# Patient Record
Sex: Female | Born: 1935 | Race: White | Hispanic: No | Smoking: Former smoker
Health system: Southern US, Community
[De-identification: ages and names within clinical notes are randomized; demographics above are authoritative.]

## PROBLEM LIST (undated history)

## (undated) DIAGNOSIS — E119 Type 2 diabetes mellitus without complications: Secondary | ICD-10-CM

## (undated) DIAGNOSIS — S72141A Displaced intertrochanteric fracture of right femur, initial encounter for closed fracture: Secondary | ICD-10-CM

## (undated) DIAGNOSIS — I340 Nonrheumatic mitral (valve) insufficiency: Secondary | ICD-10-CM

## (undated) DIAGNOSIS — R001 Bradycardia, unspecified: Secondary | ICD-10-CM

## (undated) DIAGNOSIS — T8859XA Other complications of anesthesia, initial encounter: Secondary | ICD-10-CM

## (undated) DIAGNOSIS — I714 Abdominal aortic aneurysm, without rupture, unspecified: Secondary | ICD-10-CM

## (undated) DIAGNOSIS — I2510 Atherosclerotic heart disease of native coronary artery without angina pectoris: Secondary | ICD-10-CM

## (undated) DIAGNOSIS — R55 Syncope and collapse: Secondary | ICD-10-CM

## (undated) DIAGNOSIS — L97909 Non-pressure chronic ulcer of unspecified part of unspecified lower leg with unspecified severity: Secondary | ICD-10-CM

## (undated) DIAGNOSIS — J449 Chronic obstructive pulmonary disease, unspecified: Secondary | ICD-10-CM

## (undated) DIAGNOSIS — Z720 Tobacco use: Secondary | ICD-10-CM

## (undated) DIAGNOSIS — I719 Aortic aneurysm of unspecified site, without rupture: Secondary | ICD-10-CM

## (undated) DIAGNOSIS — T4145XA Adverse effect of unspecified anesthetic, initial encounter: Secondary | ICD-10-CM

## (undated) DIAGNOSIS — T148XXA Other injury of unspecified body region, initial encounter: Secondary | ICD-10-CM

## (undated) DIAGNOSIS — K922 Gastrointestinal hemorrhage, unspecified: Secondary | ICD-10-CM

## (undated) DIAGNOSIS — E871 Hypo-osmolality and hyponatremia: Secondary | ICD-10-CM

## (undated) DIAGNOSIS — Z951 Presence of aortocoronary bypass graft: Secondary | ICD-10-CM

## (undated) DIAGNOSIS — I5030 Unspecified diastolic (congestive) heart failure: Secondary | ICD-10-CM

## (undated) DIAGNOSIS — I10 Essential (primary) hypertension: Secondary | ICD-10-CM

## (undated) DIAGNOSIS — I878 Other specified disorders of veins: Secondary | ICD-10-CM

## (undated) DIAGNOSIS — I482 Chronic atrial fibrillation, unspecified: Secondary | ICD-10-CM

## (undated) DIAGNOSIS — A159 Respiratory tuberculosis unspecified: Secondary | ICD-10-CM

## (undated) DIAGNOSIS — I739 Peripheral vascular disease, unspecified: Secondary | ICD-10-CM

## (undated) DIAGNOSIS — E785 Hyperlipidemia, unspecified: Secondary | ICD-10-CM

## (undated) DIAGNOSIS — I779 Disorder of arteries and arterioles, unspecified: Secondary | ICD-10-CM

## (undated) HISTORY — DX: Aortic aneurysm of unspecified site, without rupture: I71.9

## (undated) HISTORY — DX: Unspecified diastolic (congestive) heart failure: I50.30

## (undated) HISTORY — DX: Essential (primary) hypertension: I10

## (undated) HISTORY — DX: Non-pressure chronic ulcer of unspecified part of unspecified lower leg with unspecified severity: L97.909

## (undated) HISTORY — PX: OTHER SURGICAL HISTORY: SHX169

## (undated) HISTORY — DX: Tobacco use: Z72.0

## (undated) HISTORY — DX: Presence of aortocoronary bypass graft: Z95.1

## (undated) HISTORY — DX: Disorder of arteries and arterioles, unspecified: I77.9

## (undated) HISTORY — DX: Respiratory tuberculosis unspecified: A15.9

## (undated) HISTORY — DX: Hypo-osmolality and hyponatremia: E87.1

## (undated) HISTORY — DX: Bradycardia, unspecified: R00.1

## (undated) HISTORY — DX: Hyperlipidemia, unspecified: E78.5

## (undated) HISTORY — DX: Nonrheumatic mitral (valve) insufficiency: I34.0

## (undated) HISTORY — DX: Syncope and collapse: R55

## (undated) HISTORY — PX: CHOLECYSTECTOMY: SHX55

## (undated) HISTORY — DX: Atherosclerotic heart disease of native coronary artery without angina pectoris: I25.10

## (undated) HISTORY — PX: ABDOMINAL HYSTERECTOMY: SHX81

## (undated) HISTORY — DX: Chronic obstructive pulmonary disease, unspecified: J44.9

## (undated) HISTORY — DX: Gastrointestinal hemorrhage, unspecified: K92.2

## (undated) HISTORY — DX: Other injury of unspecified body region, initial encounter: T14.8XXA

## (undated) HISTORY — DX: Peripheral vascular disease, unspecified: I73.9

---

## 1999-06-18 ENCOUNTER — Ambulatory Visit (HOSPITAL_COMMUNITY): Admission: RE | Admit: 1999-06-18 | Discharge: 1999-06-18 | Payer: Self-pay | Admitting: Gastroenterology

## 2002-12-25 ENCOUNTER — Inpatient Hospital Stay (HOSPITAL_COMMUNITY): Admission: EM | Admit: 2002-12-25 | Discharge: 2002-12-31 | Payer: Self-pay | Admitting: Emergency Medicine

## 2002-12-25 ENCOUNTER — Encounter: Payer: Self-pay | Admitting: Emergency Medicine

## 2002-12-27 ENCOUNTER — Encounter: Payer: Self-pay | Admitting: Cardiology

## 2003-04-02 ENCOUNTER — Ambulatory Visit (HOSPITAL_COMMUNITY): Admission: RE | Admit: 2003-04-02 | Discharge: 2003-04-02 | Payer: Self-pay | Admitting: Gastroenterology

## 2003-04-28 HISTORY — PX: CORONARY ARTERY BYPASS GRAFT: SHX141

## 2003-05-18 ENCOUNTER — Inpatient Hospital Stay (HOSPITAL_COMMUNITY): Admission: EM | Admit: 2003-05-18 | Discharge: 2003-06-02 | Payer: Self-pay | Admitting: Emergency Medicine

## 2003-08-21 ENCOUNTER — Inpatient Hospital Stay (HOSPITAL_BASED_OUTPATIENT_CLINIC_OR_DEPARTMENT_OTHER): Admission: RE | Admit: 2003-08-21 | Discharge: 2003-08-21 | Payer: Self-pay | Admitting: Cardiology

## 2003-08-27 ENCOUNTER — Ambulatory Visit (HOSPITAL_COMMUNITY): Admission: RE | Admit: 2003-08-27 | Discharge: 2003-08-27 | Payer: Self-pay | Admitting: Cardiology

## 2003-10-15 ENCOUNTER — Ambulatory Visit (HOSPITAL_COMMUNITY): Admission: RE | Admit: 2003-10-15 | Discharge: 2003-10-15 | Payer: Self-pay | Admitting: Family Medicine

## 2004-04-07 ENCOUNTER — Inpatient Hospital Stay (HOSPITAL_COMMUNITY): Admission: EM | Admit: 2004-04-07 | Discharge: 2004-04-08 | Payer: Self-pay | Admitting: Emergency Medicine

## 2004-04-07 ENCOUNTER — Ambulatory Visit: Payer: Self-pay | Admitting: Cardiology

## 2004-04-23 ENCOUNTER — Ambulatory Visit: Payer: Self-pay

## 2004-05-05 ENCOUNTER — Ambulatory Visit: Payer: Self-pay | Admitting: Cardiology

## 2005-01-14 ENCOUNTER — Ambulatory Visit: Payer: Self-pay

## 2005-06-03 ENCOUNTER — Ambulatory Visit: Payer: Self-pay | Admitting: Cardiology

## 2005-06-14 ENCOUNTER — Ambulatory Visit: Payer: Self-pay | Admitting: Cardiology

## 2005-06-15 ENCOUNTER — Inpatient Hospital Stay (HOSPITAL_COMMUNITY): Admission: EM | Admit: 2005-06-15 | Discharge: 2005-06-15 | Payer: Self-pay | Admitting: Emergency Medicine

## 2005-06-19 ENCOUNTER — Ambulatory Visit: Payer: Self-pay

## 2005-06-19 ENCOUNTER — Ambulatory Visit: Payer: Self-pay | Admitting: Cardiology

## 2005-06-30 ENCOUNTER — Ambulatory Visit: Payer: Self-pay | Admitting: Cardiology

## 2006-01-20 ENCOUNTER — Ambulatory Visit: Payer: Self-pay

## 2006-04-27 DIAGNOSIS — K922 Gastrointestinal hemorrhage, unspecified: Secondary | ICD-10-CM

## 2006-04-27 HISTORY — DX: Gastrointestinal hemorrhage, unspecified: K92.2

## 2006-08-17 ENCOUNTER — Ambulatory Visit: Payer: Self-pay | Admitting: Cardiology

## 2006-10-28 ENCOUNTER — Ambulatory Visit: Payer: Self-pay | Admitting: Gastroenterology

## 2006-11-11 ENCOUNTER — Ambulatory Visit: Payer: Self-pay | Admitting: Gastroenterology

## 2006-12-30 ENCOUNTER — Ambulatory Visit: Payer: Self-pay

## 2006-12-30 ENCOUNTER — Ambulatory Visit: Payer: Self-pay | Admitting: Cardiology

## 2007-01-05 ENCOUNTER — Encounter: Admission: RE | Admit: 2007-01-05 | Discharge: 2007-01-05 | Payer: Self-pay | Admitting: Family Medicine

## 2007-01-06 ENCOUNTER — Encounter: Admission: RE | Admit: 2007-01-06 | Discharge: 2007-01-06 | Payer: Self-pay | Admitting: Family Medicine

## 2007-01-23 ENCOUNTER — Inpatient Hospital Stay (HOSPITAL_COMMUNITY): Admission: EM | Admit: 2007-01-23 | Discharge: 2007-01-24 | Payer: Self-pay | Admitting: Emergency Medicine

## 2007-01-23 ENCOUNTER — Ambulatory Visit: Payer: Self-pay | Admitting: Cardiology

## 2007-01-28 ENCOUNTER — Ambulatory Visit: Payer: Self-pay

## 2007-02-02 ENCOUNTER — Ambulatory Visit: Payer: Self-pay | Admitting: Internal Medicine

## 2007-02-04 ENCOUNTER — Encounter (INDEPENDENT_AMBULATORY_CARE_PROVIDER_SITE_OTHER): Payer: Self-pay | Admitting: General Surgery

## 2007-02-05 ENCOUNTER — Inpatient Hospital Stay (HOSPITAL_COMMUNITY): Admission: RE | Admit: 2007-02-05 | Discharge: 2007-02-09 | Payer: Self-pay | Admitting: General Surgery

## 2007-02-15 ENCOUNTER — Ambulatory Visit: Payer: Self-pay | Admitting: Gastroenterology

## 2007-08-20 ENCOUNTER — Ambulatory Visit: Payer: Self-pay | Admitting: Internal Medicine

## 2007-08-20 ENCOUNTER — Inpatient Hospital Stay (HOSPITAL_COMMUNITY): Admission: EM | Admit: 2007-08-20 | Discharge: 2007-08-23 | Payer: Self-pay | Admitting: Emergency Medicine

## 2007-08-29 ENCOUNTER — Ambulatory Visit: Payer: Self-pay | Admitting: Cardiology

## 2007-08-29 LAB — CONVERTED CEMR LAB
BUN: 10 mg/dL (ref 6–23)
CO2: 30 meq/L (ref 19–32)
Calcium: 9.9 mg/dL (ref 8.4–10.5)
Chloride: 91 meq/L — ABNORMAL LOW (ref 96–112)
Creatinine, Ser: 0.8 mg/dL (ref 0.4–1.2)
GFR calc Af Amer: 90 mL/min
GFR calc non Af Amer: 74 mL/min
Glucose, Bld: 94 mg/dL (ref 70–99)
Potassium: 4.3 meq/L (ref 3.5–5.1)
Sodium: 129 meq/L — ABNORMAL LOW (ref 135–145)

## 2007-10-13 ENCOUNTER — Ambulatory Visit: Payer: Self-pay | Admitting: Cardiology

## 2007-10-13 LAB — CONVERTED CEMR LAB
BUN: 14 mg/dL (ref 6–23)
CO2: 31 meq/L (ref 19–32)
Calcium: 10.1 mg/dL (ref 8.4–10.5)
Chloride: 97 meq/L (ref 96–112)
Creatinine, Ser: 0.7 mg/dL (ref 0.4–1.2)
GFR calc Af Amer: 105 mL/min
GFR calc non Af Amer: 86 mL/min
Glucose, Bld: 99 mg/dL (ref 70–99)
Potassium: 3.3 meq/L — ABNORMAL LOW (ref 3.5–5.1)
Sodium: 135 meq/L (ref 135–145)

## 2007-12-21 ENCOUNTER — Ambulatory Visit: Payer: Self-pay | Admitting: Cardiology

## 2008-01-27 ENCOUNTER — Ambulatory Visit: Payer: Self-pay

## 2008-02-08 ENCOUNTER — Ambulatory Visit: Payer: Self-pay | Admitting: Cardiology

## 2008-04-04 ENCOUNTER — Ambulatory Visit: Payer: Self-pay | Admitting: Cardiology

## 2008-10-24 ENCOUNTER — Encounter: Payer: Self-pay | Admitting: Cardiology

## 2008-10-25 ENCOUNTER — Ambulatory Visit: Payer: Self-pay | Admitting: Cardiology

## 2008-11-08 ENCOUNTER — Ambulatory Visit: Payer: Self-pay

## 2008-11-08 ENCOUNTER — Encounter: Payer: Self-pay | Admitting: Cardiology

## 2009-01-31 ENCOUNTER — Ambulatory Visit: Payer: Self-pay

## 2009-01-31 ENCOUNTER — Encounter: Payer: Self-pay | Admitting: Cardiology

## 2009-04-28 ENCOUNTER — Encounter: Payer: Self-pay | Admitting: Cardiology

## 2009-04-29 ENCOUNTER — Ambulatory Visit: Payer: Self-pay | Admitting: Cardiology

## 2009-08-02 ENCOUNTER — Ambulatory Visit: Payer: Self-pay | Admitting: Pulmonary Disease

## 2009-08-02 DIAGNOSIS — Z9981 Dependence on supplemental oxygen: Secondary | ICD-10-CM

## 2009-08-05 ENCOUNTER — Encounter: Payer: Self-pay | Admitting: Pulmonary Disease

## 2009-08-09 ENCOUNTER — Encounter: Payer: Self-pay | Admitting: Pulmonary Disease

## 2009-08-19 ENCOUNTER — Ambulatory Visit: Payer: Self-pay | Admitting: Pulmonary Disease

## 2009-09-03 ENCOUNTER — Ambulatory Visit: Payer: Self-pay | Admitting: Pulmonary Disease

## 2009-11-07 ENCOUNTER — Ambulatory Visit: Payer: Self-pay | Admitting: Cardiology

## 2009-11-12 ENCOUNTER — Telehealth: Payer: Self-pay | Admitting: Cardiology

## 2009-11-12 LAB — CONVERTED CEMR LAB
BUN: 14 mg/dL (ref 6–23)
Basophils Absolute: 0 10*3/uL (ref 0.0–0.1)
Basophils Relative: 0.6 % (ref 0.0–3.0)
CO2: 34 meq/L — ABNORMAL HIGH (ref 19–32)
Calcium: 10.1 mg/dL (ref 8.4–10.5)
Chloride: 88 meq/L — ABNORMAL LOW (ref 96–112)
Creatinine, Ser: 0.8 mg/dL (ref 0.4–1.2)
Eosinophils Absolute: 0.3 10*3/uL (ref 0.0–0.7)
Eosinophils Relative: 3.3 % (ref 0.0–5.0)
GFR calc non Af Amer: 75.88 mL/min (ref >60)
Glucose, Bld: 85 mg/dL (ref 70–99)
HCT: 38 % (ref 36.0–46.0)
Hemoglobin: 13.2 g/dL (ref 12.0–15.0)
Lymphocytes Relative: 14.1 % (ref 12.0–46.0)
Lymphs Abs: 1.1 10*3/uL (ref 0.7–4.0)
MCHC: 34.8 g/dL (ref 30.0–36.0)
MCV: 91.3 fL (ref 78.0–100.0)
Monocytes Absolute: 0.8 10*3/uL (ref 0.1–1.0)
Monocytes Relative: 9.4 % (ref 3.0–12.0)
Neutro Abs: 5.8 10*3/uL (ref 1.4–7.7)
Neutrophils Relative %: 72.6 % (ref 43.0–77.0)
Platelets: 232 10*3/uL (ref 150.0–400.0)
Potassium: 3.6 meq/L (ref 3.5–5.1)
RBC: 4.16 M/uL (ref 3.87–5.11)
RDW: 13.6 % (ref 11.5–14.6)
Sodium: 128 meq/L — ABNORMAL LOW (ref 135–145)
WBC: 8 10*3/uL (ref 4.5–10.5)

## 2010-01-01 ENCOUNTER — Ambulatory Visit: Payer: Self-pay | Admitting: Pulmonary Disease

## 2010-01-10 ENCOUNTER — Telehealth: Payer: Self-pay | Admitting: Adult Health

## 2010-02-06 ENCOUNTER — Encounter: Payer: Self-pay | Admitting: Cardiology

## 2010-02-07 ENCOUNTER — Encounter: Payer: Self-pay | Admitting: Cardiology

## 2010-02-07 ENCOUNTER — Ambulatory Visit: Payer: Self-pay

## 2010-04-30 ENCOUNTER — Encounter: Payer: Self-pay | Admitting: Cardiology

## 2010-05-01 ENCOUNTER — Telehealth: Payer: Self-pay | Admitting: Cardiology

## 2010-05-01 ENCOUNTER — Ambulatory Visit
Admission: RE | Admit: 2010-05-01 | Discharge: 2010-05-01 | Payer: Self-pay | Source: Home / Self Care | Attending: Cardiology | Admitting: Cardiology

## 2010-05-02 ENCOUNTER — Ambulatory Visit
Admission: RE | Admit: 2010-05-02 | Discharge: 2010-05-02 | Payer: Self-pay | Source: Home / Self Care | Attending: Pulmonary Disease | Admitting: Pulmonary Disease

## 2010-05-27 NOTE — Miscellaneous (Signed)
  Clinical Lists Changes  Medications: Changed medication from ICAPS MV  TABS (MULTIPLE VITAMINS-MINERALS) Take one tablet by mouth twice daily. to OCUVITE  TABS (MULTIPLE VITAMINS-MINERALS) Take 1 tablet by mouth once a day - Signed Changed medication from METOPROLOL TARTRATE 100 MG TABS (METOPROLOL TARTRATE) Take one tablet by mouth twice a day to METOPROLOL TARTRATE 100 MG TABS (METOPROLOL TARTRATE) Take 2 tablet by mouth two times a day - Signed Added new medication of HYDROCHLOROTHIAZIDE 50 MG TABS (HYDROCHLOROTHIAZIDE) Take 1/2 tablet by mouth once a day - Signed Added new medication of MULTIVITAMINS   TABS (MULTIPLE VITAMIN) Take 1 tablet by mouth once a day - Signed Added new medication of FERROUS SULFATE 325 (65 FE) MG TABS (FERROUS SULFATE) Take 1 tablet by mouth once a day - Signed Removed medication of PROMETHAZINE HCL 25 MG TABS (PROMETHAZINE HCL) as needed Added new medication of * IPRATROPIUM BROMIDE NASAL SOLUTION 2 sprays as needed

## 2010-05-27 NOTE — Assessment & Plan Note (Signed)
Summary: 4 months/apc   Copy to:  Self referred Primary Provider/Referring Provider:  Windle Guard, MD  CC:  follow up .  History of Present Illness: 80/f, smoker presents for management of COPD. She has been on home O2 since dec 2010 ([facility]) after a chest cold requiring prednisone, CXR then showed hyperinflation. She smokes 1/2 PPD, about 22 Pyrs - chantix made her sick & she is afraid of cardiac side effects with patches. She sees Dr Myrtis Ser for CAD & has tolerated lsisnopril & metoprolol, echo 7/10 showed nml LV fn & non dilated RV. An episode of syncope in 2009 was attributed to NTG & diuretics. She reports clear phlegm, nasal drip & sneezing - no seasonal allergies, nasal atrovent dries her out. She has lt >> Rt pedal edema Desaturated to 86% onRA with minimal ambulation. reviewed PFTs 08/19/09 >> severe airway obstruction, FEV1 47%, no BD response, air trapping +, severe decrease in diffusion  Sep 03, 2009 2:00 PM  smokes 1/2 PPD , symbicort may have helped some. -compliant with O2  January 01, 2010 --Presents for 4 mth rov per Dr Vassie Loll -  Continues on Symbicort and Spiriva. Has cut back on cigs-8 /daily. No flare in breathing. Has daily cough mainly dry, worse in am. No discolored mucus, no wheezing. Has good/and bad days. Denies chest pain, , orthopnea, hemoptysis, fever, n/v/d, edema, headache.   Current Medications (verified): 1)  Nitroglycerin 0.4 Mg Subl (Nitroglycerin) .... Place 1 Tablet Under Tongue As Directed 2)  Ocuvite  Tabs (Multiple Vitamins-Minerals) .... Take 1 Tablet By Mouth Once A Day 3)  Aspirin 81 Mg Tbec (Aspirin) .... Take One Tablet By Mouth Daily 4)  Calcium-Carb 600 + D 600-125 Mg-Unit Tabs (Calcium-Vitamin D) .... Take One Tablet By Mouth Once Daily. 5)  Proventil Hfa 108 (90 Base) Mcg/act Aers (Albuterol Sulfate) .... Uad Prn 6)  Spiriva Handihaler 18 Mcg Caps (Tiotropium Bromide Monohydrate) .... Uad Prn 7)  Vitamin D (Ergocalciferol) 50000 Unit Caps  (Ergocalciferol) .... Q Weekly 8)  Verapamil Hcl Cr 180 Mg Xr24h-Cap (Verapamil Hcl) .... Take One Capsule By Mouth Twice A Day 9)  Lisinopril 40 Mg Tabs (Lisinopril) .... Take One Tablet By Mouth Daily 10)  Metoprolol Tartrate 100 Mg Tabs (Metoprolol Tartrate) .... Take 2 Tablet By Mouth Two Times A Day 11)  Potassium Chloride Crys Cr 20 Meq Cr-Tabs (Potassium Chloride Crys Cr) .... Take One Tablet By Mouth Twice A Day 12)  Miralax  Powd (Polyethylene Glycol 3350) .... Uad Prn 13)  Ibuprofen 200 Mg Tabs (Ibuprofen) .... As Needed 14)  Magnesium Oxide 420 Mg Tabs (Magnesium Oxide) .... Once Daily 15)  Lovastatin 40 Mg Tabs (Lovastatin) .... Take 1/2 Tablet By Mouth Once A Day 16)  O2 2 L/min - [facility] 17)  Hydrochlorothiazide 50 Mg Tabs (Hydrochlorothiazide) .... Take 1/2 Tablet By Mouth Once A Day 18)  Multivitamins   Tabs (Multiple Vitamin) .... Take 1 Tablet By Mouth Once A Day 19)  Ferrous Sulfate 325 (65 Fe) Mg Tabs (Ferrous Sulfate) .... Take 1 Tablet By Mouth Once A Day 20)  Ipratropium Bromide Nasal Solution .... 2 Sprays As Needed 21)  Nicotrol 10 Mg Inha (Nicotine) .... Use As Directed 22)  Symbicort 160-4.5 Mcg/act Aero (Budesonide-Formoterol Fumarate) .... Take 2 Puffs Two Times A Day and Rinse Mouth After Each Use  Allergies (verified): 1)  ! Penicillin 2)  ! Codeine 3)  ! Zantac  Comments:  Nurse/Medical Assistant: The patient's medications and allergies were reviewed  with the patient and were updated in the Medication and Allergy Lists.  Past History:  Past Medical History: Last updated: 11/07/2009 CAD CABG 2005 EF 60-65%...echo.Marland Kitchen.10/2008 Aortic valve sclerosis, but no stenosis Hypertension Hyperlipidemia G I Bleed  (AVMs) Aneurysm-Thoracic Aortic (Ascending) (There was question of this in the past.  Echo.. July, 2010... reveals no evidence of significant dilatation of the aorta.... no further workup needed) Syncope (NTG & diuretic) Hyponatremia   COPD Smoking bradycardia Carotid artery disease...doppler 01/2009...0-39% RICA, 40-59% LICA Cholecystectomy Syncope.Marland KitchenMarland Kitchen4/2009.Marland Kitchen.?diuretics and Ntg and bradycardia ? Hyponatremia **Exposed to Tuberculosis in the 1970's, was tested negative by [facility]  Past Surgical History: Last updated: 08/02/2009 Carotid Endarterectomy Cholecystectomy Hysterectomy  Family History: Last updated: 08/02/2009 CHF-mother Family History Lung Cancer-father (non-smoker), sister Family History Prostate Cancer-brother Family History Breast Cancer-sister  Social History: Last updated: 08/02/2009 Marital Status: Widowed, lives alone Children: yes, 1 living Occupation: Retired from [employer] Patient is a current smoker. (1/2 ppd)  Review of Systems      See HPI  Vital Signs:  Patient profile:   77 year old female Weight:      183.13 pounds O2 Sat:      96 % on 2 L/min Temp:     97.7 degrees F oral Pulse rate:   52 / minute BP sitting:   124 / 62  (left arm) Cuff size:   regular  Vitals Entered By: Abigail Miyamoto RN (January 01, 2010 11:33 AM)  O2 Flow:  2 L/min  Physical Exam  Additional Exam:  Gen. Pleasant, thin woman, in no distress, normal affect ENT - no lesions, no post nasal drip Neck: No JVD, no thyromegaly, no carotid bruits Lungs: no use of accessory muscles, no dullness to percussion, decreased BL without rales or rhonchi  Cardiovascular: Rhythm regular, heart sounds  normal, no murmurs or gallops, 1+ peripheral edema Musculoskeletal: No deformities, no cyanosis or clubbing      Impression & Recommendations:  Problem # 1:  COPD (ICD-496)  Please schedule a follow-up appointment in 4 months with Dr. Vassie Loll  Keep trying to cut down with patches & nicotine inhaler Continue on Spiriva and Symbicort  Please contact office for sooner follow up as needed  Flu shot  Her updated medication list for this problem includes:    Nicotrol 10 Mg Inha (Nicotine) ..... Use as  directed  Orders: Est. Patient Level III (16109)  Medications Added to Medication List This Visit: 1)  Symbicort 160-4.5 Mcg/act Aero (Budesonide-formoterol fumarate) .... Take 2 puffs two times a day and rinse mouth after each use  Other Orders: Flu Vaccine 48yrs + (60454) Administration Flu vaccine - MCR (U9811)  Patient Instructions: 1)  Please schedule a follow-up appointment in 4 months with Dr. Vassie Loll  2)  Keep trying to cut down with patches & nicotine inhaler 3)  Continue on Spiriva and Symbicort  4)  Please contact office for sooner follow up as needed  5)  Flu shot  Prescriptions: PROVENTIL HFA 108 (90 BASE) MCG/ACT AERS (ALBUTEROL SULFATE) UAD prn  #3 x 3   Entered and Authorized by:   Rubye Oaks NP   Signed by:   Tammy Parrett NP on 01/01/2010   Method used:   Faxed to ...       [facility] (mail-order)             , [place]         Ph:        Fax: [phone]   RxID:  [id number]   Flu Vaccine Consent Questions     Do you have a history of severe allergic reactions to this vaccine? no    Any prior history of allergic reactions to egg and/or gelatin? no    Do you have a sensitivity to the preservative Thimersol? no    Do you have a past history of Guillan-Barre Syndrome? no    Do you currently have an acute febrile illness? no    Have you ever had a severe reaction to latex? no    Vaccine information given and explained to patient? yes    Are you currently pregnant? no    Lot Number:[id number]   Exp Date:10/24/2009   Site Given  Left Deltoid IMDenise Doroteo Glassman RN  January 01, 2010 11:45 AM      .lbmedflu

## 2010-05-27 NOTE — Miscellaneous (Signed)
Summary: Orders Update pft charges  Clinical Lists Changes  Orders: Added new Service order of Carbon Monoxide diffusing w/capacity (94720) - Signed Added new Service order of Lung Volumes (94240) - Signed Added new Service order of Spirometry (Pre & Post) (94060) - Signed 

## 2010-05-27 NOTE — Assessment & Plan Note (Signed)
Summary: [name]   Visit Type:  Follow-up Primary [name]:  Windle Guard, MD  CC:  CAD.  History of Present Illness: Patient is seen for followup of coronary disease.  I saw her last January, 2011.  She underwent CABG in 2005.  Ejection fraction was 60% in 2010.  She's not having any chest pain.  She is wearing her home O2.  She continues to smoke a small amount.  Current Medications (verified): 1)  Nitroglycerin 0.4 Mg Subl (Nitroglycerin) .... Place 1 Tablet Under Tongue As Directed 2)  Ocuvite  Tabs (Multiple Vitamins-Minerals) .... Take 1 Tablet By Mouth Once A Day 3)  Aspirin 81 Mg Tbec (Aspirin) .... Take One Tablet By Mouth Daily 4)  Calcium-Carb 600 + D 600-125 Mg-Unit Tabs (Calcium-Vitamin D) .... Take One Tablet By Mouth Once Daily. 5)  Proventil Hfa 108 (90 Base) Mcg/act Aers (Albuterol Sulfate) .... Uad Prn 6)  Spiriva Handihaler 18 Mcg Caps (Tiotropium Bromide Monohydrate) .... Uad Prn 7)  Vitamin D (Ergocalciferol) 50000 Unit Caps (Ergocalciferol) .... Q Weekly 8)  Verapamil Hcl Cr 180 Mg Xr24h-Cap (Verapamil Hcl) .... Take One Capsule By Mouth Twice A Day 9)  Lisinopril 40 Mg Tabs (Lisinopril) .... Take One Tablet By Mouth Daily 10)  Metoprolol Tartrate 100 Mg Tabs (Metoprolol Tartrate) .... Take 2 Tablet By Mouth Two Times A Day 11)  Potassium Chloride Crys Cr 20 Meq Cr-Tabs (Potassium Chloride Crys Cr) .... Take One Tablet By Mouth Twice A Day 12)  Miralax  Powd (Polyethylene Glycol 3350) .... Uad Prn 13)  Ibuprofen 200 Mg Tabs (Ibuprofen) .... As Needed 14)  Magnesium Oxide 420 Mg Tabs (Magnesium Oxide) .... Once Daily 15)  Lovastatin 40 Mg Tabs (Lovastatin) .... Take 1/2 Tablet By Mouth Once A Day 16)  O2 2 L/min - [facility] 17)  Hydrochlorothiazide 50 Mg Tabs (Hydrochlorothiazide) .... Take 1/2 Tablet By Mouth Once A Day 18)  Multivitamins   Tabs (Multiple Vitamin) .... Take 1 Tablet By Mouth Once A Day 19)  Ferrous Sulfate 325 (65 Fe) Mg Tabs (Ferrous Sulfate) .... Take  1 Tablet By Mouth Once A Day 20)  Ipratropium Bromide Nasal Solution .... 2 Sprays As Needed 21)  Symbicort 160-4.5 Mcg/act Aero (Budesonide-Formoterol Fumarate) .... Inhale 2 Puffs Two Times A Day 22)  Nicotrol 10 Mg Inha (Nicotine) .... Use As Directed  Allergies (verified): 1)  ! Penicillin 2)  ! Codeine 3)  ! Zantac  Past History:  Past Medical History: CAD CABG 2005 EF 60-65%...echo.[name].10/2008 Aortic valve sclerosis, but no stenosis Hypertension Hyperlipidemia G I Bleed  (AVMs) Aneurysm-Thoracic Aortic (Ascending) (There was question of this in the past.  Echo.. July, 2010... reveals no evidence of significant dilatation of the aorta.... no further workup needed) Syncope (NTG & diuretic) Hyponatremia  COPD Smoking bradycardia Carotid artery disease...doppler 01/2009...0-39% RICA, 40-59% LICA Cholecystectomy Syncope.Karen KitchenMarland Kitchen4/2009.[name].?diuretics and Ntg and bradycardia ? Hyponatremia **Exposed to Tuberculosis in the 1970's, was tested negative by [facility]  Review of Systems       Patient denies fever, chills, headache, sweats, rash, change in vision, change in hearing, chest pain, cough, nausea vomiting, urinary symptoms.  All other systems are reviewed and are negative  Vital Signs:  Patient profile:   77 year old female Height:      72 inches Weight:      185 pounds BMI:     25.18 Pulse rate:   55 / minute Resp:     18 per minute BP sitting:   174 /  82  (left arm)  Vitals Entered By: Marrion Coy, CNA (November 07, 2009 1:48 PM)  Physical Exam  General:  patient is stable wearing oxygen. Head:  head is atraumatic. Eyes:  no xanthelasma. Neck:  no jugular venous distention. Chest Wall:  no chest wall tenderness. Lungs:  lungs are clear.  Respiratory effort is nonlabored. Heart:  cardiac exam reveals S1-S2.  No clicks or significant murmurs. Abdomen:  abdomen is soft. Msk:  there is mild kyphosis. Extremities:  no peripheral edema. Skin:  patient has chronic  venous changes in her legs. Psych:  patient is oriented to person time and place.  Affect is normal.   Impression & Recommendations:  Problem # 1:  OXYGEN-USE OF SUPPLEMENTAL (ICD-V46.2) The patient uses her home oxygen.  I have urged her to stop smoking.  She says she is trying.  Problem # 2:  EDEMA (ICD-782.3) There is no significant edema at this time.  No change in therapy.  Problem # 3:  SINUS BRADYCARDIA (ICD-427.81) the patient has mild persistent sinus bradycardia.  She is not symptomatic.  EKG today reveals sinus bradycardia with a rate of 55.  No change in therapy.  Problem # 4:  CAROTID ARTERY DISEASE (ICD-433.10) The patient's carotids were studied by Doppler in October, 2010.  There will be one year followup study.  Problem # 5:  TOBACCO ABUSE (ICD-305.1) I have urged the patient to stop smoking.  Problem # 6:  SYNCOPE, HX OF (ICD-V12.49) There's been no recurrent syncope.  Problem # 7:  THORACIC AORTIC ANEURYSM (ICD-441.2) I have reviewed old records concerning the question of an aortic aneurysm.  Echo in 2010 showed no evidence of an ascending aneurysm.  No further workup needed  Problem # 8:  HYPERTENSION (ICD-401.9)  Systolic blood pressure is elevated today.  I will arrange for followup blood pressures.  Orders: EKG w/ Interpretation (93000) TLB-BMP (Basic Metabolic Panel-BMET) (80048-METABOL) TLB-CBC Platelet - w/Differential (85025-CBCD)  Problem # 9:  Hx of GI BLEEDING (ICD-578.9) There is a history of GI bleeding.  We will check her CBC today.  Problem # 10:  CAD (ICD-414.00) Coronary disease is stable.  EKG is done today and reviewed by me.  There is a Q wave in lead 3.  I doubt this represents a significant change. I will see her back in 6 months.  Patient Instructions: 1)  Labs today 2)  Your physician has requested that you regularly monitor and record your blood pressure readings at home.  Please use the same machine at the same time of day to  check your readings and give [place] a call with some readings. 3)  Your physician wants you to follow-up in:  6 months.  You will receive a reminder letter in the mail two months in advance. If you don't receive a letter, please call our office to schedule the follow-up appointment.

## 2010-05-27 NOTE — Letter (Signed)
Summary: CMN/[facility]  CMN/[facility]   Imported By: Lester Sidney 08/15/2009 09:06:48  _____________________________________________________________________  External Attachment:    Type:   Image     Comment:   External Document

## 2010-05-27 NOTE — Miscellaneous (Signed)
Summary: Orders Update  Clinical Lists Changes  Orders: Added new Test order of Carotid Duplex (Carotid Duplex) - Signed 

## 2010-05-27 NOTE — Progress Notes (Signed)
Summary: BP readings  Phone Note Outgoing Call   Call placed by: Meredith Staggers, RN,  November 12, 2009 11:55 AM Call placed to: Patient Summary of Call: called pt to let her know labwork was ok, she states she has been checking her BP at home and it is running high 179/69, 185/78, 154/78 she will cont to monitor, will send to Dr Myrtis Ser for reveiw   Follow-up for Phone Call        continue to follow for 2 more weeks.  Talitha Givens, MD, [facility]  November 13, 2009 6:23 PM  pt is aware, she will c/b middle of Aug w/more readings Meredith Staggers, RN  November 14, 2009 5:41 PM

## 2010-05-27 NOTE — Assessment & Plan Note (Signed)
Summary: per chekc out/sf   Visit Type:  Follow-up Primary Provider:  Windle Guard, MD  CC:  CAD.  History of Present Illness: The patient is stable from the cardiac viewpoint.  She had a recent upper respiratory infection with some prednisone treatment and is now resolved.  She did not have any chest pain.  She mentions some slight edema around the time of her prednisone therapy.  This is now improved.  Current Medications (verified): 1)  Nitroglycerin 0.4 Mg Subl (Nitroglycerin) .... Place 1 Tablet Under Tongue As Directed 2)  Icaps Mv  Tabs (Multiple Vitamins-Minerals) .... Take One Tablet By Mouth Twice Daily. 3)  Aspirin 81 Mg Tbec (Aspirin) .... Take One Tablet By Mouth Daily 4)  Calcium-Carb 600 + D 600-125 Mg-Unit Tabs (Calcium-Vitamin D) .... Take One Tablet By Mouth Once Daily. 5)  Proventil Hfa 108 (90 Base) Mcg/act Aers (Albuterol Sulfate) .... Uad Prn 6)  Spiriva Handihaler 18 Mcg Caps (Tiotropium Bromide Monohydrate) .... Uad Prn 7)  Vitamin D (Ergocalciferol) 50000 Unit Caps (Ergocalciferol) .... Q Weekly 8)  Verapamil Hcl Cr 180 Mg Xr24h-Cap (Verapamil Hcl) .... Take One Capsule By Mouth Twice A Day 9)  Lisinopril 40 Mg Tabs (Lisinopril) .... Take One Tablet By Mouth Daily 10)  Metoprolol Tartrate 100 Mg Tabs (Metoprolol Tartrate) .... Take One Tablet By Mouth Twice A Day 11)  Potassium Chloride Crys Cr 20 Meq Cr-Tabs (Potassium Chloride Crys Cr) .... Take One Tablet By Mouth Twice A Day 12)  Miralax  Powd (Polyethylene Glycol 3350) .... Uad Prn 13)  Ibuprofen 200 Mg Tabs (Ibuprofen) .... As Needed 14)  Promethazine Hcl 25 Mg Tabs (Promethazine Hcl) .... As Needed 15)  Magnesium Oxide 420 Mg Tabs (Magnesium Oxide) .... Once Daily 16)  Prednisone 20 Mg Tabs (Prednisone) .... Finish The Last One Todeay 17)  Lovastatin 20 Mg Tabs (Lovastatin) .... Once Daily  Allergies: 1)  ! Penicillin 2)  ! Codeine 3)  ! Zantac  Past History:  Past Medical History: CAD CABG  2005 EF 60-65%...echo.Marland Kitchen.10/2008 Aortic valve sclerosis, but no stenosis Hypertension Hyperlipidemia G I Bleed  (AVMs) Aneurysm-Thoracic Aortic (Ascending) Syncope (NTG & diuretic) Hyponatremia  COPD Smoking bradycardia Carotid artery disease...doppler 01/2009...0-39% RICA, 40-59% LICA Cholecystectomy Syncope.Marland KitchenMarland Kitchen4/2009.Marland Kitchen.?diuretics and Ntg and bradycardia ? Hyponatremia  Review of Systems       The patient denies fever, chills, headache, sweats, rash, change in vision, change in hearing.  She has a mild cough.  She is using home oxygen now.  She's not having chest pain.  She's not having any GI symptoms or urinary symptoms.  All other systems are reviewed and are negative.  Vital Signs:  Patient profile:   77 year old female Height:      72 inches Weight:      181 pounds BMI:     24.64 BP sitting:   145 / 67  (left arm) Cuff size:   large  Vitals Entered By: Oswald Hillock (April 29, 2009 9:41 AM)  Physical Exam  General:  patient is stable in general. Head:  head is atraumatic. Eyes:  no xanthelasma. Neck:  no jugular venous distention. Chest Wall:  no chest wall tenderness. Lungs:  lungs reveal decreased breath sounds with some chronic rhonchi.  There is no respiratory distress. Heart:  cardiac exam S1-S2.  No clicks or significant murmurs. Abdomen:  abdomen is soft. Msk:  no musculoskeletal deformities. Extremities:  trace ankle edema. Skin:  no skin rashes. Psych:  patient is oriented to  person time and place.  Affect is normal.   Impression & Recommendations:  Problem # 1:  SINUS BRADYCARDIA (ICD-427.81) Patient had an EKG today that is reviewed by me.  There is sinus bradycardia.  I have considered changing her beta blocker dose but she has no significant symptoms.  She had improvement in LV function in the past and I want to keep her beta blocker on board.  No change in therapy.  Problem # 2:  CAROTID ARTERY DISEASE (ICD-433.10) Patient had followup  carotid Dopplers on January 31, 2009.  Her anatomy is stable.  She will have follow up in one year.  Problem # 3:  TOBACCO ABUSE (ICD-305.1) Unfortunately the patient has started smoking a small amount.  I counseled her to stop.  She says that she will be stopping.  Problem # 4:  COPD (ICD-496) COPD a significant problem.  She is using home O2 now.  Problem # 5:  CAD (ICD-414.00) Coronary disease is stable.  EKG is done today and reviewed by me.  There is sinus bradycardia but no EKG change.  In the past the patient had some LV dysfunction and improved.  No further workup is needed at this time.  Problem # 6:  EDEMA (ICD-782.3) The patient does have trace edema.  This is probably related to a recent prednisone.  She is on a small dose of a diuretic.  In the past she had syncope when we push her diuretics further.  No change in therapy.  Patient Instructions: 1)  Follow up in 6 months

## 2010-05-27 NOTE — Assessment & Plan Note (Signed)
Summary: SOB ON EXERTION- SELF REFER //kp   Visit Type:  Initial Consult Copy to:  Self referred Primary Provider/Referring Provider:  Windle Guard, MD  CC:  Pt here for pulmonary consult . Pt c/o dysnea increasing in October/November 2010. Pt states Dr. Jeannetta Nap will not re-cert O2 for continuous use only for night time use.  History of Present Illness: 77/f, smoker presents for management of COPD. She has been on home O2 since dec 2010 ([facility]) after a chest cold requiring prednisone, CXR then showed hyperinflation. She smokes 1/2 PPD, about 47 Pyrs - chantix made her sick & she is afraid of cardiac side effects with patches. She sees Dr Myrtis Ser for CAD & has tolerated lsisnopril & metoprolol, echo 7/10 showed nml LV fn & non dilated RV. An episode of syncope in 2009 was attributed to NTG & diuretics. She reports clear phlegm, nasal drip & sneezing - no seasonal allergies, nasal atrovent dries her out. She has lt >> Rt pedal edema Desaturated to 86% onRA with minimal ambulation.   Preventive Screening-Counseling & Management  Alcohol-Tobacco     Smoking Status: current     Packs/Day: 0.5     Year Started: 1952  Medications Prior to Update: 1)  Nitroglycerin 0.4 Mg Subl (Nitroglycerin) .... Place 1 Tablet Under Tongue As Directed 2)  Icaps Mv  Tabs (Multiple Vitamins-Minerals) .... Take One Tablet By Mouth Twice Daily. 3)  Aspirin 81 Mg Tbec (Aspirin) .... Take One Tablet By Mouth Daily 4)  Calcium-Carb 600 + D 600-125 Mg-Unit Tabs (Calcium-Vitamin D) .... Take One Tablet By Mouth Once Daily. 5)  Proventil Hfa 108 (90 Base) Mcg/act Aers (Albuterol Sulfate) .... Uad Prn 6)  Spiriva Handihaler 18 Mcg Caps (Tiotropium Bromide Monohydrate) .... Uad Prn 7)  Vitamin D (Ergocalciferol) 50000 Unit Caps (Ergocalciferol) .... Q Weekly 8)  Verapamil Hcl Cr 180 Mg Xr24h-Cap (Verapamil Hcl) .... Take One Capsule By Mouth Twice A Day 9)  Lisinopril 40 Mg Tabs (Lisinopril) .... Take One Tablet By  Mouth Daily 10)  Metoprolol Tartrate 100 Mg Tabs (Metoprolol Tartrate) .... Take One Tablet By Mouth Twice A Day 11)  Potassium Chloride Crys Cr 20 Meq Cr-Tabs (Potassium Chloride Crys Cr) .... Take One Tablet By Mouth Twice A Day 12)  Miralax  Powd (Polyethylene Glycol 3350) .... Uad Prn 13)  Ibuprofen 200 Mg Tabs (Ibuprofen) .... As Needed 14)  Promethazine Hcl 25 Mg Tabs (Promethazine Hcl) .... As Needed 15)  Magnesium Oxide 420 Mg Tabs (Magnesium Oxide) .... Once Daily 16)  Prednisone 20 Mg Tabs (Prednisone) .... Finish The Last One Todeay 17)  Lovastatin 20 Mg Tabs (Lovastatin) .... Once Daily  Allergies: 1)  ! Penicillin 2)  ! Codeine 3)  ! Zantac  Past History:  Past Medical History: CAD CABG 2005 EF 60-65%...echo.Marland Kitchen.10/2008 Aortic valve sclerosis, but no stenosis Hypertension Hyperlipidemia G I Bleed  (AVMs) Aneurysm-Thoracic Aortic (Ascending) Syncope (NTG & diuretic) Hyponatremia  COPD Smoking bradycardia Carotid artery disease...doppler 01/2009...0-39% RICA, 40-59% LICA Cholecystectomy Syncope.Marland KitchenMarland Kitchen4/2009.Marland Kitchen.?diuretics and Ntg and bradycardia ? Hyponatremia **Exposed to Tuberculosis in the 1970's, was tested negative by [facility]  Past Surgical History: Carotid Endarterectomy Cholecystectomy Hysterectomy  Family History: CHF-mother Family History Lung Cancer-father (non-smoker), sister Family History Prostate Cancer-brother Family History Breast Cancer-sister  Social History: Marital Status: Widowed, lives alone Children: yes, 1 living Occupation: Retired from [employer] Patient is a current smoker. (1/2 ppd) Smoking Status:  current Packs/Day:  0.5  Review of Systems       The  patient complains of shortness of breath with activity, productive cough, indigestion, headaches, sneezing, itching, and hand/feet swelling.  The patient denies shortness of breath at rest, non-productive cough, coughing up blood, chest pain, irregular heartbeats, acid heartburn, loss  of appetite, weight change, abdominal pain, difficulty swallowing, sore throat, tooth/dental problems, nasal congestion/difficulty breathing through nose, ear ache, anxiety, depression, joint stiffness or pain, rash, change in color of mucus, and fever.    Vital Signs:  Patient profile:   77 year old female Height:      72 inches Weight:      185.50 pounds O2 Sat:      91 % on Room air Temp:     97.8 degrees F oral Pulse rate:   56 / minute BP sitting:   150 / 72  (left arm) Cuff size:   regular  Vitals Entered By: Zackery Barefoot CMA (August 02, 2009 3:55 PM)  O2 Flow:  Room air  Serial Vital Signs/Assessments:  Comments: Ambulatory Pulse Oximetry  Resting; HR_____    02 Sat_____  Lap1 (185 feet)   HR_____   02 Sat_____ Lap2 (185 feet)   HR_____   02 Sat_____    Lap3 (185 feet)   HR_____   02 Sat_____  ___Test Completed without Difficulty __X_Test Stopped due to: Pt desat to 86%RA after 3/4 of a lap. After patient recovered to 94% 2 liters pulse after 4 minutes of rest. Carver Fila SMA  August 02, 2009 5:32 PM   By: Zackery Barefoot CMA   CC: Pt here for pulmonary consult . Pt c/o dysnea increasing in October/November 2010. Pt states Dr. Jeannetta Nap will not re-cert O2 for continuous use only for night time use Comments Medications reviewed with patient Verified contact number and pharmacy with patient Zackery Barefoot CMA  August 02, 2009 3:56 PM    Physical Exam  Additional Exam:  Gen. Pleasant, thin woman, in no distress, normal affect ENT - no lesions, no post nasal drip Neck: No JVD, no thyromegaly, no carotid bruits Lungs: no use of accessory muscles, no dullness to percussion, decreased BL without rales or rhonchi  Cardiovascular: Rhythm regular, heart sounds  normal, no murmurs or gallops, 1+ peripheral edema Abdomen: soft and non-tender, no hepatosplenomegaly, BS normal. 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 Her updated medication list for this problem includes:    Nicotrol 10 Mg Inha (Nicotine) .Marland Kitchen..Marland Kitchen Two times a day as needed  Orders: Consultation Level IV (99244) Pulse Oximetry, Ambulatory (44034)  Medications Added to Medication List This Visit: 1)  Nicotrol 10 Mg Inha (Nicotine) .... Two times a day as needed 2)  O2 2 L/min - [facility]   Other Orders: DME Referral (DME)  Patient Instructions: 1)  Copy sent to: Dr Jeannetta Nap 2)  Please schedule a follow-up appointment in 2 weeks. 3)  Breathing test next visit  4)  Take full tab of (thiazide) water pill x 7 days 5)  Ambulatory satn 6)  Trial of symbicort 2 puffs two times a day  (SAMpLE) 7)  Trial of nicotrol inhaler two times a day as needed  Prescriptions: NICOTROL 10 MG INHA (NICOTINE) two times a day as needed  #14 x 1   Entered and Authorized by:   Comer Locket. Vassie Loll MD   Signed by:   Comer Locket Vassie Loll MD on 08/02/2009   Method used:   Electronically to        [facility]* (retail)       [address] [place], [place]  [address]       Ph: [phone] or [phone]       Fax: [phone]   RxID:   [id number]      Appended Document: SOB ON EXERTION- SELF REFER //kp reviewed PFTs 08/19/09 >> severe airway obstruction, no BD response, air trapping +, severe decrease in  diffusion

## 2010-05-27 NOTE — Miscellaneous (Signed)
  Clinical Lists Changes  Observations: Added new observation of PAST MED HX: CAD CABG 2005 EF 60-65%...echo.Marland Kitchen.10/2008 Aortic valve sclerosis, but no stenosis Hypertension Hyperlipidemia G I Bleed  (AVMs) Aneurysm-Thoracic Aortic (Ascending) Syncope (NTG & diuretic) Hyponatremia  COPD Smoking Normal  function Carotid artery disease...doppler 01/2009...0-39% RICA, 40-59% LICA Cholecystectomy Syncope.Marland KitchenMarland Kitchen4/2009.Marland Kitchen.?diuretics and Ntg and bradycardia ? Hyponatremia (04/28/2009 20:48) Added new observation of PRIMARY MD: Windle Guard, MD (04/28/2009 20:48)       Past History:  Past Medical History: CAD CABG 2005 EF 60-65%...echo.Marland Kitchen.10/2008 Aortic valve sclerosis, but no stenosis Hypertension Hyperlipidemia G I Bleed  (AVMs) Aneurysm-Thoracic Aortic (Ascending) Syncope (NTG & diuretic) Hyponatremia  COPD Smoking Normal  function Carotid artery disease...doppler 01/2009...0-39% RICA, 40-59% LICA Cholecystectomy Syncope.Marland KitchenMarland Kitchen4/2009.Marland Kitchen.?diuretics and Ntg and bradycardia ? Hyponatremia

## 2010-05-27 NOTE — Progress Notes (Signed)
Summary: rx sub/ [facility] calling  Phone Note From Pharmacy   Caller: Davonna Belling w/ [facility] Call For: [name]  Summary of Call: re: proventil rx. pt "usually gets generic- albuterol or proair as this is less expensive. can the proventil be subbed or does it need to be proventil? [phone]. ref# [id number] Initial call taken by: Tivis Ringer, CNA,  January 10, 2010 12:33 PM  Follow-up for Phone Call        spoke with pharmacists at [facility] and advised it was ok to prescribe proair instead of proventil, rx changedsame quanity and directions as proventil.  Carron Curie CMA  January 10, 2010 1:03 PM

## 2010-05-27 NOTE — Assessment & Plan Note (Signed)
Summary: ROV ///KP   Visit Type:  Follow-up Copy to:  Self referred Primary Provider/Referring Provider:  Windle Guard, MD  CC:  Rm A-8. Pt here for follow up. Pt states is still smoking up to a 3/4 ppd. Pt c/o SOB with exertion unchanged.  History of Present Illness: 80/f, smoker presents for management of COPD. She has been on home O2 since dec 2010 ([facility]) after a chest cold requiring prednisone, CXR then showed hyperinflation. She smokes 1/2 PPD, about 80 Pyrs - chantix made her sick & she is afraid of cardiac side effects with patches. She sees Dr Myrtis Ser for CAD & has tolerated lsisnopril & metoprolol, echo 7/10 showed nml LV fn & non dilated RV. An episode of syncope in 2009 was attributed to NTG & diuretics. She reports clear phlegm, nasal drip & sneezing - no seasonal allergies, nasal atrovent dries her out. She has lt >> Rt pedal edema Desaturated to 86% onRA with minimal ambulation. reviewed PFTs 08/19/09 >> severe airway obstruction, FEV1 47%, no BD response, air trapping +, severe decrease in diffusion  Sep 03, 2009 2:00 PM  smokes 1/2 PPD , symbicort may have helped some. -compliant with O2   Current Medications (verified): 1)  Nitroglycerin 0.4 Mg Subl (Nitroglycerin) .... Place 1 Tablet Under Tongue As Directed 2)  Ocuvite  Tabs (Multiple Vitamins-Minerals) .... Take 1 Tablet By Mouth Once A Day 3)  Aspirin 81 Mg Tbec (Aspirin) .... Take One Tablet By Mouth Daily 4)  Calcium-Carb 600 + D 600-125 Mg-Unit Tabs (Calcium-Vitamin D) .... Take One Tablet By Mouth Once Daily. 5)  Proventil Hfa 108 (90 Base) Mcg/act Aers (Albuterol Sulfate) .... Uad Prn 6)  Spiriva Handihaler 18 Mcg Caps (Tiotropium Bromide Monohydrate) .... Uad Prn 7)  Vitamin D (Ergocalciferol) 50000 Unit Caps (Ergocalciferol) .... Q Weekly 8)  Verapamil Hcl Cr 180 Mg Xr24h-Cap (Verapamil Hcl) .... Take One Capsule By Mouth Twice A Day 9)  Lisinopril 40 Mg Tabs (Lisinopril) .... Take One Tablet By Mouth  Daily 10)  Metoprolol Tartrate 100 Mg Tabs (Metoprolol Tartrate) .... Take 2 Tablet By Mouth Two Times A Day 11)  Potassium Chloride Crys Cr 20 Meq Cr-Tabs (Potassium Chloride Crys Cr) .... Take One Tablet By Mouth Twice A Day 12)  Miralax  Powd (Polyethylene Glycol 3350) .... Uad Prn 13)  Ibuprofen 200 Mg Tabs (Ibuprofen) .... As Needed 14)  Magnesium Oxide 420 Mg Tabs (Magnesium Oxide) .... Once Daily 15)  Lovastatin 40 Mg Tabs (Lovastatin) .... Take 1 Tablet By Mouth Once A Day 16)  O2 2 L/min - [facility] 17)  Hydrochlorothiazide 50 Mg Tabs (Hydrochlorothiazide) .... Take 1/2 Tablet By Mouth Once A Day 18)  Multivitamins   Tabs (Multiple Vitamin) .... Take 1 Tablet By Mouth Once A Day 19)  Ferrous Sulfate 325 (65 Fe) Mg Tabs (Ferrous Sulfate) .... Take 1 Tablet By Mouth Once A Day 20)  Ipratropium Bromide Nasal Solution .... 2 Sprays As Needed 21)  Symbicort 160-4.5 Mcg/act Aero (Budesonide-Formoterol Fumarate) .... Inhale 2 Puffs Two Times A Day 22)  Nicotrol 10 Mg Inha (Nicotine) .... Use As Directed  Allergies (verified): 1)  ! Penicillin 2)  ! Codeine 3)  ! Zantac  Past History:  Past Medical History: Last updated: 08/02/2009 CAD CABG 2005 EF 60-65%...echo.Marland Kitchen.10/2008 Aortic valve sclerosis, but no stenosis Hypertension Hyperlipidemia G I Bleed  (AVMs) Aneurysm-Thoracic Aortic (Ascending) Syncope (NTG & diuretic) Hyponatremia  COPD Smoking bradycardia Carotid artery disease...doppler 01/2009...0-39% RICA, 40-59% LICA Cholecystectomy Syncope.Marland KitchenMarland Kitchen4/2009.Marland Kitchen.?diuretics and Ntg  and bradycardia ? Hyponatremia **Exposed to Tuberculosis in the 1970's, was tested negative by [facility]  Social History: Last updated: 08/02/2009 Marital Status: Widowed, lives alone Children: yes, 1 living Occupation: Retired from [employer] Patient is a current smoker. (1/2 ppd)  Review of Systems       The patient complains of dyspnea on exertion and peripheral edema.  The patient denies anorexia,  fever, weight loss, weight gain, vision loss, decreased hearing, hoarseness, chest pain, syncope, prolonged cough, headaches, hemoptysis, abdominal pain, melena, hematochezia, severe indigestion/heartburn, hematuria, muscle weakness, suspicious skin lesions, transient blindness, difficulty walking, depression, unusual weight change, and abnormal bleeding.    Vital Signs:  Patient profile:   77 year old female Height:      72 inches Weight:      184 pounds O2 Sat:      93 % on 2 L/min Temp:     98.3 degrees F oral Pulse rate:   50 / minute BP sitting:   118 / 72  (left arm) Cuff size:   regular  Vitals Entered By: Zackery Barefoot CMA (Sep 03, 2009 1:47 PM)  O2 Flow:  2 L/min CC: Rm A-8. Pt here for follow up. Pt states is still smoking up to a 3/4 ppd. Pt c/o SOB with exertion unchanged Comments Medications reviewed with patient Verified contact number and pharmacy with patient Zackery Barefoot CMA  Sep 03, 2009 1:48 PM    Physical Exam  Additional Exam:  Gen. 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 Patient Level III (16109)  Problem # 3:  TOBACCO ABUSE (ICD-305.1)  -will try nicotine patches Her updated medication list for this problem includes:    Nicotrol 10 Mg Inha (Nicotine) ..... Use as directed  Orders: Est. Patient Level III (60454)  Medications Added to Medication List This Visit: 1)  Lovastatin 40 Mg Tabs  (Lovastatin) .... Take 1 tablet by mouth once a day 2)  Symbicort 160-4.5 Mcg/act Aero (Budesonide-formoterol fumarate) .... Inhale 2 puffs two times a day 3)  Symbicort 160-4.5 Mcg/act Aero (Budesonide-formoterol fumarate) .... Inhale 2 puffs two times a day 4)  Nicotrol 10 Mg Inha (Nicotine) .... Use as directed  Patient Instructions: 1)  Please schedule a follow-up appointment in 4 months with TP 2)  Keep trying to cut down with patches & nicotine inhaler Prescriptions: SYMBICORT 160-4.5 MCG/ACT AERO (BUDESONIDE-FORMOTEROL FUMARATE) Inhale 2 puffs two times a day  #3 x 2   Entered and Authorized by:   Comer Locket Vassie Loll MD   Signed by:   Comer Locket Vassie Loll MD on 09/03/2009   Method used:   Faxed to ...       [facility] (mail-order)             , [place]         Ph:        Fax: [phone]   RxID:   [id number]

## 2010-05-29 NOTE — Progress Notes (Signed)
Summary: re meds  Phone Note Call from Patient Call back at Home Phone [phone]   Caller: Patient Reason for Call: Talk to Nurse Summary of Call: pt calling back re meds. Initial call taken by: Roe Coombs,  May 01, 2010 2:08 PM  Follow-up for Phone Call        spoke w/pt she confirmed her dose of hctz is 50mg  and she takes 1/2 tab but when she has swelling she takes a full tab will send in new rx Meredith Staggers, RN  May 02, 2010 9:00 AM     New/Updated Medications: HYDROCHLOROTHIAZIDE 50 MG TABS (HYDROCHLOROTHIAZIDE) Take 1/2 tablet by mouth once a day, may take 1 tab daily if needed for edema Prescriptions: HYDROCHLOROTHIAZIDE 50 MG TABS (HYDROCHLOROTHIAZIDE) Take 1/2 tablet by mouth once a day, may take 1 tab daily if needed for edema  #90 x 3   Entered by:   Meredith Staggers, RN   Authorized by:   Talitha Givens, MD, [facility]   Signed by:   Meredith Staggers, RN on 05/02/2010   Method used:   Faxed to ...       [facility] (mail-order)             , [place]         Ph:        Fax: [phone]   RxID:   [id number]

## 2010-05-29 NOTE — Assessment & Plan Note (Signed)
Summary: 4 months/apc   Visit Type:  Follow-up Copy to:  Self referred Primary Provider/Referring Provider:  Windle Guard, MD  CC:  COPD follow up. Pt c/o "cough and sneezing spells". Productive cough with clear to white mucus and SOB with any exertion.  History of Present Illness: 80/F, smoker presents for management of COPD. She has been on home O2 since dec 2010 ([facility]) after a chest cold requiring prednisone, CXR then showed hyperinflation. She smokes 1/2 PPD, about 11 Pyrs - chantix made her sick & she is afraid of cardiac side effects with patches. She sees Dr Myrtis Ser for CAD & has tolerated lsisnopril & metoprolol, echo 7/10 showed nml LV fn & non dilated RV. An episode of syncope in 2009 was attributed to NTG & diuretics. She reports clear phlegm, nasal drip & sneezing - no seasonal allergies, nasal atrovent dries her out. She has lt >> Rt pedal edema Desaturated to 86% onRA with minimal ambulation. reviewed PFTs 08/19/09 >> severe airway obstruction, FEV1 47%, no BD response, air trapping +, severe decrease in diffusion  Sep 03, 2009 2:00 PM  smokes 1/2 PPD , symbicort may have helped some. -compliant with O2  May 02, 2010 2:17 PM - 4 mnth FU stable, no new symptoms, has not needed rescue inhaler much, not interested in exercise program int he winter, compliant with O2 , helpful contnues to smoke Denies chest pain, , orthopnea, hemoptysis, fever, n/v/d, edema, headache.   Preventive Screening-Counseling & Management  Alcohol-Tobacco     Smoking Status: current     Packs/Day: 0.75     Year Started: 1952  Current Medications (verified): 1)  Nitroglycerin 0.4 Mg Subl (Nitroglycerin) .... Place 1 Tablet Under Tongue As Directed 2)  Ocuvite  Tabs (Multiple Vitamins-Minerals) .... Take 1 Tablet By Mouth Once A Day 3)  Aspirin 81 Mg Tbec (Aspirin) .... Take One Tablet By Mouth Daily 4)  Calcium-Carb 600 + D 600-125 Mg-Unit Tabs (Calcium-Vitamin D) .... Take One Tablet By  Mouth Once Daily. 5)  Proair Hfa 108 (90 Base) Mcg/act Aers (Albuterol Sulfate) .... As Needed 6)  Spiriva Handihaler 18 Mcg Caps (Tiotropium Bromide Monohydrate) .... Once Daily 7)  Vitamin D (Ergocalciferol) 50000 Unit Caps (Ergocalciferol) .Marland Kitchen.. 1 Tab By Mouth Every Other Week 8)  Verapamil Hcl Cr 180 Mg Xr24h-Cap (Verapamil Hcl) .... Take One Capsule By Mouth Twice A Day 9)  Lisinopril 40 Mg Tabs (Lisinopril) .... Take One Tablet By Mouth Daily 10)  Metoprolol Tartrate 100 Mg Tabs (Metoprolol Tartrate) .... Take 2 Tablet By Mouth Two Times A Day 11)  Potassium Chloride Crys Cr 20 Meq Cr-Tabs (Potassium Chloride Crys Cr) .... Take One Tablet By Mouth Twice A Day 12)  Miralax  Powd (Polyethylene Glycol 3350) .... Uad Prn 13)  Ibuprofen 200 Mg Tabs (Ibuprofen) .... As Needed 14)  Lovastatin 40 Mg Tabs (Lovastatin) .... Take 1/2 Tablet By Mouth Once A Day 15)  O2 2 L/min - [facility] 16)  Hydrochlorothiazide 50 Mg Tabs (Hydrochlorothiazide) .... Take 1/2 To 1 Tablet By Mouth Once A Day, May Take 1 Tab Daily If Needed For Edema 17)  Multivitamins   Tabs (Multiple Vitamin) .... Take 1 Tablet By Mouth Once A Day 18)  Ferrous Sulfate 325 (65 Fe) Mg Tabs (Ferrous Sulfate) .... Take 1 Tablet By Mouth Once A Day 19)  Ipratropium Bromide Nasal Solution .... 2 Sprays As Needed 20)  Nicotrol 10 Mg Inha (Nicotine) .... Use As Directed 21)  Symbicort 160-4.5 Mcg/act Aero (  Budesonide-Formoterol Fumarate) .... Take 2 Puffs Two Times A Day and Rinse Mouth After Each Use  Allergies (verified): 1)  ! Penicillin 2)  ! Codeine 3)  ! Zantac 4)  Sulfa  Past History:  Past Medical History: Last updated: 05/01/2010 CAD CABG 2005 EF 60-65%...echo.Marland Kitchen.10/2008 Aortic valve sclerosis, but no stenosis Hypertension Hyperlipidemia G I Bleed  (AVMs) Aneurysm-Thoracic Aortic (Ascending) (There was question of this in the past.  Echo.. July, 2010... reveals no evidence of significant dilatation of the aorta.... no  further workup needed) Syncope (NTG & diuretic) Hyponatremia  COPD Tobacco abuse bradycardia Carotid artery disease...doppler 01/2009...0-39% RICA, 40-59% LICA  /   doppler..01/2010...stable 0-39% RICA, 40-59% LICA Cholecystectomy Syncope.Marland KitchenMarland Kitchen4/2009.Marland Kitchen.?diuretics and Ntg and bradycardia ? Hyponatremia **Exposed to Tuberculosis in the 1970's, was tested negative by [facility] Dept  Social History: Last updated: 08/02/2009 Marital Status: Widowed, lives alone Children: yes, 1 living Occupation: Retired from [employer] Patient is a current smoker. (1/2 ppd)  Social History: Packs/Day:  0.75  Review of Systems       The patient complains of dyspnea on exertion.  The patient denies anorexia, fever, weight loss, weight gain, vision loss, decreased hearing, hoarseness, chest pain, syncope, peripheral edema, prolonged cough, headaches, hemoptysis, abdominal pain, melena, hematochezia, severe indigestion/heartburn, hematuria, muscle weakness, suspicious skin lesions, transient blindness, difficulty walking, depression, unusual weight change, abnormal bleeding, enlarged lymph nodes, and angioedema.    Vital Signs:  Patient profile:   77 year old female Height:      72 inches Weight:      185.4 pounds BMI:     25.24 O2 Sat:      94 % on 2 L/min pulsed Temp:     97.4 degrees F oral Pulse rate:   51 / minute BP sitting:   120 / 78  (left arm) Cuff size:   regular  Vitals Entered By: Zackery Barefoot CMA (May 02, 2010 2:03 PM)  O2 Flow:  2 L/min pulsed  O2 Sat Comments Upon arrival pt O2 89%2 liters pulsed P-74. After rest pt recovered to 94% 2 liters pulsed. Zackery Barefoot CMA  May 02, 2010 2:16 PM  CC: COPD follow up. Pt c/o "cough and sneezing spells". Productive cough with clear to white mucus, SOB with any exertion Comments Medications reviewed with patient Verified contact number and pharmacy with patient Zackery Barefoot CMA  May 02, 2010 2:07 PM    Physical  Exam  Additional Exam:  Gen. Pleasant, thin woman, in no distress, normal affect ENT - no lesions, no post nasal drip Neck: No JVD, no thyromegaly, no carotid bruits Lungs: kyphotic, no use of accessory muscles, no dullness to percussion, decreased BL without rales or rhonchi  Cardiovascular: Rhythm regular, heart sounds  normal, no murmurs or gallops, 1+ peripheral edema Musculoskeletal: No deformities, no cyanosis or clubbing      Impression & Recommendations:  Problem # 1:  COPD (ICD-496) Assessment Unchanged ct spiriva & symbicort  Problem # 2:  TOBACCO ABUSE (ICD-305.1)  discussed dangers of O2 & smoking Her updated medication list for this problem includes:    Nicotrol 10 Mg Inha (Nicotine) ..... Use as directed  Orders: Est. Patient Level III (65784)  Medications Added to Medication List This Visit: 1)  Spiriva Handihaler 18 Mcg Caps (Tiotropium bromide monohydrate) .... Once daily 2)  Vitamin D (ergocalciferol) 50000 Unit Caps (Ergocalciferol) .Marland Kitchen.. 1 tab by mouth every other week 3)  Hydrochlorothiazide 50 Mg Tabs (Hydrochlorothiazide) .... Take 1/2 to 1 tablet  by mouth once a day, may take 1 tab daily if needed for edema  Patient Instructions: 1)  Please schedule a follow-up appointment in 4 months with TP 2)  Try to rememer to take symbicort at bedtime too

## 2010-05-29 NOTE — Assessment & Plan Note (Signed)
Summary: per check out/sf   Visit Type:  Follow-up Primary Provider:  Windle Guard, MD  CC:  CAD.  History of Present Illness: Patient is seen for followup of coronary artery disease.  She has severe lung disease.  She uses continuous O2.  Unfortunately she does continue to smoke.  I had a long discussion with her about this today.  She is proud of herself and that she is cutting back and she is determined to stop.  She's not having any chest pain.  Current Medications (verified): 1)  Nitroglycerin 0.4 Mg Subl (Nitroglycerin) .... Place 1 Tablet Under Tongue As Directed 2)  Ocuvite  Tabs (Multiple Vitamins-Minerals) .... Take 1 Tablet By Mouth Once A Day 3)  Aspirin 81 Mg Tbec (Aspirin) .... Take One Tablet By Mouth Daily 4)  Calcium-Carb 600 + D 600-125 Mg-Unit Tabs (Calcium-Vitamin D) .... Take One Tablet By Mouth Once Daily. 5)  Proair Hfa 108 (90 Base) Mcg/act Aers (Albuterol Sulfate) .... As Needed 6)  Spiriva Handihaler 18 Mcg Caps (Tiotropium Bromide Monohydrate) .... Uad Prn 7)  Vitamin D (Ergocalciferol) 50000 Unit Caps (Ergocalciferol) .... Q Weekly 8)  Verapamil Hcl Cr 180 Mg Xr24h-Cap (Verapamil Hcl) .... Take One Capsule By Mouth Twice A Day 9)  Lisinopril 40 Mg Tabs (Lisinopril) .... Take One Tablet By Mouth Daily 10)  Metoprolol Tartrate 100 Mg Tabs (Metoprolol Tartrate) .... Take 2 Tablet By Mouth Two Times A Day 11)  Potassium Chloride Crys Cr 20 Meq Cr-Tabs (Potassium Chloride Crys Cr) .... Take One Tablet By Mouth Twice A Day 12)  Miralax  Powd (Polyethylene Glycol 3350) .... Uad Prn 13)  Ibuprofen 200 Mg Tabs (Ibuprofen) .... As Needed 14)  Lovastatin 40 Mg Tabs (Lovastatin) .... Take 1/2 Tablet By Mouth Once A Day 15)  O2 2 L/min - [facility] 16)  Hydrochlorothiazide 50 Mg Tabs (Hydrochlorothiazide) .... Take 1/2 Tablet By Mouth Once A Day 17)  Multivitamins   Tabs (Multiple Vitamin) .... Take 1 Tablet By Mouth Once A Day 18)  Ferrous Sulfate 325 (65 Fe) Mg Tabs  (Ferrous Sulfate) .... Take 1 Tablet By Mouth Once A Day 19)  Ipratropium Bromide Nasal Solution .... 2 Sprays As Needed 20)  Nicotrol 10 Mg Inha (Nicotine) .... Use As Directed 21)  Symbicort 160-4.5 Mcg/act Aero (Budesonide-Formoterol Fumarate) .... Take 2 Puffs Two Times A Day and Rinse Mouth After Each Use 22)  Ibuprofen 200 Mg Tabs (Ibuprofen) .... As Needed 23)  Miralax  Powd (Polyethylene Glycol 3350) .... As Needed  Allergies (verified): 1)  ! Penicillin 2)  ! Codeine 3)  ! Zantac  Past History:  Past Medical History: CAD CABG 2005 EF 60-65%...echo.Marland Kitchen.10/2008 Aortic valve sclerosis, but no stenosis Hypertension Hyperlipidemia G I Bleed  (AVMs) Aneurysm-Thoracic Aortic (Ascending) (There was question of this in the past.  Echo.. July, 2010... reveals no evidence of significant dilatation of the aorta.... no further workup needed) Syncope (NTG & diuretic) Hyponatremia  COPD Tobacco abuse bradycardia Carotid artery disease...doppler 01/2009...0-39% RICA, 40-59% LICA  /   doppler..01/2010...stable 0-39% RICA, 40-59% LICA Cholecystectomy Syncope.Marland KitchenMarland Kitchen4/2009.Marland Kitchen.?diuretics and Ntg and bradycardia ? Hyponatremia **Exposed to Tuberculosis in the 1970's, was tested negative by [facility]  Review of Systems       Patient denies fever, chills, headache, sweats, rash, change in vision, change in hearing, chest pain, nausea vomiting, urinary symptoms.  She mentions that she has some leg weakness.  She knows that she is not active enough.  All other systems are reviewed and  are negative.  Vital Signs:  Patient profile:   77 year old female Height:      72 inches Weight:      182 pounds BMI:     24.77 Pulse rate:   50 / minute BP sitting:   128 / 62  (left arm) Cuff size:   regular  Vitals Entered By: Hardin Negus, RMA (May 01, 2010 10:35 AM)  Physical Exam  General:  the patient is stable today wearing her oxygen. Eyes:  no xanthelasma. Neck:  no jugular venous  distention. Lungs:  lungs reveal decreased breath sounds. Heart:  cardiac exam reveals S1-S2.  There no clicks or significant murmurs Abdomen:  abdomen is soft. Extremities:  trace peripheral edema in her left ankle. Psych:  patient is oriented to person time and place.  Affect is normal.   Impression & Recommendations:  Problem # 1:  EDEMA (ICD-782.3) The edema is very minimal.  She is on a diuretic.  Her chemistry was checked several months ago.  She does not need labs today.  Problem # 2:  CAROTID ARTERY DISEASE (ICD-433.10)  Her updated medication list for this problem includes:    Aspirin 81 Mg Tbec (Aspirin) .Marland Kitchen... Take one tablet by mouth daily Her carotids are followed very carefully with Dopplers.  Problem # 3:  TOBACCO ABUSE (ICD-305.1) The patient does say that she is now committed to stop smoking completely.  She says she is down to 10 cigarettes per day.  Problem # 4:  HYPERTENSION (ICD-401.9)  Her updated medication list for this problem includes:    Aspirin 81 Mg Tbec (Aspirin) .Marland Kitchen... Take one tablet by mouth daily    Verapamil Hcl Cr 180 Mg Xr24h-cap (Verapamil hcl) .Marland Kitchen... Take one capsule by mouth twice a day    Lisinopril 40 Mg Tabs (Lisinopril) .Marland Kitchen... Take one tablet by mouth daily    Metoprolol Tartrate 100 Mg Tabs (Metoprolol tartrate) .Marland Kitchen... Take 2 tablet by mouth two times a day    Hydrochlorothiazide 50 Mg Tabs (Hydrochlorothiazide) .Marland Kitchen... Take 1/2 tablet by mouth once a day Blood pressure stable today.  No change in therapy.  Problem # 5:  CAD (ICD-414.00)  Her updated medication list for this problem includes:    Nitroglycerin 0.4 Mg Subl (Nitroglycerin) .Marland Kitchen... Place 1 tablet under tongue as directed    Aspirin 81 Mg Tbec (Aspirin) .Marland Kitchen... Take one tablet by mouth daily    Verapamil Hcl Cr 180 Mg Xr24h-cap (Verapamil hcl) .Marland Kitchen... Take one capsule by mouth twice a day    Lisinopril 40 Mg Tabs (Lisinopril) .Marland Kitchen... Take one tablet by mouth daily    Metoprolol Tartrate  100 Mg Tabs (Metoprolol tartrate) .Marland Kitchen... Take 2 tablet by mouth two times a day Coronary disease is stable clinically.  No change in therapy.  We'll see her back in 6 months.  Patient Instructions: 1)  Your physician wants you to follow-up in:  6 months.  You will receive a reminder letter in the mail two months in advance. If you don't receive a letter, please call our office to schedule the follow-up appointment.

## 2010-05-29 NOTE — Miscellaneous (Signed)
  Clinical Lists Changes  Observations: Added new observation of PAST MED HX: CAD CABG 2005 EF 60-65%...echo.Marland Kitchen.10/2008 Aortic valve sclerosis, but no stenosis Hypertension Hyperlipidemia G I Bleed  (AVMs) Aneurysm-Thoracic Aortic (Ascending) (There was question of this in the past.  Echo.. July, 2010... reveals no evidence of significant dilatation of the aorta.... no further workup needed) Syncope (NTG & diuretic) Hyponatremia  COPD Smoking bradycardia Carotid artery disease...doppler 01/2009...0-39% RICA, 40-59% LICA  /   doppler..01/2010...stable 0-39% RICA, 40-59% LICA Cholecystectomy Syncope.Marland KitchenMarland Kitchen4/2009.Marland Kitchen.?diuretics and Ntg and bradycardia ? Hyponatremia **Exposed to Tuberculosis in the 1970's, was tested negative by [facility] (04/30/2010 15:58) Added new observation of REFERRING MD: Self referred (04/30/2010 15:58) Added new observation of PRIMARY MD: Windle Guard, MD (04/30/2010 15:58)       Past History:  Past Medical History: CAD CABG 2005 EF 60-65%...echo.Marland Kitchen.10/2008 Aortic valve sclerosis, but no stenosis Hypertension Hyperlipidemia G I Bleed  (AVMs) Aneurysm-Thoracic Aortic (Ascending) (There was question of this in the past.  Echo.. July, 2010... reveals no evidence of significant dilatation of the aorta.... no further workup needed) Syncope (NTG & diuretic) Hyponatremia  COPD Smoking bradycardia Carotid artery disease...doppler 01/2009...0-39% RICA, 40-59% LICA  /   doppler..01/2010...stable 0-39% RICA, 40-59% LICA Cholecystectomy Syncope.Marland KitchenMarland Kitchen4/2009.Marland Kitchen.?diuretics and Ntg and bradycardia ? Hyponatremia **Exposed to Tuberculosis in the 1970's, was tested negative by [facility]

## 2010-06-11 ENCOUNTER — Encounter: Payer: Self-pay | Admitting: Pulmonary Disease

## 2010-06-18 NOTE — Letter (Signed)
Summary: CMN for Oxygen/[facility]  CMN for Oxygen/[facility]   Imported By: Sherian Rein 06/13/2010 15:05:19  _____________________________________________________________________  External Attachment:    Type:   Image     Comment:   External Document

## 2010-09-01 ENCOUNTER — Ambulatory Visit (INDEPENDENT_AMBULATORY_CARE_PROVIDER_SITE_OTHER)
Admission: RE | Admit: 2010-09-01 | Discharge: 2010-09-01 | Disposition: A | Discharge status: Home / Self Care | Payer: Medicare Other | Source: Ambulatory Visit | Attending: Adult Health | Admitting: Adult Health

## 2010-09-01 ENCOUNTER — Encounter: Payer: Self-pay | Admitting: Adult Health

## 2010-09-01 ENCOUNTER — Ambulatory Visit (INDEPENDENT_AMBULATORY_CARE_PROVIDER_SITE_OTHER): Payer: Medicare Other | Admitting: Adult Health

## 2010-09-01 VITALS — BP 136/74 | HR 63 | Temp 97.9°F | Ht 68.0 in | Wt 177.4 lb

## 2010-09-01 DIAGNOSIS — J449 Chronic obstructive pulmonary disease, unspecified: Secondary | ICD-10-CM

## 2010-09-01 DIAGNOSIS — J4489 Other specified chronic obstructive pulmonary disease: Secondary | ICD-10-CM

## 2010-09-01 NOTE — Progress Notes (Signed)
Subjective:    Patient ID: [name], female    DOB: 1929/11/15, 77 y.o.   MRN: [id number]  HPI 52 , smoker presents for management of COPD.   She has been on home O2 since dec 2010 ([facility]) after a chest cold requiring prednisone, CXR then showed hyperinflation. She smokes 1/2 PPD, about 64 Pyrs - chantix made her sick & she is afraid of cardiac side effects with patches. She sees Dr Myrtis Ser for CAD & has tolerated lsisnopril & metoprolol, echo 7/10 showed nml LV fn & non dilated RV. An episode of syncope in 2009 was attributed to NTG & diuretics.  She reports clear phlegm, nasal drip & sneezing - no seasonal allergies, nasal atrovent dries her out.  She has lt >> Rt pedal edema  Desaturated to 86% onRA with minimal ambulation.  reviewed PFTs 08/19/09 >> severe airway obstruction, FEV1 47%, no BD response, air trapping +, severe decrease in diffusion   Sep 03, 2009 2:00 PM  smokes 1/2 PPD , symbicort may have helped some.  -compliant with O2   May 02, 2010 2:17 PM - 4 mnth FU  stable, no new symptoms, has not needed rescue inhaler much, not interested in exercise program int he winter, compliant with O2 , helpful  contnues to smoke    09/01/10 Follow up  4 month follow up COPD > prod cough with clear mucus, wheezing and DOE that pt reports is unchanged from last ov.  She is unfortunately still smoking. No discolored mucus or fever. Advised of no smoking in home -O2 dangers.    Of note on ACE inhibitor. Denies any incresased cough .   Last CXR 2009 w/ no acute process.   Review of Systems Constitutional:   No  weight loss, night sweats,  Fevers, chills   HEENT:   No headaches,  Difficulty swallowing,  Tooth/dental problems, or  Sore throat,                No sneezing, itching, ear ache, nasal congestion, post nasal drip,   CV:  No chest pain,  Orthopnea, PND, swelling in lower extremities, anasarca, dizziness, palpitations, syncope.   GI  No heartburn, indigestion, abdominal  pain, nausea, vomiting, diarrhea, change in bowel habits, loss of appetite, bloody stools.   Resp:  No coughing up of blood.  No change in color of mucus.  No wheezing.  No chest wall deformity  Skin: no rash or lesions.  GU: no dysuria, change in color of urine, no urgency or frequency.  No flank pain, no hematuria   MS:  No joint pain or swelling.  No decreased range of motion.    Psych:  No change in mood or affect. No depression or anxiety.  No memory loss.         Objective:   Physical Exam GEN: A/Ox3; pleasant , NAD, elderly obese   HEENT:  Holiday/AT,  EACs-clear, TMs-wnl, NOSE-clear, THROAT-clear, no lesions, no postnasal drip or exudate noted.   NECK:  Supple w/ fair ROM; no JVD; normal carotid impulses w/o bruits; no thyromegaly or nodules palpated; no lymphadenopathy.  RESP  Coarse BS w/ no  wheezes/ rales/ or rhonchi.no accessory muscle use, no dullness to percussion  CARD:  RRR, no m/r/g  , tr-1+ peripheral edema, pulses intact, no cyanosis or clubbing.  GI:   Soft & nt; nml bowel sounds; no organomegaly or masses detected.  Musco: Warm bil, no deformities or joint swelling noted.   Neuro: alert,  no focal deficits noted.    Skin: Warm, no lesions or rashes        Assessment & Plan:

## 2010-09-01 NOTE — Patient Instructions (Signed)
Continue on current regimen.  MOST IMPORTANT IS TO STOP SMOKING.  ABSOLUTELY NO SMOKING IN HOUSE WITH OXYGEN.  follow up Dr. Vassie Loll  In 3 months

## 2010-09-01 NOTE — Assessment & Plan Note (Addendum)
Compensated on present regimen Advised on smoking cesstation  cxr for today.  No changes  follow up Dr. Vassie Loll in 3 months and As needed   Keep watch on ace- no increased cough will cont to monitor

## 2010-09-04 ENCOUNTER — Other Ambulatory Visit: Payer: Self-pay | Admitting: Adult Health

## 2010-09-04 DIAGNOSIS — J449 Chronic obstructive pulmonary disease, unspecified: Secondary | ICD-10-CM

## 2010-09-04 NOTE — Progress Notes (Signed)
CT chest w/ contrast ordered per TP's request / recs to 5.7.12 cxr.  See result note for details.

## 2010-09-09 NOTE — Discharge Summary (Signed)
NAME:  NENE, ARANAS               ACCOUNT NO.:  [id number]   MEDICAL RECORD NO.:  [id number]          PATIENT TYPE:  INP   LOCATION:  3739                         FACILITY:  [facility]   PHYSICIAN:  Jonelle Sidle, MD DATE OF BIRTH:  03/02/1930   DATE OF ADMISSION:  08/20/2007  DATE OF DISCHARGE:  08/23/2007                               DISCHARGE SUMMARY   PROCEDURES:  CT of the chest with contrast.   PRIMARY FINAL DISCHARGE DIAGNOSIS:  Syncope after using nitroglycerin.   SECONDARY DIAGNOSES:  1. Hyponatremia, possibly related to diuretic use.  2. Osteoarthritis.  3. Hyperlipidemia with a total cholesterol of 153, triglycerides 148,      HDL 42, and LDL 81.  4. Ongoing tobacco use.  5. Arthritis.  6. Status post aortocoronary bypass surgery in 2005 with left internal      mammary artery to left anterior descending, saphenous vein graft to      diagonal, saphenous vein graft to posterior descending artery, and      saphenous vein graft to obtuse marginal 1 and distal circumflex.  7. Intestinal angiodysplasia.  8. History of anemia.  9. 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  10.Chronic obstructive pulmonary disease.  11.History of myocardial infarction in 2004.   TIME OF DISCHARGE:  49 minutes.   HOSPITAL COURSE:  Ms. Hendriks is a 77 year old female with a history of  coronary artery disease.  She had substernal chest pain on the day of  admission, took a sublingual nitroglycerin, and had a syncopal episode.  She came to the emergency room and was admitted.   Her sodium level was low on admission and 126.  It dropped to 121 and  she was placed on IV normal saline.  On a recheck, her sodium was 125  with a potassium of 3.3.  There was concern for an UTI, so she had a  urine culture which is pending at the time of dictation.  She was not  febrile, and her white count was within normal limits, so she is not  currently on  antibiotics.  She was placed on fluid restrictions which  did not change her sodium level.  A random urine sodium was 66, and  urine osmolality was borderline low at 398.  The patient denies drinking  excess fluid.   Her cardiac enzymes were negative serially for MI.  She had nodular  densities on a chest x-ray, so a chest CT was performed to further  evaluate these.  The chest CT showed stable bilateral apical pleural  parenchymal scarring.  She had stable pleural thickening in the left  upper lobe laterally with no adenopathy and stable emphysematous changes  with no acute infiltrate.   Ms. Riehl had been on chlorthalidone prior to admission.  She stated  she took 50 mg at a time and sometimes more, but did not take it every  day.  Dr. Diona Browner felt that the chlorthalidone dose should be decreased  and the medication may possibly need to be discontinued.  Ms. Reardon  states that she  has problems with lower extremity edema if she  completely avoids the diuretics, so for now she will be on a lower dose.  Dr. Diona Browner discussed the patient with Dr. Myrtis Ser and felt Ms. Gutierrez  could be safely discharged home on August 23, 2007, with close outpatient  follow up.   DISCHARGE INSTRUCTIONS:  Activity level is to be increased gradually.  She is to stick to a heart-healthy diet.  She is to get a BMET at her  office visit with Dr. Myrtis Ser on Aug 29, 2007, at noon.  She is to follow up  with Dr. Jeannetta Nap as needed.   DISCHARGE MEDICATIONS:  1. K-Dur 20 mEq daily.  2. Chlorthalidone  50 mg one half tablet daily p.r.n.  3. Nexium 40 mg daily.  4. Verapamil 180 mg daily.  5. Iron 350 mg daily.  6. MiraLax 17 g p.r.n.  7. Vitamin D 50,000 units as prior to admission.  8. Carbamazepine 900 mg b.i.d.  9. Aspirin 81 mg a day.  10.Ocuvite, multivitamins, and Caltrate as prior to admission.  11.Albuterol p.r.n.  12.Phenergan 25 mg p.r.n.  13.Tylenol p.r.n.  14.Metoprolol 100 mg b.i.d.   15.Lisinopril 40 mg a day.  16.Zetia 10 mg a day.  17.Lovastatin 20 mg a day.      Theodore Demark, PA-C      Jonelle Sidle, MD  Electronically Signed    RB/MEDQ  D:  08/23/2007  T:  08/24/2007  Job:  [id number]   cc:   Windle Guard, M.D.

## 2010-09-09 NOTE — Assessment & Plan Note (Signed)
[facility]                            CARDIOLOGY OFFICE NOTE   JENILEE, FRANEY                      MRN:          [id number]  DATE:02/02/2007                            DOB:          1930/02/15    PRIMARY CARE PHYSICIAN:  This is a patient of Dr. Willa Rough.  Primary  care physician is Dr. Windle Guard.   SUBJECTIVE:  Ms. Gowell is a 77 year old female with history of  coronary artery disease, status post coronary artery bypass grafting in  2005.  She never had chest pain prior to her CABG and presented with  symptoms of shortness of breath, diaphoresis and a hot flash.  She was  given a sublingual nitroglycerin with relief of these symptoms. She was  admitted to [facility] overnight and ruled out for an MI.  She  was sent home and scheduled for an outpatient adenosine Cardiolite.  This showed normal contractility, thickening in all areas of the  myocardium with probable septal dyssynergy.  The overall left  ventricular function was normal.  This was considered a low risk  adenosine nuclear study.  She is scheduled to have cholecystectomy this  Friday, February 04, 2007, by Dr. Kendrick Ranch.  She has already gotten  cardiac clearance for this prior to her hospital admission but now she  has a normal Myoview scan as well.  Today she complains of a sinus  infection but has had no recurrent symptoms of diaphoresis, hot flash or  shortness of breath or chest pain.   CURRENT MEDICATIONS:  1. Zetia 10 mg daily.  2. Aspirin 81 mg, which is on hold.  3. Multivitamin daily.  4. Ocuvite daily.  5. Calcium 600 mg plus D daily.  6. Metoprolol 100 mg b.i.d.  7. Lisinopril 40 mg daily.  8. Verapamil 180 mg daily.  9. Lovastatin 20 mg daily.  10.Kay Ciel 20 mEq b.i.d.  11.Ferrous sulfate 350 mg daily.  12.Vitamin D 500 international units every two weeks.  13.Chlorthalidone 50 mg one to four daily.   PHYSICAL EXAMINATION:  GENERAL  APPEARANCE:  This is a pleasant 77-year-  old white female in no acute distress.  VITAL SIGNS:  Blood pressure 160/80 on arrival.  When I retook it the  blood pressure was 140/80, pulse 66, weight 164.  NECK: Is without jugular venous distention, HJR, bruit or thyroid  enlargement.  LUNGS:  Decreased breath sounds but clear anteriorly, posteriorly and  laterally.  HEART:  Regular rate and rhythm at 70 beats per minute.  Normal S1 and  S2.  Positive S4.  No significant murmur, bruit, rub, thrill or heave  noted.  ABDOMEN:  Soft without organomegaly, masses, lesions or abnormal  tenderness.  EXTREMITIES:  Without cyanosis, clubbing or edema.  She has good distal  pulses.   IMPRESSION:  1. Coronary artery disease status post coronary artery bypass grafting      in 2005 with left internal mammary artery to the left anterior      descending, saphenous vein graft to the diagonal, saphenous vein      graft  to the obtuse marginal 1 and obtuse marginal 2, saphenous      vein graft to the posterior descending artery.  Recent adenosine      Cardiolite on January 28, 2007 negative for low risk, negative for      ischemia.  2. History of preserved left ventricular function with ejection      fraction of 70% by Myoview.  3. Hypertension.  4. Hyperlipidemia.  5. Non-ST segment elevation myocardial infarction in 2004, status post      two stents to the right coronary artery.  6. History of nonobstructive bilateral internal carotid artery      stenosis.  7. History of lower gastrointestinal bleed secondary to arteriovenous      malformation's in 2004.  8. Cholelithiasis for surgery on February 04, 2007.  9. Ascending aortic aneurysm, 3.8 cm by CT scan in February 2007.   PLAN:  The patient is stable from a cardiac standpoint.  She will see  Dr. Myrtis Ser back in six months time.      Jacolyn Reedy, PA-C  Electronically Signed      Doylene Canning. Ladona Ridgel, MD  Electronically Signed   ML/MedQ  DD:  02/02/2007  DT: 02/02/2007  Job #: [id number]   cc:   Windle Guard, M.D.  Luis Abed, MD, [facility]  Timothy E. Earlene Plater, M.D.

## 2010-09-09 NOTE — Assessment & Plan Note (Signed)
[facility]                            CARDIOLOGY OFFICE NOTE   Karen, [name]                      MRN:          [id number]  DATE:08/29/2007                            DOB:          1929/07/30    [name] was recently hospitalized.  She had a syncopal episode.  While there, her rhythm was stable. She had taken a nitroglycerin and we  thought that this may have played a role.  Also her sodium was  decreased.  She does take chlorthalidone.  It is possible that she was  taking more than she needed.  With this in mind and in combination with  nitroglycerin, she may have had a syncopal episode.  She is stabilized.  Since being at home, she has not been using a diuretic other than on one  occasion when she had definite increased swelling in 1 foot.  She took  one dose.   PAST MEDICAL HISTORY:   ALLERGIES:  PENICILLIN, ZANTAC and CODEINE.   MEDICATIONS:  1. Zetia.  2. Aspirin.  3. Multivitamin.  4. Lisinopril 40.  5. Verapamil 180.  6. Lovastatin 20.  7. Iron.  8. Vitamin D.  9. Nexium.  10.Metoprolol 100 b.i.d.   OTHER MEDICAL PROBLEMS:  See the list below.   REVIEW OF SYSTEMS:  Other than some slight varying edema, she is feeling  well.  She has some mild shortness of breath 1 day.  This was on a day  that she did a lot of extra walking.  Otherwise the review of systems is  negative.   PHYSICAL EXAM:  Weight is 167 pounds which is up a pound or 2 on our  scale.  Blood pressure is 155/74 with a pulse of 52.  The patient is  oriented to person, time and place.  Affect is normal.  HEENT:  Reveals no xanthelasma.  She has normal extraocular motion.  There are no  No carotid bruits.  There is no jugular venous distention.  LUNGS:  Revealed distant lung sounds.  There is no respiratory distress.  CARDIAC:  Exam reveals an S1 with an S2.  There are no clicks or  significant murmurs.  ABDOMEN:  Soft.  She has trace peripheral edema in  her left lower extremity.   PROBLEMS:  1. Coronary disease. She underwent CABG in 2005. She had a Cardiolite      in 2008 that was low risk.  2. Normal LV function.  3. History of hypertension.  4. Hyperlipidemia.  5. Carotid disease.  In my last note of December 30, 2006, I felt it      was follow-up time for carotid Dopplers.  A report from December 30, 2006 reveals that she had 40-59% left internal carotid stenosis,      minor stenosis in the right and this is stable.  This can be      followed up a year from that date.  6. History of lower GI bleed secondary to AVMs in 2004.  7. Cholelithiasis surgery done in the fall of 2008.  8.  Ascending aortic aneurysm measuring only 3.8 cm by CT in February      2007.  She does not need follow-up testing of this at this time.  9. Recent hospitalization with syncope.  This may have been related to      volume and nitroglycerin.  We need to keep her heart rate in mind.      She is going to wear an event recorder.  10.Hyponatremia.  She is having a BMET done today.  I believe that      diuretic usage may play a role with this.  I made it clear to her      that it is okay to use a diuretic rarely if she has significant      increased swelling.  Otherwise for mild swelling this may be venous      and should be left alone without a diuretic.  11.COPD.  The patient will wear a monitor and will have a BMET and I      will see her for follow-up.     Luis Abed, MD, [facility]  Electronically Signed    JDK/MedQ  DD: 08/29/2007  DT: 08/29/2007  Job #: [id number]   cc:   Windle Guard, M.D.

## 2010-09-09 NOTE — H&P (Signed)
[name], [name]               ACCOUNT NO.:  [id number]   MEDICAL RECORD NO.:  [id number]          PATIENT TYPE:  INP   LOCATION:  3707                         FACILITY:  [facility]   PHYSICIAN:  Jonelle Sidle, MD DATE OF BIRTH:  04/13/1930   DATE OF ADMISSION:  01/23/2007  DATE OF DISCHARGE:                              HISTORY & PHYSICAL   PRIMARY CARE PHYSICIAN:  Dr. Jeannetta Nap.   PRIMARY CARDIOLOGIST:  Dr. Willa Rough.   CHIEF COMPLAINT:  Possible angina.   HISTORY OF PRESENT ILLNESS:  [name] is a 77 year old female with a  history of coronary artery disease.  She has never had chest pain, but  her anginal equivalent prior to her MI was left arm pain and a flushed  feeling.  Today, she was sitting in her kitchen after having been  cleaning and had sudden onset of a very hot feeling associated with  diaphoresis, nausea and presyncope.  She was short of breath with this.  There was no vomiting.  She called her daughter-in-law, who gave her a  sublingual nitroglycerin and she feels this improved her symptoms.  She  is no longer short of breath.  The hot feeling lasted about an hour.  She still does complain of some nausea, but no other symptoms at this  time.  She has chronic dyspnea on exertion, but this has not changed  recently.  She gets short of breath with activities around the house,  but never gets chest pain or left arm pain.  She also has not had a  flushed feeling like this in years.  She says except for the left arm  pain, this is exactly like her MI.   PAST MEDICAL HISTORY:  1. Status post aortocoronary bypass surgery in 2005 with LIMA to LAD,      SVG to diagonal, SVG to OM-1 and OM-2, SVG to PDA  2. Status post cardiac catheterization in April 2005 showing patent      grafts and no critical distal disease.  3. History of preserved left ventricular function with an EF of 70% by      Myoview, post CABG.  4. Hypertension.  5. Hyperlipidemia.  6. Ongoing  tobacco use.  7. Non-ST-segment elevation MI in 2004 with a stent x2 to the RCA.  8. History of nonobstructive bilateral ICA stenosis.  9. History of lower GI bleed secondary to AVMs with anticoagulation in      September 2004.  10.Cholelithiasis.  11.Ascending aortic aneurysm at 3.8 cm by CT in February 2007.   SURGICAL HISTORY:  She is status post cardiac catheterizations as well  as bypass surgery, left total hip replacement, colonoscopy and  polypectomy, EGD, hysterectomy and hemorrhoid surgery.   SOCIAL HISTORY:  She lives in [place] alone and is retired from [employer].  She has a greater than 50 pack-year history of ongoing tobacco use, but  denies alcohol or drug abuse.   ALLERGIES:  She is allergic or intolerant to CODEINE, PENICILLIN, SULFA  and ZANTAC.   CURRENT MEDICATIONS:  1. Metoprolol 100 mg q.12 h. (the patient thinks she  is taking 2      tablets q.12 h., but is not sure).  2. Lisinopril 40 mg a day.  3. Zetia 10 mg a day.  4. Lovastatin 20 mg a day.  5. Verapamil 180 mg daily.  6. K-Dur 20 mEq b.i.d.  7. Iron 325 mg daily.  8. Vitamin D 50,000 units every 2 weeks.  9. MiraLax p.r.n.  10.Aspirin 81 mg daily.  11.Chlorthalidone 50 mg daily (the patient ran out of and has not      refilled).  12.Ocuvite daily.  13.Centrum Silver and calcium plus D daily.  14.Albuterol MDI q.4 h. p.r.n.   FAMILY HISTORY:  Her mother died at age 69 with coronary artery disease  and her father died at age 64 of cancer.  She does have 1 brother that  is alive and has had bypass surgery.   REVIEW OF SYSTEMS:  The sweats, flushed feeling, shortness of breath are  as described above.  The dyspnea on exertion is chronic and there is  some orthopnea as well.  She chronically cough and wheezes on a daily  basis.  She has some chronic arthralgias.  She denies reflux symptoms,  hematemesis or hemoptysis, or melena recently.  Full 14-point review of  systems is otherwise negative.    PHYSICAL EXAM:  VITAL SIGNS: Temperature is 97, blood pressure 169/85,  pulse 51, respiratory 14, O2 saturation 97% on room air.  GENERAL:  She is a well-developed, elderly white female in no acute  distress.  HEENT:  Normal.  NECK:  There is no lymphadenopathy, thyromegaly or bruit noted, but she  has JVD that is marked on the left and minimal on the right.  CV:  Her heart is regular in rate and rhythm with an S1-S2 and no  significant murmur, rub or gallop is noted.  Distal pulses are intact in  all 4 extremities, but bilateral femoral bruits are appreciated.  LUNGS:  She has rales bilaterally, but no wheeze or crackles are noted.  ABDOMEN:  Soft and she has slight diffuse tenderness in all 4 quadrants,  slightly worse in the upper quadrant, but active bowel sounds with no  rebound or guarding.  EXTREMITIES:  There is no cyanosis, clubbing or edema noted.  MUSCULOSKELETAL:  There is no joint deformity or effusions and no spine  or CVA tenderness.  NEUROLOGIC:  She is alert and oriented.  Cranial nerves II-XII are  grossly intact.   CHEST X-RAY:  Pending at the time of dictation.   EKG:  Sinus bradycardia, rate 48, with no acute ischemic changes.   LABORATORY VALUES:  Are pending with the exception of a CBC, which is  within normal limits, and a D-dimer within normal limits at 0.29.   IMPRESSION:  [name] is a 77 year old female with a history of  bypass surgery in 2005 and hypertension, hyperlipidemia and carotid  disease.  She was seen in the office on December 30, 2006 for  preoperative exam with cholecystectomy pending in October.  Today, she  had sudden onset of a feeling of warmth, then nausea and shortness of  breath as well as diaphoresis.  Symptoms started while she was seated,  drinking tea.  She felt presyncopal, but did not pass out.  She took  nitroglycerin with possible response.  There was no left arm pain, which  was a major symptoms in the past.  Her EKG is  nonspecific and enzymes  are pending, but a D-dimer is negative.  Her chest x-ray preliminarily  is not acute.  She is hemodynamically stable.  She will be admitted to  Telemetry and cardiac enzymes will be cycled.  No heparin as yet because  her history of gastrointestinal bleed secondary to arteriovenous  malformations, unless her markers are positive.  Given these symptoms  and the plan for cholecystectomy soon, it is suspected that at a minimum  she will need a repeat Myoview.  If her enzymes are positive, she will  need heart catheterization.  She will be continued on her other home  medications and her chlorthalidone will be restarted.      Theodore Demark, PA-C      Jonelle Sidle, MD  Electronically Signed    RB/MEDQ  D:  01/23/2007  T:  01/24/2007  Job:  [id number]   cc:   Windle Guard, M.D.

## 2010-09-09 NOTE — H&P (Signed)
NAME:  [name], [name]               ACCOUNT NO.:  [id number]   MEDICAL RECORD NO.:  [id number]          PATIENT TYPE:  INP   LOCATION:  3739                         FACILITY:  [facility]   PHYSICIAN:  Doylene Canning. Ladona Ridgel, MD    DATE OF BIRTH:  06/27/1929   DATE OF ADMISSION:  08/20/2007  DATE OF DISCHARGE:                              HISTORY & PHYSICAL   ADMITTING DIAGNOSES:  Chest pain and syncope.   CHIEF COMPLAINT:  I passed out and I had chest pain and nausea.   HISTORY OF PRESENT ILLNESS:  The patient is a very pleasant 77 year old  woman with a history of coronary disease status post bypass surgery in  2005.  She has preserved LV function the past.  She has a history of  chest pain in the past.  She also has shortness of breath.  She was in  her usual state of health earlier today when she experienced substernal  chest pain.  Took an aspirin and nitroglycerin and passed out.  On  awakening she felt badly.  She had diaphoresis.  She presented to the  emergency room.  She had no specific complaints, otherwise except for  some very minimal residual ongoing chest pain.  Initial acute EKGs and  cardiac markers are negative.  She is admitted for additional  evaluation.  She denies associated nausea and vomiting.  She does have  diaphoresis.  She feels anxious and has felt fatigued more than usual  over the last day or two.  She is on a new medicine.  She does not know  what this is for her nerves and pain, which she states makes her sleepy.   CURRENT MEDICATIONS:  Include:  1. Zetia 10 a day.  2. Aspirin one a day.  3. Multiple vitamins.  4. Metoprolol 100 twice a day.  5. Lisinopril 40 a day.  6. Verapamil 180 a day.  7. Lovastatin 20 a day  8. Potassium 20 mEq twice daily  9. Iron tablets.  10.Chlorthalidone 50 mg once daily sometimes more.   FAMILY HISTORY:  Noncontributory at her advanced age.   SOCIAL HISTORY:  The patient continues to smoke cigarettes.  She smokes  about one  pack per day.  She denies alcohol abuse.   REVIEW OF SYSTEMS:  Notable for some mild arthritis.  She has fatigue,  and weakness, which is generalized and been present for a long time.  She has had a history of chest pain in the past with a stress Myoview  done back in October 2008, which was considered low risk.  Rest of her  review of systems was negative.   PHYSICAL EXAMINATION:  GENERAL: She is pleasant, elderly-appearing  woman, in no acute distress.  VITAL SIGNS: The blood pressure was initially 134/78, the pulse was 58  and regular, and the respirations were 18.  HEENT: Normocephalic and atraumatic.  Pupils equal, and round.  Oropharynx moist.  Sclerae anicteric.  NECK: No jugular vein distention.  No thyromegaly.  Trachea is midline.  Carotid are 2+ and symmetric.  CHEST: There is no wheezes, rales, or  rhonchi on lung exam.  There is no  increased work of breathing.  CARDIOVASCULAR: Reveals regular rate and rhythm with normal S1 and S2.  There is a soft systolic murmur at the right upper sternal border and  the left lower sternal border.  PMI was laterally displaced.  ABDOMEN: Soft, nontender, and nondistended.  There is no organomegaly.  Bowel sounds are present.  There is no rebound or guarding.  EXTREMITIES: Demonstrate no cyanosis, clubbing, or edema.  The pulses  are 2+ symmetric.  NEUROLOGIC: Alert and oriented x3.  Cranial nerves are intact.  Strength  is 5/5 and symmetric.   DIAGNOSTIC DATA:  The EKG demonstrates sinus rhythm (sinus bradycardia  with normal axis and intervals).   IMPRESSION:  1. Syncope after a nitroglycerin.  2. Chest pain like a prior coronary syndrome.  3. Borderline diabetes.  4. Hypertension.  5. Dyslipidemia.  6. Anemia.   DISCUSSION:  We will plan to admit the patient to telemetry bed and  observe her.  Obtain serial EKGs, and cardiac enzymes.  Continue her  home medications.  We will plan on catheterization for cardiac enzymes  are  positive.  If everything else is negative, then consider cardiac  event monitor to better understand whether her syncope was related to an  arrhythmia not although I suspect it was related to her nitroglycerin  intake.      Doylene Canning. Ladona Ridgel, MD  Electronically Signed     GWT/MEDQ  D:  08/20/2007  T:  08/21/2007  Job:  [id number]   cc:   Luis Abed, MD, [facility]

## 2010-09-09 NOTE — Assessment & Plan Note (Signed)
HEALTHCARE                            CARDIOLOGY OFFICE NOTE   [name], [name]                      MRN:          [id number]  DATE:10/13/2007                            DOB:          [birth year]    HISTORY OF PRESENT ILLNESS:  [name] returns today.  She had been  hospitalized, and she had a syncopal episode.  We thought nitroglycerin  played a role.  Also, she was taken off chlorthalidone.  She also had a  low sodium.  We saw her back in the office on Aug 29, 2007.  Sodium was  rechecked that day, and it was 129, which was improved for her.  Her low  sodium may well have been related to her diuretic.  She has not had any  recurrent syncope or presyncope.  She is not having any chest pain.  She  does feel fatigued in general.  Her blood pressure had been higher when  she saw Dr. Jeannetta Nap recently.  She wore an event recorder since I saw her  last.  She had no marked tachycardia.  She does have bradycardia, and  her average heart rate appeared to run in the range of 50.  There were  no marked pauses.  This was on metoprolol.  I am not sure of the exact  dose at that time.   PAST MEDICAL HISTORY:   ALLERGIES:  PENICILLIN, ZANTAC, and CODEINE.   MEDICATIONS:  1. Zetia.  2. Aspirin.  3. Multivitamins.  4. Lisinopril 40.  5. Verapamil 180.  6. Lovastatin 20.  7. Nexium 40.  8. Metoprolol 100 b.i.d.   OTHER MEDICAL PROBLEMS:  See the complete list below.   REVIEW OF SYSTEMS:  Fatigue is her major concern today.  Otherwise, her  review of systems is negative.   PHYSICAL EXAMINATION:  Blood pressure is 155/77 with a pulse of 64.  The patient is oriented to person, time, and place.  Affect is normal.  HEENT:  No xanthelasma.  She has normal extraocular motion.  There are  no carotid bruits.  There is no jugular venous distention.  LUNGS:  Clear.  Respiratory effort is not labored.  CARDIAC:  S1 with an S2.  There are no clicks or  significant murmurs.  ABDOMEN:  Soft.  She has no significant peripheral edema.   No labs were done today.  See the discussion above about her event  recorder.   PROBLEMS:  1. Coronary artery disease.  She  is post coronary artery bypass graft      in 2005 with a Cardiolite in 2008 that was low risk.  2. Normal left ventricular function.  3. History of hypertension.  As she is off the diuretic, her pressure      may run on the higher side.  We may need to make further      adjustments.  However, she is bradycardic, and we need to lower her      metoprolol dose.  4. Hyperlipidemia, treated.  5. Carotid disease.  She needs a followup carotid Doppler in September  2009.  6. History of lower gastrointestinal bleed secondary to arteriovenous      malformations in 2004.  7. Cholecystectomy in fall of 2008.  8. Ascending aortic aneurysm measuring 3.8 cm by CT in 2007 and this      can be followed.  9. Hospitalization in April 2009 with syncope.  See the discussion      above.  We believe that this was probably related to diuretics and      nitroglycerin.  Bradycardia may also have played a role, now that      we see that she does have sinus bradycardia.  She has had no      recurrence.  We will lower her metoprolol dose.  10.Hyponatremia.  This was improving when I saw her last.  She has no      significant edema at this time.  BMET will be checked today.  11.Chronic obstructive pulmonary disease and continued smoking.  It      would be optimal for her to stop smoking, and she is encouraged to      do so.   Metoprolol will be decreased from 100 b.i.d. to 50 b.i.d.  She will have  a BMET today, and I will see her for followup.     Luis Abed, MD, [facility]  Electronically Signed    JDK/MedQ  DD: 10/13/2007  DT: 10/14/2007  Job #: [id number]   cc:   Windle Guard, M.D.

## 2010-09-09 NOTE — Discharge Summary (Signed)
NAME:  [name], [name] NO.:  [id number]   MEDICAL RECORD NO.:  [id number]          PATIENT TYPE:  INP   LOCATION:  3707                         FACILITY:  [facility]   PHYSICIAN:  Luis Abed, MD, FACCDATE OF BIRTH:  [birth year]   DATE OF ADMISSION:  01/23/2007  DATE OF DISCHARGE:  01/24/2007                               DISCHARGE SUMMARY   PRIMARY CARDIOLOGIST:  Dr. Willa Rough   PRIMARY CARE PHYSICIAN:  Dr. Jeannetta Nap   PROCEDURES PERFORMED DURING HOSPITALIZATION:  None.   FINAL DISCHARGE DIAGNOSES:  1. Chest pain.        A:  Status post aortic coronary artery bypass grafting 2005 with  LIMA to LAD, SVG to diagonal, SVG to OM1 and OM2, SVG to PDA.        B:  Status post cardiac catheterization April of 2005 showing  patent grafts and no critical distal disease.        C:  History of preserved left ventricular function with an  ejection fraction of 70% by stress Myoview.  1. Hypertension.   SECONDARY DIAGNOSES:  1. Hyperlipidemia.  2. Ongoing tobacco abuse.  3. History of non-ST-elevated myocardial infarction 2004 with stent x2      to the right coronary artery.  4. History of nonobstructive bilateral ICA stenosis.  5. History of lower gastrointestinal bleed secondary to arteriovenous      malformations with anticoagulation in September of 2004.  6. Cholelithiasis.  7. Ascending aortic aneurysm at 3.5 cm by CT in February of 2007.   HOSPITAL COURSE:  This is a 77 year old Caucasian female with known  history of coronary artery disease who has never had chest pain, but her  angina equivalent prior to her MI was left arm pain and a flushed  feeling.  Today, the patient while being active all day long the day  before cooking and cleaning and delivering things to church was sitting  at the table drinking ice tea when she began to feel very hot associated  with diaphoresis, nausea and presyncope.  She had some mild shortness of  breath; there was no vomiting.   She did call her daughter-in-law who  gave her sublingual nitroglycerin and improved her symptoms somewhat.  As a result of this, the patient was brought to the emergency room for  further evaluation.  The symptoms were very similar to her symptoms  prior to her MI with the exception of left arm pain.  The patient was  seen and examined in the emergency room by Theodore Demark, PA and Simona Huh, cardiologist.  Secondary to the patient's history and  symptoms, the patient was admitted to rule out myocardial infarction.  Her chest x-ray did not show anything acute.  She was stable  hemodynamically.  They did not start her on heparin because of her  history of AV malformation and GI bleed unless her cardiac markers were  found to be positive which they were not.   The patient was seen and examined the following day and found to be  chest pain free.  Cardiac markers were found  to be negative with a  troponin of 0.1, 0.01 and 0.02, respectively.  Telemetry revealed sinus  bradycardia in the 50s with no acute ST-T wave abnormalities.  Chest x-  ray was stable with borderline cardiomegaly with vascular congestion and  changes of COPD, but no acute abnormality.  EKG remained stable and the  patient had no further complaints of flushing or presyncope which led  her to admission.  The patient was seen and examined by Dr. Willa Rough  on the day of discharge and found to be stable and to be discharged home  today with a followup stress Myoview this week.  The patient is  scheduled for a cholecystectomy on February 04, 2007 and had been seen in  the office prior to this for cardiac evaluation and clearance which she  had received.  Up until this time, the patient had been asymptomatic.   LABS ON DISCHARGE:  Hemoglobin 13.0, hematocrit 37.8, white blood cells  9.3, platelets 276.  Sodium 127, potassium 4.4, chloride 91, CO2 29, BUN  13, creatinine 0.88, glucose 109.  Troponin 0.01, 0.01, 0.02,   respectively.  CK 1.9, 1.9 and 2.2, respectively.  MB 31, 32 and 47,  respectively.   Of note, the patient was given one dose of IV Lasix secondary to  pulmonary vascular congestion and some mild lower extremity edema prior  to discharge.  She also was repleted with potassium p.o. x1.   DISCHARGE MEDICATIONS:  1. Aspirin 81 mg daily.  2. Lopressor 100 mg b.i.d.  3. Lisinopril 40 mg daily.  4. Zetia 10 mg daily.  5. Simvastatin 40 mg daily.  6. Verapamil 180 mg daily.  7. Ferrous sulfate 325 daily.  8. Hygroton 50 mg one to four tablets daily.  9. Potassium 10 mEq daily.  10.Ocuvite one tablet daily.  11.Multivitamins throughout the day.   ALLERGIES:  TO CODEINE, SULFA, PENICILLIN AND ZANTAC.   VITAL SIGNS:  Blood pressure on day of discharge 145/67, pulse 55,  respirations 20, temperature 98.2, O2 sat 94% on room air (this was  before IV Lasix).   FOLLOWUP PLANS AND APPOINTMENTS:  1. The patient is scheduled for a stress Myoview on October 3 at 9:00      a.m. at the [facility].  2. She is to follow up with Dr. Willa Rough or physician assistant on      October 18 at 11:15 a.m. for discussion of test results and      continued cardiac management.  3. The patient has had no changes in her medications and will continue      medications as directed prior to admission.   Time spent with the patient to include physician time:  45 minutes.      Bettey Mare. Lyman Bishop, NP      Luis Abed, MD, [facility]  Electronically Signed    KML/MEDQ  D:  01/24/2007  T:  01/24/2007  Job:  [id number]

## 2010-09-09 NOTE — Assessment & Plan Note (Signed)
[facility]                            CARDIOLOGY OFFICE NOTE   PORSCHIA, WILLBANKS                      MRN:          [id number]  DATE:12/21/2007                            DOB:          06-12-1929    Ms. Tonne today returns and she is stable.  She had been hospitalized  with a syncopal episode earlier in the year.  We saw her back in the  office in May and in June 2009.  She has been stable since then.  She  takes a small dose of a diuretic.  During the day, she has mild edema.  It goes away completely at night.  I am not inclined to change her  diuretic dosage.   PAST MEDICAL HISTORY:   ALLERGIES:  PENICILLIN, ZANTAC, and CODEINE.   MEDICATIONS:  See the complete list in the chart.  Medications include  Zetia, aspirin, multivitamin, calcium, lisinopril, verapamil,  lovastatin, Nexium p.r.n., chlorthalidone, potassium, iron, vitamin D,  Spiriva, and metoprolol.   OTHER MEDICAL PROBLEMS:  See the complete list in the note of October 13, 2007.   REVIEW OF SYSTEMS:  She has some fatigue.  Otherwise, her review of  systems is negative.   PHYSICAL EXAMINATION:  VITAL SIGNS:  Blood pressure today is 170/72 with  a pulse of 53.  GENERAL:  The patient is oriented to person, time, and place.  Affect is  normal.  HEENT:  Reveals no xanthelasma.  She has normal extraocular motion.  NECK:  There are no carotid bruits.  There is no jugular venous  distention.  LUNGS:  Clear.  Respiratory effort is not labored.  She has kyphosis of  her spine.  CARDIAC:  Reveals an S1 with an S2.  There is no significant murmur.  ABDOMEN:  Soft.  She has trace peripheral edema.   Problems are listed on the note of October 13, 2007.  I am not inclined to  change any of her medications at this point.  Her overall status is  stable.  I will see her back in 6 months.     Luis Abed, MD, [facility]  Electronically Signed    JDK/MedQ  DD: 12/21/2007  DT: 12/22/2007   Job #: [id number]   cc:   Windle Guard, M.D.

## 2010-09-09 NOTE — Assessment & Plan Note (Signed)
[facility]                            CARDIOLOGY OFFICE NOTE   LYAN, HOLCK                      MRN:          [id number]  DATE:02/08/2008                            DOB:          06-15-1929    [name] is here for followup of her blood pressure.   We have adjusted her medicines over time.  Recently her pressure was in  the range of 170 systolic.  She had cataract surgery and she tells me  that it was in this range when she first went in and she was anxious  about the procedure.  As she was ready to leave, her pressure was 130  systolic.  She is not having any major headaches or any major symptoms.   PAST MEDICAL HISTORY:   ALLERGIES:  PENICILLIN, ZANTAC and CODEINE.   MEDICATIONS:  Meds are listed in the flow sheet.   REVIEW OF SYSTEMS:  She is not having any fevers or chills.  She has no  GI or GU symptoms.  There are no musculoskeletal symptoms other than  some tightness of her neck.  Otherwise her review of systems is  negative.   PHYSICAL EXAMINATION:  VITAL SIGNS:  Blood pressure is 164/84.  GENERAL:  The patient is oriented to person, time, and place and affect  is normal.  HEENT:  No xanthelasma.  She has normal extraocular motion.  There are  no carotid bruits.  There are soft carotid bruits.  There is no jugular  venous distention.  LUNGS:  Clear.  Respiratory effort is not labored.  CARDIAC:  S1 with an S2.  There are no clicks or significant murmurs.  ABDOMEN:  Soft.  EXTREMITIES:  She has no peripheral edema.   The patient had a carotid Doppler done on January 27, 2008.  I have  reviewed the data.  She has stable bilateral carotid disease with 0-39%  in her right internal carotid and 40-59% in her left sternal carotid  artery.   Problems are listed on the note of October 13, 2007.  #3.  Hypertension.  She is on significant medicines for this.  I am  hesitant to change them further.  I will see her back in 8 weeks.  #5.  Carotid disease.  Her Dopplers are stable and no further workup is  needed.   I will see her back in 8 weeks to re-review all of her issues.  No  change in her meds.     Luis Abed, MD, [facility]  Electronically Signed    JDK/MedQ  DD: 02/08/2008  DT: 02/08/2008  Job #: [id number]   cc:   Windle Guard, M.D.

## 2010-09-09 NOTE — Assessment & Plan Note (Signed)
[facility]                            CARDIOLOGY OFFICE NOTE   [name], [name]                      MRN:          [id number]  DATE:04/04/2008                            DOB:          October 20, 1929    [name] is seen for followup.  She is stable.  Her blood pressure  is remaining the same.  She has asked me about Zetia.  We discussed all  of the issues and have decided in her case to stop her Zetia.   PAST MEDICAL HISTORY:   ALLERGIES:  See the flow sheet.   MEDICATIONS:  See the flow sheet.   REVIEW OF SYSTEMS:  She is not having any GI or GU symptoms.  She has no  fevers, chills, or headaches.  Her review of systems is negative.   PHYSICAL EXAMINATION:  VITAL SIGNS:  Blood pressure is 160/86 with a  pulse of 64.  GENERAL:  The patient is oriented to person, time, and place.  Affect is  normal.  HEENT:  No xanthelasma.  She has normal extraocular motion.  NECK:  There are no carotid bruits.  There is no jugular venous  distention.  LUNGS:  Clear.  Respiratory effort is not labored.  CARDIAC:  An S1 with an S2.  There are no clicks or significant murmurs.  ABDOMEN:  Soft.  EXTREMITIES:  She has no peripheral edema.   Problems are listed on the prior notes and listed completely on the note  of October 13, 2007.  Her overall cardiac status is stable.  I believe, we  should not push her medicines any higher.  She will be stopping her  Zetia.  I will see her back in 6 months.     Luis Abed, MD, [facility]  Electronically Signed    JDK/MedQ  DD: 04/04/2008  DT: 04/04/2008  Job #: [id number]   cc:   Windle Guard, M.D.

## 2010-09-09 NOTE — Op Note (Signed)
NAME:  [name], [name]               ACCOUNT NO.:  [id number]   MEDICAL RECORD NO.:  [id number]          PATIENT TYPE:  OIB   LOCATION:  1534                         FACILITY:  [facility]   PHYSICIAN:  Timothy E. Earlene Plater, M.D. DATE OF BIRTH:  1929-12-26   DATE OF PROCEDURE:  02/04/2007  DATE OF DISCHARGE:                               OPERATIVE REPORT   PREOP DIAGNOSIS:  Cholecystolithiasis.   POSTOP DIAGNOSIS:  Choledocho-cholecystolithiasis.   OPERATIVE PROCEDURE:  Laparoscopic cholecystectomy and operative  cholangiogram.   SURGEON:  Timothy E. Earlene Plater, M.D.   ASSISTANT:  Adolph Pollack, M.D.   ANESTHESIA:  General.   HISTORY:  [name] is 41 with stable cardiac disease.  She has had a  syndrome of nausea, weight loss, and some right upper quadrant pain.  Gallstones were diagnosed.  No other disease was found.  She is prepared  for gallbladder surgery, today, has been seen, identified, and the  permit signed.   DESCRIPTION OF PROCEDURE:  The patient taken to the operating room,  placed supine.  General endotracheal anesthesia administered.  The  abdomen was prepped and draped in the usual fashion.  An infraumbilical  incision was made vertically.  The peritoneum entered without  complication.  Hassan catheter placed, tied in place with a #1 Vicryl,  and the abdomen insufflated.  A second 10 mm trocar placed in the right  upper quadrant, adhesions in the right upper quadrant were taken down  sharply, and then two 5 mm trocars placed in the right upper quadrant.   The gallbladder was full, rather tense, and had chronic changes.  It was  grasped, placed on tension, adhesions were taken down.  The fatty tissue  around the cystic duct was carefully dissected.  The cystic duct  encircled round and about, a clip placed on the gallbladder side of the  cystic duct, the cystic duct opened, catheter placed percutaneously into  the cystic duct remnant.  A clip applied.  Also the cystic  artery was  isolated easily, behind the cystic duct, it was triply clipped but not  divided.  Cholangiogram was made with real-time fluoroscopy showing one  or more common duct stones between the common bile duct and the common  hepatic duct.  A second cholangiogram was made after flushing with  saline, the stones were still present.  The dye did entry into the  duodenum.  Otherwise the anatomy was normal.   The clip and catheter removed.  The cystic duct remnant triply clipped  and divided.  Cystic artery divided, the second posterior branch of the  cystic artery divided between clips.  The gallbladder removed from the  gallbladder bed without complication or incident.  It was placed in an  EndoCatch bag, and then removed from the abdomen through the  infraumbilical incision.  That was tied under direct vision.  Copious  irrigation carried out until clear.  Gallbladder bed was dry, and there  were no complications noted.  All clips and placed.   All irrigant, CO2, instruments, and trocars removed under direct vision.  Each incision checked and closed  with Monocryl and Steri-Strips.  Final  counts correct.  She tolerated it well.  She was removed to recovery  room in good condition.  The patient will be admitted, seen by [facility]  GI service for ERCP.      Timothy E. Earlene Plater, M.D.  Electronically Signed     TED/MEDQ  D:  02/04/2007  T:  02/05/2007  Job:  [id number]   cc:   Windle Guard, M.D.  Fax: [phone]   Luis Abed, MD, [facility]  [address]  [address]  [place]  [place] [address]   [facility]

## 2010-09-09 NOTE — Assessment & Plan Note (Signed)
[facility]                            CARDIOLOGY OFFICE NOTE   [name], [name]                      MRN:          [id number]  DATE:12/30/2006                            DOB:          Sep 24, 1929    [name] is seen today for cardiac assessment and for clearance for  gallbladder surgery.  She had been seen by Dr. Samule Ohm in the past.  Dr.  Samule Ohm has moved to [facility] and I will be taking over her cardiology  care.  The patient had an inferior MI and underwent CABG in January of  2005.  She has normal systolic LV function by history.  She continues to  smoke despite multiple efforts to help her stop.  She does have parotid  disease that is mild to moderate and she will have a followup Doppler.  She has hypercholesterolemia.   The patient has not been having any significant chest pain.  Her  original symptom was severe left arm pain, and she is not having any of  this.  She does have some shortness of breath that is chronic, and  certainly related to her smoking.  She has had no syncope or pre-  syncope.   PAST MEDICAL HISTORY:   ALLERGIES:  PENICILLIN, ZANTAC, and CODEINE.   MEDICATIONS:  1. Zetia.  2. Aspirin.  3. Metoprolol.  4. Lisinopril.  5. Verapamil.  6. Chlorthalidone.  7. Lovastatin.  8. KCl.  9. Iron.  10.Vitamin D.   OTHER MEDICAL PROBLEMS:  See the list below.   REVIEW OF SYSTEMS:  Other than the HPI, her review of systems is  negative.  She does complain of some decrease energy.   PHYSICAL EXAM:  Weight is 164 pounds, blood pressure 138/63 with a pulse  of 50.  The patient is oriented to person, time, and place.  Affect is normal,  but her response to questions leads to a slow but definite response.  HEENT:  Reveals no xanthelasma.  She has normal extraocular motions.  She has soft right carotid bruit.  There is no jugular venous  distension.  LUNGS:  Reveal mildly decreased breath sounds.  She has kyphosis of  her  spine.  CARDIAC:  Reveals an S1 with an S2.  There are no clicks or significant  murmurs.  ABDOMEN:  Soft.  She has no masses or bruits.  There are normal bowel  sounds.  She has decreased distal pulses.  There is no significant peripheral  edema.   EKG is to be done shortly and reviewed.   PROBLEMS:  1. Coronary disease, history of coronary artery bypass grafting in      2005.  2. Good left ventricular function.  3. Smoking.  Unfortunately, this continues despite all our efforts.  4. Parotid disease.  It is now time for followup Dopplers.  Her last      Doppler was done in September of 2007 and she had 0-39% right      stenosis, and 40-59% left internal carotid stenosis.  5. Hypertension.  Treated.  6. Hypercholesterolemia.  On medication.  7. Need  for gallbladder surgery.  The patient's cardiac status is      stable.  She is cleared for cardiac surgery.  This, of course, is      pending my review of her EKG to be done shortly.     Luis Abed, MD, [facility]  Electronically Signed    JDK/MedQ  DD: 12/30/2006  DT: 12/30/2006  Job #: [id number]   cc:   Sheppard Plumber. Earlene Plater, M.D.  Windle Guard, M.D.

## 2010-09-10 ENCOUNTER — Ambulatory Visit (INDEPENDENT_AMBULATORY_CARE_PROVIDER_SITE_OTHER)
Admission: RE | Admit: 2010-09-10 | Discharge: 2010-09-10 | Disposition: A | Discharge status: Home / Self Care | Payer: MEDICARE | Source: Ambulatory Visit | Attending: Adult Health | Admitting: Adult Health

## 2010-09-10 DIAGNOSIS — J449 Chronic obstructive pulmonary disease, unspecified: Secondary | ICD-10-CM

## 2010-09-10 DIAGNOSIS — J4489 Other specified chronic obstructive pulmonary disease: Secondary | ICD-10-CM

## 2010-09-10 MED ORDER — IOHEXOL 300 MG/ML  SOLN
100.0000 mL | Freq: Once | INTRAMUSCULAR | Status: AC | PRN
  Administered 2010-09-10: 100 mL via INTRAVENOUS

## 2010-09-12 ENCOUNTER — Encounter: Payer: Self-pay | Admitting: Cardiology

## 2010-09-12 NOTE — Discharge Summary (Signed)
NAMEJORDI, [name]               ACCOUNT NO.:  [id number]   MEDICAL RECORD NO.:  [id number]          PATIENT TYPE:  INP   LOCATION:  2906                         FACILITY:  [facility]   PHYSICIAN:  Salvadore Farber, M.D. LHCDATE OF BIRTH:  11-20-1929   DATE OF ADMISSION:  06/14/2005  DATE OF DISCHARGE:  06/15/2005                                 DISCHARGE SUMMARY   PRINCIPAL DIAGNOSIS:  Chest pain.   OTHER DIAGNOSES:  1.  Coronary artery disease, status post coronary artery bypass grafting in      January 2005.  2.  Hypertension.  3.  Hyperlipidemia.  4.  Ongoing tobacco abuse with 30 pack year history.  5.  Peripheral vascular disease with 60% stenosis in the left internal      carotid artery in 2004.  6.  Chronic obstructive pulmonary disease.  7.  History of lower gastrointestinal bleed secondary to cecum arteriovenous      malformations.  8.  Macular degeneration with left hip replacement.  9.  Ascending aortic arch aneurysm measuring 3.8 cm.   HISTORY OF PRESENT ILLNESS:  A 77 year old white female with prior history  of coronary artery disease status post coronary artery bypass grafting in  January of 2005. Beginning at approximately 9:00 p.m. on June 14, 2005,  she began to experience chest pain with radiation to her jaw and  diaphoresis. The pain was sharp and severe. Rated a 10/10 without shortness  of breath, nausea, vomiting, and without relief with sublingual  nitroglycerin. There was no worsening of the pain with exertion. She  eventually called EMS and was treated with 3 sublingual nitroglycerin and 4  baby aspirin without change in discomfort. In the emergency room, she was  still complaining of 8-10/10 chest pain and was initiated on intravenous  nitroglycerin infusion. EKG showed no acute changes and she was admitted for  further evaluation. Notably, this pain was different from her previous  anginal equivalent.   HOSPITAL COURSE:  [name] was admitted  to step down and ruled out for  myocardial infarction by cardiac markers x3. She is currently pain free this  morning. She reports this morning that she had a recent GI illness including  nausea, vomiting, and diarrhea for the past week with poor p.o. intake and  resolution of symptoms this past Saturday. She notes that she is still weak  as a result of this;, however, and thinks that the pain that she experienced  yesterday or last night was more like what she had been experiencing over  the past week. She states that she is ready to go home. She has been set up  for an outpatient adenosine Myoview and will be discharged home today in  satisfactory condition.   LABORATORY DATA:  On discharge hemoglobin and hematocrit 11.4 and 34.1.  White blood cell count 7.0. Platelets 201,000. Sodium 141, potassium 2.8,  chloride 102, CO2 30, BUN 10, creatinine 1.0, glucose 104, total bilirubin  0.3, alkaline phosphatase 53, AST 48, ALT 49, total protein 5.2, albumin  3.0, calcium 9.2, magnesium 1.8. Peak CK 50, peak MB 2.5,  troponin I 0.02.  BNP 73.7. TSH 0.975.   DISPOSITION:  The patient is being discharged home today in good condition.   FOLLOW UP:  1.  She has an adenosine Myoview scheduled for Friday, June 19, 2005, at      9:45 a.m. with [facility].  2.  She also has a B-met that day to followup her hypokalemia, which was      seen this morning and replaced prior to discharge.  3.  She has a followup appointment with Dr. Samule Ohm on June 30, 2005, at 1:45      p.m.   DISCHARGE MEDICATIONS:  1.  Aspirin 81 mg daily.  2.  Lisinopril 20 mg daily.  3.  Multivitamins 1 daily.  4.  Ocuvite daily  5.  Vitamin D daily.  6.  Lopressor 50 mg twice daily.  7.  Lipitor 80 mg daily.  8.  Albuterol every 4 hours as needed.  9.  Chantix 0.5 mg daily x3 days, then twice daily b.i.d. x4 days, then 1 mg      twice daily.   OUTSTANDING LABORATORY STUDIES:  None.   DURATION DISCHARGE  ENCOUNTER:  40 minutes including physician time.      Ok Anis, NP      Salvadore Farber, M.D. [facility]  Electronically Signed    CRB/MEDQ  D:  06/15/2005  T:  06/15/2005  Job:  [id number]   cc:   Windle Guard, M.D.  Fax: [phone]   Salvadore Farber, M.D. [facility]  [address]  [address]  [place]  [place] [address]

## 2010-09-12 NOTE — Cardiovascular Report (Signed)
NAME:  [name], GRUHN                         ACCOUNT NO.:  [id number]   MEDICAL RECORD NO.:  [id number]                   PATIENT TYPE:  INP   LOCATION:  2903                                 FACILITY:  [facility]   PHYSICIAN:  Salvadore Farber, M.D.             DATE OF BIRTH:  21-May-1929   DATE OF PROCEDURE:  12/26/2002  DATE OF DISCHARGE:                              CARDIAC CATHETERIZATION   PROCEDURE:  Left heart catheterization, left ventriculography, abdominal  aortography, coronary angiography.   INDICATIONS:  [name] is a 77 year old lady with no prior history of  cardiovascular disease.  Risk factors are hypertension and smoking.  She now  presents with non-ST-segment elevation myocardial infarction.  She was  treated with heparin, aspirin, and eptifibatide.  During that combined drug  therapy she had a GI bleed with drop in her hematocrit from 46 to 30.  All  anticoagulant therapy was stopped with apparent cessation of the bleed.  She  was hemodynamically stable throughout.  She is brought to the laboratory  today for diagnostic angiography.   PROCEDURAL TECHNIQUE:  Informed consent was obtained.  Under 1% lidocaine  local anesthesia, a 5-French sheath was placed in the right femoral artery  using the modified Seldinger technique.  Diagnostic angiography and  ventriculography were performed using JL4, JR4, and pigtail catheters.  The  patient tolerated the procedure well and was transferred to the holding room  in stable condition.  Sheaths will be removed there.   COMPLICATIONS:  None.   FINDINGS:  1. Left main:  Angiographically normal.  2. LAD:  Moderate-sized vessel giving rise to a single diagonal branch.  The     proximal vessel has an ulcerated 70% stenosis which does not involve the     ostium.  There is a 40% stenosis of the mid vessel.  3. Circumflex:  Moderate-sized vessel giving rise to two obtuse marginals.     The AV groove circumflex after the first  marginal has a 40% stenosis.     The first marginal has an 80% stenosis in its mid section.  This first     marginal is a 2-2.25 mm vessel.  4. RCA:  The RCA is a large, dominant vessel.  There is diffuse disease of     the proximal and mid vessel up to 40%.  There is a focal 99% stenosis of     the distal vessel before the takeoff of the PDA.  5. LV:  123/4/7.  EF 70% without regional wall motion abnormality.  6. No aortic stenosis or mitral regurgitation.   IMPRESSION/RECOMMENDATIONS:  The culprit lesion is that at the distal right  coronary artery.  The proximal left anterior descending is also concerning  for plaque rupture. Recommend PCI of both RCA and LAD once bleeding issues  are resolved.  Will plan medical therapy for the obtuse marginal lesion.  Salvadore Farber, M.D.    WED/MEDQ  D:  12/26/2002  T:  12/26/2002  Job:  [id number]   cc:   Dr. Jeannetta Nap

## 2010-09-12 NOTE — Discharge Summary (Signed)
NAME:  [name], [name]                         ACCOUNT NO.:  [id number]   MEDICAL RECORD NO.:  [id number]                   PATIENT TYPE:  INP   LOCATION:  4735                                 FACILITY:  [facility]   PHYSICIAN:  Arturo Morton. Riley Kill, M.D.             DATE OF BIRTH:  [birth year]   DATE OF ADMISSION:  12/25/2002  DATE OF DISCHARGE:  12/31/2002                           DISCHARGE SUMMARY - REFERRING   DISCHARGE DIAGNOSES:  1. Admitted with non-Q wave myocardial infarction/chest pain.  2. Lower gastrointestinal bleed with anemia/hematochezia after treatment     with heparin and Integrilin.  3. Coronary artery disease with the culprit lesion in the distal right     coronary artery 99%, residual disease in the proximal left anterior     descending artery (LAD) 70% and an obtuse marginal with an 80% midpoint     stenosis.  4. Abnormal chest x-ray with a follow up CT scan showing scaring at both     apices.   SECONDARY DIAGNOSES:  1. History of colonoscopy status post polypectomy.  2. Hypertension.  3. Chronic obstructive pulmonary disease/tobacco habituation.  4. Gastroesophageal reflux disease.  5. Macular degeneration.  6. History of migraines.  7. Status post hemorrhoidectomy.  8. Status post vaginal hysterectomy.  9. Status post left total hip replacement.  10.      Status post tonsillectomy and adenoidectomy.  11.      Status post DNL.  12.      Status post removal of facial skin cancers.  13.      Dyslipidemia.  14.      History of cecal arteriovenous malformations.   PROCEDURES:  1. December 26, 2002: Left heart catheterization.  The study showed ejection     fraction of greater than 70%.  No aortic stenosis.  No mitral     regurgitation.  The distal right coronary artery (RCA) 99% focal stenosis     before takeoff of the posterior descending coronary artery (PDA), the     proximal left anterior descending artery (LAD) had a 70% ulcerated     stenosis proximally.   The first obtuse marginal had an 80% midpoint     stenosis.  2. December 27, 2002: Emergent percutaneous coronary intervention (PCI) and     stenting of the distal-and-mid right coronary artery; placement of a 3.0     x 13 Hepacoat stent in midlesion; and a 2.25 x 13 Hepacoat stent at the     distal right coronary artery.  The proximal stenosis reduced from 60-0%.     The distal stenosis from 99% to 10-20%.  3. December 28, 2002: Esophagogastroduodenoscopy by Dr. Lina Sar.  There     was a normal esophagus, stomach, and duodenum.  The patient may be     restarted on Plavix.  4. December 30, 2002: Ultrasound of the left groin -- negative for a  pseudoaneurysm.   The patient has been doing well since the stenting of the culprit lesion of  the right coronary artery.  She has had no further chest pain.  She has been  seen this hospitalization with a smoking cessation counselor and also by the  GI service, Dr. Lina Sar for a lower GI bleed.  She will have follow up  with Dr. Juanda Chance in about 6 weeks for a repeat colonoscopy; and she will see  Dr. Samule Ohm the week of Labor Day for immediate follow up including a  complete blood count and to discuss the possibility of addressing residual  disease.   DISCHARGE MEDICATIONS:  1. Plavix 75 mg daily for 12 days.  2. Enteric coated aspirin 81 mg daily.  3. Protonix 40 mg daily.  4. Toprol XL 100 mg daily.  5. Lipitor 80 mg daily at bedtime.  6. Lisinopril 10 mg daily.  7. Combivent inhaler 2 puffs twice daily.  8. Wellbutrin 150 mg by mouth on September 5; and then starting 150 mg twice     daily on September 6.   DISCHARGE INSTRUCTIONS:  1. Activities:  Walk daily to keep up her strength.  2. Diet: Low sodium, low cholesterol diet.  3. She is to engage in no heavy lifting for the next week.  No vigorous     activity, for example, for 1 month.  4. Once again, she has follow up with Dr. Samule Ohm during the week of Labor     Day,  preferably Wednesday or Thursday, September 8th or 9th.  Dr.     Melinda Crutch office will call to make that appointment.  She will also have a     complete blood count at that time.  5. She will also return to see Dr. Lina Sar in 6 weeks to schedule follow     up colonoscopy.   BRIEF HISTORY:  [name] is a 77 year old female with a history of  hypertension and ongoing tobacco habituation.  She presents with 15/10  substernal chest pain involving the left upper extremity.  She also has  diaphoresis and nausea.  She has had chest pain which was mild to  intermediate for the past week; but, starting on the evening of her  admission it began to be very severe.  Her daughter dove her to the  emergency room.  She was admitted and started on heparin, Integrilin,  aspirin, and beta blocker.  A lipid profile was obtained, and cardiac  enzymes were cycled.  She was scheduled for left heart catheterization.   HOSPITAL COURSE:  [name] was admitted to [facility] in  the early hours of August 30.  She was found to have elevated cardiac  enzymes with a non-Q wave myocardial infarction.  She was scheduled for left  heart catheterization.  She was started on heparin and Integrilin.  Her  first bowel movement, later in the day on August 30, she had hematochezia.  A GI consult was obtained.  Catheterization was performed as scheduled with  esophagogastroduodenoscopy to follow the next day.  Left heart  catheterization showed a focal stenosis of 99% in the distal right coronary  artery, a proximal ulcerated stenosis of about 70% in the LAD and a midpoint  80% stenosis in the first obtuse marginal.  PCI was planned once bleeding  issues were resolved.   However, in the early morning hours of September 1 the patient had recurrent  chest pain recalcitrant to  nitroglycerin treatment. It was noted that  electrocardiogram showed ST segment elevations in the inferior leads  with reciprocal ST depressions in the precordial leads.  The patient was  transfused with 1 unit of packed red blood cells with another to follow.  She was taken emergently to the catheterization lab on September 1st, where  stenting of the mid-and-distal right coronary artery were performed. The  patient subsequently underwent EGD which showed normal esophagus, stomach,  and duodenum.  Impression was of a GI bleeding, probably lower GI, in a  patient with AVMs.  The patient was cleared to take Plavix for 2 weeks as  suggested by cardiology in addition to a baby aspirin daily.   The patient was scheduled for discharged on September 4th with follow up  colonoscopy in 6 weeks; however, it was noted that the left groin  catheterization site was indurated.  An ultrasound study showed no evidence  of  pseudoaneurysm.  The patient was subsequently scheduled for discharge  September 5th.  She goes home with the medications and follow up as dictated  above.  The patient is advised to call [place] [facility] to schedule  colonoscopy follow up in 6 weeks.      Maple Mirza, P.A.                    Arturo Morton. Riley Kill, M.D.    GM/MEDQ  D:  12/31/2002  T:  12/31/2002  Job:  [id number]   cc:   Windle Guard, M.D.  [address]  [place], [place] [address]  Fax: [phone]   Salvadore Farber, M.D.

## 2010-09-12 NOTE — Consult Note (Signed)
NAME:  [name], [name]                         ACCOUNT NO.:  [id number]   MEDICAL RECORD NO.:  [id number]                   PATIENT TYPE:  INP   LOCATION:  2021                                 FACILITY:  [facility]   PHYSICIAN:  Gwenith Daily. Tyrone Sage, M.D.            DATE OF BIRTH:  Jul 10, 1929   DATE OF CONSULTATION:  05/21/2003  DATE OF DISCHARGE:                                   CONSULTATION   REQUESTING PHYSICIAN:  Dr. Juanda Chance.   FOLLOWUP CARDIOLOGIST:  Salvadore Farber, M.D.   PRIMARY CARE PHYSICIAN:  Windle Guard, M.D.   REASON FOR CONSULTATION:  Three-vessel coronary artery disease, recurrent.   HISTORY OF PRESENT ILLNESS:  The patient is a 77 year old female who  suffered inferior myocardial infarction and underwent urgent angioplasty  with stent placement x2 in the right coronary artery on the early morning  hours of December 27, 2002.  She has done well until the past 10 days when  she began having nightly chest discomfort,  radiating mostly to the left arm  with mild dyspnea.  Usually it would resolve with rest and 1 nitroglycerin  sublingual.  Because of the recurrent nature and the similarity of this pain  which she noted in August and September of 2004, she made an appointment and  was seen in the [place] office and transferred urgently on August 16, 2003.   On this current admission, her peak CK was 129 with MB of 2.8, troponin of  0.02.  Previous cardiac cath was August 31 and December 27, 2002.  In  November of 2004 she had a portal adenosine stress test with ejection  fraction of 60%, negative for ischemia.  Cardiac risk factors include  hypertension, hyperlipidemia.  She denies diabetes.  Has a strong smoking  history of greater than 50 pack years, and continues to smoke up until this  admission.  Her family history is positive for coronary artery disease.  Mother denied at age 78 of coronary disease.  One brother in his mid 41s has  had bypass.  She denies any  previous stroke.  She does have claudication.  She has moderate COPD.  No renal insufficiency.   PAST MEDICAL HISTORY:  Significant for:  1. Lower GI bleed complicating her course in August of 2004.  She was found     to have an AVM in the cecum.  She had a normal upper GI endoscopy.  She     has also had a polypectomy.  2. Though not documented on her current chest x-ray, she had an x-ray and     patient reports that she had a suspicious x-ray and ultimately underwent     a CT scan of the chest ordered by Dr. Jeannetta Nap done at [facility]     sometime in September.   PAST SURGICAL HISTORY:  Includes hemorrhoidectomy, vaginal hysterectomy,  left total hip replacement in 1995.  __________ from the rectal  area.   SOCIAL HISTORY:  The patient lives alone in [place], but has a supportive  family.  She is retired from [employer].   MEDICATIONS:  The patient was put on Plavix for 2 weeks following  angioplasty, but is not currently taking it.  She is on vitamin E,  lisinopril 40 mg a day, atenolol 100 mg a day, Mucinex 600 mg a day,  multivitamins, calcium, 81 mg of aspirin, Zetia 10 mg a day, Lipitor 80 mg a  day, Fosamax 70 mg once a week, Combivent inhaler 2 puffs b.i.d. as needed.   ALLERGIES:  PENICILLIN causes itching nose.  ZANTAC causes rash and itching.  CODEINE causes nausea.  SULFA, the patient was unclear what reaction she had  to this.   REVIEW OF SYSTEMS:  CARDIAC:  Cardiac review of systems is positive for  chest pain and arm pain, occasional exertional shortness of breath.  Denies  resting shortness of breath.  Denies orthopnea, syncope, or palpitations.  Does have mild lower extremity edema. With chest pain does have bouts of  nausea.  GENERAL:  No change in weight recently.  RESPIRATORY:  The patient  smokes and coughs in the mornings.  Denies hemoptysis.  As noted above, a  recent CT scan of the chest was done at [facility].  GASTROINTESTINAL:  History of known AVM of the  cecum.  No recent bleeding.  Colonoscopy  April 02, 2003, showed multiple tics.  NEUROLOGIC:  Denies TIs or  amaurosis.  MUSCULOSKELETAL:  Bilateral knee and lower extremity pain with  ambulation.  Previous left hip replacement.  GU:  History of chronic  cystitis.  Denies any recent infections.  Denies any hematologic or easy  bruisability.  Denies any endocrine changes.  Denies diabetes.  Denies any  psychiatric history.  Has known carotid disease which is asymptomatic with  duplex September 4 showing a 60% left internal carotid artery stenosis.  Has  a history of macular degeneration.   PHYSICAL EXAMINATION:  VITAL SIGNS:  Blood pressure 140/68, pulse 64 and  regular, respiratory rate is 18, O2 saturation is 93%.  The patient is 5  feet 11 inches tall and weighs 181 pounds.  GENERAL:  Appears to be a chronic smoker on general exam.  HEENT:  Pupils equal, round, reactive to light.  NECK:  Without carotid bruits.  LUNGS:  Clear at this time, though the patient does note occasionally she  wheezes.  CARDIAC:  Regular rhythm without murmur or gallop.  ABDOMEN:  Benign without palpable masses or tenderness.  SKIN:  She has no ischemic skin changes.  Has an incision on the left hip  from hip replacement.  LYMPH NODES:  No palpable cervical or supraclavicular lymph nodes.  VASCULAR:  There is +1 right DP, +2 left DP, +1 right posterior tibial, +1  left posterior tibial.   LABORATORY FINDINGS:  White count 6.4, hemoglobin 10, hematocrit 30,  platelets 196,000.  Total cholesterol 189, HDL 34, LDL 130.  Sodium 136,  potassium 3.8, creatinine 0.9.  Chest x-ray reports changes of COPD.  The  patient's films were reviewed with Dr. Juanda Chance.  The patient has a 60% to 70%  left main, 70% to 80% LAD, small diagonal 70%, 80% circumflex, and a 90% in  the mid circumflex, and obtuse marginal of 80%.  Right coronary artery has a mid 50% distal, at the site of the stent 95% stenosis with reasonable  distal  vessel.  Overall ejection fraction preserved.   IMPRESSION:  Patient with 3-vessel coronary artery disease including some  degree of left main obstruction, now with restenosis of stent in the right  coronary artery with high-grade stenosis and recurrent symptoms.  I have  reviewed the films and history with the patient and agree with the  recommendation that we proceed with coronary artery bypass grafting as the  best long-term solution, although the patient is at some increased risk of  surgery because of her strong smoking history.  I have discussed with her  smoking cessation, the risks of surgery including death, infection, stroke,  myocardial infarction, bleeding, blood transfusion.  She is aware of these  complications and is willing to proceed with bypass surgery.   PLAN:  We will plan to proceed on May 22, 2003.  The patient is  agreeable.                                               Gwenith Daily Tyrone Sage, M.D.    Tyson Babinski  D:  05/21/2003  T:  05/21/2003  Job:  [id number]

## 2010-09-12 NOTE — H&P (Signed)
NAME:  [name], [name] NO.:  [id number]   MEDICAL RECORD NO.:  [id number]          PATIENT TYPE:  EMS   LOCATION:  MAJO                         FACILITY:  [facility]   PHYSICIAN:  Lorain Childes, M.D. LHCDATE OF BIRTH:  1929/10/20   DATE OF ADMISSION:  06/14/2005  DATE OF DISCHARGE:                                HISTORY & PHYSICAL   PRIMARY CARDIOLOGIST:  Dr. Samule Ohm   PRIMARY CARE PHYSICIAN:  Dr. Windle Guard   CHIEF COMPLAINT:  Chest pain.   HISTORY OF PRESENT ILLNESS:  Patient is a 77 year old female with known  coronary disease status post CABG in January 2005 who presents to the ER  with chest pain.  Patient reports she had chest pain beginning around 9 p.m.  She also felt clammy and her jaw began to hurt.  She describes the pain as  sharp and severe greater than 10/10.  It did not radiate to her arm.  She  had no nausea and vomiting, no shortness of breath.  She took two sublingual  nitroglycerin with no change in her pain.  She walked around to try to  relieve it, this did not help.  Of note, this did not worsen her pain  either.  She eventually called her son who came over and then he eventually  called EMS.  EMS gave her three sublingual nitroglycerin and also four baby  aspirin.  She then was brought to the emergency room.  In the ER she was  noted to have still 8-10/10 chest pain.  She was started on nitroglycerin  drip which quickly improved her pain.  We were then called for further  evaluation and management.  Patient's EKG did not show any acute changes.  On my interview patient reports her pain has essentially resolved.  She has  approximately a 0.5/10 chest pain currently.  She describes it as a  tightness currently; the sharp nature of it has relieved completely.  She  reports no nausea, vomiting, no shortness of breath, no radiation.  She says  that this pain is different from her prior MI pain which prompted her  catheterization and CABG in  January of 2005.  That pain was described as  left arm numbness and pressure.  Lastly, patient reports that she has had  the stomach virus for the past three days.  She has had nausea, vomiting,  and diarrhea which is watery stools.  She has had no hematemesis or  hematochezia.  She reports her last emesis was on Wednesday of this week and  she has had two bowel movements today which are watery.  She does report  that she has felt better and began eating regular food today which she  thought had contributed to the onset of this pain.  She originally felt that  she had reflux disease.   PAST MEDICAL HISTORY:  1.  CAD status post CABG January 2005 with a LIMA to the LAD, vein graft to      diagonal, vein graft to OM1 and OM2, and a vein graft to the PDA.  She  had recurrent chest pain in April of 2005 which prompted a cardiac      catheterization.  Her left main was noted to be okay.  Her LAD had 60%      proximal and 40-50% mid.  Her circumflex had diffuse 70%.  Her OM1 and      OM2 were 100%.  Her RCA had 70-80% mid stenosis.  She had a stent      following the stenosis and then had a subtotal occlusion following the      stent.  Her vein graft to the D1 was patent.  Vein graft to OM1 and OM2      were patent.  Vein graft to the PDA was patent.  Her LIMA was not      injected as she was noted to have a separate ostium previously.  2.  Hypertension.  3.  Hyperlipidemia.  4.  Ongoing tobacco abuse with a 30-pack-year history.  5.  Peripheral vascular disease with left internal carotid artery 60%      stenosis in 2004.  6.  COPD.  7.  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  8.  Macular degeneration.  9.  Left hip replacement.  10. Ascending aortic arch aneurysm measuring 3.8 cm.   SOCIAL HISTORY:  She lives in [place], [place].  She is retired from  [employer].  She has a 30-pack-year history.  She is ongoing with half pack per  day.  She denies any alcohol.  No drugs.   She tries to follow a relatively  low cholesterol diet.   FAMILY HISTORY:  Mom died at the age of 45 from coronary disease.  Father  died at 23 from cancer.  One brother had coronary disease and a CABG.   MEDICATIONS:  1.  Aspirin 81 mg p.o. daily.  2.  Zetia 10 mg p.o. daily.  3.  Multivitamin one tablet daily.  4.  Ocuvite one tablet p.o. daily.  5.  Calcium D.  6.  Lipitor 20 mg p.o. daily.  7.  Metoprolol 100 mg p.o. b.i.d.  8.  Lisinopril 20 mg p.o. daily.  9.  Albuterol p.r.n.  10. Nitroglycerin sublingual p.r.n.   ALLERGIES:  CODEINE, PENICILLIN, SULFA, ZANTAC which cause nausea, rash, and  hives.   REVIEW OF SYSTEMS:  She denies any fevers.  She did have chills with her  diarrhea.  She denies any headache or visual changes.  CARDIOPULMONARY:  She  reports chest pain as stated in the HPI.  No worsening of her shortness of  breath.  She reports mild dyspnea on exertion.  No orthopnea nor PND.  No  lower extremity edema.  No palpitations.  No syncope.  She has coughing  occasionally, but not worse recently.  She denies any wheezing.  No urinary  symptoms.  She reports the nausea, vomiting, and diarrhea as reported in the  HPI.  She denies any bright red blood per rectum.  No melena.  No  hematemesis.  She does report GERD symptoms.  All other systems are  negative.   PHYSICAL EXAMINATION:  VITAL SIGNS:  Temperature 97.7, pulse 83,  respirations 20, blood pressure 176/81, saturations 100% on 2 L nasal  cannula.  GENERAL:  She is a pleasant, obese female lying in bed in no acute distress.  HEENT:  Neck:  JVP is approximately 7-8 cm.  She has a soft left carotid  bruit.  She has 2+ carotid upstroke.  CARDIOVASCULAR:  Normal  S1/S2.  Regular rate and rhythm.  Her PMI was  nondisplaced.  Her pulses are 2+ throughout.  She has a bilateral femoral  bruit right greater than left.  LUNGS:  Distant breath sounds.  There is no active wheezing. ABDOMEN:  Soft.  Positive bowel sounds  throughout.  No organomegaly.  She  has mild epigastric tenderness, but no guarding, no rebound.  EXTREMITIES:  She has no edema.  She has 1+ distal pulses.  NEUROLOGIC:  She is alert and oriented.  Nonfocal.   Chest x-ray shows mediastinum widening, COPD changes.  CTA was done which  showed no evidence of dissection.  She had a 3.8 cm ascending aortic  aneurysm which was stable compared to other studies.  EKG showed rate of 87,  sinus rhythm, Q-waves inferiorly, inferior slight ST depression in lead 2,  otherwise unremarkable.   LABORATORY DATA:  Hematocrit 37.  Potassium 3.1, creatinine 0.8.  Point of  care cardiac enzymes:  Troponin less than 0.5 on two draws.  Coags are  normal.   ASSESSMENT/PLAN:  Patient is a 77 year old female with known coronary  disease status post CABG in January 2005 now admitted with chest pain.  1.  Coronary artery disease.  Patient's chest pain has atypical and typical      features.  I am concerned about possibility of unstable angina.  Will      admit her on telemetry, cycle her cardiac enzymes, and follow EKG.  Will      continue her aspirin, beta blocker, Statin, nitrates, ACE inhibitor, and      heparin for now.  Will titrate her medications as tolerated.  __________      cardiac enzymes will likely repeat cardiac catheterization to assess      stability of her grafts.  2.  Tobacco abuse is ongoing.  I discussed cessation and will place a      smoking cessation consult.  3.  Hyperlipidemia.  Will check her lipids.  Will continue her on Zetia and      Lipitor for now.  Will check her LFTs.  4.  Ascending aortic arch aneurysm.  Stable per CT in the ER.  She will need      this monitored.  We will try to optimize blood pressure control with      titration of her medications during this hospitalization.  5.  Hypertension.  Will titrate her medications as needed above.  6.  Diarrhea.  Patient has symptoms concerning for GI virus, possibly       Norovirus.  She reports her symptoms are resolving.  Currently, we will      continue to monitor her.  We will follow strict handwashing protocols.  7.  Hypokalemia likely related to her diarrhea.  Will supplement her      potassium and monitor this.           ______________________________  Lorain Childes, M.D. [facility]     CGF/MEDQ  D:  06/15/2005  T:  06/15/2005  Job:  [id number]

## 2010-09-12 NOTE — H&P (Signed)
NAME:  [name], [name]                         ACCOUNT NO.:  [id number]   MEDICAL RECORD NO.:  [id number]                   PATIENT TYPE:  EMS   LOCATION:  MAJO                                 FACILITY:  [facility]   PHYSICIAN:  Creta Levin, M.D. [facility]      DATE OF BIRTH:  October 27, 1929   DATE OF ADMISSION:  12/25/2002  DATE OF DISCHARGE:                                HISTORY & PHYSICAL   PRIMARY CARE PHYSICIAN:  Patient's PCP is Dr. Windle Guard.   CHIEF COMPLAINT:  Chest pain.   HISTORY OF PRESENT ILLNESS:  [name] is a 77 year old woman with a  history of hypertension, hyperlipidemia, and ongoing tobacco abuse, who  complains of 15/10 substernal chest pain that radiated to her left upper  extremity, was associated with diaphoresis and nausea, and was relieved with  nitroglycerin.  She states that she has had mild, intermittent symptoms for  the past week, but that approximately 10 p.m. on December 24, 2002, she  developed these severe symptoms at rest.  She states that the pain was  severe for at least 30 minutes and that it ultimately began to improve.  She  called her daughter, who drove her to the emergency department.   She has chronic shortness of breath due to COPD, but has not had any  worsening in her shortness of breath or dyspnea on exertion.  She has not  had orthopnea or PND.  She does state that she has some left lower extremity  edema.  She has had no palpitations, presyncope, or syncope.   PAST MEDICAL HISTORY:  1. Hypertension.  2. Hyperlipidemia.  3. COPD.  4. GERD.  5. Left total hip replacement in 1995.   ALLERGIES:  She states PENICILLIN makes her nose itch.   MEDICATIONS:  1. Nexium.  2. Hydrochlorothiazide 25 mg daily.  3. Lisinopril 40 mg daily.  4. Atenolol 100 mg daily.  5. Combivent.   SOCIAL HISTORY:  She lives in [place], [place] by herself.  She is  retired from [employer].  She has a 50-pack-year smoking history and currently  smokes 1 pack per day.  She denies alcohol use.   FAMILY HISTORY:  Her mother had heart problems and died at age 41.  Her  father died of cancer at age 72.  She does have a brother with CABG in his  84s.   REVIEW OF SYSTEMS:  Review of systems positive for some sweats/diaphoresis,  chest discomfort, and mild left lower extremity edema, as well as  gastroesophageal reflux, which is a frequent problem.  The rest of her 10-  point review of systems was negative.   PHYSICAL EXAMINATION:  VITAL SIGNS:  She was afebrile, her blood pressure  157/71, heart rate 61 and regular, respiratory rate was normal, SAO2 was 99%  on 2 L.  GENERAL:  In general, she was in no acute distress but looked a bit  hardened.  HEENT:  Exam revealed some poor dentition but was otherwise unremarkable,  mucous membranes were moist.  NECK:  Her neck revealed no thyromegaly, JVP of approximately 8 cm, no  bruits.  LUNGS:  Her lungs revealed some mild left basilar crackles and scattered  tubular breath sounds.  CARDIOVASCULAR:  Her cardiovascular exam revealed a regular rate and rhythm  with a normal S1, normal S2, no gallop, no murmur.  ABDOMEN:  Her abdominal exam was soft, nondistended, and nontender with  normal bowel sounds.  RECTAL:  Her rectal revealed heme-negative brown stool.  EXTREMITIES:  She had trace edema, 1+ femoral pulses without bruits, 1+  dorsalis pedis pulses.  NEUROLOGIC:  Her neurologic exam was nonfocal.   LABORATORY AND ACCESSORY CLINICAL DATA:  Chest x-ray revealed asymmetric  left pleural thickening and small nodular densities.   Her ECG revealed normal sinus rhythm with borderline LVH but no evidence of  ischemia.   Labs:  The white count was 10.1, hematocrit 46, platelets 241,000.  Sodium  130, potassium 3.1, chloride 93, BUN 12, creatinine 1.1, glucose 133.  The  CK-MB component was 10.2, troponin I was 0.41.  PTT was 29, PT was 12.4, INR  was 0.9.   ASSESSMENT AND PLAN:  1.  Non-ST-elevation myocardial infarction.  The patient was started on     heparin in the emergency department and I have initiated Integrilin as     well.  She was given an aspirin, which should be continued daily.  I have     started her on metoprolol, changed her ACE inhibitor to Captopril for     titration, and continued hydrochlorothiazide.  She was not on a statin,     so I have started her on Lipitor 80 mg.  This dose can be decreased,     depending on her morning lipid panel.  We will continue cycling her     cardiac markers, to follow the trend, but she will certainly need cardiac     catheterization to identify her need for revascularization.  2. Smoking.  I have begun to discuss smoking cessation with [name].     She recognizes that she needs to quit and has a daughter who will help     her with this process.  3. Abnormal chest x-ray.  We will need to arrange for a CT of the chest to     further evaluate this pleural thickening and these nodular densities.  I     have already begun to emphasize the need for her quitting smoking, not     only for coronary disease prevention but also for prevention of lung     cancer.  4. Hypokalemia.  I have written to give her 40 mEq of oral potassium.  She     may need to be on longstanding potassium if she continues to need     hydrochlorothiazide.                                                Creta Levin, M.D. [facility]    RPK/MEDQ  D:  12/25/2002  T:  12/25/2002  Job:  [id number]   cc:   Windle Guard, M.D.  [address]  [place], [place] [address]  Fax: [phone]

## 2010-09-12 NOTE — H&P (Signed)
NAME:  [name], [name]                         ACCOUNT NO.:  [id number]   MEDICAL RECORD NO.:  [id number]                   PATIENT TYPE:  INP   LOCATION:  2021                                 FACILITY:  [facility]   PHYSICIAN:  Salvadore Farber, M.D.             DATE OF BIRTH:  23-Jun-1929   DATE OF ADMISSION:  05/18/2003  DATE OF DISCHARGE:                                HISTORY & PHYSICAL   PRIMARY CARE PHYSICIAN:  Dr. Windle Guard.   PRIMARY CARDIOLOGIST:  Dr. Salvadore Farber.   CHIEF COMPLAINT:  Chest pain.   HISTORY OF PRESENT ILLNESS:  Ms. [name] is a 77 year old lady with  hypertension, dyslipidemia, ongoing tobacco use, who is status post inferior  myocardial infarction in August 2004.  Catheterization at that time  demonstrated a 99% stenosis of the mid RCA, which was stented using a 3.0 x  13.0-mm Hepacoat.  Ejection fraction was 70%.  She was also found to have a  70% ulcerated stenosis of the proximal LAD, an 80% stenosis of the mid OM.  Her hospital course was complicated by GI bleeding that was felt likely to  be due to cecal AVMs.  An EGD showed a normal esophagus, stomach and  duodenum.   Since then, she has generally done well.  An adenosine Cardiolite performed  February 19, 2003  demonstrated borderline decrease in uptake in the base of  the inferior wall with modest reversibility.  EF was 66%.  There was no  evidence of ischemia in the anterior or lateral territories.  Today, she is  seen in the office after scheduling an appointment due to recent recurrence  of chest pain.  She describes 10 days of substernal chest discomfort  radiating to her left arm and associated with mild dyspnea.  This has  occurred every evening for the past 10 nights with onset at rest.  She has  taken 2 sublingual nitroglycerin with relief with each episode.  She has had  the same symptom with walking in the cold over the past 2 days.  She is  currently asymptomatic.  She states that  the symptoms are identical to those  with which she presented with her myocardial infarction.   PAST MEDICAL HISTORY:  1. GI bleed attributed to cecal AVMs.  2. Hypertension.  3. Dyslipidemia.  4. COPD with ongoing tobacco abuse.  5. GERD.  6. Status post left total hip replacement in 1995.  7. Status post vaginal hysterectomy.  8. Status post hemorrhoidectomy.  9. Migraines.  10.      Macular degeneration.  11.      Coronary disease, as above.   ALLERGIES:  PENICILLIN, ZANTAC, CODEINE.   CURRENT MEDICATIONS:  1. Vitamin E at 400 IU every other day.  2. Lisinopril 40 mg per day.  3. Atenolol 100 mg per day.  4. Mucinex 600 mg per day.  5. Multivitamin/Ocuvite.  6. Calcium plus  D.  7. Aspirin 81 mg per day.  8. Zetia 10 mg per day.  9. Lipitor 80 mg per day.  10.      Fosamax 70 mg q.wk.  11.      Combivent inhaler 2 puffs b.i.d.   SOCIAL HISTORY:  The patient lives in [place], [place] by herself.  She is retired from [employer].  She has a greater than 50-pack-year history of  smoking and currently smokes 1 pack per day.  Denies alcohol use.   FAMILY HISTORY:  Mother had heart problems and died at age 51.  Father  died of cancer at 29.  Brother had a CABG in his 88s.   REVIEW OF SYSTEMS:  Mild GERD.  No recent hematochezia.  Negative in detail  except as above.   PHYSICAL EXAMINATION:  GENERAL/VITAL SIGNS:  On physical exam, she is a  generally well-appearing woman in no distress with heart rate 67, blood  pressure 150/80 and weight of 181 pounds.  NECK:  She has no jugular venous distention.  LUNGS:  Lungs are clear to auscultation.  CARDIAC:  She has a nondisplaced point of maximal cardiac impulse.  There is  a regular rate and rhythm with normal S1 and S2.  There is no S3 or murmur.  There is a soft S4.  ABDOMEN:  The abdomen is soft, nondistended and nontender.  There is no  hepatosplenomegaly and no mass.  Bowel sounds are normal.  No abdominal  bruit.   PERIPHERAL VASCULAR:  Femoral pulses 2+ bilaterally.  Carotid pulses 2+  bilaterally with a left carotid bruit.   ACCESSORY CLINICAL DATA:  Electrocardiogram demonstrates normal sinus rhythm  with PVC.  There are approximately 1-mm ST depressions in II and aVF which  are new compared with January 08, 2003.   IMPRESSION:  1. Unstable angina.  2. Hypertension.  3. Dyslipidemia.  4. Chronic obstructive pulmonary disease.  5. History of gastrointestinal bleed attributable to arteriovenous     malformations.   PLAN:  1. Will admit to hospital.  She will be treated with low-molecular-weight     heparin as well as continuation of her     aspirin, Plavix, beta blocker, ACE inhibitor and statin.  We will plan     cardiac catheterization on Monday.  2. Check fasting lipid profile.  3. Again advised smoking cessation, with smoking team to see her.                                                Salvadore Farber, M.D.    WED/MEDQ  D:  05/18/2003  T:  05/19/2003  Job:  [id number]   cc:   Windle Guard, M.D.  [address]  [place], [place] [address]  Fax: [phone]

## 2010-09-12 NOTE — H&P (Signed)
NAME:  [name], [name] NO.:  [id number]   MEDICAL RECORD NO.:  [id number]          PATIENT TYPE:  INP   LOCATION:  1823                         FACILITY:  [facility]   PHYSICIAN:  Rollene Rotunda, M.D.   DATE OF BIRTH:  03-Sep-1929   DATE OF ADMISSION:  04/07/2004  DATE OF DISCHARGE:                                HISTORY & PHYSICAL   PHYSICIANS:  Primary care physician:  Windle Guard, M.D.  Primary cardiologist:  Salvadore Farber, M.D.   CHIEF COMPLAINT:  Chest pain.   HISTORY OF PRESENT ILLNESS:  [name] is a 77 year old female with a  history of coronary artery disease.  Starting two days ago, she began having  constant nausea as well as dizziness which worsened with sudden movements.  Yesterday she began also complaining of left arm aching and chest heaviness.  She saw Dr. Jeannetta Nap today, and he did an EKG.  Her blood pressure went well  above 200 by her report, and he sent her to the emergency room.   [name] currently complains of some left arm aching and dizziness but  no chest pressure or nausea.  She states that the chest heaviness and left  arm symptoms are similar to her pre-bypass symptoms.  She does not routinely  have these with activities of daily living, although she does not exercise  regularly.   PAST MEDICAL HISTORY:  1.  Aortocoronary bypass surgery in January 2005 with LIMA to LAD, SVG to      diagonal, SVG to OM-1 and OM-2, and SVG to PDA.  2.  Status post cardiac catheterization in April 2005 showing patent      saphenous vein grafts but unable to visualize the LIMA (status post      chest CT with a separate ostium of the left subclavian, free of      significant disease).  3.  Status post MI with two stents to the RCA in September 2004.  4.  Hypertension.  5.  Hyperlipidemia.  6.  Family history of coronary artery disease.  7.  Ongoing tobacco use.  8.  History of peripheral vascular disease with a left ICA 60% in 2004.  9.   History of lower GI bleed in August 2004 secondary to AVMs in the cecum.  10. History of COPD.  11. Macular degeneration.   PAST SURGICAL HISTORY:  1.  Cardiac catheterization.  2.  CABG.  3.  Colonoscopy.  4.  EGD.  5.  Polypectomy.  6.  Hemorrhoidectomy.  7.  Hysterectomy.  8.  Left total hip replacement.   ALLERGIES:  CODEINE, PENICILLIN, SULFA, and ZANTAC.   CURRENT MEDICATIONS:  1.  Zetia 10 mg per day.  2.  Aspirin 81 mg 2 daily.  3.  Multivitamins daily.  4.  Ocuvite daily.  5.  Calcium plus D daily.  6.  Vitamin E 400 international units daily.  7.  Lipitor 80 mg daily.  8.  Metoprolol 50 mg b.i.d.  9.  Altace 10 mg daily.  10. Albuterol MDI b.i.d.   SOCIAL HISTORY:  She lives in [place]  alone, but one remaining son lives very  nearby.  She is retired from [employer].  She has a greater than 50-pack-year  history of tobacco but does not abuse alcohol or drugs.   FAMILY HISTORY:  Her mother died at age 19 with a history of heart disease.  Her father died of cancer at age 3 with no history of heart disease.  She  has a brother who is status post bypass surgery and is alive and well.   REVIEW OF SYSTEMS:  Significant for occasional wheezing and increased cough  recently.  She feels like she has had some sinus drainage recently.  She has  significant arthralgias.  She has occasional reflux symptoms.  She takes  Nexium and Tums p.r.n. for these.  Review of Systems is otherwise negative.   PHYSICAL EXAMINATION:  VITAL SIGNS:  Temperature 97.6, blood pressure  167/78, pulse 81, respiratory rate 18, O2 saturation 98% on room air.  GENERAL:  She is a well-developed, elderly white female in no acute  distress.  HEENT:  Head is normocephalic and atraumatic with pupils equal, round, and  reactive to light and accommodation.  Extraocular movements are intact.  Sclerae are clear.  NECK:  Supple.  No lymphadenopathy, thyromegaly, bruit, or JVD is noted.  CARDIOVASCULAR:  Regular  rate and rhythm with clear S1 and S2.  Distal  pulses are 2+, and no femoral bruits are appreciated.  LUNGS:  She has decreased breath sounds in the bilateral bases with a few  scattered wheezes.  SKIN:  No rashes or lesions are noted.  ABDOMEN:  Soft and nontender with active bowel sounds.  EXTREMITIES:  She has no edema and no cyanosis or clubbing.  MUSCULOSKELETAL:  There are no joint deformities or effusions.  NEUROLOGIC:  She is alert and oriented.  Cranial nerves II-XII grossly  intact.   LABORATORY DATA AND OTHER STUDIES:  Chest x-ray:  COPD but no acute disease.   EKG:  Sinus rhythm, rate 82, with no acute ischemic changes.   Laboratory values are pending except point-of-care markers are negative x 1.   IMPRESSION AND PLAN:  Chest pressure/left arm pain:  There is no objective  evidence of ischemia at this time.  Her symptoms are similar to her previous  symptoms for which in January 2005 she had bypass, and in April 2005 she had  a catheterization and a Cardiolite with no obvious ischemia.  She will be  admitted to rule out myocardial infarction. We will add IV nitroglycerin and  heparin overnight.  Her other medications will be continued.  If her cardiac  enzymes are positive, catheterization is indicated.  If her cardiac enzymes  are negative, Dr. Samule Ohm is to evaluate the patient and advise if any  further workup is indicated.   This is Theodore Demark, P.A. dictating for Rollene Rotunda, M.D. who saw the  patient and determined the plan of care.       RB/MEDQ  D:  04/07/2004  T:  04/07/2004  Job:  [id number]   cc:   Windle Guard, M.D.  [address]  [place], [place] [address]  Fax: [phone]   Salvadore Farber, M.D. [facility]  [address]  [address]  [place]  [place] [address]

## 2010-09-12 NOTE — Cardiovascular Report (Signed)
NAME:  FANTA, WIMBERLEY NO.:  [id number]   MEDICAL RECORD NO.:  [id number]                   PATIENT TYPE:  INP   LOCATION:                                       FACILITY:  [facility]   PHYSICIAN:  Charlies Constable, M.D.                  DATE OF BIRTH:  06/19/1929   DATE OF PROCEDURE:  05/20/2002  DATE OF DISCHARGE:                              CARDIAC CATHETERIZATION   CLINICAL HISTORY:  Mr. Fleeger is 77 years old and last August had a non-ST  segment elevation myocardial infarction and subsequently had stenting of the  mid and distal right coronary arteries.  She developed recurrent symptoms  and was readmitted with unstable angina.   PROCEDURE:  The procedure was performed via the right femoral artery using  arterial sheath and 6-French preformed coronary catheters.  A front wall  arterial puncture was performed and Omnipaque contrast was used.  Distal  aortogram was performed to rule out abdominal aortic aneurysm.   RESULTS:  1. LEFT MAIN CORONARY ARTERY:  The left main coronary artery had a 60-70%     ostial stenosis.  2. LEFT ANTERIOR DESCENDING ARTERY:  The left anterior descending artery was     heavily calcified.  There was 70-80% stenosis in its proximal portion.     The LAD gave rise to a small and then larger diagonal branch and three     septal perforators.  A sub-branch of the larger diagonal branch had a 70%     stenosis.  3. CIRCUMFLEX ARTERY:  The circumflex artery gave rise to a marginal branch     and an AV branch which terminated in a posterolateral branch.  There was     80% narrowing in the mid circumflex after the marginal branch.  There was     90% stenosis in the marginal branch.  4. RIGHT CORONARY ARTERY:  The right coronary was a dominant vessel that     gave rise to a right ventricular branch, posterior descending branch, and     three posterolateral branches.  There was 50% narrowing within the stent     in the mid right  coronary artery.  There was 30% narrowing in the mid and     distal vessel.  There was 95% stenosis in the distal right coronary     artery.  Non drug-eluting stents have been used in these areas because of     her previous history of GI bleeding.   LEFT VENTRICULOGRAM:  The left ventriculogram performed in the RAO  projection showed good wall motion with no areas of hypokinesis.  The  estimated ejection fraction was 50-60%.   A distal aortogram was performed which showed patent renal arteries and no  significant aorto-iliac obstruction.  There was a distal abdominal aortic  aneurysm that appeared to be small.   The aortic pressure was 188/74 with a  mean of 113.  The left ventricular  pressure was 188/24.   CONCLUSION:  1. Severe left main and three vessel coronary artery disease with 60-70%     narrowing in the left main coronary artery, 70-80% narrowing in the     proximal LAD, 80% narrowing in the mid circumflex artery with 90%     narrowing in the marginal branch, 50% narrowing within the stent in the     mid right coronary artery, and 95% narrowing within the stent of the     distal right coronary artery and normal LV function.  2. Distal abdominal aortic aneurysm.   RECOMMENDATIONS:  I think bypass surgery is the patient's best therapeutic  option.  Surgical consultation was obtained.                                               Charlies Constable, M.D.    BB/MEDQ  D:  05/29/2003  T:  05/30/2003  Job:  [id number]

## 2010-09-12 NOTE — Discharge Summary (Signed)
NAME:  [name], [name]               ACCOUNT NO.:  [id number]   MEDICAL RECORD NO.:  [id number]          PATIENT TYPE:  INP   LOCATION:  1534                         FACILITY:  [facility]   PHYSICIAN:  Timothy E. Earlene Plater, M.D. DATE OF BIRTH:  01/15/1930   DATE OF ADMISSION:  02/04/2007  DATE OF DISCHARGE:  02/09/2007                               DISCHARGE SUMMARY   FINAL DIAGNOSES:  1. Cholecystolithiasis, choledocholithiasis.  2. Postoperative hyponatremia.  3. Hypokalemia.  4. Weakness.  5. Decondition status.   [name] was operated on as a routine cholecystectomy on  February 04, 2007.  She had been cleared by [facility], Dr.  Myrtis Ser, and her laboratory data were normal.  During the procedure, the  operative cholangiogram was abnormal showing common duct stones.  She  was therefore admitted for extended stay.  She was seen by [facility] on February 04, 2007, the procedure scheduled for  February 05, 2007.  She was satisfactory postop at this point.  An  attempt was made to do the ERCP.  They ran into problem with hypoxia  that was corrected without complications later with anesthesia service  on standby and ERCP was completed and several stones were removed from  the bile ducts.   This was followed by increased incisional pain, slow to move, and slow  to progress.  She was ambulatory and tolerating a diet.  Her potassium  and sodium went down a bit.  She was known to have coronary artery  disease, chronic obstructive pulmonary disease, peripheral vascular  disease, and hypertension.  She was back on her medications and finally  progressed to the point being ready for discharge with the family on  February 09, 2007.  There were no other complications or problems during  her hospitalization and she will be seen and followed as an outpatient.      Timothy E. Earlene Plater, M.D.  Electronically Signed     TED/MEDQ  D:  03/15/2007  T:  03/16/2007  Job:   [id number]   cc:   Barbette Hair. Arlyce Dice, MD,FACG  [address]  [place]  [place] [address]   Windle Guard, M.D.  Fax: [phone]

## 2010-09-12 NOTE — Discharge Summary (Signed)
NAME:  [name], [name]                         ACCOUNT NO.:  [id number]   MEDICAL RECORD NO.:  [id number]                   PATIENT TYPE:  INP   LOCATION:  2012                                 FACILITY:  [facility]   PHYSICIAN:  Gwenith Daily. Tyrone Sage, M.D.            DATE OF BIRTH:  1929/06/01   DATE OF ADMISSION:  05/18/2003  DATE OF DISCHARGE:  05/31/2003                                 DISCHARGE SUMMARY   PRIMARY ADMITTING DIAGNOSIS:  Chest pain.   ADDITIONAL/DISCHARGE DIAGNOSES:  1. Severe three-vessel coronary artery disease with left main disease.  2. Unstable angina.  3. Restenosis of right coronary stents.  4. History of myocardial infarction in August of 2004.  5. Hypertension.  6. Dyslipidemia.  7. Chronic obstructive pulmonary disease.  8. Gastrointestinal reflux disease.  9. Macular degeneration.  10.      History of gastrointestinal bleed attributed to cecal arteriovenous     malformations.  11.      History of migraines.  12.      Ongoing tobacco abuse.  13.      Diverticulosis.  14.      Intestinal angiodysplasia.  15.      Postoperative anemia.   PROCEDURES PERFORMED:  1. Cardiac catheterization.  2. Colonoscopy.  3. Coronary artery bypass grafting x 5 (left internal mammary artery to the     LAD, saphenous vein graft to the diagonal, saphenous vein graft to the     posterior descending artery, sequential saphenous vein graft to the first     obtuse marginal and the distal circumflex).  4. Endoscopic vein harvest from right lower extremity.   HISTORY:  The patient is a 77 year old female with a history of coronary  artery disease, who is status post myocardial infarction in August of 2004.  At that time, she underwent cardiac catheterization and was found to have a  99% stenosis of the mid right coronary artery and stent was placed.  She was  also found to have a 70% ulcerated stenosis of the proximal LAD and an 80%  stenosis of the mid OM.  That hospitalization  was complicated by GI bleeding  that was felt to be due to cecal arteriovenous malformations.  She underwent  an EGD during that admission as well which showed normal esophagus, stomach,  and duodenum.  She had done managed medically since that time, but on the  day of this admission was seen in the [facility] office for  recurrence of chest pain.  She states that about 10 days prior to this  admission, she began to develop substernal chest discomfort which radiated  to her left arm and was associated with mild dyspnea.  She has taken  sublingual nitroglycerin with relief.  Dr. Samule Ohm evaluated the patient and  it was felt that she should be admitted to the hospital for catheterization.   HOSPITAL COURSE:  She was admitted to [facility] on May 18, 2003.  She was started on aspirin, Plavix, and Lovenox and was continued on  beta blocker, ACE inhibitor, and statin.  She underwent cardiac  catheterization on May 21, 2003, which showed severe three-vessel  coronary artery disease, which included a 60-70% stenosis of the left main,  as well as 95% stenosis of the distal right coronary artery end stent.  Because of her anatomy, she was felt to be a poor candidate for further  percutaneous intervention.  Therefore, a cardiothoracic consultation was  obtained.  Dr. Tyrone Sage saw the patient and felt that she should proceed  with coronary artery bypass grafting.  After a complete discussion of the  risks, benefits, and alternatives with the patient, she consented to  proceed.  After preoperative workup, she was taken to the operating room on  May 22, 2003, and underwent CABG x 5 as described in detail above.  She  tolerated the procedure well and was transferred to the SICU in stable  condition.  She was extubated shortly after surgery.  She was  hemodynamically stable on postoperative day #1, but was still maintained on  low-dose dopamine for blood pressure management.   She was also having a  moderate of chest tube drainage.  She was kept in the ICU for further  observation.  She required a transfusion of packed red blood cells for a  hemoglobin of 8.0 and a hematocrit of 27.5.  She was able to be mobilized by  cardiac rehabilitation phase I.  She was weaned from the dopamine drip and  this was discontinued.  By postoperative day #2, she was doing well.  Her  hemoglobin and hematocrit were stable at 8.2 and 24, respectively, and her  blood pressure had improved with systolics in the 100-110 range.  She was  able to be transferred to the floor.  She was treated with aggressive  pulmonary toilet measures for her history of COPD and tobacco abuse.  She  was also quite volume overloaded and was started on Lasix and potassium.  She was seen by the smoking cessation coordinator and was encouraged to quit  smoking.  According to the patient, she is going to remain off of tobacco  following discharge.  On postoperative day #4, she had another drop in her  hemoglobin and hematocrit to 7.6 and 22.7, respectively.  There was no  evidence of GI blood loss and all of her stools were Hemoccult negative.  She did receive an additional unit of packed red blood cells.  Her  hemoglobin and hematocrit improved to 9.1 and 27.3, respectively, following  transfusion.  She has overall done well this admission.  Her main  postoperative difficulty has been her pulmonary status.  She has been  somewhat slow to wean from supplemental oxygen.  She has been diuresed  aggressively and has been treated incentive spirometry and restarted on her  home Combivent inhaler dose.  At the present, she is maintaining O2  saturations of 95% or greater on 1 L of supplement oxygen.  It is  anticipated that over the next 24 hours she should be able to be weaned  completely from supplement oxygen.  She is otherwise doing well.  Her laboratories on May 30, 2003, showed a hemoglobin of 9.4,  hematocrit 28,  platelets 363, and white count 10.9.  She has been started on iron  supplement.  Further laboratories show a potassium of 3.7 which has also  been supplemented, a BUN of  11, and a creatinine of 0.9.  It is felt that if  she continues to remain stable over the next 24 hours that hopefully she  will be ready for discharge home on May 31, 2003.   DISCHARGE MEDICATIONS:  1. Enteric-coated aspirin 325 mg daily.  2. Lopressor 25 mg b.i.d.  3. Lasix 40 mg daily x 1 week.  4. K-Dur 20 mEq daily x 1 week.  5. Nu-Iron 150 mg b.i.d.  6. Folic acid 1 mg daily.  7. Zetia 10 mg daily.  8. Fosamax 70 mg weekly.  9. Combivent metered dose inhaler two puffs b.i.d.  10.      Lipitor 80 mg daily.  11.      Calcium plus vitamin D one daily.  12.      Multivitamins daily.  13.      Ultram 50-100 mg q.4-6h. p.r.n. for pain.   ACTIVITY:  She is to refrain from driving, heavy lifting, or strenuous  activity.  She is encouraged to continue daily walking and use of her  incentive spirometer.  She is also encouraged not to smoke and was given the  number of the smoking cessation coordinator in case she was problems  following discharge.   DIET:  She will continue a low-fat, low-sodium diet.   WOUND CARE:  She will shower daily and clean her incisions with soap and  water.   DISCHARGE FOLLOWUP:  She will see Dr. Samule Ohm in the office on June 11, 2003, at 3:30 p.m.  A chest x-ray will be done at this visit.  She will then  follow up with Dr. Tyrone Sage in three weeks and our office will call to  arrange this appointment.  She is asked to bring her chest x-ray for Dr.  Tyrone Sage to review.  In the interim, home health nursing has been arranged  for cardiac surgery protocol and to follow the patient closely.  She is also  to call immediately if she experiences any problems or has questions.      Coral Ceo, P.A.                        Gwenith Daily Tyrone Sage, M.D.    GC/MEDQ  D:   05/30/2003  T:  05/31/2003  Job:  [id number]   cc:   Salvadore Farber, M.D.  [address]  [address]  [place]  [place] [address]   Windle Guard, M.D.  [address]  [place], [place] [address]  Fax: [phone]

## 2010-09-12 NOTE — Discharge Summary (Signed)
[name], [name]               ACCOUNT NO.:  [id number]   MEDICAL RECORD NO.:  [id number]          PATIENT TYPE:  INP   LOCATION:  6523                         FACILITY:  [facility]   PHYSICIAN:  Rollene Rotunda, M.D.   DATE OF BIRTH:  04/30/1929   DATE OF ADMISSION:  04/07/2004  DATE OF DISCHARGE:  04/08/2004                                 DISCHARGE SUMMARY   PROCEDURES:  None.   HOSPITAL COURSE:  Ms. [name] is a 77 year old female with a history of  coronary artery disease.  She had bypass surgery in January of 2005 and was  admitted for chest pain in April of 2005, at which time she had patent vein  grafts, but they were unable to visualize the LIMA.  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   She describes a 2-day history of dizziness and nausea.  She also had some  left arm pain and chest heaviness.  She saw her primary care physician and  was sent to the emergency room.   [name] was admitted for further evaluation and treatment.  Her  cardiac enzymes were negative for MI.  Dr. Salvadore Farber evaluated her  and felt that she could be further evaluated as an outpatient.  Her  orthostatic vital signs were checked and were negative.  She is to follow up  with Dr. Windle Guard for vertigo and she is to continue her home  medications and follow up with Dr. Salvadore Farber.  We will arrange  carotid Dopplers prior to her followup appointment as well.  [name]  was considered stable for discharge on April 08, 2004.   DISCHARGE DIAGNOSES:  1.  Status post aortocoronary bypass surgery in January of 2005 with left      internal mammary artery to left anterior descending, saphenous vein      graft to diagonal, saphenous vein graft to first obtuse marginal and      second obtuse marginal, and saphenous vein graft to posterior descending  artery.  2.  Status post cardiac catheterization in April of 2005 with patent      saphenous vein grafts.  3.  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  4.  History of myocardial infarction with stent x2 to the right coronary      artery in September of 2004.  5.  Hypertension.  6.  Hyperlipidemia.  7.  Family history of heart disease.  8.  Ongoing tobacco use.  9.  History of peripheral vascular disease with the internal carotid artery      stenosis of 60%.  10. History of lower gastrointestinal bleed in August of 2004 secondary to      arteriovenous malformations in the cecum.  11. Chronic obstructive pulmonary disease.  12. Macular degeneration.  13. Allergy or intolerance to codeine, penicillin, sulfa and Zantac.   DISCHARGE INSTRUCTIONS:  1.  Her activity level  is to be as tolerated.  2.  She is to stick to a low-fat diet.  3.  She is to get carotid Dopplers on April 22, 2004 at 9:30 and follow      up with Dr. Salvadore Farber on May 05, 2004 at 12:15.   DISCHARGE MEDICATIONS:  1.  Zetia 10 mg daily.  2.  Aspirin 81 mg 2 tabs daily.  3.  Multivitamin, Ocuvite and calcium as prior to admission.  4.  Lipitor 80 mg daily.  5.  Metoprolol 50 mg b.i.d.  6.  Altace 10 mg daily.  7.  Albuterol MDI as prior to admission.       RB/MEDQ  D:  04/08/2004  T:  04/08/2004  Job:  [id number]   cc:   Windle Guard, M.D.  [address]  [place], [place] [address]  Fax: [phone]   Salvadore Farber, M.D. [facility]  [address]  [address]  [place]  [place] [address]

## 2010-09-12 NOTE — Cardiovascular Report (Signed)
NAME:  [name], [name]                         ACCOUNT NO.:  [id number]   MEDICAL RECORD NO.:  [id number]                   PATIENT TYPE:  INP   LOCATION:  2903                                 FACILITY:  [facility]   PHYSICIAN:  Arturo Morton. Riley Kill, M.D.             DATE OF BIRTH:  1929/12/10   DATE OF PROCEDURE:  12/27/2002  DATE OF DISCHARGE:                              CARDIAC CATHETERIZATION   INDICATIONS:  [name] is a 77 year old who underwent catheterization  earlier today.  This demonstrated a critical lesion of the right coronary  artery.  She also had some moderate circumflex and LAD disease.  However, on  anticoagulants earlier she had developed significant GI bleeding.  She was  seen in consultation by GI and endoscopy was planned for today to try to  identify this lesion better before an attempt at percutaneous intervention  because of the bleeding.  However, the patient developed chest pain and ST  elevation.  The pain was not relieved by three nitroglycerin.  We made the  decision to come to the laboratory.  Our treatment strategy ahead of time  was to give two-thirds to three-quarters of a dose of Angiomax in the  laboratory and stop it immediately after the procedure.  Moreover, we had  elected to use heparin coated stents.  I explained this to the patient and  her son.  We also elected to give the patient a unit of packed cells given  her baseline hemoglobin of 10 now after previously being above 13.  She was  brought emergently to the catheterization laboratory in the early morning  hours.   PROCEDURE:  1. Percutaneous stenting of the distal and mid right coronary arteries.   DESCRIPTION OF PROCEDURE:  The patient was brought to the catheterization  laboratory and prepped and draped in the usual fashion.  Through an anterior  puncture the left femoral artery was easily entered.  The 6-French sheath  was placed.  Views of the right coronary artery were then  obtained.  The  patient had a subtotal occlusion of the right coronary artery distally as  well as a moderate lesion in the mid vessel.  There was also a moderate  stenosis that was noncritical at the junction of the mid and distal vessel.  As a result, we elected to give the patient Angiomax.  Three-quarters of a  dose of Angiomax was given and an ACT was checked and was 258.  The Angiomax  drip was started.  We then quickly proceeded to get into the right coronary  artery and a high-torque BMW wire was then passed distally.  Pre dilatation  was done with a 2 mm balloon.  We then placed a 2.25 x 13 Hepacoat stent in  the distal vessel.  This went relatively well without much difficulty.  We  elected to post dilate this stent and, in fact, with post dilatation the  stent was taken up to about 3 mm.  We then decided to deploy a 3 mm stent in  the mid vessel at the site of about 60-70% narrowing.  This area was  directly stented and post dilated with a 3.5 post dilatation balloon  throughout the course of the stent.  This whole area was markedly improved.  We then were planning to perhaps put the 3.5 balloon down to the distal  stent but we had some difficulty passing it and the patient became  increasingly uncomfortable on the table.  We elected to stop the procedure  at this point in time given the need to discontinue anticoagulation and what  appeared to be good angiographic results.  We also were planning to take a  picture of the left coronary system but with a diagnostic 6-French there was  continued damping in the left main artery so I elected not to proceed with  this at the present time.  All catheters were subsequently removed and the  femoral sheath sewn into place.  The Angiomax was stopped.  The patient did  not have evidence of bleeding either into the right groin or GI bleeding  with the Angiomax and as I said it was stopped promptly after the procedure  and we moved as  expeditiously through the procedure as possible to minimize  the patient's exposure to anticoagulants given her significant  gastrointestinal bleed.  There was marked improvement in the appearance of  the artery and the patient left the laboratory with no chest pain.   ANGIOGRAPHIC DATA:  1. The right coronary artery has some mild ostial narrowing.  There is     probably 30% narrowing near the junction of the proximal and mid vessel.     There is 60-70% narrowing in the mid vessel and this area was     successfully stented.  There was also a distal stenosis that was     subtotally occluded.  Following stenting this was reduced to about 10-20%     narrowing after maximal dilatation using intracoronary nitroglycerin.     The posterior descending and posterolateral systems were free of critical     disease.   CONCLUSION:  1. Successful percutaneous stenting of the distal right coronary artery.  2. Successful percutaneous stenting of the mid right coronary artery.   DISPOSITION:  We elected to use a Hepacoat stent.  The patient is not a  candidate for Plavix at the present time.  We will continue her aspirin  currently.  We elected to use a Hepacoat stent to try to reduce the risk of  subacute thrombosis.  Moreover, the patient has had substantial  gastrointestinal bleed and this will need to be worked up.  We will not give  her any anticoagulants at the present time other than the small amount of  aspirin.  Hopefully, this will stabilize the lesion and further evaluation  for coronary disease will take place at a later date.                                                Arturo Morton. Riley Kill, M.D.    TDS/MEDQ  D:  12/27/2002  T:  12/27/2002  Job:  [id number]   cc:   Salvadore Farber, M.D.   [facility]   Windle Guard, M.D.  [address]  [address]  [place], [place] [address]  Fax: [phone]

## 2010-09-12 NOTE — Op Note (Signed)
   NAME:  [name], [name]                         ACCOUNT NO.:  [id number]   MEDICAL RECORD NO.:  [id number]                   PATIENT TYPE:  INP   LOCATION:  2903                                 FACILITY:  [facility]   PHYSICIAN:  Lina Sar, M.D. [facility]               DATE OF BIRTH:  October 06, 1929   DATE OF PROCEDURE:  12/28/2002  DATE OF DISCHARGE:                                 OPERATIVE REPORT   PROCEDURE:  Upper endoscopy.   INDICATIONS:  This 77 year old white female developed large amount of  hematochezia and dark stool following cardiac catheterization which involved  anticoagulation with Plavix and Integrelin anticoagulants have been  discontinued and the GI bleed has stabilized.  She has known AV malformation  of the cecum.  She has denied upper GI symptoms of abdominal  pain.  Her  stool has continued to be Hemoccult positive.  She is undergoing upper  endoscopy before recommended coronary artery bypass graft.   ENDOSCOPIC ASSESSMENT:  Fujinon single-channel video endoscope.   SEDATION:  Versed 5 mg IV, fentanyl 30 mcg IV.   FINDINGS:  Fujinon single-channel videoscope passed under direct vision into  the posterior pharynx into the esophagus.  The patient was monitored by  pulse oximetry.  Her oxygen saturation was satisfactory.  The patient was  cooperative.  The approximate and distal esophageal mucosa was unremarkable.  The squamocolumnar junction was normal.  There was no hiatal hernia or  stricture.   Stomach:  Stomach was insufflated with air and showed normal appearing  gastric mucosa.  There was no blood or coffee ground material in the  stomach.  Rugal folds were normal.  Gastric antral was unremarkable.  Pelvic  outlet was wide open.  Retroflexion of  endoscope revealed normal fundus and  cardia.   Duodenum:  The duodenum, duodenal bulb and descending duodenum were normal.  Again, there was no blood in the duodenal bulb.  Endoscope was then  retracted and stomach  decompressed.   The patient tolerated the procedure well.   IMPRESSION:  Normal upper endoscopy, esophagus, stomach and duodenum.  No  evidence of gastrointestinal blood loss.   PLAN:  There is no evidence of upper GI lesion.  The patient's blood loss  was most likely lower GI, likely from AV malformation.  She is currently on  aspirin and will be a high risk for rebleeding postoperatively if she stays  on anticoagulants.  She will have to be observed carefully for recurrent  bleeding.                                                Lina Sar, M.D. [facility]    DB/MEDQ  D:  12/28/2002  T:  12/29/2002  Job:  [id number]

## 2010-09-12 NOTE — Op Note (Signed)
NAME:  [name], BAUMBACH                         ACCOUNT NO.:  [id number]   MEDICAL RECORD NO.:  [id number]                   PATIENT TYPE:  INP   LOCATION:  2309                                 FACILITY:  [facility]   PHYSICIAN:  Gwenith Daily. Tyrone Sage, M.D.            DATE OF BIRTH:  [birth year]   DATE OF PROCEDURE:  05/22/2003  DATE OF DISCHARGE:                                 OPERATIVE REPORT   PREOPERATIVE DIAGNOSES:  1. Unstable angina with restenosis of right coronary stents.  2. Three-vessel coronary artery disease with left main disease.   POSTOPERATIVE DIAGNOSES:  1. Unstable angina with restenosis of right coronary stents.  2. Three-vessel coronary artery disease with left main disease.   SURGICAL PROCEDURE:  Coronary artery bypass grafting x5 with the left  internal mammary to the left anterior descending coronary artery, reversed  saphenous vein graft to the diagonal coronary artery, sequential reversed  saphenous vein graft to the first obtuse marginal and distal circumflex,  reversed saphenous vein graft to the posterior descending coronary artery,  with right endovein harvesting.   SURGEON:  Gwenith Daily. Tyrone Sage, M.D.   FIRST ASSISTANT:  Rowe Clack, P.A.-C.   BRIEF HISTORY:  The patient is a 77 year old female who in August 2004  presented with coronary occlusive disease.  After admission she underwent  cardiac catheterization with significant three-vessel disease with worse  disease in the right coronary artery.  Two stents were placed in the right  coronary artery as an emergency intervention two days after admission.  Heparin-coated stents were used.  The patient initially had suffered an  inferior myocardial infarction with that admission and intervention.  Since  she had done well, however.  For the past 10 days she had noted increasing  episodes of chest pain with exertion and also rest pain at night.  She was  admitted to the hospital from the [facility] office,  stabilized medically,  underwent repeat cardiac catheterization by Dr. Juanda Chance, which demonstrated  again the left main disease, three-vessel disease, and restenosis in both  stents of the right coronary artery with greater than 95% stenosis of the  distal stent.  The patient had significant pulmonary disease, continues to  smoke on a daily basis, and has or 50 years.  Has known peripheral vascular  disease.  Because of her unstable symptoms and three-vessel disease,  including 60% left main, 70-80% LAD, 70-80% diagonal 2, 80% stenosis of the  OM-1 and circumflex, and in-stent restenosis of greater than 95% of the  right coronary artery, bypass grafting was recommended in spite of increased  risk, especially because of her pulmonary status.  The patient agreed and  signed informed consent.   DESCRIPTION OF PROCEDURE:  With Swan-Ganz and arterial line monitors in  place, the patient underwent general endotracheal anesthesia without  incident.  The skin of the chest and legs was prepped with Betadine and  draped in the usual  sterile manner.  Using the [employer] endovein harvesting  system, vein was harvested from the right thigh.  The scope was then turned  inferiorly and vein harvested to the midcalf.  The vein was of adequate  quality and caliber.  A median sternotomy was performed.  The left internal  mammary artery was dissected down as a pedicle graft.  The distal artery was  divided, had good free flow.  The pericardium was opened.  The patient had  evidence of old inferior myocardial infarction.  She was systemically  heparinized, the ascending aorta and the right atrium were cannulated, and  the aortic root vent cardioplegia needle was introduced into the ascending  aorta.  The patient was placed on cardiopulmonary bypass 2.4 L/min. per sq.  m.  Sites of anastomosis were selected and dissected out of the epicardium.  The patient's body temperature was cooled to 30 degrees, the aortic   crossclamp was applied, 500 mL of cold blood potassium cardioplegia was  administered with rapid diastolic arrest of the heart.  Myocardial septal  temperature was monitored throughout the crossclamp period.  Attention was  turned first to the posterior descending coronary artery, which was opened,  was a diffusely diseased vessel but did admit a 1.5 mm probe distally.  Using a running 7-0 Prolene, distal anastomosis was performed with a segment  of reversed saphenous vein graft.  Attention was then turned to the OM-1  vessel, which was opened and admitted a 1.5 mm probe. Using a diamond-type  side-to-side anastomosis was carried out with a segment of reversed  saphenous vein graft.  The distal extent of the same vein was then carried  onto the distal circumflex, which was approximately the same size as the  first OM.  It admitted a 1.5 mm probe.  Using a running 7-0 Prolene, a  distal anastomosis was performed.  Additional cold blood cardioplegia  administered down the vein graft.  The diagonal coronary artery was then  opened and was approximately 1 mm in size.  Using a running 7-0 Prolene, a  distal anastomosis was performed.  Attention was then turned to the left  anterior descending coronary artery, which was opened in the midportion of  the vessel.  There was extensive disease throughout the distal vessel.  The  artery was opened and admitted a 1.5 mm probe distally.  Using a running 8-0  Prolene, the left internal mammary artery was anastomosed to the left  anterior descending coronary artery.  With the crossclamp still in place,  three punch aortotomies were performed.  Each of the three vein grafts were  anastomosed to the ascending aorta.  Air was evacuated from the grafts and  the partial occlusion clamp was removed.  Sites of anastomosis were  inspected.  The patient spontaneously converted to a sinus rhythm on low- dose dopamine.  She was then ventilated and weaned from  cardiopulmonary  bypass.  She remained hemodynamically stable, decannulated in the usual  fashion.  Protamine sulfate was administered with the operative field  hemostatic.  Two atrial and two ventricular pacing wires were applied, graft  markers were applied, a left pleural tube and two mediastinal tubes left in  place.  The sternum was closed with #6 stainless steel wire.  The fascia  closed with interrupted 0 Vicryl, running 3-0 Vicryl in the subcutaneous  tissue, and 4-0 subcuticular stitch in the skin edges.  A dry dressing was  applied.  Sponge and needle count was reported as correct  at completion of  the procedure.  The patient tolerated the procedure without obvious  complication and was transferred to the surgical intensive care unit for  further postoperative care.                                               Gwenith Daily Tyrone Sage, M.D.    Tyson Babinski  D:  05/23/2003  T:  05/23/2003  Job:  [id number]   cc:   Charlies Constable, M.D.

## 2010-09-12 NOTE — Cardiovascular Report (Signed)
NAME:  [name], [name]                         ACCOUNT NO.:  [id number]   MEDICAL RECORD NO.:  [id number]                   PATIENT TYPE:  OIB   LOCATION:  6501                                 FACILITY:  [facility]   PHYSICIAN:  Learta Codding, M.D. [facility]             DATE OF BIRTH:  January 20, 1930   DATE OF PROCEDURE:  08/21/2003  DATE OF DISCHARGE:  08/21/2003                              CARDIAC CATHETERIZATION   PROCEDURE PERFORMED:  1. Left heart catheterization with selective coronary angiography.  2. Selective graft injection.  3. Aortic root injection x2.  4. No ventriculography was performed.   DIAGNOSIS:  1. Patent graft anatomy with saphenous vein graft to the posterior     descending, diagonal and sequential graft to OM-1 and circumflex coronary     artery.  2. 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   INDICATION:  The patient is a 77 year old female who earlier this year  underwent coronary artery bypass grafting.  The patient recently reported  substernal chest pain to Dr. Samule Ohm and she was referred for diagnostic  catheterization to assess her coronary anatomy.   DESCRIPTION OF PROCEDURE:  After informed consent was obtained, the patient  was brought to the catheterization laboratory.  The right groin was  sterilely prepped and draped.  5-French arterial sheath was placed using  modified Seldinger technique.  Selective coronary angiography was performed  with 5 [place] JL-4 catheters for the left coronary system.  Left bypass  graft catheter was used for the left-sided graft and 5 [place] JR-4 catheter  was used for the graft to the right coronary artery.  An angled pigtail  catheter was used for aortic root injection.  Aortic root injection was  performed in the LAO projection.  No ventriculography was performed to limit  dye administration.  After termination of the procedure, all  catheters and  sheaths were removed and the patient was brought back to the holding area.   FINDINGS:   HEMODYNAMICS:  Left ventricular pressure 144/69 mmHg.   VENTRICULOGRAPHY:  Not performed.   AORTIC ROOT INJECTION:  No definite aortic insufficiency.  On the second  injection, the pigtail catheter was placed at the origin of the innominate  artery due to inability to cannulate the left subclavian artery.  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  Due to unusual anatomy, no  further attempts were made to engage a LIMA catheter into the left  subclavian artery.  There was otherwise no evidence of aortic root  dilatation or aneurysm.   SELECTIVE CORONARY ANGIOGRAPHY:  1. Left main coronary artery was patent.  2. The LAD was a moderate size vessel with diffuse 60% stenosis in its     proximal segment  just prior to first diagonal branch.  The second     diagonal branch demonstrated competitive flow.  There was 40-50% mid LAD     lesion.  There was also competitive flow noted in the mid LAD secondary     to the insertion of the LIMA graft.  3. Circumflex coronary was a moderate size vessel with diffuse 70% stenosis     proximal segment.  The first and second obtuse marginal branches were     occluded.  4. The right coronary artery had a long diffuse stenosis of 70-80% in the     mid segment.  There was a stent visualized in the distal portion and     artery beyond this point was at a subtotal occlusion.   GRAFT ANATOMY:  1. Saphenous vein graft to diagonal was patent.  2. Saphenous vein graft in sequential fashion to OM-1 and OM-2 was patent.  3. The saphenous vein graft to posterior descending artery was patent with     no significant distal disease beyond the graft insertion.  4. LIMA graft:  As outlined above, we did not make any attempts to inject     the LIMA graft due to unusual  anatomy and take off of the left subclavian     artery.   CONCLUSION:  Angiographic images were reviewed with Dr. Samule Ohm.  It is  unlikely that the patient's chest pain is secondary to LIMA stenosis.  However, the patient will be scheduled for an adenosine Cardiolite in two to  three weeks to rule out ischemia in the anterior distribution.  The patient  has also been referred for CT angiography to evaluate the anomalous origin  of her great vessels.                                               Learta Codding, M.D. [facility]    GED/MEDQ  D:  08/23/2003  T:  08/24/2003  Job:  [id number]   cc:   Salvadore Farber, M.D. [facility]  [address]  [address]  [place]  [place] [address]

## 2010-09-12 NOTE — Assessment & Plan Note (Signed)
[facility]                            CARDIOLOGY OFFICE NOTE   ZAMARA, COZAD                      MRN:          [id number]  DATE:08/17/2006                            DOB:          13-Nov-1929    HISTORY OF PRESENT ILLNESS:  Ms. Bergevin is a 76 year old lady with  coronary disease for which she is status post inferior myocardial  infarction and CABG in January 2005. LV systolic function is normal. She  has generally been doing well. Unfortunately, she continues to smoke.  She did not tolerate Chantix due to nausea.   CURRENT MEDICATIONS:  1. Lisinopril 40 mg daily.  2. Zetia 10 mg daily.  3. Aspirin 81 mg daily.  4. Multivitamin.  5. Ocuvite.  6. Calcium.  7. Vitamin D.  8. Metoprolol 50 mg twice daily.  9. Albuterol p.r.n.  10.Verapamil CD 180 mg daily.  11.Chlorthalidone, dose not clear.  12.Lovastatin 20 mg daily.   PHYSICAL EXAMINATION:  GENERAL:  She is generally well appearing in no  distress.  VITAL SIGNS:  Heart rate 60, blood pressure 138/82 and weight of 172  pounds. The weight is down 3 pounds from a year ago.  NECK:  She has no jugular venous distention, thyromegaly or  lymphadenopathy.  LUNGS:  Clear to auscultation though the expiratory phase moderately  prolonged.  CARDIAC:  She has a nondisplaced point of maximal cardiac impulse. There  is a regular rate and rhythm without murmur or rub. There is an S4 but  no S3.  ABDOMEN:  Soft, nondistended, nontender. There is no hepatosplenomegaly.  Bowel sounds are normal. No pulsatile midline mass.  EXTREMITIES:  Without clubbing, cyanosis, edema, or ulceration.  Carotids pulses 2+ bilaterally with a bruit on the right.   Electrocardiogram  shows normal EKG.   IMPRESSION/RECOMMENDATIONS:  1. Coronary disease:  Doing nicely after coronary artery bypass graft      and inferior myocardial infarction. Ejection fraction is normal.      Continue secondary prevention with  aspirin, beta blocker and ACE      inhibitor.  2. Hypertension:  Blood pressure control not quite optimal. She wishes      to work on exercise and smoking cessation. This would certainly      help.  3. Continued tobacco abuse:  She will try again to quit.  4. Cerebrovascular disease:  Asymptomatic, 40-59% stenosis on the left      and less than 40% stenosis on the right. Due for repeat study in      September 2008.  5. Hypercholesterolemia:  Followed by Dr. Jeannetta Nap.   I will be departing. I will therefore have her followup with Dr. Willa Rough in one year's time.     Salvadore Farber, MD  Electronically Signed    WED/MedQ  DD: 08/17/2006  DT: 08/17/2006  Job #: [id number]

## 2010-09-25 ENCOUNTER — Emergency Department (HOSPITAL_COMMUNITY)
Admission: EM | Admit: 2010-09-25 | Discharge: 2010-09-25 | Disposition: A | Discharge status: Home / Self Care | Payer: No Typology Code available for payment source | Attending: Emergency Medicine | Admitting: Emergency Medicine

## 2010-09-25 ENCOUNTER — Encounter (HOSPITAL_COMMUNITY): Payer: Self-pay | Admitting: Radiology

## 2010-09-25 ENCOUNTER — Emergency Department (HOSPITAL_COMMUNITY): Payer: No Typology Code available for payment source

## 2010-09-25 DIAGNOSIS — R079 Chest pain, unspecified: Secondary | ICD-10-CM | POA: Insufficient documentation

## 2010-09-25 DIAGNOSIS — R110 Nausea: Secondary | ICD-10-CM | POA: Insufficient documentation

## 2010-09-25 DIAGNOSIS — I2510 Atherosclerotic heart disease of native coronary artery without angina pectoris: Secondary | ICD-10-CM | POA: Insufficient documentation

## 2010-09-25 DIAGNOSIS — I10 Essential (primary) hypertension: Secondary | ICD-10-CM | POA: Insufficient documentation

## 2010-09-25 DIAGNOSIS — M546 Pain in thoracic spine: Secondary | ICD-10-CM | POA: Insufficient documentation

## 2010-09-25 DIAGNOSIS — S20219A Contusion of unspecified front wall of thorax, initial encounter: Secondary | ICD-10-CM | POA: Insufficient documentation

## 2010-09-25 DIAGNOSIS — R0602 Shortness of breath: Secondary | ICD-10-CM | POA: Insufficient documentation

## 2010-09-25 DIAGNOSIS — Z79899 Other long term (current) drug therapy: Secondary | ICD-10-CM | POA: Insufficient documentation

## 2010-09-25 DIAGNOSIS — Z7982 Long term (current) use of aspirin: Secondary | ICD-10-CM | POA: Insufficient documentation

## 2010-09-25 DIAGNOSIS — E785 Hyperlipidemia, unspecified: Secondary | ICD-10-CM | POA: Insufficient documentation

## 2010-09-25 DIAGNOSIS — I252 Old myocardial infarction: Secondary | ICD-10-CM | POA: Insufficient documentation

## 2010-09-25 LAB — DIFFERENTIAL
Lymphocytes Relative: 11 % — ABNORMAL LOW (ref 12–46)
Monocytes Absolute: 0.6 10*3/uL (ref 0.1–1.0)
Monocytes Relative: 7 % (ref 3–12)
Neutro Abs: 6.7 10*3/uL (ref 1.7–7.7)

## 2010-09-25 LAB — CBC
HCT: 39.2 % (ref 36.0–46.0)
Hemoglobin: 13.4 g/dL (ref 12.0–15.0)
MCH: 30.3 pg (ref 26.0–34.0)
MCHC: 34.2 g/dL (ref 30.0–36.0)
MCV: 88.7 fL (ref 78.0–100.0)

## 2010-09-25 LAB — POCT I-STAT, CHEM 8
BUN: 11 mg/dL (ref 6–23)
Calcium, Ion: 1.18 mmol/L (ref 1.12–1.32)
Chloride: 95 meq/L — ABNORMAL LOW (ref 96–112)
Creatinine, Ser: 1.1 mg/dL (ref 0.4–1.2)
Glucose, Bld: 97 mg/dL (ref 70–99)
HCT: 42 % (ref 36.0–46.0)
Hemoglobin: 14.3 g/dL (ref 12.0–15.0)
Potassium: 4 meq/L (ref 3.5–5.1)
Sodium: 130 meq/L — ABNORMAL LOW (ref 135–145)
TCO2: 29 mmol/L (ref 0–100)

## 2010-09-25 LAB — SAMPLE TO BLOOD BANK

## 2010-09-25 LAB — CK TOTAL AND CKMB (NOT AT ARMC): CK, MB: 5.4 ng/mL — ABNORMAL HIGH (ref 0.3–4.0)

## 2010-09-25 MED ORDER — IOHEXOL 300 MG/ML  SOLN
100.0000 mL | Freq: Once | INTRAMUSCULAR | Status: AC | PRN
  Administered 2010-09-25: 100 mL via INTRAVENOUS

## 2010-10-08 ENCOUNTER — Encounter: Payer: Self-pay | Admitting: Cardiology

## 2010-11-24 ENCOUNTER — Encounter: Payer: Self-pay | Admitting: Cardiology

## 2010-11-24 DIAGNOSIS — E871 Hypo-osmolality and hyponatremia: Secondary | ICD-10-CM | POA: Insufficient documentation

## 2010-11-24 DIAGNOSIS — R9430 Abnormal result of cardiovascular function study, unspecified: Secondary | ICD-10-CM | POA: Insufficient documentation

## 2010-11-24 DIAGNOSIS — I358 Other nonrheumatic aortic valve disorders: Secondary | ICD-10-CM | POA: Insufficient documentation

## 2010-11-24 DIAGNOSIS — I779 Disorder of arteries and arterioles, unspecified: Secondary | ICD-10-CM | POA: Insufficient documentation

## 2010-11-24 DIAGNOSIS — K922 Gastrointestinal hemorrhage, unspecified: Secondary | ICD-10-CM | POA: Insufficient documentation

## 2010-11-24 DIAGNOSIS — I10 Essential (primary) hypertension: Secondary | ICD-10-CM | POA: Insufficient documentation

## 2010-11-24 DIAGNOSIS — I719 Aortic aneurysm of unspecified site, without rupture: Secondary | ICD-10-CM | POA: Insufficient documentation

## 2010-11-24 DIAGNOSIS — R001 Bradycardia, unspecified: Secondary | ICD-10-CM | POA: Insufficient documentation

## 2010-11-24 DIAGNOSIS — J449 Chronic obstructive pulmonary disease, unspecified: Secondary | ICD-10-CM | POA: Insufficient documentation

## 2010-11-24 DIAGNOSIS — A159 Respiratory tuberculosis unspecified: Secondary | ICD-10-CM | POA: Insufficient documentation

## 2010-11-24 DIAGNOSIS — Z951 Presence of aortocoronary bypass graft: Secondary | ICD-10-CM | POA: Insufficient documentation

## 2010-11-24 DIAGNOSIS — E785 Hyperlipidemia, unspecified: Secondary | ICD-10-CM | POA: Insufficient documentation

## 2010-11-24 DIAGNOSIS — R55 Syncope and collapse: Secondary | ICD-10-CM | POA: Insufficient documentation

## 2010-11-24 DIAGNOSIS — Z720 Tobacco use: Secondary | ICD-10-CM | POA: Insufficient documentation

## 2010-11-24 DIAGNOSIS — I739 Peripheral vascular disease, unspecified: Secondary | ICD-10-CM

## 2010-11-25 ENCOUNTER — Ambulatory Visit (INDEPENDENT_AMBULATORY_CARE_PROVIDER_SITE_OTHER): Payer: Medicare Other | Admitting: Cardiology

## 2010-11-25 ENCOUNTER — Encounter: Payer: Self-pay | Admitting: Cardiology

## 2010-11-25 DIAGNOSIS — I10 Essential (primary) hypertension: Secondary | ICD-10-CM

## 2010-11-25 DIAGNOSIS — R609 Edema, unspecified: Secondary | ICD-10-CM

## 2010-11-25 DIAGNOSIS — I2510 Atherosclerotic heart disease of native coronary artery without angina pectoris: Secondary | ICD-10-CM

## 2010-11-25 DIAGNOSIS — F17200 Nicotine dependence, unspecified, uncomplicated: Secondary | ICD-10-CM

## 2010-11-25 DIAGNOSIS — I779 Disorder of arteries and arterioles, unspecified: Secondary | ICD-10-CM

## 2010-11-25 DIAGNOSIS — Z720 Tobacco use: Secondary | ICD-10-CM

## 2010-11-25 MED ORDER — ALBUTEROL SULFATE HFA 108 (90 BASE) MCG/ACT IN AERS: 2.0000 | INHALATION_SPRAY | Freq: Four times a day (QID) | RESPIRATORY_TRACT | Status: DC | PRN

## 2010-11-25 MED ORDER — FUROSEMIDE 20 MG PO TABS: 20.0000 mg | ORAL_TABLET | Freq: Every day | ORAL | Status: DC

## 2010-11-25 NOTE — Progress Notes (Signed)
HPI Patient is seen for the followup of coronary artery disease.  I saw her last, January, 2012.  She underwent CABG in 2005.  She has good LV function.  She has not been having any chest pain.  She has stable carotid artery disease.  The patient has noted some swelling in her legs.  Her diuretic had been previously stopped for the question of hyponatremia.  She is affected by the current edema.  I believe he is fluid overloaded. Allergies  Allergen Reactions  . Codeine   . Penicillins   . Ranitidine Hcl   . Sulfonamide Derivatives     REACTION: nausea    Current Outpatient Prescriptions  Medication Sig Dispense Refill  . albuterol (PROAIR HFA) 108 (90 BASE) MCG/ACT inhaler Inhale 2 puffs into the lungs every 6 (six) hours as needed.        Marland Kitchen aspirin 81 MG tablet Take 81 mg by mouth daily.        . budesonide-formoterol (SYMBICORT) 160-4.5 MCG/ACT inhaler Inhale 2 puffs into the lungs 2 (two) times daily.        . ergocalciferol (VITAMIN D2) 50000 UNITS capsule 1 capsule by mouth every other week      . ibuprofen (ADVIL,MOTRIN) 200 MG tablet Take 200 mg by mouth every 8 (eight) hours as needed.        . IPRATROPIUM BROMIDE NA 2 sprays by Nasal route as needed.        Marland Kitchen lisinopril (PRINIVIL,ZESTRIL) 40 MG tablet Take 40 mg by mouth daily.        Marland Kitchen lovastatin (MEVACOR) 40 MG tablet Take 1/2 tablet by mouth once daily       . metoprolol (LOPRESSOR) 100 MG tablet Take 2 tablets by mouth two times daily       . Multiple Vitamins-Minerals (ICAPS MV) TABS Take 1 tablet by mouth 2 (two) times daily.        . nicotine (NICOTROL) 10 MG inhaler Inhale 1 puff into the lungs as needed.        . polyethylene glycol powder (MIRALAX) powder Take 17 g by mouth daily as needed.       . potassium chloride SA (K-DUR,KLOR-CON) 20 MEQ tablet Take 20 mEq by mouth 2 (two) times daily.        Marland Kitchen tiotropium (SPIRIVA) 18 MCG inhalation capsule Place 18 mcg into inhaler and inhale daily.        . verapamil  (CALAN-SR) 180 MG CR tablet Take 180 mg by mouth 2 (two) times daily.          History   Social History  . Marital Status: Widowed    Spouse Name: N/A    Number of Children: 1  . Years of Education: N/A   Occupational History  . retired     [employer]   Social History Main Topics  . Smoking status: Current Everyday Smoker -- 0.8 packs/day for 59 years    Types: Cigarettes  . Smokeless tobacco: Not on file  . Alcohol Use: No  . Drug Use: Not on file  . Sexually Active: Not on file   Other Topics Concern  . Not on file   Social History Narrative  . No narrative on file    No family history on file.  Past Medical History  Diagnosis Date  . CAD (coronary artery disease)   . Aortic valve sclerosis   . Hypertension   . Hyperlipidemia   . GI bleed  AVMs  . Aneurysm, aortic     thoracic aorta, stable at 4.1 cm, chest CT, May, 2012  . Syncope     Nitroglycerin plus a diuretic, April, 2009  . Hyponatremia   . COPD (chronic obstructive pulmonary disease)   . Tobacco abuse   . Bradycardia   . Hx of CABG     2005  . Ejection fraction     EF 60-65%, echo, July, 2010  . Carotid artery disease     Doppler, October, 2011, stable, 0-39% RIC A., 40-59% LICA  . Tuberculosis     Exposures to tuberculosis 1970s, has tested negative by the health Department    Past Surgical History  Procedure Date  . Cholecystectomy   . Carotid endartercetomy   . Abdominal hysterectomy   . Coronary artery bypass graft 2005    ROS  Patient denies fever, chills, headache, sweats, rash, change in vision, change in hearing, chest pain, nausea vomiting, urinary symptoms.  All other systems are reviewed and are negative.  PHYSICAL EXAM Patient is stable.  She is wearing her home O2.  She is oriented to person time and place affect is normal.  Head is atraumatic.  She has significant kyphosis of the cervical spine and thoracic spine.  There is no jugular distention.  Lungs reveal decreased breath  sounds.  There are no rales.  Cardiac exam reveals S1-S2.  No clicks or significant murmurs.  The abdomen is soft.  There is 1-2+ peripheral edema.  There are no skin rashes.  There is no other musculoskeletal deformities. Filed Vitals:   11/25/10 1405  BP: 148/90  Pulse: 53  Height: 5\' 9"  (1.753 m)  Weight: 175 lb (79.379 kg)    EKG Is done today and reviewed by me.  She has very mild sinus bradycardia and no significant QRS change.  ASSESSMENT & PLAN

## 2010-11-25 NOTE — Assessment & Plan Note (Signed)
Carotid artery disease is stable.  She will need a one-year followup.

## 2010-11-25 NOTE — Assessment & Plan Note (Signed)
Blood pressure is mildly elevated today.  I believe she will improve if we take some volume off.

## 2010-11-25 NOTE — Assessment & Plan Note (Signed)
Coronary disease is stable. No change in therapy at this time. 

## 2010-11-25 NOTE — Assessment & Plan Note (Signed)
I believe there is some volume overload.  In the past diuretic has been stopped by other physicians because of hyponatremia.  I feel it is safe for [place] to try a small dose of Lasix at this time.  We will arrange for this and careful followup.

## 2010-11-25 NOTE — Patient Instructions (Signed)
Your physician recommends that you schedule a follow-up appointment in: 3-4 weeks  Your physician recommends that you return for lab work in: 10 days  Your physician has recommended you make the following change in your medication: start Furosemide 20 mg daily

## 2010-11-25 NOTE — Assessment & Plan Note (Signed)
Patient continues to smoke.  She is counseled to stop.

## 2010-11-27 ENCOUNTER — Telehealth: Payer: Self-pay | Admitting: Cardiology

## 2010-11-27 NOTE — Telephone Encounter (Signed)
Returned call to ms Stringfellow and advised --per dr Myrtis Ser, she does not need to take lasix anymore--pt agrees--advised to f/u with lab work, even tho not taking lasix anymore--nt

## 2010-11-27 NOTE — Telephone Encounter (Signed)
Attempted to call pt--line busy x 2--nt

## 2010-11-27 NOTE — Telephone Encounter (Signed)
Dr Leary Roca Jeziorski calling stating she was here to see you 8/1 and you d/ced her HCTZ and put her on lasix--she has taken 1 tab this am and is now very sick on her stomach and states she will not take anymore--advised i would pass message along to dr Myrtis Ser, do not take your old med(HCTZ)  Until you hear back from [place], and if you get worse please go to nearest ED--PT AGREES

## 2010-11-27 NOTE — Telephone Encounter (Signed)
Pt was put on a furosemide 20mg  at last visit and it is making her very sick and she want to talk to someone right away/lg

## 2010-11-27 NOTE — Telephone Encounter (Signed)
Agree with plan.  I had not stopped her hydrochlorothiazide it had been stopped by her primary physician.  I did start a small dose of Lasix.  There is no good reason why she should get GI symptoms from this.  However agree to hold the Lasix and I will see her for her scheduled followup

## 2010-12-26 ENCOUNTER — Encounter: Payer: Self-pay | Admitting: Cardiology

## 2010-12-26 ENCOUNTER — Ambulatory Visit (INDEPENDENT_AMBULATORY_CARE_PROVIDER_SITE_OTHER): Payer: Medicare Other | Admitting: Cardiology

## 2010-12-26 DIAGNOSIS — R609 Edema, unspecified: Secondary | ICD-10-CM

## 2010-12-26 DIAGNOSIS — Z720 Tobacco use: Secondary | ICD-10-CM

## 2010-12-26 DIAGNOSIS — F17200 Nicotine dependence, unspecified, uncomplicated: Secondary | ICD-10-CM

## 2010-12-26 DIAGNOSIS — I2510 Atherosclerotic heart disease of native coronary artery without angina pectoris: Secondary | ICD-10-CM

## 2010-12-26 NOTE — Assessment & Plan Note (Signed)
At this point I am not convinced that she has any significant total body fluid overload.  She says that she responded very poorly to Lasix.  We will not try this again.  No other diuretics will be added at this time.  She is tolerating hydrochlorothiazide in the past however and it could be tried if needed

## 2010-12-26 NOTE — Assessment & Plan Note (Signed)
Unfortunately the patient continues to smoke despite her severe lung disease.  She is counseled to stop.

## 2010-12-26 NOTE — Assessment & Plan Note (Signed)
Coronary disease is stable. No further workup needed. 

## 2010-12-26 NOTE — Patient Instructions (Signed)
Your physician wants you to follow-up in: 6 months with Dr Myrtis Ser.(February 2013). You will receive a reminder letter in the mail two months in advance. If you don't receive a letter, please call our office to schedule the follow-up appointment.

## 2010-12-26 NOTE — Progress Notes (Signed)
HPI Patient is seen for followup coronary disease and fluid status.  I saw her last November 25, 2010.  I thought that she might be mildly fluid overloaded.  We tried a small dose of Lasix.  She says that she immediately had nausea and vomiting from this and it was stopped.  Historically she has tolerated hydrochlorothiazide in the past.  She says it has been put on hold by her primary physician for hyponatremia.  She does have some mild edema.  However it does disappear at nighttime.  She does not have PND or orthopnea Allergies  Allergen Reactions  . Codeine   . Penicillins   . Ranitidine Hcl   . Sulfonamide Derivatives     REACTION: nausea    Current Outpatient Prescriptions  Medication Sig Dispense Refill  . albuterol (PROAIR HFA) 108 (90 BASE) MCG/ACT inhaler Inhale 2 puffs into the lungs every 6 (six) hours as needed.  1 each  2  . aspirin 81 MG tablet Take 81 mg by mouth daily.        . budesonide-formoterol (SYMBICORT) 160-4.5 MCG/ACT inhaler Inhale 2 puffs into the lungs 2 (two) times daily.        . ergocalciferol (VITAMIN D2) 50000 UNITS capsule 1 capsule by mouth every other week      . ibuprofen (ADVIL,MOTRIN) 200 MG tablet Take 200 mg by mouth every 8 (eight) hours as needed.        . IPRATROPIUM BROMIDE NA 2 sprays by Nasal route as needed.        Marland Kitchen lisinopril (PRINIVIL,ZESTRIL) 40 MG tablet Take 40 mg by mouth daily.        Marland Kitchen lovastatin (MEVACOR) 40 MG tablet Take 1/2 tablet by mouth once daily       . metoprolol (LOPRESSOR) 100 MG tablet Take 2 tablets by mouth two times daily       . Multiple Vitamins-Minerals (ICAPS MV) TABS Take 1 tablet by mouth 2 (two) times daily.        . nicotine (NICOTROL) 10 MG inhaler Inhale 1 puff into the lungs as needed.        . polyethylene glycol powder (MIRALAX) powder Take 17 g by mouth daily as needed.       . potassium chloride SA (K-DUR,KLOR-CON) 20 MEQ tablet Take 20 mEq by mouth 2 (two) times daily.        Marland Kitchen tiotropium (SPIRIVA) 18 MCG  inhalation capsule Place 18 mcg into inhaler and inhale daily.        . verapamil (CALAN-SR) 180 MG CR tablet Take 180 mg by mouth 2 (two) times daily.          History   Social History  . Marital Status: Widowed    Spouse Name: N/A    Number of Children: 1  . Years of Education: N/A   Occupational History  . retired     [employer]   Social History Main Topics  . Smoking status: Current Everyday Smoker -- 0.8 packs/day for 59 years    Types: Cigarettes  . Smokeless tobacco: Not on file  . Alcohol Use: No  . Drug Use: Not on file  . Sexually Active: Not on file   Other Topics Concern  . Not on file   Social History Narrative  . No narrative on file    No family history on file.  Past Medical History  Diagnosis Date  . CAD (coronary artery disease)   . Aortic valve sclerosis   .  Hypertension   . Hyperlipidemia   . GI bleed     AVMs  . Aneurysm, aortic     thoracic aorta, stable at 4.1 cm, chest CT, May, 2012  . Syncope     Nitroglycerin plus a diuretic, April, 2009  . Hyponatremia   . COPD (chronic obstructive pulmonary disease)   . Tobacco abuse   . Bradycardia   . Hx of CABG     2005  . Ejection fraction     EF 60-65%, echo, July, 2010  . Carotid artery disease     Doppler, October, 2011, stable, 0-39% RIC A., 40-59% LICA  . Tuberculosis     Exposures to tuberculosis 1970s, has tested negative by the health Department  . Edema     July, 2012    Past Surgical History  Procedure Date  . Cholecystectomy   . Carotid endartercetomy   . Abdominal hysterectomy   . Coronary artery bypass graft 2005    ROS  Patient denies fever, chills, headache, sweats, rash, change in vision, change in hearing, chest pain, cough, nausea vomiting, urinary symptoms.  All other systems are reviewed and are negative.  PHYSICAL EXAM Patient is stable today.  She is wearing her portable oxygen.  She is oriented to person time and place.  Affect is normal.  Head is atraumatic.   There is no jugular venous distention.  Lung sounds are distant.  There is no respiratory distress.  Cardiac exam reveals S1 and S2.  There is a soft systolic murmur.  The abdomen is soft.  She does have trace to 1+ edema bilaterally.  There no skin rashes.  There is no musculoskeletal deformity. Filed Vitals:   12/26/10 1540  BP: 173/68  Pulse: 55  Weight: 175 lb (79.379 kg)    EKG is not done today.  ASSESSMENT & PLAN

## 2011-01-01 ENCOUNTER — Encounter: Payer: Self-pay | Admitting: Pulmonary Disease

## 2011-01-01 ENCOUNTER — Ambulatory Visit (INDEPENDENT_AMBULATORY_CARE_PROVIDER_SITE_OTHER): Payer: Medicare Other | Admitting: Pulmonary Disease

## 2011-01-01 VITALS — BP 162/70 | HR 57 | Temp 97.4°F | Ht 71.0 in | Wt 174.6 lb

## 2011-01-01 DIAGNOSIS — J449 Chronic obstructive pulmonary disease, unspecified: Secondary | ICD-10-CM

## 2011-01-01 NOTE — Progress Notes (Signed)
  Subjective:    Patient ID: [name], female    DOB: April 02, 1930, 77 y.o.   MRN: [id number]  HPI 33 , smoker presents for FU of COPD.  She has been on home O2 since dec 2010 ([facility]) after a chest cold requiring prednisone, CXR then showed hyperinflation. She smokes 1/2 PPD, about 30 Pyrs - chantix made her sick & she is afraid of cardiac side effects with patches. She sees Dr Myrtis Ser for CAD & has tolerated lsisnopril & metoprolol, echo 7/10 showed nml LV fn & non dilated RV. An episode of syncope in 2009 was attributed to NTG & diuretics.  She reports clear phlegm, nasal drip & sneezing - no seasonal allergies, nasal atrovent dries her out.  She has lt >> Rt pedal edema  Desaturated to 86% onRA with minimal ambulation.  reviewed PFTs 08/19/09 >> severe airway obstruction, FEV1 47%, no BD response, air trapping +, severe decrease in diffusion  CT angio 5/12 asc aortic aneurysm 41mm, hepatic cysts  01/01/2011 -about 10 cigs, chantix made her sick Uses albuterol MDI 4-5 /day -not interested in exercise program , compliant with O2 , helpful      Review of Systems Patient denies significant dyspnea,cough, hemoptysis,  chest pain, palpitations, pedal edema, orthopnea, paroxysmal nocturnal dyspnea, lightheadedness, nausea, vomiting, abdominal or  leg pains      Objective:   Physical Exam  Gen. Pleasant, well-nourished, in no distress ENT - no lesions, no post nasal drip Neck: No JVD, no thyromegaly, no carotid bruits Lungs: no use of accessory muscles, no dullness to percussion, decreased without rales or rhonchi  Cardiovascular: Rhythm regular, heart sounds  normal, no murmurs or gallops, no peripheral edema Musculoskeletal: No deformities, no cyanosis or clubbing        Assessment & Plan:

## 2011-01-01 NOTE — Patient Instructions (Signed)
You have to quit smoking ! 

## 2011-01-04 NOTE — Assessment & Plan Note (Signed)
Clearly focus here is on smoking cessation. Ct spiriva, symbicort, O2 Discussed dangers of O2 & smoking

## 2011-01-20 LAB — BASIC METABOLIC PANEL
BUN: 15
CO2: 33 — ABNORMAL HIGH
Calcium: 9.1
Calcium: 9.2
Creatinine, Ser: 0.89
GFR calc Af Amer: >60
GFR calc non Af Amer: >60
GFR calc non Af Amer: >60
GFR calc non Af Amer: >60
Glucose, Bld: 93
Potassium: 3.3 — ABNORMAL LOW
Potassium: 3.4 — ABNORMAL LOW
Sodium: 125 — ABNORMAL LOW
Sodium: 125 — ABNORMAL LOW

## 2011-01-20 LAB — URINE MICROSCOPIC-ADD ON

## 2011-01-20 LAB — COMPREHENSIVE METABOLIC PANEL
ALT: 16
BUN: 17
Calcium: 10
Creatinine, Ser: 0.89
Glucose, Bld: 120 — ABNORMAL HIGH
Sodium: 126 — ABNORMAL LOW
Total Protein: 6

## 2011-01-20 LAB — DIFFERENTIAL
Lymphocytes Relative: 8 — ABNORMAL LOW
Lymphs Abs: 0.7
Monocytes Relative: 5
Neutro Abs: 7.6
Neutrophils Relative %: 85 — ABNORMAL HIGH

## 2011-01-20 LAB — URINALYSIS, ROUTINE W REFLEX MICROSCOPIC
Ketones, ur: 15 — AB
Nitrite: NEGATIVE
Specific Gravity, Urine: 1.02
pH: 7.5

## 2011-01-20 LAB — CBC
Hemoglobin: 14.3
MCHC: 34.6
RDW: 12.6

## 2011-01-20 LAB — LIPID PANEL
HDL: 42
VLDL: 30

## 2011-01-20 LAB — URINE CULTURE: Special Requests: NEGATIVE

## 2011-01-20 LAB — TROPONIN I: Troponin I: 0.01

## 2011-01-20 LAB — CARDIAC PANEL(CRET KIN+CKTOT+MB+TROPI)
CK, MB: 2.5
CK, MB: 3.2
Relative Index: INVALID
Total CK: 39
Total CK: 55
Troponin I: 0.02
Troponin I: 0.02

## 2011-01-20 LAB — POCT CARDIAC MARKERS
CKMB, poc: <1 — ABNORMAL LOW
Myoglobin, poc: 40.1
Myoglobin, poc: 86.2
Operator id: 151321

## 2011-01-20 LAB — HEMOGLOBIN A1C
Hgb A1c MFr Bld: 5.4
Mean Plasma Glucose: 115

## 2011-02-04 LAB — BASIC METABOLIC PANEL
BUN: 1 — ABNORMAL LOW
Chloride: 90 — ABNORMAL LOW
Glucose, Bld: 100 — ABNORMAL HIGH
Potassium: 3.9
Sodium: 126 — ABNORMAL LOW

## 2011-02-05 LAB — COMPREHENSIVE METABOLIC PANEL
ALT: 23
AST: 24
Albumin: 3.4 — ABNORMAL LOW
Alkaline Phosphatase: 46
Alkaline Phosphatase: 48
BUN: 13
CO2: 36 — ABNORMAL HIGH
Chloride: 84 — ABNORMAL LOW
Creatinine, Ser: 0.88
GFR calc Af Amer: >60
GFR calc non Af Amer: >60
Glucose, Bld: 123 — ABNORMAL HIGH
Glucose, Bld: 109 — ABNORMAL HIGH
Potassium: 3 — ABNORMAL LOW
Potassium: 4.4
Sodium: 123 — ABNORMAL LOW
Total Bilirubin: 0.8
Total Bilirubin: 0.8
Total Protein: 5.9 — ABNORMAL LOW

## 2011-02-05 LAB — URINALYSIS, ROUTINE W REFLEX MICROSCOPIC
Bilirubin Urine: NEGATIVE
Glucose, UA: NEGATIVE
Ketones, ur: NEGATIVE
Ketones, ur: NEGATIVE
Nitrite: NEGATIVE
Nitrite: NEGATIVE
Protein, ur: NEGATIVE
Specific Gravity, Urine: 1.013
Urobilinogen, UA: 1
pH: 8

## 2011-02-05 LAB — CK TOTAL AND CKMB (NOT AT ARMC)
CK, MB: 1.9
CK, MB: 2.2
Total CK: 47

## 2011-02-05 LAB — D-DIMER, QUANTITATIVE: D-Dimer, Quant: 0.29

## 2011-02-05 LAB — TROPONIN I
Troponin I: 0.01
Troponin I: 0.01

## 2011-02-05 LAB — HEPATIC FUNCTION PANEL
ALT: 21
AST: 28
Alkaline Phosphatase: 43
Indirect Bilirubin: 0.6
Total Protein: 5.4 — ABNORMAL LOW

## 2011-02-05 LAB — DIFFERENTIAL
Basophils Absolute: 0.1
Basophils Relative: 1
Lymphocytes Relative: 10 — ABNORMAL LOW
Monocytes Absolute: 0.5
Neutro Abs: 7.6
Neutrophils Relative %: 81 — ABNORMAL HIGH

## 2011-02-05 LAB — CBC
Hemoglobin: 12.4
Hemoglobin: 13
MCHC: 35.7
MCHC: 34.3
Platelets: 276
RBC: 3.82 — ABNORMAL LOW
RDW: 13.2
WBC: 7.3

## 2011-02-05 LAB — LIPASE, BLOOD: Lipase: 37

## 2011-02-05 LAB — AMYLASE: Amylase: 38

## 2011-02-05 LAB — B-NATRIURETIC PEPTIDE (CONVERTED LAB): Pro B Natriuretic peptide (BNP): 186 — ABNORMAL HIGH

## 2011-03-04 ENCOUNTER — Other Ambulatory Visit: Payer: Self-pay | Admitting: Cardiology

## 2011-03-04 DIAGNOSIS — I6529 Occlusion and stenosis of unspecified carotid artery: Secondary | ICD-10-CM

## 2011-03-06 ENCOUNTER — Encounter (INDEPENDENT_AMBULATORY_CARE_PROVIDER_SITE_OTHER): Payer: Medicare Other | Admitting: *Deleted

## 2011-03-06 DIAGNOSIS — I6529 Occlusion and stenosis of unspecified carotid artery: Secondary | ICD-10-CM

## 2011-03-13 ENCOUNTER — Encounter: Payer: Self-pay | Admitting: Cardiology

## 2011-05-06 ENCOUNTER — Ambulatory Visit: Payer: Medicare Other | Admitting: Adult Health

## 2011-05-07 ENCOUNTER — Ambulatory Visit (INDEPENDENT_AMBULATORY_CARE_PROVIDER_SITE_OTHER): Payer: Medicare Other | Admitting: Adult Health

## 2011-05-07 ENCOUNTER — Encounter: Payer: Self-pay | Admitting: Adult Health

## 2011-05-07 VITALS — BP 160/80 | HR 58 | Temp 96.6°F | Wt 177.8 lb

## 2011-05-07 DIAGNOSIS — J449 Chronic obstructive pulmonary disease, unspecified: Secondary | ICD-10-CM

## 2011-05-07 NOTE — Assessment & Plan Note (Signed)
Compensated on present regimen Encouraged on smoking cesstation  follow up Dr. Vassie Loll  In 4 months and As needed

## 2011-05-07 NOTE — Patient Instructions (Addendum)
Continue on current regimen.  MOST IMPORTANT IS TO STOP SMOKING.  follow up Dr. Vassie Loll  In 4 months  Follow up with your family doctor as your blood pressure is elevated.

## 2011-05-07 NOTE — Progress Notes (Signed)
  Subjective:    Patient ID: [name], female    DOB: 07/18/1929, 78 y.o.   MRN: [id number]  HPI  89 , smoker presents for FU of COPD.  She has been on home O2 since dec 2010 ([facility]) after a chest cold requiring prednisone, CXR then showed hyperinflation. She smokes 1/2 PPD, about 78 Pyrs - chantix made her sick & she is afraid of cardiac side effects with patches. She sees Dr Myrtis Ser for CAD & has tolerated lsisnopril & metoprolol, echo 7/10 showed nml LV fn & non dilated RV. An episode of syncope in 2009 was attributed to NTG & diuretics.  She reports clear phlegm, nasal drip & sneezing - no seasonal allergies, nasal atrovent dries her out.  She has lt >> Rt pedal edema  Desaturated to 86% onRA with minimal ambulation.  reviewed PFTs 08/19/09 >> severe airway obstruction, FEV1 47%, no BD response, air trapping +, severe decrease in diffusion  CT angio 5/12 asc aortic aneurysm 41mm, hepatic cysts  01/01/11  -about 10 cigs, chantix made her sick Uses albuterol MDI 4-5 /day -not interested in exercise program , compliant with O2 , helpful  >>no changes   05/07/2011 Follow up  Pt returns for follow up . Since last ov doing about the same. Dyspnea is at her baseline with  Flare.  NO ER or hospital visits.  She is unfortunately still smoking. No discolored mucus or fever. Advised of no smoking in home -O2 dangers.  Discussed smoking cesstation .  No cough or congesiton .     Review of Systems  Constitutional:   No  weight loss, night sweats,  Fevers, chills,  +fatigue, or  lassitude.  HEENT:   No headaches,  Difficulty swallowing,  Tooth/dental problems, or  Sore throat,                No sneezing, itching, ear ache, nasal congestion, post nasal drip,   CV:  No chest pain,  Orthopnea, PND, swelling in lower extremities, anasarca, dizziness, palpitations, syncope.   GI  No heartburn, indigestion, abdominal pain, nausea, vomiting, diarrhea, change in bowel habits, loss of appetite,  bloody stools.   Resp:    No coughing up of blood.  No change in color of mucus.  No wheezing.  No chest wall deformity  Skin: no rash or lesions.  GU: no dysuria, change in color of urine, no urgency or frequency.  No flank pain, no hematuria   MS:  No joint pain or swelling.  No decreased range of motion.  No back pain.  Psych:  No change in mood or affect. No depression or anxiety.  No memory loss.          Objective:   Physical Exam   Gen. Pleasant, well-nourished, in no distress ENT - no lesions, no post nasal drip Neck: No JVD, no thyromegaly, no carotid bruits Lungs: no use of accessory muscles, no dullness to percussion, decreased without rales or rhonchi  Cardiovascular: Rhythm regular, heart sounds  normal, no murmurs or gallops, no peripheral edema Musculoskeletal: No deformities, no cyanosis or clubbing        Assessment & Plan:

## 2011-06-05 ENCOUNTER — Encounter: Payer: Self-pay | Admitting: Cardiology

## 2011-06-08 ENCOUNTER — Encounter: Payer: Self-pay | Admitting: Cardiology

## 2011-06-08 ENCOUNTER — Ambulatory Visit (INDEPENDENT_AMBULATORY_CARE_PROVIDER_SITE_OTHER): Payer: Medicare Other | Admitting: Cardiology

## 2011-06-08 DIAGNOSIS — R001 Bradycardia, unspecified: Secondary | ICD-10-CM

## 2011-06-08 DIAGNOSIS — I498 Other specified cardiac arrhythmias: Secondary | ICD-10-CM

## 2011-06-08 DIAGNOSIS — Z720 Tobacco use: Secondary | ICD-10-CM

## 2011-06-08 DIAGNOSIS — R609 Edema, unspecified: Secondary | ICD-10-CM

## 2011-06-08 DIAGNOSIS — I2510 Atherosclerotic heart disease of native coronary artery without angina pectoris: Secondary | ICD-10-CM

## 2011-06-08 DIAGNOSIS — F17200 Nicotine dependence, unspecified, uncomplicated: Secondary | ICD-10-CM

## 2011-06-08 DIAGNOSIS — I779 Disorder of arteries and arterioles, unspecified: Secondary | ICD-10-CM

## 2011-06-08 MED ORDER — LISINOPRIL 10 MG PO TABS: 10.0000 mg | ORAL_TABLET | Freq: Every day | ORAL | Status: DC

## 2011-06-08 MED ORDER — SENNA-DOCUSATE SODIUM 8.6-50 MG PO TABS: 1.0000 | ORAL_TABLET | Freq: Every day | ORAL | Status: DC

## 2011-06-08 MED ORDER — ICAPS PO CAPS: ORAL_CAPSULE | ORAL | Status: DC

## 2011-06-08 MED ORDER — METOPROLOL TARTRATE 100 MG PO TABS: 100.0000 mg | ORAL_TABLET | Freq: Two times a day (BID) | ORAL | Status: DC

## 2011-06-08 NOTE — Assessment & Plan Note (Addendum)
The patient has significant bradycardia. In addition she is on high-dose metoprolol which is not optimal for her pulmonary status. She really does not know exactly what her medicines are. We will call her when she arrives at home today and get a more complete listing of her medicines and then decide about adjusting her meds. //  I have now verified that the patient in fact is taking 200 mg of metoprolol twice a day. She has lung disease and she is bradycardic. This dose will immediately be reduced to 100 mg twice a day.

## 2011-06-08 NOTE — Assessment & Plan Note (Signed)
Coronary disease is stable.  No further workup. 

## 2011-06-08 NOTE — Progress Notes (Signed)
HPI Patient is seen to followup coronary disease and her fluid status. She's not having any chest pain. She is wearing her portable oxygen. He's not having any significant chest pain. Allergies  Allergen Reactions  . Codeine   . Penicillins   . Ranitidine Hcl   . Sulfonamide Derivatives     REACTION: nausea    Current Outpatient Prescriptions  Medication Sig Dispense Refill  . albuterol (PROAIR HFA) 108 (90 BASE) MCG/ACT inhaler Inhale 2 puffs into the lungs every 6 (six) hours as needed.  1 each  2  . aspirin 81 MG tablet Take 81 mg by mouth daily.        . budesonide-formoterol (SYMBICORT) 160-4.5 MCG/ACT inhaler Inhale 2 puffs into the lungs 2 (two) times daily.        Marland Kitchen docusate sodium (COLACE) 100 MG capsule Take 100 mg by mouth 2 (two) times daily.        . ergocalciferol (VITAMIN D2) 50000 UNITS capsule 1 capsule by mouth every other week      . ibuprofen (ADVIL,MOTRIN) 200 MG tablet Take 200 mg by mouth every 8 (eight) hours as needed.        . IPRATROPIUM BROMIDE NA 2 sprays by Nasal route as needed.        Marland Kitchen lisinopril (PRINIVIL,ZESTRIL) 40 MG tablet Take 40 mg by mouth daily.      . metoprolol (LOPRESSOR) 100 MG tablet Take 2 tablets by mouth two times daily       . nicotine (NICOTROL) 10 MG inhaler Inhale 1 puff into the lungs as needed.        . potassium chloride SA (K-DUR,KLOR-CON) 20 MEQ tablet Take 20 mEq by mouth 2 (two) times daily.        Marland Kitchen selenium sulfide (SELSUN) 2.5 % shampoo As directed      . tiotropium (SPIRIVA) 18 MCG inhalation capsule Place 18 mcg into inhaler and inhale daily.        . verapamil (CALAN-SR) 180 MG CR tablet Take 180 mg by mouth 2 (two) times daily.          History   Social History  . Marital Status: Widowed    Spouse Name: N/A    Number of Children: 1  . Years of Education: N/A   Occupational History  . retired     [employer]   Social History Main Topics  . Smoking status: Current Everyday Smoker -- 0.8 packs/day for 59 years   Types: Cigarettes  . Smokeless tobacco: Not on file  . Alcohol Use: No  . Drug Use: Not on file  . Sexually Active: Not on file   Other Topics Concern  . Not on file   Social History Narrative  . No narrative on file    No family history on file.  Past Medical History  Diagnosis Date  . CAD (coronary artery disease)   . Aortic valve sclerosis   . Hypertension   . Hyperlipidemia   . GI bleed     AVMs  . Aneurysm, aortic     thoracic aorta, stable at 4.1 cm, chest CT, May, 2012  . Syncope     Nitroglycerin plus a diuretic, April, 2009  . Hyponatremia   . COPD (chronic obstructive pulmonary disease)   . Tobacco abuse   . Bradycardia   . Hx of CABG     2005  . Ejection fraction     EF 60-65%, echo, July, 2010  . Carotid artery  disease     Doppler, October, 2011, stable, 0-39% RIC A., 40-59% LICA  . Tuberculosis     Exposures to tuberculosis 1970s, has tested negative by the health Department  . Edema     July, 2012    Past Surgical History  Procedure Date  . Cholecystectomy   . Carotid endartercetomy   . Abdominal hysterectomy   . Coronary artery bypass graft 2005    ROS  Patient denies fever, chills, headache, sweats, rash, change in vision, change in hearing, chest pain, cough, nausea vomiting, urinary symptoms. All other systems are reviewed and are negative.  PHYSICAL EXAM Patient is stable today. She smells of her cigarette smoking.She is oriented to person time and place. Affect is normal. There is no jugulovenous distention. She has significant kyphosis of the spine. Please note jugulovenous distention. Lungs are clear. Respiratory effort is nonlabored. Cardiac exam reveals an S1 and S2. There no clicks or significant murmurs. Her abdomen is soft. The patient has trace peripheral edema. There are no skin rashes. Filed Vitals:   06/08/11 1541  BP: 174/82  Pulse: 52  Weight: 175 lb (79.379 kg)   EKG is done today and reviewed by me. The patient has  significant sinus bradycardia. There are nonspecific ST-T wave changes.  ASSESSMENT & PLAN

## 2011-06-08 NOTE — Assessment & Plan Note (Signed)
Unfortunately the patient continues to smoke despite severe lung disease. I have counseled her to stop.

## 2011-06-08 NOTE — Assessment & Plan Note (Signed)
Patient has trace peripheral edema. I suspect it is venous. She has not total body volume overloaded. No change in therapy.

## 2011-06-08 NOTE — Assessment & Plan Note (Signed)
Her carotid Dopplers are being followed carefully. No change in therapy.

## 2011-06-08 NOTE — Progress Notes (Signed)
Addended by: Worthy Rancher D on: 06/08/2011 05:29 PM   Modules accepted: Orders

## 2011-06-08 NOTE — Patient Instructions (Addendum)
Your physician wants you to follow-up in: 6 months.   You will receive a reminder letter in the mail two months in advance. If you don't receive a letter, please call our office to schedule the follow-up appointment.  I will call you in an hour to get a list of all your medications.  Please have the bottles handy when you get home.  Your physician has recommended you make the following change in your medication: decrease your Metoprolol to 100mg  (one tablet) twice daily.  (Called with this information after receiving med list).

## 2011-06-09 NOTE — Progress Notes (Signed)
Pt was called and told to decrease her Metoprolol to 100mg  twice daily per Dr Myrtis Ser.  Pt agreed.

## 2011-07-26 ENCOUNTER — Other Ambulatory Visit: Payer: Self-pay

## 2011-07-26 ENCOUNTER — Emergency Department (HOSPITAL_COMMUNITY): Payer: Medicare Other

## 2011-07-26 ENCOUNTER — Inpatient Hospital Stay (HOSPITAL_COMMUNITY)
Admission: EM | Admit: 2011-07-26 | Discharge: 2011-08-04 | DRG: 308 | Disposition: A | Discharge status: Home Health | Payer: Medicare Other | Source: Ambulatory Visit | Attending: Cardiology | Admitting: Cardiology

## 2011-07-26 ENCOUNTER — Encounter (HOSPITAL_COMMUNITY): Payer: Self-pay | Admitting: Emergency Medicine

## 2011-07-26 DIAGNOSIS — K922 Gastrointestinal hemorrhage, unspecified: Secondary | ICD-10-CM | POA: Insufficient documentation

## 2011-07-26 DIAGNOSIS — E785 Hyperlipidemia, unspecified: Secondary | ICD-10-CM | POA: Diagnosis present

## 2011-07-26 DIAGNOSIS — I5033 Acute on chronic diastolic (congestive) heart failure: Secondary | ICD-10-CM | POA: Diagnosis not present

## 2011-07-26 DIAGNOSIS — I4891 Unspecified atrial fibrillation: Principal | ICD-10-CM

## 2011-07-26 DIAGNOSIS — Z92241 Personal history of systemic steroid therapy: Secondary | ICD-10-CM

## 2011-07-26 DIAGNOSIS — R001 Bradycardia, unspecified: Secondary | ICD-10-CM

## 2011-07-26 DIAGNOSIS — J449 Chronic obstructive pulmonary disease, unspecified: Secondary | ICD-10-CM

## 2011-07-26 DIAGNOSIS — R0902 Hypoxemia: Secondary | ICD-10-CM

## 2011-07-26 DIAGNOSIS — J441 Chronic obstructive pulmonary disease with (acute) exacerbation: Secondary | ICD-10-CM | POA: Diagnosis present

## 2011-07-26 DIAGNOSIS — E876 Hypokalemia: Secondary | ICD-10-CM

## 2011-07-26 DIAGNOSIS — N289 Disorder of kidney and ureter, unspecified: Secondary | ICD-10-CM | POA: Diagnosis not present

## 2011-07-26 DIAGNOSIS — I872 Venous insufficiency (chronic) (peripheral): Secondary | ICD-10-CM | POA: Diagnosis present

## 2011-07-26 DIAGNOSIS — F17200 Nicotine dependence, unspecified, uncomplicated: Secondary | ICD-10-CM | POA: Diagnosis present

## 2011-07-26 DIAGNOSIS — Z7982 Long term (current) use of aspirin: Secondary | ICD-10-CM

## 2011-07-26 DIAGNOSIS — I10 Essential (primary) hypertension: Secondary | ICD-10-CM

## 2011-07-26 DIAGNOSIS — I359 Nonrheumatic aortic valve disorder, unspecified: Secondary | ICD-10-CM | POA: Diagnosis present

## 2011-07-26 DIAGNOSIS — Z79899 Other long term (current) drug therapy: Secondary | ICD-10-CM

## 2011-07-26 DIAGNOSIS — R0602 Shortness of breath: Secondary | ICD-10-CM

## 2011-07-26 DIAGNOSIS — J811 Chronic pulmonary edema: Secondary | ICD-10-CM

## 2011-07-26 DIAGNOSIS — I509 Heart failure, unspecified: Secondary | ICD-10-CM | POA: Diagnosis not present

## 2011-07-26 DIAGNOSIS — R609 Edema, unspecified: Secondary | ICD-10-CM

## 2011-07-26 DIAGNOSIS — Z9981 Dependence on supplemental oxygen: Secondary | ICD-10-CM

## 2011-07-26 DIAGNOSIS — I2510 Atherosclerotic heart disease of native coronary artery without angina pectoris: Secondary | ICD-10-CM

## 2011-07-26 DIAGNOSIS — E871 Hypo-osmolality and hyponatremia: Secondary | ICD-10-CM

## 2011-07-26 DIAGNOSIS — Z951 Presence of aortocoronary bypass graft: Secondary | ICD-10-CM

## 2011-07-26 DIAGNOSIS — I358 Other nonrheumatic aortic valve disorders: Secondary | ICD-10-CM

## 2011-07-26 DIAGNOSIS — Z720 Tobacco use: Secondary | ICD-10-CM

## 2011-07-26 DIAGNOSIS — J9610 Chronic respiratory failure, unspecified whether with hypoxia or hypercapnia: Secondary | ICD-10-CM

## 2011-07-26 DIAGNOSIS — D72829 Elevated white blood cell count, unspecified: Secondary | ICD-10-CM | POA: Diagnosis present

## 2011-07-26 DIAGNOSIS — E236 Other disorders of pituitary gland: Secondary | ICD-10-CM | POA: Diagnosis present

## 2011-07-26 DIAGNOSIS — I779 Disorder of arteries and arterioles, unspecified: Secondary | ICD-10-CM

## 2011-07-26 HISTORY — DX: Other specified disorders of veins: I87.8

## 2011-07-26 LAB — CBC
HCT: 39.1 % (ref 36.0–46.0)
Hemoglobin: 13.6 g/dL (ref 12.0–15.0)
MCH: 30.3 pg (ref 26.0–34.0)
MCHC: 34.8 g/dL (ref 30.0–36.0)
MCV: 87.1 fL (ref 78.0–100.0)

## 2011-07-26 LAB — DIFFERENTIAL
Basophils Relative: 0 % (ref 0–1)
Eosinophils Relative: 0 % (ref 0–5)
Monocytes Absolute: 1.4 10*3/uL — ABNORMAL HIGH (ref 0.1–1.0)
Monocytes Relative: 10 % (ref 3–12)
Neutro Abs: 11.3 10*3/uL — ABNORMAL HIGH (ref 1.7–7.7)

## 2011-07-26 LAB — BASIC METABOLIC PANEL
BUN: 7 mg/dL (ref 6–23)
Chloride: 87 mEq/L — ABNORMAL LOW (ref 96–112)
Creatinine, Ser: 0.55 mg/dL (ref 0.50–1.10)
GFR calc Af Amer: >90 mL/min (ref >90)

## 2011-07-26 MED ORDER — DILTIAZEM HCL 50 MG/10ML IV SOLN: 20.0000 mg | Freq: Once | INTRAVENOUS | Status: DC

## 2011-07-26 MED ORDER — DILTIAZEM HCL 25 MG/5ML IV SOLN
20.0000 mg | Freq: Once | INTRAVENOUS | Status: AC
  Administered 2011-07-26: 20 mg via INTRAVENOUS

## 2011-07-26 MED ORDER — GI COCKTAIL ~~LOC~~
30.0000 mL | Freq: Once | ORAL | Status: AC
  Administered 2011-07-26: 30 mL via ORAL
  Filled 2011-07-26: qty 30

## 2011-07-26 MED ORDER — DILTIAZEM HCL 100 MG IV SOLR
10.0000 mg/h | INTRAVENOUS | Status: DC
  Administered 2011-07-26: 10 mg/h via INTRAVENOUS
  Administered 2011-07-26: 15 mg/h via INTRAVENOUS
  Filled 2011-07-26: qty 100

## 2011-07-26 MED ORDER — DILTIAZEM HCL 25 MG/5ML IV SOLN
20.0000 mg | Freq: Once | INTRAVENOUS | Status: AC
  Administered 2011-07-26: 25 mg via INTRAVENOUS

## 2011-07-26 MED ORDER — DILTIAZEM HCL 25 MG/5ML IV SOLN
INTRAVENOUS | Status: AC
  Administered 2011-07-26: 20 mg
  Filled 2011-07-26: qty 5

## 2011-07-26 MED ORDER — DILTIAZEM HCL 25 MG/5ML IV SOLN
INTRAVENOUS | Status: AC
  Filled 2011-07-26: qty 5

## 2011-07-26 NOTE — H&P (Signed)
Primary Cardiologist: Dr. Myrtis Ser Corinda Gubler)   Patient Location: initially evaluated in the [facility] ED  Chief Complaint: shortness of breath, cough, sore throat  HPI:  [name] is a 78 year old woman with a history of coronary disease requiring surgical revascularization in 2005, extensive tobacco use resulting in oxygen-dependent COPD, hyperlipidemia, hypertension, and a remote history of GI bleeding from presumed AVMs who presents to the [facility] Emergency Department with 3-4 days of progressive shortness of breath, productive cough, and palpitations.   She says she last felt well approximately 5 days ago. While denying any sick contacts, she developed a cough which became more and more persistent with production of greenish sputum. She has also felt more short of breath during this time, with occasional palpitations. She denies fevers, but does she she has experienced sweats during this time. No nausea or vomiting, but her appetite has been poor for the past 2-3 days. No abdominal pain or diarrhea. She denies chest pain or chest tightness. No PND, orthopnea, or leg swelling. No lightheadedness or syncope.  On the basis of her symptoms, she presentned to the emergency department. She was found to be in atrial fibrillation with a rapid ventricular response (150s). Given the degree of her RVR, the [facility] team preferred that we primarily admit her to the inpatient cardiology service.   Past Medical History:  CAD  - s/p CABG x5 (2005) - TTE 10/2008: EF 60-65% Aortic sclerosis Hypertension Hyperlipdemia GI bleed due to AVMs Hyponatremia COPD on home O2 Bradycardia PVD - s/p carotid endarterectomy Chronic lower extremity venous stasis S/p cholecystectomy S/p abdominal hysterectomy  Family History  Non-contributory to this presentation  Social History  Cigarette smoker (active)  ROS: as per HPI; also complains of episodic leg cramps otherwise is comprehensively  negative   Allergies: Codeine, PCN, Sulfa, Ranitidine  Medications (home):  Aspirin 81mg  Verapamil (Calan SR) 180mg  BID Tiotropium daily Budesonside-formoterol (Symbicort) Inhaler (160/4.5) 2 puffs BID KCl BID  Exam  Afebrile  HR 120 BP 131/78 RR 20-22 97% on 2L [place]  GEN mild distress due to respiratory status; mild use of accessory muscles NECK supple, JVP is not appreciably elevated CHEST distant breath sounds with occasional wheezing; mid-line sternal scar CARDIAC irregular, S1 S2 ABDOMEN soft, non-distended, non-tender EXT warm, no edema present  NEURO alert & oriented  SKIN warm & dry   Labs  Trop 0.03 Pro-BNP   Na/K 125/3.4 Cl/CO2  87/26 BUN/Cr  7/0.55 WBC  13.8 (82% Neutrophils) Hgb 13.6 Plts 248K Urinalysis:   Imaging & Testing  CXR: chronic emphysematous changes; no evident acute infiltrate or edema ECG: atrial fibrillation with RVR  Assessment/Plan  This is a 78 year old woman with coronary disease and severe COPD with ongoing tobacco use who presents with atrial fibrillation with RVR. Her Afib with RVR is likely being driven by an upper respiratory infection (bronchitis) in the setting of her chronic lung disease. Controlling her RVR will be a function of primarily addressing her COPD exacerbation, while also employing rate-control agents to keep her out of profound RVR.   1) COPD exacerbation: upper respiratory infection/bronchitis in the setting of oxygen-dependent COPD - Azithromycin 500mg  dose now, then 250mg  daily for 5 days - Inhaled corticosteroids/bronchodilators per her home regimen, together with daily Tiotropium - PRN nebulizer therapy depending on her respiratory comfort - Short course of Prednisone 40 mg PO Daily for 5 days  2) Atrial fibrillation with RVR - Addressing underlying triggers/driver as discussed above -  Continued use of home Metoprolol dose (100mg  BID) - Continue IV Diltiazem infusion with adjustments as necessary to  keep her out of profound RVR. As a result, I am currently holding her Verapamil. - IV Heparin for thrombo-embolic prophylaxis in the setting of Atrial Fibrillation given CHADS2Vasc score of 4 - Given her history of GI AVM's, it would be preferable to restore sinus rhythm once she is through the acute component of her illness, and potentially try to not commit her to long term anticoagulation. If she does not spontaneously convert over the next few days, cardioversion would be an option. Given that her symptoms were going on for 4 days at home prior to admission, I think she would need a TEE before cardioversion to exclude left atrial appendage thrombus.   3) Tobacco use - I reinforced the mandatory need for her to remove her oxygen when she decides to smoke at home. It would of course be preferable if she decided not to smoke at all, but I'm not sure she is interested in this course of action.   My impressions and the plan were explained to the patient. Her questions were answered to the best of my ability.   Zacarias Pontes, MD  Cardiology Fellow On-Call  [phone]

## 2011-07-26 NOTE — ED Notes (Signed)
Pt reports chest heaviness onset thursday past with SOB hx smoker and COPD

## 2011-07-26 NOTE — ED Notes (Signed)
Daughter-in-law, Janylah Belgrave, [phone]

## 2011-07-26 NOTE — ED Provider Notes (Addendum)
History     CSN: [id number]  Arrival date & time 07/26/11  4098   First MD Initiated Contact with Patient 07/26/11 1939      Chief Complaint  Patient presents with  . Chest Pain  . Shortness of Breath    (Consider location/radiation/quality/duration/timing/severity/associated sxs/prior treatment) Patient is a 78 y.o. female presenting with chest pain and shortness of breath. The history is provided by the patient. The history is limited by the condition of the patient.  Chest Pain Primary symptoms include shortness of breath and cough. Pertinent negatives for primary symptoms include no fever, no abdominal pain, no nausea and no vomiting.    Shortness of Breath  Associated symptoms include chest pain, cough and shortness of breath. Pertinent negatives include no fever.   the patient is a 78 year old, female, with history of coronary artery disease, CABG, COPD, and hypertension, who presents to emergency department complaining of sore throat, productive cough, chest pressure, and shortness of breath.  For approximately 4 days.  She denies nausea, vomiting, fevers, chills, leg pain, swelling.  She has not had any urinary tract symptoms.  She is not taking any anticoagulants.  She denies a history of atrial fibrillation.  She continues to smoke cigarettes.  She does not use oxygen at home.  Level V caveat applies for urgent need for intervention, do to atrial fibrillation with rapid ventricular response  Past Medical History  Diagnosis Date  . CAD (coronary artery disease)   . Aortic valve sclerosis   . Hypertension   . Hyperlipidemia   . GI bleed     AVMs  . Aneurysm, aortic     thoracic aorta, stable at 4.1 cm, chest CT, May, 2012  . Syncope     Nitroglycerin plus a diuretic, April, 2009  . Hyponatremia   . COPD (chronic obstructive pulmonary disease)   . Tobacco abuse   . Bradycardia   . Hx of CABG     2005  . Ejection fraction     EF 60-65%, echo, July, 2010  . Carotid  artery disease     Doppler, October, 2011, stable, 0-39% RIC A., 40-59% LICA  . Tuberculosis     Exposures to tuberculosis 1970s, has tested negative by the health Department  . Edema     July, 2012    Past Surgical History  Procedure Date  . Cholecystectomy   . Carotid endartercetomy   . Abdominal hysterectomy   . Coronary artery bypass graft 2005    History reviewed. No pertinent family history.  History  Substance Use Topics  . Smoking status: Current Everyday Smoker -- 0.8 packs/day for 59 years    Types: Cigarettes  . Smokeless tobacco: Not on file  . Alcohol Use: No    OB History    Grav Para Term Preterm Abortions TAB SAB Ect Mult Living                  Review of Systems  Constitutional: Negative for fever and chills.  Respiratory: Positive for cough and shortness of breath.   Cardiovascular: Positive for chest pain.  Gastrointestinal: Negative for nausea, vomiting, abdominal pain and diarrhea.  Genitourinary: Negative for dysuria.  Neurological: Negative for light-headedness.  All other systems reviewed and are negative.    Allergies  Codeine; Penicillins; Ranitidine hcl; and Sulfonamide derivatives  Home Medications   Current Outpatient Rx  Name Route Sig Dispense Refill  . ALBUTEROL SULFATE HFA 108 (90 BASE) MCG/ACT IN AERS Inhalation Inhale 2  puffs into the lungs every 6 (six) hours as needed. For shortness of breath    . ASPIRIN 81 MG PO TABS Oral Take 81 mg by mouth daily.      . BUDESONIDE-FORMOTEROL FUMARATE 160-4.5 MCG/ACT IN AERO Inhalation Inhale 2 puffs into the lungs 2 (two) times daily.      Marland Kitchen DOCUSATE SODIUM 100 MG PO CAPS Oral Take 100 mg by mouth 2 (two) times daily.      . ERGOCALCIFEROL 50000 UNITS PO CAPS  1 capsule by mouth every other week    . IBUPROFEN 200 MG PO TABS Oral Take 200 mg by mouth every 8 (eight) hours as needed.      . IPRATROPIUM BROMIDE NA Nasal 2 sprays by Nasal route as needed.      Marland Kitchen LISINOPRIL 10 MG PO TABS  Oral Take 10 mg by mouth daily.    Marland Kitchen METOPROLOL TARTRATE 100 MG PO TABS Oral Take 100 mg by mouth 2 (two) times daily.    Marland Kitchen NICOTINE 10 MG IN INHA Inhalation Inhale 1 puff into the lungs as needed.      Marland Kitchen POTASSIUM CHLORIDE CRYS ER 20 MEQ PO TBCR Oral Take 20 mEq by mouth 2 (two) times daily.      . SELENIUM SULFIDE 2.5 % EX LOTN  As directed    . SENNA-DOCUSATE SODIUM 8.6-50 MG PO TABS Oral Take 1 tablet by mouth daily.    Marland Kitchen TIOTROPIUM BROMIDE MONOHYDRATE 18 MCG IN CAPS Inhalation Place 18 mcg into inhaler and inhale daily.      Marland Kitchen VERAPAMIL HCL 180 MG PO TBCR Oral Take 180 mg by mouth 2 (two) times daily.        BP 154/75  Pulse 105  Temp(Src) 98.2 F (36.8 C) (Oral)  Resp 19  SpO2 97%  Physical Exam  Vitals reviewed. Constitutional: She is oriented to person, place, and time. She appears well-developed and well-nourished.       Anxious  HENT:  Head: Normocephalic and atraumatic.  Eyes: Conjunctivae are normal. Pupils are equal, round, and reactive to light.  Neck: Normal range of motion. Neck supple.  Cardiovascular:  No murmur heard.      Tachycardia irregular  Pulmonary/Chest: She is in respiratory distress. She has no wheezes. She has no rales.       Shallow respirations  Abdominal: Soft. She exhibits no distension. There is no tenderness.  Musculoskeletal: Normal range of motion. She exhibits no edema.  Neurological: She is alert and oriented to person, place, and time. No cranial nerve deficit.       Anxious  Skin: Skin is warm and dry.  Psychiatric: Judgment and thought content normal.       Anxious    ED Course  Procedures (including critical care time) New onset A. fib with rapid ventricular response in patient with chest pain, shortness breath. She continues to smoke.  We will give her diltiazem to slow down.  Her heart beat.  I will do a chest x-ray, and laboratory testing, to look for cardiac ischemia.  Labs Reviewed  CBC - Abnormal; Notable for the following:     WBC 13.8 (*)    All other components within normal limits  DIFFERENTIAL - Abnormal; Notable for the following:    Neutrophils Relative 82 (*)    Neutro Abs 11.3 (*)    Lymphocytes Relative 7 (*)    Monocytes Absolute 1.4 (*)    All other components within normal limits  BASIC  METABOLIC PANEL - Abnormal; Notable for the following:    Sodium 125 (*)    Potassium 3.4 (*)    Chloride 87 (*)    Glucose, Bld 164 (*)    GFR calc non Af Amer 87 (*)    All other components within normal limits  POCT I-STAT TROPONIN I   Dg Chest Port 1 View  07/26/2011  *RADIOLOGY REPORT*  Clinical Data: Chest pain and shortness of breath.  PORTABLE CHEST - 1 VIEW  Comparison: 09/01/2010.  Findings: The cardiac silhouette, mediastinal and hilar contours are within normal limits and stable.  Stable surgical changes from bypass surgery.  Stable emphysematous changes and pulmonary scarring.  No definite acute overlying pulmonary process.  IMPRESSION: Chronic emphysematous changes and pulmonary scarring.  No definite acute overlying pulmonary process.  Original Report Authenticated By: P. Loralie Champagne, M.D.     No diagnosis found.  9:20 PM Spoke with [facility]. He asked me to call cards since it has taken 3 doses of dilt to control rate. Spoke with cards.  He will come admit.  CRITICAL CARE Performed by: Nicholes Stairs   Total critical care time: 30 min  Critical care time was exclusive of separately billable procedures and treating other patients.  Critical care was necessary to treat or prevent imminent or life-threatening deterioration.  Critical care was time spent personally by me on the following activities: development of treatment plan with patient and/or surrogate as well as nursing, discussions with consultants, evaluation of patient's response to treatment, examination of patient, obtaining history from patient or surrogate, ordering and performing treatments and interventions, ordering and  review of laboratory studies, ordering and review of radiographic studies, pulse oximetry and re-evaluation of patient's condition.  ED ECG REPORT   Date: 07/26/2011  EKG Time: 10:13 PM  Rate: 148  Rhythm: atrial fibrillation,  New afib  Axis: left  Intervals:none  ST&T Change: nonspecific st changes  Narrative Interpretation: afib with rvr  ED ECG REPORT   Date: 07/26/2011  EKG Time: 10:14 PM  Rate: 96  Rhythm: atrial fibrillation,  Rate decreased after dilt tx in ed  Axis: left  Intervals:none  ST&T Change: none  Narrative Interpretation: afib                      MDM  New onset atrial fibrillation Hyponatremia        Cheri Guppy, MD 07/26/11 2121  Cheri Guppy, MD 07/26/11 2215

## 2011-07-27 ENCOUNTER — Other Ambulatory Visit: Payer: Self-pay

## 2011-07-27 ENCOUNTER — Encounter (HOSPITAL_COMMUNITY): Payer: Self-pay | Admitting: Cardiology

## 2011-07-27 DIAGNOSIS — R609 Edema, unspecified: Secondary | ICD-10-CM

## 2011-07-27 DIAGNOSIS — J449 Chronic obstructive pulmonary disease, unspecified: Secondary | ICD-10-CM

## 2011-07-27 DIAGNOSIS — R0902 Hypoxemia: Secondary | ICD-10-CM

## 2011-07-27 DIAGNOSIS — I4891 Unspecified atrial fibrillation: Principal | ICD-10-CM

## 2011-07-27 LAB — BASIC METABOLIC PANEL
BUN: 7 mg/dL (ref 6–23)
Chloride: 89 mEq/L — ABNORMAL LOW (ref 96–112)
Glucose, Bld: 139 mg/dL — ABNORMAL HIGH (ref 70–99)
Potassium: 3.7 mEq/L (ref 3.5–5.1)

## 2011-07-27 LAB — URINE MICROSCOPIC-ADD ON

## 2011-07-27 LAB — HEPARIN LEVEL (UNFRACTIONATED)
Heparin Unfractionated: 0.14 IU/mL — ABNORMAL LOW (ref 0.30–0.70)
Heparin Unfractionated: 0.19 IU/mL — ABNORMAL LOW (ref 0.30–0.70)

## 2011-07-27 LAB — GLUCOSE, CAPILLARY: Glucose-Capillary: 150 mg/dL — ABNORMAL HIGH (ref 70–99)

## 2011-07-27 LAB — PRO B NATRIURETIC PEPTIDE: Pro B Natriuretic peptide (BNP): 1920 pg/mL — ABNORMAL HIGH (ref 0–450)

## 2011-07-27 LAB — URINALYSIS, ROUTINE W REFLEX MICROSCOPIC
Glucose, UA: NEGATIVE mg/dL
Hgb urine dipstick: NEGATIVE
Specific Gravity, Urine: 1.013 (ref 1.005–1.030)

## 2011-07-27 MED ORDER — POTASSIUM CHLORIDE CRYS ER 20 MEQ PO TBCR
20.0000 meq | EXTENDED_RELEASE_TABLET | Freq: Two times a day (BID) | ORAL | Status: DC
  Administered 2011-07-27 – 2011-08-04 (×17): 20 meq via ORAL
  Filled 2011-07-27 (×22): qty 1

## 2011-07-27 MED ORDER — BUDESONIDE-FORMOTEROL FUMARATE 160-4.5 MCG/ACT IN AERO
2.0000 | INHALATION_SPRAY | Freq: Two times a day (BID) | RESPIRATORY_TRACT | Status: DC
  Administered 2011-07-27 – 2011-08-04 (×17): 2 via RESPIRATORY_TRACT
  Filled 2011-07-27: qty 6

## 2011-07-27 MED ORDER — LISINOPRIL 10 MG PO TABS
10.0000 mg | ORAL_TABLET | Freq: Every day | ORAL | Status: DC
  Administered 2011-07-27 – 2011-07-28 (×2): 10 mg via ORAL
  Filled 2011-07-27 (×2): qty 1

## 2011-07-27 MED ORDER — SODIUM CHLORIDE 0.9 % IV SOLN
INTRAVENOUS | Status: AC
Start: 2011-07-27 — End: 2011-07-27
  Administered 2011-07-27 (×2): via INTRAVENOUS

## 2011-07-27 MED ORDER — HEPARIN BOLUS VIA INFUSION
1000.0000 [IU] | Freq: Once | INTRAVENOUS | Status: AC
  Administered 2011-07-27: 1000 [IU] via INTRAVENOUS
  Filled 2011-07-27: qty 1000

## 2011-07-27 MED ORDER — FUROSEMIDE 40 MG PO TABS
40.0000 mg | ORAL_TABLET | Freq: Two times a day (BID) | ORAL | Status: DC
  Administered 2011-07-27: 40 mg via ORAL
  Filled 2011-07-27: qty 1

## 2011-07-27 MED ORDER — PANTOPRAZOLE SODIUM 40 MG PO TBEC
40.0000 mg | DELAYED_RELEASE_TABLET | Freq: Every day | ORAL | Status: DC
  Administered 2011-07-27 – 2011-08-04 (×9): 40 mg via ORAL
  Filled 2011-07-27 (×9): qty 1

## 2011-07-27 MED ORDER — HEPARIN (PORCINE) IN NACL 100-0.45 UNIT/ML-% IJ SOLN
1300.0000 [IU]/h | INTRAMUSCULAR | Status: DC
  Administered 2011-07-27: 1300 [IU]/h via INTRAVENOUS
  Administered 2011-07-27: 1000 [IU]/h via INTRAVENOUS
  Administered 2011-07-27 (×2): 1150 [IU]/h via INTRAVENOUS
  Filled 2011-07-27 (×3): qty 250

## 2011-07-27 MED ORDER — FUROSEMIDE 40 MG PO TABS
40.0000 mg | ORAL_TABLET | Freq: Two times a day (BID) | ORAL | Status: DC
  Administered 2011-07-27 – 2011-07-30 (×6): 40 mg via ORAL
  Filled 2011-07-27 (×8): qty 1

## 2011-07-27 MED ORDER — DOCUSATE SODIUM 100 MG PO CAPS
100.0000 mg | ORAL_CAPSULE | Freq: Two times a day (BID) | ORAL | Status: DC
  Administered 2011-07-27 – 2011-08-04 (×17): 100 mg via ORAL
  Filled 2011-07-27 (×18): qty 1

## 2011-07-27 MED ORDER — TIOTROPIUM BROMIDE MONOHYDRATE 18 MCG IN CAPS
18.0000 ug | ORAL_CAPSULE | Freq: Every day | RESPIRATORY_TRACT | Status: DC
  Administered 2011-07-27 – 2011-08-04 (×8): 18 ug via RESPIRATORY_TRACT
  Filled 2011-07-27 (×2): qty 5

## 2011-07-27 MED ORDER — SENNOSIDES-DOCUSATE SODIUM 8.6-50 MG PO TABS
1.0000 | ORAL_TABLET | Freq: Every day | ORAL | Status: DC
  Administered 2011-07-27 – 2011-08-04 (×9): 1 via ORAL
  Filled 2011-07-27 (×9): qty 1

## 2011-07-27 MED ORDER — ASPIRIN EC 81 MG PO TBEC
81.0000 mg | DELAYED_RELEASE_TABLET | Freq: Every day | ORAL | Status: DC
  Administered 2011-07-28 – 2011-08-04 (×8): 81 mg via ORAL
  Filled 2011-07-27 (×8): qty 1

## 2011-07-27 MED ORDER — METOPROLOL TARTRATE 100 MG PO TABS
100.0000 mg | ORAL_TABLET | Freq: Two times a day (BID) | ORAL | Status: DC
  Administered 2011-07-27 – 2011-08-01 (×12): 100 mg via ORAL
  Filled 2011-07-27 (×13): qty 1

## 2011-07-27 MED ORDER — ASPIRIN 300 MG RE SUPP
300.0000 mg | RECTAL | Status: AC
  Filled 2011-07-27: qty 1

## 2011-07-27 MED ORDER — GUAIFENESIN ER 600 MG PO TB12
600.0000 mg | ORAL_TABLET | Freq: Two times a day (BID) | ORAL | Status: DC
  Administered 2011-07-27 – 2011-08-04 (×17): 600 mg via ORAL
  Filled 2011-07-27 (×18): qty 1

## 2011-07-27 MED ORDER — HEPARIN BOLUS VIA INFUSION
4000.0000 [IU] | Freq: Once | INTRAVENOUS | Status: AC
  Administered 2011-07-27: 4000 [IU] via INTRAVENOUS
  Filled 2011-07-27: qty 4000

## 2011-07-27 MED ORDER — ACETAMINOPHEN 325 MG PO TABS
650.0000 mg | ORAL_TABLET | ORAL | Status: DC | PRN
  Administered 2011-07-28 – 2011-07-29 (×2): 650 mg via ORAL
  Administered 2011-07-30: 325 mg via ORAL
  Administered 2011-08-03: 650 mg via ORAL
  Filled 2011-07-27 (×2): qty 2
  Filled 2011-07-27: qty 1
  Filled 2011-07-27: qty 2

## 2011-07-27 MED ORDER — AZITHROMYCIN 500 MG PO TABS
500.0000 mg | ORAL_TABLET | Freq: Every day | ORAL | Status: AC
  Administered 2011-07-27: 500 mg via ORAL
  Filled 2011-07-27: qty 1

## 2011-07-27 MED ORDER — AZITHROMYCIN 250 MG PO TABS
250.0000 mg | ORAL_TABLET | Freq: Every day | ORAL | Status: AC
  Administered 2011-07-28 – 2011-07-31 (×4): 250 mg via ORAL
  Filled 2011-07-27 (×4): qty 1

## 2011-07-27 MED ORDER — ASPIRIN 81 MG PO CHEW
324.0000 mg | CHEWABLE_TABLET | ORAL | Status: AC
  Administered 2011-07-27: 324 mg via ORAL
  Filled 2011-07-27: qty 4

## 2011-07-27 MED ORDER — DILTIAZEM HCL 100 MG IV SOLR
5.0000 mg/h | INTRAVENOUS | Status: DC
  Administered 2011-07-27: 15 mg/h via INTRAVENOUS
  Filled 2011-07-27: qty 100

## 2011-07-27 MED ORDER — ALBUTEROL SULFATE HFA 108 (90 BASE) MCG/ACT IN AERS
2.0000 | INHALATION_SPRAY | Freq: Four times a day (QID) | RESPIRATORY_TRACT | Status: DC | PRN
  Administered 2011-07-27 – 2011-07-28 (×5): 2 via RESPIRATORY_TRACT
  Filled 2011-07-27: qty 6.7

## 2011-07-27 MED ORDER — ONDANSETRON HCL 4 MG/2ML IJ SOLN: 4.0000 mg | Freq: Four times a day (QID) | INTRAMUSCULAR | Status: DC | PRN

## 2011-07-27 MED ORDER — NITROGLYCERIN 0.4 MG SL SUBL: 0.4000 mg | SUBLINGUAL_TABLET | SUBLINGUAL | Status: DC | PRN

## 2011-07-27 MED ORDER — PREDNISONE 20 MG PO TABS
50.0000 mg | ORAL_TABLET | Freq: Every day | ORAL | Status: AC
  Administered 2011-07-27 – 2011-07-31 (×5): 50 mg via ORAL
  Filled 2011-07-27 (×6): qty 2.5

## 2011-07-27 NOTE — Plan of Care (Signed)
Problem: Phase I Progression Outcomes Goal: O2 sats > or equal 90% or at baseline Outcome: Completed/Met Date Met:  07/27/11 Pt is on continuous O2 at home at 2L via [place]; home O2 tank that pt brought to hospital  is empty

## 2011-07-27 NOTE — ED Notes (Signed)
Attempted to give report to [place], [place].  Will call back in 15 minutes.

## 2011-07-27 NOTE — Progress Notes (Signed)
UR Completed. Simmons, Kina Shiffman F [phone]  

## 2011-07-27 NOTE — Progress Notes (Signed)
ANTICOAGULATION CONSULT NOTE: Heparin for a.fib  Heparin level = 0.19 on 1150 units/hr Goal heparin level = 0.3-0.7  Heparin level is subtherapeutic. Will increase heparin drip to 1300 units/hr. Will get heparin level in 8 hrs.  Cardell Peach, PharmD

## 2011-07-27 NOTE — Consult Note (Signed)
Name: [name] MRN: [id number] DOB: 11-Apr-1930    LOS: 1  PCCM CONSULT NOTE  History of Present Illness: The patient is a 78 yo female with O2 dependant COPD admitted on 07/26/11 for SOB, she was also found to have atrial fib that was new for her. Since that time she's converted to sinus rhythm. However her pulmonary status remains unstable. She got up to go to the bathroom on 4/1 and on her way back to bed she experienced severe shortness of breath. Her O2 sat was >91% at that time with HR in 70's. Patient reports feeling sick for several days with productive cough and chills at home.   Lines / Drains: None  Cultures: None  Antibiotics: Azithromycin 3/31 >>   Past Medical History  Diagnosis Date  . CAD (coronary artery disease)   . Aortic valve sclerosis   . Hypertension   . Hyperlipidemia   . GI bleed     AVMs  . Aneurysm, aortic     thoracic aorta, stable at 4.1 cm, chest CT, May, 2012  . Syncope     Nitroglycerin plus a diuretic, April, 2009  . Hyponatremia   . COPD (chronic obstructive pulmonary disease)   . Tobacco abuse   . Bradycardia   . Hx of CABG     2005  . Ejection fraction     EF 60-65%, echo, July, 2010  . Carotid artery disease     Doppler, October, 2011, stable, 0-39% RIC A., 40-59% LICA  . Tuberculosis     Exposures to tuberculosis 1970s, has tested negative by the health Department  . Edema     July, 2012  . Atrial fibrillation     New diagnosis July 27, 2011 with COPD exacerbation   Past Surgical History  Procedure Date  . Cholecystectomy   . Carotid endartercetomy   . Abdominal hysterectomy   . Coronary artery bypass graft 2005   Prior to Admission medications   Medication Sig Start Date End Date Taking? Authorizing Provider  albuterol (PROVENTIL HFA;VENTOLIN HFA) 108 (90 BASE) MCG/ACT inhaler Inhale 2 puffs into the lungs every 6 (six) hours as needed. For shortness of breath   Yes Luis Abed, MD  aspirin 81 MG tablet Take 81 mg  by mouth daily.     Yes Historical Provider, MD  budesonide-formoterol (SYMBICORT) 160-4.5 MCG/ACT inhaler Inhale 2 puffs into the lungs 2 (two) times daily.     Yes Historical Provider, MD  docusate sodium (COLACE) 100 MG capsule Take 100 mg by mouth 2 (two) times daily.     Yes Historical Provider, MD  ergocalciferol (VITAMIN D2) 50000 UNITS capsule 1 capsule by mouth every other week   Yes Historical Provider, MD  ibuprofen (ADVIL,MOTRIN) 200 MG tablet Take 200 mg by mouth every 8 (eight) hours as needed.     Yes Historical Provider, MD  IPRATROPIUM BROMIDE NA 2 sprays by Nasal route as needed.     Yes Historical Provider, MD  lisinopril (PRINIVIL,ZESTRIL) 10 MG tablet Take 10 mg by mouth daily.   Yes Luis Abed, MD  metoprolol (LOPRESSOR) 100 MG tablet Take 100 mg by mouth 2 (two) times daily.   Yes Luis Abed, MD  nicotine (NICOTROL) 10 MG inhaler Inhale 1 puff into the lungs as needed.     Yes Historical Provider, MD  potassium chloride SA (K-DUR,KLOR-CON) 20 MEQ tablet Take 20 mEq by mouth 2 (two) times daily.     Yes Historical Provider,  MD  selenium sulfide (SELSUN) 2.5 % shampoo As directed 05/02/11  Yes Historical Provider, MD  sennosides-docusate sodium (SENOKOT-S) 8.6-50 MG tablet Take 1 tablet by mouth daily.   Yes Luis Abed, MD  tiotropium (SPIRIVA) 18 MCG inhalation capsule Place 18 mcg into inhaler and inhale daily.     Yes Historical Provider, MD  verapamil (CALAN-SR) 180 MG CR tablet Take 180 mg by mouth 2 (two) times daily.     Yes Historical Provider, MD    Allergies Allergies  Allergen Reactions  . Codeine   . Penicillins   . Ranitidine Hcl   . Sulfonamide Derivatives     REACTION: nausea    Family History History reviewed. No pertinent family history.  Social History  reports that she has been smoking Cigarettes.  She has a 47.2 pack-year smoking history. She does not have any smokeless tobacco history on file. She reports that she does not drink  alcohol. Her drug history not on file.  Review Of Systems  11 points review of systems is negative with an exception of listed in HPI.  Vital Signs: Filed Vitals:   07/27/11 0800  BP: 169/64  Pulse: 75  Temp:   Resp: 21    Intake/Output Summary (Last 24 hours) at 07/27/11 1159 Last data filed at 07/27/11 1100  Gross per 24 hour  Intake   1095 ml  Output   1500 ml  Net   -405 ml    Physical Examination: General:  alert, well-developed, and cooperative to examination.   Head:  normocephalic and atraumatic.   Lungs:  normal respiratory effort, no accessory muscle use, diffuse inspiratory rhonchi, prolonged expiratory phase with mild expiratory wheezing Heart:  normal rate, regular rhythm, no murmur, no gallop, and no rub.   Abdomen:  soft, non-tender, normal bowel sounds, no distention, no guarding, no rebound tenderness, no hepatomegaly, and no splenomegaly.   Pulses:  2+ DP/PT pulses bilaterally Extremities:  No cyanosis, clubbing, edema Neurologic:  alert & oriented X3,  Skin:  turgor normal and no rashes.     Labs and Imaging:   Labs: CBC:    Component Value Date/Time   WBC 13.8* 07/26/2011 1946   HGB 13.6 07/26/2011 1946   HCT 39.1 07/26/2011 1946   PLT 248 07/26/2011 1946   MCV 87.1 07/26/2011 1946   NEUTROABS 11.3* 07/26/2011 1946   LYMPHSABS 1.0 07/26/2011 1946   MONOABS 1.4* 07/26/2011 1946   EOSABS 0.1 07/26/2011 1946   BASOSABS 0.0 07/26/2011 1946     Basic Metabolic Panel:    Component Value Date/Time   NA 125* 07/27/2011 0313   K 3.7 07/27/2011 0313   CL 89* 07/27/2011 0313   CO2 28 07/27/2011 0313   BUN 7 07/27/2011 0313   CREATININE 0.48* 07/27/2011 0313   GLUCOSE 139* 07/27/2011 0313   CALCIUM 9.5 07/27/2011 0313   Comprehensive Metabolic Panel:    Component Value Date/Time   NA 125* 07/27/2011 0313   K 3.7 07/27/2011 0313   CL 89* 07/27/2011 0313   CO2 28 07/27/2011 0313   BUN 7 07/27/2011 0313   CREATININE 0.48* 07/27/2011 0313   GLUCOSE 139* 07/27/2011 0313   CALCIUM  9.5 07/27/2011 0313   AST 18 08/20/2007 1405   ALT 16 08/20/2007 1405   ALKPHOS 44 08/20/2007 1405   BILITOT 0.5 08/20/2007 1405   PROT 6.0 08/20/2007 1405   ALBUMIN 3.6 08/20/2007 1405    ABG    Component Value Date/Time   TCO2 29  09/25/2010 1431     Lab 07/27/11 0313  MG 1.6   Lab Results  Component Value Date   CALCIUM 9.5 07/27/2011   CXR 4/1 >> Chronic emphysematous changes and pulmonary scarring. No definite acute overlying pulmonary process.  Assessment and Plan:  COPD exacerbation:  In the setting of productive cough and symptomatic worsening of her SOB for the past week.  - Continue O2 via [place]. - Continue Azithromycin for acute bronchitis - Continue PRN inhalers and nbs - Continue Prednisone taper - KVO IVF  - Lasix 40mg  PO BID - Mucinex BID - Tobacco cessation counseling.  Leukocytosis:  Patient has a WBC of 13.8. This is stress related and secondary to prednisone administration. Patient is afebrile, we will check a UA to look for infection. CXR is not suggestive of pneumonia. Continue azithromycin for COPD exac.   Hyponatremia:  Calculated serum osmolality 267, differential for hypo-osmolar hyponatremia include volume depletion, hormonal changes, such adrenal insufficiency, hypothyroidism (however TSH was WNL on 07/27/11), and ectopic production of atrial natriuretic peptide. Given the patients physical exam findings, history of COPD, and chronicity of her hyponatremia she likely has SIADH. Fluid restrict and administer lasix 40mg  PO BID.   Best practices / Disposition: -->full code -->Heparin for DVT Px -->Protonix for GI Px -->diet  Darnelle Maffucci, M.D.   07/27/2011, 11:59 AM  Unlikely COPD exacerbation, CXR consistent with pulmonary edema.  Will KVO IVF and give lasix and monitor I/O closely.  If deteriorates, will start BiPAP.  Will follow with you.  Keep dry.  Patient seen and examined, agree with above note.  I dictated the care and orders written for this  patient under my direction.  Koren Bound, M.D. [phone]

## 2011-07-27 NOTE — Progress Notes (Signed)
Patient ID: [name], female   DOB: [birth year], 78 y.o.   MRN: [id number]   SUBJECTIVE: The patient was admitted last night with COPD exacerbation. She had atrial fib that is new for her. The internal medicine team request of the cardiology admit her because of her atrial fibrillation. Since that time she's converted to sinus rhythm. However her pulmonary status is unstable. She got up to go to the bathroom this morning and on her way back to bed she had extreme shortness of breath. She continues to be short of breath at this time. Her O2 sat is 91%. She wears chronic home O2.    Filed Vitals:   07/27/11 0737 07/27/11 0800 07/27/11 0902 07/27/11 1106  BP: 155/63 169/64    Pulse: 74 75    Temp: 98 F (36.7 C)     TempSrc: Oral     Resp: 20 21    Height:      Weight:      SpO2: 97% 96% 93% 93%    Intake/Output Summary (Last 24 hours) at 07/27/11 1109 Last data filed at 07/27/11 0826  Gross per 24 hour  Intake    795 ml  Output    600 ml  Net    195 ml    LABS: Basic Metabolic Panel:  Basename 07/27/11 0313 07/26/11 1946  NA 125* 125*  K 3.7 3.4*  CL 89* 87*  CO2 28 26  GLUCOSE 139* 164*  BUN 7 7  CREATININE 0.48* 0.55  CALCIUM 9.5 10.1  MG 1.6 --  PHOS -- --   Liver Function Tests: No results found for this basename: AST:2,ALT:2,ALKPHOS:2,BILITOT:2,PROT:2,ALBUMIN:2 in the last 72 hours No results found for this basename: LIPASE:2,AMYLASE:2 in the last 72 hours CBC:  Basename 07/26/11 1946  WBC 13.8*  NEUTROABS 11.3*  HGB 13.6  HCT 39.1  MCV 87.1  PLT 248   Cardiac Enzymes: No results found for this basename: CKTOTAL:3,CKMB:3,CKMBINDEX:3,TROPONINI:3 in the last 72 hours BNP: No components found with this basename: POCBNP:3 D-Dimer: No results found for this basename: DDIMER:2 in the last 72 hours Hemoglobin A1C: No results found for this basename: HGBA1C in the last 72 hours Fasting Lipid Panel: No results found for this basename:  CHOL,HDL,LDLCALC,TRIG,CHOLHDL,LDLDIRECT in the last 72 hours Thyroid Function Tests: No results found for this basename: TSH,T4TOTAL,FREET3,T3FREE,THYROIDAB in the last 72 hours  RADIOLOGY: Dg Chest Port 1 View  07/26/2011  *RADIOLOGY REPORT*  Clinical Data: Chest pain and shortness of breath.  PORTABLE CHEST - 1 VIEW  Comparison: 09/01/2010.  Findings: The cardiac silhouette, mediastinal and hilar contours are within normal limits and stable.  Stable surgical changes from bypass surgery.  Stable emphysematous changes and pulmonary scarring.  No definite acute overlying pulmonary process.  IMPRESSION: Chronic emphysematous changes and pulmonary scarring.  No definite acute overlying pulmonary process.  Original Report Authenticated By: P. Loralie Champagne, M.D.    PHYSICAL EXAM Patient is oriented to person time and place. Affect is normal. She's very short of breath at this time. Lung exam reveals decreased breath sounds. She has kyphosis of the spine. Cardiac exam reveals distant heart sounds. There is no significant peripheral edema.   TELEMETRY: I reviewed telemetry. The patient is now converted to sinus rhythm.   ASSESSMENT AND PLAN:    CAD (coronary artery disease)   Coronary disease is stable. No further workup.   Aortic valve sclerosis   Hypertension    Blood pressure stable at this time.   GI bleed  In the past the patient had GI bleeding from AVMs. Hemoglobin is stable at this time. I am hopeful that we will not have to consider Coumadin.   Hyponatremia   Patient's sodium is low at 125. It is of note that she's had chronic hyponatremia. This will need to be followed.   COPD (chronic obstructive pulmonary disease)    Patient has very severe disease. In addition unfortunately she continues to smoke. She is now not able to move from the bed to the bedside commode. I have personally called and asked for consultation from the pulmonary specialist.   Tobacco abuse   Of course  we have tried many times over the years to get her to stop smoking.   Bradycardia    When I saw her last as an outpatient she had some bradycardia. She was on very high-dose beta blocker. This was decreased to 200 mg metoprolol twice a day. If the pulmonary team feels that we should wean her beta blockers this will be acceptable.   Atrial fibrillation   So far we saw only limited atrial fib with severe pulmonary exacerbation. We will follow her rhythm.  As part of his evaluation I have seen and examined the patient. I've spoken with family members. I have called for pulmonary consultation. I reviewed the situation at length with the patient's nurse.  Willa Rough 07/27/2011 11:09 AM

## 2011-07-27 NOTE — Plan of Care (Signed)
Problem: Phase III Progression Outcomes Goal: Sinus rhythm established or heart rate < 100 at rest Outcome: Progressing 07/27/11 - Pt converted back to NSR @ 0329; EKG obtained

## 2011-07-27 NOTE — Progress Notes (Signed)
ANTICOAGULATION CONSULT NOTE - Initial Consult  Pharmacy Consult for heparin   Indication: atrial fibrillation  Allergies  Allergen Reactions  . Codeine   . Penicillins   . Ranitidine Hcl   . Sulfonamide Derivatives     REACTION: nausea    Patient Measurements: Height: 5\' 11"  (180.3 cm) Weight: 173 lb 4.5 oz (78.6 kg) IBW/kg (Calculated) : 70.8  Heparin Dosing Weight:   Vital Signs: Temp: 98.1 F (36.7 C) (04/01 0117) Temp src: Oral (04/01 0117) BP: 148/77 mmHg (04/01 0117) Pulse Rate: 111  (04/01 0117)  Labs:  Basename 07/26/11 1946  HGB 13.6  HCT 39.1  PLT 248  APTT --  LABPROT --  INR --  HEPARINUNFRC --  CREATININE 0.55  CKTOTAL --  CKMB --  TROPONINI --   Estimated Creatinine Clearance: 63.7 ml/min (by C-G formula based on Cr of 0.55).  Medical History: Past Medical History  Diagnosis Date  . CAD (coronary artery disease)   . Aortic valve sclerosis   . Hypertension   . Hyperlipidemia   . GI bleed     AVMs  . Aneurysm, aortic     thoracic aorta, stable at 4.1 cm, chest CT, May, 2012  . Syncope     Nitroglycerin plus a diuretic, April, 2009  . Hyponatremia   . COPD (chronic obstructive pulmonary disease)   . Tobacco abuse   . Bradycardia   . Hx of CABG     2005  . Ejection fraction     EF 60-65%, echo, July, 2010  . Carotid artery disease     Doppler, October, 2011, stable, 0-39% RIC A., 40-59% LICA  . Tuberculosis     Exposures to tuberculosis 1970s, has tested negative by the health Department  . Edema     July, 2012    Medications:  Prescriptions prior to admission  Medication Sig Dispense Refill  . albuterol (PROVENTIL HFA;VENTOLIN HFA) 108 (90 BASE) MCG/ACT inhaler Inhale 2 puffs into the lungs every 6 (six) hours as needed. For shortness of breath      . aspirin 81 MG tablet Take 81 mg by mouth daily.        . budesonide-formoterol (SYMBICORT) 160-4.5 MCG/ACT inhaler Inhale 2 puffs into the lungs 2 (two) times daily.        Marland Kitchen  docusate sodium (COLACE) 100 MG capsule Take 100 mg by mouth 2 (two) times daily.        . ergocalciferol (VITAMIN D2) 50000 UNITS capsule 1 capsule by mouth every other week      . ibuprofen (ADVIL,MOTRIN) 200 MG tablet Take 200 mg by mouth every 8 (eight) hours as needed.        . IPRATROPIUM BROMIDE NA 2 sprays by Nasal route as needed.        Marland Kitchen lisinopril (PRINIVIL,ZESTRIL) 10 MG tablet Take 10 mg by mouth daily.      . metoprolol (LOPRESSOR) 100 MG tablet Take 100 mg by mouth 2 (two) times daily.      . nicotine (NICOTROL) 10 MG inhaler Inhale 1 puff into the lungs as needed.        . potassium chloride SA (K-DUR,KLOR-CON) 20 MEQ tablet Take 20 mEq by mouth 2 (two) times daily.        Marland Kitchen selenium sulfide (SELSUN) 2.5 % shampoo As directed      . sennosides-docusate sodium (SENOKOT-S) 8.6-50 MG tablet Take 1 tablet by mouth daily.      Marland Kitchen tiotropium (SPIRIVA) 18 MCG  inhalation capsule Place 18 mcg into inhaler and inhale daily.        . verapamil (CALAN-SR) 180 MG CR tablet Take 180 mg by mouth 2 (two) times daily.          Assessment: Afib with RVR Chads2vasc score 4 COPD tobacco abuse with O2 dependecy    Goal of Therapy:  Heparin level 0.3-0.7 units/ml   Plan: Heparin 4000 units ivx1 then 1000 units/hr  6 hour HL then daily with cbc starting 4/2   Janice Coffin 07/27/2011,2:02 AM

## 2011-07-27 NOTE — Progress Notes (Signed)
ANTICOAGULATION CONSULT NOTE -follow up Consult  Pharmacy Consult for heparin   Indication: atrial fibrillation  Allergies  Allergen Reactions  . Codeine   . Penicillins   . Ranitidine Hcl   . Sulfonamide Derivatives     REACTION: nausea    Patient Measurements: Height: 5\' 11"  (180.3 cm) Weight: 173 lb 4.5 oz (78.6 kg) IBW/kg (Calculated) : 70.8  Heparin Dosing Weight:   Vital Signs: Temp: 98 F (36.7 C) (04/01 0737) Temp src: Oral (04/01 0737) BP: 169/64 mmHg (04/01 0800) Pulse Rate: 75  (04/01 0800)  Labs:  Basename 07/27/11 0903 07/27/11 0313 07/26/11 1946  HGB -- -- 13.6  HCT -- -- 39.1  PLT -- -- 248  APTT -- -- --  LABPROT -- -- --  INR -- -- --  HEPARINUNFRC 0.14* -- --  CREATININE -- 0.48* 0.55  CKTOTAL -- -- --  CKMB -- -- --  TROPONINI -- -- --   Estimated Creatinine Clearance: 63.7 ml/min (by C-G formula based on Cr of 0.48).  Medical History: Past Medical History  Diagnosis Date  . CAD (coronary artery disease)   . Aortic valve sclerosis   . Hypertension   . Hyperlipidemia   . GI bleed     AVMs  . Aneurysm, aortic     thoracic aorta, stable at 4.1 cm, chest CT, May, 2012  . Syncope     Nitroglycerin plus a diuretic, April, 2009  . Hyponatremia   . COPD (chronic obstructive pulmonary disease)   . Tobacco abuse   . Bradycardia   . Hx of CABG     2005  . Ejection fraction     EF 60-65%, echo, July, 2010  . Carotid artery disease     Doppler, October, 2011, stable, 0-39% RIC A., 40-59% LICA  . Tuberculosis     Exposures to tuberculosis 1970s, has tested negative by the health Department  . Edema     July, 2012  . Atrial fibrillation     New diagnosis July 27, 2011 with COPD exacerbation    Medications:  Prescriptions prior to admission  Medication Sig Dispense Refill  . albuterol (PROVENTIL HFA;VENTOLIN HFA) 108 (90 BASE) MCG/ACT inhaler Inhale 2 puffs into the lungs every 6 (six) hours as needed. For shortness of breath      .  aspirin 81 MG tablet Take 81 mg by mouth daily.        . budesonide-formoterol (SYMBICORT) 160-4.5 MCG/ACT inhaler Inhale 2 puffs into the lungs 2 (two) times daily.        Marland Kitchen docusate sodium (COLACE) 100 MG capsule Take 100 mg by mouth 2 (two) times daily.        . ergocalciferol (VITAMIN D2) 50000 UNITS capsule 1 capsule by mouth every other week      . ibuprofen (ADVIL,MOTRIN) 200 MG tablet Take 200 mg by mouth every 8 (eight) hours as needed.        . IPRATROPIUM BROMIDE NA 2 sprays by Nasal route as needed.        Marland Kitchen lisinopril (PRINIVIL,ZESTRIL) 10 MG tablet Take 10 mg by mouth daily.      . metoprolol (LOPRESSOR) 100 MG tablet Take 100 mg by mouth 2 (two) times daily.      . nicotine (NICOTROL) 10 MG inhaler Inhale 1 puff into the lungs as needed.        . potassium chloride SA (K-DUR,KLOR-CON) 20 MEQ tablet Take 20 mEq by mouth 2 (two) times daily.        Marland Kitchen  selenium sulfide (SELSUN) 2.5 % shampoo As directed      . sennosides-docusate sodium (SENOKOT-S) 8.6-50 MG tablet Take 1 tablet by mouth daily.      Marland Kitchen tiotropium (SPIRIVA) 18 MCG inhalation capsule Place 18 mcg into inhaler and inhale daily.        . verapamil (CALAN-SR) 180 MG CR tablet Take 180 mg by mouth 2 (two) times daily.          Assessment: Afib with RVR Chads2vasc score 4 COPD tobacco abuse with O2 dependecy.  Heparin drip 1000 uts/hr Hl 0.14 < goal 0.3-0.7.  No bleeding noted, renal fx stable, no cbc today will check tomorrow.    Goal of Therapy:  Heparin level 0.3-0.7 units/ml   Plan: Heparin 1000 units ivx1 then 1150 units/hr  6 hour HL then daily with cbc starting 4/2   Marcelino Scot 07/27/2011,11:05 AM

## 2011-07-27 NOTE — Progress Notes (Signed)
Pt has converted from Afib to NSR with HR in 60s; Cardizem gtt titrated off; rhythm confirmed with EKG; Dr. Maryelizabeth Kaufmann on unit; will update.  Lavone Orn

## 2011-07-28 DIAGNOSIS — E876 Hypokalemia: Secondary | ICD-10-CM

## 2011-07-28 DIAGNOSIS — Z92241 Personal history of systemic steroid therapy: Secondary | ICD-10-CM

## 2011-07-28 DIAGNOSIS — J811 Chronic pulmonary edema: Secondary | ICD-10-CM

## 2011-07-28 DIAGNOSIS — I2510 Atherosclerotic heart disease of native coronary artery without angina pectoris: Secondary | ICD-10-CM

## 2011-07-28 LAB — BASIC METABOLIC PANEL
BUN: 10 mg/dL (ref 6–23)
GFR calc Af Amer: >90 mL/min (ref >90)
GFR calc non Af Amer: 84 mL/min — ABNORMAL LOW (ref >90)
Potassium: 3.1 mEq/L — ABNORMAL LOW (ref 3.5–5.1)
Sodium: 125 mEq/L — ABNORMAL LOW (ref 135–145)

## 2011-07-28 LAB — CBC
HCT: 35.1 % — ABNORMAL LOW (ref 36.0–46.0)
MCHC: 35 g/dL (ref 30.0–36.0)
RDW: 12.5 % (ref 11.5–15.5)

## 2011-07-28 MED ORDER — WHITE PETROLATUM GEL
Status: AC
  Administered 2011-07-28: 23:00:00
  Filled 2011-07-28: qty 5

## 2011-07-28 MED ORDER — POTASSIUM CHLORIDE CRYS ER 20 MEQ PO TBCR
40.0000 meq | EXTENDED_RELEASE_TABLET | Freq: Once | ORAL | Status: AC
  Administered 2011-07-28: 40 meq via ORAL
  Filled 2011-07-28: qty 2

## 2011-07-28 MED ORDER — POTASSIUM CHLORIDE CRYS ER 20 MEQ PO TBCR: 40.0000 meq | EXTENDED_RELEASE_TABLET | Freq: Once | ORAL | Status: DC

## 2011-07-28 MED ORDER — ATORVASTATIN CALCIUM 10 MG PO TABS
10.0000 mg | ORAL_TABLET | Freq: Every day | ORAL | Status: DC
  Administered 2011-07-28 – 2011-08-03 (×7): 10 mg via ORAL
  Filled 2011-07-28 (×8): qty 1

## 2011-07-28 MED ORDER — HEPARIN (PORCINE) IN NACL 100-0.45 UNIT/ML-% IJ SOLN
1600.0000 [IU]/h | INTRAMUSCULAR | Status: DC
  Administered 2011-07-28: 1600 [IU]/h via INTRAVENOUS
  Administered 2011-07-28: 1450 [IU]/h via INTRAVENOUS
  Administered 2011-07-28: 1600 [IU]/h via INTRAVENOUS
  Filled 2011-07-28 (×3): qty 250

## 2011-07-28 MED ORDER — ALBUTEROL SULFATE HFA 108 (90 BASE) MCG/ACT IN AERS
2.0000 | INHALATION_SPRAY | RESPIRATORY_TRACT | Status: DC | PRN
  Administered 2011-07-28 – 2011-07-29 (×3): 2 via RESPIRATORY_TRACT
  Filled 2011-07-28: qty 6.7

## 2011-07-28 MED ORDER — LISINOPRIL 20 MG PO TABS
20.0000 mg | ORAL_TABLET | Freq: Every day | ORAL | Status: DC
  Administered 2011-07-29 – 2011-08-04 (×7): 20 mg via ORAL
  Filled 2011-07-28 (×7): qty 1

## 2011-07-28 NOTE — Progress Notes (Signed)
HPI:  The patient is a 78 yo female with O2 dependant COPD admitted on 07/26/11 for SOB, she was also found to have atrial fib that was new for her. Since that time she's converted to sinus rhythm. However her pulmonary status remains unstable. She got up to go to the bathroom on 4/1 and on her way back to bed she experienced severe shortness of breath. Her O2 sat was >91% at that time with HR in 70's. Patient reports feeling sick for several days with productive cough and chills at home.   Lines / Drains:  None   Cultures:  None   Antibiotics:  Azithromycin 3/31 >>   Subjective: Patient laying comfortably in bed, in no acute distress, reports persistent SOB, but this has improved since yesterday. Saturating well on 2l [place].   Physical Exam: Filed Vitals:   07/28/11 1233  BP: 162/78  Pulse: 80  Temp: 98.1 F (36.7 C)  Resp: 27    Intake/Output Summary (Last 24 hours) at 07/28/11 1322 Last data filed at 07/28/11 1300  Gross per 24 hour  Intake    672 ml  Output   3950 ml  Net  -3278 ml     General: alert, well-developed, and cooperative to examination.  Lungs: CTA B Heart: normal rate, regular rhythm, no murmur, no gallop, and no rub.  Abdomen: soft, non-tender, normal bowel sounds, no distention, no guarding, no rebound tenderness, no hepatomegaly, and no splenomegaly.  Pulses: 2+ DP/PT pulses bilaterally Extremities: No cyanosis, clubbing, edema  Neurologic: alert & oriented X3,    Labs: CBC:    Component Value Date/Time   WBC 15.3* 07/28/2011 0515   HGB 12.3 07/28/2011 0515   HCT 35.1* 07/28/2011 0515   PLT 275 07/28/2011 0515   MCV 84.8 07/28/2011 0515   NEUTROABS 11.3* 07/26/2011 1946   LYMPHSABS 1.0 07/26/2011 1946   MONOABS 1.4* 07/26/2011 1946   EOSABS 0.1 07/26/2011 1946   BASOSABS 0.0 07/26/2011 1946     Basic Metabolic Panel:    Component Value Date/Time   NA 125* 07/28/2011 0515   K 3.1* 07/28/2011 0515   CL 86* 07/28/2011 0515   CO2 31 07/28/2011 0515   BUN 10  07/28/2011 0515   CREATININE 0.62 07/28/2011 0515   GLUCOSE 116* 07/28/2011 0515   CALCIUM 9.8 07/28/2011 0515   Comprehensive Metabolic Panel:    Component Value Date/Time   NA 125* 07/28/2011 0515   K 3.1* 07/28/2011 0515   CL 86* 07/28/2011 0515   CO2 31 07/28/2011 0515   BUN 10 07/28/2011 0515   CREATININE 0.62 07/28/2011 0515   GLUCOSE 116* 07/28/2011 0515   CALCIUM 9.8 07/28/2011 0515   AST 18 08/20/2007 1405   ALT 16 08/20/2007 1405   ALKPHOS 44 08/20/2007 1405   BILITOT 0.5 08/20/2007 1405   PROT 6.0 08/20/2007 1405   ALBUMIN 3.6 08/20/2007 1405    ABG    Component Value Date/Time   TCO2 29 09/25/2010 1431     Lab 07/27/11 0313  MG 1.6   Lab Results  Component Value Date   CALCIUM 9.8 07/28/2011    Assessment & Plan:  COPD In the setting of productive cough and symptomatic worsening of her SOB for the past week likely related to URI.  - Continue O2 via [place].  - Continue Azithromycin for acute bronchitis for total of 5 days.  - Continue inhalers and nbs  - Continue Prednisone taper for total of 10 days - Lasix 40mg  PO  BID  - Mucinex BID  - Tobacco cessation counseling.   Hyponatremia Calculated serum osmolality 267, differential for hypo-osmolar hyponatremia include volume depletion, hormonal changes, such adrenal insufficiency, hypothyroidism (however TSH was WNL on 07/27/11), and ectopic production of atrial natriuretic peptide. Given the patients physical exam findings, history of COPD, and chronicity of her hyponatremia she likely has SIADH. Fluid restrict and administer lasix 40mg  PO BID.   Hypokalemia Replete with PO KCL  Hypertension Persistently elevated, increase lisinopril to 20mg  daily.   Best practices -->full code  -->Heparin for DVT Px  -->Protonix for GI Px  -->diet  Disposition Consider transfer to lower care given stable condition.    TOBBIA,PATRICK, MD  Patient required diureses, now appears well, would continue O2 at 2L as at home, do not expect sats to be  99%, keep between 88-92%.  Keep dry.  Continue current care with 5 days of azithromycin and prednisone taper.  Would transfer to tele if ok with cards.  PCCM signing off, please call back if needed.  Patient seen and examined, agree with above note.  I dictated the care and orders written for this patient under my direction.  Koren Bound, M.D. [phone]

## 2011-07-28 NOTE — Progress Notes (Signed)
  Echocardiogram 2D Echocardiogram has been performed.  Emelia Loron A 07/28/2011, 12:15 PM

## 2011-07-28 NOTE — Progress Notes (Signed)
ANTICOAGULATION CONSULT NOTE:Heparin for atrial fibrillation.  Heparin level = 0.24 on 1450 units/hr Goal heparin level = 0.3-0.7  Heparin level remains slightly subtherapeutic. Will increase rate to 1600 units/hr. Will f/u heparin level in 8 hrs.  Cardell Peach, PharmD

## 2011-07-28 NOTE — Progress Notes (Addendum)
ANTICOAGULATION CONSULT NOTE -follow up Consult  Pharmacy Consult for heparin   Indication: atrial fibrillation  Allergies  Allergen Reactions  . Codeine   . Penicillins   . Ranitidine Hcl   . Sulfonamide Derivatives     REACTION: nausea    Patient Measurements: Height: 5\' 11"  (180.3 cm) Weight: 173 lb 4.5 oz (78.6 kg) IBW/kg (Calculated) : 70.8  Heparin Dosing Weight:   Vital Signs: Temp: 98 F (36.7 C) (04/02 0402) Temp src: Oral (04/02 0402) BP: 184/75 mmHg (04/02 0400) Pulse Rate: 76  (04/02 0400)  Labs:  Basename 07/28/11 0515 07/27/11 1726 07/27/11 0903 07/27/11 0313 07/26/11 1946  HGB 12.3 -- -- -- 13.6  HCT 35.1* -- -- -- 39.1  PLT 275 -- -- -- 248  APTT -- -- -- -- --  LABPROT -- -- -- -- --  INR -- -- -- -- --  HEPARINUNFRC 0.20* 0.19* 0.14* -- --  CREATININE 0.62 -- -- 0.48* 0.55  CKTOTAL -- -- -- -- --  CKMB -- -- -- -- --  TROPONINI -- -- -- -- --   Estimated Creatinine Clearance: 63.7 ml/min (by C-G formula based on Cr of 0.62).  Medical History: Past Medical History  Diagnosis Date  . CAD (coronary artery disease)   . Aortic valve sclerosis   . Hypertension   . Hyperlipidemia   . GI bleed     AVMs  . Aneurysm, aortic     thoracic aorta, stable at 4.1 cm, chest CT, May, 2012  . Syncope     Nitroglycerin plus a diuretic, April, 2009  . Hyponatremia   . COPD (chronic obstructive pulmonary disease)   . Tobacco abuse   . Bradycardia   . Hx of CABG     2005  . Ejection fraction     EF 60-65%, echo, July, 2010  . Carotid artery disease     Doppler, October, 2011, stable, 0-39% RIC A., 40-59% LICA  . Tuberculosis     Exposures to tuberculosis 1970s, has tested negative by the health Department  . Edema     July, 2012  . Atrial fibrillation     New diagnosis July 27, 2011 with COPD exacerbation    Medications:  Prescriptions prior to admission  Medication Sig Dispense Refill  . albuterol (PROVENTIL HFA;VENTOLIN HFA) 108 (90 BASE)  MCG/ACT inhaler Inhale 2 puffs into the lungs every 6 (six) hours as needed. For shortness of breath      . aspirin 81 MG tablet Take 81 mg by mouth daily.        . budesonide-formoterol (SYMBICORT) 160-4.5 MCG/ACT inhaler Inhale 2 puffs into the lungs 2 (two) times daily.        Marland Kitchen docusate sodium (COLACE) 100 MG capsule Take 100 mg by mouth 2 (two) times daily.        . ergocalciferol (VITAMIN D2) 50000 UNITS capsule 1 capsule by mouth every other week      . ibuprofen (ADVIL,MOTRIN) 200 MG tablet Take 200 mg by mouth every 8 (eight) hours as needed.        . IPRATROPIUM BROMIDE NA 2 sprays by Nasal route as needed.        Marland Kitchen lisinopril (PRINIVIL,ZESTRIL) 10 MG tablet Take 10 mg by mouth daily.      . metoprolol (LOPRESSOR) 100 MG tablet Take 100 mg by mouth 2 (two) times daily.      . nicotine (NICOTROL) 10 MG inhaler Inhale 1 puff into the lungs as needed.        Marland Kitchen  potassium chloride SA (K-DUR,KLOR-CON) 20 MEQ tablet Take 20 mEq by mouth 2 (two) times daily.        Marland Kitchen selenium sulfide (SELSUN) 2.5 % shampoo As directed      . sennosides-docusate sodium (SENOKOT-S) 8.6-50 MG tablet Take 1 tablet by mouth daily.      Marland Kitchen tiotropium (SPIRIVA) 18 MCG inhalation capsule Place 18 mcg into inhaler and inhale daily.        . verapamil (CALAN-SR) 180 MG CR tablet Take 180 mg by mouth 2 (two) times daily.          Assessment: Afib with RVR Chads2 vasc score 4 COPD tobacco abuse with O2 dependecy. Heparin level below goal this AM, CBC noted.  No complications noted.  Goal of Therapy:  Heparin level 0.3-0.7 units/ml   Plan:  1.  Increase IV heparin to 1450 units/hr.  Recheck heparin level in 6 hrs. 2. Continue daily heparin level and CBC.  Reece Leader, Pharm D 07/28/2011 8:51 AM

## 2011-07-28 NOTE — Progress Notes (Signed)
Patient ID: [name], female   DOB: 1929-11-05, 78 y.o.   MRN: [id number]   SUBJECTIVE:  The patient is still short of breath. However she looks much better than when I saw her yesterday morning. She has had significant diuresis. I appreciate the input from Dr.Yacoub. He felt that a significant component of her problem with fluid overload. She has been diuresing. She's not having any significant chest pain.   Filed Vitals:   07/28/11 0400 07/28/11 0402 07/28/11 0506 07/28/11 0700  BP: 184/75   164/94  Pulse: 76   81  Temp:  98 F (36.7 C)    TempSrc:  Oral    Resp: 23   19  Height:      Weight:   171 lb 1.2 oz (77.6 kg)   SpO2: 96%  93% 95%    Intake/Output Summary (Last 24 hours) at 07/28/11 0803 Last data filed at 07/28/11 0700  Gross per 24 hour  Intake 1062.5 ml  Output   3450 ml  Net -2387.5 ml    LABS: Basic Metabolic Panel:  Basename 07/28/11 0515 07/27/11 0313  NA 125* 125*  K 3.1* 3.7  CL 86* 89*  CO2 31 28  GLUCOSE 116* 139*  BUN 10 7  CREATININE 0.62 0.48*  CALCIUM 9.8 9.5  MG -- 1.6  PHOS -- --   Liver Function Tests: No results found for this basename: AST:2,ALT:2,ALKPHOS:2,BILITOT:2,PROT:2,ALBUMIN:2 in the last 72 hours No results found for this basename: LIPASE:2,AMYLASE:2 in the last 72 hours CBC:  Basename 07/28/11 0515 07/26/11 1946  WBC 15.3* 13.8*  NEUTROABS -- 11.3*  HGB 12.3 13.6  HCT 35.1* 39.1  MCV 84.8 87.1  PLT 275 248   Cardiac Enzymes: No results found for this basename: CKTOTAL:3,CKMB:3,CKMBINDEX:3,TROPONINI:3 in the last 72 hours BNP: No components found with this basename: POCBNP:3 D-Dimer: No results found for this basename: DDIMER:2 in the last 72 hours Hemoglobin A1C: No results found for this basename: HGBA1C in the last 72 hours Fasting Lipid Panel: No results found for this basename: CHOL,HDL,LDLCALC,TRIG,CHOLHDL,LDLDIRECT in the last 72 hours Thyroid Function Tests:  Basename 07/27/11 0313  TSH 0.942    T4TOTAL --  T3FREE --  THYROIDAB --    RADIOLOGY: Dg Chest Port 1 View  07/26/2011  *RADIOLOGY REPORT*  Clinical Data: Chest pain and shortness of breath.  PORTABLE CHEST - 1 VIEW  Comparison: 09/01/2010.  Findings: The cardiac silhouette, mediastinal and hilar contours are within normal limits and stable.  Stable surgical changes from bypass surgery.  Stable emphysematous changes and pulmonary scarring.  No definite acute overlying pulmonary process.  IMPRESSION: Chronic emphysematous changes and pulmonary scarring.  No definite acute overlying pulmonary process.  Original Report Authenticated By: P. Loralie Champagne, M.D.    PHYSICAL EXAM   Patient remained short of breath but more stable and yesterday. Lungs reveal decreased breath sounds. There is few scattered rhonchi. There are scattered rales. Cardiac exam reveals S1 and S2. There is a soft systolic murmur. The abdomen is soft. Is no significant peripheral edema.  TELEMETRY: I have reviewed telemetry. She has sinus rhythm.   ASSESSMENT AND PLAN:    OXYGEN-USE OF SUPPLEMENTAL   CAD (coronary artery disease)    Coronary disease has been stable. She is post CABG. Two-dimensional echo will be done to reassess LV function.   Aortic valve sclerosis   With her echo we will see if there's been any marked change in her aortic valve.   Hypertension   GI  bleed   There is a history of a GI bleed with AVMs. This is why we hope to not use long-term anticoagulation. She remains on heparin in the hospital as of today.   Hyponatremia   The patient does have hyponatremia chronically. At this point we will see what happens to her sodium as she diuresis without significant free water input.   COPD (chronic obstructive pulmonary disease)    Patient has severe COPD. The critical care team is helping to decide how much of her current illness is her COPD. She is on medications including a tapering steroid dose   Tobacco abuse   Bradycardia     She has not had any significant bradycardia during this admission.   Atrial fibrillation   Patient came in with atrial fib but converted rapidly to sinus rhythm she is holding sinus.   Hypoxemia   Pulmonary edema    I appreciate the input from critical care team concerning her volume status yesterday. Historically she's had good LV function. I wonder if her volume overload is partially related to decompensation of her pulmonary status. We will continue diuresis. Two-dimensional echo will be done to reassess LV functionn.   Hypokalemia    Patient's potassium is 3.1. Potassium will be ordered.   H/O steroid therapy   Patient is on steroids which are to be tapered over time. This will be followed.   Hyperlipidemia.    I have reviewed records to see why I did not have her on a statin up to this point. I do not find any evidence of statin intolerance. I've discussed this with the patient and I find no evidence of statin intolerance. Therefore statin will be started.  We have been trying not to place a Foley if possible. However she is extremely limited and has marked fatigue when trying to move at all. Urine yesterday showed no significant bacteria. Culture is still pending.    Willa Rough 07/28/2011 8:03 AM

## 2011-07-29 ENCOUNTER — Other Ambulatory Visit: Payer: Self-pay

## 2011-07-29 ENCOUNTER — Inpatient Hospital Stay (HOSPITAL_COMMUNITY): Payer: Medicare Other

## 2011-07-29 DIAGNOSIS — J9610 Chronic respiratory failure, unspecified whether with hypoxia or hypercapnia: Secondary | ICD-10-CM | POA: Diagnosis present

## 2011-07-29 LAB — BASIC METABOLIC PANEL
BUN: 16 mg/dL (ref 6–23)
CO2: 30 mEq/L (ref 19–32)
GFR calc non Af Amer: 81 mL/min — ABNORMAL LOW (ref >90)
Glucose, Bld: 96 mg/dL (ref 70–99)
Potassium: 3.4 mEq/L — ABNORMAL LOW (ref 3.5–5.1)
Sodium: 124 mEq/L — ABNORMAL LOW (ref 135–145)

## 2011-07-29 LAB — CBC
HCT: 34.3 % — ABNORMAL LOW (ref 36.0–46.0)
Hemoglobin: 12.3 g/dL (ref 12.0–15.0)
MCHC: 35.9 g/dL (ref 30.0–36.0)
MCV: 84.5 fL (ref 78.0–100.0)
RDW: 12.5 % (ref 11.5–15.5)

## 2011-07-29 LAB — HEPARIN LEVEL (UNFRACTIONATED): Heparin Unfractionated: 0.37 IU/mL (ref 0.30–0.70)

## 2011-07-29 LAB — URINE CULTURE

## 2011-07-29 MED ORDER — DILTIAZEM LOAD VIA INFUSION
10.0000 mg | Freq: Once | INTRAVENOUS | Status: AC
  Administered 2011-07-29: 10 mg via INTRAVENOUS
  Filled 2011-07-29: qty 10

## 2011-07-29 MED ORDER — HEPARIN (PORCINE) IN NACL 100-0.45 UNIT/ML-% IJ SOLN
1450.0000 [IU]/h | INTRAMUSCULAR | Status: DC
  Administered 2011-07-29: 1600 [IU]/h via INTRAVENOUS
  Administered 2011-07-30: 1450 [IU]/h via INTRAVENOUS
  Administered 2011-07-30: 1600 [IU]/h via INTRAVENOUS
  Filled 2011-07-29 (×5): qty 250

## 2011-07-29 MED ORDER — ENOXAPARIN SODIUM 40 MG/0.4ML ~~LOC~~ SOLN
40.0000 mg | SUBCUTANEOUS | Status: DC
  Filled 2011-07-29: qty 0.4

## 2011-07-29 MED ORDER — LEVALBUTEROL TARTRATE 45 MCG/ACT IN AERO
2.0000 | INHALATION_SPRAY | RESPIRATORY_TRACT | Status: DC | PRN
  Administered 2011-07-29 – 2011-07-31 (×4): 2 via RESPIRATORY_TRACT
  Filled 2011-07-29: qty 15

## 2011-07-29 MED ORDER — DILTIAZEM HCL 100 MG IV SOLR
5.0000 mg/h | INTRAVENOUS | Status: DC
  Administered 2011-07-29 – 2011-07-30 (×2): 5 mg/h via INTRAVENOUS
  Filled 2011-07-29: qty 100

## 2011-07-29 NOTE — Progress Notes (Signed)
ANTICOAGULATION CONSULT NOTE -follow up Consult  Pharmacy Consult for heparin   Indication: atrial fibrillation  Allergies  Allergen Reactions  . Codeine   . Penicillins   . Ranitidine Hcl   . Sulfonamide Derivatives     REACTION: nausea    Patient Measurements: Height: 5\' 11"  (180.3 cm) Weight: 171 lb 1.2 oz (77.6 kg) IBW/kg (Calculated) : 70.8  Heparin Dosing Weight: 78 kg  Vital Signs: Temp: 98.1 F (36.7 C) (04/03 0004) Temp src: Oral (04/03 0004) BP: 156/56 mmHg (04/02 2355) Pulse Rate: 71  (04/02 2355)  Labs:  [name] 07/28/11 2337 07/28/11 1458 07/28/11 0515 07/27/11 0313 07/26/11 1946  HGB -- -- 12.3 -- 13.6  HCT -- -- 35.1* -- 39.1  PLT -- -- 275 -- 248  APTT -- -- -- -- --  LABPROT -- -- -- -- --  INR -- -- -- -- --  HEPARINUNFRC 0.37 0.24* 0.20* -- --  CREATININE -- -- 0.62 0.48* 0.55  CKTOTAL -- -- -- -- --  CKMB -- -- -- -- --  TROPONINI -- -- -- -- --   Estimated Creatinine Clearance: 63.7 ml/min (by C-G formula based on Cr of 0.62).  Medical History: Past Medical History  Diagnosis Date  . CAD (coronary artery disease)   . Aortic valve sclerosis   . Hypertension   . Hyperlipidemia   . GI bleed     AVMs  . Aneurysm, aortic     thoracic aorta, stable at 4.1 cm, chest CT, May, 2012  . Syncope     Nitroglycerin plus a diuretic, April, 2009  . Hyponatremia   . COPD (chronic obstructive pulmonary disease)   . Tobacco abuse   . Bradycardia   . Hx of CABG     2005  . Ejection fraction     EF 60-65%, echo, July, 2010  . Carotid artery disease     Doppler, October, 2011, stable, 0-39% RIC A., 40-59% LICA  . Tuberculosis     Exposures to tuberculosis 1970s, has tested negative by the health Department  . Edema     July, 2012  . Atrial fibrillation     New diagnosis July 27, 2011 with COPD exacerbation    Medications:  Prescriptions prior to admission  Medication Sig Dispense Refill  . albuterol (PROVENTIL HFA;VENTOLIN HFA) 108 (90  BASE) MCG/ACT inhaler Inhale 2 puffs into the lungs every 6 (six) hours as needed. For shortness of breath      . aspirin 81 MG tablet Take 81 mg by mouth daily.        . budesonide-formoterol (SYMBICORT) 160-4.5 MCG/ACT inhaler Inhale 2 puffs into the lungs 2 (two) times daily.        Marland Kitchen docusate sodium (COLACE) 100 MG capsule Take 100 mg by mouth 2 (two) times daily.        . ergocalciferol (VITAMIN D2) 50000 UNITS capsule 1 capsule by mouth every other week      . ibuprofen (ADVIL,MOTRIN) 200 MG tablet Take 200 mg by mouth every 8 (eight) hours as needed.        . IPRATROPIUM BROMIDE NA 2 sprays by Nasal route as needed.        Marland Kitchen lisinopril (PRINIVIL,ZESTRIL) 10 MG tablet Take 10 mg by mouth daily.      . metoprolol (LOPRESSOR) 100 MG tablet Take 100 mg by mouth 2 (two) times daily.      . nicotine (NICOTROL) 10 MG inhaler Inhale 1 puff into the lungs as  needed.        . potassium chloride SA (K-DUR,KLOR-CON) 20 MEQ tablet Take 20 mEq by mouth 2 (two) times daily.        Marland Kitchen selenium sulfide (SELSUN) 2.5 % shampoo As directed      . sennosides-docusate sodium (SENOKOT-S) 8.6-50 MG tablet Take 1 tablet by mouth daily.      Marland Kitchen tiotropium (SPIRIVA) 18 MCG inhalation capsule Place 18 mcg into inhaler and inhale daily.        . verapamil (CALAN-SR) 180 MG CR tablet Take 180 mg by mouth 2 (two) times daily.          Assessment: Afib with RVR Chads2 vasc score 4 COPD tobacco abuse with O2 dependecy.  Heparin level (0.47) is at-goal on 1600 units/hr.   Goal of Therapy:  Heparin level 0.3-0.7 units/ml   Plan:  1. Continue IV heparin at 1600 units/hr. 2. Continue daily heparin level and CBC.  Lorre Munroe, PharmD  07/29/2011 1:45 AM

## 2011-07-29 NOTE — Progress Notes (Signed)
I was asked by Dr. Myrtis Ser to assist with transfer orders on patient but upon calling to clarify a medication on MAR, I was notified by RN that patient was back in AF RVR and symptomatic with HR 130-160s with BP 166/72. EKG confirms return of afib with RVR without acute ST/T changes Per discussion with Dr. Myrtis Ser, will keep on IV heparin (we were previously going to change to DVT ppx lovenox), keep in stepdown, and re-initiate IV diltiazem. I gave her a 10mg  IV diltiazem with good improvement in HR down into low 100's and she is now on a gtt starting at 5mg /hr. Hopefully her AM dose of lopressor will also set in - may need po maintenance diltiazem. Nurse notes that AF RVR returned after breathing tx; got albuterol HFA this AM - will change to Xopenex to hopefully prevent recurrence. The patient complains of persistent cough and will add flutter valve to see if it helps with her expectoration. Will continue to monitor. I also called pharmacy to clarify the heparin to avoid any confusion.  Aland Chestnutt PA-C

## 2011-07-29 NOTE — Progress Notes (Signed)
REFUSED TO GO DOWN FOR CHEST XRAY CLAIMING SHE FEELS  WEAK TO STAND UP ANS ASKED FOR BREATHING TX AND GIVEN BY RT. HR- WENT UP T0 140- 150 , BP 166/72, R- 23 ON O2 3L [name] . SCHEDULED DOSE OF LOPRESSOR 100 MG PO GIVEN. BUT HR SUSTAINED TO 140-150. STILL FEELING WEAK. PA MADE  AWARE, STARTED ON CARDIZEM  BOLUSED WITH 10 ML AND STARTED AT 5MG /HR.HR- WENT DOWN TO 90. CONTINUE TO MONITOR.

## 2011-07-29 NOTE — Progress Notes (Signed)
Patient ID: [name], female   DOB: 28-Aug-1929, 78 y.o.   MRN: [id number]  SUBJECTIVE:  Patient continues to improve. She continues to diuresis. She also continues to cough with greenish sputum. She is on erythromycin along with a steroid taper. Critical care medicine team has signed off. The patient says she does not feel as well this morning as she did yesterday. Overall she's stable. She complains of her sputum production.   Filed Vitals:   07/29/11 0350 07/29/11 0358 07/29/11 0500 07/29/11 0747  BP: 159/62   166/72  Pulse: 67   86  Temp:  97.9 F (36.6 C)  97.8 F (36.6 C)  TempSrc:  Oral  Oral  Resp: 20   19  Height:      Weight:   169 lb 8.5 oz (76.9 kg)   SpO2: 95%   95%    Intake/Output Summary (Last 24 hours) at 07/29/11 0755 Last data filed at 07/29/11 0700  Gross per 24 hour  Intake    505 ml  Output   1900 ml  Net  -1395 ml    LABS: Basic Metabolic Panel:  Basename 07/29/11 0530 07/28/11 0515 07/27/11 0313  NA 124* 125* --  K 3.4* 3.1* --  CL 84* 86* --  CO2 30 31 --  GLUCOSE 96 116* --  BUN 16 10 --  CREATININE 0.67 0.62 --  CALCIUM 9.8 9.8 --  MG -- -- 1.6  PHOS -- -- --   Liver Function Tests: No results found for this basename: AST:2,ALT:2,ALKPHOS:2,BILITOT:2,PROT:2,ALBUMIN:2 in the last 72 hours No results found for this basename: LIPASE:2,AMYLASE:2 in the last 72 hours CBC:  Basename 07/29/11 0530 07/28/11 0515 07/26/11 1946  WBC 14.6* 15.3* --  NEUTROABS -- -- 11.3*  HGB 12.3 12.3 --  HCT 34.3* 35.1* --  MCV 84.5 84.8 --  PLT 297 275 --   Cardiac Enzymes: No results found for this basename: CKTOTAL:3,CKMB:3,CKMBINDEX:3,TROPONINI:3 in the last 72 hours BNP: No components found with this basename: POCBNP:3 D-Dimer: No results found for this basename: DDIMER:2 in the last 72 hours Hemoglobin A1C: No results found for this basename: HGBA1C in the last 72 hours Fasting Lipid Panel: No results found for this basename:  CHOL,HDL,LDLCALC,TRIG,CHOLHDL,LDLDIRECT in the last 72 hours Thyroid Function Tests:  Basename 07/27/11 0313  TSH 0.942  T4TOTAL --  T3FREE --  THYROIDAB --    RADIOLOGY: Dg Chest Port 1 View  07/26/2011  *RADIOLOGY REPORT*  Clinical Data: Chest pain and shortness of breath.  PORTABLE CHEST - 1 VIEW  Comparison: 09/01/2010.  Findings: The cardiac silhouette, mediastinal and hilar contours are within normal limits and stable.  Stable surgical changes from bypass surgery.  Stable emphysematous changes and pulmonary scarring.  No definite acute overlying pulmonary process.  IMPRESSION: Chronic emphysematous changes and pulmonary scarring.  No definite acute overlying pulmonary process.  Original Report Authenticated By: P. Loralie Champagne, M.D.    PHYSICAL EXAM Patient is oriented to person time and place. Affect is normal. She is complaining of not feeling great this morning. There is no jugulovenous distention. Lungs reveal a few scattered rhonchi. There decreased breath sounds. There are a few rales. There is no respiratory distress. Abdomen is soft. There is no significant peripheral edema.   TELEMETRY: I reviewed the telemetry. She is holding sinus rhythm.   ASSESSMENT AND PLAN:   OXYGEN-USE OF SUPPLEMENTAL   She uses oxygen at home and this will be continued.   CAD (coronary artery disease)  Coronary disease is stable. She has not had an acute coronary syndrome this admission. Fortunately her echo shows that her LV function remains normal with an EF of 55% , Echo, July 28, 2011   Aortic valve sclerosis  This continues to be mild by echo of July 28, 2011   Hypertension   The patient's blood pressure is increased. Lisinopril was ordered to be restarted today.   Hyperlipidemia    A statin was started yesterday.   GI bleed   The patient has not had GI bleeding this admission. She's had a significant GI bleed in the past with AVMs. She had very limited atrial fibrillation with  her acute event on admission. I made the decision not to use Coumadin. I will change her IV heparin to Lovenox DVT prophylaxis.   Hyponatremia    Sodium remains low. We have never been able to completely normalize her sodium   COPD (chronic obstructive pulmonary disease)    The patient has an ongoing significant sputum production. She is continuing on erythromycin along with steroid taper. If her overall pulmonary status does not completely improve I will ask the pulmonary team to see her again.   Tobacco abuse    The patient has continued to smoke at home. We have tried many many times to get her to stop.   Bradycardia   She had some mild bradycardia before admission but this has not been a problem since she converted back to sinus rhythm this admission   Atrial fibrillation   The patient has not had any return of atrial fibrillation since she converted soon after being admitted.   Hypoxemia    We will plan to keep her O2 sats in the 88-92% range as per the critical care recommendation.   Pulmonary edema    The patient did present with volume overload. I suspect this is combination of her pulmonary status and atrial fibrillation. Her LV function remains good by echo done July 28, 2011. I will continue diuresis.   Hypokalemia   Patient will be given more potassium.   H/O steroid therapy   Patient is on steroids with a taper.  Overall the patient is improving. She is very weak and has a long way to go. She is stable enough to be transferred to telemetry.   Willa Rough 07/29/2011 7:55 AM

## 2011-07-29 NOTE — Clinical Documentation Improvement (Signed)
RESPIRATORY FAILURE DOCUMENTATION CLARIFICATION QUERY   THIS DOCUMENT IS NOT A PERMANENT PART OF THE MEDICAL RECORD  TO RESPOND TO THE THIS QUERY, FOLLOW THE INSTRUCTIONS BELOW:  1. If needed, update documentation for the patient's encounter via the notes activity.  2. Access this query again and click edit on the In [employer].  3. After updating, or not, click F2 to complete all highlighted (required) fields concerning your review. Select "additional documentation in the medical record" OR "no additional documentation provided".  4. Click Sign note button.  5. The deficiency will fall out of your In Basket *Please let us know if you are not able to complete this workflow by phone or e-mail (listed below).  Please update your documentation within the medical record to reflect your response to this query.                                                                                    07/29/11  Dear [name],  In a better effort to capture your patient's severity of illness, reflect appropriate length of stay and utilization of resources, a review of the patient medical record has revealed the following indicators.   Based on your clinical judgment, please clarify and document in a progress note and/or discharge summary the clinical condition associated with the following supporting information: In responding to this query please exercise your independent judgment.  The fact that a query is asked, does not imply that any particular answer is desired or expected.   "COPD" and "home 02" is documented throughout the progress notes. Please consider the below (if your clinical findings/judgment agree) as you document the patient's diagnosis/condition(s) in the progress note and discharge summary. Thank you!  Possible Clinical Conditions?  - Chronic Respiratory Failure, Oxygen Dependent  - Other condition (please document in the progress notes and/or discharge summary)  -  Cannot Clinically determine at this time    Supporting Information:  - H&P "extensive tobacco use resulting in oxygen-dependent COPD"; "Cigarette smoker (active)"  - H&P "Inhaled corticosteroids/bronchodilators per her home regimen, together with daily Tiotropium - PRN nebulizer therapy depending on her respiratory comfort - Short course of Prednisone 40 mg PO Daily for 5 days"     Reviewed:   Thank You,  Saul Fordyce  Clinical Documentation Specialist: [phone] Pager  Health Information Management [facility]   I have updated the chart with a progress note concerning this issue today thank you

## 2011-07-29 NOTE — Progress Notes (Addendum)
ANTICOAGULATION CONSULT NOTE - Follow Up Consult  Pharmacy Consult for Heparin Indication: atrial fibrillation  Allergies  Allergen Reactions  . Codeine   . Penicillins   . Ranitidine Hcl   . Sulfonamide Derivatives     REACTION: nausea   Vital Signs: Temp: 97.8 F (36.6 C) (04/03 0747) Temp src: Oral (04/03 0747) BP: 166/72 mmHg (04/03 0747) Pulse Rate: 86  (04/03 0747)  Labs:  Basename 07/29/11 0530 07/28/11 2337 07/28/11 1458 07/28/11 0515 07/27/11 0313 07/26/11 1946  HGB 12.3 -- -- 12.3 -- --  HCT 34.3* -- -- 35.1* -- 39.1  PLT 297 -- -- 275 -- 248  APTT -- -- -- -- -- --  LABPROT -- -- -- -- -- --  INR -- -- -- -- -- --  HEPARINUNFRC 0.63 0.37 0.24* -- -- --  CREATININE 0.67 -- -- 0.62 0.48* --  CKTOTAL -- -- -- -- -- --  CKMB -- -- -- -- -- --  TROPONINI -- -- -- -- -- --   Estimated Creatinine Clearance: 63.7 ml/min (by C-G formula based on Cr of 0.67).  Medications:  Heparin @ 1600 units/hr  Assessment: 81yof continues on heparin with a therapeutic heparin level. No bleeding noted per chart notes. Holding sinus rhythm.  Decision made to not begin coumadin in light of past GI bleed with AVMs.   Goal of Therapy:  Heparin level 0.3-0.7 units/ml   Plan:  1) Continue heparin at 1600 units/hr 2) Follow up heparin level in AM  Fredrik Rigger 07/29/2011,9:17 AM  Orders received to d/c heparin and change to lovenox DVT prophylaxis. Will dose lovenox 40mg  sq q24h. Pharmacy will sign off. Please consult for further assistance if needed.  Gentry Roch Clinical Pharmacist Pager: [phone]  Patient now back in afib and Dr. Myrtis Ser wants to resume IV heparin. Will d/c lovenox and resume heparin at 1600 units/hr.  Gentry Roch Clinical Pharmacist Pager: [phone]

## 2011-07-29 NOTE — Progress Notes (Addendum)
Patient ID: [name], female   DOB: February 02, 1930, 78 y.o.   MRN: [id number] The purpose of this note is to document that the patient has chronic respiratory failure that he is O2 dependent. She has smoked over the years and continues to smoke. She uses oxygen at home. She has severe COPD. She uses inhaled medicines. She is on a course of steroids. She needs when necessary nebulized medicines. Jerral Bonito, MD

## 2011-07-30 LAB — CBC
Hemoglobin: 12.2 g/dL (ref 12.0–15.0)
MCH: 29.5 pg (ref 26.0–34.0)
MCHC: 34.1 g/dL (ref 30.0–36.0)
MCV: 86.5 fL (ref 78.0–100.0)
RBC: 4.14 MIL/uL (ref 3.87–5.11)

## 2011-07-30 LAB — BASIC METABOLIC PANEL
BUN: 22 mg/dL (ref 6–23)
CO2: 33 mEq/L — ABNORMAL HIGH (ref 19–32)
Calcium: 9.9 mg/dL (ref 8.4–10.5)
Creatinine, Ser: 0.83 mg/dL (ref 0.50–1.10)
GFR calc non Af Amer: 65 mL/min — ABNORMAL LOW (ref >90)
Glucose, Bld: 109 mg/dL — ABNORMAL HIGH (ref 70–99)

## 2011-07-30 MED ORDER — SODIUM CHLORIDE 0.9 % IV SOLN
INTRAVENOUS | Status: DC
  Administered 2011-07-30: 10 mL/h via INTRAVENOUS

## 2011-07-30 MED ORDER — DILTIAZEM HCL ER COATED BEADS 180 MG PO CP24
180.0000 mg | ORAL_CAPSULE | Freq: Every day | ORAL | Status: DC
  Administered 2011-07-30 – 2011-08-04 (×6): 180 mg via ORAL
  Filled 2011-07-30 (×7): qty 1

## 2011-07-30 MED ORDER — POTASSIUM CHLORIDE CRYS ER 20 MEQ PO TBCR
40.0000 meq | EXTENDED_RELEASE_TABLET | Freq: Once | ORAL | Status: AC
  Administered 2011-07-30: 40 meq via ORAL

## 2011-07-30 MED ORDER — FUROSEMIDE 10 MG/ML IJ SOLN
40.0000 mg | Freq: Two times a day (BID) | INTRAMUSCULAR | Status: DC
  Administered 2011-07-30 – 2011-08-01 (×5): 40 mg via INTRAVENOUS
  Filled 2011-07-30 (×7): qty 4

## 2011-07-30 NOTE — Progress Notes (Signed)
Patient ID: [name], female   DOB: Mar 04, 1930, 78 y.o.   MRN: [id number]   SUBJECTIVE:  I had hoped to patient to telemetry yesterday. However she had marked acute shortness of breath again with some atrial fibrillation. He remains difficult to know which comes first the burst of atrial fibrillation or the worsening of her breathing status. She has diuresed. Her chest x-ray from yesterday raised the question of small effusions. We will continue diuresis. She does have some wheezing. She has ongoing pulmonary issues.   Filed Vitals:   07/30/11 0437 07/30/11 0500 07/30/11 0700 07/30/11 0754  BP:  130/55 159/68   Pulse:  62 61   Temp: 98.2 F (36.8 C)   97.4 F (36.3 C)  TempSrc: Oral   Oral  Resp:  21 20   Height:      Weight:      SpO2:  99% 97%     Intake/Output Summary (Last 24 hours) at 07/30/11 0824 Last data filed at 07/30/11 0700  Gross per 24 hour  Intake   1404 ml  Output   1100 ml  Net    304 ml    LABS: Basic Metabolic Panel:  Basename 07/30/11 0613 07/29/11 0530  NA 129* 124*  K 3.4* 3.4*  CL 89* 84*  CO2 33* 30  GLUCOSE 109* 96  BUN 22 16  CREATININE 0.83 0.67  CALCIUM 9.9 9.8  MG -- --  PHOS -- --   Liver Function Tests: No results found for this basename: AST:2,ALT:2,ALKPHOS:2,BILITOT:2,PROT:2,ALBUMIN:2 in the last 72 hours No results found for this basename: LIPASE:2,AMYLASE:2 in the last 72 hours CBC:  Basename 07/30/11 0613 07/29/11 0530  WBC 16.4* 14.6*  NEUTROABS -- --  HGB 12.2 12.3  HCT 35.8* 34.3*  MCV 86.5 84.5  PLT 353 297   Cardiac Enzymes: No results found for this basename: CKTOTAL:3,CKMB:3,CKMBINDEX:3,TROPONINI:3 in the last 72 hours BNP: No components found with this basename: POCBNP:3 D-Dimer: No results found for this basename: DDIMER:2 in the last 72 hours Hemoglobin A1C: No results found for this basename: HGBA1C in the last 72 hours Fasting Lipid Panel: No results found for this basename:  CHOL,HDL,LDLCALC,TRIG,CHOLHDL,LDLDIRECT in the last 72 hours Thyroid Function Tests: No results found for this basename: TSH,T4TOTAL,FREET3,T3FREE,THYROIDAB in the last 72 hours  RADIOLOGY: Dg Chest 2 View  07/29/2011  *RADIOLOGY REPORT*  Clinical Data: 78 year old female with pulmonary edema, bronchitis, shortness of breath.  CHEST - 2 VIEW  Comparison: 07/26/2011 and earlier.  Findings: Chronically large lung volumes. Stable cardiomegaly and mediastinal contours.  Stable biapical scarring.  Increased AP dimension to the chest and exaggerated thoracic kyphosis.  Sequelae of CABG.  No pneumothorax, pulmonary edema or consolidation. Possible small effusions.  Grossly stable osseous structures.  IMPRESSION: Chronic lung disease and cardiomegaly.  Possible small effusions. No acute pneumonia identified.  Original Report Authenticated By: Harley Hallmark, M.D.   Dg Chest Port 1 View  07/26/2011  *RADIOLOGY REPORT*  Clinical Data: Chest pain and shortness of breath.  PORTABLE CHEST - 1 VIEW  Comparison: 09/01/2010.  Findings: The cardiac silhouette, mediastinal and hilar contours are within normal limits and stable.  Stable surgical changes from bypass surgery.  Stable emphysematous changes and pulmonary scarring.  No definite acute overlying pulmonary process.  IMPRESSION: Chronic emphysematous changes and pulmonary scarring.  No definite acute overlying pulmonary process.  Original Report Authenticated By: P. Loralie Champagne, M.D.    PHYSICAL EXAM  Patient is oriented to person time and place. Affect  is normal. Lungs reveal wheezing. There is no respiratory distress. Cardiac exam reveals S1-S2. Heart sounds are distant. Abdomen is soft. There is no significant peripheral edema.   TELEMETRY:   She is back in sinus rhythm today. I have reviewed telemetry.   ASSESSMENT AND PLAN:   OXYGEN-USE OF SUPPLEMENTAL   CAD (coronary artery disease) Coronary disease is stable.   Aortic valve sclerosis   Hypertension  Hyperlipidemia   GI bleed Her GI bleed was in the past. There's been no bleeding this admission and her hemoglobin is stable.   Hyponatremia Sodium is a little better today at 129. We'll continue to follow.   COPD (chronic obstructive pulmonary disease)   The patient continues to cough greenish sputum. She is on medications. I will ask the pulmonary critical care team to look at her again today to see if we should adjust her meds any further.   Tobacco abuse  Bradycardia   Atrial fibrillation   I suspect she will not do well if she has recurrent atrial fibrillation. With her terrible lung disease I am hesitant to use amiodarone but it still may be the best drug for this patient. I will asked critical care medicine team if they feel I can use amiodarone in her setting.she is on IV Cardizem. I will change her to by mouth.   Hypoxemia   Pulmonary edema  Chest x-ray revealed question of some effusions. I will continue to push for diuresis.   Hypokalemia Patient is receiving potassium daily. Give a small extra dose.   H/O steroid therapy  Patient is receiving steroids with taper. I'll ask critical care medicine if we should use higher doses for a longer.   Chronic respiratory failure  The patient has severe chronic respiratory failure. Her acute worsening is being treated.   Willa Rough 07/30/2011 8:24 AM

## 2011-07-30 NOTE — Progress Notes (Signed)
ANTICOAGULATION CONSULT NOTE - Follow Up Consult  Pharmacy Consult for Heparin Indication: atrial fibrillation  Allergies  Allergen Reactions  . Codeine   . Penicillins   . Ranitidine Hcl   . Sulfonamide Derivatives     REACTION: nausea   Vital Signs: Temp: 98.2 F (36.8 C) (04/04 0437) Temp src: Oral (04/04 0437) BP: 159/68 mmHg (04/04 0700) Pulse Rate: 61  (04/04 0700)  Labs:  Basename 07/30/11 4098 07/29/11 0530 07/28/11 2337 07/28/11 0515  HGB 12.2 12.3 -- --  HCT 35.8* 34.3* -- 35.1*  PLT 353 297 -- 275  APTT -- -- -- --  LABPROT -- -- -- --  INR -- -- -- --  HEPARINUNFRC 0.84* 0.63 0.37 --  CREATININE 0.83 0.67 -- 0.62  CKTOTAL -- -- -- --  CKMB -- -- -- --  TROPONINI -- -- -- --   Estimated Creatinine Clearance: 61.4 ml/min (by C-G formula based on Cr of 0.83).  Medications:  Heparin @ 1600 units/hr  Assessment: [name] continues on IV heparin for atrial fibrillation. Heparin level (0.84) is above-goal on 1600 units/hr. Confirmed with RN that lab was drawn from arm opposite from heparin infusion.   Goal of Therapy:  Heparin level 0.3-0.7 units/ml   Plan:  1) Decrease heparin to 1450 units/hr 2) Heparin level in 8 hours.   Emeline Gins, PharmD 07/30/2011,7:31 AM

## 2011-07-30 NOTE — Progress Notes (Signed)
ANTICOAGULATION CONSULT NOTE - Follow Up Consult  Pharmacy Consult for Heparin Indication: atrial fibrillation  Allergies  Allergen Reactions  . Codeine   . Penicillins   . Ranitidine Hcl   . Sulfonamide Derivatives     REACTION: nausea   Vital Signs: Temp: 98.3 F (36.8 C) (04/04 1222) Temp src: Oral (04/04 1222) BP: 143/52 mmHg (04/04 1500) Pulse Rate: 70  (04/04 1500)  Labs:  Basename 07/30/11 1506 07/30/11 0613 07/29/11 0530 07/28/11 0515  HGB -- 12.2 12.3 --  HCT -- 35.8* 34.3* 35.1*  PLT -- 353 297 275  APTT -- -- -- --  LABPROT -- -- -- --  INR -- -- -- --  HEPARINUNFRC 0.52 0.84* 0.63 --  CREATININE -- 0.83 0.67 0.62  CKTOTAL -- -- -- --  CKMB -- -- -- --  TROPONINI -- -- -- --   Estimated Creatinine Clearance: 61.4 ml/min (by C-G formula based on Cr of 0.83).  Medications:  Heparin @ 1450 units/hr  Assessment: 81yof continues on heparin with a therapeutic heparin level after rate decreased. No bleeding noted per chart notes. Diltiazem changed to po for rate control. Cardiology considering adding amiodarone but hesitant with patient's lung disease.  Goal of Therapy:  Heparin level 0.3-0.7 units/ml   Plan:  1) Continue heparin at 1450 units/hr 2) Follow up heparin level in AM  Fredrik Rigger 07/30/2011,4:02 PM

## 2011-07-30 NOTE — Progress Notes (Signed)
Aren Cherne, PT [phone]  

## 2011-07-30 NOTE — Progress Notes (Signed)
OT Cancellation Note  Treatment cancelled today due to medical issues with patient which prohibited therapy. Pt just had a "coughing spell" and is worn out. Will see at a later time as schedule allows (today or tomorrow).  [name] [phone] 07/30/2011, 11:37 AM

## 2011-07-31 DIAGNOSIS — J9610 Chronic respiratory failure, unspecified whether with hypoxia or hypercapnia: Secondary | ICD-10-CM

## 2011-07-31 LAB — BASIC METABOLIC PANEL
BUN: 24 mg/dL — ABNORMAL HIGH (ref 6–23)
CO2: 31 mEq/L (ref 19–32)
Chloride: 90 mEq/L — ABNORMAL LOW (ref 96–112)
Creatinine, Ser: 0.8 mg/dL (ref 0.50–1.10)
GFR calc Af Amer: 79 mL/min — ABNORMAL LOW (ref >90)
Glucose, Bld: 131 mg/dL — ABNORMAL HIGH (ref 70–99)
Potassium: 4.4 mEq/L (ref 3.5–5.1)

## 2011-07-31 LAB — CBC
HCT: 36 % (ref 36.0–46.0)
MCH: 30.9 pg (ref 26.0–34.0)
MCV: 88.2 fL (ref 78.0–100.0)
RBC: 4.08 MIL/uL (ref 3.87–5.11)
WBC: 16.5 10*3/uL — ABNORMAL HIGH (ref 4.0–10.5)

## 2011-07-31 LAB — GLUCOSE, CAPILLARY
Glucose-Capillary: 111 mg/dL — ABNORMAL HIGH (ref 70–99)
Glucose-Capillary: 206 mg/dL — ABNORMAL HIGH (ref 70–99)
Glucose-Capillary: 245 mg/dL — ABNORMAL HIGH (ref 70–99)

## 2011-07-31 MED ORDER — ENOXAPARIN SODIUM 40 MG/0.4ML ~~LOC~~ SOLN
40.0000 mg | Freq: Every day | SUBCUTANEOUS | Status: DC
  Administered 2011-07-31 – 2011-08-03 (×4): 40 mg via SUBCUTANEOUS
  Filled 2011-07-31 (×5): qty 0.4

## 2011-07-31 MED ORDER — LEVALBUTEROL TARTRATE 45 MCG/ACT IN AERO
2.0000 | INHALATION_SPRAY | Freq: Two times a day (BID) | RESPIRATORY_TRACT | Status: DC
  Administered 2011-07-31 – 2011-08-04 (×8): 2 via RESPIRATORY_TRACT
  Filled 2011-07-31: qty 15

## 2011-07-31 NOTE — Progress Notes (Signed)
Clinical Social Work Department BRIEF PSYCHOSOCIAL ASSESSMENT 07/31/2011  Patient:  [name], [name]     Account Number:  [id number]     Admit date:  07/26/2011  Clinical Social Worker:  Hulan Fray  Date/Time:  07/31/2011 03:02 PM  Referred by:  CSW  Date Referred:  07/31/2011 Referred for  SNF Placement   Other Referral:   Interview type:  Patient Other interview type:    PSYCHOSOCIAL DATA Living Status:  ALONE Admitted from facility:   Level of care:   Primary support name:  Jannet Askew Primary support relationship to patient:  FAMILY Degree of support available:   Supportive    CURRENT CONCERNS Current Concerns  Post-Acute Placement   Other Concerns:    SOCIAL WORK ASSESSMENT / PLAN Clinical Social Worker saw PT's recommendation for short term skilled nursing placement. CSW met with patient to discuss this option. Patient lives alone, but her son, [name], and daughter-in-law, [name], live on the same property. [name] suffers from Emphysema and is on oxygen.  [name] takes care of both the patient and [name]. Patient reports that [name] buys the groceries and checks on her in the morning and the evening. 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 CSW provided patient with SNF packet. CSW will follow up over the weekend regarding disposition needs.   Assessment/plan status:  Psychosocial Support/Ongoing Assessment of Needs Other assessment/ plan:   Information/referral to community resources:   SNF packet    PATIENT'S/FAMILY'S RESPONSE TO PLAN OF CARE: Patient wishes to speak with her daughter-in-law and the MD regarding this option for discharge disposition. Patient stated that she would prefer [facility] because it is closer to her house and for family to visit. Patient appears to be considering this option of short term SNF placement.

## 2011-07-31 NOTE — Progress Notes (Signed)
HPI:  The patient is a 78 yo female with O2 dependant COPD admitted on 07/26/11 for SOB, she was also found to have atrial fib that was new for her. Since that time she's converted to sinus rhythm. However her pulmonary status remains unstable. She got up to go to the bathroom on 4/1 and on her way back to bed she experienced severe shortness of breath. Her O2 sat was >91% at that time with HR in 70's. Patient reports feeling sick for several days with productive cough and chills at home.   Lines / Drains:  None   Cultures:  None   Antibiotics:  Azithromycin 3/31 >>   Subjective: Patient laying comfortably in bed, in no acute distress, reports persistent SOB, but this has improved since yesterday. Saturating well on 2l [place].   Physical Exam: Filed Vitals:   07/31/11 1200  BP:   Pulse: 55  Temp:   Resp: 17    Intake/Output Summary (Last 24 hours) at 07/31/11 1417 Last data filed at 07/31/11 1200  Gross per 24 hour  Intake   1210 ml  Output   2535 ml  Net  -1325 ml   Vent Mode:  [-]  FiO2 (%):  [2 %] 2 % General: alert, well-developed, and cooperative to examination.  Lungs: CTA B Heart: normal rate, regular rhythm, no murmur, no gallop, and no rub.  Abdomen: soft, non-tender, normal bowel sounds, no distention, no guarding, no rebound tenderness, no hepatomegaly, and no splenomegaly.  Pulses: 2+ DP/PT pulses bilaterally Extremities: No cyanosis, clubbing, edema  Neurologic: alert & oriented X3,    Labs: CBC:    Component Value Date/Time   WBC 16.5* 07/31/2011 0555   HGB 12.6 07/31/2011 0555   HCT 36.0 07/31/2011 0555   PLT 384 07/31/2011 0555   MCV 88.2 07/31/2011 0555   NEUTROABS 11.3* 07/26/2011 1946   LYMPHSABS 1.0 07/26/2011 1946   MONOABS 1.4* 07/26/2011 1946   EOSABS 0.1 07/26/2011 1946   BASOSABS 0.0 07/26/2011 1946     Basic Metabolic Panel:    Component Value Date/Time   NA 129* 07/31/2011 0555   K 4.4 07/31/2011 0555   CL 90* 07/31/2011 0555   CO2 31 07/31/2011 0555   BUN 24* 07/31/2011 0555   CREATININE 0.80 07/31/2011 0555   GLUCOSE 131* 07/31/2011 0555   CALCIUM 10.1 07/31/2011 0555   Comprehensive Metabolic Panel:    Component Value Date/Time   NA 129* 07/31/2011 0555   K 4.4 07/31/2011 0555   CL 90* 07/31/2011 0555   CO2 31 07/31/2011 0555   BUN 24* 07/31/2011 0555   CREATININE 0.80 07/31/2011 0555   GLUCOSE 131* 07/31/2011 0555   CALCIUM 10.1 07/31/2011 0555   AST 18 08/20/2007 1405   ALT 16 08/20/2007 1405   ALKPHOS 44 08/20/2007 1405   BILITOT 0.5 08/20/2007 1405   PROT 6.0 08/20/2007 1405   ALBUMIN 3.6 08/20/2007 1405   ABG    Component Value Date/Time   TCO2 29 09/25/2010 1431    Lab 07/27/11 0313  MG 1.6   Lab Results  Component Value Date   CALCIUM 10.1 07/31/2011   Assessment & Plan:  COPD In the setting of productive cough and symptomatic worsening of her SOB for the past week likely related to URI.  - Continue O2 via [place].  - Azithromycin course complete.  - Continue inhalers and nbs  - No further indication for steroids. - Lasix 40mg  PO BID  - Continue Mucinex BID  -  Tobacco cessation counseling.  - Add IS and flutter valve. - Patient is back on her home O2 level.  There is little else to be offered to her short of maintaining dry.  This is not amiodarone lung.  Hyponatremia Calculated serum osmolality 267, differential for hypo-osmolar hyponatremia include volume depletion, hormonal changes, such adrenal insufficiency, hypothyroidism (however TSH was WNL on 07/27/11), and ectopic production of atrial natriuretic peptide. Given the patients physical exam findings, history of COPD, and chronicity of her hyponatremia she likely has SIADH. Fluid restrict and administer lasix 40mg  PO BID.   Hypokalemia More stable, recheck.  Hypertension Persistently elevated, increase lisinopril to 20mg  daily.   Will see again on Monday.  Alyson Reedy, M.D. [facility] Pulmonary/Critical Care Medicine. Pager: [phone]. After hours pager: [phone].

## 2011-07-31 NOTE — Progress Notes (Signed)
Patient ID: [name], female   DOB: [birth year], 78 y.o.   MRN: [id number]   SUBJECTIVE:  The patient clearly looks better today. She continues to have significant coughing with sputum production. Some of her coughing spells can be quite marked. However overall she is getting stronger. She continues to diuresis her renal function is remaining stable. Her potassium has been normalized. She still has a Foley in. She feels that she cannot yet manage getting up to the bedside commode on a regular basis to urinate. We need to decide daily about the continuation of her Foley.   Filed Vitals:   07/31/11 0323 07/31/11 0400 07/31/11 0556 07/31/11 0801  BP:  170/63  149/74  Pulse:  64  75  Temp: 97.8 F (36.6 C)   97.9 F (36.6 C)  TempSrc: Oral   Oral  Resp:  22  21  Height:      Weight:   168 lb 14 oz (76.6 kg)   SpO2:  95%  95%    Intake/Output Summary (Last 24 hours) at 07/31/11 0825 Last data filed at 07/31/11 0700  Gross per 24 hour  Intake 1700.28 ml  Output   2635 ml  Net -934.72 ml    LABS: Basic Metabolic Panel:  Basename 07/31/11 0555 07/30/11 0613  NA 129* 129*  K 4.4 3.4*  CL 90* 89*  CO2 31 33*  GLUCOSE 131* 109*  BUN 24* 22  CREATININE 0.80 0.83  CALCIUM 10.1 9.9  MG -- --  PHOS -- --   Liver Function Tests: No results found for this basename: AST:2,ALT:2,ALKPHOS:2,BILITOT:2,PROT:2,ALBUMIN:2 in the last 72 hours No results found for this basename: LIPASE:2,AMYLASE:2 in the last 72 hours CBC:  Basename 07/31/11 0555 07/30/11 0613  WBC 16.5* 16.4*  NEUTROABS -- --  HGB 12.6 12.2  HCT 36.0 35.8*  MCV 88.2 86.5  PLT 384 353   Cardiac Enzymes: No results found for this basename: CKTOTAL:3,CKMB:3,CKMBINDEX:3,TROPONINI:3 in the last 72 hours BNP: No components found with this basename: POCBNP:3 D-Dimer: No results found for this basename: DDIMER:2 in the last 72 hours Hemoglobin A1C: No results found for this basename: HGBA1C in the last 72  hours Fasting Lipid Panel: No results found for this basename: CHOL,HDL,LDLCALC,TRIG,CHOLHDL,LDLDIRECT in the last 72 hours Thyroid Function Tests: No results found for this basename: TSH,T4TOTAL,FREET3,T3FREE,THYROIDAB in the last 72 hours  RADIOLOGY: Dg Chest 2 View  07/29/2011  *RADIOLOGY REPORT*  Clinical Data: 78 year old female with pulmonary edema, bronchitis, shortness of breath.  CHEST - 2 VIEW  Comparison: 07/26/2011 and earlier.  Findings: Chronically large lung volumes. Stable cardiomegaly and mediastinal contours.  Stable biapical scarring.  Increased AP dimension to the chest and exaggerated thoracic kyphosis.  Sequelae of CABG.  No pneumothorax, pulmonary edema or consolidation. Possible small effusions.  Grossly stable osseous structures.  IMPRESSION: Chronic lung disease and cardiomegaly.  Possible small effusions. No acute pneumonia identified.  Original Report Authenticated By: Harley Hallmark, M.D.   Dg Chest Port 1 View  07/26/2011  *RADIOLOGY REPORT*  Clinical Data: Chest pain and shortness of breath.  PORTABLE CHEST - 1 VIEW  Comparison: 09/01/2010.  Findings: The cardiac silhouette, mediastinal and hilar contours are within normal limits and stable.  Stable surgical changes from bypass surgery.  Stable emphysematous changes and pulmonary scarring.  No definite acute overlying pulmonary process.  IMPRESSION: Chronic emphysematous changes and pulmonary scarring.  No definite acute overlying pulmonary process.  Original Report Authenticated By: P. Loralie Champagne, M.D.  PHYSICAL EXAM  Patient is oriented to person time and place. Affect is normal. There is no jugulovenous distention. Lung exam reveals diffuse decreased breath sounds. I do not hear any marked wheezing this morning. There is no respiratory distress. She continues to cough and bring up significant sputum. Cardiac exam reveals an S1-S2 with a soft systolic murmur. The rhythm is regular. The abdomen is soft. She has no  significant peripheral edema.   TELEMETRY: I have reviewed to telemetry. There is normal sinus rhythm.   ASSESSMENT AND PLAN:   OXYGEN-USE OF SUPPLEMENTAL   Patient uses oxygen at home the plan will be for her to go home with her oxygen.   CAD (coronary artery disease)   Coronary disease has been stable this admission despite the severity of her fluid overload and pulmonary process. No further workup.   Hypertension   Blood pressure is reasonably controlled at this time. No change in therapy.   Hyperlipidemia    Lipids are being treated.   GI bleed    She has not had any GI bleeding this admission. She is on IV heparin. Because of her significant bleeding in the past we have decided not to use Coumadin. She is stable enough now to convert her to DVT prophylaxis dosing of anticoagulants.   Hyponatremia   Her sodium was up to 129. This is actually good for her. No change in the overall therapy.   COPD (chronic obstructive pulmonary disease)    Patient has significant disease with an acute exacerbation of her lung disease this admission. In my note yesterday I mentioned asking critical care medicine to see her again to answer several questions. I will look into reaching them today. I need to know if amiodarone can be used if we have to use it. I also need to know if we should adjust her steroid dosing or keep the current taper. The third question is whether or not any other adjustments are needed in her meds with her persistent major cough and sputum production.   Tobacco abuse   Patient has never been willing to stop smoking.   Bradycardia   We have not seen any major bradycardia this admission.   Atrial fibrillation    She has not had any recurrent atrial fibrillation in the last 24 hours. She tends to have episodes here in the hospital when she has shortness of breath or major coughing. I continue to hope that we will not need any other medications. However I'm trying to check  with the pulmonary team to see if we can use amiodarone if needed. The patient is tolerating the oral diltiazem that I started yesterday. As well help with rate control if she reverts to atrial fib. This dose can be adjusted over time.   Hypoxemia   Pulmonary edema   The patient continues to diuresis and her overall volume status improves. Echo has been checked this admission and she has good LV function.   Hypokalemia   The patient is receiving daily potassium. In addition I gave her extra potassium yesterday. Her potassium increased from 3.4-4.4. I have left her on the daily potassium. We need to be sure tomorrow that she does not have continued increase in which case she would need adjustment in her daily meds.   H/O steroid therapy   The patient is on steroids that were ordered as a taper. Once again I will be asking critical care medicine if we should use longer dosing of her steroids  Chronic respiratory failure   Willa Rough 07/31/2011 8:25 AM

## 2011-07-31 NOTE — Evaluation (Signed)
Occupational Therapy Evaluation Patient Details Name: [name] MRN: [id number] DOB: 05-22-1929 Today's Date: 07/31/2011  Problem List:  Patient Active Problem List  Diagnoses  . OXYGEN-USE OF SUPPLEMENTAL  . CAD (coronary artery disease)  . Aortic valve sclerosis  . Hypertension  . Hyperlipidemia  . GI bleed  . Aneurysm, aortic  . Syncope  . Hyponatremia  . COPD (chronic obstructive pulmonary disease)  . Tobacco abuse  . Bradycardia  . Hx of CABG  . Ejection fraction  . Carotid artery disease  . Tuberculosis  . Edema  . Atrial fibrillation  . Hypoxemia  . Pulmonary edema  . Hypokalemia  . H/O steroid therapy  . Chronic respiratory failure    Past Medical History:  Past Medical History  Diagnosis Date  . CAD (coronary artery disease)   . Aortic valve sclerosis   . Hypertension   . Hyperlipidemia   . GI bleed     AVMs  . Aneurysm, aortic     thoracic aorta, stable at 4.1 cm, chest CT, May, 2012  . Syncope     Nitroglycerin plus a diuretic, April, 2009  . Hyponatremia   . COPD (chronic obstructive pulmonary disease)   . Tobacco abuse   . Bradycardia   . Hx of CABG     2005  . Ejection fraction     EF 60-65%, echo, July, 2010  . Carotid artery disease     Doppler, October, 2011, stable, 0-39% RIC A., 40-59% LICA  . Tuberculosis     Exposures to tuberculosis 1970s, has tested negative by the health Department  . Edema     July, 2012  . Atrial fibrillation     New diagnosis July 27, 2011 with COPD exacerbation   Past Surgical History:  Past Surgical History  Procedure Date  . Cholecystectomy   . Carotid endartercetomy   . Abdominal hysterectomy   . Coronary artery bypass graft 2005    OT Assessment/Plan/Recommendation OT Assessment Clinical Impression Statement: 78 yo admitted s/p respiratory issues with decr mobility due to decr endurance for activity. Ot to follow acutely OT Recommendation/Assessment: Patient will need skilled OT in the  acute care venue OT Problem List: Decreased strength;Decreased activity tolerance;Impaired balance (sitting and/or standing);Decreased knowledge of use of DME or AE OT Therapy Diagnosis : Generalized weakness OT Plan OT Frequency: Min 2X/week OT Treatment/Interventions: Self-care/ADL training;Cognitive remediation/compensation;Therapeutic activities;Balance training;Patient/family education OT Recommendation Follow Up Recommendations: Home health OT Equipment Recommended: Defer to next venue Individuals Consulted Consulted and Agree with Results and Recommendations: Patient OT Goals Acute Rehab OT Goals OT Goal Formulation: With patient Time For Goal Achievement: 2 weeks ADL Goals Pt Will Perform Grooming: with modified independence;Supported;Sitting at sink ADL Goal: Grooming - Progress: Goal set today Pt Will Perform Upper Body Bathing: with set-up;Sit to stand from chair;Sit to stand from bed ADL Goal: Upper Body Bathing - Progress: Goal set today Pt Will Perform Lower Body Bathing: with set-up;Sit to stand from chair;Sit to stand from bed ADL Goal: Lower Body Bathing - Progress: Goal set today Pt Will Transfer to Toilet: with set-up;3-in-1 ADL Goal: Toilet Transfer - Progress: Goal set today Pt Will Perform Toileting - Clothing Manipulation: with set-up;Sitting on 3-in-1 or toilet ADL Goal: Toileting - Clothing Manipulation - Progress: Goal set today Pt Will Perform Toileting - Hygiene: with set-up;Sit to stand from 3-in-1/toilet ADL Goal: Toileting - Hygiene - Progress: Goal set today  OT Evaluation Precautions/Restrictions  Precautions Precautions: Fall Restrictions Weight Bearing Restrictions: No Prior  Functioning Home Living Lives With: Alone Receives Help From:  (daughter in law in PM hours) Type of Home: House Home Layout: One level Home Access: Ramped entrance Bathroom Shower/Tub: Engineer, manufacturing systems: Standard Bathroom Accessibility: Yes How  Accessible: Accessible via walker Home Adaptive Equipment: Walker - standard;Quad cane;Shower chair with back;Bedside commode/3-in-1 Prior Function Level of Independence: Independent with basic ADLs;Independent with homemaking with ambulation;Independent with gait;Independent with transfers Driving: Yes Vocation: Retired ADL ADL Grooming: Simulated;Wash/dry face;Wash/dry hands;Set up Where Assessed - Grooming: Sitting, chair;Supported Lower Body Dressing: Simulated;Maximal assistance Where Assessed - Lower Body Dressing: Sit to stand from chair;Sit to stand from bed Toilet Transfer: Simulated;Minimal assistance Toilet Transfer Method: Stand pivot Toilet Transfer Equipment: Raised toilet seat with arms (or 3-in-1 over toilet) ADL Comments: Pt fatigued from sitting in chair this Afternoon and requesting back to bed only. Pt demonstrates AROM BIL UE WFL. Pt completed basic transfer.  (pt very deconditioned could benefit from Energy conservation) Vision/Perception  Vision - History Baseline Vision: Wears glasses all the time Patient Visual Report:  (no glasses present) Cognition Cognition Arousal/Alertness: Awake/alert Overall Cognitive Status: Appears within functional limits for tasks assessed Sensation/Coordination Sensation Light Touch: Appears Intact Coordination Gross Motor Movements are Fluid and Coordinated: Yes Fine Motor Movements are Fluid and Coordinated: Yes Extremity Assessment RUE Assessment RUE Assessment: Within Functional Limits LUE Assessment LUE Assessment: Within Functional Limits Mobility  Bed Mobility Bed Mobility: Yes Rolling Right: 4: Min assist;With rail Right Sidelying to Sit: 4: Min assist;With rails Right Sidelying to Sit Details (indicate cue type and reason): cues for technique Sit to Supine: Other (comment) (min guard (A)) Transfers Transfers: Yes Sit to Stand: 4: Min assist;From elevated surface;With upper extremity assist;From bed Sit to  Stand Details (indicate cue type and reason): cues for hand placement Stand to Sit: 4: Min assist;With upper extremity assist;With armrests;To chair/3-in-1 Stand to Sit Details: cues for hand placement Exercises   End of Session OT - End of Session Equipment Utilized During Treatment: Gait belt Activity Tolerance: Patient tolerated treatment well Patient left: in bed;with call bell in reach Nurse Communication: Mobility status for transfers General Behavior During Session: [facility] for tasks performed Cognition: [facility] for tasks performed   Lucile Shutters 07/31/2011, 3:41 PM  Pager: [phone]

## 2011-07-31 NOTE — Progress Notes (Signed)
Inpatient Diabetes Program Recommendations  AACE/ADA: New Consensus Statement on Inpatient Glycemic Control (2009)  Target Ranges:  Prepandial:   less than 140 mg/dL      Peak postprandial:   less than 180 mg/dL (1-2 hours)      Critically ill patients:  140 - 180 mg/dL   Reason for Visit: Hyperglycemia  Inpatient Diabetes Program Recommendations Correction (SSI): Novolog sensitive tidwc HgbA1C: Check HgbA1C to assess glycemic control prior to admission  Note: Will follow.

## 2011-07-31 NOTE — Evaluation (Addendum)
Physical Therapy Evaluation Patient Details Name: [name] MRN: [id number] DOB: 04-20-1930 Today's Date: 07/31/2011  Problem List:  Patient Active Problem List  Diagnoses  . OXYGEN-USE OF SUPPLEMENTAL  . CAD (coronary artery disease)  . Aortic valve sclerosis  . Hypertension  . Hyperlipidemia  . GI bleed  . Aneurysm, aortic  . Syncope  . Hyponatremia  . COPD (chronic obstructive pulmonary disease)  . Tobacco abuse  . Bradycardia  . Hx of CABG  . Ejection fraction  . Carotid artery disease  . Tuberculosis  . Edema  . Atrial fibrillation  . Hypoxemia  . Pulmonary edema  . Hypokalemia  . H/O steroid therapy  . Chronic respiratory failure    Past Medical History:  Past Medical History  Diagnosis Date  . CAD (coronary artery disease)   . Aortic valve sclerosis   . Hypertension   . Hyperlipidemia   . GI bleed     AVMs  . Aneurysm, aortic     thoracic aorta, stable at 4.1 cm, chest CT, May, 2012  . Syncope     Nitroglycerin plus a diuretic, April, 2009  . Hyponatremia   . COPD (chronic obstructive pulmonary disease)   . Tobacco abuse   . Bradycardia   . Hx of CABG     2005  . Ejection fraction     EF 60-65%, echo, July, 2010  . Carotid artery disease     Doppler, October, 2011, stable, 0-39% RIC A., 40-59% LICA  . Tuberculosis     Exposures to tuberculosis 1970s, has tested negative by the health Department  . Edema     July, 2012  . Atrial fibrillation     New diagnosis July 27, 2011 with COPD exacerbation   Past Surgical History:  Past Surgical History  Procedure Date  . Cholecystectomy   . Carotid endartercetomy   . Abdominal hysterectomy   . Coronary artery bypass graft 2005    PT Assessment/Plan/Recommendation PT Assessment Clinical Impression Statement: Patient is s/p respiratory issues with decr mobility due to decr endurance for activity.  Patient reports she was too tired to work with PT initially but patient agreed with encouragement.   Performed stand pivot transfer well but could not tolerate more therapy.   PT Recommendation/Assessment: Patient will need skilled PT in the acute care venue PT Problem List: Decreased activity tolerance;Decreased balance;Decreased mobility;Decreased knowledge of use of DME;Decreased safety awareness Barriers to Discharge: Decreased caregiver support PT Therapy Diagnosis : Difficulty walking;Generalized weakness PT Plan PT Frequency: Min 3X/week PT Treatment/Interventions: DME instruction;Gait training;Functional mobility training;Therapeutic activities;Therapeutic exercise;Balance training;Patient/family education PT Recommendation Follow Up Recommendations: Skilled nursing facility;Supervision/Assistance - 24 hour (short term NHP recommended) Equipment Recommended: Defer to next venue PT Goals  Acute Rehab PT Goals PT Goal Formulation: With patient Time For Goal Achievement: 2 weeks Pt will go Supine/Side to Sit: Independently PT Goal: Supine/Side to Sit - Progress: Goal set today Pt will go Sit to Stand: with supervision;with upper extremity assist PT Goal: Sit to Stand - Progress: Goal set today Pt will go Stand to Sit: with supervision;with upper extremity assist PT Goal: Stand to Sit - Progress: Goal set today Pt will Transfer Bed to Chair/Chair to Bed: with supervision PT Transfer Goal: Bed to Chair/Chair to Bed - Progress: Goal set today Pt will Ambulate: 16 - 50 feet;with supervision;with least restrictive assistive device PT Goal: Ambulate - Progress: Goal set today Pt will Perform Home Exercise Program: Independently PT Goal: Perform Home Exercise Program -  Progress: Goal set today  PT Evaluation Precautions/Restrictions  Precautions Precautions: Fall Prior Functioning  Home Living Lives With: Alone Type of Home: House Home Layout: One level Home Access: Ramped entrance Bathroom Shower/Tub: Engineer, manufacturing systems: Standard Bathroom Accessibility: Yes How  Accessible: Accessible via walker Home Adaptive Equipment: Walker - standard;Quad cane;Shower chair with back;Bedside commode/3-in-1 Prior Function Level of Independence: Independent with basic ADLs;Independent with homemaking with ambulation;Independent with gait;Independent with transfers Driving: Yes Vocation: Retired Producer, television/film/video: Awake/alert Overall Cognitive Status: Appears within functional limits for tasks assessed Sensation/Coordination Sensation Light Touch: Appears Intact Stereognosis: Not tested Hot/Cold: Not tested Proprioception: Not tested Coordination Gross Motor Movements are Fluid and Coordinated: Yes Fine Motor Movements are Fluid and Coordinated: Yes Extremity Assessment RUE Assessment RUE Assessment: Within Functional Limits LUE Assessment LUE Assessment: Within Functional Limits RLE Assessment RLE Assessment: Within Functional Limits LLE Assessment LLE Assessment: Within Functional Limits Mobility (including Balance) Bed Mobility Bed Mobility: Yes Rolling Right: 4: Min assist;With rail Right Sidelying to Sit: 4: Min assist;With rails Right Sidelying to Sit Details (indicate cue type and reason): cues for technique Transfers Transfers: Yes Sit to Stand: 4: Min assist;From elevated surface;With upper extremity assist;From bed Sit to Stand Details (indicate cue type and reason): cues for hand placement Stand to Sit: 4: Min assist;With upper extremity assist;With armrests;To chair/3-in-1 Stand to Sit Details: cues for hand placement Stand Pivot Transfers: 4: Min assist;With armrests Stand Pivot Transfer Details (indicate cue type and reason): assist to weight shift; patient slightly dizzy therefore patient did not want to do more than transfer to chair Ambulation/Gait Ambulation/Gait: No Stairs: No Wheelchair Mobility Wheelchair Mobility: No  Posture/Postural Control Posture/Postural Control: No significant  limitations Balance Balance Assessed: Yes Static Standing Balance Static Standing - Balance Support: Bilateral upper extremity supported;During functional activity Static Standing - Level of Assistance: 5: Stand by assistance Static Standing - Comment/# of Minutes: 2    End of Session PT - End of Session Equipment Utilized During Treatment: Gait belt Activity Tolerance: Patient tolerated treatment well Patient left: in chair;with call bell in reach Nurse Communication: Mobility status for transfers General Behavior During Session: [facility] for tasks performed Cognition: [facility] for tasks performed  INGOLD,Mattthew Ziomek 07/31/2011, 12:08 PM  [facility] Acute Rehabilitation [phone] (pager)

## 2011-07-31 NOTE — Progress Notes (Signed)
ANTICOAGULATION CONSULT NOTE - Follow Up Consult  Pharmacy Consult for Heparin --> Lovenox for VTE prophylaxis Indication: atrial fibrillation  Allergies  Allergen Reactions  . Codeine   . Penicillins   . Ranitidine Hcl   . Sulfonamide Derivatives     REACTION: nausea    Patient Measurements: Height: 5\' 11"  (180.3 cm) Weight: 168 lb 14 oz (76.6 kg) IBW/kg (Calculated) : 70.8   Vital Signs: Temp: 97.9 F (36.6 C) (04/05 0801) Temp src: Oral (04/05 0801) BP: 149/74 mmHg (04/05 0801) Pulse Rate: 75  (04/05 0801)  Labs:  Basename 07/31/11 0555 07/30/11 1506 07/30/11 0613 07/29/11 0530  HGB 12.6 -- 12.2 --  HCT 36.0 -- 35.8* 34.3*  PLT 384 -- 353 297  APTT -- -- -- --  LABPROT -- -- -- --  INR -- -- -- --  HEPARINUNFRC 0.69 0.52 0.84* --  CREATININE 0.80 -- 0.83 0.67  CKTOTAL -- -- -- --  CKMB -- -- -- --  TROPONINI -- -- -- --   Estimated Creatinine Clearance: 63.7 ml/min (by C-G formula based on Cr of 0.8).   Assessment: 78 yo F admitted with Afib started on Heparin infusion.  Patient has transitioned to NSR on Diltiazem.  No plans for long-term anticoagulation 2/2 hx GI bleeds.  To d/c Heparin and start Lovenox for VTE prophylaxis.  Goal of Therapy:  Anti Xa level 0.3-0.6 4 hours after Lovenox dose administered   Plan:  D/C heparin and associated labwork. Start Lovenox 40mg  SQ daily for VTE prophylaxis. Monitor CBC every 3 days. Monitor for signs of bleeding.  [employer], Pharm.D., BCPS Clinical Pharmacist Pager [phone] 07/31/2011 9:25 AM

## 2011-08-01 DIAGNOSIS — I498 Other specified cardiac arrhythmias: Secondary | ICD-10-CM

## 2011-08-01 LAB — BASIC METABOLIC PANEL
CO2: 34 mEq/L — ABNORMAL HIGH (ref 19–32)
Calcium: 10.1 mg/dL (ref 8.4–10.5)
Chloride: 90 mEq/L — ABNORMAL LOW (ref 96–112)
Creatinine, Ser: 0.95 mg/dL (ref 0.50–1.10)
GFR calc Af Amer: 64 mL/min — ABNORMAL LOW (ref >90)
GFR calc Af Amer: 64 mL/min — ABNORMAL LOW (ref >90)
GFR calc non Af Amer: 55 mL/min — ABNORMAL LOW (ref >90)
Glucose, Bld: 137 mg/dL — ABNORMAL HIGH (ref 70–99)
Potassium: 3.6 mEq/L (ref 3.5–5.1)
Sodium: 131 mEq/L — ABNORMAL LOW (ref 135–145)
Sodium: 132 mEq/L — ABNORMAL LOW (ref 135–145)

## 2011-08-01 LAB — GLUCOSE, CAPILLARY
Glucose-Capillary: 104 mg/dL — ABNORMAL HIGH (ref 70–99)
Glucose-Capillary: 134 mg/dL — ABNORMAL HIGH (ref 70–99)

## 2011-08-01 MED ORDER — FUROSEMIDE 40 MG PO TABS
40.0000 mg | ORAL_TABLET | Freq: Two times a day (BID) | ORAL | Status: DC
  Administered 2011-08-01 – 2011-08-04 (×6): 40 mg via ORAL
  Filled 2011-08-01 (×8): qty 1

## 2011-08-01 MED ORDER — BISOPROLOL FUMARATE 10 MG PO TABS
10.0000 mg | ORAL_TABLET | Freq: Every day | ORAL | Status: DC
  Administered 2011-08-01 – 2011-08-04 (×4): 10 mg via ORAL
  Filled 2011-08-01 (×4): qty 1

## 2011-08-01 NOTE — Progress Notes (Addendum)
Clinical Social Work Department CLINICAL SOCIAL WORK PLACEMENT NOTE 08/01/2011  Patient:  [name], [name]  Account Number:  [id number] Admit date:  07/26/2011  Clinical Social Worker:  Macario Golds, Theresia Majors  Date/time:  08/01/2011 02:00 PM  Clinical Social Work is seeking post-discharge placement for this patient at the following level of care:   SKILLED NURSING   (*CSW will update this form in Epic as items are completed)   08/01/2011  Patient/family provided with [facility] Department of Clinical Social Work's list of facilities offering this level of care within the geographic area requested by the patient (or if unable, by the patient's family).  08/01/2011  Patient/family informed of their freedom to choose among providers that offer the needed level of care, that participate in Medicare, Medicaid or managed care program needed by the patient, have an available bed and are willing to accept the patient.  08/01/2011  Patient/family informed of [facility]' ownership interest in [facility], as well as of the fact that they are under no obligation to receive care at this facility.  PASARR submitted to EDS on 08/01/2011 PASARR number received from EDS on   FL2 transmitted to all facilities in geographic area requested by pt/family on  08/01/2011 FL2 transmitted to all facilities within larger geographic area on   Patient informed that his/her managed care company has contracts with or will negotiate with  certain facilities, including the following:     Patient/family informed of bed offers received:   Patient chooses bed at  Physician recommends and patient chooses bed at    Patient to be transferred to  on   Patient to be transferred to facility by   The following physician request were entered in Epic:   Additional Comments: Patient preference is for [facility] - CSW left message for admissions.  08/03/11 Discharge disposition has been updated. Plans  are for discharge home with home health services. CSW signed off. Rozetta Nunnery MSW, LCSWA [phone]).  [address] [place], [place] [phone]

## 2011-08-01 NOTE — Progress Notes (Signed)
Patient ID: [name], female   DOB: [birth year], 78 y.o.   MRN: [id number]    SUBJECTIVE: Stable today, in NSR.  Feels like she is breathing better.      Marland Kitchen aspirin EC  81 mg Oral Daily  . atorvastatin  10 mg Oral q1800  . bisoprolol  10 mg Oral Daily  . budesonide-formoterol  2 puff Inhalation BID  . diltiazem  180 mg Oral Daily  . docusate sodium  100 mg Oral BID  . enoxaparin (LOVENOX) injection  40 mg Subcutaneous QHS  . furosemide  40 mg Oral BID  . guaiFENesin  600 mg Oral BID  . levalbuterol  2 puff Inhalation BID  . lisinopril  20 mg Oral Daily  . pantoprazole  40 mg Oral Q1200  . potassium chloride SA  20 mEq Oral BID  . senna-docusate  1 tablet Oral Daily  . tiotropium  18 mcg Inhalation Daily  . DISCONTD: furosemide  40 mg Intravenous BID  . DISCONTD: metoprolol  100 mg Oral BID      Filed Vitals:   08/01/11 0735 08/01/11 0800 08/01/11 0834 08/01/11 1120  BP: 144/64   108/50  Pulse: 65   75  Temp:  97.4 F (36.3 C)    TempSrc:  Oral    Resp: 22   20  Height:      Weight:      SpO2: 99%  95% 97%    Intake/Output Summary (Last 24 hours) at 08/01/11 1204 Last data filed at 08/01/11 0500  Gross per 24 hour  Intake    483 ml  Output   1300 ml  Net   -817 ml    LABS: Basic Metabolic Panel:  Basename 08/01/11 0500 07/31/11 0555  NA 131* 129*  K 3.6 4.4  CL 90* 90*  CO2 34* 31  GLUCOSE 121* 131*  BUN 28* 24*  CREATININE 0.95 0.80  CALCIUM 10.0 10.1  MG -- --  PHOS -- --   Liver Function Tests: No results found for this basename: AST:2,ALT:2,ALKPHOS:2,BILITOT:2,PROT:2,ALBUMIN:2 in the last 72 hours No results found for this basename: LIPASE:2,AMYLASE:2 in the last 72 hours CBC:  Basename 07/31/11 0555 07/30/11 0613  WBC 16.5* 16.4*  NEUTROABS -- --  HGB 12.6 12.2  HCT 36.0 35.8*  MCV 88.2 86.5  PLT 384 353   Cardiac Enzymes: No results found for this basename: CKTOTAL:3,CKMB:3,CKMBINDEX:3,TROPONINI:3 in the last 72 hours BNP: No  components found with this basename: POCBNP:3 D-Dimer: No results found for this basename: DDIMER:2 in the last 72 hours Hemoglobin A1C: No results found for this basename: HGBA1C in the last 72 hours Fasting Lipid Panel: No results found for this basename: CHOL,HDL,LDLCALC,TRIG,CHOLHDL,LDLDIRECT in the last 72 hours Thyroid Function Tests: No results found for this basename: TSH,T4TOTAL,FREET3,T3FREE,THYROIDAB in the last 72 hours Anemia Panel: No results found for this basename: VITAMINB12,FOLATE,FERRITIN,TIBC,IRON,RETICCTPCT in the last 72 hours  RADIOLOGY: Dg Chest 2 View  07/29/2011  *RADIOLOGY REPORT*  Clinical Data: 78 year old female with pulmonary edema, bronchitis, shortness of breath.  CHEST - 2 VIEW  Comparison: 07/26/2011 and earlier.  Findings: Chronically large lung volumes. Stable cardiomegaly and mediastinal contours.  Stable biapical scarring.  Increased AP dimension to the chest and exaggerated thoracic kyphosis.  Sequelae of CABG.  No pneumothorax, pulmonary edema or consolidation. Possible small effusions.  Grossly stable osseous structures.  IMPRESSION: Chronic lung disease and cardiomegaly.  Possible small effusions. No acute pneumonia identified.  Original Report Authenticated By: Harley Hallmark, M.D.  Dg Chest Port 1 View  07/26/2011  *RADIOLOGY REPORT*  Clinical Data: Chest pain and shortness of breath.  PORTABLE CHEST - 1 VIEW  Comparison: 09/01/2010.  Findings: The cardiac silhouette, mediastinal and hilar contours are within normal limits and stable.  Stable surgical changes from bypass surgery.  Stable emphysematous changes and pulmonary scarring.  No definite acute overlying pulmonary process.  IMPRESSION: Chronic emphysematous changes and pulmonary scarring.  No definite acute overlying pulmonary process.  Original Report Authenticated By: P. Loralie Champagne, M.D.    PHYSICAL EXAM General: NAD Neck: No JVD, no thyromegaly or thyroid nodule.  Lungs: Distant breath  sounds.  CV: Nondisplaced PMI.  Heart regular S1/S2, no S3/S4, no murmur.  No peripheral edema.   Abdomen: Soft, nontender, no hepatosplenomegaly, no distention.  Neurologic: Alert and oriented x 3.  Psych: Normal affect. Extremities: No clubbing or cyanosis.   TELEMETRY: Reviewed telemetry pt in NSR  ASSESSMENT AND PLAN:  78 yo admitted with COPD exacerbation, diastolic CHF exacerbation, and episode of atrial fibrillation with RVR.  She is stable and improving.  1. COPD: Completed azithromycin course.  No wheezing.  Continue inhalers.  Will stop metoprolol and use bisoprolol instead as this is more beta-1 selective.  2. CHF: She appears euvolemic now on my evaluation.  Change Lasix to po.  3. Atrial fibrillation: Paroxysmal. Now in NSR.  She is on aspirin.  Will leave question of anticoagulation to Dr. Myrtis Ser who knows her well.  4. PT, suspect she will need rehab at discharge. 5. Transfer to telemetry.   Marca Ancona 08/01/2011 12:07 PM

## 2011-08-01 NOTE — Progress Notes (Signed)
HPI:  The patient is a 78 yo female with O2 dependant COPD admitted on 07/26/11 for SOB, she was also found to have atrial fib that was new for her. Since that time she's converted to sinus rhythm. However her pulmonary status remains unstable. She got up to go to the bathroom on 4/1 and on her way back to bed she experienced severe shortness of breath. Her O2 sat was >91% at that time with HR in 70's. Patient reports feeling sick for several days with productive cough and chills at home.   Lines / Drains:  None   Cultures:  None   Antibiotics:  Azithromycin 3/31 >>   Subjective: Patient laying comfortably in bed, in no acute distress, reports persistent SOB, but this has improved since yesterday. Saturating well on 2l [place].   Physical Exam: Filed Vitals:   08/01/11 1120  BP: 108/50  Pulse: 75  Temp: 97.6 F (36.4 C)  Resp: 20    Intake/Output Summary (Last 24 hours) at 08/01/11 1429 Last data filed at 08/01/11 0500  Gross per 24 hour  Intake    483 ml  Output   1300 ml  Net   -817 ml     General: alert, well-developed, and cooperative to examination.  Lungs: CTA B Heart: normal rate, regular rhythm, no murmur, no gallop, and no rub.  Abdomen: soft, non-tender, normal bowel sounds, no distention, no guarding, no rebound tenderness, no hepatomegaly, and no splenomegaly.  Pulses: 2+ DP/PT pulses bilaterally Extremities: No cyanosis, clubbing, edema  Neurologic: alert & oriented X3,    Labs: CBC:    Component Value Date/Time   WBC 16.5* 07/31/2011 0555   HGB 12.6 07/31/2011 0555   HCT 36.0 07/31/2011 0555   PLT 384 07/31/2011 0555   MCV 88.2 07/31/2011 0555   NEUTROABS 11.3* 07/26/2011 1946   LYMPHSABS 1.0 07/26/2011 1946   MONOABS 1.4* 07/26/2011 1946   EOSABS 0.1 07/26/2011 1946   BASOSABS 0.0 07/26/2011 1946     Basic Metabolic Panel:    Component Value Date/Time   NA 132* 08/01/2011 1200   K 3.4* 08/01/2011 1200   CL 89* 08/01/2011 1200   CO2 35* 08/01/2011 1200   BUN 26*  08/01/2011 1200   CREATININE 0.96 08/01/2011 1200   GLUCOSE 137* 08/01/2011 1200   CALCIUM 10.1 08/01/2011 1200   Comprehensive Metabolic Panel:    Component Value Date/Time   NA 132* 08/01/2011 1200   K 3.4* 08/01/2011 1200   CL 89* 08/01/2011 1200   CO2 35* 08/01/2011 1200   BUN 26* 08/01/2011 1200   CREATININE 0.96 08/01/2011 1200   GLUCOSE 137* 08/01/2011 1200   CALCIUM 10.1 08/01/2011 1200   AST 18 08/20/2007 1405   ALT 16 08/20/2007 1405   ALKPHOS 44 08/20/2007 1405   BILITOT 0.5 08/20/2007 1405   PROT 6.0 08/20/2007 1405   ALBUMIN 3.6 08/20/2007 1405   ABG    Component Value Date/Time   TCO2 29 09/25/2010 1431    Lab 07/27/11 0313  MG 1.6   Lab Results  Component Value Date   CALCIUM 10.1 08/01/2011   Assessment & Plan:  COPD In the setting of productive cough and symptomatic worsening of her SOB for the past week likely related to URI.  - Continue O2 via .  - Azithromycin course complete.  - Continue inhalers and nbs  - No further indication for steroids. - Lasix 40mg  PO BID  - Continue Mucinex BID  - Tobacco cessation counseling.  -  Add IS and flutter valve. - Patient is back on her home O2 level.  There is little else to be offered to her short of maintaining dry.  This is not amiodarone lung.  Hyponatremia Calculated serum osmolality 267, differential for hypo-osmolar hyponatremia include volume depletion, hormonal changes, such adrenal insufficiency, hypothyroidism (however TSH was WNL on 07/27/11), and ectopic production of atrial natriuretic peptide. Given the patients physical exam findings, history of COPD, and chronicity of her hyponatremia she likely has SIADH. Fluid restrict and administer lasix 40mg  PO BID.   Hypokalemia More stable, recheck.  Hypertension Persistently elevated, increase lisinopril to 20mg  daily.   PCCM signing off, please call back if needed.  Alyson Reedy, M.D. [facility] Pulmonary/Critical Care Medicine. Pager: [phone]. After hours pager:  [phone].

## 2011-08-02 ENCOUNTER — Other Ambulatory Visit: Payer: Self-pay

## 2011-08-02 DIAGNOSIS — I359 Nonrheumatic aortic valve disorder, unspecified: Secondary | ICD-10-CM

## 2011-08-02 DIAGNOSIS — I2510 Atherosclerotic heart disease of native coronary artery without angina pectoris: Secondary | ICD-10-CM

## 2011-08-02 LAB — GLUCOSE, CAPILLARY
Glucose-Capillary: 96 mg/dL (ref 70–99)
Glucose-Capillary: 133 mg/dL — ABNORMAL HIGH (ref 70–99)
Glucose-Capillary: 123 mg/dL — ABNORMAL HIGH (ref 70–99)

## 2011-08-02 LAB — CBC
MCH: 30.1 pg (ref 26.0–34.0)
MCV: 91.1 fL (ref 78.0–100.0)
Platelets: 363 10*3/uL (ref 150–400)
RDW: 12.8 % (ref 11.5–15.5)
WBC: 16.5 10*3/uL — ABNORMAL HIGH (ref 4.0–10.5)

## 2011-08-02 NOTE — Progress Notes (Signed)
   Subjective: Intermittent cough, no chest pain or palpitations.   Objective: Temp:  [97.1 F (36.2 C)-98.4 F (36.9 C)] 98.4 F (36.9 C) (04/07 0300) Pulse Rate:  [54-78] 59  (04/07 0300) Resp:  [18-20] 18  (04/07 0300) BP: (108-162)/(50-73) 162/70 mmHg (04/07 0300) SpO2:  [90 %-97 %] 92 % (04/07 0749) Weight:  [172 lb 1.6 oz (78.064 kg)] 172 lb 1.6 oz (78.064 kg) (04/07 0300)  I/O last 3 completed shifts: In: 483 [P.O.:480; I.V.:3] Out: 2600 [Urine:2600]  Telemetry - Sinus rhythm, no atrial fibrillation.  Exam -   General - NAD.  Lungs - Decreased throughout and coarse, nonlabored.  Cardiac - RRR.  Abdomen - NABS.  Extremities - No pitting.  Testing -   Lab Results  Component Value Date   WBC 16.5* 08/02/2011   HGB 12.5 08/02/2011   HCT 37.8 08/02/2011   MCV 91.1 08/02/2011   PLT 363 08/02/2011    Lab Results  Component Value Date   CREATININE 0.96 08/01/2011   BUN 26* 08/01/2011   NA 132* 08/01/2011   K 3.4* 08/01/2011   CL 89* 08/01/2011   CO2 35* 08/01/2011   ECG - Sinus rhythm with PACs and inferior Q waves.   Current Medications    . aspirin EC  81 mg Oral Daily  . atorvastatin  10 mg Oral q1800  . bisoprolol  10 mg Oral Daily  . budesonide-formoterol  2 puff Inhalation BID  . diltiazem  180 mg Oral Daily  . docusate sodium  100 mg Oral BID  . enoxaparin (LOVENOX) injection  40 mg Subcutaneous QHS  . furosemide  40 mg Oral BID  . guaiFENesin  600 mg Oral BID  . levalbuterol  2 puff Inhalation BID  . lisinopril  20 mg Oral Daily  . pantoprazole  40 mg Oral Q1200  . potassium chloride SA  20 mEq Oral BID  . senna-docusate  1 tablet Oral Daily  . tiotropium  18 mcg Inhalation Daily  . DISCONTD: furosemide  40 mg Intravenous BID  . DISCONTD: metoprolol  100 mg Oral BID    Assessment:  1. Paroxysmal atrial fibrillation. Maintaining normal sinus rhythm. On aspirin, Cardizem CD, bisoprolol. Question of more long-term anticoagulation has been deferred for  now.  2  COPD exacerbation, followed by Pulmonary.  3. Acute on chronic diastolic heart failure, volume status improved. Out more than in over the last 3 days.  4. Hypertension, lisinopril recently increased.  5. Hypokalemia, being repleted.  Plan:  Continue present regimen. D/C Foley catheter. Needs PT consult to better assess functional status and need for rehabilitation. Question of whether she would be a good candidate for long-term anticoagulation being deferred for now. Dr. Myrtis Ser has followed the patient over time.   Jonelle Sidle, M.D., F.A.C.C.

## 2011-08-02 NOTE — Progress Notes (Signed)
ANTICOAGULATION CONSULT NOTE - Follow Up Consult  Pharmacy Consult for Lovenox for VTE prophylaxis Indication: atrial fibrillation  Allergies  Allergen Reactions  . Codeine   . Penicillins   . Ranitidine Hcl   . Sulfonamide Derivatives     REACTION: nausea    Patient Measurements: Height: 5\' 11"  (180.3 cm) Weight: 172 lb 1.6 oz (78.064 kg) IBW/kg (Calculated) : 70.8   Vital Signs: Temp: 98.4 F (36.9 C) (04/07 0300) Temp src: Oral (04/07 0300) BP: 121/55 mmHg (04/07 1012) Pulse Rate: 65  (04/07 1012)  Labs:  Basename 08/02/11 0610 08/01/11 1200 08/01/11 0500 07/31/11 0555 07/30/11 1506  HGB 12.5 -- -- 12.6 --  HCT 37.8 -- -- 36.0 --  PLT 363 -- -- 384 --  APTT -- -- -- -- --  LABPROT -- -- -- -- --  INR -- -- -- -- --  HEPARINUNFRC -- -- -- 0.69 0.52  CREATININE -- 0.96 0.95 0.80 --  CKTOTAL -- -- -- -- --  CKMB -- -- -- -- --  TROPONINI -- -- -- -- --   Estimated Creatinine Clearance: 53.1 ml/min (by C-G formula based on Cr of 0.96).   Assessment: 78 yo F admitted with Afib started on Heparin infusion.  Patient has transitioned to NSR on Diltiazem.  No plans for long-term anticoagulation 2/2 hx GI bleeds.  Currently on Lovenox for VTE prophylaxis. CBC and renal function are stable.   Goal of Therapy:  Anti Xa level 0.3-0.6 4 hours after Lovenox dose administered   Plan:  Lovenox 40mg  SQ daily for VTE prophylaxis. Pharmacy to sign off.  Junita Push, PharmD, BCPS 08/02/2011 10:51 AM

## 2011-08-03 ENCOUNTER — Other Ambulatory Visit: Payer: Self-pay

## 2011-08-03 LAB — BASIC METABOLIC PANEL
Chloride: 89 mEq/L — ABNORMAL LOW (ref 96–112)
Creatinine, Ser: 0.96 mg/dL (ref 0.50–1.10)
GFR calc Af Amer: 64 mL/min — ABNORMAL LOW (ref >90)
GFR calc non Af Amer: 55 mL/min — ABNORMAL LOW (ref >90)
Potassium: 4.3 mEq/L (ref 3.5–5.1)

## 2011-08-03 LAB — GLUCOSE, CAPILLARY
Glucose-Capillary: 112 mg/dL — ABNORMAL HIGH (ref 70–99)
Glucose-Capillary: 145 mg/dL — ABNORMAL HIGH (ref 70–99)

## 2011-08-03 NOTE — Progress Notes (Signed)
   CARE MANAGEMENT NOTE 08/03/2011  Patient:  [name], [name]   Account Number:  [id number]  Date Initiated:  08/03/2011  Documentation initiated by:  Farheen Pfahler  Subjective/Objective Assessment:   PT ADM WITH COPD EXACERBATION, PAROXYSYMAL AFIB; PLANNING DC HOME TOMORROW. PT LIVES ALONE, BUT HAS SUPPORTIVE FAMILY NEARBY TO ASSIST.     Action/Plan:   MET WITH PT TO [facility].  REFERRAL TO [facility] FOR HHPT AND OT; ROLLING WALKER ORDERED FOR HOME.   Anticipated DC Date:  08/04/2011   Anticipated DC Plan:  HOME W HOME HEALTH SERVICES      DC Planning Services  CM consult      [facility] Choice  HOME HEALTH   Choice offered to / List presented to:  C-1 Patient   DME arranged  Levan Hurst      DME agency  [facility]     HH arranged  HH-2 PT  HH-3 OT      [facility] agency  [facility]   Status of service:  Completed, signed off Medicare Important Message given?   (If response is "NO", the following Medicare IM given date fields will be blank) Date Medicare IM given:   Date Additional Medicare IM given:    Discharge Disposition:  HOME W HOME HEALTH SERVICES  Per UR Regulation:    If discussed at Long Length of Stay Meetings, dates discussed:    Comments:  08/03/11 Symphanie Cederberg,RN,BSN 1428 START OF CARE 24-48H POST DC DATE.   Phone #[phone]

## 2011-08-03 NOTE — Progress Notes (Signed)
Clinical Social Worker will sign off as plans for discharge is for patient to go home with home health services. CSW notified CM.   Rozetta Nunnery MSW, [employer] [phone]

## 2011-08-03 NOTE — Progress Notes (Signed)
Pt ambulated with rolling walker and O2 on 2L. Pt walked approx. 60 feet. Pt felt weak. Will encourage more walks.

## 2011-08-03 NOTE — Progress Notes (Signed)
Physical Therapy Treatment Patient Details Name: [name] MRN: [id number] DOB: 1930-04-07 Today's Date: 08/03/2011  PT Assessment/Plan  PT - Assessment/Plan Comments on Treatment Session: Patient s/p respiratory issues with decr mobility secondary to DOE with activity on admit.  Patient much less dyspneic today.  Should be fine to go home with HHPT, HHOT f/u and RW for home use.  Also needs intermittent supervision.   PT Plan: Discharge plan needs to be updated;Frequency remains appropriate PT Frequency: Min 3X/week Recommendations for Other Services: OT consult Follow Up Recommendations: Home health PT;Supervision - Intermittent Equipment Recommended: Rolling walker with 5" wheels PT Goals  Acute Rehab PT Goals PT Goal: Sit to Stand - Progress: Met PT Goal: Stand to Sit - Progress: Met PT Transfer Goal: Bed to Chair/Chair to Bed - Progress: Progressing toward goal PT Goal: Ambulate - Progress: Progressing toward goal  PT Treatment Precautions/Restrictions  Precautions Precautions: Fall Required Braces or Orthoses: No Restrictions Weight Bearing Restrictions: No Mobility (including Balance) Bed Mobility Rolling Right: Not tested (comment) Right Sidelying to Sit: Not tested (comment) Sit to Supine: Not Tested (comment) Transfers Sit to Stand: 5: Supervision;With upper extremity assist;From bed Sit to Stand Details (indicate cue type and reason): cues for hand placement Stand to Sit: 5: Supervision;With upper extremity assist;To bed Stand to Sit Details: cues for hand placement Stand Pivot Transfers: Not tested (comment) Ambulation/Gait Ambulation/Gait Assistance: 4: Min assist Ambulation/Gait Assistance Details (indicate cue type and reason): Patient needed occasional cues to steer RW and stay close to RW.   Ambulation Distance (Feet): 145 Feet Assistive device: Rolling walker Gait Pattern: Step-through pattern Gait velocity: slow cadence Stairs: No Wheelchair  Mobility Wheelchair Mobility: No  Posture/Postural Control Posture/Postural Control: No significant limitations Static Standing Balance Static Standing - Balance Support: Bilateral upper extremity supported;During functional activity Static Standing - Level of Assistance: 5: Stand by assistance Static Standing - Comment/# of Minutes: 2    End of Session PT - End of Session Equipment Utilized During Treatment: Gait belt Activity Tolerance: Patient tolerated treatment well Patient left: in chair;with call bell in reach Nurse Communication: Mobility status for transfers;Mobility status for ambulation General Behavior During Session: [facility] for tasks performed Cognition: [facility] for tasks performed  INGOLD,Jaliah Foody 08/03/2011, 3:58 PM [facility] Acute Rehabilitation [phone] (pager)

## 2011-08-03 NOTE — Progress Notes (Signed)
Occupational Therapy Treatment Patient Details Name: [name] MRN: [id number] DOB: 1929-11-21 Today's Date: 08/03/2011  OT Assessment/Plan OT Assessment/Plan OT Frequency: Min 2X/week Follow Up Recommendations: Home health OT Equipment Recommended: Defer to next venue OT Goals ADL Goals ADL Goal: Grooming - Progress: Progressing toward goals ADL Goal: Toilet Transfer - Progress: Progressing toward goals ADL Goal: Toileting - Clothing Manipulation - Progress: Progressing toward goals ADL Goal: Toileting - Hygiene - Progress: Progressing toward goals  OT Treatment Precautions/Restrictions  Restrictions Weight Bearing Restrictions: No   ADL ADL Eating/Feeding: Performed;Independent Where Assessed - Eating/Feeding: Edge of bed Grooming: Simulated;Wash/dry hands Where Assessed - Grooming: Standing at sink Toilet Transfer: Automotive engineer Method: Ambulating;Other (comment) (bed to chair) Toileting - Clothing Manipulation: Minimal assistance;Performed Where Assessed - Toileting Clothing Manipulation: Standing Toileting - Hygiene: Simulated Where Assessed - Toileting Hygiene: Standing ADL Comments: Discussed energy conservation in regards to ADL activity. Pt states family in and out at all times and will help with ADL anc IADL activity Mobility  Transfers Transfers: Yes Sit to Stand: 4: Min assist;With upper extremity assist Stand to Sit: 5: Supervision;With upper extremity assist     End of Session OT - End of Session Activity Tolerance: Patient tolerated treatment well Patient left: in bed General Behavior During Session: [facility] for tasks performed Cognition: [facility] for tasks performed  [facility], [place]  08/03/2011, 12:58 PM

## 2011-08-03 NOTE — Progress Notes (Signed)
Patient ID: [name], female   DOB: 27-Sep-1929, 78 y.o.   MRN: [id number]    SUBJECTIVE:  The patient is greatly improved since admission. I had a careful discussion with her about watching her salt and fluid intake. We have treated her for a combination of CHF and bronchitis. She does not need any further steroids that have been tapered. She needs continuation of her diuretics.  Filed Vitals:   08/02/11 1955 08/03/11 0333 08/03/11 0816 08/03/11 0916  BP: 126/61 124/68  123/68  Pulse: 54 57  61  Temp: 98.8 F (37.1 C) 98.2 F (36.8 C)  97.6 F (36.4 C)  TempSrc: Oral Oral  Oral  Resp: 19 18  18   Height:      Weight:      SpO2: 97% 94% 94% 94%    Intake/Output Summary (Last 24 hours) at 08/03/11 1100 Last data filed at 08/03/11 0900  Gross per 24 hour  Intake    480 ml  Output      0 ml  Net    480 ml    LABS: Basic Metabolic Panel:  Basename 08/03/11 0440 08/01/11 1200  NA 132* 132*  K 4.3 3.4*  CL 89* 89*  CO2 38* 35*  GLUCOSE 107* 137*  BUN 24* 26*  CREATININE 0.96 0.96  CALCIUM 9.7 10.1  MG -- --  PHOS -- --   Liver Function Tests: No results found for this basename: AST:2,ALT:2,ALKPHOS:2,BILITOT:2,PROT:2,ALBUMIN:2 in the last 72 hours No results found for this basename: LIPASE:2,AMYLASE:2 in the last 72 hours CBC:  Basename 08/02/11 0610  WBC 16.5*  NEUTROABS --  HGB 12.5  HCT 37.8  MCV 91.1  PLT 363   Cardiac Enzymes: No results found for this basename: CKTOTAL:3,CKMB:3,CKMBINDEX:3,TROPONINI:3 in the last 72 hours BNP: No components found with this basename: POCBNP:3 D-Dimer: No results found for this basename: DDIMER:2 in the last 72 hours Hemoglobin A1C: No results found for this basename: HGBA1C in the last 72 hours Fasting Lipid Panel: No results found for this basename: CHOL,HDL,LDLCALC,TRIG,CHOLHDL,LDLDIRECT in the last 72 hours Thyroid Function Tests: No results found for this basename: TSH,T4TOTAL,FREET3,T3FREE,THYROIDAB in the  last 72 hours  RADIOLOGY: Dg Chest 2 View  07/29/2011  *RADIOLOGY REPORT*  Clinical Data: 78 year old female with pulmonary edema, bronchitis, shortness of breath.  CHEST - 2 VIEW  Comparison: 07/26/2011 and earlier.  Findings: Chronically large lung volumes. Stable cardiomegaly and mediastinal contours.  Stable biapical scarring.  Increased AP dimension to the chest and exaggerated thoracic kyphosis.  Sequelae of CABG.  No pneumothorax, pulmonary edema or consolidation. Possible small effusions.  Grossly stable osseous structures.  IMPRESSION: Chronic lung disease and cardiomegaly.  Possible small effusions. No acute pneumonia identified.  Original Report Authenticated By: Harley Hallmark, M.D.   Dg Chest Port 1 View  07/26/2011  *RADIOLOGY REPORT*  Clinical Data: Chest pain and shortness of breath.  PORTABLE CHEST - 1 VIEW  Comparison: 09/01/2010.  Findings: The cardiac silhouette, mediastinal and hilar contours are within normal limits and stable.  Stable surgical changes from bypass surgery.  Stable emphysematous changes and pulmonary scarring.  No definite acute overlying pulmonary process.  IMPRESSION: Chronic emphysematous changes and pulmonary scarring.  No definite acute overlying pulmonary process.  Original Report Authenticated By: P. Loralie Champagne, M.D.    PHYSICAL EXAM   The patient is wearing her oxygen. She wears at home. Overall her spirits are much better and she is stable. Lung exam reveals decreased breath sounds. There are no  rales. Cardiac exam reveals S1 and S2. There is no significant murmurs. The abdomen is soft. Her edema is gone.  TELEMETRY:  I have reviewed telemetry. She has sinus rhythm.   ASSESSMENT AND PLAN:   OXYGEN-USE OF SUPPLEMENTAL  The patient will continue to use home oxygen when she goes home.   CAD (coronary artery disease)   Despite his significant illness this admission with the stresses of atrial fib and hypoxemia and CHF, or coronary disease has been  stable.   Aortic valve sclerosis  Hypertension  Hyperlipidemia   GI bleed   There is a significant history of GI bleeding with AVMs. On this basis we will not be using Coumadin.   Hyponatremia   With diuresis she has had significant improvement in her sodium.   COPD (chronic obstructive pulmonary disease)   With this admission the patient had significant cough with sputum production. This is greatly improved after her tapering steroids and erythromycin.   Tobacco abuse    The patient of course has not smoke while being in the hospital. I am hopeful that she will stop smoking but she has never done this in the past despite all our efforts   Bradycardia   Patient has not had significant bradycardia during this admission.   Atrial fibrillation   The patient presented with atrial fibrillation and had another bout of atrial fib in the hospital. She is on Cardizem. I had raised the question as to whether or not we could use amiodarone if needed. I'm not sure that this question was ever completely addressed.   Hypoxemia   Pulmonary edema   The patient did present with pulmonary edema. She is diaries then done well. She needs careful attention to salt and fluid intake. Also her diuretic dose will be higher when she goes home.   Hypokalemia   Potassium has been treated in the hospital in a stable.   H/O steroid therapy   Steroids were used short course this hospitalization. She does not need any further steroids.   Chronic respiratory failure  The patient has severe underlying lung disease. I strongly urged her not to smoke at home.  The patient is getting stronger. There has been discussion of possible nursing home but she hopes to go home. We will arrange for home health nursing. If she ambulates well today she can go home tomorrow. She will need to be seen in the office within 2 weeks of discharge. She can be seen by Tereso Newcomer or Kathi Ludwig if I do not have spots on my  schedule.  Willa Rough 08/03/2011 11:00 AM

## 2011-08-04 ENCOUNTER — Encounter (HOSPITAL_COMMUNITY): Payer: Self-pay | Admitting: Physician Assistant

## 2011-08-04 LAB — BASIC METABOLIC PANEL
CO2: 38 mEq/L — ABNORMAL HIGH (ref 19–32)
Chloride: 87 mEq/L — ABNORMAL LOW (ref 96–112)
Creatinine, Ser: 1.14 mg/dL — ABNORMAL HIGH (ref 0.50–1.10)
GFR calc Af Amer: 52 mL/min — ABNORMAL LOW (ref >90)
Potassium: 4.2 mEq/L (ref 3.5–5.1)

## 2011-08-04 LAB — GLUCOSE, CAPILLARY: Glucose-Capillary: 104 mg/dL — ABNORMAL HIGH (ref 70–99)

## 2011-08-04 MED ORDER — BISOPROLOL FUMARATE 10 MG PO TABS: 10.0000 mg | ORAL_TABLET | Freq: Every day | ORAL | Status: DC

## 2011-08-04 MED ORDER — ATORVASTATIN CALCIUM 10 MG PO TABS: 10.0000 mg | ORAL_TABLET | Freq: Every day | ORAL | Status: DC

## 2011-08-04 MED ORDER — LISINOPRIL 20 MG PO TABS: 20.0000 mg | ORAL_TABLET | Freq: Every day | ORAL | Status: DC

## 2011-08-04 MED ORDER — LEVALBUTEROL TARTRATE 45 MCG/ACT IN AERO: 2.0000 | INHALATION_SPRAY | Freq: Two times a day (BID) | RESPIRATORY_TRACT | Status: DC

## 2011-08-04 MED ORDER — POTASSIUM CHLORIDE CRYS ER 20 MEQ PO TBCR: 20.0000 meq | EXTENDED_RELEASE_TABLET | Freq: Every day | ORAL | Status: DC

## 2011-08-04 MED ORDER — PANTOPRAZOLE SODIUM 40 MG PO TBEC: 40.0000 mg | DELAYED_RELEASE_TABLET | Freq: Every day | ORAL | Status: DC

## 2011-08-04 MED ORDER — NITROGLYCERIN 0.4 MG SL SUBL: 0.4000 mg | SUBLINGUAL_TABLET | SUBLINGUAL | Status: DC | PRN

## 2011-08-04 MED ORDER — FUROSEMIDE 40 MG PO TABS: 40.0000 mg | ORAL_TABLET | Freq: Every day | ORAL | Status: DC

## 2011-08-04 MED ORDER — DILTIAZEM HCL ER COATED BEADS 180 MG PO CP24: 180.0000 mg | ORAL_CAPSULE | Freq: Every day | ORAL | Status: DC

## 2011-08-04 NOTE — Discharge Summary (Signed)
Discharge Summary   Patient ID: [name] MRN: [id number], DOB/AGE: 1929/12/05 78 y.o. Admit date: 07/26/2011 D/C date:     08/04/2011   Primary Discharge Diagnoses:  1. Newly recognized paroxysmal atrial fibrillation with RVR - not felt to be a coumadin candidate secondary to hx of GIB/AVMs 2. COPD exacerbation 3. Chronic respiratory failure on home O2 secondary to COPD 4. Chronic hyponatremia felt secondary to SIADH 5. Hypertension 6. Mild acute renal insufficiency (Cr 1.14 at discharge, was 0.48 on admission)  Secondary Discharge Diagnoses:  1.CAD  - s/p CABG x5 (2005)  - started on statin this admission, will need f/u LFTS/lipids 6 weeks - TTE 07/2011: EF 55-60% 2. Hyperlipidemia  3. Hx GI bleed due to AVMs  4. Bradycardia, asymptomatic 5. PVD s/p carotid endarterectomy  6. Chronic lower extremity venous stasis  7. S/p cholecystectomy  8. S/p abdominal hysterectomy  Hospital Course: 78 year old woman with a history of CAD s/p CABG 2005, extensive tobacco use resulting in oxygen-dependent COPD, hyperlipidemia, hypertension, and a remote history of GI bleeding from presumed AVMs who presents to the [facility] Emergency Department with 3-4 days of progressive shortness of breath, productive cough, and palpitations. She developed a cough which became more and more persistent with production of greenish sputum. No CP, tightness, PND, orthopnea, LEE, syncope. In the ER she was found to be in atrial fib with RVR. She was started on IV diltiazem and IV heparin. It was also felt she had a COPD exacerbation and was started on antibiotics, nebs, short-term steroids. Overnight she converted to NSR with HR in the 60s. She was seen by Pulmonary who agreed with current plan & also recommended IV Lasix, in light of possible SIADH given hx of COPD, physical exam findings and chronicity of hyponatremia. They also recommended to keep O2 between 88-92%. 2D echo was obtained demonstrating normal LV  function and otherwise no significant abnormalities. On 4/3 she developed dyspnea following a breathing treatment with noted recurrence of AF RVR with rates in the 130-160 range and IV diltiazem was re-initiated. CXR demonstrated possible small effusions, no acute pneumonia. The IV diltiazem was able to be changed back to oral diltiazem. PCCM saw the patient and added IS and flutter valve. On 4/6 her metoprolol was changed to bisoprolol for beta-1 selectivity. Ultimately the decision was made not to place her on long-term Coumadin given hx of AVMs & GIB. The patient was felt to be debilitated this admission, was seen by PT with recommendation for SNF vs HH and ultimately the patient decided to go home with [facility] services. Today she feels better. The patient was seen and examined today and felt stable for discharge by Dr. Eden Emms. Of note her Cr is mildly elevated at discharge, has been trending upwards with use of Lasix so we will obtain an outpatient BMET to ensure stability. We will decrease her lasix down to 40mg  once daily and follow kidney function as an outpatient.  Discharge Vitals: Blood pressure 109/56, pulse 50, temperature 97.5 F (36.4 C), temperature source Oral, resp. rate 20, height 5\' 11"  (1.803 m), weight 172 lb 1.6 oz (78.064 kg), SpO2 96.00%.  Labs: Lab Results  Component Value Date   WBC 16.5* 08/02/2011   HGB 12.5 08/02/2011   HCT 37.8 08/02/2011   MCV 91.1 08/02/2011   PLT 363 08/02/2011     Lab 08/04/11 0540  NA 129*  K 4.2  CL 87*  CO2 38*  BUN 29*  CREATININE 1.14*  CALCIUM 10.1  PROT --  BILITOT --  ALKPHOS --  ALT --  AST --  GLUCOSE 142*   Diagnostic Studies/Procedures   1. Chest 2 View 07/29/2011  *RADIOLOGY REPORT*  Clinical Data: 78 year old female with pulmonary edema, bronchitis, shortness of breath.  CHEST - 2 VIEW  Comparison: 07/26/2011 and earlier.  Findings: Chronically large lung volumes. Stable cardiomegaly and mediastinal contours.  Stable biapical scarring.   Increased AP dimension to the chest and exaggerated thoracic kyphosis.  Sequelae of CABG.  No pneumothorax, pulmonary edema or consolidation. Possible small effusions.  Grossly stable osseous structures.  IMPRESSION: Chronic lung disease and cardiomegaly.  Possible small effusions. No acute pneumonia identified.  Original Report Authenticated By: Harley Hallmark, M.D.   2. 2D Echo 07/28/11 Study Conclusions Left ventricle: The cavity size was normal. Wall thickness was normal. Systolic function was normal. The estimated ejection fraction was in the range of 55% to 60%. Wall motion was normal; there were no regional wall motion abnormalities. Left ventricular diastolic function parameters were normal.   Discharge Medications   Medication List  As of 08/04/2011 12:49 PM   STOP taking these medications         albuterol 108 (90 BASE) MCG/ACT inhaler      ibuprofen 200 MG tablet      metoprolol 100 MG tablet      verapamil 180 MG CR tablet         TAKE these medications         aspirin 81 MG tablet   Take 81 mg by mouth daily.      atorvastatin 10 MG tablet   Commonly known as: LIPITOR   Take 1 tablet (10 mg total) by mouth at bedtime.      bisoprolol 10 MG tablet   Commonly known as: ZEBETA   Take 1 tablet (10 mg total) by mouth daily.      budesonide-formoterol 160-4.5 MCG/ACT inhaler   Commonly known as: SYMBICORT   Inhale 2 puffs into the lungs 2 (two) times daily.      diltiazem 180 MG 24 hr capsule   Commonly known as: CARDIZEM CD   Take 1 capsule (180 mg total) by mouth daily.      docusate sodium 100 MG capsule   Commonly known as: COLACE   Take 100 mg by mouth 2 (two) times daily.      ergocalciferol 50000 UNITS capsule   Commonly known as: VITAMIN D2   1 capsule by mouth every other week      furosemide 40 MG tablet   Commonly known as: LASIX   Take 1 tablet (40 mg total) by mouth daily.      IPRATROPIUM BROMIDE NA   2 sprays by Nasal route as needed.       levalbuterol 45 MCG/ACT inhaler   Commonly known as: XOPENEX HFA   Inhale 2 puffs into the lungs 2 (two) times daily. May also use every 6 hours as needed for shortness of breath/wheezing (maximum: 12 puffs per day).      lisinopril 20 MG tablet   Commonly known as: PRINIVIL,ZESTRIL   Take 1 tablet (20 mg total) by mouth daily.      nicotine 10 MG inhaler   Commonly known as: NICOTROL   Inhale 1 puff into the lungs as needed.      nitroGLYCERIN 0.4 MG SL tablet   Commonly known as: NITROSTAT   Place 1 tablet (0.4 mg total) under the tongue  every 5 (five) minutes x 3 doses as needed for chest pain.      pantoprazole 40 MG tablet   Commonly known as: PROTONIX   Take 1 tablet (40 mg total) by mouth daily.      potassium chloride SA 20 MEQ tablet   Commonly known as: K-DUR,KLOR-CON   Take 1 tablet (20 mEq total) by mouth daily.      selenium sulfide 2.5 % shampoo   Commonly known as: SELSUN   As directed      sennosides-docusate sodium 8.6-50 MG tablet   Commonly known as: SENOKOT-S   Take 1 tablet by mouth daily.      tiotropium 18 MCG inhalation capsule   Commonly known as: SPIRIVA   Place 18 mcg into inhaler and inhale daily.            Disposition   The patient will be discharged in stable condition to home. Discharge Orders    Future Appointments: Provider: Department: Dept Phone: Center:   08/26/2011 9:15 AM Luis Abed, MD [facility] [phone] [facility]     Future Orders Please Complete By Expires   Diet - low sodium heart healthy      Increase activity slowly        Follow-up Information    Follow up with Willa Rough, MD. (08/26/11 at 9:15am)    Contact information:   [address] [place] [address] [phone]       Follow up with Home Health. (Please have home health draw labs (BMET) on either 4/15 or 08/11/11, results should be sent to Dr. Henrietta Hoover office)       Follow up with Primary Care  Doctor/Lung Doctor. (Please make sure you have follow-up scheduled)            Duration of Discharge Encounter: 1 hour including physician and PA time.  Signed, Ronie Spies PA-C 08/04/2011, 12:49 PM

## 2011-08-04 NOTE — Progress Notes (Addendum)
Pt. Discharged 08/04/2011  1:26 PM Discharge instructions reviewed with patient/family. Patient/family verbalized understanding. All Rx's given. Questions answered as needed. Pt. Discharged to home with family/self. Discharge instructions on diet given to pt Markita Stcharles, Belenda Cruise JessupRN

## 2011-08-04 NOTE — Progress Notes (Signed)
Patient ID: [name], female   DOB: 1929/08/19, 78 y.o.   MRN: [id number]    SUBJECTIVE:  Less SOB  Wants to go home  Filed Vitals:   08/03/11 1424 08/03/11 1814 08/03/11 2138 08/04/11 0454  BP: 108/53  106/57 132/55  Pulse: 69  60 50  Temp: 97.9 F (36.6 C)  97.5 F (36.4 C) 97.5 F (36.4 C)  TempSrc:   Oral Oral  Resp: 20  20 20   Height:      Weight:      SpO2: 94% 94% 93% 95%    Intake/Output Summary (Last 24 hours) at 08/04/11 0740 Last data filed at 08/03/11 1300  Gross per 24 hour  Intake    480 ml  Output    500 ml  Net    -20 ml    LABS: Basic Metabolic Panel:  Basename 08/04/11 0540 08/03/11 0440  NA 129* 132*  K 4.2 4.3  CL 87* 89*  CO2 38* 38*  GLUCOSE 142* 107*  BUN 29* 24*  CREATININE 1.14* 0.96  CALCIUM 10.1 9.7  MG -- --  PHOS -- --   Liver Function Tests: No results found for this basename: AST:2,ALT:2,ALKPHOS:2,BILITOT:2,PROT:2,ALBUMIN:2 in the last 72 hours No results found for this basename: LIPASE:2,AMYLASE:2 in the last 72 hours CBC:  Basename 08/02/11 0610  WBC 16.5*  NEUTROABS --  HGB 12.5  HCT 37.8  MCV 91.1  PLT 363    RADIOLOGY: Dg Chest 2 View  07/29/2011  *RADIOLOGY REPORT*  Clinical Data: 78 year old female with pulmonary edema, bronchitis, shortness of breath.  CHEST - 2 VIEW  Comparison: 07/26/2011 and earlier.  Findings: Chronically large lung volumes. Stable cardiomegaly and mediastinal contours.  Stable biapical scarring.  Increased AP dimension to the chest and exaggerated thoracic kyphosis.  Sequelae of CABG.  No pneumothorax, pulmonary edema or consolidation. Possible small effusions.  Grossly stable osseous structures.  IMPRESSION: Chronic lung disease and cardiomegaly.  Possible small effusions. No acute pneumonia identified.  Original Report Authenticated By: Harley Hallmark, M.D.   Dg Chest Port 1 View  07/26/2011  *RADIOLOGY REPORT*  Clinical Data: Chest pain and shortness of breath.  PORTABLE CHEST - 1 VIEW   Comparison: 09/01/2010.  Findings: The cardiac silhouette, mediastinal and hilar contours are within normal limits and stable.  Stable surgical changes from bypass surgery.  Stable emphysematous changes and pulmonary scarring.  No definite acute overlying pulmonary process.  IMPRESSION: Chronic emphysematous changes and pulmonary scarring.  No definite acute overlying pulmonary process.  Original Report Authenticated By: P. Loralie Champagne, M.D.    PHYSICAL EXAM     Affect appropriate Chroniically ill COPDer HEENT: normal Neck supple with no adenopathy JVP normal no bruits no thyromegaly Lungs Rhonchi no active wheezing  and good diaphragmatic motion Heart:  S1/S2 no murmur, no rub, gallop or click PMI normal Abdomen: benighn, BS positve, no tenderness, no AAA no bruit.  No HSM or HJR Distal pulses intact with no bruits Chrinic venous insuf. Neuro non-focal Skin warm and dry No muscular weakness  TELEMETRY:  I have reviewed telemetry. She has sinus rhythm.   ASSESSMENT AND PLAN:   OXYGEN-USE OF SUPPLEMENTAL  The patient will continue to use home oxygen when she goes home.   CAD (coronary artery disease)   Despite his significant illness this admission with the stresses of atrial fib and hypoxemia and CHF, or coronary disease has been stable.   Aortic valve sclerosis  Hypertension  Hyperlipidemia   GI bleed  There is a significant history of GI bleeding with AVMs. On this basis we will not be using Coumadin.   Hyponatremia   With diuresis she has had significant improvement in her sodium.  F/U BMET as outpatient   COPD (chronic obstructive pulmonary disease)   With this admission the patient had significant cough with sputum production. This is greatly improved after her tapering steroids and erythromycin.   Tobacco abuse    The patient of course has not smoke while being in the hospital. I am hopeful that she will stop smoking but she has never done this in the past  despite all our efforts   Bradycardia   Patient has not had significant bradycardia during this admission.   Atrial fibrillation   The patient presented with atrial fibrillation and had another bout of atrial fib in the hospital. She is on Cardizem. I had raised the question as to whether or not we could use amiodarone if needed. I'm not sure that this question was ever completely addressed.   Hypoxemia   Pulmonary edema   The patient did present with pulmonary edema. She is diaries then done well. She needs careful attention to salt and fluid intake. Also her diuretic dose will be higher when she goes home.   Hypokalemia   Potassium has been treated in the hospital in a stable.   H/O steroid therapy   Steroids were used short course this hospitalization. She does not need any further steroids.   Chronic respiratory failure  The patient has severe underlying lung disease. I strongly urged her not to smoke at home.  D/C home today.  Has home oxygen.  F/U JK 2-3 weeks.  Charlton Haws 08/04/2011 7:40 AM

## 2011-08-26 ENCOUNTER — Ambulatory Visit (INDEPENDENT_AMBULATORY_CARE_PROVIDER_SITE_OTHER): Payer: Medicare Other | Admitting: Cardiology

## 2011-08-26 ENCOUNTER — Encounter: Payer: Self-pay | Admitting: Cardiology

## 2011-08-26 VITALS — BP 186/78 | HR 70 | Ht 71.5 in | Wt 176.8 lb

## 2011-08-26 DIAGNOSIS — Z720 Tobacco use: Secondary | ICD-10-CM

## 2011-08-26 DIAGNOSIS — K922 Gastrointestinal hemorrhage, unspecified: Secondary | ICD-10-CM

## 2011-08-26 DIAGNOSIS — I4891 Unspecified atrial fibrillation: Secondary | ICD-10-CM

## 2011-08-26 DIAGNOSIS — F17200 Nicotine dependence, unspecified, uncomplicated: Secondary | ICD-10-CM

## 2011-08-26 DIAGNOSIS — I2510 Atherosclerotic heart disease of native coronary artery without angina pectoris: Secondary | ICD-10-CM

## 2011-08-26 DIAGNOSIS — R9430 Abnormal result of cardiovascular function study, unspecified: Secondary | ICD-10-CM

## 2011-08-26 DIAGNOSIS — R0989 Other specified symptoms and signs involving the circulatory and respiratory systems: Secondary | ICD-10-CM

## 2011-08-26 DIAGNOSIS — E871 Hypo-osmolality and hyponatremia: Secondary | ICD-10-CM

## 2011-08-26 DIAGNOSIS — J9610 Chronic respiratory failure, unspecified whether with hypoxia or hypercapnia: Secondary | ICD-10-CM

## 2011-08-26 DIAGNOSIS — I5030 Unspecified diastolic (congestive) heart failure: Secondary | ICD-10-CM

## 2011-08-26 MED ORDER — ATORVASTATIN CALCIUM 10 MG PO TABS: 10.0000 mg | ORAL_TABLET | Freq: Every day | ORAL | Status: DC

## 2011-08-26 MED ORDER — LISINOPRIL 20 MG PO TABS: 20.0000 mg | ORAL_TABLET | Freq: Every day | ORAL | Status: DC

## 2011-08-26 MED ORDER — FUROSEMIDE 40 MG PO TABS: 40.0000 mg | ORAL_TABLET | Freq: Every day | ORAL | Status: DC

## 2011-08-26 MED ORDER — BISOPROLOL FUMARATE 10 MG PO TABS: 10.0000 mg | ORAL_TABLET | Freq: Every day | ORAL | Status: DC

## 2011-08-26 MED ORDER — DILTIAZEM HCL ER COATED BEADS 180 MG PO CP24: 180.0000 mg | ORAL_CAPSULE | Freq: Every day | ORAL | Status: DC

## 2011-08-26 NOTE — Patient Instructions (Signed)
Your physician recommends that you schedule a follow-up appointment in: 3 months.   Your physician recommends that you return for lab work in: today (bmet)  Take Omeprazole for your reflux (heartburn)  Take acetaminophen for a headache.

## 2011-08-27 LAB — BASIC METABOLIC PANEL
Calcium: 10 mg/dL (ref 8.4–10.5)
GFR: 64.86 mL/min (ref >60.00)
Glucose, Bld: 94 mg/dL (ref 70–99)
Potassium: 4 mEq/L (ref 3.5–5.1)
Sodium: 138 mEq/L (ref 135–145)

## 2011-08-28 ENCOUNTER — Encounter: Payer: Self-pay | Admitting: Cardiology

## 2011-08-28 NOTE — Assessment & Plan Note (Signed)
Despite the significant stresses of her recent hospitalization the patient had no evidence of cardiac ischemia. No further workup.

## 2011-08-28 NOTE — Progress Notes (Signed)
HPI   Patient is seen post hospitalization. As part of today's visit I have reviewed her extensive hospital records. She had a very complicated hospitalization. She came in with exacerbation of COPD. However she also developed volume overload CHF. In addition she had bursts of atrial fibrillation. Ultimately she was diuresis and her lungs were treated. Anticoagulation could not be used for her atrial fibrillation. She converted to sinus.  Today she returns looking relatively stable. She is wearing her home O2. She does tell me that she is stop smoking. This is a huge step forward for her.  Allergies  Allergen Reactions  . Codeine   . Penicillins   . Ranitidine Hcl   . Sulfonamide Derivatives     REACTION: nausea    Current Outpatient Prescriptions  Medication Sig Dispense Refill  . aspirin 81 MG tablet Take 81 mg by mouth daily.        Marland Kitchen atorvastatin (LIPITOR) 10 MG tablet Take 1 tablet (10 mg total) by mouth at bedtime.  90 tablet  4  . bisoprolol (ZEBETA) 10 MG tablet Take 1 tablet (10 mg total) by mouth daily.  90 tablet  4  . budesonide-formoterol (SYMBICORT) 160-4.5 MCG/ACT inhaler Inhale 2 puffs into the lungs 2 (two) times daily.        Marland Kitchen diltiazem (CARDIZEM CD) 180 MG 24 hr capsule Take 1 capsule (180 mg total) by mouth daily.  90 capsule  4  . docusate sodium (COLACE) 100 MG capsule Take 100 mg by mouth 2 (two) times daily.        . ergocalciferol (VITAMIN D2) 50000 UNITS capsule 1 capsule by mouth every other week      . furosemide (LASIX) 40 MG tablet Take 1 tablet (40 mg total) by mouth daily.  90 tablet  4  . hydrOXYzine (VISTARIL) 25 MG capsule Take 1 tablet by mouth Every 6 hours as needed.      . IPRATROPIUM BROMIDE NA 2 sprays by Nasal route as needed.        . levalbuterol (XOPENEX HFA) 45 MCG/ACT inhaler Inhale 2 puffs into the lungs 2 (two) times daily. May also use every 6 hours as needed for shortness of breath/wheezing (maximum: 12 puffs per day).  1 Inhaler  2    . lisinopril (PRINIVIL,ZESTRIL) 20 MG tablet Take 1 tablet (20 mg total) by mouth daily.  90 tablet  4  . nicotine (NICOTROL) 10 MG inhaler Inhale 1 puff into the lungs as needed.        . nitroGLYCERIN (NITROSTAT) 0.4 MG SL tablet Place 1 tablet (0.4 mg total) under the tongue every 5 (five) minutes x 3 doses as needed for chest pain.  25 tablet  4  . pantoprazole (PROTONIX) 40 MG tablet Take 1 tablet (40 mg total) by mouth daily.  30 tablet  2  . permethrin (ELIMITE) 5 % cream Apply 1 application topically.      . potassium chloride SA (K-DUR,KLOR-CON) 20 MEQ tablet Take 1 tablet (20 mEq total) by mouth daily.      Marland Kitchen selenium sulfide (SELSUN) 2.5 % shampoo As directed      . sennosides-docusate sodium (SENOKOT-S) 8.6-50 MG tablet Take 1 tablet by mouth daily.      Marland Kitchen tiotropium (SPIRIVA) 18 MCG inhalation capsule Place 18 mcg into inhaler and inhale daily.          History   Social History  . Marital Status: Widowed    Spouse Name: N/A  Number of Children: 1  . Years of Education: N/A   Occupational History  . retired     [employer]   Social History Main Topics  . Smoking status: Former Smoker -- 0.8 packs/day for 59 years    Types: Cigarettes    Quit date: 07/26/2011  . Smokeless tobacco: Not on file  . Alcohol Use: No  . Drug Use: Not on file  . Sexually Active: Not on file   Other Topics Concern  . Not on file   Social History Narrative  . No narrative on file    No family history on file.  Past Medical History  Diagnosis Date  . CAD (coronary artery disease)     s/p CABG x5 (2005)   . Hypertension   . Hyperlipidemia   . GI bleed     AVMs  . Aneurysm, aortic     thoracic aorta, stable at 4.1 cm, chest CT, May, 2012  . Syncope     Nitroglycerin plus a diuretic, April, 2009  . Hyponatremia     Chronic. Felt secondary to SIADH   . COPD (chronic obstructive pulmonary disease)   . Tobacco abuse   . Bradycardia   . Carotid artery disease     Hx of  endarterectomy. Doppler October, 2011, stable, 0-39% RIC A., 40-59% LICA  . Tuberculosis     Exposures to tuberculosis 1970s, has tested negative by the health Department  . Atrial fibrillation     Paroxysmal with RVR 07/2011, not felt to be a coumadin candidate secondary to hx of GIB/AVM  . Venous stasis of lower extremity     Chronic    Past Surgical History  Procedure Date  . Cholecystectomy   . Carotid endartercetomy   . Abdominal hysterectomy   . Coronary artery bypass graft 2005    ROS   Patient denies fever, chills, headache, sweats, rash, change in vision, change in hearing, chest pain, nausea vomiting, urinary symptoms. All other systems are reviewed and are negative.  PHYSICAL EXAM The patient looks good for her overall status. She is using her home O2. She is oriented to person time and place. Affect is normal. Lungs reveal decreased breath sounds. There is no jugular venous distention. Cardiac exam reveals S1 and S2. The rhythm is regular. Abdomen is soft. She is no peripheral edema today. There no musculoskeletal deformities. There are no skin rashes.  Filed Vitals:   08/26/11 1608  BP: 186/78  Pulse: 70  Height: 5' 11.5" (1.816 m)  Weight: 176 lb 12.8 oz (80.196 kg)     ASSESSMENT & PLAN.

## 2011-08-28 NOTE — Assessment & Plan Note (Signed)
The patient is now back to baseline. She had been admitted with a significant exacerbation of her lung disease. This was treated along with diastolic heart failure and she improved. She is at baseline now.

## 2011-08-28 NOTE — Assessment & Plan Note (Signed)
Her ejection fraction was checked while she was in the hospital. He was 60%.

## 2011-08-28 NOTE — Assessment & Plan Note (Signed)
I am extremely pleased here that the patient should she stop smoking. She has been unable to try to do this in the past.

## 2011-08-28 NOTE — Assessment & Plan Note (Signed)
The patient did have hyponatremia in the hospital. He responded well to diuresis. This is also a chronic problem.

## 2011-08-28 NOTE — Assessment & Plan Note (Signed)
When she was very ill in the hospital she had some bursts of atrial fibrillation. She cannot be anticoagulated. Fortunately she reverted to sinus rhythm.

## 2011-08-28 NOTE — Assessment & Plan Note (Signed)
The patient has had GI bleeding from AVMs in the past. We cannot anticoagulate her for the limited atrial fibrillation that she had in the hospital.

## 2011-08-28 NOTE — Assessment & Plan Note (Signed)
During her hospitalization she had significant volume overload related to diastolic CHF. She was diaries been stabilize. She is on appropriate meds at this time.

## 2011-08-31 ENCOUNTER — Telehealth: Payer: Self-pay | Admitting: Pulmonary Disease

## 2011-08-31 MED ORDER — LEVALBUTEROL TARTRATE 45 MCG/ACT IN AERO: 2.0000 | INHALATION_SPRAY | RESPIRATORY_TRACT | Status: DC | PRN

## 2011-08-31 NOTE — Telephone Encounter (Signed)
I spoke with pt and needing a refill on her xopnex inhaler sent to [facility]. I advised will send rx and nothing further was needed

## 2011-09-08 ENCOUNTER — Other Ambulatory Visit: Payer: Self-pay | Admitting: Cardiology

## 2011-09-08 MED ORDER — PANTOPRAZOLE SODIUM 40 MG PO TBEC: 40.0000 mg | DELAYED_RELEASE_TABLET | Freq: Every day | ORAL | Status: DC

## 2011-09-08 NOTE — Telephone Encounter (Signed)
Refill- Protonix  40 mg Tablet   Verified preferred as [facility], patient can be reached at [phone] for additional information

## 2011-10-26 ENCOUNTER — Encounter: Payer: Self-pay | Admitting: Pulmonary Disease

## 2011-10-26 ENCOUNTER — Ambulatory Visit (INDEPENDENT_AMBULATORY_CARE_PROVIDER_SITE_OTHER): Payer: Medicare Other | Admitting: Pulmonary Disease

## 2011-10-26 VITALS — BP 122/76 | HR 57 | Temp 97.9°F | Ht 71.5 in | Wt 177.4 lb

## 2011-10-26 DIAGNOSIS — J449 Chronic obstructive pulmonary disease, unspecified: Secondary | ICD-10-CM

## 2011-10-26 DIAGNOSIS — J4489 Other specified chronic obstructive pulmonary disease: Secondary | ICD-10-CM

## 2011-10-26 MED ORDER — LEVALBUTEROL TARTRATE 45 MCG/ACT IN AERO: 2.0000 | INHALATION_SPRAY | RESPIRATORY_TRACT | Status: DC | PRN

## 2011-10-26 NOTE — Patient Instructions (Signed)
Pulmonary rehab program  Call us if you catch a chest cold Rx refill for xopenex sent

## 2011-10-26 NOTE — Assessment & Plan Note (Signed)
Pulmonary rehab program  Call us if you catch a chest cold Rx refill for xopenex sent Smoking cessation -counselled Ct symbicort, spiriva

## 2011-10-26 NOTE — Progress Notes (Signed)
  Subjective:    Patient ID: [name], female    DOB: 26-Jan-1930, 78 y.o.   MRN: [id number]  HPI [name] , smoker presents for FU of COPD.  She has been on home O2 since dec 2010 ([employer]) after a chest cold requiring prednisone, CXR then showed hyperinflation. She smokes 1/2 PPD, about 59 Pyrs - chantix made her sick & she is afraid of cardiac side effects with patches. She sees Dr Myrtis Ser for CAD & has tolerated lisinopril & metoprolol, echo 7/10 showed nml LV fn & non dilated RV. An episode of syncope in 2009 was attributed to NTG & diuretics.  She reports clear phlegm, nasal drip & sneezing - no seasonal allergies, nasal atrovent dries her out.  She has lt >> Rt pedal edema  Desaturated to 86% onRA with minimal ambulation.  reviewed PFTs 08/19/09 >> severe airway obstruction, FEV1 47%, no BD response, air trapping +, severe decrease in diffusion  CT angio 5/12 asc aortic aneurysm 41mm, hepatic cysts    10/26/2011 25m FU Hosp 3/31- 08/04/11 for Newly recognized paroxysmal atrial fibrillation with RVR and COPD exacerbation - not felt to be a coumadin candidate secondary to hx of GIB/AVMs  Smoked a few since dc Ventolin Changed to xopenex MDI - not sure this works as well, uses 4-5 /d CXR 07/29/11 no infx  compliant with O2 , helpful    Review of Systems Patient denies significant dyspnea,cough, hemoptysis,  chest pain, palpitations, pedal edema, orthopnea, paroxysmal nocturnal dyspnea, lightheadedness, nausea, vomiting, abdominal or  leg pains      Objective:   Physical Exam  Gen. Pleasant, thin, on O2, in no distress ENT - no lesions, no post nasal drip Neck: No JVD, no thyromegaly, no carotid bruits Lungs: no use of accessory muscles, no dullness to percussion, clear without rales or rhonchi  Cardiovascular: Rhythm regular, heart sounds  normal, no murmurs or gallops, no peripheral edema Musculoskeletal: No deformities, no cyanosis or clubbing        Assessment & Plan:

## 2011-12-02 ENCOUNTER — Encounter: Payer: Self-pay | Admitting: Cardiology

## 2011-12-02 DIAGNOSIS — I4891 Unspecified atrial fibrillation: Secondary | ICD-10-CM | POA: Insufficient documentation

## 2011-12-03 ENCOUNTER — Ambulatory Visit (INDEPENDENT_AMBULATORY_CARE_PROVIDER_SITE_OTHER): Payer: Medicare Other | Admitting: Cardiology

## 2011-12-03 ENCOUNTER — Encounter: Payer: Self-pay | Admitting: Cardiology

## 2011-12-03 VITALS — BP 150/72 | HR 62 | Ht 71.0 in | Wt 177.1 lb

## 2011-12-03 DIAGNOSIS — I2510 Atherosclerotic heart disease of native coronary artery without angina pectoris: Secondary | ICD-10-CM

## 2011-12-03 DIAGNOSIS — Z720 Tobacco use: Secondary | ICD-10-CM

## 2011-12-03 DIAGNOSIS — F17200 Nicotine dependence, unspecified, uncomplicated: Secondary | ICD-10-CM

## 2011-12-03 DIAGNOSIS — I5030 Unspecified diastolic (congestive) heart failure: Secondary | ICD-10-CM

## 2011-12-03 DIAGNOSIS — I509 Heart failure, unspecified: Secondary | ICD-10-CM

## 2011-12-03 NOTE — Progress Notes (Signed)
HPI Patient is seen today for followup. I saw her last May, 2013. She had an episode of severe respiratory failure. There was some volume overload. She had some atrial fibrillation. She cannot be anticoagulated. She did convert to sinus rhythm.  I saw her last she did stop smoking. She admits today that she became anxious and started to smoke again. She smoking approximately 5 per day. She does seem to be committed to try to stop again. I tried to be encouraging. She's not having any chest pain.  Allergies  Allergen Reactions  . Codeine   . Penicillins   . Ranitidine Hcl   . Sulfonamide Derivatives     REACTION: nausea    Current Outpatient Prescriptions  Medication Sig Dispense Refill  . aspirin 81 MG tablet Take 81 mg by mouth daily.        Marland Kitchen atorvastatin (LIPITOR) 10 MG tablet Take 1 tablet (10 mg total) by mouth at bedtime.  90 tablet  4  . bisoprolol (ZEBETA) 10 MG tablet Take 1 tablet (10 mg total) by mouth daily.  90 tablet  4  . budesonide-formoterol (SYMBICORT) 160-4.5 MCG/ACT inhaler Inhale 2 puffs into the lungs daily.       Marland Kitchen diltiazem (CARDIZEM CD) 180 MG 24 hr capsule Take 1 capsule (180 mg total) by mouth daily.  90 capsule  4  . docusate sodium (COLACE) 100 MG capsule Take 100 mg by mouth 2 (two) times daily.        . ergocalciferol (VITAMIN D2) 50000 UNITS capsule 1 capsule by mouth every other week      . furosemide (LASIX) 40 MG tablet Take 1 tablet (40 mg total) by mouth daily.  90 tablet  4  . hydrOXYzine (VISTARIL) 25 MG capsule Take 1 tablet by mouth Every 6 hours as needed.      . IPRATROPIUM BROMIDE NA 2 sprays by Nasal route as needed.        . levalbuterol (XOPENEX HFA) 45 MCG/ACT inhaler Inhale 2 puffs into the lungs every 4 (four) hours as needed for wheezing.  3 Inhaler  3  . lisinopril (PRINIVIL,ZESTRIL) 20 MG tablet Take 1 tablet (20 mg total) by mouth daily.  90 tablet  4  . nicotine (NICOTROL) 10 MG inhaler Inhale 1 puff into the lungs as needed.         . nitroGLYCERIN (NITROSTAT) 0.4 MG SL tablet Place 1 tablet (0.4 mg total) under the tongue every 5 (five) minutes x 3 doses as needed for chest pain.  25 tablet  4  . pantoprazole (PROTONIX) 40 MG tablet Take 1 tablet (40 mg total) by mouth daily.  90 tablet  3  . permethrin (ELIMITE) 5 % cream Apply 1 application topically.      . potassium chloride SA (K-DUR,KLOR-CON) 20 MEQ tablet Take 1 tablet (20 mEq total) by mouth daily.      Marland Kitchen selenium sulfide (SELSUN) 2.5 % shampoo As directed      . sennosides-docusate sodium (SENOKOT-S) 8.6-50 MG tablet Take 1 tablet by mouth daily.      Marland Kitchen tiotropium (SPIRIVA) 18 MCG inhalation capsule Place 18 mcg into inhaler and inhale daily.          History   Social History  . Marital Status: Widowed    Spouse Name: N/A    Number of Children: 1  . Years of Education: N/A   Occupational History  . retired     [employer]   Social History  Main Topics  . Smoking status: Current Everyday Smoker -- 0.5 packs/day for 59 years    Types: Cigarettes  . Smokeless tobacco: Not on file   Comment: has had 2-3 cigs since  . Alcohol Use: No  . Drug Use: Not on file  . Sexually Active: Not on file   Other Topics Concern  . Not on file   Social History Narrative  . No narrative on file    No family history on file.  Past Medical History  Diagnosis Date  . CAD (coronary artery disease)     s/p CABG x5 (2005)   . Hypertension   . Hyperlipidemia   . GI bleed     AVMs  . Aneurysm, aortic     thoracic aorta, stable at 4.1 cm, chest CT, May, 2012  . Syncope     Nitroglycerin plus a diuretic, April, 2009  . Hyponatremia     Chronic. Felt secondary to SIADH   . COPD (chronic obstructive pulmonary disease)   . Tobacco abuse   . Bradycardia   . Carotid artery disease     Hx of endarterectomy. Doppler October, 2011, stable, 0-39% RIC A., 40-59% LICA  . Tuberculosis     Exposures to tuberculosis 1970s, has tested negative by the health Department  . Atrial  fibrillation     Paroxysmal with RVR 07/2011, not felt to be a coumadin candidate secondary to hx of GIB/AVM  . Venous stasis of lower extremity     Chronic  . Ejection fraction     EF 55-60%, echo, April, 2013  . CHF with left ventricular diastolic dysfunction, NYHA class 2     Diastolic dysfunction    Past Surgical History  Procedure Date  . Cholecystectomy   . Carotid endartercetomy   . Abdominal hysterectomy   . Coronary artery bypass graft 2005    ROS  Patient denies fever, chills, headache, sweats, rash, change in vision, change in hearing, chest pain, cough, nausea vomiting, urinary symptoms. All other systems are reviewed and are negative.  PHYSICAL EXAM  Patient is wearing her home oxygen. She has kyphosis of the spine. She stable. Her weight is increased only 1 pound since her last visit. Lungs reveal decreased breath sounds. There is scattered rhonchi. There is no jugulovenous distention. Cardiac exam reveals S1 and S2. There no clicks or significant murmurs. Abdomen is soft. There is no peripheral edema.  Filed Vitals:   12/03/11 1113  BP: 150/72  Pulse: 62  Height: 5\' 11"  (1.803 m)  Weight: 177 lb 1.9 oz (80.341 kg)  SpO2: 93%     ASSESSMENT & PLAN

## 2011-12-03 NOTE — Patient Instructions (Addendum)
Your physician wants you to follow-up in: 3 months. You will receive a reminder letter in the mail two months in advance. If you don't receive a letter, please call our office to schedule the follow-up appointment.   

## 2011-12-03 NOTE — Assessment & Plan Note (Signed)
Coronary disease is stable.  No further workup. 

## 2011-12-03 NOTE — Assessment & Plan Note (Signed)
Unfortunately she started to smoke approximately 5 cigarettes per day. She says she is going to try to stop again.

## 2011-12-03 NOTE — Assessment & Plan Note (Signed)
Her volume status is remaining stable. No change in therapy.

## 2011-12-11 ENCOUNTER — Emergency Department (HOSPITAL_COMMUNITY): Payer: Medicare Other

## 2011-12-11 ENCOUNTER — Encounter (HOSPITAL_COMMUNITY): Payer: Self-pay | Admitting: Emergency Medicine

## 2011-12-11 ENCOUNTER — Emergency Department (HOSPITAL_COMMUNITY)
Admission: EM | Admit: 2011-12-11 | Discharge: 2011-12-11 | Disposition: A | Discharge status: Home / Self Care | Payer: Medicare Other | Attending: Emergency Medicine | Admitting: Emergency Medicine

## 2011-12-11 DIAGNOSIS — E785 Hyperlipidemia, unspecified: Secondary | ICD-10-CM | POA: Insufficient documentation

## 2011-12-11 DIAGNOSIS — J449 Chronic obstructive pulmonary disease, unspecified: Secondary | ICD-10-CM | POA: Insufficient documentation

## 2011-12-11 DIAGNOSIS — I4891 Unspecified atrial fibrillation: Secondary | ICD-10-CM | POA: Insufficient documentation

## 2011-12-11 DIAGNOSIS — R0602 Shortness of breath: Secondary | ICD-10-CM | POA: Insufficient documentation

## 2011-12-11 DIAGNOSIS — Z951 Presence of aortocoronary bypass graft: Secondary | ICD-10-CM | POA: Insufficient documentation

## 2011-12-11 DIAGNOSIS — F17200 Nicotine dependence, unspecified, uncomplicated: Secondary | ICD-10-CM | POA: Insufficient documentation

## 2011-12-11 DIAGNOSIS — I10 Essential (primary) hypertension: Secondary | ICD-10-CM | POA: Insufficient documentation

## 2011-12-11 DIAGNOSIS — R079 Chest pain, unspecified: Secondary | ICD-10-CM | POA: Insufficient documentation

## 2011-12-11 DIAGNOSIS — J4489 Other specified chronic obstructive pulmonary disease: Secondary | ICD-10-CM | POA: Insufficient documentation

## 2011-12-11 DIAGNOSIS — I2510 Atherosclerotic heart disease of native coronary artery without angina pectoris: Secondary | ICD-10-CM | POA: Insufficient documentation

## 2011-12-11 DIAGNOSIS — I5030 Unspecified diastolic (congestive) heart failure: Secondary | ICD-10-CM | POA: Insufficient documentation

## 2011-12-11 DIAGNOSIS — I509 Heart failure, unspecified: Secondary | ICD-10-CM | POA: Insufficient documentation

## 2011-12-11 DIAGNOSIS — Z79899 Other long term (current) drug therapy: Secondary | ICD-10-CM | POA: Insufficient documentation

## 2011-12-11 LAB — COMPREHENSIVE METABOLIC PANEL
ALT: 9 U/L (ref 0–35)
AST: 13 U/L (ref 0–37)
Alkaline Phosphatase: 70 U/L (ref 39–117)
CO2: 31 mEq/L (ref 19–32)
Chloride: 99 mEq/L (ref 96–112)
GFR calc Af Amer: 66 mL/min — ABNORMAL LOW (ref >90)
GFR calc non Af Amer: 57 mL/min — ABNORMAL LOW (ref >90)
Glucose, Bld: 137 mg/dL — ABNORMAL HIGH (ref 70–99)
Potassium: 3.9 mEq/L (ref 3.5–5.1)
Sodium: 138 mEq/L (ref 135–145)

## 2011-12-11 LAB — CBC
Hemoglobin: 11.8 g/dL — ABNORMAL LOW (ref 12.0–15.0)
RBC: 4.12 MIL/uL (ref 3.87–5.11)
WBC: 8 10*3/uL (ref 4.0–10.5)

## 2011-12-11 MED ORDER — BISOPROLOL FUMARATE 10 MG PO TABS
10.0000 mg | ORAL_TABLET | Freq: Once | ORAL | Status: AC
  Administered 2011-12-11: 10 mg via ORAL
  Filled 2011-12-11 (×2): qty 1

## 2011-12-11 MED ORDER — DILTIAZEM HCL 100 MG IV SOLR
5.0000 mg/h | Freq: Once | INTRAVENOUS | Status: AC
  Administered 2011-12-11: 5 mg/h via INTRAVENOUS
  Filled 2011-12-11: qty 100

## 2011-12-11 MED ORDER — ASPIRIN 81 MG PO CHEW
324.0000 mg | CHEWABLE_TABLET | Freq: Once | ORAL | Status: DC
  Filled 2011-12-11: qty 4

## 2011-12-11 MED ORDER — ALBUTEROL SULFATE (5 MG/ML) 0.5% IN NEBU
5.0000 mg | INHALATION_SOLUTION | Freq: Once | RESPIRATORY_TRACT | Status: AC
  Administered 2011-12-11: 5 mg via RESPIRATORY_TRACT
  Filled 2011-12-11: qty 1

## 2011-12-11 MED ORDER — DILTIAZEM HCL ER COATED BEADS 240 MG PO CP24
240.0000 mg | ORAL_CAPSULE | Freq: Every day | ORAL | Status: DC
  Administered 2011-12-11: 240 mg via ORAL
  Filled 2011-12-11 (×2): qty 1

## 2011-12-11 MED ORDER — DILTIAZEM HCL 25 MG/5ML IV SOLN
20.0000 mg | Freq: Once | INTRAVENOUS | Status: AC
  Administered 2011-12-11: 5 mg via INTRAVENOUS
  Filled 2011-12-11: qty 5

## 2011-12-11 MED ORDER — IPRATROPIUM BROMIDE 0.02 % IN SOLN
0.5000 mg | Freq: Once | RESPIRATORY_TRACT | Status: AC
  Administered 2011-12-11: 0.5 mg via RESPIRATORY_TRACT
  Filled 2011-12-11: qty 2.5

## 2011-12-11 MED ORDER — ONDANSETRON HCL 4 MG/2ML IJ SOLN
4.0000 mg | Freq: Once | INTRAMUSCULAR | Status: AC
  Administered 2011-12-11: 4 mg via INTRAVENOUS
  Filled 2011-12-11: qty 2

## 2011-12-11 MED ORDER — DILTIAZEM HCL ER COATED BEADS 240 MG PO CP24: 240.0000 mg | ORAL_CAPSULE | Freq: Every day | ORAL | Status: DC

## 2011-12-11 MED ORDER — SODIUM CHLORIDE 0.9 % IV SOLN
INTRAVENOUS | Status: DC
  Administered 2011-12-11: 05:00:00 via INTRAVENOUS

## 2011-12-11 MED ORDER — MORPHINE SULFATE 2 MG/ML IJ SOLN
2.0000 mg | Freq: Once | INTRAMUSCULAR | Status: AC
Start: 2011-12-11 — End: 2011-12-11
  Administered 2011-12-11: 2 mg via INTRAVENOUS
  Filled 2011-12-11: qty 1

## 2011-12-11 MED ORDER — ONDANSETRON HCL 4 MG/2ML IJ SOLN
INTRAMUSCULAR | Status: AC
  Administered 2011-12-11: 4 mg via INTRAVENOUS
  Filled 2011-12-11: qty 2

## 2011-12-11 NOTE — Progress Notes (Signed)
Patient ID: [name], female   DOB: 08-12-1929, 78 y.o.   MRN: [id number]   CARDIOLOGY CONSULT NOTE  Patient ID: [name] MRN: [id number], DOB/AGE: [birth year]   Admit date: 12/11/2011 Date of Consult: 12/11/2011   Primary Physician: Kaleen Mask, MD Primary Cardiologist: Jerral Bonito MD  Pt. Profile  [name] is a 78 year old white female follow Dr. Myrtis Ser diffuse vascular disease, history of congestive heart failure, and history of paroxysmal A. fib. She came to emergency room this morning by EMS after being awakened with palpitations and subsequent chest heaviness. She was found to be in atrial fib rate of 110-120 with nonspecific ST segment changes.  Problem List  Past Medical History  Diagnosis Date  . CAD (coronary artery disease)     s/p CABG x5 (2005)   . Hypertension   . Hyperlipidemia   . GI bleed     AVMs  . Aneurysm, aortic     thoracic aorta, stable at 4.1 cm, chest CT, May, 2012  . Syncope     Nitroglycerin plus a diuretic, April, 2009  . Hyponatremia     Chronic. Felt secondary to SIADH   . COPD (chronic obstructive pulmonary disease)   . Tobacco abuse   . Bradycardia   . Carotid artery disease     Hx of endarterectomy. Doppler October, 2011, stable, 0-39% RIC A., 40-59% LICA  . Tuberculosis     Exposures to tuberculosis 1970s, has tested negative by the health Department  . Atrial fibrillation     Paroxysmal with RVR 07/2011, not felt to be a coumadin candidate secondary to hx of GIB/AVM  . Venous stasis of lower extremity     Chronic  . Ejection fraction     EF 55-60%, echo, April, 2013  . CHF with left ventricular diastolic dysfunction, NYHA class 2     Diastolic dysfunction    Past Surgical History  Procedure Date  . Cholecystectomy   . Carotid endartercetomy   . Abdominal hysterectomy   . Coronary artery bypass graft 2005     Allergies  Allergies  Allergen Reactions  . Codeine Other (See Comments)    unknown  .  Penicillins Other (See Comments)    unknown  . Ranitidine Hcl Other (See Comments)    unknown  . Sulfonamide Derivatives     REACTION: nausea    HPI   As above, she has a history of coronary artery disease and has had bypass surgery in the past. She also has carotid disease and peripheral vascular disease. She also has chronic diastolic heart failure. Recently, she had paroxysmal A. fib in the setting of respiratory failure. She continues to smoke.  She is not a Coumadin candidate because a history of a GI bleed. First troponin is negative. She is converted to normal sinus rhythm on IV diltiazem. Chest x-ray unremarkable. Other blood work unremarkable.  She states she is compliant with her meds.  Inpatient Medications     . albuterol  5 mg Nebulization Once  . bisoprolol  10 mg Oral Once  . diltiazem  240 mg Oral Daily  . diltiazem (CARDIZEM) infusion  5 mg/hr Intravenous Once  . diltiazem  20 mg Intravenous Once  . ipratropium  0.5 mg Nebulization Once  .  morphine injection  2 mg Intravenous Once  . ondansetron      . ondansetron (ZOFRAN) IV  4 mg Intravenous Once  . DISCONTD: aspirin  324 mg Oral Once    Family  History No family history on file.   Social History History   Social History  . Marital Status: Widowed    Spouse Name: N/A    Number of Children: 1  . Years of Education: N/A   Occupational History  . retired     [employer]   Social History Main Topics  . Smoking status: Current Everyday Smoker -- 0.5 packs/day for 59 years    Types: Cigarettes  . Smokeless tobacco: Not on file   Comment: has had 2-3 cigs since  . Alcohol Use: No  . Drug Use: Not on file  . Sexually Active: Not on file   Other Topics Concern  . Not on file   Social History Narrative  . No narrative on file     Review of Systems  General:  No chills, fever, night sweats or weight changes.  Cardiovascular:  Positive  dyspnea on exertion,  palpitations,  Dermatological: No rash,  lesions/masses Respiratory: No cough, dyspnea Urologic: No hematuria, dysuria Abdominal:   No nausea, vomiting, diarrhea, bright red blood per rectum, melena, or hematemesis Neurologic:  No visual changes, wkns, changes in mental status. All other systems reviewed and are otherwise negative except as noted above.  Physical Exam  Blood pressure 114/54, pulse 80, temperature 97.9 F (36.6 C), temperature source Oral, resp. rate 16, SpO2 97.00%.  General: Pleasant, NAD, chronically ill-appearing Psych: Normal affect. Neuro: Alert and oriented X 3. Moves all extremities spontaneously. HEENT: Normal  Neck: Supple without bruits or JVD. Lungs:  Resp regular and unlabored, CTA. Heart: RRR no s3, s4, or murmurs. Abdomen: Soft, non-tender, non-distended, BS + x 4.  Extremities: No clubbing, cyanosis or edema. Decreased pulses in lower extremities but feet are warm.  Labs   Basename 12/11/11 0453  CKTOTAL --  CKMB --  TROPONINI <0.30   Lab Results  Component Value Date   WBC 8.0 12/11/2011   HGB 11.8* 12/11/2011   HCT 36.3 12/11/2011   MCV 88.1 12/11/2011   PLT 224 12/11/2011    Lab 12/11/11 0453  NA 138  K 3.9  CL 99  CO2 31  BUN 16  CREATININE 0.93  CALCIUM 10.1  PROT 6.2  BILITOT 0.2*  ALKPHOS 70  ALT 9  AST 13  GLUCOSE 137*   Lab Results  Component Value Date   CHOL  Value: 153        ATP III CLASSIFICATION:  <200     mg/dL   Desirable  161-096  mg/dL   Borderline High  >=045    mg/dL   High 08/03/8117   HDL 42 08/21/2007   LDLCALC  Value: 81        Total Cholesterol/HDL:CHD Risk Coronary Heart Disease Risk Table                     Men   Women  1/2 Average Risk   3.4   3.3 08/21/2007   TRIG 148 08/21/2007   Lab Results  Component Value Date   DDIMER  Value: 0.29        AT THE INHOUSE ESTABLISHED CUTOFF VALUE OF 0.48 ug/mL FEU, THIS ASSAY HAS BEEN DOCUMENTED IN THE LITERATURE TO HAVE 01/23/2007    Radiology/Studies  Dg Chest Port 1 View  12/11/2011  *RADIOLOGY  REPORT*  Clinical Data: Chest pain.  COPD.Short of breath.  PORTABLE CHEST - 1 VIEW  Comparison: 08/25/2011.  Findings: Stable appearance of the chest.  Chronic interstitial changes, emphysema and pulmonary parenchymal  scarring appears similar.  No airspace consolidation.  Mild left basilar atelectasis. No pneumothorax. Monitoring leads are projected over the chest.  CABG.  IMPRESSION: No acute abnormality.  Stable appearance of the chest aside from mild left basilar atelectasis.  Original Report Authenticated By: Andreas Newport, M.D.    ECG Initial EKG showed A. fib with a rate of 108 beats per minute. She had ST segment changes laterally. Repeat EKG shows normal sinus rhythm with no ST segment changes other than and aVL. Compared to old EKGs no significant change  ASSESSMENT AND PLAN   Ms. Sias is a elderly white female with diffuse vascular disease including cardiac, neurovascular, and peripheral vascular disease. She has a history of chronic diastolic heart failure. She continues to smoke. She has bad COPD as well. She awoke with atrial fib symptomatically and had chest discomfort. EKG is now normal after converting to sinus rhythm. Chest x-ray unremarkable for COPD. she's not in failure. She is converted to sinus rhythm on IV Cardizem.  We'll repeat another troponin. If negative will send home. We'll give her a.m. bisoprolol and increase her dose of diltiazem extended release to  240 mg. She's been instructed to continue that dose at home. She's also been instructed to stop smoking. I made her aware as long she smoked she will continue to have recurrent risk of A. fib as well as angina. She will give her best efforts and her grandson is at the bedside says he will strongly encourage her. She can see Dr. Myrtis Ser back as scheduled. Signed, Valera Castle, MD 12/11/2011, 9:19 AM

## 2011-12-11 NOTE — ED Notes (Addendum)
Patient with chest pain that started at 0200 this am, describing as a pressure, shortness of breath, nausea, no vomiting.  Patient stated that she went to lie down, the pressure started in her chest and radiated to left elbow and neck.  When she sat up, the pain did not go away.  Patient given 324mg  of ASA, 4mg  of Zofran and 2 sl nitro by EMS.

## 2011-12-11 NOTE — ED Provider Notes (Addendum)
History     CSN: [id number]  Arrival date & time 12/11/11  0403   First MD Initiated Contact with Patient 12/11/11 [phone]      Chief Complaint  Patient presents with  . Chest Pain    (Consider location/radiation/quality/duration/timing/severity/associated sxs/prior treatment) Patient is a 78 y.o. female presenting with chest pain. The history is provided by the patient.  Chest Pain Primary symptoms include shortness of breath. Pertinent negatives for primary symptoms include no fever and no abdominal pain.   pt with hx copd. cad, cabg 2005, afib, presents w cp/tightness/heaviness onset this morning. Was sleeping, felt at baseline last pm. Constant, heavy/dull, radiates to left elbow. No specific exacerbating or alleviating factors. Mild associated sob, although pt states always sob at baseline. Nausea. No vomiting. No diaphoresis. Pain is not pleuritic. Does feel similar to pain prior to previous cardiac stent. States w her copd her breathing is always bad, uses home o2 2 liters, regular mdis/nebs, smoker - states feels as if needs breathing tx also. Denies cough, no fever or chills.  Took ntg at home with no change in symptoms. Denies any other cp or discomfort. States just saw her cardiologist, Dr Myrtis Ser, last week but was doing fine then.      Past Medical History  Diagnosis Date  . CAD (coronary artery disease)     s/p CABG x5 (2005)   . Hypertension   . Hyperlipidemia   . GI bleed     AVMs  . Aneurysm, aortic     thoracic aorta, stable at 4.1 cm, chest CT, May, 2012  . Syncope     Nitroglycerin plus a diuretic, April, 2009  . Hyponatremia     Chronic. Felt secondary to SIADH   . COPD (chronic obstructive pulmonary disease)   . Tobacco abuse   . Bradycardia   . Carotid artery disease     Hx of endarterectomy. Doppler October, 2011, stable, 0-39% RIC A., 40-59% LICA  . Tuberculosis     Exposures to tuberculosis 1970s, has tested negative by the health Department  . Atrial  fibrillation     Paroxysmal with RVR 07/2011, not felt to be a coumadin candidate secondary to hx of GIB/AVM  . Venous stasis of lower extremity     Chronic  . Ejection fraction     EF 55-60%, echo, April, 2013  . CHF with left ventricular diastolic dysfunction, NYHA class 2     Diastolic dysfunction    Past Surgical History  Procedure Date  . Cholecystectomy   . Carotid endartercetomy   . Abdominal hysterectomy   . Coronary artery bypass graft 2005    No family history on file.  History  Substance Use Topics  . Smoking status: Current Everyday Smoker -- 0.5 packs/day for 59 years    Types: Cigarettes  . Smokeless tobacco: Not on file   Comment: has had 2-3 cigs since  . Alcohol Use: No    OB History    Grav Para Term Preterm Abortions TAB SAB Ect Mult Living                  Review of Systems  Constitutional: Negative for fever and chills.  HENT: Negative for neck pain.   Eyes: Negative for redness.  Respiratory: Positive for shortness of breath.   Cardiovascular: Positive for chest pain. Negative for leg swelling.  Gastrointestinal: Negative for abdominal pain.  Genitourinary: Negative for flank pain.  Musculoskeletal: Negative for back pain.  Skin: Negative for  rash.  Neurological: Negative for headaches.  Hematological: Does not bruise/bleed easily.  Psychiatric/Behavioral: Negative for confusion.    Allergies  Codeine; Penicillins; Ranitidine hcl; and Sulfonamide derivatives  Home Medications   Current Outpatient Rx  Name Route Sig Dispense Refill  . ASPIRIN 81 MG PO TABS Oral Take 81 mg by mouth daily.      . ATORVASTATIN CALCIUM 10 MG PO TABS Oral Take 1 tablet (10 mg total) by mouth at bedtime. 90 tablet 4  . BISOPROLOL FUMARATE 10 MG PO TABS Oral Take 1 tablet (10 mg total) by mouth daily. 90 tablet 4  . BUDESONIDE-FORMOTEROL FUMARATE 160-4.5 MCG/ACT IN AERO Inhalation Inhale 2 puffs into the lungs daily.     Marland Kitchen DILTIAZEM HCL ER COATED BEADS 180 MG  PO CP24 Oral Take 1 capsule (180 mg total) by mouth daily. 90 capsule 4  . DOCUSATE SODIUM 100 MG PO CAPS Oral Take 100 mg by mouth 2 (two) times daily.      . ERGOCALCIFEROL 50000 UNITS PO CAPS  1 capsule by mouth every other week    . FUROSEMIDE 40 MG PO TABS Oral Take 1 tablet (40 mg total) by mouth daily. 90 tablet 4  . HYDROXYZINE PAMOATE 25 MG PO CAPS Oral Take 1 tablet by mouth Every 6 hours as needed.    . IPRATROPIUM BROMIDE NA Nasal 2 sprays by Nasal route as needed.      Marland Kitchen LEVALBUTEROL TARTRATE 45 MCG/ACT IN AERO Inhalation Inhale 2 puffs into the lungs every 4 (four) hours as needed for wheezing. 3 Inhaler 3    Follow up with your pulmonary doctor for this medi ...  . LISINOPRIL 20 MG PO TABS Oral Take 1 tablet (20 mg total) by mouth daily. 90 tablet 4  . NICOTINE 10 MG IN INHA Inhalation Inhale 1 puff into the lungs as needed.      Marland Kitchen NITROGLYCERIN 0.4 MG SL SUBL Sublingual Place 1 tablet (0.4 mg total) under the tongue every 5 (five) minutes x 3 doses as needed for chest pain. 25 tablet 4  . PANTOPRAZOLE SODIUM 40 MG PO TBEC Oral Take 1 tablet (40 mg total) by mouth daily. 90 tablet 3  . POTASSIUM CHLORIDE CRYS ER 20 MEQ PO TBCR Oral Take 1 tablet (20 mEq total) by mouth daily.    . SELENIUM SULFIDE 2.5 % EX LOTN  As directed    . SENNA-DOCUSATE SODIUM 8.6-50 MG PO TABS Oral Take 1 tablet by mouth daily.    Marland Kitchen TIOTROPIUM BROMIDE MONOHYDRATE 18 MCG IN CAPS Inhalation Place 18 mcg into inhaler and inhale daily.        BP 124/104  Temp 98.3 F (36.8 C) (Oral)  Resp 20  SpO2 99%  Physical Exam  Nursing note and vitals reviewed. Constitutional: She is oriented to person, place, and time. She appears well-developed and well-nourished. No distress.  HENT:  Nose: Nose normal.  Mouth/Throat: Oropharynx is clear and moist.  Eyes: Conjunctivae are normal. No scleral icterus.  Neck: Neck supple. No tracheal deviation present.  Cardiovascular: Normal rate, regular rhythm, normal heart  sounds and intact distal pulses.  Exam reveals no gallop and no friction rub.   No murmur heard. Pulmonary/Chest: No respiratory distress. She has wheezes. She exhibits no tenderness.  Abdominal: Soft. Normal appearance and bowel sounds are normal. She exhibits no distension. There is no tenderness.  Musculoskeletal: She exhibits no edema and no tenderness.  Neurological: She is alert and oriented to person, place, and  time.  Skin: Skin is warm and dry. No rash noted.  Psychiatric: She has a normal mood and affect.    ED Course  Procedures (including critical care time)   Labs Reviewed  CBC  COMPREHENSIVE METABOLIC PANEL  PROTIME-INR  TROPONIN I   Results for orders placed during the hospital encounter of 12/11/11  CBC      Component Value Range   WBC 8.0  4.0 - 10.5 K/uL   RBC 4.12  3.87 - 5.11 MIL/uL   Hemoglobin 11.8 (*) 12.0 - 15.0 g/dL   HCT 09.8  11.9 - 14.7 %   MCV 88.1  78.0 - 100.0 fL   MCH 28.6  26.0 - 34.0 pg   MCHC 32.5  30.0 - 36.0 g/dL   RDW 82.9  56.2 - 13.0 %   Platelets 224  150 - 400 K/uL  COMPREHENSIVE METABOLIC PANEL      Component Value Range   Sodium 138  135 - 145 mEq/L   Potassium 3.9  3.5 - 5.1 mEq/L   Chloride 99  96 - 112 mEq/L   CO2 31  19 - 32 mEq/L   Glucose, Bld 137 (*) 70 - 99 mg/dL   BUN 16  6 - 23 mg/dL   Creatinine, Ser 8.65  0.50 - 1.10 mg/dL   Calcium 78.4  8.4 - 69.6 mg/dL   Total Protein 6.2  6.0 - 8.3 g/dL   Albumin 3.2 (*) 3.5 - 5.2 g/dL   AST 13  0 - 37 U/L   ALT 9  0 - 35 U/L   Alkaline Phosphatase 70  39 - 117 U/L   Total Bilirubin 0.2 (*) 0.3 - 1.2 mg/dL   GFR calc non Af Amer 57 (*) >90 mL/min   GFR calc Af Amer 66 (*) >90 mL/min  PROTIME-INR      Component Value Range   Prothrombin Time 14.1  11.6 - 15.2 seconds   INR 1.07  0.00 - 1.49  TROPONIN I      Component Value Range   Troponin I <0.30  <0.30 ng/mL   Dg Chest Port 1 View  12/11/2011  *RADIOLOGY REPORT*  Clinical Data: Chest pain.  COPD.Short of breath.   PORTABLE CHEST - 1 VIEW  Comparison: 08/25/2011.  Findings: Stable appearance of the chest.  Chronic interstitial changes, emphysema and pulmonary parenchymal scarring appears similar.  No airspace consolidation.  Mild left basilar atelectasis. No pneumothorax. Monitoring leads are projected over the chest.  CABG.  IMPRESSION: No acute abnormality.  Stable appearance of the chest aside from mild left basilar atelectasis.  Original Report Authenticated By: Andreas Newport, M.D.      MDM  Iv ns. Labs. Monitor. Ecg. Cxr.   Reviewed nursing notes and prior charts for additional history.    Date: 12/11/2011  Rate: 108  Rhythm: atrial fibrillation  QRS Axis: normal  Intervals: afib  ST/T Wave abnormalities: nonspecific ST changes  Conduction Disutrbances:none  Narrative Interpretation:   Old EKG Reviewed: changes noted   Pt in afib, hx afib.  Recheck hr ranging 120-140, afib. cardizem bolus and gtt.  CRITICAL CARE Performed by: Suzi Roots   Total critical care time: 35  Critical care time was exclusive of separately billable procedures and treating other patients.  Critical care was necessary to treat or prevent imminent or life-threatening deterioration.  Critical care was time spent personally by me on the following activities: development of treatment plan with patient and/or surrogate as well as nursing,  discussions with consultants, evaluation of patient's response to treatment, examination of patient, obtaining history from patient or surrogate, ordering and performing treatments and interventions, ordering and review of laboratory studies, ordering and review of radiographic studies, pulse oximetry and re-evaluation of patient's condition.   Recheck, no cp currently.  Given hx cad, cp, uncontrolled afib, pts card service called.   Discussed w [facility] card - Dr Daleen Squibb, they will see/admit.   From prior card notes, anticoag previously held, despite afib, due to gib/avm,  therefore as cp free currently, will hold on hep/lovenox until card eval in ed.        Suzi Roots, MD 12/11/11 4098  Suzi Roots, MD 12/11/11 1191  Suzi Roots, MD 12/11/11 [phone]

## 2011-12-11 NOTE — ED Notes (Signed)
Pt gave verbal consent for daughter in law to provide number if she is admitted. Rosey Bath [phone]

## 2011-12-11 NOTE — ED Notes (Signed)
Aspirin already given by EMS 324 mg

## 2011-12-11 NOTE — ED Provider Notes (Signed)
Without a cardiology note was appreciated and case was discussed with Dr. Daleen Squibb. Patient will be discharged and her diltiazem dose increased to 240 mg a day.  Dione Booze, MD 12/11/11 1143

## 2012-01-04 ENCOUNTER — Telehealth: Payer: Self-pay | Admitting: Pulmonary Disease

## 2012-01-04 MED ORDER — LEVALBUTEROL TARTRATE 45 MCG/ACT IN AERO: 2.0000 | INHALATION_SPRAY | RESPIRATORY_TRACT | Status: DC | PRN

## 2012-01-04 NOTE — Telephone Encounter (Signed)
Pt notified that refill for Xopenox was sent to [facility] pharmacy.

## 2012-01-06 ENCOUNTER — Ambulatory Visit (HOSPITAL_COMMUNITY): Payer: Medicare Other

## 2012-01-07 ENCOUNTER — Encounter (HOSPITAL_COMMUNITY): Payer: Self-pay

## 2012-01-07 ENCOUNTER — Encounter (HOSPITAL_COMMUNITY)
Admission: RE | Admit: 2012-01-07 | Discharge: 2012-01-07 | Disposition: A | Discharge status: Home / Self Care | Payer: Medicare Other | Source: Ambulatory Visit | Attending: Pulmonary Disease | Admitting: Pulmonary Disease

## 2012-01-07 DIAGNOSIS — I509 Heart failure, unspecified: Secondary | ICD-10-CM | POA: Insufficient documentation

## 2012-01-07 DIAGNOSIS — I4891 Unspecified atrial fibrillation: Secondary | ICD-10-CM | POA: Insufficient documentation

## 2012-01-07 DIAGNOSIS — I2510 Atherosclerotic heart disease of native coronary artery without angina pectoris: Secondary | ICD-10-CM | POA: Insufficient documentation

## 2012-01-07 DIAGNOSIS — Z5189 Encounter for other specified aftercare: Secondary | ICD-10-CM | POA: Insufficient documentation

## 2012-01-07 DIAGNOSIS — J4489 Other specified chronic obstructive pulmonary disease: Secondary | ICD-10-CM | POA: Insufficient documentation

## 2012-01-07 DIAGNOSIS — J449 Chronic obstructive pulmonary disease, unspecified: Secondary | ICD-10-CM | POA: Insufficient documentation

## 2012-01-07 HISTORY — DX: Abdominal aortic aneurysm, without rupture: I71.4

## 2012-01-07 HISTORY — DX: Abdominal aortic aneurysm, without rupture, unspecified: I71.40

## 2012-01-07 NOTE — Progress Notes (Signed)
[name] came today for orientation to  Pulmonary Rehab. Health History and medications were reviewed.  Nurse assessment was done.  She stated her legs got tired very easily when she walked.  Distal pulses , very faint on right, faint on left.  +1-2 pitting edema bilateral lower extremities.  Breath sounds distant.  6 min walk test done, she was only able to walk 220 feet, stopped 1 min 35 sec of test. Oxygen levels were 93-95 % on 2 L continuous oxygen,  Demonstration and practice of PLB using pulse oximeter.  Patient able to return demonstration satisfactorily. Safety and hand hygiene in the exercise area reviewed with patient.  Patient voices understanding.  Counsel on smoking cessation.  Goals established for [facility] Re[facility] program.  She plans to start exercise on 01/12/12.  We look forward to assisting this lady with improving her quality of life.

## 2012-01-12 ENCOUNTER — Encounter (HOSPITAL_COMMUNITY)
Admission: RE | Admit: 2012-01-12 | Discharge: 2012-01-12 | Disposition: A | Discharge status: Home / Self Care | Payer: Medicare Other | Source: Ambulatory Visit | Attending: Pulmonary Disease | Admitting: Pulmonary Disease

## 2012-01-12 NOTE — Progress Notes (Signed)
First day of exercise in Pulmonary Rehab.  [name] was oriented to equipment use and safety, RPE and Dyspnea Scale and rest breaks.  Demonstration and practice of PLB on each exercise station.  Tolerated well. She did have tiredness in her legs when walking track.  Rested x 1 min after each lap.  Oxygen saturations stable on 2L in mid 90s during exercise.

## 2012-01-14 ENCOUNTER — Encounter (HOSPITAL_COMMUNITY)
Admission: RE | Admit: 2012-01-14 | Discharge: 2012-01-14 | Disposition: A | Discharge status: Home / Self Care | Payer: Medicare Other | Source: Ambulatory Visit | Attending: Pulmonary Disease | Admitting: Pulmonary Disease

## 2012-01-19 ENCOUNTER — Encounter (HOSPITAL_COMMUNITY)
Admission: RE | Admit: 2012-01-19 | Discharge: 2012-01-19 | Disposition: A | Discharge status: Home / Self Care | Payer: Medicare Other | Source: Ambulatory Visit | Attending: Pulmonary Disease | Admitting: Pulmonary Disease

## 2012-01-21 ENCOUNTER — Encounter (HOSPITAL_COMMUNITY)
Admission: RE | Admit: 2012-01-21 | Discharge: 2012-01-21 | Disposition: A | Discharge status: Home / Self Care | Payer: Medicare Other | Source: Ambulatory Visit | Attending: Pulmonary Disease | Admitting: Pulmonary Disease

## 2012-01-26 ENCOUNTER — Encounter (HOSPITAL_COMMUNITY): Payer: Medicare Other

## 2012-01-28 ENCOUNTER — Encounter (HOSPITAL_COMMUNITY)
Admission: RE | Admit: 2012-01-28 | Discharge: 2012-01-28 | Disposition: A | Discharge status: Home / Self Care | Payer: Medicare Other | Source: Ambulatory Visit | Attending: Pulmonary Disease | Admitting: Pulmonary Disease

## 2012-01-28 DIAGNOSIS — Z5189 Encounter for other specified aftercare: Secondary | ICD-10-CM | POA: Insufficient documentation

## 2012-01-28 DIAGNOSIS — I4891 Unspecified atrial fibrillation: Secondary | ICD-10-CM | POA: Insufficient documentation

## 2012-01-28 DIAGNOSIS — J4489 Other specified chronic obstructive pulmonary disease: Secondary | ICD-10-CM | POA: Insufficient documentation

## 2012-01-28 DIAGNOSIS — J449 Chronic obstructive pulmonary disease, unspecified: Secondary | ICD-10-CM | POA: Insufficient documentation

## 2012-01-28 DIAGNOSIS — I509 Heart failure, unspecified: Secondary | ICD-10-CM | POA: Insufficient documentation

## 2012-01-28 DIAGNOSIS — I2510 Atherosclerotic heart disease of native coronary artery without angina pectoris: Secondary | ICD-10-CM | POA: Insufficient documentation

## 2012-01-29 ENCOUNTER — Other Ambulatory Visit: Payer: Self-pay

## 2012-01-29 MED ORDER — DILTIAZEM HCL ER COATED BEADS 240 MG PO CP24: 240.0000 mg | ORAL_CAPSULE | Freq: Every day | ORAL | Status: DC

## 2012-02-01 ENCOUNTER — Telehealth: Payer: Self-pay | Admitting: Pulmonary Disease

## 2012-02-01 MED ORDER — PREDNISONE 10 MG PO TABS: ORAL_TABLET | ORAL | Status: DC

## 2012-02-01 MED ORDER — DOXYCYCLINE HYCLATE 100 MG PO TABS: 100.0000 mg | ORAL_TABLET | Freq: Two times a day (BID) | ORAL | Status: DC

## 2012-02-01 NOTE — Telephone Encounter (Signed)
I spoke with pt and she c/o cough w/ light yellow phlem, sneezing, chest burns when she coughs too hard, slight increase in her SOB but not much x 2 days. Denies any f/c/s/n/v. She refused OV stating she had no one to bring her in this week and she does not drive. She also stated she is very weak and would be hard for her to come in right now anyway's. She would like something called inf or her in the meantime. She states she can;t take anything OTC due to her BP medications and most meds OTC raises her BP. Since RA is scheduled off will forward to doc of the day for recs. Please advise MW thanks  Allergies  Allergen Reactions  . Codeine Other (See Comments)    unknown  . Penicillins Other (See Comments)    unknown  . Ranitidine Hcl Other (See Comments)    unknown  . Sulfonamide Derivatives     REACTION: nausea

## 2012-02-01 NOTE — Telephone Encounter (Signed)
Ok to call in  Prednisone 10 mg take  4 each am x 2 days,   2 each am x 2 days,  1 each am x2days and stop  And doxy 100 twice daily x 7 days However, she's on acei which is likely making the symptoms much worse and needs to be tried on alternative rx which will require ov to work out

## 2012-02-01 NOTE — Telephone Encounter (Signed)
Pt informed of Dr Thurston Hole recommendations and that rx was sent to Pharmacy.

## 2012-02-02 ENCOUNTER — Encounter (HOSPITAL_COMMUNITY): Payer: Medicare Other

## 2012-02-04 ENCOUNTER — Telehealth: Payer: Self-pay | Admitting: Pulmonary Disease

## 2012-02-04 ENCOUNTER — Encounter (HOSPITAL_COMMUNITY): Payer: Medicare Other

## 2012-02-04 NOTE — Telephone Encounter (Signed)
I spoke with pt and she stated she is still coughing-now gets up clear to yellow phlem. She will finish pred taper on Monday and will finish doxycyline on Sunday. She stated she has not been told to take any cough syrup. I advised her she could take delsym OTC. She is scheduled to see TP on Monday 02/08/12 at 3:30. She stated she will try the delsym and see if it helps with the cough. Will forward to RA so he is aware of this.

## 2012-02-06 ENCOUNTER — Emergency Department (HOSPITAL_COMMUNITY): Payer: Medicare Other

## 2012-02-06 ENCOUNTER — Encounter (HOSPITAL_COMMUNITY): Payer: Self-pay | Admitting: Emergency Medicine

## 2012-02-06 ENCOUNTER — Observation Stay (HOSPITAL_COMMUNITY)
Admission: EM | Admit: 2012-02-06 | Discharge: 2012-02-07 | Disposition: A | Discharge status: Home / Self Care | Payer: Medicare Other | Attending: Internal Medicine | Admitting: Internal Medicine

## 2012-02-06 DIAGNOSIS — Z7982 Long term (current) use of aspirin: Secondary | ICD-10-CM | POA: Insufficient documentation

## 2012-02-06 DIAGNOSIS — E871 Hypo-osmolality and hyponatremia: Secondary | ICD-10-CM | POA: Insufficient documentation

## 2012-02-06 DIAGNOSIS — I872 Venous insufficiency (chronic) (peripheral): Secondary | ICD-10-CM | POA: Insufficient documentation

## 2012-02-06 DIAGNOSIS — Z951 Presence of aortocoronary bypass graft: Secondary | ICD-10-CM | POA: Insufficient documentation

## 2012-02-06 DIAGNOSIS — I509 Heart failure, unspecified: Secondary | ICD-10-CM | POA: Insufficient documentation

## 2012-02-06 DIAGNOSIS — I2510 Atherosclerotic heart disease of native coronary artery without angina pectoris: Secondary | ICD-10-CM | POA: Insufficient documentation

## 2012-02-06 DIAGNOSIS — I714 Abdominal aortic aneurysm, without rupture, unspecified: Secondary | ICD-10-CM | POA: Insufficient documentation

## 2012-02-06 DIAGNOSIS — I712 Thoracic aortic aneurysm, without rupture, unspecified: Secondary | ICD-10-CM | POA: Insufficient documentation

## 2012-02-06 DIAGNOSIS — I10 Essential (primary) hypertension: Secondary | ICD-10-CM | POA: Insufficient documentation

## 2012-02-06 DIAGNOSIS — J441 Chronic obstructive pulmonary disease with (acute) exacerbation: Secondary | ICD-10-CM | POA: Insufficient documentation

## 2012-02-06 DIAGNOSIS — F17200 Nicotine dependence, unspecified, uncomplicated: Secondary | ICD-10-CM | POA: Insufficient documentation

## 2012-02-06 DIAGNOSIS — E785 Hyperlipidemia, unspecified: Secondary | ICD-10-CM | POA: Insufficient documentation

## 2012-02-06 DIAGNOSIS — J9620 Acute and chronic respiratory failure, unspecified whether with hypoxia or hypercapnia: Principal | ICD-10-CM | POA: Insufficient documentation

## 2012-02-06 DIAGNOSIS — Z79899 Other long term (current) drug therapy: Secondary | ICD-10-CM | POA: Insufficient documentation

## 2012-02-06 LAB — TROPONIN I: Troponin I: <0.3 ng/mL (ref <0.30)

## 2012-02-06 LAB — URINALYSIS, ROUTINE W REFLEX MICROSCOPIC
Bilirubin Urine: NEGATIVE
Ketones, ur: NEGATIVE mg/dL
Leukocytes, UA: NEGATIVE
Nitrite: NEGATIVE
Protein, ur: NEGATIVE mg/dL
Urobilinogen, UA: 1 mg/dL (ref 0.0–1.0)

## 2012-02-06 LAB — CBC
MCH: 29.7 pg (ref 26.0–34.0)
MCHC: 33.4 g/dL (ref 30.0–36.0)
RDW: 13.6 % (ref 11.5–15.5)

## 2012-02-06 LAB — CBC WITH DIFFERENTIAL/PLATELET
Basophils Absolute: 0 10*3/uL (ref 0.0–0.1)
Eosinophils Relative: 0 % (ref 0–5)
HCT: 40 % (ref 36.0–46.0)
Hemoglobin: 13.5 g/dL (ref 12.0–15.0)
Lymphocytes Relative: 16 % (ref 12–46)
MCHC: 33.8 g/dL (ref 30.0–36.0)
MCV: 88.3 fL (ref 78.0–100.0)
Monocytes Absolute: 0.8 10*3/uL (ref 0.1–1.0)
Monocytes Relative: 8 % (ref 3–12)
RDW: 13.5 % (ref 11.5–15.5)

## 2012-02-06 LAB — COMPREHENSIVE METABOLIC PANEL
AST: 14 U/L (ref 0–37)
BUN: 20 mg/dL (ref 6–23)
CO2: 34 mEq/L — ABNORMAL HIGH (ref 19–32)
Calcium: 10.3 mg/dL (ref 8.4–10.5)
Chloride: 93 mEq/L — ABNORMAL LOW (ref 96–112)
Creatinine, Ser: 1 mg/dL (ref 0.50–1.10)
GFR calc Af Amer: 60 mL/min — ABNORMAL LOW (ref >90)
GFR calc non Af Amer: 52 mL/min — ABNORMAL LOW (ref >90)
Total Bilirubin: 0.4 mg/dL (ref 0.3–1.2)

## 2012-02-06 LAB — PRO B NATRIURETIC PEPTIDE: Pro B Natriuretic peptide (BNP): 334.1 pg/mL (ref 0–450)

## 2012-02-06 MED ORDER — ASPIRIN EC 81 MG PO TBEC
81.0000 mg | DELAYED_RELEASE_TABLET | Freq: Every day | ORAL | Status: DC
  Administered 2012-02-07: 81 mg via ORAL
  Filled 2012-02-06 (×2): qty 1

## 2012-02-06 MED ORDER — LISINOPRIL 20 MG PO TABS
20.0000 mg | ORAL_TABLET | Freq: Every day | ORAL | Status: DC
  Administered 2012-02-06 – 2012-02-07 (×2): 20 mg via ORAL
  Filled 2012-02-06 (×2): qty 1

## 2012-02-06 MED ORDER — BISOPROLOL FUMARATE 10 MG PO TABS
10.0000 mg | ORAL_TABLET | Freq: Every day | ORAL | Status: DC
  Administered 2012-02-06 – 2012-02-07 (×2): 10 mg via ORAL
  Filled 2012-02-06 (×2): qty 1

## 2012-02-06 MED ORDER — DOXYCYCLINE HYCLATE 100 MG PO TABS
100.0000 mg | ORAL_TABLET | Freq: Two times a day (BID) | ORAL | Status: DC
  Administered 2012-02-06 – 2012-02-07 (×3): 100 mg via ORAL
  Filled 2012-02-06 (×4): qty 1

## 2012-02-06 MED ORDER — IPRATROPIUM BROMIDE 0.02 % IN SOLN
0.5000 mg | RESPIRATORY_TRACT | Status: DC
  Administered 2012-02-06 – 2012-02-07 (×6): 0.5 mg via RESPIRATORY_TRACT
  Filled 2012-02-06 (×5): qty 2.5

## 2012-02-06 MED ORDER — ATORVASTATIN CALCIUM 10 MG PO TABS
10.0000 mg | ORAL_TABLET | Freq: Every day | ORAL | Status: DC
  Administered 2012-02-06: 10 mg via ORAL
  Filled 2012-02-06 (×2): qty 1

## 2012-02-06 MED ORDER — IPRATROPIUM BROMIDE 0.02 % IN SOLN
1.0000 mg | Freq: Once | RESPIRATORY_TRACT | Status: AC
  Administered 2012-02-06: 1 mg via RESPIRATORY_TRACT
  Filled 2012-02-06: qty 5

## 2012-02-06 MED ORDER — ALBUTEROL (5 MG/ML) CONTINUOUS INHALATION SOLN
INHALATION_SOLUTION | RESPIRATORY_TRACT | Status: AC
  Administered 2012-02-06: 10 mg via RESPIRATORY_TRACT
  Filled 2012-02-06: qty 20

## 2012-02-06 MED ORDER — ASPIRIN 81 MG PO TABS: 81.0000 mg | ORAL_TABLET | Freq: Every day | ORAL | Status: DC

## 2012-02-06 MED ORDER — ENOXAPARIN SODIUM 40 MG/0.4ML ~~LOC~~ SOLN
40.0000 mg | SUBCUTANEOUS | Status: DC
  Administered 2012-02-06: 40 mg via SUBCUTANEOUS
  Filled 2012-02-06 (×2): qty 0.4

## 2012-02-06 MED ORDER — SODIUM CHLORIDE 0.9 % IV SOLN
INTRAVENOUS | Status: DC
  Administered 2012-02-06: 08:00:00 via INTRAVENOUS

## 2012-02-06 MED ORDER — SODIUM CHLORIDE 0.9 % IJ SOLN
3.0000 mL | Freq: Two times a day (BID) | INTRAMUSCULAR | Status: DC
  Administered 2012-02-06 – 2012-02-07 (×4): 3 mL via INTRAVENOUS

## 2012-02-06 MED ORDER — NITROGLYCERIN 0.4 MG SL SUBL: 0.4000 mg | SUBLINGUAL_TABLET | SUBLINGUAL | Status: DC | PRN

## 2012-02-06 MED ORDER — SODIUM CHLORIDE 0.9 % IJ SOLN
3.0000 mL | Freq: Two times a day (BID) | INTRAMUSCULAR | Status: DC
  Administered 2012-02-07: 3 mL via INTRAVENOUS

## 2012-02-06 MED ORDER — DILTIAZEM HCL ER COATED BEADS 240 MG PO CP24
240.0000 mg | ORAL_CAPSULE | Freq: Every day | ORAL | Status: DC
  Administered 2012-02-06 – 2012-02-07 (×2): 240 mg via ORAL
  Filled 2012-02-06 (×2): qty 1

## 2012-02-06 MED ORDER — LEVALBUTEROL HCL 0.63 MG/3ML IN NEBU
0.6300 mg | INHALATION_SOLUTION | RESPIRATORY_TRACT | Status: DC
  Administered 2012-02-06 – 2012-02-07 (×6): 0.63 mg via RESPIRATORY_TRACT
  Filled 2012-02-06 (×11): qty 3

## 2012-02-06 MED ORDER — METHYLPREDNISOLONE SODIUM SUCC 125 MG IJ SOLR
125.0000 mg | Freq: Once | INTRAMUSCULAR | Status: AC
  Administered 2012-02-06: 125 mg via INTRAVENOUS
  Filled 2012-02-06: qty 2

## 2012-02-06 MED ORDER — BUDESONIDE-FORMOTEROL FUMARATE 160-4.5 MCG/ACT IN AERO
2.0000 | INHALATION_SPRAY | Freq: Every day | RESPIRATORY_TRACT | Status: DC
  Administered 2012-02-06 – 2012-02-07 (×2): 2 via RESPIRATORY_TRACT
  Filled 2012-02-06: qty 6

## 2012-02-06 MED ORDER — PANTOPRAZOLE SODIUM 40 MG PO TBEC
40.0000 mg | DELAYED_RELEASE_TABLET | Freq: Every day | ORAL | Status: DC
  Administered 2012-02-06 – 2012-02-07 (×2): 40 mg via ORAL
  Filled 2012-02-06 (×2): qty 1

## 2012-02-06 MED ORDER — ALBUTEROL SULFATE (5 MG/ML) 0.5% IN NEBU
10.0000 mg | INHALATION_SOLUTION | Freq: Once | RESPIRATORY_TRACT | Status: AC
  Administered 2012-02-06: 10 mg via RESPIRATORY_TRACT

## 2012-02-06 MED ORDER — POTASSIUM CHLORIDE CRYS ER 20 MEQ PO TBCR
20.0000 meq | EXTENDED_RELEASE_TABLET | Freq: Every day | ORAL | Status: DC
  Administered 2012-02-06 – 2012-02-07 (×2): 20 meq via ORAL
  Filled 2012-02-06 (×2): qty 1

## 2012-02-06 MED ORDER — DOCUSATE SODIUM 100 MG PO CAPS
100.0000 mg | ORAL_CAPSULE | Freq: Two times a day (BID) | ORAL | Status: DC
  Administered 2012-02-06 – 2012-02-07 (×3): 100 mg via ORAL
  Filled 2012-02-06 (×4): qty 1

## 2012-02-06 MED ORDER — FUROSEMIDE 40 MG PO TABS
40.0000 mg | ORAL_TABLET | Freq: Every day | ORAL | Status: DC
  Administered 2012-02-06 – 2012-02-07 (×2): 40 mg via ORAL
  Filled 2012-02-06 (×2): qty 1

## 2012-02-06 MED ORDER — LEVALBUTEROL HCL 0.63 MG/3ML IN NEBU
0.6300 mg | INHALATION_SOLUTION | Freq: Four times a day (QID) | RESPIRATORY_TRACT | Status: DC | PRN
  Filled 2012-02-06: qty 3

## 2012-02-06 NOTE — H&P (Signed)
Date: 02/06/2012               Patient Name:  [name] MRN: [id number]  DOB: [birth year] Age / Sex: 77 y.o., female   PCP: Kaleen Mask              Medical Service: Internal Medicine Teaching Service              Attending Physician: Dr. Orvan Falconer    First Contact: Dr. Elenor Legato Pager: [phone]  Second Contact: Dr. Almyra Deforest Pager: [phone]            After Hours (After 5p/  First Contact Pager: [phone]  weekends / holidays): Second Contact Pager: [phone]     Chief Complaint: Shortness of breath  History of Present Illness: Patient is a 78 y.o. female with a PMHx of COPD (on home O2), CAD s/p CABG 2005, CHF, paroxysmal A fib, who presents to [facility] for evaluation of SOB, cough. Patient states symptoms began 6 days prior to admission on 01/31/2012. She states that at that time she began to develop a worsening cough with yellow sputum expectoration, which was accompanied by SOB which is present at rest. She is followed for her COPD by Dr. Vassie Loll of [facility], and called the office on 02/01/2012 to make them aware of her symptoms. She was prescirbed a 6-day prednisone taper and a 7-day course of doxycycline, which she states she filled the same day and has been taking as prescribed in there interim between that time and her presentation to the ED on 10/12. She states that her sputum has improved in color from yellow to white/clear in the last week, and that her SOB has improved, although she believes she is still more SOB than at baseline. Patient admits to her daughter-in-law recently having a cold, but otherwise denies any sick contacts or any recent illness prior to the onset of cough/SOB.  [name] is on 2L O2 by nasal cannula at home at her baseline. She states she has not had to recently increase her O2 in the setting of her increased SOB. She states that the SOB has made it more difficult to ambulate around her house and perform daily activities. Her  son and daughter-in-law live immediately behind her own house, and her daughter-in-law does all shopping for her, however the patient usually is able to attend to all issues involving care of herself and the interior of her home, per her report.   The patient states she has as follow-up appointment scheduled with [facility] on 02/08/2012. Patient sees [facility] Jeannetta Nap [facility] as her PCP. She states she has been using all of her medications as prescribed, aside from her xopenex inhaler which she has been using more frequently than usual for the past week. Patient was recently enrolled in Pulmonary Rehabilitation beginning on 01/07/2012, however she states that her attendance has been irregular.   Review of Systems: Constitutional:  admits to fever, chills, malaise, mildly decreased appetite.  Respiratory: admits to SOB, cough, chest tightness, and wheezing.  Cardiovascular: denies chest pain, palpitations and leg swelling.  Gastrointestinal: denies nausea, vomiting, abdominal pain, diarrhea.  Genitourinary: denies dysuria, hematuria.  Neurological: admits to weakness    Current Outpatient Medications: Medication Sig Dispense Refill  . aspirin 81 MG tablet Take 81 mg by mouth daily.        Marland Kitchen atorvastatin (LIPITOR) 10 MG tablet Take 1 tablet (10 mg total) by mouth at bedtime.  90 tablet  4  . bisoprolol (ZEBETA) 10 MG tablet Take 1 tablet (10 mg total) by mouth daily.  90 tablet  4  . budesonide-formoterol (SYMBICORT) 160-4.5 MCG/ACT inhaler Inhale 2 puffs into the lungs daily.       Marland Kitchen diltiazem (CARTIA XT) 240 MG 24 hr capsule Take 1 capsule (240 mg total) by mouth daily.  30 capsule  5  . docusate sodium (COLACE) 100 MG capsule Take 100 mg by mouth 2 (two) times daily.        Marland Kitchen doxycycline (VIBRA-TABS) 100 MG tablet Take 1 tablet (100 mg total) by mouth 2 (two) times daily.  14 tablet  0  . ergocalciferol (VITAMIN D2) 50000 UNITS capsule 1 capsule by mouth every other week      .  furosemide (LASIX) 40 MG tablet Take 1 tablet (40 mg total) by mouth daily.  90 tablet  4  . hydrOXYzine (VISTARIL) 25 MG capsule Take 1 tablet by mouth Every 6 hours as needed.      . IPRATROPIUM BROMIDE NA Place 2 sprays into the nose as needed. For allergies      . levalbuterol (XOPENEX HFA) 45 MCG/ACT inhaler Inhale 2 puffs into the lungs every 4 (four) hours as needed for wheezing.  1 Inhaler  3  . lisinopril (PRINIVIL,ZESTRIL) 20 MG tablet Take 1 tablet (20 mg total) by mouth daily.  90 tablet  4  . nitroGLYCERIN (NITROSTAT) 0.4 MG SL tablet Place 1 tablet (0.4 mg total) under the tongue every 5 (five) minutes x 3 doses as needed for chest pain.  25 tablet  4  . pantoprazole (PROTONIX) 40 MG tablet Take 1 tablet (40 mg total) by mouth daily.  90 tablet  3  . potassium chloride SA (K-DUR,KLOR-CON) 20 MEQ tablet Take 1 tablet (20 mEq total) by mouth daily.      . predniSONE (DELTASONE) 10 MG tablet Take 4 tabs a day for 2 days, 2 tablets a day for 2 days, 1 tablet a day for 2 days then stop.  15 tablet  0  . selenium sulfide (SELSUN) 2.5 % shampoo Apply 1 application topically daily as needed. For dermatitis      . sennosides-docusate sodium (SENOKOT-S) 8.6-50 MG tablet Take 1 tablet by mouth daily.      Marland Kitchen tiotropium (SPIRIVA) 18 MCG inhalation capsule Place 18 mcg into inhaler and inhale daily.          Allergies: Allergies  Allergen Reactions  . Codeine Other (See Comments)    unknown  . Penicillins Other (See Comments)    unknown  . Ranitidine Hcl Other (See Comments)    unknown  . Sulfonamide Derivatives     REACTION: nausea     Past Medical History: Past Medical History  Diagnosis Date  . CAD (coronary artery disease)     s/p CABG x5 (2005)   . Hypertension   . Hyperlipidemia   . GI bleed     AVMs  . Aneurysm, aortic     thoracic aorta, stable at 4.1 cm, chest CT, May, 2012  . Syncope     Nitroglycerin plus a diuretic, April, 2009  . Hyponatremia     Chronic. Felt  secondary to SIADH   . COPD (chronic obstructive pulmonary disease)   . Tobacco abuse   . Bradycardia   . Carotid artery disease     Hx of endarterectomy. Doppler October, 2011, stable, 0-39% RIC A., 40-59% LICA  . Tuberculosis     Exposures  to tuberculosis 1970s, has tested negative by the health Department  . Atrial fibrillation     Paroxysmal with RVR 07/2011, not felt to be a coumadin candidate secondary to hx of GIB/AVM  . Venous stasis of lower extremity     Chronic  . Ejection fraction     EF 55-60%, echo, April, 2013  . CHF with left ventricular diastolic dysfunction, NYHA class 2     Diastolic dysfunction  . AAA (abdominal aortic aneurysm)   . On home O2     2L [place]    Past Surgical History: Past Surgical History  Procedure Date  . Cholecystectomy   . Carotid endartercetomy   . Abdominal hysterectomy   . Coronary artery bypass graft 2005    Family History: Family History  Problem Relation Age of Onset  . Stroke Sister   . Heart failure Mother   . Cancer Sister     Breast and Lung   . Cancer Brother     breast cancer    Social History: History   Social History  . Marital Status: Widowed    Spouse Name: N/A    Number of Children: 1  . Years of Education: N/A   Occupational History  . retired     [employer]   Social History Main Topics  . Smoking status: Current Every Day Smoker -- 0.5 packs/day for 59 years    Types: Cigarettes  . Smokeless tobacco: Not on file   Comment: has had 2-3 cigs since  . Alcohol Use: No  . Drug Use: Not on file  . Sexually Active: Not on file   Other Topics Concern  . Not on file   Social History Narrative  . No narrative on file     Vital Signs: Blood pressure 128/52, pulse 82, resp. rate 17, SpO2 92.00%.  Physical Exam: General: Vital signs reviewed and noted. Well-developed, well-nourished, in mild discomfort secondary to dyspnea.   Head: Normocephalic, atraumatic. K[place] in place, running at 3L.   Eyes: PERRL,  EOMI, No signs of anemia or jaundice.  Nose: Mucous membranes moist, not inflammed, nonerythematous.  Throat: Oropharynx nonerythematous, no exudate appreciated.   Neck: No deformities, masses, or tenderness noted.  Lungs:  Normal respiratory effort. Decreased breath sounds in all lung fields.   Heart: RRR. S1 and S2 normal without gallop, murmur, or rubs.  Abdomen:  BS normoactive. Soft, Nondistended, non-tender.  No masses or organomegaly.  Extremities: No pretibial edema.  Neurologic: A&O X3, CN II - XII are grossly intact. Motor strength is 5/5 in the all 4 extremities, Sensations intact to light touch, Cerebellar signs negative.   Lab results: Basic Metabolic Panel:  [facility] 02/06/12 0747  NA 134*  K 3.8  CL 93*  CO2 34*  GLUCOSE 121*  BUN 20  CREATININE 1.00  CALCIUM 10.3  MG --  PHOS --   Liver Function Tests:  [facility] 02/06/12 0747  AST 14  ALT 14  ALKPHOS 84  BILITOT 0.4  PROT 6.5  ALBUMIN 3.7   CBC:  Basename 02/06/12 0747  WBC 10.1  NEUTROABS 7.7  HGB 13.5  HCT 40.0  MCV 88.3  PLT 222   Cardiac Enzymes:  Basename 02/06/12 0747  CKTOTAL --  CKMB --  CKMBINDEX --  TROPONINI <0.30   BNP:  Basename 02/06/12 0747  PROBNP 334.1   Urinalysis:  Basename 02/06/12 0932  COLORURINE YELLOW  LABSPEC 1.022  PHURINE 7.0  GLUCOSEU NEGATIVE  HGBUR NEGATIVE  BILIRUBINUR NEGATIVE  KETONESUR NEGATIVE  PROTEINUR NEGATIVE  UROBILINOGEN 1.0  NITRITE NEGATIVE  LEUKOCYTESUR NEGATIVE    Imaging results:  Dg Chest 2 View  02/06/2012  *RADIOLOGY REPORT*  Clinical Data: Cough  CHEST - 2 VIEW  Comparison: 12/11/2011  Findings: Hyperinflation indicates COPD.  There is mild cardiac silhouette enlargement.  The vascular pattern is normal.  There is no infiltrate or effusion.  There is no significant change compared to the prior examination.  IMPRESSION:  COPD.  No acute findings.   Original Report Authenticated By: Otilio Carpen, M.D.      Other  results:  EKG (02/06/2012) - Normal Sinus Rhythm, regular rate of approximately 70 bpm, normal axis, ST segments: nonspecific ST changes.     Assessment & Plan: Pt is a 78 y.o. yo female with a PMHx of COPD (on home O2), CAD s/p CABG 2005, CHF, paroxysmal A fib who was admitted on 02/06/2012 with symptoms of SOB, cough, which was determined to be secondary to COPD exacerbation.  COPD exacerbation - patient presents with cough with increased sputum volume, purulence, and dyspnea. She thus has 3/3 cardinal features of a moderate/severe COPD exacerbation, and antibiotic therapy is warranted. O2 sat mid-90s on 2L [place] (home O2 requirement). At time of admission patient on day 6/7 for antibiotic therapy with doxycycline 100mg  bid, and was on day 6/6 of prednisone taper. Patient was admitted for a COPD exacerbation most recently in 06/2011. Other causes of SOB are less likely. Community-acquired PNA unlikely given negative CXR and 0/4 SIRS criteria (although temperature apparently not checked in ED).  Patient has history of CHF, however exacerbation unlikely as proBNP wnl and no signs/symptoms of hypervolemia. ACS unlikely given no CP, no new EKG abnormalities, and troponin negative in the ED.  - admit to floor - monitor vitals - ipratropium/xopenex nebs q4h - solumedrol IV 125mg  in ED (will constitute d 6/6 of corticosteroid therapy) - cont doxycycline 100mg  bid (currently d 6/7) - O2 via [place] (O2 sats >92%)  CHF - stable on admission. No signs/symptoms consistent with hypervolemia.  No pretibial edema or pulmonary rales . ProBNP wnl. 2d echo in 07/2011 revealed LVEF 55-60% with no diastolic dysfunction. Followed by Dr. Myrtis Ser as an outpatient.- cont diltiazem (XR) 240mg  qdaily - cont lasix 40mg  qdaily - cont lisinopril - cont bisoprolol  CAD - stable.  - cont lipitor - cont bisoprolol - cont lisinopril  Hypertension - Stable, with occasional hypertension in ED, likely 2/2 discomfort  (115-173/49-52). - cont diltiazem (XR) 240mg  qdaily - cont lasix 40mg  qdaily - cont lisinopril - cont bisoprolol  Paroxysmal A fib - patient in NSR at time of admission. HR ~60-80 in ED. Noted to have paroxysmal A fib on previous admission in 06/2011. States she usually has a "fluttering" sensation in her chest when having A fib in the past, and denies any such palpitations over the last week since symptoms began. Has a history of GI bleed from AVM, and thus not started on anticoagulation at time of hospital follow-up with [facility] on 08/26/2011. - cont diltiazem - cont bisoprolol   DVT PPX - lovenox   Signed: Elenor Legato, M.D. PGY-I, Internal Medicine Resident Pager: [phone] (7AM-5PM) 02/06/2012, 11:21 AM

## 2012-02-06 NOTE — ED Provider Notes (Signed)
History     CSN: [id number]  Arrival date & time 02/06/12  2130   First MD Initiated Contact with Patient 02/06/12 [phone]      Chief Complaint  Patient presents with  . Cough    HPI Pt was seen at 0720.  Per pt, c/o gradual onset and worsening of persistent SOB, moist cough, wheezing and chest "tightness" for the past 5 days. Pt states she called her Pulm MD, rx PO steroid.  States she has been taking prednisone and using her MDI without relief. Symptoms worsen with ambulation and laying flat.  Have been associated with generalized weakness/fatigue.  Pt was given neb by EMS en route without relief.  Denies palpitations, no back pain, no abd pain, no N/V/D, no fevers.    PMD: [facility] (Dr. Esperanza Sheets) Past Medical History  Diagnosis Date  . CAD (coronary artery disease)     s/p CABG x5 (2005)   . Hypertension   . Hyperlipidemia   . GI bleed     AVMs  . Aneurysm, aortic     thoracic aorta, stable at 4.1 cm, chest CT, May, 2012  . Syncope     Nitroglycerin plus a diuretic, April, 2009  . Hyponatremia     Chronic. Felt secondary to SIADH   . COPD (chronic obstructive pulmonary disease)   . Tobacco abuse   . Bradycardia   . Carotid artery disease     Hx of endarterectomy. Doppler October, 2011, stable, 0-39% RIC A., 40-59% LICA  . Tuberculosis     Exposures to tuberculosis 1970s, has tested negative by the health Department  . Atrial fibrillation     Paroxysmal with RVR 07/2011, not felt to be a coumadin candidate secondary to hx of GIB/AVM  . Venous stasis of lower extremity     Chronic  . Ejection fraction     EF 55-60%, echo, April, 2013  . CHF with left ventricular diastolic dysfunction, NYHA class 2     Diastolic dysfunction  . AAA (abdominal aortic aneurysm)   . On home O2     2L N/C    Past Surgical History  Procedure Date  . Cholecystectomy   . Carotid endartercetomy   . Abdominal hysterectomy   . Coronary artery bypass graft 2005     History   Substance Use Topics  . Smoking status: Current Every Day Smoker -- 0.5 packs/day for 59 years    Types: Cigarettes  . Smokeless tobacco: Not on file   Comment: has had 2-3 cigs since  . Alcohol Use: No      Review of Systems ROS: Statement: All systems negative except as marked or noted in the HPI; Constitutional: +generalized weakness. Negative for fever and chills. ; ; Eyes: Negative for eye pain, redness and discharge. ; ; ENMT: Negative for ear pain, hoarseness, nasal congestion, sinus pressure and sore throat. ; ; Cardiovascular: Negative for palpitations, diaphoresis and peripheral edema. ; ; Respiratory: +SOB, cough, wheezing, CP. Negative for stridor. ; ; Gastrointestinal: Negative for nausea, vomiting, diarrhea, abdominal pain, blood in stool, hematemesis, jaundice and rectal bleeding. . ; ; Genitourinary: Negative for dysuria, flank pain and hematuria. ; ; Musculoskeletal: Negative for back pain and neck pain. Negative for swelling and trauma.; ; Skin: Negative for pruritus, rash, abrasions, blisters, bruising and skin lesion.; ; Neuro: Negative for headache, lightheadedness and neck stiffness. Negative for altered level of consciousness , altered mental status, extremity weakness, paresthesias, involuntary movement, seizure and syncope.  Allergies  Codeine; Penicillins; Ranitidine hcl; and Sulfonamide derivatives  Home Medications   Current Outpatient Rx  Name Route Sig Dispense Refill  . ASPIRIN 81 MG PO TABS Oral Take 81 mg by mouth daily.      . ATORVASTATIN CALCIUM 10 MG PO TABS Oral Take 1 tablet (10 mg total) by mouth at bedtime. 90 tablet 4  . BISOPROLOL FUMARATE 10 MG PO TABS Oral Take 1 tablet (10 mg total) by mouth daily. 90 tablet 4  . BUDESONIDE-FORMOTEROL FUMARATE 160-4.5 MCG/ACT IN AERO Inhalation Inhale 2 puffs into the lungs daily.     Marland Kitchen DILTIAZEM HCL ER COATED BEADS 240 MG PO CP24 Oral Take 1 capsule (240 mg total) by mouth daily. 30 capsule 5  .  DOCUSATE SODIUM 100 MG PO CAPS Oral Take 100 mg by mouth 2 (two) times daily.      Marland Kitchen DOXYCYCLINE HYCLATE 100 MG PO TABS Oral Take 1 tablet (100 mg total) by mouth 2 (two) times daily. 14 tablet 0  . ERGOCALCIFEROL 50000 UNITS PO CAPS  1 capsule by mouth every other week    . FUROSEMIDE 40 MG PO TABS Oral Take 1 tablet (40 mg total) by mouth daily. 90 tablet 4  . HYDROXYZINE PAMOATE 25 MG PO CAPS Oral Take 1 tablet by mouth Every 6 hours as needed.    . IPRATROPIUM BROMIDE NA Nasal Place 2 sprays into the nose as needed. For allergies    . LEVALBUTEROL TARTRATE 45 MCG/ACT IN AERO Inhalation Inhale 2 puffs into the lungs every 4 (four) hours as needed for wheezing. 1 Inhaler 3    Follow up with your pulmonary doctor for this medi ...  . LISINOPRIL 20 MG PO TABS Oral Take 1 tablet (20 mg total) by mouth daily. 90 tablet 4  . NITROGLYCERIN 0.4 MG SL SUBL Sublingual Place 1 tablet (0.4 mg total) under the tongue every 5 (five) minutes x 3 doses as needed for chest pain. 25 tablet 4  . PANTOPRAZOLE SODIUM 40 MG PO TBEC Oral Take 1 tablet (40 mg total) by mouth daily. 90 tablet 3  . POTASSIUM CHLORIDE CRYS ER 20 MEQ PO TBCR Oral Take 1 tablet (20 mEq total) by mouth daily.    Marland Kitchen PREDNISONE 10 MG PO TABS  Take 4 tabs a day for 2 days, 2 tablets a day for 2 days, 1 tablet a day for 2 days then stop. 15 tablet 0  . SELENIUM SULFIDE 2.5 % EX LOTN Topical Apply 1 application topically daily as needed. For dermatitis    . SENNA-DOCUSATE SODIUM 8.6-50 MG PO TABS Oral Take 1 tablet by mouth daily.    Marland Kitchen TIOTROPIUM BROMIDE MONOHYDRATE 18 MCG IN CAPS Inhalation Place 18 mcg into inhaler and inhale daily.        BP 167/60  Pulse 64  Resp 20  SpO2 95%  Physical Exam 0725: Physical examination:  Nursing notes reviewed; Vital signs and O2 SAT reviewed;  Constitutional: Well developed, Well nourished, In no acute distress; Head:  Normocephalic, atraumatic; Eyes: EOMI, PERRL, No scleral icterus; ENMT: Mouth and  pharynx normal, Mucous membranes dry; Neck: Supple, Full range of motion, No lymphadenopathy; Cardiovascular: Regular rate and rhythm, No gallop; Respiratory: Breath sounds coarse, diminished & equal bilaterally, No wheezes.  Speaking in long phrases, sitting upright. Normal respiratory effort/excursion; Chest: Nontender, Movement normal; Abdomen: Soft, Nontender, Nondistended, Normal bowel sounds;; Extremities: Pulses normal, No tenderness, No edema, No calf edema or asymmetry.; Neuro: AA&Ox3, Major CN grossly  intact.  Speech clear. No gross focal motor or sensory deficits in extremities.; Skin: Color normal, Warm, Dry.   ED Course  Procedures    MDM  MDM Reviewed: previous chart, nursing note and vitals Reviewed previous: ECG Interpretation: labs, ECG and x-ray Total time providing critical care: 30-74 minutes. This excludes time spent performing separately reportable procedures and services. Consults: admitting MD   CRITICAL CARE Performed by: Laray Anger Total critical care time: 35 Critical care time was exclusive of separately billable procedures and treating other patients. Critical care was necessary to treat or prevent imminent or life-threatening deterioration. Critical care was time spent personally by me on the following activities: development of treatment plan with patient and/or surrogate as well as nursing, discussions with consultants, evaluation of patient's response to treatment, examination of patient, obtaining history from patient or surrogate, ordering and performing treatments and interventions, ordering and review of laboratory studies, ordering and review of radiographic studies, pulse oximetry and re-evaluation of patient's condition.      Date: 02/06/2012  Rate: 67  Rhythm: normal sinus rhythm  QRS Axis: normal  Intervals: normal  ST/T Wave abnormalities: nonspecific T wave changes, TWI aVL  Conduction Disutrbances:none  Narrative Interpretation:    Old EKG Reviewed: unchanged; no significant changes, including TWI aVL, from previous EKG dated 12/11/2011.    Dg Chest 2 View 02/06/2012  *RADIOLOGY REPORT*  Clinical Data: Cough  CHEST - 2 VIEW  Comparison: 12/11/2011  Findings: Hyperinflation indicates COPD.  There is mild cardiac silhouette enlargement.  The vascular pattern is normal.  There is no infiltrate or effusion.  There is no significant change compared to the prior examination.  IMPRESSION:  COPD.  No acute findings.   Original Report Authenticated By: Otilio Carpen, M.D.     Results for orders placed during the hospital encounter of 02/06/12  CBC WITH DIFFERENTIAL      Component Value Range   WBC 10.1  4.0 - 10.5 K/uL   RBC 4.53  3.87 - 5.11 MIL/uL   Hemoglobin 13.5  12.0 - 15.0 g/dL   HCT 16.1  09.6 - 04.5 %   MCV 88.3  78.0 - 100.0 fL   MCH 29.8  26.0 - 34.0 pg   MCHC 33.8  30.0 - 36.0 g/dL   RDW 40.9  81.1 - 91.4 %   Platelets 222  150 - 400 K/uL   Neutrophils Relative 76  43 - 77 %   Neutro Abs 7.7  1.7 - 7.7 K/uL   Lymphocytes Relative 16  12 - 46 %   Lymphs Abs 1.6  0.7 - 4.0 K/uL   Monocytes Relative 8  3 - 12 %   Monocytes Absolute 0.8  0.1 - 1.0 K/uL   Eosinophils Relative 0  0 - 5 %   Eosinophils Absolute 0.0  0.0 - 0.7 K/uL   Basophils Relative 0  0 - 1 %   Basophils Absolute 0.0  0.0 - 0.1 K/uL  COMPREHENSIVE METABOLIC PANEL      Component Value Range   Sodium 134 (*) 135 - 145 mEq/L   Potassium 3.8  3.5 - 5.1 mEq/L   Chloride 93 (*) 96 - 112 mEq/L   CO2 34 (*) 19 - 32 mEq/L   Glucose, Bld 121 (*) 70 - 99 mg/dL   BUN 20  6 - 23 mg/dL   Creatinine, Ser 7.82  0.50 - 1.10 mg/dL   Calcium 95.6  8.4 - 21.3 mg/dL   Total Protein  6.5  6.0 - 8.3 g/dL   Albumin 3.7  3.5 - 5.2 g/dL   AST 14  0 - 37 U/L   ALT 14  0 - 35 U/L   Alkaline Phosphatase 84  39 - 117 U/L   Total Bilirubin 0.4  0.3 - 1.2 mg/dL   GFR calc non Af Amer 52 (*) >90 mL/min   GFR calc Af Amer 60 (*) >90 mL/min  TROPONIN I       Component Value Range   Troponin I <0.30  <0.30 ng/mL  PRO B NATRIURETIC PEPTIDE      Component Value Range   Pro B Natriuretic peptide (BNP) 334.1  0 - 450 pg/mL     0900:  Hour long neb and IV solumedrol given.  Pt now with moist cough productive of thick/yellow sputum, lungs diminished with faint scattered wheeze, Sats remain mid-90's on her O2 N/C.  Still sitting upright, but talking in full sentences.  Dx testing d/w pt.  Questions answered.  Verb understanding, agreeable to admit.  0915:  T/C to [facility] Resident, case discussed, including:  HPI, pertinent PM/SHx, VS/PE, dx testing, ED course and treatment:  Agreeable to admit, they will come to ED for eval.        Laray Anger, DO 02/06/12 2018

## 2012-02-06 NOTE — ED Notes (Signed)
Per EMS, pt complained of productive cough for several day now, woke up this morning not feeling well, Gen body aches and pain, dizziness , pt stated called the PCP and the called in steroid and for her this early this week, EMS stated Bp was high when the arrived. Pt received total of 10 mg albuterol and 5 mg of Atrovent, 20ga PIV to LFA by EMS

## 2012-02-07 DIAGNOSIS — J441 Chronic obstructive pulmonary disease with (acute) exacerbation: Secondary | ICD-10-CM

## 2012-02-07 LAB — BASIC METABOLIC PANEL
Calcium: 10.5 mg/dL (ref 8.4–10.5)
GFR calc Af Amer: 72 mL/min — ABNORMAL LOW (ref >90)
GFR calc non Af Amer: 62 mL/min — ABNORMAL LOW (ref >90)
Glucose, Bld: 98 mg/dL (ref 70–99)
Potassium: 4.3 mEq/L (ref 3.5–5.1)
Sodium: 130 mEq/L — ABNORMAL LOW (ref 135–145)

## 2012-02-07 LAB — CBC
Hemoglobin: 12.8 g/dL (ref 12.0–15.0)
MCH: 29.2 pg (ref 26.0–34.0)
MCHC: 33.6 g/dL (ref 30.0–36.0)
Platelets: 215 10*3/uL (ref 150–400)
RBC: 4.38 MIL/uL (ref 3.87–5.11)

## 2012-02-07 NOTE — Progress Notes (Addendum)
Subjective: Patient states SOB is improved and approaching home baseline. States she is amenable for discharge today, and feels safe returning home.  No acute interval events.   Objective: Vital signs in last 24 hours: Filed Vitals:   02/07/12 0333 02/07/12 0437 02/07/12 0855 02/07/12 1055  BP:  171/79  143/57  Pulse:  69    Temp:  97.9 F (36.6 C)    TempSrc:  Oral    Resp:  20    Height:      Weight:  170 lb 6.7 oz (77.3 kg)    SpO2: 94% 93% 97%    Weight change:   Intake/Output Summary (Last 24 hours) at 02/07/12 1208 Last data filed at 02/06/12 2108  Gross per 24 hour  Intake    483 ml  Output   1100 ml  Net   -617 ml   Physical Exam: General: Awake and alert; NAD CV: Regular rate and rhythm; no murmurs, rubs, or gallops Resp: Mild diffuse wheezes bilaterally (interval improvement); normal work of breathing GI/Abd: Soft, non-tender, non-distended; normoactive bowel sounds Ext: 2+ pulses in all extremities; no edema, clubbing, or cyanosis Skin: Warm, dry, intact   Lab Results: Basic Metabolic Panel:  Lab 02/07/12 1610 02/06/12 0747  NA 130* 134*  K 4.3 3.8  CL 91* 93*  CO2 31 34*  GLUCOSE 98 121*  BUN 20 20  CREATININE 0.86 1.00  CALCIUM 10.5 10.3  MG -- --  PHOS -- --   Liver Function Tests:  Lab 02/06/12 0747  AST 14  ALT 14  ALKPHOS 84  BILITOT 0.4  PROT 6.5  ALBUMIN 3.7   CBC:  Lab 02/07/12 0645 02/06/12 1308 02/06/12 0747  WBC 13.5* 14.4* --  NEUTROABS -- -- 7.7  HGB 12.8 13.5 --  HCT 38.1 40.4 --  MCV 87.0 88.8 --  PLT 215 251 --   Cardiac Enzymes:  Lab 02/06/12 0747  CKTOTAL --  CKMB --  CKMBINDEX --  TROPONINI <0.30   BNP:  Lab 02/06/12 0747  PROBNP 334.1   Urinalysis:  Lab 02/06/12 0932  COLORURINE YELLOW  LABSPEC 1.022  PHURINE 7.0  GLUCOSEU NEGATIVE  HGBUR NEGATIVE  BILIRUBINUR NEGATIVE  KETONESUR NEGATIVE  PROTEINUR NEGATIVE  UROBILINOGEN 1.0  NITRITE NEGATIVE  LEUKOCYTESUR NEGATIVE    Micro  Results: Recent Results (from the past 240 hour(s))  URINE CULTURE     Status: Normal (Preliminary result)   Collection Time   02/06/12  9:32 AM      Component Value Range Status Comment   Specimen Description URINE, CLEAN CATCH   Final    Special Requests NONE   Final    Culture  Setup Time 02/06/2012 14:25   Final    Colony Count PENDING   Incomplete    Culture Culture reincubated for better growth   Final    Report Status PENDING   Incomplete    Studies/Results: Dg Chest 2 View  02/06/2012  *RADIOLOGY REPORT*  Clinical Data: Cough  CHEST - 2 VIEW  Comparison: 12/11/2011  Findings: Hyperinflation indicates COPD.  There is mild cardiac silhouette enlargement.  The vascular pattern is normal.  There is no infiltrate or effusion.  There is no significant change compared to the prior examination.  IMPRESSION:  COPD.  No acute findings.   Original Report Authenticated By: Otilio Carpen, M.D.    Medications: I have reviewed the patient's current medications. Scheduled Meds:   . aspirin EC  81 mg Oral Daily  .  atorvastatin  10 mg Oral QHS  . bisoprolol  10 mg Oral Daily  . budesonide-formoterol  2 puff Inhalation Daily  . diltiazem  240 mg Oral Daily  . docusate sodium  100 mg Oral BID  . doxycycline  100 mg Oral BID  . enoxaparin (LOVENOX) injection  40 mg Subcutaneous Q24H  . furosemide  40 mg Oral Daily  . ipratropium  0.5 mg Nebulization Q4H  . levalbuterol  0.63 mg Nebulization Q4H  . lisinopril  20 mg Oral Daily  . pantoprazole  40 mg Oral Daily  . potassium chloride SA  20 mEq Oral Daily  . sodium chloride  3 mL Intravenous Q12H  . sodium chloride  3 mL Intravenous Q12H  . DISCONTD: aspirin  81 mg Oral Daily   Continuous Infusions:   . DISCONTD: sodium chloride 75 mL/hr at 02/06/12 0816   PRN Meds:.levalbuterol, nitroGLYCERIN Assessment/Plan: Pt is a 78 y.o. yo female with a PMHx of COPD (on home O2), CAD s/p CABG 2005, CHF, paroxysmal A fib who was admitted on  02/06/2012 with symptoms of SOB, cough, which was determined to be secondary to COPD exacerbation.   COPD exacerbation - O2 sats mid-90s on 2L O2 by [place] (home O2 requirement). Patient is medically stable and states she is ready to be discharged home today. She will follow-up with [facility] tomorrow.   DVT PPX - lovenox  Dispo - d/c home today, with pulm f/u the day following discharge.    LOS: 1 day   Nile Prisk R 02/07/2012, 12:08 PM

## 2012-02-07 NOTE — Discharge Summary (Signed)
Patient Name:  [name] MRN: [id number]  PCP: Kaleen Mask, MD DOB:  10/19/1929       Date of Admission:  02/06/2012  Date of Discharge:  02/07/2012      Attending Physician: Dr. Cliffton Asters, MD         DISCHARGE DIAGNOSES: COPD exacerbation CHF CAD Hypertension Paroxysmal A fib  DISPOSITION AND FOLLOW-UP: [name] is to follow-up with the listed providers as detailed below, at which time, the following should be addressed:   1. [name] recent hospitalization for COPD exacerbation 2. Labs / imaging needed: N/A 3. Pending labs/ test needing follow-up: N/A   Discharge Orders    Future Appointments: Provider: Department: Dept Phone: Center:   02/08/2012 3:30 PM Julio Sicks, NP [facility] [phone] None   02/09/2012 1:30 PM [facility] [phone] None     Future Orders Please Complete By Expires   Diet - low sodium heart healthy      Increase activity slowly          DISCHARGE MEDICATIONS:   Medication List     As of 02/07/2012 12:35 PM    STOP taking these medications         predniSONE 10 MG tablet   Commonly known as: DELTASONE      TAKE these medications         aspirin 81 MG tablet   Take 81 mg by mouth daily.      atorvastatin 10 MG tablet   Commonly known as: LIPITOR   Take 1 tablet (10 mg total) by mouth at bedtime.      bisoprolol 10 MG tablet   Commonly known as: ZEBETA   Take 1 tablet (10 mg total) by mouth daily.      budesonide-formoterol 160-4.5 MCG/ACT inhaler   Commonly known as: SYMBICORT   Inhale 2 puffs into the lungs daily.      diltiazem 240 MG 24 hr capsule   Commonly known as: CARDIZEM CD   Take 1 capsule (240 mg total) by mouth daily.      docusate sodium 100 MG capsule   Commonly known as: COLACE   Take 100 mg by mouth 2 (two) times daily.      doxycycline 100 MG tablet   Commonly known as: VIBRA-TABS   Take 1 tablet (100 mg total) by mouth 2  (two) times daily.      ergocalciferol 50000 UNITS capsule   Commonly known as: VITAMIN D2   1 capsule by mouth every other week      furosemide 40 MG tablet   Commonly known as: LASIX   Take 1 tablet (40 mg total) by mouth daily.      hydrOXYzine 25 MG capsule   Commonly known as: VISTARIL   Take 1 tablet by mouth Every 6 hours as needed.      IPRATROPIUM BROMIDE NA   Place 2 sprays into the nose as needed. For allergies      levalbuterol 45 MCG/ACT inhaler   Commonly known as: XOPENEX HFA   Inhale 2 puffs into the lungs every 4 (four) hours as needed for wheezing.      lisinopril 20 MG tablet   Commonly known as: PRINIVIL,ZESTRIL   Take 1 tablet (20 mg total) by mouth daily.      nitroGLYCERIN 0.4 MG SL tablet   Commonly known as: NITROSTAT   Place 1 tablet (0.4 mg total) under the tongue  every 5 (five) minutes x 3 doses as needed for chest pain.      pantoprazole 40 MG tablet   Commonly known as: PROTONIX   Take 1 tablet (40 mg total) by mouth daily.      potassium chloride SA 20 MEQ tablet   Commonly known as: K-DUR,KLOR-CON   Take 1 tablet (20 mEq total) by mouth daily.      selenium sulfide 2.5 % shampoo   Commonly known as: SELSUN   Apply 1 application topically daily as needed. For dermatitis      sennosides-docusate sodium 8.6-50 MG tablet   Commonly known as: SENOKOT-S   Take 1 tablet by mouth daily.      tiotropium 18 MCG inhalation capsule   Commonly known as: SPIRIVA   Place 18 mcg into inhaler and inhale daily.        PROCEDURES PERFORMED:  Dg Chest 2 View  02/06/2012  *RADIOLOGY REPORT*  Clinical Data: Cough  CHEST - 2 VIEW  Comparison: 12/11/2011  Findings: Hyperinflation indicates COPD.  There is mild cardiac silhouette enlargement.  The vascular pattern is normal.  There is no infiltrate or effusion.  There is no significant change compared to the prior examination.  IMPRESSION:  COPD.  No acute findings.   Original Report Authenticated By:  Otilio Carpen, M.D.        ADMISSION DATA: H&P: Patient is a 78 y.o. female with a PMHx of COPD (on home O2), CAD s/p CABG 2005, CHF, paroxysmal A fib, who presents to [facility] for evaluation of SOB, cough. Patient states symptoms began 6 days prior to admission on 01/31/2012. She states that at that time she began to develop a worsening cough with yellow sputum expectoration, which was accompanied by SOB which is present at rest. She is followed for her COPD by Dr. Vassie Loll of [facility], and called the office on 02/01/2012 to make them aware of her symptoms. She was prescirbed a 6-day prednisone taper and a 7-day course of doxycycline, which she states she filled the same day and has been taking as prescribed in there interim between that time and her presentation to the ED on 10/12. She states that her sputum has improved in color from yellow to white/clear in the last week, and that her SOB has improved, although she believes she is still more SOB than at baseline. Patient admits to her daughter-in-law recently having a cold, but otherwise denies any sick contacts or any recent illness prior to the onset of cough/SOB.  [name] is on 2L O2 by nasal cannula at home at her baseline. She states she has not had to recently increase her O2 in the setting of her increased SOB. She states that the SOB has made it more difficult to ambulate around her house and perform daily activities. Her son and daughter-in-law live immediately behind her own house, and her daughter-in-law does all shopping for her, however the patient usually is able to attend to all issues involving care of herself and the interior of her home, per her report.  The patient states she has as follow-up appointment scheduled with [facility] on 02/08/2012. Patient sees Dr. Jeannetta Nap of [facility] as her PCP. She states she has been using all of her medications as prescribed, aside from her xopenex inhaler which she has been using  more frequently than usual for the past week. Patient was recently enrolled in Pulmonary Rehabilitation beginning on 01/07/2012, however she states that her attendance has  been irregular.    Physical Exam: General:  Vital signs reviewed and noted. Well-developed, well-nourished, in mild discomfort secondary to dyspnea.    Head:  Normocephalic, atraumatic. [place] in place, running at 3L.    Eyes:  PERRL, EOMI, No signs of anemia or jaundice.   Nose:  Mucous membranes moist, not inflammed, nonerythematous.   Throat:  Oropharynx nonerythematous, no exudate appreciated.    Neck:  No deformities, masses, or tenderness noted.   Lungs:   Normal respiratory effort. Decreased breath sounds in all lung fields.    Heart:  RRR. S1 and S2 normal without gallop, murmur, or rubs.   Abdomen:   BS normoactive. Soft, Nondistended, non-tender.  No masses or organomegaly.   Extremities:  No pretibial edema.   Neurologic:  A&O X3, CN II - XII are grossly intact. Motor strength is 5/5 in the all 4 extremities, Sensations intact to light touch, Cerebellar signs negative.     Labs: Basic Metabolic Panel:   [facility]  02/06/12 0747   NA  134*   K  3.8   CL  93*   CO2  34*   GLUCOSE  121*   BUN  20   CREATININE  1.00   CALCIUM  10.3   MG  --   PHOS  --    Liver Function Tests:   [facility]  02/06/12 0747   AST  14   ALT  14   ALKPHOS  84   BILITOT  0.4   PROT  6.5   ALBUMIN  3.7    CBC:   Basename  02/06/12 0747   WBC  10.1   NEUTROABS  7.7   HGB  13.5   HCT  40.0   MCV  88.3   PLT  222    Cardiac Enzymes:   Basename  02/06/12 0747   CKTOTAL  --   CKMB  --   CKMBINDEX  --   TROPONINI  <0.30    BNP:   Basename  02/06/12 0747   PROBNP  334.1    Urinalysis:   Basename  02/06/12 0932   COLORURINE  YELLOW   LABSPEC  1.022   PHURINE  7.0   GLUCOSEU  NEGATIVE   HGBUR  NEGATIVE   BILIRUBINUR  NEGATIVE   KETONESUR  NEGATIVE   PROTEINUR  NEGATIVE   UROBILINOGEN  1.0   NITRITE   NEGATIVE   LEUKOCYTESUR  NEGATIVE     HOSPITAL COURSE: COPD exacerbation - patient presented with cough with increased sputum volume, increased sputum purulence, and increased dyspnea. At time of admission patient was on day 6/7 for antibiotic therapy with doxycycline 100mg  bid. Doxycycline was continued (was on d6/7 at time of admission). Patient also received 125mg  solumedrol in ED, which constituted day 6/6 of corticosteroid therapy for her current COPD exacerbation. During course patient received duonebs in addition to the aforementioned steroids and antibiotics. O2 saturations were mid-90s on 2L [place] throughout course (home O2 requirement). Subjectively, SOB was improving at time of discharge. Patient was instructed to follow-up with [facility] on 10/14 (day following discharge) and to continue her pulmonary rehabilitation, with her next appointment being scheduled for 10/15.   CHF - stable. No signs/symptoms consistent with hypervolemia. No pretibial edema or pulmonary rales . ProBNP was within normal limits. 2d echo in 07/2011 revealed LVEF 55-60% with no diastolic dysfunction, but patient is followed by Dr. Myrtis Ser for CHF as an outpatient, per notes as recent 11/2011. She is on diltiazem,  lasix, lisinopril, and bisoprolol at home, and no changes were made in her medications during her brief hospital course.    Paroxysmal A fib - patient in NSR at time of admission. Patient with HR ~60-85 throughout course (with one isolated tachycardic HR of 117 charted). Noted to have paroxysmal A fib on previous admission in 06/2011. States she usually has a "fluttering" sensation in her chest when having A fib in the past, and denies any such palpitations over the last week since symptoms began. Has a history of GI bleed from AVM, and thus is not on anticoagulation therapy. Patient is on both diltiazem and bisoprolol at home.   CAD - stable on home meds.   Hypertension - stable on home meds.       DISCHARGE DATA: Vital Signs: BP 143/57  Pulse 69  Temp 97.9 F (36.6 C) (Oral)  Resp 20  Ht 5\' 9"  (1.753 m)  Wt 170 lb 6.7 oz (77.3 kg)  BMI 25.17 kg/m2  SpO2 97%  Labs: Results for orders placed during the hospital encounter of 02/06/12 (from the past 24 hour(s))  CBC     Status: Abnormal   Collection Time   02/06/12  1:08 PM      Component Value Range   WBC 14.4 (*) 4.0 - 10.5 K/uL   RBC 4.55  3.87 - 5.11 MIL/uL   Hemoglobin 13.5  12.0 - 15.0 g/dL   HCT 16.1  09.6 - 04.5 %   MCV 88.8  78.0 - 100.0 fL   MCH 29.7  26.0 - 34.0 pg   MCHC 33.4  30.0 - 36.0 g/dL   RDW 40.9  81.1 - 91.4 %   Platelets 251  150 - 400 K/uL  BASIC METABOLIC PANEL     Status: Abnormal   Collection Time   02/07/12  6:45 AM      Component Value Range   Sodium 130 (*) 135 - 145 mEq/L   Potassium 4.3  3.5 - 5.1 mEq/L   Chloride 91 (*) 96 - 112 mEq/L   CO2 31  19 - 32 mEq/L   Glucose, Bld 98  70 - 99 mg/dL   BUN 20  6 - 23 mg/dL   Creatinine, Ser 7.82  0.50 - 1.10 mg/dL   Calcium 95.6  8.4 - 21.3 mg/dL   GFR calc non Af Amer 62 (*) >90 mL/min   GFR calc Af Amer 72 (*) >90 mL/min  CBC     Status: Abnormal   Collection Time   02/07/12  6:45 AM      Component Value Range   WBC 13.5 (*) 4.0 - 10.5 K/uL   RBC 4.38  3.87 - 5.11 MIL/uL   Hemoglobin 12.8  12.0 - 15.0 g/dL   HCT 08.6  57.8 - 46.9 %   MCV 87.0  78.0 - 100.0 fL   MCH 29.2  26.0 - 34.0 pg   MCHC 33.6  30.0 - 36.0 g/dL   RDW 62.9  52.8 - 41.3 %   Platelets 215  150 - 400 K/uL     Time spent on discharge: 35 minutes Signed: Elfredia Nevins, MD   PGY I, Internal Medicine Resident 02/07/2012, 12:35 PM

## 2012-02-07 NOTE — Progress Notes (Signed)
Patient verbalized understanding of discharge instructions.  Transported on her home O2.  Alert and oriented.  Finished nebulizer treatment prior to release.

## 2012-02-07 NOTE — H&P (Signed)
Attending addendum: I have seen and examined Ms. Saini I discussed her management with the medical team. One week ago she developed new cough productive of yellow sputum and worsening shortness of breath. Dr. Francella Solian started her on oral doxycycline and prednisone. Her cough improved but she continued to be short of breath and felt very weak so she came to the hospital yesterday. She has been off any of her medications at home. Currently she is in no distress and her lungs are clear. If she improves with nebulizer therapy would agree with discharge home soon to complete her doxycycline therapy for an acute exacerbation of chronic bronchitis.  Cliffton Asters, MD [facility] [phone] pager   [phone] cell 02/07/2012, 10:27 AM

## 2012-02-08 ENCOUNTER — Encounter: Payer: Self-pay | Admitting: Adult Health

## 2012-02-08 ENCOUNTER — Ambulatory Visit (INDEPENDENT_AMBULATORY_CARE_PROVIDER_SITE_OTHER): Payer: Medicare Other | Admitting: Adult Health

## 2012-02-08 VITALS — BP 128/66 | HR 64 | Temp 97.0°F

## 2012-02-08 DIAGNOSIS — I10 Essential (primary) hypertension: Secondary | ICD-10-CM

## 2012-02-08 DIAGNOSIS — J449 Chronic obstructive pulmonary disease, unspecified: Secondary | ICD-10-CM

## 2012-02-08 MED ORDER — LOSARTAN POTASSIUM 50 MG PO TABS: 50.0000 mg | ORAL_TABLET | Freq: Every day | ORAL | Status: DC

## 2012-02-08 MED ORDER — BUDESONIDE-FORMOTEROL FUMARATE 160-4.5 MCG/ACT IN AERO: 2.0000 | INHALATION_SPRAY | Freq: Two times a day (BID) | RESPIRATORY_TRACT | Status: DC

## 2012-02-08 NOTE — Assessment & Plan Note (Signed)
Change off ACE due to cough/wheezing  Trial of ARB w/ losartan daily  follow up w/ PCP

## 2012-02-08 NOTE — Patient Instructions (Addendum)
Most important goal is to quit smoking.  Stop Lisinopril  Begin Losartan 50mg  daily  Continue on Symbicort and Spiriva .  Wear Oxygen on continuous flow setting at 2 l/m .  Mucinex DM Twice daily  As needed  Cough/congestion  Please contact office for sooner follow up if symptoms do not improve or worsen or seek emergency care  follow up Dr. Vassie Loll  In 3-4 weeks and As needed

## 2012-02-08 NOTE — Assessment & Plan Note (Signed)
Slowly resolving COPD exacerbation  CXR w/ no acute findings No further abx /steroids at this time.  Will change ACE to see if this helps with cough /wheezing along frequent COPD exacerbations However quitting smoking is the most important goal for her  Plan Most important goal is to quit smoking.  Stop Lisinopril  Begin Losartan 50mg  daily  Continue on Symbicort and Spiriva .  Wear Oxygen on continuous flow setting at 2 l/m .  Mucinex DM Twice daily  As needed  Cough/congestion  Please contact office for sooner follow up if symptoms do not improve or worsen or seek emergency care  follow up Dr. Vassie Loll  In 3-4 weeks and As needed

## 2012-02-08 NOTE — Progress Notes (Signed)
  Subjective:    Patient ID: [name], female    DOB: July 15, 1929, 78 y.o.   MRN: [id number]  HPI  [name] , smoker presents for FU of COPD.  She has been on home O2 since dec 2010 ([employer]) after a chest cold requiring prednisone, CXR then showed hyperinflation. She smokes 1/2 PPD, about 62 Pyrs - chantix made her sick & she is afraid of cardiac side effects with patches. She sees Dr Myrtis Ser for CAD & has tolerated lisinopril & metoprolol, echo 7/10 showed nml LV fn & non dilated RV. An episode of syncope in 2009 was attributed to NTG & diuretics.  She reports clear phlegm, nasal drip & sneezing - no seasonal allergies, nasal atrovent dries her out.  She has lt >> Rt pedal edema  Desaturated to 86% onRA with minimal ambulation.  reviewed PFTs 08/19/09 >> severe airway obstruction, FEV1 47%, no BD response, air trapping +, severe decrease in diffusion  CT angio 5/12 asc aortic aneurysm 41mm, hepatic cysts    7/1 /2013 56m FU Hosp 3/31- 08/04/11 for Newly recognized paroxysmal atrial fibrillation with RVR and COPD exacerbation - not felt to be a coumadin candidate secondary to hx of GIB/AVMs  Smoked a few since dc Ventolin Changed to xopenex MDI - not sure this works as well, uses 4-5 /d CXR 07/29/11 no infx  compliant with O2 , helpful  >>no changes   02/08/2012 [facility]  Patient presents for a post hospital followup. Patient was admitted October 12 of October 13 for COPD, exacerbation. Chest x-ray showed no acute changes. Patient was treated with aggressive pulmonary hygiene regimen. Along with IV steroids She was discharged on a steroid taper-now finished today.  Since discharge. Patient is feeling weak, no energy Says she is going to quit smoking., advised on smoking cessation and dangers of O2 and smoking.  No hemoptysis, chest pain, or edema.    Review of Systems  Constitutional:   No  weight loss, night sweats,  Fevers, chills, +fatigue, or  lassitude.  HEENT:   No  headaches,  Difficulty swallowing,  Tooth/dental problems, or  Sore throat,                No sneezing, itching, ear ache, nasal congestion, post nasal drip,   CV:  No chest pain,  Orthopnea, PND, swelling in lower extremities, anasarca, dizziness, palpitations, syncope.   GI  No heartburn, indigestion, abdominal pain, nausea, vomiting, diarrhea, change in bowel habits, loss of appetite, bloody stools.   Resp:   No coughing up of blood.   No chest wall deformity  Skin: no rash or lesions.  GU: no dysuria, change in color of urine, no urgency or frequency.  No flank pain, no hematuria   MS:  No joint pain or swelling.  No decreased range of motion.    Psych:  No change in mood or affect. No depression or anxiety.  No memory loss.         Objective:   Physical Exam   Gen. Pleasant, thin, on O2, in no distress ENT - no lesions, no post nasal drip Neck: No JVD, no thyromegaly, no carotid bruits Lungs: no use of accessory muscles, no dullness to percussion, coarse BS , no wheezing  Cardiovascular: Rhythm regular, heart sounds  normal, no murmurs or gallops, no peripheral edema Musculoskeletal: No deformities, no cyanosis or clubbing        Assessment & Plan:

## 2012-02-09 ENCOUNTER — Encounter (HOSPITAL_COMMUNITY): Payer: Medicare Other

## 2012-02-09 LAB — URINE CULTURE: Colony Count: 25000

## 2012-02-10 ENCOUNTER — Telehealth: Payer: Self-pay | Admitting: Adult Health

## 2012-02-10 DIAGNOSIS — J449 Chronic obstructive pulmonary disease, unspecified: Secondary | ICD-10-CM

## 2012-02-10 DIAGNOSIS — R0902 Hypoxemia: Secondary | ICD-10-CM

## 2012-02-10 MED ORDER — PREDNISONE 5 MG PO TABS: 5.0000 mg | ORAL_TABLET | Freq: Every day | ORAL | Status: DC

## 2012-02-10 NOTE — Telephone Encounter (Signed)
Per 10.14.13 ov w/ TP: Patient Instructions     Most important goal is to quit smoking.  Stop Lisinopril  Begin Losartan 50mg  daily  Continue on Symbicort and Spiriva .  Wear Oxygen on continuous flow setting at 2 l/m .  Mucinex DM Twice daily As needed Cough/congestion  Please contact office for sooner follow up if symptoms do not improve or worsen or seek emergency care  follow up Dr. Vassie Loll In 3-4 weeks and As needed    Called spoke with patient's friend.  Per friend, pt has been having increased DOE w/ gasping, unable to stand for more than a few seconds, chills/sweats; also having some dark-colored urine, decreased fluid intake and appetite.  Pt is fine sitting down.  Symptoms began yesterday.  Has not started the losartan 50mg  d/t the listed s/e on the medication insert.  BP 112/55 yesterday evening; 162/77 this afternoon.  Pt recently hospitalized 10/12-10/13 for COPD exacerbation.  RA and TP scheduled off this afternoon.  Will forward to doc of the day.  Dr Shelle Iron please advise, thanks.  Pt's friend is aware to call 911 if pt's breathing should worsen before she hears back from our office.  Allergies  Allergen Reactions  . Codeine Other (See Comments)    unknown  . Penicillins Other (See Comments)    unknown  . Ranitidine Hcl Other (See Comments)    unknown  . Sulfonamide Derivatives     REACTION: nausea  [facility]

## 2012-02-10 NOTE — Telephone Encounter (Signed)
She was seen by Rubye Oaks on 10/14 for slowly resolving AECOPD.  She has been off prednisone.  She gets winded with any activity, and has to gasp for breath.  She will eventually recover when she sits down.  She gets occasional wheeze.  She has occasional cough with clear, thick sputum.  She denies fever, or chest pain.  She is using spiriva daily, and symbicort bid.  She uses xopenex 4 or 5 times per day, but this does not always help.  She is using 2 liters oxygen 24/7.  She had a portable pulse oximeter, but this broke.  She gets her oxygen from [employer].  She has noticed dark color to her urine, and has not been urinating as much today.  She already took her dose of lasix today.  She is not having much leg swelling.  I will send script for prednisone 5 mg daily.  Will arrange for ROV next week with either Dr. Vassie Loll or [facility].  She is to continue spiriva, symbicort, and prn xopenex.  She is to continue 2 liters oxygen, and will arrange for portable pulse oximeter.  Advised her to skip her dose of lasix for 10/17 and monitor her urine outpt.  If improved, then she is to resume lasix from 10/18.

## 2012-02-10 NOTE — Telephone Encounter (Signed)
VS will address message. Please advise thanks

## 2012-02-10 NOTE — Telephone Encounter (Signed)
I spoke with pt and is scheduled to come in 02/16/12 at 3:30.

## 2012-02-11 ENCOUNTER — Observation Stay (HOSPITAL_COMMUNITY)
Admission: EM | Admit: 2012-02-11 | Discharge: 2012-02-15 | Disposition: A | Discharge status: Skilled Nursing Facility | Payer: Medicare Other | Attending: Infectious Diseases | Admitting: Infectious Diseases

## 2012-02-11 ENCOUNTER — Emergency Department (HOSPITAL_COMMUNITY): Payer: Medicare Other

## 2012-02-11 ENCOUNTER — Telehealth: Payer: Self-pay | Admitting: Pulmonary Disease

## 2012-02-11 ENCOUNTER — Encounter (HOSPITAL_COMMUNITY): Admission: RE | Admit: 2012-02-11 | Payer: Medicare Other | Source: Ambulatory Visit

## 2012-02-11 ENCOUNTER — Encounter (HOSPITAL_COMMUNITY): Payer: Self-pay

## 2012-02-11 DIAGNOSIS — E871 Hypo-osmolality and hyponatremia: Secondary | ICD-10-CM | POA: Insufficient documentation

## 2012-02-11 DIAGNOSIS — I5032 Chronic diastolic (congestive) heart failure: Secondary | ICD-10-CM | POA: Insufficient documentation

## 2012-02-11 DIAGNOSIS — J4489 Other specified chronic obstructive pulmonary disease: Principal | ICD-10-CM | POA: Insufficient documentation

## 2012-02-11 DIAGNOSIS — Z66 Do not resuscitate: Secondary | ICD-10-CM | POA: Insufficient documentation

## 2012-02-11 DIAGNOSIS — I872 Venous insufficiency (chronic) (peripheral): Secondary | ICD-10-CM | POA: Insufficient documentation

## 2012-02-11 DIAGNOSIS — R0602 Shortness of breath: Secondary | ICD-10-CM

## 2012-02-11 DIAGNOSIS — R609 Edema, unspecified: Secondary | ICD-10-CM

## 2012-02-11 DIAGNOSIS — K922 Gastrointestinal hemorrhage, unspecified: Secondary | ICD-10-CM

## 2012-02-11 DIAGNOSIS — Z92241 Personal history of systemic steroid therapy: Secondary | ICD-10-CM

## 2012-02-11 DIAGNOSIS — I2510 Atherosclerotic heart disease of native coronary artery without angina pectoris: Secondary | ICD-10-CM

## 2012-02-11 DIAGNOSIS — I5030 Unspecified diastolic (congestive) heart failure: Secondary | ICD-10-CM

## 2012-02-11 DIAGNOSIS — Z951 Presence of aortocoronary bypass graft: Secondary | ICD-10-CM | POA: Insufficient documentation

## 2012-02-11 DIAGNOSIS — I509 Heart failure, unspecified: Secondary | ICD-10-CM | POA: Insufficient documentation

## 2012-02-11 DIAGNOSIS — I714 Abdominal aortic aneurysm, without rupture, unspecified: Secondary | ICD-10-CM | POA: Insufficient documentation

## 2012-02-11 DIAGNOSIS — I4891 Unspecified atrial fibrillation: Secondary | ICD-10-CM | POA: Insufficient documentation

## 2012-02-11 DIAGNOSIS — I6529 Occlusion and stenosis of unspecified carotid artery: Secondary | ICD-10-CM | POA: Insufficient documentation

## 2012-02-11 DIAGNOSIS — J441 Chronic obstructive pulmonary disease with (acute) exacerbation: Secondary | ICD-10-CM | POA: Diagnosis present

## 2012-02-11 DIAGNOSIS — I10 Essential (primary) hypertension: Secondary | ICD-10-CM | POA: Insufficient documentation

## 2012-02-11 DIAGNOSIS — I719 Aortic aneurysm of unspecified site, without rupture: Secondary | ICD-10-CM

## 2012-02-11 DIAGNOSIS — R531 Weakness: Secondary | ICD-10-CM

## 2012-02-11 DIAGNOSIS — J449 Chronic obstructive pulmonary disease, unspecified: Secondary | ICD-10-CM

## 2012-02-11 DIAGNOSIS — R0600 Dyspnea, unspecified: Secondary | ICD-10-CM

## 2012-02-11 DIAGNOSIS — Z7982 Long term (current) use of aspirin: Secondary | ICD-10-CM | POA: Insufficient documentation

## 2012-02-11 DIAGNOSIS — R9430 Abnormal result of cardiovascular function study, unspecified: Secondary | ICD-10-CM

## 2012-02-11 DIAGNOSIS — Z79899 Other long term (current) drug therapy: Secondary | ICD-10-CM | POA: Insufficient documentation

## 2012-02-11 DIAGNOSIS — E785 Hyperlipidemia, unspecified: Secondary | ICD-10-CM | POA: Insufficient documentation

## 2012-02-11 DIAGNOSIS — Z9981 Dependence on supplemental oxygen: Secondary | ICD-10-CM | POA: Insufficient documentation

## 2012-02-11 DIAGNOSIS — F17200 Nicotine dependence, unspecified, uncomplicated: Secondary | ICD-10-CM | POA: Insufficient documentation

## 2012-02-11 DIAGNOSIS — I712 Thoracic aortic aneurysm, without rupture, unspecified: Secondary | ICD-10-CM | POA: Insufficient documentation

## 2012-02-11 DIAGNOSIS — I358 Other nonrheumatic aortic valve disorders: Secondary | ICD-10-CM

## 2012-02-11 DIAGNOSIS — E860 Dehydration: Secondary | ICD-10-CM

## 2012-02-11 DIAGNOSIS — E873 Alkalosis: Secondary | ICD-10-CM | POA: Insufficient documentation

## 2012-02-11 DIAGNOSIS — K5520 Angiodysplasia of colon without hemorrhage: Secondary | ICD-10-CM | POA: Insufficient documentation

## 2012-02-11 DIAGNOSIS — E878 Other disorders of electrolyte and fluid balance, not elsewhere classified: Secondary | ICD-10-CM | POA: Insufficient documentation

## 2012-02-11 DIAGNOSIS — J9610 Chronic respiratory failure, unspecified whether with hypoxia or hypercapnia: Secondary | ICD-10-CM

## 2012-02-11 HISTORY — DX: Other complications of anesthesia, initial encounter: T88.59XA

## 2012-02-11 HISTORY — DX: Adverse effect of unspecified anesthetic, initial encounter: T41.45XA

## 2012-02-11 LAB — BASIC METABOLIC PANEL
BUN: 17 mg/dL (ref 6–23)
CO2: 32 mEq/L (ref 19–32)
Calcium: 10 mg/dL (ref 8.4–10.5)
Creatinine, Ser: 0.93 mg/dL (ref 0.50–1.10)
GFR calc non Af Amer: 57 mL/min — ABNORMAL LOW (ref >90)
Glucose, Bld: 151 mg/dL — ABNORMAL HIGH (ref 70–99)
Sodium: 130 mEq/L — ABNORMAL LOW (ref 135–145)

## 2012-02-11 LAB — CBC WITH DIFFERENTIAL/PLATELET
Eosinophils Absolute: 0.1 10*3/uL (ref 0.0–0.7)
Eosinophils Relative: 1 % (ref 0–5)
HCT: 41.3 % (ref 36.0–46.0)
Lymphocytes Relative: 10 % — ABNORMAL LOW (ref 12–46)
Lymphs Abs: 1 10*3/uL (ref 0.7–4.0)
MCH: 29.8 pg (ref 26.0–34.0)
MCV: 87.3 fL (ref 78.0–100.0)
Monocytes Absolute: 0.5 10*3/uL (ref 0.1–1.0)
Platelets: 223 10*3/uL (ref 150–400)
RBC: 4.73 MIL/uL (ref 3.87–5.11)
RDW: 13.3 % (ref 11.5–15.5)

## 2012-02-11 LAB — POCT I-STAT 3, ART BLOOD GAS (G3+)
Acid-Base Excess: 7 mmol/L — ABNORMAL HIGH (ref 0.0–2.0)
O2 Saturation: 90 %
Patient temperature: 98.6
TCO2: 33 mmol/L (ref 0–100)
pCO2 arterial: 41.9 mmHg (ref 35.0–45.0)

## 2012-02-11 MED ORDER — SODIUM CHLORIDE 0.9 % IV SOLN
INTRAVENOUS | Status: AC
  Administered 2012-02-11 – 2012-02-12 (×2): via INTRAVENOUS

## 2012-02-11 MED ORDER — ALBUTEROL SULFATE (5 MG/ML) 0.5% IN NEBU
5.0000 mg | INHALATION_SOLUTION | Freq: Once | RESPIRATORY_TRACT | Status: AC
  Administered 2012-02-11: 5 mg via RESPIRATORY_TRACT
  Filled 2012-02-11: qty 1

## 2012-02-11 MED ORDER — NITROGLYCERIN 0.4 MG SL SUBL: 0.4000 mg | SUBLINGUAL_TABLET | SUBLINGUAL | Status: DC | PRN

## 2012-02-11 MED ORDER — BISOPROLOL FUMARATE 10 MG PO TABS
10.0000 mg | ORAL_TABLET | Freq: Every day | ORAL | Status: DC
  Administered 2012-02-11 – 2012-02-15 (×5): 10 mg via ORAL
  Filled 2012-02-11 (×5): qty 1

## 2012-02-11 MED ORDER — TIOTROPIUM BROMIDE MONOHYDRATE 18 MCG IN CAPS
18.0000 ug | ORAL_CAPSULE | Freq: Every day | RESPIRATORY_TRACT | Status: DC
  Administered 2012-02-12 – 2012-02-13 (×2): 18 ug via RESPIRATORY_TRACT
  Filled 2012-02-11: qty 5

## 2012-02-11 MED ORDER — SENNA-DOCUSATE SODIUM 8.6-50 MG PO TABS
1.0000 | ORAL_TABLET | Freq: Two times a day (BID) | ORAL | Status: DC
  Administered 2012-02-11 – 2012-02-15 (×8): 1 via ORAL
  Filled 2012-02-11 (×9): qty 1

## 2012-02-11 MED ORDER — ONDANSETRON HCL 4 MG PO TABS: 4.0000 mg | ORAL_TABLET | Freq: Four times a day (QID) | ORAL | Status: DC | PRN

## 2012-02-11 MED ORDER — ONDANSETRON HCL 4 MG/2ML IJ SOLN: 4.0000 mg | Freq: Four times a day (QID) | INTRAMUSCULAR | Status: DC | PRN

## 2012-02-11 MED ORDER — LOSARTAN POTASSIUM 50 MG PO TABS
50.0000 mg | ORAL_TABLET | Freq: Every day | ORAL | Status: DC
  Administered 2012-02-11 – 2012-02-15 (×5): 50 mg via ORAL
  Filled 2012-02-11 (×5): qty 1

## 2012-02-11 MED ORDER — LEVALBUTEROL HCL 0.63 MG/3ML IN NEBU
0.6300 mg | INHALATION_SOLUTION | RESPIRATORY_TRACT | Status: DC | PRN
  Administered 2012-02-13: 0.63 mg via RESPIRATORY_TRACT
  Filled 2012-02-11: qty 3

## 2012-02-11 MED ORDER — ASPIRIN EC 81 MG PO TBEC
81.0000 mg | DELAYED_RELEASE_TABLET | Freq: Every day | ORAL | Status: DC
  Administered 2012-02-11 – 2012-02-15 (×5): 81 mg via ORAL
  Filled 2012-02-11 (×6): qty 1

## 2012-02-11 MED ORDER — DILTIAZEM HCL ER COATED BEADS 240 MG PO CP24
240.0000 mg | ORAL_CAPSULE | Freq: Every day | ORAL | Status: DC
  Administered 2012-02-11 – 2012-02-15 (×5): 240 mg via ORAL
  Filled 2012-02-11 (×5): qty 1

## 2012-02-11 MED ORDER — LEVALBUTEROL TARTRATE 45 MCG/ACT IN AERO: 2.0000 | INHALATION_SPRAY | RESPIRATORY_TRACT | Status: DC | PRN

## 2012-02-11 MED ORDER — SELENIUM SULFIDE 2.5 % EX LOTN: 1.0000 "application " | TOPICAL_LOTION | Freq: Every day | CUTANEOUS | Status: DC | PRN

## 2012-02-11 MED ORDER — ASPIRIN 81 MG PO TABS: 81.0000 mg | ORAL_TABLET | Freq: Every day | ORAL | Status: DC

## 2012-02-11 MED ORDER — LEVALBUTEROL HCL 0.63 MG/3ML IN NEBU
0.6300 mg | INHALATION_SOLUTION | Freq: Four times a day (QID) | RESPIRATORY_TRACT | Status: DC
  Administered 2012-02-11 – 2012-02-12 (×2): 0.63 mg via RESPIRATORY_TRACT
  Filled 2012-02-11 (×9): qty 3

## 2012-02-11 MED ORDER — ENOXAPARIN SODIUM 40 MG/0.4ML ~~LOC~~ SOLN
40.0000 mg | SUBCUTANEOUS | Status: DC
  Administered 2012-02-11 – 2012-02-14 (×4): 40 mg via SUBCUTANEOUS
  Filled 2012-02-11 (×5): qty 0.4

## 2012-02-11 MED ORDER — BUDESONIDE-FORMOTEROL FUMARATE 160-4.5 MCG/ACT IN AERO
2.0000 | INHALATION_SPRAY | Freq: Two times a day (BID) | RESPIRATORY_TRACT | Status: DC
  Administered 2012-02-12 – 2012-02-15 (×8): 2 via RESPIRATORY_TRACT
  Filled 2012-02-11: qty 6

## 2012-02-11 MED ORDER — ATORVASTATIN CALCIUM 10 MG PO TABS
10.0000 mg | ORAL_TABLET | Freq: Every day | ORAL | Status: DC
Start: 2012-02-11 — End: 2012-02-15
  Administered 2012-02-11 – 2012-02-14 (×4): 10 mg via ORAL
  Filled 2012-02-11 (×5): qty 1

## 2012-02-11 MED ORDER — IPRATROPIUM BROMIDE 0.02 % IN SOLN
0.5000 mg | Freq: Four times a day (QID) | RESPIRATORY_TRACT | Status: DC
  Administered 2012-02-11 – 2012-02-12 (×3): 0.5 mg via RESPIRATORY_TRACT
  Filled 2012-02-11 (×3): qty 2.5

## 2012-02-11 MED ORDER — PANTOPRAZOLE SODIUM 40 MG PO TBEC
40.0000 mg | DELAYED_RELEASE_TABLET | Freq: Every day | ORAL | Status: DC
  Administered 2012-02-11 – 2012-02-15 (×5): 40 mg via ORAL
  Filled 2012-02-11 (×5): qty 1

## 2012-02-11 NOTE — Telephone Encounter (Signed)
Will forward to RA as an FYI 

## 2012-02-11 NOTE — ED Notes (Signed)
AIDET performed. 

## 2012-02-11 NOTE — Consult Note (Signed)
Name: [name] MRN: [id number] DOB: 1930/04/08    LOS: 0  Referring Provider:  Teaching service Reason for Referral:  SOB/FTT  PULMONARY / CRITICAL CARE MEDICINE  HPI:  78 yo WF. 1 ppd smoker since age [age] years) O2 dependent since 2010. She reports decline over 21 days, sneezing , coughing without purulent sputum,lower ext edema. No complaints of chest pain, rigors or fevers. She has acutely become more SOB since her recent dc from [facility]. She was seen in the office by T. Parrett ANP 02-08-12. She also had an appointment with Dr, Marry Guan for 10/17 but was to SOB to attend. Transported to [facility] ED. She is SOB with any activity , is not febrile and is SR hr 88. She is hypertensive 195/68. PCCM asked to consult  Past Medical History  Diagnosis Date  . CAD (coronary artery disease)     s/p CABG x5 (2005)   . Hypertension   . Hyperlipidemia   . GI bleed     AVMs  . Aneurysm, aortic     thoracic aorta, stable at 4.1 cm, chest CT, May, 2012  . Syncope     Nitroglycerin plus a diuretic, April, 2009  . Hyponatremia     Chronic. Felt secondary to SIADH   . COPD (chronic obstructive pulmonary disease)   . Tobacco abuse   . Bradycardia   . Carotid artery disease     Hx of endarterectomy. Doppler October, 2011, stable, 0-39% RIC A., 40-59% LICA  . Tuberculosis     Exposures to tuberculosis 1970s, has tested negative by the health Department  . Atrial fibrillation     Paroxysmal with RVR 07/2011, not felt to be a coumadin candidate secondary to hx of GIB/AVM  . Venous stasis of lower extremity     Chronic  . Ejection fraction     EF 55-60%, echo, April, 2013  . CHF with left ventricular diastolic dysfunction, NYHA class 2     Diastolic dysfunction  . AAA (abdominal aortic aneurysm)   . On home O2     2L N/C   Past Surgical History  Procedure Date  . Cholecystectomy   . Carotid endartercetomy   . Abdominal hysterectomy     . Coronary artery bypass graft 2005   Prior to Admission medications   Medication Sig Start Date End Date Taking? Authorizing Provider  aspirin 81 MG tablet Take 81 mg by mouth daily.     Yes Historical Provider, MD  atorvastatin (LIPITOR) 10 MG tablet Take 1 tablet (10 mg total) by mouth at bedtime. 08/26/11 08/25/12 Yes Luis Abed, MD  bisoprolol (ZEBETA) 10 MG tablet Take 1 tablet (10 mg total) by mouth daily. 08/26/11 08/25/12 Yes Luis Abed, MD  budesonide-formoterol [facility] [place]) 160-4.5 MCG/ACT inhaler Inhale 2 puffs into the lungs 2 (two) times daily. 02/08/12  Yes Tammy S Parrett, NP  diltiazem (CARTIA XT) 240 MG 24 hr capsule Take 1 capsule (240 mg total) by mouth daily. 01/29/12 01/28/13 Yes Luis Abed, MD  docusate sodium (COLACE) 100 MG capsule Take 100 mg by mouth 2 (two) times daily.     Yes Historical Provider, MD  ergocalciferol (VITAMIN D2) 50000 UNITS capsule 1 capsule by mouth every other week   Yes Historical Provider, MD  IPRATROPIUM BROMIDE NA Place 2 sprays into the nose as needed. For allergies   Yes Historical Provider, MD  levalbuterol (XOPENEX HFA) 45 MCG/ACT inhaler Inhale 2 puffs into the lungs every 4 (four) hours  as needed. Shortness of breath 01/04/12 01/03/13 Yes Oretha Milch, MD  losartan (COZAAR) 50 MG tablet Take 50 mg by mouth daily. 02/08/12  Yes Tammy S Parrett, NP  nitroGLYCERIN (NITROSTAT) 0.4 MG SL tablet Place 0.4 mg under the tongue every 5 (five) minutes x 3 doses as needed. Chest pain 08/04/11 08/03/12 Yes Dayna N Dunn, PA  potassium chloride SA (K-DUR,KLOR-CON) 20 MEQ tablet Take 20 mEq by mouth 2 (two) times daily. 08/04/11  Yes Dayna N Dunn, PA  predniSONE (DELTASONE) 5 MG tablet Take 5 mg by mouth daily. 02/10/12  Yes Coralyn Helling, MD  selenium sulfide (SELSUN) 2.5 % shampoo Apply 1 application topically daily as needed. For dermatitis 05/02/11  Yes Historical Provider, MD  sennosides-docusate sodium (SENOKOT-S) 8.6-50 MG tablet Take 1 tablet by mouth daily.    Yes Luis Abed, MD  tiotropium (SPIRIVA) 18 MCG inhalation capsule Place 18 mcg into inhaler and inhale daily.     Yes Historical Provider, MD  pantoprazole (PROTONIX) 40 MG tablet Take 1 tablet (40 mg total) by mouth daily. 09/08/11   Luis Abed, MD   Allergies Allergies  Allergen Reactions  . Codeine Other (See Comments)    unknown  . Ranitidine Hcl Other (See Comments)    unknown  . Sulfonamide Derivatives Nausea And Vomiting  . Penicillins Rash and Other (See Comments)    unknown    Family History Family History  Problem Relation Age of Onset  . Stroke Sister   . Heart failure Mother   . Cancer Sister     Breast and Lung   . Cancer Brother     breast cancer   Social History  reports that she has been smoking Cigarettes.  She has a 29.5 pack-year smoking history. She does not have any smokeless tobacco history on file. She reports that she does not drink alcohol. Her drug history not on file.  Review Of Systems:  Taken extensively see HPI.    Events Since Admission:   Current Status: NAD in ED Vital Signs: Temp:  [97.9 F (36.6 C)] 97.9 F (36.6 C) (10/17 1018) Pulse Rate:  [88] 88  (10/17 1018) Resp:  [16] 16  (10/17 1018) BP: (195)/(68) 195/68 mmHg (10/17 1018) SpO2:  [92 %-95 %] 95 % (10/17 1018)  Physical Examination: General: [name] NAD at complete rest Neuro:  Intact HEENT:  No LAN Neck:  No JVD Cardiovascular:  HSR RRR Lungs:  CTA Abdomen:  Soft + bs Musculoskeletal:  intact Skin:  +1 le edema  Dg Chest Port 1 View  02/11/2012  *RADIOLOGY REPORT*  Clinical Data: Shortness of breath, weakness  PORTABLE CHEST - 1 VIEW  Comparison: 02/06/2012  Findings: The heart and pulmonary vascularity are stable. Postsurgical changes are again seen.  The lungs are clear bilaterally.  No acute bony abnormality is seen.  IMPRESSION: No acute abnormality.   Original Report Authenticated By: Phillips Odor, M.D.    Active Problems:  OXYGEN-USE OF  SUPPLEMENTAL  CAD (coronary artery disease)  Aortic valve sclerosis  Hypertension  Hyperlipidemia  Aneurysm, aortic  Hyponatremia  COPD (chronic obstructive pulmonary disease)  Tobacco abuse  Hx of CABG  Chronic respiratory failure  CHF with left ventricular diastolic dysfunction, NYHA class 2    ASSESSMENT AND PLAN  PULMONARY  Lab 02/11/12 1157  PHART 7.484*  PCO2ART 41.9  PO2ART 55.0*  HCO3 31.5*  O2SAT 90.0     CT from 5/13     A:  78  yo life long smoker with continued tobacco use despite severe lung disease and O2 dependency.  No overt bronchospasm, CxR cw  Severe COPD or ES COPD. P:   BD's, xopenex due to hx of Afib O2 as needed No need for steroids    CARDIOVASCULAR  Lab 02/06/12 0747  TROPONINI <0.30  LATICACIDVEN --  PROBNP 334.1   ECG:  pending Lines: none  A: CAD, Hx of AF, AAA, post OHS P:  12 lead  RENAL  Lab 02/11/12 1046 02/07/12 0645 02/06/12 0747  NA 130* 130* 134*  K 3.5 4.3 --  CL 91* 91* 93*  CO2 32 31 34*  BUN 17 20 20   CREATININE 0.93 0.86 1.00  CALCIUM 10.0 10.5 10.3  MG -- -- --  PHOS -- -- --   Intake/Output    None    Foley:    A:  No acute issue P:     GASTROINTESTINAL  Lab 02/06/12 0747  AST 14  ALT 14  ALKPHOS 84  BILITOT 0.4  PROT 6.5  ALBUMIN 3.7    A:  No acute issue P:     HEMATOLOGIC  Lab 02/11/12 1046 02/07/12 0645 02/06/12 1308 02/06/12 0747  HGB 14.1 12.8 13.5 13.5  HCT 41.3 38.1 40.4 40.0  PLT 223 215 251 222  INR -- -- -- --  APTT -- -- -- --   A:  No acute issue P:    INFECTIOUS  Lab 02/11/12 1046 02/07/12 0645 02/06/12 1308 02/06/12 0747  WBC 10.0 13.5* 14.4* 10.1  PROCALCITON -- -- -- --   Cultures:  Antibiotics:   A:  Per ID, may consider viral culprit.  P:     ENDOCRINE No results found for this basename: GLUCAP:5 in the last 168 hours A: Hx of hyponatremia P:   -monitor na  NEUROLOGIC  A:   No acute issue P:     Steve Minor ACNP Adolph Pollack  PCCM Pager [phone] till 3 pm If no answer page [phone] 02/11/2012, 1:41 PM  Progressive deterioration of ES-COPD, patient has not been eating well for over a month and this is likely the end stage process of a life long smoker.  I recommend no abx and no steroids at this point, simply rehabilitation and smoking cessation.  Will consult nutrition for assistance with dietary requirement.  Will f/u with you.  Patient seen and examined, agree with above note.  I dictated the care and orders written for this patient under my direction.  Koren Bound, M.D. [phone]

## 2012-02-11 NOTE — Telephone Encounter (Signed)
Daughter called back and stated pt is not able to coem in. They think it is better just to take her to the ED and be evaluated instead of bring her into the office. I advised if possible to bring her in to be seen by RA. She satted she will speak with pt's sister and see what they decide. Will forward to RA so he is aware.

## 2012-02-11 NOTE — Telephone Encounter (Signed)
I spoke with pt and she stated she is going to see if she can get a ride or not. She will call me back

## 2012-02-11 NOTE — ED Notes (Signed)
Per EMS pt has had increasing SOB for 2 weeks managed with home meds and rest.  This morning pt was unable to manage.  History of COPD.  EMS gave total of 15 albuterol, 0.5 atrovent and 125 solumedrol.  Lung sounds diminished bilaterally and expiratory wheezes.  Pt on neb treatment on arrival at 6L.  Sinus on EMS monitor and EKG unremarkable.  20 gauge L forearm

## 2012-02-11 NOTE — H&P (Signed)
Internal Medicine Teaching Service Attending Note Date: 02/11/2012  Patient name: [name]  Medical record number: [id number]  Date of birth: Jan 24, 1930   I have seen and evaluated [name] and discussed their care with the Residency Team.  78 yo F with hx of COPD (never intubated, on home O2 2l) was previously seen in hospital 10-12 and sent home on 10-13. At home she continued to have SOB and was seen by her pulmonary MD and started on doxy and steroids. Since her last admit she has had worsening ability to do her ADLs. She has had increased SOB as well. No fever or chills, no CP.      Marland Kitchen albuterol  5 mg Nebulization Once  . ipratropium  0.5 mg Nebulization Q6H  . levalbuterol  0.63 mg Nebulization Q6H     Physical Exam: Blood pressure 152/96, pulse 94, temperature 97.9 F (36.6 C), temperature source Oral, resp. rate 16, SpO2 95.00%. General appearance: alert, cooperative and mild distress Eyes: negative findings: pupils equal, round, reactive to light and accomodation Throat: normal findings: no thrush. dry.  Neck: no adenopathy Lungs: diminished breath sounds bibasilar and rhonchi bibasilar Heart: regular rate and rhythm Abdomen: normal findings: bowel sounds normal and soft, non-tender Extremities: edema no pedal  Lab results: Results for orders placed during the hospital encounter of 02/11/12 (from the past 24 hour(s))  CBC WITH DIFFERENTIAL     Status: Abnormal   Collection Time   02/11/12 10:46 AM      Component Value Range   WBC 10.0  4.0 - 10.5 K/uL   RBC 4.73  3.87 - 5.11 MIL/uL   Hemoglobin 14.1  12.0 - 15.0 g/dL   HCT 40.9  81.1 - 91.4 %   MCV 87.3  78.0 - 100.0 fL   MCH 29.8  26.0 - 34.0 pg   MCHC 34.1  30.0 - 36.0 g/dL   RDW 78.2  95.6 - 21.3 %   Platelets 223  150 - 400 K/uL   Neutrophils Relative 84 (*) 43 - 77 %   Neutro Abs 8.4 (*) 1.7 - 7.7 K/uL   Lymphocytes Relative 10 (*) 12 - 46 %   Lymphs Abs 1.0  0.7 - 4.0 K/uL   Monocytes Relative  5  3 - 12 %   Monocytes Absolute 0.5  0.1 - 1.0 K/uL   Eosinophils Relative 1  0 - 5 %   Eosinophils Absolute 0.1  0.0 - 0.7 K/uL   Basophils Relative 0  0 - 1 %   Basophils Absolute 0.0  0.0 - 0.1 K/uL  BASIC METABOLIC PANEL     Status: Abnormal   Collection Time   02/11/12 10:46 AM      Component Value Range   Sodium 130 (*) 135 - 145 mEq/L   Potassium 3.5  3.5 - 5.1 mEq/L   Chloride 91 (*) 96 - 112 mEq/L   CO2 32  19 - 32 mEq/L   Glucose, Bld 151 (*) 70 - 99 mg/dL   BUN 17  6 - 23 mg/dL   Creatinine, Ser 0.86  0.50 - 1.10 mg/dL   Calcium 57.8  8.4 - 46.9 mg/dL   GFR calc non Af Amer 57 (*) >90 mL/min   GFR calc Af Amer 66 (*) >90 mL/min  POCT I-STAT 3, BLOOD GAS (G3+)     Status: Abnormal   Collection Time   02/11/12 11:57 AM      Component Value Range  pH, Arterial 7.484 (*) 7.350 - 7.450   pCO2 arterial 41.9  35.0 - 45.0 mmHg   pO2, Arterial 55.0 (*) 80.0 - 100.0 mmHg   Bicarbonate 31.5 (*) 20.0 - 24.0 mEq/L   TCO2 33  0 - 100 mmol/L   O2 Saturation 90.0     Acid-Base Excess 7.0 (*) 0.0 - 2.0 mmol/L   Patient temperature 98.6 F     Collection site RADIAL, ALLEN'S TEST ACCEPTABLE     Drawn by RT     Sample type ARTERIAL      Imaging results:  Dg Chest Port 1 View  02/11/2012  *RADIOLOGY REPORT*  Clinical Data: Shortness of breath, weakness  PORTABLE CHEST - 1 VIEW  Comparison: 02/06/2012  Findings: The heart and pulmonary vascularity are stable. Postsurgical changes are again seen.  The lungs are clear bilaterally.  No acute bony abnormality is seen.  IMPRESSION: No acute abnormality.   Original Report Authenticated By: Phillips Odor, M.D.     Assessment and Plan: I agree with the formulated Assessment and Plan with the following changes:  COPD  Home O2  Tobacco use CAD  Would- cont O2  Continue bronchodilators  Defer to pulm on use of steroids (greatly appreciate their eval)  Hold on anbx (she is afebrile and her CXR shows no acute abn)  We spoke with pt  and she does not want to be intubated for long periods. I suggest we defer decision regarding marking her permanently DNR to her primary pulmonary. Agree with pt that it may be very difficult to extubate her once intubated.   Ginnie Smart, MD 10/17/20132:44 PM

## 2012-02-11 NOTE — ED Notes (Signed)
Admitting MD at bedside.

## 2012-02-11 NOTE — Telephone Encounter (Signed)
Seems like she needs to be seen sooner - please double book me for today or tomorrow

## 2012-02-11 NOTE — H&P (Signed)
Hospital Admission Note Date: 02/11/2012  Patient name: [name] Medical record number: [id number] Date of birth: 1929/05/06 Age: 78 y.o. Gender: female PCP: Kaleen Mask, MD   Service:  Internal Medicine Teaching Service  Attending Physician:  Dr. Ninetta Lights  First Contact:  Dr. Earlene Plater   Pager:  [phone] Second Contact:  Dr. Anselm Jungling Pager: [phone]     After 5PM, weekends, and holidays: First Contact:              Pager: [phone] Second Contact:         Pager: [phone]   Chief Complaint:  Shortness of breath   History of Present Illness:  This is a 78 year old woman with COPD, CHF, paroxysmal atrial fibrillation, CAD, and hypertension; presenting to the ED with fatigue and dyspnea.  Onset was about 2 weeks ago.  According to the records, a telephone encounter with Dr. Sherene Sires on 10/7 documents doxycycline and prednisone being called in for her.  She was then admitted by the teaching service on 10/12 for a COPD exacerbation.  At that time her symptoms were increased sputum production, sputum purulence, and dyspnea.  She was treated with steroids and antibiotics and then discharged on 10/13 on doxycycline but not prednisone.  Dyspnea and fatigue continued after discharge; it did not worsen, but it has not improved.  She was seen by a pulmonology NP on 10/14 and they switched her ACE-I to an ARB in hopes of that would help with frequent COPD exacerbations.  On 10/16, Dr. Craige Cotta called in a prescription for 5mg  prednisone and she was unable to make it to her appointment with Dr. Vassie Loll today. Her symptoms of dyspnea and fatigue are primarily with exertion.  Moving from her chair to the toilet produces dyspnea.  She is unable to lie flat and has been sleeping in her reclining chair since discharge.  She has a cough, but it is not terribly worse than normal and is productive of only thick, clear sputum.  She does not report a fever.  She reports nausea, chest pain, and dizziness only during  coughing spells.  She denies abdominal pain, dysuria, hematuria, hematochezia.  She does have constipation for which she takes laxatives for.   Review of Systems: Constitutional: Positive for malaise/fatigue. Negative for fever and chills.  HENT: Positive for congestion. Negative for hearing loss and ear pain.   Eyes: Negative.   Respiratory: Positive for cough, sputum production and shortness of breath. Negative for hemoptysis.   Cardiovascular: Positive for orthopnea. Negative for chest pain.  Gastrointestinal: Positive for constipation. Negative for nausea, vomiting, abdominal pain, diarrhea and blood in stool.  Genitourinary: Negative for dysuria and hematuria.  Neurological: Positive for weakness. Negative for dizziness, sensory change and headaches.    Medical History: Past Medical History  Diagnosis Date  . CAD (coronary artery disease)     s/p CABG x5 (2005)   . Hypertension   . Hyperlipidemia   . GI bleed     AVMs  . Aneurysm, aortic     thoracic aorta, stable at 4.1 cm, chest CT, May, 2012  . Syncope     Nitroglycerin plus a diuretic, April, 2009  . Hyponatremia     Chronic. Felt secondary to SIADH   . COPD (chronic obstructive pulmonary disease)   . Tobacco abuse   . Bradycardia   . Carotid artery disease     Hx of endarterectomy. Doppler October, 2011, stable, 0-39% RIC A., 40-59% LICA  . Tuberculosis  Exposures to tuberculosis 1970s, has tested negative by the health Department  . Atrial fibrillation     Paroxysmal with RVR 07/2011, not felt to be a coumadin candidate secondary to hx of GIB/AVM  . Venous stasis of lower extremity     Chronic  . Ejection fraction     EF 55-60%, echo, April, 2013  . CHF with left ventricular diastolic dysfunction, NYHA class 2     Diastolic dysfunction  . AAA (abdominal aortic aneurysm)   . On home O2     2L N/C    Surgical History: Past Surgical History  Procedure Date  . Cholecystectomy   . Carotid endartercetomy     . Abdominal hysterectomy   . Coronary artery bypass graft 2005    Home Medications: Current Outpatient Rx  Name Route Sig Dispense Refill  . ASPIRIN 81 MG PO TABS Oral Take 81 mg by mouth daily.      . ATORVASTATIN CALCIUM 10 MG PO TABS Oral Take 1 tablet (10 mg total) by mouth at bedtime. 90 tablet 4  . BISOPROLOL FUMARATE 10 MG PO TABS Oral Take 1 tablet (10 mg total) by mouth daily. 90 tablet 4  . BUDESONIDE-FORMOTEROL FUMARATE 160-4.5 MCG/ACT IN AERO Inhalation Inhale 2 puffs into the lungs 2 (two) times daily.    Marland Kitchen DILTIAZEM HCL ER COATED BEADS 240 MG PO CP24 Oral Take 1 capsule (240 mg total) by mouth daily. 30 capsule 5  . DOCUSATE SODIUM 100 MG PO CAPS Oral Take 100 mg by mouth 2 (two) times daily.      . ERGOCALCIFEROL 50000 UNITS PO CAPS  1 capsule by mouth every other week    . IPRATROPIUM BROMIDE NA Nasal Place 2 sprays into the nose as needed. For allergies    . LEVALBUTEROL TARTRATE 45 MCG/ACT IN AERO Inhalation Inhale 2 puffs into the lungs every 4 (four) hours as needed. Shortness of breath    . LOSARTAN POTASSIUM 50 MG PO TABS Oral Take 50 mg by mouth daily.    Marland Kitchen NITROGLYCERIN 0.4 MG SL SUBL Sublingual Place 0.4 mg under the tongue every 5 (five) minutes x 3 doses as needed. Chest pain    . POTASSIUM CHLORIDE CRYS ER 20 MEQ PO TBCR Oral Take 20 mEq by mouth 2 (two) times daily.    Marland Kitchen PREDNISONE 5 MG PO TABS Oral Take 5 mg by mouth daily.    . SELENIUM SULFIDE 2.5 % EX LOTN Topical Apply 1 application topically daily as needed. For dermatitis    . SENNA-DOCUSATE SODIUM 8.6-50 MG PO TABS Oral Take 1 tablet by mouth daily.    Marland Kitchen TIOTROPIUM BROMIDE MONOHYDRATE 18 MCG IN CAPS Inhalation Place 18 mcg into inhaler and inhale daily.      Marland Kitchen PANTOPRAZOLE SODIUM 40 MG PO TBEC Oral Take 1 tablet (40 mg total) by mouth daily. 90 tablet 3    Allergies: Allergies as of 02/11/2012 - Review Complete 02/11/2012  Allergen Reaction Noted  . Codeine Other (See Comments)   . Ranitidine hcl  Other (See Comments)   . Sulfonamide derivatives Nausea And Vomiting   . Penicillins Rash and Other (See Comments)     Family History: Family History  Problem Relation Age of Onset  . Stroke Sister   . Heart failure Mother   . Cancer Sister     Breast and Lung   . Cancer Brother     breast cancer    Social History: History   Social History  .  Marital Status: Widowed    Spouse Name: N/A    Number of Children: 1  . Years of Education: N/A   Occupational History  . retired     [employer]   Social History Main Topics  . Smoking status: Current Every Day Smoker -- 0.5 packs/day for 59 years    Types: Cigarettes  . Smokeless tobacco: Not on file   Comment: has had 2-3 cigs since  . Alcohol Use: No  . Drug Use: Not on file  . Sexually Active: Not on file   Other Topics Concern  . Not on file   Social History Narrative  . No narrative on file    Physical Exam: Vitals: Blood pressure 152/96, pulse 94, temperature 97.9 F (36.6 C), temperature source Oral, resp. rate 16, SpO2 95.00%. Constitutional:  Alert and oriented; in no acute distress.  Nose: Turbinates are erythematous and swollen, there is a small polyp on the left upper turbinate. Mouth/Throat: Uvula is midline, oropharynx is clear and moist and mucous membranes are normal.  Eyes: Conjunctivae are normal. Pupils are equal, round, and reactive to light.  Neck: Neck supple. Normal carotid pulses present. No JVD. Cardiovascular: Normal rate; regular rhythm; normal S1 and S2; systolic murmur auscultated throughout the precordium, 2/6, consistent with mitral regurgitation; no rubs, gallops, or clicks appreciated. Respiratory: Effort and breath sounds are normal, no dullness with percussion. GI: Soft and non-tender, no masses, normal bowel sounds. Lymphadenopathy: No cervical or supraclavicular lymphadenopathy appreciated. Extremities:  No peripheral edema. Neurological: Strength and sensation are intact.  Patellar  reflexes are 2+ bilaterally. Skin: Skin is warm, dry and intact; normal turgor. Pulses:  Radial pulses are 2+ and equal bilaterally.  Dorsalis pedis and posterior tibial were 1+ and equal bilaterally.   Lab results: Basic Metabolic Panel:  Basename 02/11/12 1046  NA 130*  K 3.5  CL 91*  CO2 32  GLUCOSE 151*  BUN 17  CREATININE 0.93  CALCIUM 10.0  MG --  PHOS --   CBC:  Basename 02/11/12 1046  WBC 10.0  NEUTROABS 8.4*  HGB 14.1  HCT 41.3  MCV 87.3  PLT 223    Imaging results:  Dg Chest Port 1 View 02/11/2012   FINDINGS: The heart and pulmonary vascularity are stable. Postsurgical changes are again seen.  The lungs are clear bilaterally.  No acute bony abnormality is seen. IMPRESSION: No acute abnormality.    Other results: EKG Results:  02/11/2012 Rate:  89 PR:  160 QRS:  92 QTc:  453 EKG: unchanged from previous tracings, normal sinus rhythm, nonspecific ST and T waves changes, Q waves in aVF.   Assessment & Plan by Problem:  1.   Chronic obstructive pulmonary disease, stage III:  She is currently recovering from an acute exacerbation.  Pulmonary function tests from 2011 demonstrated an FEV1/FVC ratio of 37 and an FEV1 of 47%, making this stage III and severe.  At home she takes tiotropium and budesonide-formoterol as well as levalbuterol as needed.  She has recently finished a course of doxycycline and she is still taking prednisone 5mg  daily for the exacerbation.  Although she does report dyspnea, she does not have a worsened cough or purulent sputum, so I do not believe she is having an acute exacerbation of COPD.  She may still be recovering from the effects of the previous exacerbation though, leading to the subjective feelings of fatigue and dyspnea.  We will treat her her with ipratropium and levalbuterol as needed and her usual  tiotropium and budesonide-formoterol scheduled, but we will not continue treatment with prednisone or antibiotics.  She has recently  quit smoking (1 week ago) after a very long smoking history. - ipratropium nebulizer every six hours - levalbuterol QID and as needed - budesonide-formoterol 2 puffs BID - tiotropium daily  2.   Chronic diastolic congestive heart failure:  Echocardiogram from April demonstrated a normal systolic function (EF 55 to 60) and normal diastolic function, yet she is followed by Dr. Myrtis Ser who, in his last note from August, reports CHF with levt ventricular diastolic dysfunction, NYHA class 2.  If she does in fact have chronic congestive heart failure, I believe it is stable right now.  Her clear lungs, absence of JVD, and absence of peripheral edema are reassuring.  A pro-BNP from October 12 was 334 (normal), whereas this was 1920 in April.  She is clinically dehydrated so we will not continue furosemide right now.  Rather, we will carefully hydrate her. - hold furosemide for now  3.   Metabolic alkalosis:  Her acid-base status, based on the ABG from today, is a primary metabolic alkalosis with full repiratory compensation.  The most likely cause of this is contraction alkalosis from dehydration and furosemide use.  She has had very poor oral intake since leaving the hospital and has not been drinking much.  She has continued using furosemide daily.  We will try to correct this by carefully hydrating her. - hydrate with normal saline at 100 mL/hr  4.   Fatigue:  The subjective fatigue that has brought this patient to our attention is likely multifactorial with her histories of COPD and CHF.  I think it a combination of dehydration since she has continued taking furosemide despite very poor oral intake and sequela of her recent COPD exacerbation. - physical therapy consult  4.   Hyponatremia:  Likely secondary to furosemide use.  We will watch this closely as we hydrate her.  5.   Hypochloremia:  Likely secondary furosemide and the metabolic alkalosis.  We will watch this closely as we hydrate her.  6.    Hypertension:  She was recently swtiched from lisinopril to losartan.  She also takes bisoprolol.  At presentation, she was hypertensive at 195/68 and then 152/96 several hours later. - losartan 50mg  daily - bisoprolol 10mg  daily  7.   Paroxysmal atrial fibrillation:  She is currently in sinus rhythm.  We will keep her on her home medications of diltiazem,  - diltiazem 240mg  daily  8.   Coronary artery disease:  History of a five vessel CABG in 2005.  Currently no chest pain and an unchanged EKG that is consistent with prior inferior infarction.   - sublingual nitroglycerin PRN - aspirin 81mg  daily - atorvastatin 10mg  qHS  9.   Prophylaxis: - enoxaparin 40mg  [place] daily for VTE prophylaxis - senna-docusate 100mg  BID for bowel regimen - pantoprazole 40mg  daily for peptic ulcer prophylaxis   Signed by:  Dorthula Rue. Earlene Plater, MD PGY-I, Internal Medicine  02/11/2012, 2:47 PM

## 2012-02-11 NOTE — ED Notes (Signed)
Post neb treatment rn notes no wheezing bilaterally

## 2012-02-11 NOTE — ED Provider Notes (Signed)
History     CSN: [id number]  Arrival date & time 02/11/12  1010   First MD Initiated Contact with Patient 02/11/12 1014      Chief Complaint  Patient presents with  . Shortness of Breath     Patient is a 78 y.o. female presenting with shortness of breath. The history is provided by the patient.  Shortness of Breath  The current episode started more than 2 weeks ago. The onset was gradual. The problem occurs continuously. The problem has been gradually worsening. The problem is severe. The symptoms are relieved by rest. The symptoms are aggravated by activity. Associated symptoms include cough, shortness of breath and wheezing. Pertinent negatives include no chest pain and no fever.  Pt presents for SOB She has long h/o COPD she is on home oxygen 2L She reports for over two weeks she has had increasing SOB and dyspnea on exertion No cp/sob No vomiting/diarrhea No rectal bleeding reported No abd pain She reports recent fall but no head injury.  No neck or back injury.  No extremity injury reported No current HA at this time  Past Medical History  Diagnosis Date  . CAD (coronary artery disease)     s/p CABG x5 (2005)   . Hypertension   . Hyperlipidemia   . GI bleed     AVMs  . Aneurysm, aortic     thoracic aorta, stable at 4.1 cm, chest CT, May, 2012  . Syncope     Nitroglycerin plus a diuretic, April, 2009  . Hyponatremia     Chronic. Felt secondary to SIADH   . COPD (chronic obstructive pulmonary disease)   . Tobacco abuse   . Bradycardia   . Carotid artery disease     Hx of endarterectomy. Doppler October, 2011, stable, 0-39% RIC A., 40-59% LICA  . Tuberculosis     Exposures to tuberculosis 1970s, has tested negative by the health Department  . Atrial fibrillation     Paroxysmal with RVR 07/2011, not felt to be a coumadin candidate secondary to hx of GIB/AVM  . Venous stasis of lower extremity     Chronic  . Ejection fraction     EF 55-60%, echo, April, 2013  .  CHF with left ventricular diastolic dysfunction, NYHA class 2     Diastolic dysfunction  . AAA (abdominal aortic aneurysm)   . On home O2     2L N/C    Past Surgical History  Procedure Date  . Cholecystectomy   . Carotid endartercetomy   . Abdominal hysterectomy   . Coronary artery bypass graft 2005    Family History  Problem Relation Age of Onset  . Stroke Sister   . Heart failure Mother   . Cancer Sister     Breast and Lung   . Cancer Brother     breast cancer    History  Substance Use Topics  . Smoking status: Current Every Day Smoker -- 0.5 packs/day for 59 years    Types: Cigarettes  . Smokeless tobacco: Not on file   Comment: has had 2-3 cigs since  . Alcohol Use: No    OB History    Grav Para Term Preterm Abortions TAB SAB Ect Mult Living                  Review of Systems  Constitutional: Negative for fever.  Respiratory: Positive for cough, shortness of breath and wheezing.   Cardiovascular: Negative for chest pain.  All other  systems reviewed and are negative.    Allergies  Codeine; Ranitidine hcl; Sulfonamide derivatives; and Penicillins  Home Medications   Current Outpatient Rx  Name Route Sig Dispense Refill  . ASPIRIN 81 MG PO TABS Oral Take 81 mg by mouth daily.      . ATORVASTATIN CALCIUM 10 MG PO TABS Oral Take 1 tablet (10 mg total) by mouth at bedtime. 90 tablet 4  . BISOPROLOL FUMARATE 10 MG PO TABS Oral Take 1 tablet (10 mg total) by mouth daily. 90 tablet 4  . BUDESONIDE-FORMOTEROL FUMARATE 160-4.5 MCG/ACT IN AERO Inhalation Inhale 2 puffs into the lungs 2 (two) times daily.    Marland Kitchen DILTIAZEM HCL ER COATED BEADS 240 MG PO CP24 Oral Take 1 capsule (240 mg total) by mouth daily. 30 capsule 5  . DOCUSATE SODIUM 100 MG PO CAPS Oral Take 100 mg by mouth 2 (two) times daily.      . ERGOCALCIFEROL 50000 UNITS PO CAPS  1 capsule by mouth every other week    . IPRATROPIUM BROMIDE NA Nasal Place 2 sprays into the nose as needed. For allergies      . LEVALBUTEROL TARTRATE 45 MCG/ACT IN AERO Inhalation Inhale 2 puffs into the lungs every 4 (four) hours as needed. Shortness of breath    . LOSARTAN POTASSIUM 50 MG PO TABS Oral Take 50 mg by mouth daily.    Marland Kitchen NITROGLYCERIN 0.4 MG SL SUBL Sublingual Place 0.4 mg under the tongue every 5 (five) minutes x 3 doses as needed. Chest pain    . POTASSIUM CHLORIDE CRYS ER 20 MEQ PO TBCR Oral Take 20 mEq by mouth 2 (two) times daily.    Marland Kitchen PREDNISONE 5 MG PO TABS Oral Take 5 mg by mouth daily.    . SELENIUM SULFIDE 2.5 % EX LOTN Topical Apply 1 application topically daily as needed. For dermatitis    . SENNA-DOCUSATE SODIUM 8.6-50 MG PO TABS Oral Take 1 tablet by mouth daily.    Marland Kitchen TIOTROPIUM BROMIDE MONOHYDRATE 18 MCG IN CAPS Inhalation Place 18 mcg into inhaler and inhale daily.      Marland Kitchen PANTOPRAZOLE SODIUM 40 MG PO TBEC Oral Take 1 tablet (40 mg total) by mouth daily. 90 tablet 3    BP 195/68  Pulse 88  Temp 97.9 F (36.6 C) (Oral)  Resp 16  SpO2 95%  Physical Exam CONSTITUTIONAL: Well developed/well nourished HEAD AND FACE: Normocephalic/atraumatic EYES: EOMI/PERRL ENMT: Mucous membranes moist NECK: supple no meningeal signs SPINE:entire spine nontender, No bruising/crepitance/stepoffs noted to spine Chest - nontender, no bruising noted CV: S1/S2 noted, no murmurs/rubs/gallops noted LUNGS: wheezing bilaterally no apparent distress ABDOMEN: soft, nontender, no rebound or guarding GU:no cva tenderness NEURO: Pt is awake/alert, moves all extremitiesx4 EXTREMITIES: pulses normal, full ROM, no tenderness, no deformity noted.   SKIN: warm, color normal PSYCH: no abnormalities of mood noted  ED Course  Procedures   Labs Reviewed  CBC WITH DIFFERENTIAL  BASIC METABOLIC PANEL   16:10 AM Spoke to dr Molli Knock with pulmonary She is managed by pulmonary as outpatient They will see in consultation but likely need med admit.  She is failing outpatient therapy and unable to walk due to  fatigue/SOB  11:57 AM Pt had been admitted to [facility] and just discharged I called them back to see patient and potentially readmit D/w resident to see patient Pt stable at this time  MDM  Nursing notes including past medical history and social history reviewed and considered in documentation Previous  records reviewed and considered- h/o COPD xrays reviewed and considered Labs/vital reviewed and considered        Date: 02/11/2012  Rate: 89  Rhythm: normal sinus rhythm  QRS Axis: normal  Intervals: normal  ST/T Wave abnormalities: nonspecific ST changes  Conduction Disutrbances:none  Narrative Interpretation:   Old EKG Reviewed: unchanged    Joya Gaskins, MD 02/11/12 1158

## 2012-02-12 DIAGNOSIS — E873 Alkalosis: Secondary | ICD-10-CM | POA: Diagnosis present

## 2012-02-12 DIAGNOSIS — I719 Aortic aneurysm of unspecified site, without rupture: Secondary | ICD-10-CM

## 2012-02-12 DIAGNOSIS — I509 Heart failure, unspecified: Secondary | ICD-10-CM

## 2012-02-12 DIAGNOSIS — E860 Dehydration: Secondary | ICD-10-CM | POA: Diagnosis present

## 2012-02-12 DIAGNOSIS — I2510 Atherosclerotic heart disease of native coronary artery without angina pectoris: Secondary | ICD-10-CM

## 2012-02-12 DIAGNOSIS — R0989 Other specified symptoms and signs involving the circulatory and respiratory systems: Secondary | ICD-10-CM

## 2012-02-12 DIAGNOSIS — R0609 Other forms of dyspnea: Secondary | ICD-10-CM

## 2012-02-12 DIAGNOSIS — I5030 Unspecified diastolic (congestive) heart failure: Secondary | ICD-10-CM

## 2012-02-12 DIAGNOSIS — E871 Hypo-osmolality and hyponatremia: Secondary | ICD-10-CM

## 2012-02-12 DIAGNOSIS — I4891 Unspecified atrial fibrillation: Secondary | ICD-10-CM

## 2012-02-12 DIAGNOSIS — I779 Disorder of arteries and arterioles, unspecified: Secondary | ICD-10-CM

## 2012-02-12 DIAGNOSIS — Z9981 Dependence on supplemental oxygen: Secondary | ICD-10-CM

## 2012-02-12 DIAGNOSIS — R5383 Other fatigue: Secondary | ICD-10-CM

## 2012-02-12 DIAGNOSIS — K922 Gastrointestinal hemorrhage, unspecified: Secondary | ICD-10-CM

## 2012-02-12 DIAGNOSIS — I359 Nonrheumatic aortic valve disorder, unspecified: Secondary | ICD-10-CM

## 2012-02-12 MED ORDER — ENSURE COMPLETE PO LIQD
237.0000 mL | ORAL | Status: DC | PRN
  Administered 2012-02-14: 237 mL via ORAL

## 2012-02-12 MED ORDER — BIOTENE DRY MOUTH MT LIQD
15.0000 mL | Freq: Two times a day (BID) | OROMUCOSAL | Status: DC
  Administered 2012-02-12 – 2012-02-15 (×7): 15 mL via OROMUCOSAL

## 2012-02-12 MED ORDER — IPRATROPIUM BROMIDE 0.02 % IN SOLN: 0.5000 mg | Freq: Four times a day (QID) | RESPIRATORY_TRACT | Status: DC | PRN

## 2012-02-12 NOTE — Progress Notes (Signed)
Subjective:    Interval Events:  Patient feels better this morning.  Denies being in pain.  Only dyspneic with ambulation.    Objective:    Vital Signs:   Temp:  [97.5 F (36.4 C)-97.9 F (36.6 C)] 97.5 F (36.4 C) (10/18 0624) Pulse Rate:  [65-108] 65  (10/18 0624) Resp:  [16-28] 20  (10/18 0624) BP: (127-195)/(54-96) 150/58 mmHg (10/18 0624) SpO2:  [92 %-100 %] 96 % (10/18 0805) Weight:  [171 lb 11.8 oz (77.9 kg)] 171 lb 11.8 oz (77.9 kg) (10/18 0500) Last BM Date: 02/09/12   Weights: Filed Weights   02/12/12 0201 02/12/12 0500  Weight: 171 lb 11.8 oz (77.9 kg) 171 lb 11.8 oz (77.9 kg)    Intake/Output:   Intake/Output Summary (Last 24 hours) at 02/12/12 0809 Last data filed at 02/12/12 0430  Gross per 24 hour  Intake   1430 ml  Output    400 ml  Net   1030 ml      Physical Exam: GENERAL:  alert, oriented, in no distress ENT:  moist mucosa LUNGS:  clear to auscultation but with decreased sounds bilaterally,  HEART:  normal rate and regular rhythm EXTREMITIES:  no edema    Labs:  No new labs or radiology studies.    Medications:    Infusions:    . sodium chloride 100 mL/hr at 02/12/12 3244     Scheduled Medications:    . albuterol  5 mg Nebulization Once  . antiseptic oral rinse  15 mL Mouth Rinse BID  . aspirin EC  81 mg Oral Daily  . atorvastatin  10 mg Oral QHS  . bisoprolol  10 mg Oral Daily  . budesonide-formoterol  2 puff Inhalation BID  . diltiazem  240 mg Oral Daily  . enoxaparin (LOVENOX) injection  40 mg Subcutaneous Q24H  . ipratropium  0.5 mg Nebulization Q6H  . levalbuterol  0.63 mg Nebulization Q6H  . losartan  50 mg Oral Daily  . pantoprazole  40 mg Oral Daily  . sennosides-docusate sodium  1 tablet Oral BID  . tiotropium  18 mcg Inhalation Daily  . DISCONTD: aspirin  81 mg Oral Daily     PRN Medications: levalbuterol, levalbuterol, nitroGLYCERIN, ondansetron (ZOFRAN) IV, ondansetron, selenium sulfide     Assessment/ Plan:    1.  Chronic obstructive pulmonary disease, stage III:  She is currently recovering from an acute exacerbation. Pulmonary function tests from 2011 demonstrated an FEV1/FVC ratio of 37 and an FEV1 of 47%, making this stage III and severe. At home she takes tiotropium and budesonide-formoterol as well as levalbuterol MDI as needed. She has recently finished a course of doxycycline and she was still taking prednisone 5mg  daily for the exacerbation. Although she does report dyspnea, she does not have a worsened cough or purulent sputum, so I do not believe she is having an acute exacerbation of COPD. She may still be recovering from the effects of the previous exacerbation though, leading to the subjective feelings of fatigue and dyspnea. We will treat her her with ipratropium and levalbuterol as needed and her usual tiotropium and budesonide-formoterol scheduled, but we will not continue treatment with prednisone or antibiotics. She has recently quit smoking (1 week ago) after a very long smoking history.  - ipratropium and levalbuterol nebulizer as needed - budesonide-formoterol 2 puffs BID  - tiotropium daily   2.   Chronic diastolic congestive heart failure:  Echocardiogram from April demonstrated a normal systolic function (EF 55  to 60) and normal diastolic function, yet she is followed by Dr. Myrtis Ser who, in his last note from August, reports CHF with levt ventricular diastolic dysfunction, NYHA class 2. If she does in fact have chronic congestive heart failure, I believe it is stable right now. Her clear lungs, absence of JVD, and absence of peripheral edema are reassuring. A pro-BNP from October 12 was 334 (normal), whereas this was 1920 in April. She is clinically dehydrated so we will not continue furosemide right now. Rather, we will carefully hydrate her.  - hold furosemide for now  3.   Metabolic alkalosis:  Her acid-base status, based on the ABG from today, is a primary  metabolic alkalosis with full repiratory compensation. The most likely cause of this is contraction alkalosis from dehydration and furosemide use. She has had very poor oral intake since leaving the hospital and has not been drinking much. She has continued using furosemide daily. We carefully hydrated her overnight with IVF and she is subjectively feeling better this morning.  I encouraged her to continue drinking plenty of water, but we will not continue IV hydration.   4.   Dehydration:  The subjective fatigue that has brought this patient to our attention is likely multifactorial with her histories of COPD and CHF, but I think that dehydration is the principal cause since she has continued taking furosemide despite very poor oral intake and sequela of her recent COPD exacerbation.  - physical therapy consulted  4.   Hyponatremia:  Likely secondary to furosemide use.  5.   Hypochloremia:  Likely secondary furosemide and the metabolic alkalosis.   6.   Hypertension:  She was recently swtiched from lisinopril to losartan. She also takes bisoprolol. At presentation, she was hypertensive at 195/68 and then 152/96 several hours later.  Overnight, she was 127/54 and then 150/58 this morning. - losartan 50mg  daily  - bisoprolol 10mg  daily   7.   Paroxysmal atrial fibrillation:  She is currently in sinus rhythm. We will keep her on her home medications of diltiazem,  - diltiazem 240mg  daily  8.   Coronary artery disease:  History of a five vessel CABG in 2005. Currently no chest pain and an unchanged EKG that is consistent with prior inferior infarction.  - sublingual nitroglycerin PRN  - aspirin 81mg  daily  - atorvastatin 10mg  qHS  9.   Prophylaxis:  - enoxaparin 40mg  [place] daily for VTE prophylaxis  - senna-docusate 100mg  BID for bowel regimen  - pantoprazole 40mg  daily for peptic ulcer prophylaxis  10. Disposition:  May be appropriate to discharge home today; waiting on physical therapy  evaluation.     Length of Stay: 1 days   Signed by:  Dorthula Rue. Earlene Plater, MD PGY-I, Internal Medicine Pager [phone]  02/12/2012, 8:09 AM

## 2012-02-12 NOTE — Consult Note (Addendum)
Patient ZO:XWRU LIANETTE [name]      DOB: 28-Sep-1929      EAV:[id number]     Consult Note from the Palliative Medicine Team at [facility]    Consult Requested by: Dr. Delford Field    PCP: Karen Mask, MD Reason for Consultation: confirm goals Assist with possible home with hospice    Phone Number:[phone]  Assessment of patients Current state: Patient is an awesome 78 year old female with advanced COPD Gold's stage III who was canning vegetables and driving up until early last week.  Her Daughter in law [name] relates her family have all had an upper respiratory infection which them effect her Mother in Social worker.  The patient was admitted with increasing shortness of breath last Sunday and discharged to home the following day. She returned this week with increasing symptoms of weakness and shortness of breath. The patient is known to Dr. Delford Field and Dr. Vassie Loll and after seeing her today Dr. Delford Field contacted [place] to assist with confirming goals of care in the face of advanced cold stage III disease. The patient had communicated with Dr. Delford Field regarding her CODE STATUS. She elected no CPR but treatment for her atrial fibrillation if necessary and on ACLS manner was proved. The patient would like to go home and maximize her care at home after discussing the concept of hospice which was presented to her by Dr. Delford Field, the patient has elected discharge to home with consideration for hospice care. She did express her son is concerned about her DO NOT RESUSCITATE status.   Goals of Care: 1.  Code Status: DNR    2. Scope of Treatment: At this time the patient would like to consider as needed treatment for an exacerbation of her COPD with regard to antibiotic therapy. She would also appreciate attention to her cardiac paroxysmal atrial fibrillation. But overall she is not taking aggressive curative care in the face of her future ill health. She understands if her condition stabilizes that she may be discharged from  hospice care. 4. Disposition: Home with home hospice referral. Please note that this patient is at high risk for returning to the hospital despite having the support of hospice care.   3. Symptom Management:   1. Anxiety/Agitation: At this time it appears that she has never had a behavioral plan for controlling her anxiety during periods when her breathing is exacerbated. We talked about using a house and sitting from a then consideration for nebulizer treatments when she becomes excessively tachypneic. I would encourage the primary service to establish a step-by-step plan 4 reacting to shortness of breath that would include these steps. At this time the patient is reasonably functional and I'm not so sure that benzodiazepines would be appropriate at this time but in the near future should she continue to have significant exacerbation of her then a low-dose benzodiazepine versus low-dose opiate would be appropriate. It will be helpful to have hospice in house to see how they can add to this care plan. 2. Pain: None described at this time so Tylenol for basic aches and pains is appropriate 3. Bowel Regimen: Continue Senokot S 4. Dyspnea: The patient has a nebulizer machine at home but does not have any medications. She states when she uses her puffers they're okay but they are not adequate at this time consideration for nebulizer treatments would be appropriate for her if approved by pulmonary. I've also instructed the patient to use a house a.m. when she gets the feeling that she can't  breathe to assist her in calming and moving air. If hospice notes increasing exacerbation of her dyspneic events low-dose Roxanol 2.5 mg q. 2 hours when necessary could be utilized as part of her rescue plan.  4. Psychosocial: The patient lives in her own home but is close to her son and daughter in law who checks on her regularly. Patient used to work for [employer]. She currently drives for herself but that is  limited at times by her pulmonary status. She enjoys canning and cooking  5. Spiritual: the patient has not expressed at about faith but is a religious person    Patient Documents Completed or Given: Document Given Completed  Advanced Directives Pkt    MOST    DNR    Gone from My Sight    Hard Choices      Brief HPI: Patient is an 78 year old white female with known atrial fibrillation, and COPD. She was recently admitted for an exacerbation of COPD and discharged to home. She returns with increasing weakness and shortness of breath. The patient states that in general her health has been status quo to perhaps declining. She states that she has increasing periods where she is unable to complete a task without shortness of breath including walking back and forth from her car.    ROS: Currently no chest pain shortness of breath fatigue, nausea, vomiting ,or diarrhea.    PMH:  Past Medical History  Diagnosis Date  . CAD (coronary artery disease)     s/p CABG x5 (2005)   . Hypertension   . Hyperlipidemia   . GI bleed     AVMs  . Aneurysm, aortic     thoracic aorta, stable at 4.1 cm, chest CT, May, 2012  . Syncope     Nitroglycerin plus a diuretic, April, 2009  . Hyponatremia     Chronic. Felt secondary to SIADH   . COPD (chronic obstructive pulmonary disease)   . Tobacco abuse   . Bradycardia   . Carotid artery disease     Hx of endarterectomy. Doppler October, 2011, stable, 0-39% RIC A., 40-59% LICA  . Tuberculosis     Exposures to tuberculosis 1970s, has tested negative by the health Department  . Atrial fibrillation     Paroxysmal with RVR 07/2011, not felt to be a coumadin candidate secondary to hx of GIB/AVM  . Venous stasis of lower extremity     Chronic  . Ejection fraction     EF 55-60%, echo, April, 2013  . CHF with left ventricular diastolic dysfunction, NYHA class 2     Diastolic dysfunction  . AAA (abdominal aortic aneurysm)   . On home O2     2L N/C  .  Complication of anesthesia   . Shortness of breath      PSH: Past Surgical History  Procedure Date  . Cholecystectomy   . Carotid endartercetomy   . Abdominal hysterectomy   . Coronary artery bypass graft 2005   I have reviewed the FH and SH and  If appropriate update it with new information. Allergies  Allergen Reactions  . Codeine Other (See Comments)    unknown  . Ranitidine Hcl Other (See Comments)    unknown  . Sulfonamide Derivatives Nausea And Vomiting  . Penicillins Rash and Other (See Comments)    unknown   Scheduled Meds:   . antiseptic oral rinse  15 mL Mouth Rinse BID  . aspirin EC  81 mg Oral Daily  .  atorvastatin  10 mg Oral QHS  . bisoprolol  10 mg Oral Daily  . budesonide-formoterol  2 puff Inhalation BID  . diltiazem  240 mg Oral Daily  . enoxaparin (LOVENOX) injection  40 mg Subcutaneous Q24H  . losartan  50 mg Oral Daily  . pantoprazole  40 mg Oral Daily  . sennosides-docusate sodium  1 tablet Oral BID  . tiotropium  18 mcg Inhalation Daily  . DISCONTD: ipratropium  0.5 mg Nebulization Q6H  . DISCONTD: levalbuterol  0.63 mg Nebulization Q6H   Continuous Infusions:   . sodium chloride 100 mL/hr at 02/12/12 0237   PRN Meds:.feeding supplement, ipratropium, levalbuterol, levalbuterol, nitroGLYCERIN, ondansetron (ZOFRAN) IV, ondansetron, selenium sulfide    BP 150/50  Pulse 64  Temp 97.9 F (36.6 C) (Oral)  Resp 20  Wt 77.9 kg (171 lb 11.8 oz)  SpO2 91%   PPS: 50% (60% on a good day)   Intake/Output Summary (Last 24 hours) at 02/12/12 2208 Last data filed at 02/12/12 1300  Gross per 24 hour  Intake   1310 ml  Output      0 ml  Net   1310 ml   LBM:     02/09/2012                 Stool Softner: Continue sennaS  Physical Exam:  General: No acute distress able to complete sentences without difficulty  HEENT:  Normocephalic, atraumatic, pupils equal round reactive to light, extraocular muscles are intact, mucous membranes are moist    Chest:   decreased but clear to auscultation with distant breath sounds, no rhonchi rales or wheezes are present  CVS: At this time regular rate and rhythm positive S1 and S2  Abdomen: Soft mildly obese nontender nondistended with positive bowel sounds Ext: No clubbing, cyanosis, or edem Neuro: Awake alert oriented cranial nerves II through XII are intact power appears to be 5 out of 5   Labs: CBC    Component Value Date/Time   WBC 10.0 02/11/2012 1046   RBC 4.73 02/11/2012 1046   HGB 14.1 02/11/2012 1046   HCT 41.3 02/11/2012 1046   PLT 223 02/11/2012 1046   MCV 87.3 02/11/2012 1046   MCH 29.8 02/11/2012 1046   MCHC 34.1 02/11/2012 1046   RDW 13.3 02/11/2012 1046   LYMPHSABS 1.0 02/11/2012 1046   MONOABS 0.5 02/11/2012 1046   EOSABS 0.1 02/11/2012 1046   BASOSABS 0.0 02/11/2012 1046      CMP     Component Value Date/Time   NA 130* 02/11/2012 1046   K 3.5 02/11/2012 1046   CL 91* 02/11/2012 1046   CO2 32 02/11/2012 1046   GLUCOSE 151* 02/11/2012 1046   BUN 17 02/11/2012 1046   CREATININE 0.93 02/11/2012 1046   CALCIUM 10.0 02/11/2012 1046   PROT 6.5 02/06/2012 0747   ALBUMIN 3.7 02/06/2012 0747   AST 14 02/06/2012 0747   ALT 14 02/06/2012 0747   ALKPHOS 84 02/06/2012 0747   BILITOT 0.4 02/06/2012 0747   GFRNONAA 57* 02/11/2012 1046   GFRAA 66* 02/11/2012 1046    Chest Xray Reviewed/Impressions: No acute abnormality     Time In Time Out Total Time Spent with Patient Total Overall Time  445 pm  545 pm 60 min  60 min    Greater than 50%  of this time was spent counseling and coordinating care related to the above assessment and plan. Coleson Kant L. Ladona Ridgel, MD MBA The Palliative Medicine Team at [facility]  Team Phone: [phone] Pager: [phone]

## 2012-02-12 NOTE — Progress Notes (Signed)
Palliative Medicine Consult called to phone by Dr Delford Field; spoke with patient and d-i-l [name] at bedside meeting scheduled for today, Friday 02/12/12 @ 4:30 pm   Karen David, RN 02/12/2012, 5:59 PM Palliative Medicine Team RN Liaison [phone]

## 2012-02-12 NOTE — H&P (Signed)
Pt seen, examined, discussed with h/o's. I agree with their notes. Please see my note as well.

## 2012-02-12 NOTE — Evaluation (Signed)
Physical Therapy Evaluation Patient Details Name: [name] MRN: [id number] DOB: [birth year] Today's Date: 02/12/2012 Time: 4098-1191 PT Time Calculation (min): 34 min  PT Assessment / Plan / Recommendation Clinical Impression  78 yo adm with COPD exacerbation with h/o recent upper respiratory symptoms for ~2 weeks. She feels she is weaker than usual due to recent inactivity due to illness. Per grandson, family is making arrangements to provide incr assist/supervision upon her d/c home (although not 24/7 coverage). Feel pt will benefit from HHPT to further assess balance in her home environment (pt with fall on her ramp 2 weeks ago--as illness began).    PT Assessment  Patient needs continued PT services    Follow Up Recommendations  Home health PT;Supervision/Assistance - 24 hour    Does the patient have the potential to tolerate intense rehabilitation      Barriers to Discharge Decreased caregiver support      Equipment Recommendations  None recommended by PT    Recommendations for Other Services OT consult   Frequency Min 3X/week    Precautions / Restrictions Precautions Precautions: Fall;Other (comment) (home O2)   Pertinent Vitals/Pain Denies pain SaO2 93% on 2L at rest SaO2 decr to 88% on 2L with walking SaO2 recovered to 91% on 2L with 2 minute standing rest break      Mobility  Bed Mobility Bed Mobility: Supine to Sit;Sitting - Scoot to Edge of Bed Supine to Sit: 7: Independent;HOB elevated (has elevated HOB at home) Sitting - Scoot to Edge of Bed: 7: Independent Transfers Transfers: Sit to Stand;Stand to Sit Sit to Stand: 5: Supervision;With upper extremity assist;From bed Stand to Sit: 5: Supervision;With upper extremity assist;With armrests;To chair/3-in-1 Details for Transfer Assistance: supervision for safety due to reporting she feels slightly weak Ambulation/Gait Ambulation/Gait Assistance: 5: Supervision Ambulation Distance (Feet): 110  Feet Assistive device: Rolling walker Ambulation/Gait Assistance Details: monitoring O2 throughout ambulation; pt down to 88% on 2L, incr back to 91% after 2 minute standing rest period Gait Pattern: Step-through pattern;Decreased stride length    Shoulder Instructions     Exercises Other Exercises Other Exercises: Discussed pt recently began OP Pulmonary Rehab (has been 5 times) and hopes to continue the program to continue to regain her strength. Informed pt she will likely need another MD referral now that she has been hospitalized.   PT Diagnosis: Difficulty walking;Generalized weakness  PT Problem List: Decreased activity tolerance;Decreased balance;Decreased mobility;Decreased knowledge of use of DME;Cardiopulmonary status limiting activity PT Treatment Interventions: DME instruction;Gait training;Functional mobility training;Therapeutic activities;Therapeutic exercise;Balance training;Patient/family education   PT Goals Acute Rehab PT Goals PT Goal Formulation: With patient Time For Goal Achievement: 02/19/12 Potential to Achieve Goals: Good Pt will go Sit to Supine/Side: Independently PT Goal: Sit to Supine/Side - Progress: Goal set today Pt will go Sit to Stand: with modified independence;with upper extremity assist PT Goal: Sit to Stand - Progress: Goal set today Pt will go Stand to Sit: with modified independence;with upper extremity assist PT Goal: Stand to Sit - Progress: Goal set today Pt will Ambulate: >150 feet;with modified independence;with least restrictive assistive device PT Goal: Ambulate - Progress: Goal set today Pt will Perform Home Exercise Program: with supervision, verbal cues required/provided PT Goal: Perform Home Exercise Program - Progress: Goal set today  Visit Information  Last PT Received On: 02/12/12 Assistance Needed: +1    Subjective Data  Subjective: Pt reports she fell backwards going up the ramp into her home 2 weeks ago. She was beginning  to  feel poorly (coughing, congested) when this happened. She states she just got "off-balance." Patient Stated Goal: doesn't want to go home too soon and have to come right back to hospital   Prior Functioning  Home Living Lives With: Alone Available Help at Discharge: Family;Available PRN/intermittently;Other (Comment) Type of Home: House Home Access: Ramped entrance Home Layout: One level Bathroom Shower/Tub: Other (comment) (does sponge bath at bathroom sink) Bathroom Toilet: Standard Bathroom Accessibility: Yes How Accessible: Accessible via walker Home Adaptive Equipment: Hospital bed;Straight cane;Walker - rolling;Walker - standard;Walker - four wheeled (? 3n1 in attic) Additional Comments: daughter-in-law does errands, grocery shopping, some cooking, some cleaning, grandson plans to rebuild ramp so it is not so steep (discussed 12" run needed for every 1" of rise) Prior Function Level of Independence: Needs assistance Needs Assistance: Light Housekeeping (independent with basic ADLs) Able to Take Stairs?: Yes (independent if handrail) Driving: Yes Vocation: Retired Musician: No difficulties    Cognition  Overall Cognitive Status: Appears within functional limits for tasks assessed/performed Behavior During Session: [facility] for tasks performed    Extremity/Trunk Assessment Right Lower Extremity Assessment RLE ROM/Strength/Tone: [facility] for tasks assessed Left Lower Extremity Assessment LLE ROM/Strength/Tone: [facility] for tasks assessed Trunk Assessment Trunk Assessment: Normal   Balance    End of Session PT - End of Session Equipment Utilized During Treatment: Gait belt;Oxygen Activity Tolerance: Patient limited by fatigue;Other (comment) (limited by decr SaO2 on 2L) Patient left: in chair;with call bell/phone within reach;with family/visitor present Nurse Communication: Mobility status;Other (comment) (need for HHPT)  GP Functional Assessment Tool Used: clinical  judgement Functional Limitation: Mobility: Walking and moving around Mobility: Walking and Moving Around Current Status [phone]): At least 1 percent but less than 20 percent impaired, limited or restricted Mobility: Walking and Moving Around Goal Status [phone]): At least 1 percent but less than 20 percent impaired, limited or restricted   [name] 02/12/2012, 12:23 PM  Pager [phone]

## 2012-02-12 NOTE — Progress Notes (Signed)
Internal Medicine Teaching Service Attending Note Date: 02/12/2012  Patient name: [name]  Medical record number: [id number]  Date of birth: Jan 15, 1930   I have seen and evaluated [name] and discussed their care with the Residency Team.   Without complaints this AM, comfortable.     Marland Kitchen albuterol  5 mg Nebulization Once  . antiseptic oral rinse  15 mL Mouth Rinse BID  . aspirin EC  81 mg Oral Daily  . atorvastatin  10 mg Oral QHS  . bisoprolol  10 mg Oral Daily  . budesonide-formoterol  2 puff Inhalation BID  . diltiazem  240 mg Oral Daily  . enoxaparin (LOVENOX) injection  40 mg Subcutaneous Q24H  . losartan  50 mg Oral Daily  . pantoprazole  40 mg Oral Daily  . sennosides-docusate sodium  1 tablet Oral BID  . tiotropium  18 mcg Inhalation Daily  . DISCONTD: aspirin  81 mg Oral Daily  . DISCONTD: ipratropium  0.5 mg Nebulization Q6H  . DISCONTD: levalbuterol  0.63 mg Nebulization Q6H     Physical Exam: Blood pressure 150/58, pulse 65, temperature 97.5 F (36.4 C), temperature source Oral, resp. rate 20, weight 77.9 kg (171 lb 11.8 oz), SpO2 96.00%. General appearance: alert, cooperative and no distress Resp: rhonchi bilaterally and mild Cardio: regular rate and rhythm GI: normal findings: bowel sounds normal and soft, non-tender  Lab results: Results for orders placed during the hospital encounter of 02/11/12 (from the past 24 hour(s))  CBC WITH DIFFERENTIAL     Status: Abnormal   Collection Time   02/11/12 10:46 AM      Component Value Range   WBC 10.0  4.0 - 10.5 K/uL   RBC 4.73  3.87 - 5.11 MIL/uL   Hemoglobin 14.1  12.0 - 15.0 g/dL   HCT 08.6  57.8 - 46.9 %   MCV 87.3  78.0 - 100.0 fL   MCH 29.8  26.0 - 34.0 pg   MCHC 34.1  30.0 - 36.0 g/dL   RDW 62.9  52.8 - 41.3 %   Platelets 223  150 - 400 K/uL   Neutrophils Relative 84 (*) 43 - 77 %   Neutro Abs 8.4 (*) 1.7 - 7.7 K/uL   Lymphocytes Relative 10 (*) 12 - 46 %   Lymphs Abs 1.0  0.7 - 4.0 K/uL     Monocytes Relative 5  3 - 12 %   Monocytes Absolute 0.5  0.1 - 1.0 K/uL   Eosinophils Relative 1  0 - 5 %   Eosinophils Absolute 0.1  0.0 - 0.7 K/uL   Basophils Relative 0  0 - 1 %   Basophils Absolute 0.0  0.0 - 0.1 K/uL  BASIC METABOLIC PANEL     Status: Abnormal   Collection Time   02/11/12 10:46 AM      Component Value Range   Sodium 130 (*) 135 - 145 mEq/L   Potassium 3.5  3.5 - 5.1 mEq/L   Chloride 91 (*) 96 - 112 mEq/L   CO2 32  19 - 32 mEq/L   Glucose, Bld 151 (*) 70 - 99 mg/dL   BUN 17  6 - 23 mg/dL   Creatinine, Ser 2.44  0.50 - 1.10 mg/dL   Calcium 01.0  8.4 - 27.2 mg/dL   GFR calc non Af Amer 57 (*) >90 mL/min   GFR calc Af Amer 66 (*) >90 mL/min  POCT I-STAT 3, BLOOD GAS (G3+)  Status: Abnormal   Collection Time   02/11/12 11:57 AM      Component Value Range   pH, Arterial 7.484 (*) 7.350 - 7.450   pCO2 arterial 41.9  35.0 - 45.0 mmHg   pO2, Arterial 55.0 (*) 80.0 - 100.0 mmHg   Bicarbonate 31.5 (*) 20.0 - 24.0 mEq/L   TCO2 33  0 - 100 mmol/L   O2 Saturation 90.0     Acid-Base Excess 7.0 (*) 0.0 - 2.0 mmol/L   Patient temperature 98.6 F     Collection site RADIAL, ALLEN'S TEST ACCEPTABLE     Drawn by RT     Sample type ARTERIAL      Imaging results:  Dg Chest Port 1 View  02/11/2012  *RADIOLOGY REPORT*  Clinical Data: Shortness of breath, weakness  PORTABLE CHEST - 1 VIEW  Comparison: 02/06/2012  Findings: The heart and pulmonary vascularity are stable. Postsurgical changes are again seen.  The lungs are clear bilaterally.  No acute bony abnormality is seen.  IMPRESSION: No acute abnormality.   Original Report Authenticated By: Phillips Odor, M.D.     Assessment and Plan: I agree with the formulated Assessment and Plan with the following changes:   COPD HTN  Cont inhalers, O2 as needed (uses at home) Cont to manage her BP, known diastolic heart dz, and fluid status.

## 2012-02-12 NOTE — Progress Notes (Signed)
INITIAL ADULT NUTRITION ASSESSMENT Date: 02/12/2012   Time: 3:26 PM Reason for Assessment: consult  INTERVENTION: 1.  General healthful diet; encourage PO intake.  Pt currently consuming 50-100% of meals, averaging >75% of meals which likely meets estimated needs.  2.   Supplements; Ensure Complete prn.  Pt intake currently sufficient to meet needs, however PO variable.  Care note placed to offer supplement as needed if meal intake decreased.   DOCUMENTATION CODES Per approved criteria  -Not Applicable    ASSESSMENT: Female 78 y.o.  Dx: Dehydration  Hx:  Past Medical History  Diagnosis Date  . CAD (coronary artery disease)     s/p CABG x5 (2005)   . Hypertension   . Hyperlipidemia   . GI bleed     AVMs  . Aneurysm, aortic     thoracic aorta, stable at 4.1 cm, chest CT, May, 2012  . Syncope     Nitroglycerin plus a diuretic, April, 2009  . Hyponatremia     Chronic. Felt secondary to SIADH   . COPD (chronic obstructive pulmonary disease)   . Tobacco abuse   . Bradycardia   . Carotid artery disease     Hx of endarterectomy. Doppler October, 2011, stable, 0-39% RIC A., 40-59% LICA  . Tuberculosis     Exposures to tuberculosis 1970s, has tested negative by the health Department  . Atrial fibrillation     Paroxysmal with RVR 07/2011, not felt to be a coumadin candidate secondary to hx of GIB/AVM  . Venous stasis of lower extremity     Chronic  . Ejection fraction     EF 55-60%, echo, April, 2013  . CHF with left ventricular diastolic dysfunction, NYHA class 2     Diastolic dysfunction  . AAA (abdominal aortic aneurysm)   . On home O2     2L N/C  . Complication of anesthesia   . Shortness of breath    Past Surgical History  Procedure Date  . Cholecystectomy   . Carotid endartercetomy   . Abdominal hysterectomy   . Coronary artery bypass graft 2005    Related Meds:  Scheduled Meds:   . antiseptic oral rinse  15 mL Mouth Rinse BID  . aspirin EC  81 mg Oral  Daily  . atorvastatin  10 mg Oral QHS  . bisoprolol  10 mg Oral Daily  . budesonide-formoterol  2 puff Inhalation BID  . diltiazem  240 mg Oral Daily  . enoxaparin (LOVENOX) injection  40 mg Subcutaneous Q24H  . losartan  50 mg Oral Daily  . pantoprazole  40 mg Oral Daily  . sennosides-docusate sodium  1 tablet Oral BID  . tiotropium  18 mcg Inhalation Daily  . DISCONTD: aspirin  81 mg Oral Daily  . DISCONTD: ipratropium  0.5 mg Nebulization Q6H  . DISCONTD: levalbuterol  0.63 mg Nebulization Q6H   Continuous Infusions:   . sodium chloride 100 mL/hr at 02/12/12 0237   PRN Meds:.ipratropium, levalbuterol, levalbuterol, nitroGLYCERIN, ondansetron (ZOFRAN) IV, ondansetron, selenium sulfide   Ht:  5'9"  Wt: 171 lb 11.8 oz (77.9 kg)  Ideal Wt:    72.7 kg % Ideal Wt: 107%  Usual Wt: 170-177 lbs % Usual Wt: 100%  There is no height on file to calculate BMI.  Food/Nutrition Related Hx: unable to assess  Labs:  CMP     Component Value Date/Time   NA 130* 02/11/2012 1046   K 3.5 02/11/2012 1046   CL 91* 02/11/2012 1046  CO2 32 02/11/2012 1046   GLUCOSE 151* 02/11/2012 1046   BUN 17 02/11/2012 1046   CREATININE 0.93 02/11/2012 1046   CALCIUM 10.0 02/11/2012 1046   PROT 6.5 02/06/2012 0747   ALBUMIN 3.7 02/06/2012 0747   AST 14 02/06/2012 0747   ALT 14 02/06/2012 0747   ALKPHOS 84 02/06/2012 0747   BILITOT 0.4 02/06/2012 0747   GFRNONAA 57* 02/11/2012 1046   GFRAA 66* 02/11/2012 1046    CBC    Component Value Date/Time   WBC 10.0 02/11/2012 1046   RBC 4.73 02/11/2012 1046   HGB 14.1 02/11/2012 1046   HCT 41.3 02/11/2012 1046   PLT 223 02/11/2012 1046   MCV 87.3 02/11/2012 1046   MCH 29.8 02/11/2012 1046   MCHC 34.1 02/11/2012 1046   RDW 13.3 02/11/2012 1046   LYMPHSABS 1.0 02/11/2012 1046   MONOABS 0.5 02/11/2012 1046   EOSABS 0.1 02/11/2012 1046   BASOSABS 0.0 02/11/2012 1046    Intake: 80% x1 Output:   Intake/Output Summary (Last 24 hours) at  02/12/12 1541 Last data filed at 02/12/12 1300  Gross per 24 hour  Intake   1790 ml  Output    400 ml  Net   1390 ml   No BM since admission  Diet Order: Regular  Supplements/Tube Feeding: none at this time  IVF:    sodium chloride Last Rate: 100 mL/hr at 02/12/12 0237    Estimated Nutritional Needs:   Kcal: 1940-2180 Protein: 77-93g Fluid: >2.1 L/day  Pt admitted with shortness of breath, MD dx with FTT.  RD attempted to see pt this am, however pt was up walking in hall with PT.  Noted shortness of breath.  RD returned to pt room this afternoon.  Active d/c order in chart. Pt meeting with MD re:  Discharge planning and potential palliative involvement at time of visit. RD visit not appropriate.  Please consult RD if needed if GOC discussion occurs over the weekend.  RD unable to assess nutrition status PTA, unable to assess for potential malnutrition.  RD to follow.   NUTRITION DIAGNOSIS: -Increased nutrient needs (NI-5.1).  Status: Ongoing  RELATED TO: increased energy expenditure  AS EVIDENCE BY: increased WOB, worsening COPD  MONITORING/EVALUATION(Goals): 1.  Food/Beverage; pt consuming >75% of meals  EDUCATION NEEDS: -Education not appropriate at this time    Loyce Dys, MS RD LDN Clinical Inpatient Dietitian Pager: [phone] Weekend/After hours pager: [phone]

## 2012-02-12 NOTE — Progress Notes (Signed)
Case management notified about the need for home health PT,OT and RN.

## 2012-02-12 NOTE — Consult Note (Addendum)
  Name: [name] MRN: [id number] DOB: [birth year]    LOS: 1  Referring Provider:  Teaching service Reason for Referral:  SOB/FTT  PULMONARY / CRITICAL CARE MEDICINE  HPI:  78 yo WF. 1 ppd smoker since age 29 (62 years) O2 dependent since 2010. She reports decline over 21 days, sneezing, coughing without purulent sputum, lower ext edema. No complaints of chest pain, rigors or fevers. She has acutely become more SOB since her recent dc from [facility]. She was seen in the office by T. Parrett ANP 02-08-12. She also had an appointment with Dr, Vassie Loll for 10/17 but was to SOB to attend. Transported to [facility] ED 10/17. Was noted to be SOB with any activity,afebrile, and hypertensive 195/68. PCCM asked to consult for COPD exacerbation.     Events Since Admission:   Current Status: NAD, up in chair visiting with family.   Vital Signs: Temp:  [97.5 F (36.4 C)-97.9 F (36.6 C)] 97.5 F (36.4 C) (10/18 0624) Pulse Rate:  [65-108] 65  (10/18 0624) Resp:  [16-28] 20  (10/18 0624) BP: (127-152)/(54-96) 150/58 mmHg (10/18 0624) SpO2:  [95 %-100 %] 96 % (10/18 0805) Weight:  [171 lb 11.8 oz (77.9 kg)] 171 lb 11.8 oz (77.9 kg) (10/18 0500)  Physical Examination: General: [name] NAD at complete rest Neuro:  Intact HEENT:  No LAN Neck:  No JVD Cardiovascular:  HSR RRR Lungs:  CTA Abdomen:  Soft + bs Musculoskeletal:  intact Skin:  +1 le edema  Dg Chest Port 1 View  02/11/2012  *RADIOLOGY REPORT*  Clinical Data: Shortness of breath, weakness  PORTABLE CHEST - 1 VIEW  Comparison: 02/06/2012  Findings: The heart and pulmonary vascularity are stable. Postsurgical changes are again seen.  The lungs are clear bilaterally.  No acute bony abnormality is seen.  IMPRESSION: No acute abnormality.   Original Report Authenticated By: Phillips Odor, M.D.    Principal Problem:  *Generalized weakness Active Problems:  Hypertension  Hyperlipidemia  Hyponatremia  COPD (chronic obstructive pulmonary disease)  Chronic respiratory failure    ASSESSMENT AND PLAN  PULMONARY  Lab 02/11/12 1157  PHART 7.484*  PCO2ART 41.9  PO2ART 55.0*  HCO3 31.5*  O2SAT 90.0     CT from 5/13  A:   78 yo life long smoker with continued tobacco use despite severe lung disease and O2 dependency.  No overt bronchospasm, CxR cw severe COPD or ES COPD. Progressive deterioration of ES-COPD, patient has not been eating well for over a month and this is likely the end stage process of a life long smoker.  Hypoxemia  P:   BD's, xopenex due to hx of Afib O2 as needed No need for steroids / abx at this time Ensure optimize CHF rx Will plan for pulmonary f/u in office next week   CARDIOVASCULAR  Lab 02/06/12 0747  TROPONINI <0.30  LATICACIDVEN --  PROBNP 334.1    A: CAD, Hx of AF, AAA, post OHS P:  -per primary SVC   Canary Brim, NP-C [facility] Pgr: [phone] or [phone]  Pt seen and examined and I agree with the above note. This pt would benefit from home hospice consult and palliative care consultation before d/c I called pall care to see the pt.    Dorcas Carrow  Beeper  [phone]  Cell  [phone]  If no response or cell goes to voicemail, call beeper [phone]

## 2012-02-12 NOTE — Care Management Note (Signed)
    Page 1 of 2   02/14/2012     5:04:15 PM   CARE MANAGEMENT NOTE 02/14/2012  Patient:  [name], [name]   Account Number:  [id number]  Date Initiated:  02/12/2012  Documentation initiated by:  Ronny Flurry  Subjective/Objective Assessment:     Action/Plan:   Anticipated DC Date:  02/12/2012   Anticipated DC Plan:  HOME W HOME HEALTH SERVICES  In-house referral  Clinical Social Worker      DC Planning Services  CM consult      Choice offered to / List presented to:  C-1 Patient        HH arranged  HH-1 RN  HH-2 PT  HH-3 OT      Status of service:  Completed, signed off Medicare Important Message given?   (If response is "NO", the following Medicare IM given date fields will be blank) Date Medicare IM given:   Date Additional Medicare IM given:    Discharge Disposition:  SKILLED NURSING FACILITY  Per UR Regulation:    If discussed at Long Length of Stay Meetings, dates discussed:    Comments:  02/14/2012 1645 NCM spoke to pt and states she want to go to SNF for rehab. She prefers [facility] because it is near her home. NCM sent message to CSW for referral. Provided pt with list of Hospice Providers. Isidoro Donning RN CCM Case Mgmt phone [phone]   02-12-12 Patient already has home oxygen through [facility]. Patient has portable oxygen tank in room for discharge.  PT recommended Home health PT;Supervision/Assistance - 24 hour . Patient stated she lives alone, daughter - in - law checks on her twice a day, sister in law and grand children are close by and also checks on her . She wants to go home with home health .   Ronny Flurry RN BBSN [phone]

## 2012-02-13 ENCOUNTER — Observation Stay (HOSPITAL_COMMUNITY): Payer: Medicare Other

## 2012-02-13 DIAGNOSIS — R0600 Dyspnea, unspecified: Secondary | ICD-10-CM

## 2012-02-13 LAB — BASIC METABOLIC PANEL
CO2: 30 mEq/L (ref 19–32)
Calcium: 9.5 mg/dL (ref 8.4–10.5)
Creatinine, Ser: 0.85 mg/dL (ref 0.50–1.10)
GFR calc Af Amer: 72 mL/min — ABNORMAL LOW (ref >90)
GFR calc non Af Amer: 62 mL/min — ABNORMAL LOW (ref >90)
Glucose, Bld: 141 mg/dL — ABNORMAL HIGH (ref 70–99)
Glucose, Bld: 105 mg/dL — ABNORMAL HIGH (ref 70–99)
Potassium: 3.6 mEq/L (ref 3.5–5.1)
Sodium: 129 mEq/L — ABNORMAL LOW (ref 135–145)

## 2012-02-13 LAB — PRO B NATRIURETIC PEPTIDE: Pro B Natriuretic peptide (BNP): 1239 pg/mL — ABNORMAL HIGH (ref 0–450)

## 2012-02-13 MED ORDER — LEVALBUTEROL HCL 0.63 MG/3ML IN NEBU
0.6300 mg | INHALATION_SOLUTION | Freq: Four times a day (QID) | RESPIRATORY_TRACT | Status: DC
  Administered 2012-02-13 – 2012-02-15 (×5): 0.63 mg via RESPIRATORY_TRACT
  Filled 2012-02-13 (×11): qty 3

## 2012-02-13 MED ORDER — IPRATROPIUM BROMIDE 0.02 % IN SOLN
0.5000 mg | Freq: Four times a day (QID) | RESPIRATORY_TRACT | Status: DC
  Administered 2012-02-13 – 2012-02-15 (×6): 0.5 mg via RESPIRATORY_TRACT
  Filled 2012-02-13 (×7): qty 2.5

## 2012-02-13 MED ORDER — FUROSEMIDE 10 MG/ML IJ SOLN
40.0000 mg | Freq: Once | INTRAMUSCULAR | Status: AC
  Administered 2012-02-13: 40 mg via INTRAVENOUS
  Filled 2012-02-13: qty 4

## 2012-02-13 NOTE — Progress Notes (Signed)
Subjective: Pt had an episode of sob this am, but now back to baseline.  No congestion or purulent mucus per her history  Objective: Vital signs in last 24 hours: Blood pressure 169/58, pulse 60, temperature 98.2 F (36.8 C), temperature source Oral, resp. rate 18, weight 77.8 kg (171 lb 8.3 oz), SpO2 95.00%.  Intake/Output from previous day: 10/18 0701 - 10/19 0700 In: 360 [P.O.:360] Out: -    Physical Exam:   wd female in nad Nose without purulence or d/c noted. Neck without LN or TMG Chest with decreased bs, no wheezing Cor with rrr LE with mild edema, no cyanosis Alert and oriented, moves all 4.  Seems depressed.    Lab Results:  Basename 02/11/12 1046  WBC 10.0  HGB 14.1  HCT 41.3  PLT 223   BMET  Basename 02/11/12 1046  NA 130*  K 3.5  CL 91*  CO2 32  GLUCOSE 151*  BUN 17  CREATININE 0.93  CALCIUM 10.0    Studies/Results: Dg Chest Port 1 View  02/11/2012  *RADIOLOGY REPORT*  Clinical Data: Shortness of breath, weakness  PORTABLE CHEST - 1 VIEW  Comparison: 02/06/2012  Findings: The heart and pulmonary vascularity are stable. Postsurgical changes are again seen.  The lungs are clear bilaterally.  No acute bony abnormality is seen.  IMPRESSION: No acute abnormality.   Original Report Authenticated By: Phillips Odor, M.D.     Assessment/Plan: Patient Active Hospital Problem List:c  COPD with acute exacerbation: The pt is currently comfortable, with no increased wob.  She has no bronchospasm on exam.  The most important thing at this point is disposition and access to medications.  She has had frequent ER visits, and will require a higher level of care as outpt to keep her stable.  Also need to make sure she has f/u with her primary pulmonologist at discharge.    Barbaraann Share, M.D. 02/13/2012, 9:55 AM

## 2012-02-13 NOTE — Progress Notes (Signed)
Subjective: She states that she felt SOB this morning when she was out of bed but now the SOB is improved to baseline.  She feels that her SOB is the same as when she was admitted to the hospital.  Still feel weak. She was seen by Palliative Care -Dr. Ladona Ridgel this AM.  Objective: Vital signs in last 24 hours: Filed Vitals:   02/13/12 0206 02/13/12 0554 02/13/12 0811 02/13/12 1009  BP: 178/61 169/58  139/58  Pulse: 55 60    Temp:  98.2 F (36.8 C)    TempSrc:      Resp:  18    Weight:  171 lb 8.3 oz (77.8 kg)    SpO2: 93% 95% 95%    Weight change: -3.5 oz (-0.1 kg)  Intake/Output Summary (Last 24 hours) at 02/13/12 1128 Last data filed at 02/13/12 0900  Gross per 24 hour  Intake    600 ml  Output      0 ml  Net    600 ml   Physical Exam: General: alert, well-developed, and cooperative to examination.   Lungs: normal respiratory effort, no accessory muscle use, diffused decreased breath sounds, no crackles, and no wheezes. Heart: normal rate, regular rhythm, no murmur, no gallop, and no rub.  Abdomen: soft, non-tender, normal bowel sounds, no distention, no guarding, no rebound tenderness Pulses: 2+ DP/PT pulses bilaterally Extremities: No cyanosis, clubbing, trace edema on LE bilaterally Neurologic:nonfocal Psych: Oriented X3, memory intact for recent and remote, normally interactive, good eye contact, + anxious appearing, and +depressed appearing.  Lab Results: Basic Metabolic Panel:  Lab 02/13/12 4098 02/11/12 1046  NA 129* 130*  K 3.6 3.5  CL 92* 91*  CO2 30 32  GLUCOSE 141* 151*  BUN 11 17  CREATININE 0.86 0.93  CALCIUM 9.5 10.0  MG -- --  PHOS -- --   Medications: Reviewed Scheduled Meds:   . antiseptic oral rinse  15 mL Mouth Rinse BID  . aspirin EC  81 mg Oral Daily  . atorvastatin  10 mg Oral QHS  . bisoprolol  10 mg Oral Daily  . budesonide-formoterol  2 puff Inhalation BID  . diltiazem  240 mg Oral Daily  . enoxaparin (LOVENOX) injection  40 mg  Subcutaneous Q24H  . losartan  50 mg Oral Daily  . pantoprazole  40 mg Oral Daily  . sennosides-docusate sodium  1 tablet Oral BID  . tiotropium  18 mcg Inhalation Daily   Continuous Infusions:  PRN Meds:.feeding supplement, ipratropium, levalbuterol, levalbuterol, nitroGLYCERIN, ondansetron (ZOFRAN) IV, ondansetron, selenium sulfide Assessment/Plan:  1. Chronic obstructive pulmonary disease, stage III:  physical exam did not demonstrate any wheezes or bronchospasm, O2 sat 91-100% on 3L [place] which makes it unlikely an acute COPD exacerbation. Pulmonary function tests from 2011 demonstrated an FEV1/FVC ratio of 37 and an FEV1 of 47%, making this stage III and severe. At home she takes tiotropium and budesonide-formoterol as well as levalbuterol MDI as needed. She has recently finished a course of doxycycline and she was still taking prednisone 5mg  daily for the exacerbation.  She has recently quit smoking (1 week ago) after a very long smoking history.  -Will schedule ipratropium and levalbuterol nebulizer  - budesonide-formoterol 2 puffs BID  - tiotropium daily  -Will need close follow up with her pulmonologist as outpatient once discharged  2. Chronic diastolic congestive heart failure: Echocardiogram from April demonstrated a normal systolic function (EF 55 to 60) and normal diastolic function, yet she is followed by  Dr. Myrtis Ser who, in his last note from August, reports CHF with levt ventricular diastolic dysfunction, NYHA class 2. Given her SOB and hyponatremia this AM, I will check proBNP and repeat CXR. She may need some diuresis because we have been holding her home Lasix, although she does not appear to be volume overload on physical exam. - Will treat with Lasix pending results.    3. Metabolic alkalosis: pH 7.48/41/55/31. metabolic alkalosis with full repiratory compensation. The most likely cause of this is contraction alkalosis but will have to be careful because she may also be in HF as  well.  4. Dehydration: The subjective fatigue that has brought this patient to our attention is likely multifactorial with her histories of COPD and CHF, but I think that dehydration is the principal cause since she has continued taking furosemide despite very poor oral intake and sequela of her recent COPD exacerbation.  - physical therapy consulted   4. Hyponatremia:  Hypovolemic vs. Hypervolemic hyponatremia.  It is difficult to assess because she was taking lasix as outpatient while not eating and drinking. At the same time, she has HF and has not been taking Lasix since hospital course because we were trying to hydrate her.  With hydration, her Na is now trending down to 129 from 134 on admission.  She is mentating well at this time. She is not on any psych meds. -Will check Urine Osm, Urine K & Na, proBNP, TSH, AM cortisol  6. Hypertension: Her SBP fluctuates between 130-170's. She was recently swtiched from lisinopril to losartan. She also takes bisoprolol at home.  - losartan 50mg  daily  - bisoprolol 10mg  daily   7. Paroxysmal atrial fibrillation: She is currently in sinus rhythm. We will keep her on her home medications of diltiazem. - diltiazem 240mg  daily   8. Coronary artery disease: History of a five vessel CABG in 2005. Currently no chest pain and an unchanged EKG that is consistent with prior inferior infarction.  - sublingual nitroglycerin PRN  - aspirin 81mg  daily  - atorvastatin 10mg  qHS   9. Prophylaxis:  - enoxaparin 40mg  [place] daily for VTE prophylaxis  - senna-docusate 100mg  BID for bowel regimen  - pantoprazole 40mg  daily for peptic ulcer prophylaxis   10. Disposition: pending clinical improvement.  ?SNF with palliative care services to follow. Will consult SW to discuss more about SNF options with patient and family.   LOS: 2 days   [name] 02/13/2012, 11:28 AM

## 2012-02-13 NOTE — Progress Notes (Signed)
Assisted pt to [facility] and pt became very SOB she sat on side of bed purse lip breathing color was WNL lips pink and moist vital sign are charted in epic RT asked to come and assess pt lung sound decreased by clear. Will continue to monitor. Ilean Skill LPN

## 2012-02-13 NOTE — Progress Notes (Signed)
Patient [name]      DOB: 1930/03/28      EAV:[id number]   Palliative Medicine Team at [facility] Progress Note    Subjective: Patient seen and examined this a.m. Noted events overnight of acute onset shortness of breath requiring patient to sit at the edge of the bed. This a.m. patient with significant sensation of dyspnea with just transitioning from the commode to the bedside. Patient continues to report that this behavior is essentially new for her as she had been able to do most things at home without these symptoms. She would have been every now and then exacerbation of shortness of breath but was functional. I reviewed a most form with her which she is going to read over & if she agrees this is what we discussed. I have provided a goldenrod form for discharge to home. I am concerned that if she discharge is to home today that she will return to the hospital very quickly. I asked her "what if"  question to the patient: that what if she would go for some rehabilitation prior to going home? would this be reasonable for her? She said she may think about that as it does seem that she is going to have difficulty caring for herself. Palliative care services to follow skilled facility in transition her further to home with hospice or residential hospice if her condition did not improve or deteriorate.   Filed Vitals:   02/13/12 0554  BP: 169/58  Pulse: 60  Temp: 98.2 F (36.8 C)  Resp: 18   Physical exam: Gen. patient is anxious with some sense of air hunger which calms as we talk. Pupils are equal round and reactive to light, extraocular muscles appeared intact mucous membranes are dry  Chest is very decreased without evidence for significant wheals or wheezes but she does have decreased air movement.  Cardiovascular: Distant but appears to be regular at this time. Extremities no edema Neurologically awake alert oriented not overtly anxious but does show some anxiety over her  decompensated state. She is able to be re\re directed and does become calmer with discussion of the events.     Assessment and plan Patient is an 78 year old white female with known advanced stage III Golds COP, and atrial fibrillation. The patient continues to have intermittent exacerbations which are causing her to not be able to care for herself well. I have reviewed her most recent echo which showed normal ejection fraction and no valvular disease. She does have atrial fibrillation although I am not hearing that on my exam at this time. I do not believe this patient will be able to manage at home, and yet her symptoms are not significant enough to require residential hospice at this time. The patient may be open to skilled nursing facility placement with palliative care services to follow. I would like to feel confident about providing her with antianxiety medication, or a small dose of opiate but at this time it may cause more harm than good since her symptoms are so intermittent. I believe that a supportive environment at reinforces her safety and provide her with her medications would do more good than providing antianxiety medication at this time in her disease process. If the symptoms persist and become more pervasive than it would be definitely appropriate to provide his medications with proper supervision.  1. DO NOT RESUSCITATE DO NOT INTUBATE as confirmed by the patient although she does prefer to treat the treatable for now and not  pursue him a full comfort course. I have therefore completed a most form indicating limited return to the hospital, antibiotic therapy if needed.  2. dyspnea: Continue current medications. I will speak with pulmonology to see if there is any other conditions like nebulizers that we think might help  3. Decreased appetite. This is dependent on food that is provided she stated that yesterday she had a very good breakfast which he enjoyed. This may reflect some  element of depression over her current symptoms and we will continue work with her cognitively on this.  4. Disposition: To be determined I would favor a skilled nursing facility placement with palliative care services to follow. I have discussed this with the resident on call.    Total time with this patient was 30 minutes 8 AM to 8:30 AM   Karen Pillard L. Ladona Ridgel, MD MBA The Palliative Medicine Team at [facility] Phone: [phone] Pager: [phone]

## 2012-02-14 LAB — NA AND K (SODIUM & POTASSIUM), RAND UR
Potassium Urine: 23 mEq/L
Sodium, Ur: 86 mEq/L

## 2012-02-14 LAB — BASIC METABOLIC PANEL
BUN: 11 mg/dL (ref 6–23)
CO2: 30 mEq/L (ref 19–32)
Calcium: 9.3 mg/dL (ref 8.4–10.5)
GFR calc non Af Amer: 60 mL/min — ABNORMAL LOW (ref >90)
Glucose, Bld: 142 mg/dL — ABNORMAL HIGH (ref 70–99)

## 2012-02-14 MED ORDER — POTASSIUM CHLORIDE CRYS ER 20 MEQ PO TBCR
40.0000 meq | EXTENDED_RELEASE_TABLET | Freq: Two times a day (BID) | ORAL | Status: AC
  Administered 2012-02-14 (×2): 40 meq via ORAL
  Filled 2012-02-14 (×2): qty 2

## 2012-02-14 MED ORDER — SODIUM CHLORIDE 0.9 % IJ SOLN
3.0000 mL | Freq: Two times a day (BID) | INTRAMUSCULAR | Status: DC
  Administered 2012-02-14 – 2012-02-15 (×3): 3 mL via INTRAVENOUS

## 2012-02-14 MED ORDER — SALINE SPRAY 0.65 % NA SOLN
1.0000 | NASAL | Status: DC | PRN
  Administered 2012-02-14: 1 via NASAL
  Filled 2012-02-14: qty 44

## 2012-02-14 MED ORDER — FUROSEMIDE 40 MG PO TABS
40.0000 mg | ORAL_TABLET | Freq: Every day | ORAL | Status: DC
  Administered 2012-02-14 – 2012-02-15 (×2): 40 mg via ORAL
  Filled 2012-02-14 (×2): qty 1

## 2012-02-14 NOTE — Progress Notes (Signed)
Subjective: Her SOB is much better after Lasix yesterday, is not struggling for air as much.  She had a net neg (i think it may be higher because she states she urinates a lot yesterday, so I question accuracy of documentation).  She did not sleep well because she was awoken for her breathing treatment. She would like to go to SNF.  Objective: Vital signs in last 24 hours: Filed Vitals:   02/13/12 2008 02/13/12 2228 02/14/12 0634 02/14/12 0700  BP:  150/61 149/72   Pulse:  62 65   Temp:  98.4 F (36.9 C) 98.6 F (37 C)   TempSrc:      Resp:  18 18   Weight:   172 lb 2.9 oz (78.1 kg)   SpO2: 90% 92% 93% 94%   Weight change: 10.6 oz (0.3 kg)  Intake/Output Summary (Last 24 hours) at 02/14/12 0934 Last data filed at 02/14/12 0000  Gross per 24 hour  Intake    240 ml  Output    600 ml  Net   -360 ml   Physical Exam: General: alert, well-developed, and cooperative to examination.   Lungs: normal respiratory effort, no accessory muscle use, diffused decreased breath sounds, no crackles, and no wheezes. Heart: normal rate, regular rhythm, no murmur, no gallop, and no rub.  Abdomen: soft, non-tender, normal bowel sounds, no distention, no guarding, no rebound tenderness Pulses: 2+ DP/PT pulses bilaterally Extremities: No cyanosis, clubbing, trace edema on LE bilaterally Neurologic:nonfocal Psych: Oriented X3, memory intact for recent and remote, normally interactive, good eye contact, + anxious appearing, and +depressed appearing.  Lab Results: Basic Metabolic Panel:  Lab 02/14/12 5784 02/13/12 1759  NA 131* 129*  K 3.2* 4.0  CL 93* 93*  CO2 30 30  GLUCOSE 142* 105*  BUN 11 11  CREATININE 0.88 0.85  CALCIUM 9.3 9.5  MG -- --  PHOS -- --   BNP:  Lab 02/13/12 1210  PROBNP 1239.0*   Studies/Results: Dg Chest Port 1 View  02/13/2012  *RADIOLOGY REPORT*  Clinical Data: Shortness of breath  PORTABLE CHEST - 1 VIEW  Comparison: 02/11/2012; 10/12/201305/10/2010;  08/20/2007; chest CT - 09/25/2010  Findings: Grossly unchanged cardiac silhouette and mediastinal contours post median sternotomy and CABG.  The lungs remain hyperexpanded with flattening of bilateral hemidiaphragms. There is grossly unchanged pleural parenchymal thickening within the medial aspect the right lung apex, grossly unchanged since the 07/2007 examination.  No new focal parenchymal opacities.  No pleural effusion or pneumothorax.  Grossly unchanged bones.  IMPRESSION: Hyperexpanded lungs without acute cardiopulmonary disease.   Original Report Authenticated By: Waynard Reeds, M.D.    Medications: reviewed Scheduled Meds:    . antiseptic oral rinse  15 mL Mouth Rinse BID  . aspirin EC  81 mg Oral Daily  . atorvastatin  10 mg Oral QHS  . bisoprolol  10 mg Oral Daily  . budesonide-formoterol  2 puff Inhalation BID  . diltiazem  240 mg Oral Daily  . enoxaparin (LOVENOX) injection  40 mg Subcutaneous Q24H  . furosemide  40 mg Intravenous Once  . furosemide  40 mg Oral Daily  . ipratropium  0.5 mg Nebulization Q6H  . levalbuterol  0.63 mg Nebulization Q6H  . losartan  50 mg Oral Daily  . pantoprazole  40 mg Oral Daily  . potassium chloride  40 mEq Oral BID  . sennosides-docusate sodium  1 tablet Oral BID  . DISCONTD: tiotropium  18 mcg Inhalation Daily  Continuous Infusions:  PRN Meds:.feeding supplement, levalbuterol, nitroGLYCERIN, ondansetron (ZOFRAN) IV, ondansetron, selenium sulfide, sodium chloride, DISCONTD: ipratropium, DISCONTD: levalbuterol Assessment/Plan:  1. Chronic obstructive pulmonary disease, stage III:  Improving. physical exam did not demonstrate any wheezes or bronchospasm, O2 sat 91-100% on 3L [place] which makes it unlikely an acute COPD exacerbation. Pulmonary function tests from 2011 demonstrated an FEV1/FVC ratio of 37 and an FEV1 of 47%, making this stage III and severe. At home she takes tiotropium and budesonide-formoterol as well as levalbuterol MDI as  needed. She has recently finished a course of doxycycline and she was still taking prednisone 5mg  daily for the exacerbation.  She has recently quit smoking (1 week ago) after a very long smoking history.  -continue schedule ipratropium and levalbuterol nebulizer  - budesonide-formoterol 2 puffs BID  - tiotropium hold while she is getting atrovent nebs treatment in hospital -Will need close follow up with her pulmonologist as outpatient once discharged  2. Chronic diastolic congestive heart failure: Echocardiogram from April demonstrated a normal systolic function (EF 55 to 60) and normal diastolic function, yet she is followed by Dr. Myrtis Ser who, in his last note from August, reports CHF with levt ventricular diastolic dysfunction, NYHA class 2. Given her SOB and hyponatremia both improved after IV lasix yesterday.  Her ProBNP was elevated at 1239 which was increased from 334 on 10/12. - Will resume home PO lasix 40mg  qd -KDur supplement 40mg  qd   3. Metabolic alkalosis: pH 7.48/41/55/31. metabolic alkalosis with full repiratory compensation. The most likely cause of this is contraction alkalosis but will have to be careful because she may also be in HF as well.  4. Hyponatremia:  Hypovolemic vs. Hypervolemic hyponatremia.  It is difficult to assess because she was taking lasix as outpatient while not eating and drinking. At the same time, she has HF and has not been taking Lasix since hospital course because we were trying to hydrate her.  With hydration, her Na  trended down to 129 yesterday from 134 on admission.  She is mentating well at this time. She is not on any psych meds. Urine Osm 316, Urine K 23, Urine Na 86, proBNP: 1239 which is elevated from 10/12 which was 334 , TSH: 0.943 normal, AM cortisol:11.1. Her sodium improved after IV lasix yesterday which is consistent with CHF picture.  Will resume her home PO lasix at 40mg  qd and will need K+ supplementation.    5. Hypertension: Her SBP  fluctuates between 130-180's. She was recently swtiched from lisinopril to losartan. She also takes bisoprolol at home.  - losartan 50mg  daily  - bisoprolol 10mg  daily   6. Paroxysmal atrial fibrillation: She is currently in sinus rhythm. We will keep her on her home medications of diltiazem. - diltiazem 240mg  daily   7. Coronary artery disease: History of a five vessel CABG in 2005. Currently no chest pain and an unchanged EKG that is consistent with prior inferior infarction.  - sublingual nitroglycerin PRN  - aspirin 81mg  daily  - atorvastatin 10mg  qHS   8. Hypokalemia: K 3.2, likely due to diuretics.  Will replete with Kdur x2 doses today.  She will likely need daily KCl replacement while on diuretics.     Prophylaxis:  - enoxaparin 40mg  [place] daily for VTE prophylaxis  - senna-docusate 100mg  BID for bowel regimen  - pantoprazole 40mg  daily for peptic ulcer prophylaxis   Disposition: pending clinical improvement, likely in AM. SNF with palliative care services to follow. SW has  not seen patient yet.   LOS: 3 days   Yumna Ebers 02/14/2012, 9:34 AM

## 2012-02-14 NOTE — Progress Notes (Signed)
Patient [name]      DOB: 12/15/1929      DGU:[id number]   Palliative Medicine Team at [facility] Progress Note    Subjective: Patient states she did not wake up with shortness of breath last night. She states the breathing treatments feel like they are working.  She states when she sat on the edge of the bed she was short of breath for a shorter period of time.   Filed Vitals:   02/14/12 0634  BP: 149/72  Pulse: 65  Temp: 98.6 F (37 C)  Resp: 18   Physical exam:  General : no acute distress. Speech clear PERRL, EOmi, anicteric Chest : air movment much improved today CVS: distant sounds regular Abd: mildly obese, not tender , not distended Ext: warm, no edema, 2 + pulses    Pro BNP: 1239, TSH WNL 0.943  Lab Results  Component Value Date   CREATININE 0.88 02/14/2012   BUN 11 02/14/2012   NA 131* 02/14/2012   K 3.2* 02/14/2012   CL 93* 02/14/2012   CO2 30 02/14/2012   Lab Results  Component Value Date   WBC 10.0 02/11/2012   HGB 14.1 02/11/2012   HCT 41.3 02/11/2012   MCV 87.3 02/11/2012   PLT 223 02/11/2012   Xray reviewed: no infiltrates or chf, hyperexpanded.  Noted lasix give x 1  Assessment and plan: 78 year old white female with Gold's stage III COPD admitted with shortness of breath, DOE.  Patient seems better this am after getting lasix and starting nebs.  Plan for SNF will [facility] is best choice.  Hypnatremia improved with diuresis.  1.  DNR  2. Dyspnea:  Looks like it might be multifactorial.  Agree with nebs, and lasix. Had a normal echo earlier in the year  3.  Nasal dryness add water bottle and nasal saline.  4. Disposition: SNF with PCs when medically stable.   Total time 25 min  Cyndee Giammarco L. Ladona Ridgel, MD MBA The Palliative Medicine Team at [facility] Phone: [phone] Pager: [phone]

## 2012-02-15 DIAGNOSIS — J441 Chronic obstructive pulmonary disease with (acute) exacerbation: Secondary | ICD-10-CM | POA: Diagnosis present

## 2012-02-15 LAB — BASIC METABOLIC PANEL
BUN: 11 mg/dL (ref 6–23)
Chloride: 95 mEq/L — ABNORMAL LOW (ref 96–112)
Creatinine, Ser: 0.84 mg/dL (ref 0.50–1.10)
GFR calc Af Amer: 74 mL/min — ABNORMAL LOW (ref >90)
GFR calc non Af Amer: 64 mL/min — ABNORMAL LOW (ref >90)
Glucose, Bld: 179 mg/dL — ABNORMAL HIGH (ref 70–99)

## 2012-02-15 MED ORDER — FUROSEMIDE 40 MG PO TABS: 40.0000 mg | ORAL_TABLET | Freq: Every day | ORAL | Status: DC

## 2012-02-15 NOTE — Progress Notes (Signed)
HPI: 78 yo WF. 1 ppd smoker since age 78 (62 years) O2 dependent since 2010. She reports decline over 21 days, sneezing, coughing without purulent sputum, lower ext edema. No complaints of chest pain, rigors or fevers. She has acutely become more SOB since her recent dc from [facility]. She was seen in the office by T. Parrett ANP 02-08-12. She also had an appointment with Dr, Vassie Loll for 10/17 but was to SOB to attend. Transported to [facility] ED 10/17. Was noted to be SOB with any activity,afebrile, and hypertensive 195/68. PCCM asked to consult for COPD exacerbation.    Subjective: Still very SOB after any exertion.  Objective: Vital signs in last 24 hours: Blood pressure 139/61, pulse 60, temperature 97.7 F (36.5 C), temperature source Oral, resp. rate 17, height 5\' 9"  (1.753 m), weight 77.111 kg (170 lb), SpO2 99.00%. 2 liters  Intake/Output from previous day: 10/20 0701 - 10/21 0700 In: 420 [P.O.:420] Out: 2200 [Urine:2200]   Physical Exam:   wd female in nad Nose without purulence or d/c noted. Neck without LN or TMG Chest with decreased bs, no wheezing Cor with rrr LE with mild edema, no cyanosis Alert and oriented, moves all 4.  Still Seems depressed.    Lab Results: No results found for this basename: WBC:3,HGB:3,HCT:3,PLT:3 in the last 72 hours BMET  [facility] 02/14/12 0638 02/13/12 1759 02/13/12 1001  NA 131* 129* 129*  K 3.2* 4.0 3.6  CL 93* 93* 92*  CO2 30 30 30   GLUCOSE 142* 105* 141*  BUN 11 11 11   CREATININE 0.88 0.85 0.86  CALCIUM 9.3 9.5 9.5    Studies/Results: Dg Chest Port 1 View  02/13/2012  *RADIOLOGY REPORT*  Clinical Data: Shortness of breath  PORTABLE CHEST - 1 VIEW  Comparison: 02/11/2012; 10/12/201305/10/2010; 08/20/2007; chest CT - 09/25/2010  Findings: Grossly unchanged cardiac silhouette and mediastinal contours post median sternotomy and CABG.  The lungs remain hyperexpanded with flattening of bilateral hemidiaphragms. There is grossly unchanged  pleural parenchymal thickening within the medial aspect the right lung apex, grossly unchanged since the 07/2007 examination.  No new focal parenchymal opacities.  No pleural effusion or pneumothorax.  Grossly unchanged bones.  IMPRESSION: Hyperexpanded lungs without acute cardiopulmonary disease.   Original Report Authenticated By: Waynard Reeds, M.D.     Assessment/Plan:  COPD with acute exacerbation: gold D fev1 47% 2011 - Resolved Plan  SNF on oxygen, spiriva, symbicort and PRN Xopenex HFA Agree w/ pred taper Agree w/ abx Would NOT add ipratropium on top of spiriva (to much anitcholengeric) Need walking oximetry prior to d/c. She may need titration up to 3-4 liters w/ exertion to avoid desaturation episodes.  She has f/u w/ T parrett in our office on 10/24 Appreciate palliative care input - DNR issued - should be given out of facility DNR papers on discharge  PCCM to sign off , available for questions  ALVA,RAKESH V. [phone]    02/15/2012, 9:06 AM

## 2012-02-15 NOTE — Progress Notes (Signed)
Clinical Social Work-CSW confirmed possible offer from [facility] and will notify treatment team for d/c planning- Oley Balm

## 2012-02-15 NOTE — Discharge Summary (Signed)
Patient Name:  [name] MRN: [id number]  PCP: Kaleen Mask, MD DOB:  [birth year]       Date of Admission:  02/11/2012  Date of Discharge:  02/15/2012      Attending Physician: Ginnie Smart, MD         DISCHARGE DIAGNOSES: 1.   COPD 2.   Chronic diastolic CHF 3.   Metabolic alkalosis 4.   Hyponatremia 5.   Hypokalemia 6.   Hypertension 7.   Paroxysmal atrial fibrillation 8.   Coronary artery disease 9.   Dehydration   DISPOSITION AND FOLLOW-UP:   Follow-up Information    Follow up with PARRETT,TAMMY, NP. On 02/18/2012. (Appt at 3:30 pm)    Contact information:   [facility] [address] [place] [address] [phone]       Follow up with Kaleen Mask, MD. On 02/16/2012. (Appt at 9:45 am)    Contact information:   [address] [place] [address] [phone]         Discharge Orders    Future Appointments: Provider: Department: Dept Phone: Center:   02/16/2012 1:30 PM [facility] [phone] None   02/18/2012 1:30 PM [facility] [phone] None   02/18/2012 3:30 PM Julio Sicks, NP [facility] [phone] None   02/23/2012 1:30 PM [facility] [phone] None   02/25/2012 1:30 PM [facility] [phone] None   03/01/2012 1:30 PM [facility] [phone] None   03/02/2012 11:45 AM Luis Abed, MD [facility] [phone] [facility]   03/03/2012 1:30 PM [facility] [phone] None   03/08/2012 1:30 PM [facility] [phone] None   03/10/2012 1:30 PM [facility] [phone] None   03/15/2012 1:30 PM [facility] [phone] None   03/16/2012 11:30 AM Oretha Milch, MD  [facility] [phone] None   03/17/2012 1:30 PM [facility] [phone] None   03/22/2012 1:30 PM [facility] [phone] None   03/24/2012 1:30 PM [facility] [phone] None   03/29/2012 1:30 PM [facility] [phone] None   03/31/2012 1:30 PM [facility] [phone] None   04/05/2012 1:30 PM [facility] [phone] None   04/07/2012 1:30 PM [facility] [phone] None   04/12/2012 1:30 PM [facility] [phone] None   04/14/2012 1:30 PM [facility] [phone] None   04/19/2012 1:30 PM [facility] [phone] None   04/21/2012 1:30 PM [facility] [phone] None   04/26/2012 1:30 PM [facility] [phone] None   04/28/2012 1:30 PM [facility] [phone] None   04/29/2012 2:00 PM Tammy Rogers Seeds, NP [facility] [phone] None     Future Orders Please Complete By Expires   Diet - low sodium heart healthy      Increase activity slowly      Discharge instructions      Comments:   Do not take furosemide (Lasix) until you follow-up with Dr. Jeannetta Nap next Tuesday.   Call MD for:  temperature >100.4      Call MD for:  difficulty breathing, headache or visual disturbances      Call MD for:  extreme fatigue  DISCHARGE MEDICATIONS:   Medication List     As of 02/15/2012  1:42 PM    STOP taking these medications         predniSONE 5 MG tablet   Commonly known as: DELTASONE      TAKE these medications         aspirin 81 MG tablet   Take 81 mg by mouth daily.      atorvastatin 10 MG tablet   Commonly known  as: LIPITOR   Take 1 tablet (10 mg total) by mouth at bedtime.      bisoprolol 10 MG tablet   Commonly known as: ZEBETA   Take 1 tablet (10 mg total) by mouth daily.      budesonide-formoterol 160-4.5 MCG/ACT inhaler   Commonly known as: SYMBICORT   Inhale 2 puffs into the lungs 2 (two) times daily.      diltiazem 240 MG 24 hr capsule   Commonly known as: CARDIZEM CD   Take 1 capsule (240 mg total) by mouth daily.      docusate sodium 100 MG capsule   Commonly known as: COLACE   Take 100 mg by mouth 2 (two) times daily.      ergocalciferol 50000 UNITS capsule   Commonly known as: VITAMIN D2   1 capsule by mouth every other week      furosemide 40 MG tablet   Commonly known as: LASIX   Take 1 tablet (40 mg total) by mouth daily.      IPRATROPIUM BROMIDE NA   Place 2 sprays into the nose as needed. For allergies      levalbuterol 45 MCG/ACT inhaler   Commonly known as: XOPENEX HFA   Inhale 2 puffs into the lungs every 4 (four) hours as needed. Shortness of breath      losartan 50 MG tablet   Commonly known as: COZAAR   Take 50 mg by mouth daily.      nitroGLYCERIN 0.4 MG SL tablet   Commonly known as: NITROSTAT   Place 0.4 mg under the tongue every 5 (five) minutes x 3 doses as needed. Chest pain      pantoprazole 40 MG tablet   Commonly known as: PROTONIX   Take 1 tablet (40 mg total) by mouth daily.      potassium chloride SA 20 MEQ tablet   Commonly known as: K-DUR,KLOR-CON   Take 20 mEq by mouth 2 (two) times daily.      selenium sulfide 2.5 % shampoo   Commonly known as: SELSUN   Apply 1 application topically daily as needed. For dermatitis      sennosides-docusate sodium 8.6-50 MG tablet   Commonly known as: SENOKOT-S   Take 1 tablet by mouth daily.      tiotropium 18 MCG inhalation capsule   Commonly known as: SPIRIVA   Place 18 mcg into inhaler and inhale daily.          CONSULTS:  1.   Pulmonology 2.   Palliative   PROCEDURES PERFORMED:    Dg Chest 2 View  02/06/2012  *RADIOLOGY REPORT*  Clinical Data: Cough  CHEST - 2 VIEW  Comparison: 12/11/2011  Findings: Hyperinflation indicates COPD.  There is mild cardiac silhouette enlargement.  The vascular pattern is normal.  There is no infiltrate or effusion.  There is no significant change compared to the prior examination.  IMPRESSION:  COPD.  No acute findings.   Original Report Authenticated By: Otilio Carpen, M.D.  Dg Chest Port 1 View  02/13/2012  *RADIOLOGY REPORT*  Clinical Data: Shortness of breath  PORTABLE CHEST - 1 VIEW  Comparison: 02/11/2012; 10/12/201305/10/2010; 08/20/2007; chest CT - 09/25/2010  Findings: Grossly unchanged cardiac silhouette and mediastinal contours post median sternotomy and CABG.  The lungs remain hyperexpanded with flattening of bilateral hemidiaphragms. There is grossly unchanged pleural parenchymal thickening within the medial aspect the right lung apex, grossly unchanged since the 07/2007 examination.  No new focal parenchymal opacities.  No pleural effusion or pneumothorax.  Grossly unchanged bones.  IMPRESSION: Hyperexpanded lungs without acute cardiopulmonary disease.   Original Report Authenticated By: Waynard Reeds, M.D.    Dg Chest Port 1 View  02/11/2012  *RADIOLOGY REPORT*  Clinical Data: Shortness of breath, weakness  PORTABLE CHEST - 1 VIEW  Comparison: 02/06/2012  Findings: The heart and pulmonary vascularity are stable. Postsurgical changes are again seen.  The lungs are clear bilaterally.  No acute bony abnormality is seen.  IMPRESSION: No acute abnormality.   Original Report Authenticated By: Phillips Odor, M.D.        ADMISSION DATA: H&P: This is a 78 year old woman with COPD, CHF, paroxysmal atrial fibrillation, CAD, and hypertension; presenting to the ED with fatigue and dyspnea. Onset was about 2 weeks ago. According to the records, a telephone encounter with Dr. Sherene Sires on 10/7 documents doxycycline and prednisone being called  in for her. She was then admitted by the teaching service on 10/12 for a COPD exacerbation. At that time her symptoms were increased sputum production, sputum purulence, and dyspnea. She was treated with steroids and antibiotics and then discharged on 10/13 on doxycycline but not prednisone. Dyspnea and fatigue continued after discharge; it did not worsen, but it has not improved. She was seen by a pulmonology NP on 10/14 and they switched her ACE-I to an ARB in hopes of that would help with frequent COPD exacerbations. On 10/16, Dr. Craige Cotta called in a prescription for 5mg  prednisone and she was unable to make it to her appointment with Dr. Vassie Loll today.  Her symptoms of dyspnea and fatigue are primarily with exertion. Moving from her chair to the toilet produces dyspnea. She is unable to lie flat and has been sleeping in her reclining chair since discharge. She has a cough, but it is not terribly worse than normal and is productive of only thick, clear sputum. She does not report a fever. She reports nausea, chest pain, and dizziness only during coughing spells. She denies abdominal pain, dysuria, hematuria, hematochezia. She does have constipation for which she takes laxatives for.  Physical Exam: Vitals: Blood pressure 152/96, pulse 94, temperature 97.9 F (36.6 C), temperature source Oral, resp. rate 16, SpO2 95.00%.  Constitutional: Alert and oriented; in no acute distress.  Nose: Turbinates are erythematous and swollen, there is a small polyp on the left upper turbinate.  Mouth/Throat: Uvula is midline, oropharynx is clear and moist and mucous membranes are normal.  Eyes: Conjunctivae are normal. Pupils are equal, round, and reactive to light.  Neck: Neck supple. Normal carotid pulses present. No JVD.  Cardiovascular: Normal rate; regular rhythm; normal S1 and S2; systolic murmur auscultated throughout the precordium, 2/6, consistent with mitral regurgitation; no rubs, gallops, or clicks appreciated.    Respiratory: Effort and breath sounds are normal, no dullness with percussion.  GI: Soft and non-tender, no masses, normal bowel sounds.  Lymphadenopathy: No cervical or supraclavicular lymphadenopathy appreciated.  Extremities: No peripheral edema.  Neurological: Strength and sensation  are intact. Patellar reflexes are 2+ bilaterally.  Skin: Skin is warm, dry and intact; normal turgor.  Pulses: Radial pulses are 2+ and equal bilaterally. Dorsalis pedis and posterior tibial were 1+ and equal bilaterally.  Labs: Basic Metabolic Panel:  Basename  02/11/12 1046   NA  130*   K  3.5   CL  91*   CO2  32   GLUCOSE  151*   BUN  17   CREATININE  0.93   CALCIUM  10.0   MG  --   PHOS  --    CBC:  Basename  02/11/12 1046   WBC  10.0   NEUTROABS  8.4*   HGB  14.1   HCT  41.3   MCV  87.3   PLT  223     HOSPITAL COURSE: 1.  Chronic obstructive pulmonary disease, stage III:  She was recently admitted to [facility] for an acute exacerbation, from which she has recovered. Pulmonary function tests from 2011 demonstrated an FEV1/FVC ratio of 37 and an FEV1 of 47%, making this stage III and severe. At home she takes tiotropium and budesonide-formoterol as well as levalbuterol MDI as needed. She has recently finished a course of doxycycline and she was still taking prednisone 5mg  daily for the exacerbation. Although she does report dyspnea, she does not have a worsened cough or purulent sputum, so I do not believe she is having an acute exacerbation of COPD. She may still be recovering from the effects of the previous exacerbation though, leading to the subjective feelings of fatigue and dyspnea. We treated her her with ipratropium and levalbuterol scheduled and her usual budesonide-formoterol scheduled, but we will not continue treatment with prednisone or antibiotics. She has recently quit smoking (1 week ago) after a very long smoking history.   2.   Chronic diastolic congestive heart failure:   Echocardiogram from April demonstrated a normal systolic function (EF 55 to 60) and normal diastolic function, yet she is followed by Dr. Myrtis Ser who, in his last note from August, reports CHF with left ventricular diastolic dysfunction, NYHA class 2. Although clinically she appears euvolemic, clear lungs and absent peripheral edema, a proBNP was elevated to 1239 on 10/19 up from 334 earlier in October. She responded to IV furosemide and was restarted on her home 40mg  furosemide oral daily.   3.   Metabolic alkalosis:  Her acid-base status, based on the ABG at admission, was a primary metabolic alkalosis with full repiratory compensation. The most likely cause of this was a contraction alkalosis from dehydration and furosemide use. She had very poor oral intake after leaving the hospital and was not drinking much. She continued using furosemide daily. We carefully hydrated her initially with IVF and she was subjectively feeling better after this, but because slightly hypervolemic (elevated proBNP), but I believe now she is closer to euvolemic.  4.   Hyponatremia:  Hypovolemic vs. hypervolemic hyponatremia. It is difficult to assess because she was taking furosemide as an outpatient while not eating and drinking and presented with a contraction alkalosis. With hydration, her Na trended down to 129 from 134 on admission. Urine Osm 316, Urine K 23, Urine Na 86, proBNP: 1239 which is elevated from 10/12 which was 334 , TSH: 0.943 normal, AM cortisol: 11.1. Her sodium improved after IV lasix yesterday which is consistent with CHF picture. Will restarted her home PO lasix at 40mg  qd.  5.   Hypokalemia:  Potassium, initially normal, dropped to 3.2 after IV furosemide.  oral replacement given yesterday. She may need regular replacement while taking furosemide.  I was normal after this, on the day of discharge, at 3.9.  6.   Hypertension:  She was recently swtiched from lisinopril to losartan. She also takes  bisoprolol. Her blood pressure has been well controlled over the past 24 hours prior to discharge (138-124 / 99-53).   7.   Paroxysmal atrial fibrillation: She is currently in sinus rhythm. We kept her on her home medications of diltiazem.  8.   Coronary artery disease:  History of a five vessel CABG in 2005. Currently no chest pain and an unchanged EKG that is consistent with prior inferior infarction.   We kept her on her home medicines.   DISCHARGE DATA: Vital Signs: BP 139/61  Pulse 60  Temp 97.7 F (36.5 C) (Oral)  Resp 17  Ht 5\' 9"  (1.753 m)  Wt 170 lb (77.111 kg)  BMI 25.10 kg/m2  SpO2 96%  Labs: Results for orders placed during the hospital encounter of 02/11/12 (from the past 24 hour(s))  BASIC METABOLIC PANEL     Status: Abnormal   Collection Time   02/15/12  9:17 AM      Component Value Range   Sodium 133 (*) 135 - 145 mEq/L   Potassium 3.9  3.5 - 5.1 mEq/L   Chloride 95 (*) 96 - 112 mEq/L   CO2 29  19 - 32 mEq/L   Glucose, Bld 179 (*) 70 - 99 mg/dL   BUN 11  6 - 23 mg/dL   Creatinine, Ser 2.13  0.50 - 1.10 mg/dL   Calcium 9.8  8.4 - 08.6 mg/dL   GFR calc non Af Amer 64 (*) >90 mL/min   GFR calc Af Amer 74 (*) >90 mL/min     Time spent on discharge: 32 minutes   Signed by:  Dorthula Rue. Earlene Plater, MD PGY-I, Internal Medicine  02/15/2012, 1:47 PM

## 2012-02-15 NOTE — Progress Notes (Signed)
Internal Medicine Teaching Service Attending Note Date: 02/15/2012  Patient name: [name]  Medical record number: [id number]  Date of birth: Jul 07, 1929   I have seen and evaluated [name] and discussed their care with the Residency Team.   Without complaints    . antiseptic oral rinse  15 mL Mouth Rinse BID  . aspirin EC  81 mg Oral Daily  . atorvastatin  10 mg Oral QHS  . bisoprolol  10 mg Oral Daily  . budesonide-formoterol  2 puff Inhalation BID  . diltiazem  240 mg Oral Daily  . enoxaparin (LOVENOX) injection  40 mg Subcutaneous Q24H  . furosemide  40 mg Oral Daily  . ipratropium  0.5 mg Nebulization Q6H  . levalbuterol  0.63 mg Nebulization Q6H  . losartan  50 mg Oral Daily  . pantoprazole  40 mg Oral Daily  . potassium chloride  40 mEq Oral BID  . sennosides-docusate sodium  1 tablet Oral BID  . sodium chloride  3 mL Intravenous Q12H   Anti-infectives    None       Physical Exam: Blood pressure 139/61, pulse 60, temperature 97.7 F (36.5 C), temperature source Oral, resp. rate 17, height 5\' 9"  (1.753 m), weight 77.111 kg (170 lb), SpO2 96.00%. General appearance: alert, appears stated age and no distress Back: excoriations on L upper back. no blistering.  Resp: diminished breath sounds bilaterally Cardio: regular rate and rhythm GI: normal findings: bowel sounds normal and soft, non-tender  Lab results: No results found for this or any previous visit (from the past 24 hour(s)).  Imaging results:  Dg Chest Port 1 View  02/13/2012  *RADIOLOGY REPORT*  Clinical Data: Shortness of breath  PORTABLE CHEST - 1 VIEW  Comparison: 02/11/2012; 10/12/201305/10/2010; 08/20/2007; chest CT - 09/25/2010  Findings: Grossly unchanged cardiac silhouette and mediastinal contours post median sternotomy and CABG.  The lungs remain hyperexpanded with flattening of bilateral hemidiaphragms. There is grossly unchanged pleural parenchymal thickening within the medial aspect  the right lung apex, grossly unchanged since the 07/2007 examination.  No new focal parenchymal opacities.  No pleural effusion or pneumothorax.  Grossly unchanged bones.  IMPRESSION: Hyperexpanded lungs without acute cardiopulmonary disease.   Original Report Authenticated By: Waynard Reeds, M.D.     Assessment and Plan: I agree with the formulated Assessment and Plan with the following changes:   End stage COPD Off steroids, off anbx Back on to home lasix dose Pending SNF placement

## 2012-02-15 NOTE — Progress Notes (Signed)
Patient discharged to [facility].  Report called to RN at [facility].  Patient aware of discharge, and telephoned family.  Transported by EMS, vital signs stable, O2 2l on.  No complaints of pain.

## 2012-02-15 NOTE — Progress Notes (Signed)
Physical Therapy Treatment Patient Details Name: [name] MRN: [id number] DOB: 10-16-1929 Today's Date: 02/15/2012 Time: 2130-8657 PT Time Calculation (min): 26 min  PT Assessment / Plan / Recommendation Comments on Treatment Session  Pt able to increase ambulation distance.  Pt states that since the increase in oxygen she has not been as SOB during exercise.      Follow Up Recommendations  Post acute inpatient     Does the patient have the potential to tolerate intense rehabilitation  No, Recommend SNF  Barriers to Discharge        Equipment Recommendations  None recommended by PT    Recommendations for Other Services OT consult  Frequency Min 3X/week   Plan Discharge plan needs to be updated    Precautions / Restrictions Precautions Precautions: Fall Restrictions Weight Bearing Restrictions: No   Pertinent Vitals/Pain Pain not reported just weakness in bilateral LE.    Mobility  Bed Mobility Bed Mobility: Not assessed Transfers Transfers: Sit to Stand;Stand to Sit Sit to Stand: 5: Supervision;With upper extremity assist;From chair/3-in-1 Stand to Sit: 5: Supervision;With upper extremity assist;To chair/3-in-1 Details for Transfer Assistance: Supervision for safety due to weakness in bilateral LE. Ambulation/Gait Ambulation/Gait Assistance: 5: Supervision Ambulation Distance (Feet): 150 Feet Assistive device: Rolling walker Ambulation/Gait Assistance Details: Supervision for safety due to weakness in bilateral LE. Gait Pattern: Step-through pattern;Decreased stride length Gait velocity: decreased Stairs: No Wheelchair Mobility Wheelchair Mobility: No    Exercises     PT Diagnosis:    PT Problem List:   PT Treatment Interventions:     PT Goals Acute Rehab PT Goals PT Goal Formulation: With patient Time For Goal Achievement: 02/19/12 Potential to Achieve Goals: Good Pt will go Sit to Stand: with modified independence;with upper extremity assist PT  Goal: Sit to Stand - Progress: Progressing toward goal Pt will go Stand to Sit: with modified independence;with upper extremity assist PT Goal: Stand to Sit - Progress: Progressing toward goal Pt will Ambulate: >150 feet;with modified independence;with least restrictive assistive device PT Goal: Ambulate - Progress: Partly met  Visit Information  Last PT Received On: 02/15/12 Assistance Needed: +1    Subjective Data  Subjective: I don't know if the doctors told you but they have increased my oxygen to 3 during activity.. Patient Stated Goal: To be d/c to snf.   Cognition  Overall Cognitive Status: Appears within functional limits for tasks assessed/performed Arousal/Alertness: Awake/alert Orientation Level: Appears intact for tasks assessed Behavior During Session: [facility] for tasks performed    Balance  Balance Balance Assessed: No  End of Session PT - End of Session Equipment Utilized During Treatment: Gait belt;Oxygen Activity Tolerance: Patient tolerated treatment well Patient left: in chair;with call bell/phone within reach Nurse Communication: Mobility status   GP     Jaydi Bray 02/15/2012, 3:10 PM

## 2012-02-15 NOTE — Progress Notes (Addendum)
Subjective:    Interval Events:  Patient feels quite short of breath with any exertion and says her oxygen saturation dropped to 87% when she ambulated to the bathroom earlier on 2L of nasal [place] oxygen.  She feels like something is "caught up" in her upper airways that she cannot clear, but her cough is not worsening and it is still productive of whittish sputum.    Objective:    Vital Signs:   Temp:  [97.5 F (36.4 C)-97.7 F (36.5 C)] 97.7 F (36.5 C) (10/21 0517) Pulse Rate:  [60-65] 60  (10/21 0517) Resp:  [16-17] 17  (10/21 0517) BP: (124-139)/(53-99) 139/61 mmHg (10/21 0517) SpO2:  [88 %-99 %] 99 % (10/21 0517) Weight:  [170 lb (77.111 kg)] 170 lb (77.111 kg) (10/20 1500) Last BM Date: 02/13/12   Weights: 24-hour Weight change: -2 lb 2.9 oz (-0.989 kg)  Filed Weights   02/13/12 0554 02/14/12 0634 02/14/12 1500  Weight: 171 lb 8.3 oz (77.8 kg) 172 lb 2.9 oz (78.1 kg) 170 lb (77.111 kg)   Net since admission:  -0.8kg   Intake/Output:   Intake/Output Summary (Last 24 hours) at 02/15/12 1610 Last data filed at 02/15/12 0518  Gross per 24 hour  Intake    420 ml  Output   2200 ml  Net  -1780 ml     Net since admission:  -0.4L   Physical Exam: GENERAL:  alert and oriented; resting comfortably in bed and in no distress EYES:  pupils equal, round, and reactive to light; sclera anicteric ENT:  moist mucosa LUNGS:  breath sounds are diminished but clear bilaterally, normal work of breathing HEART:  normal rate; regular rhythm; normal S1 and S2, no S3 or S4 appreciated; no murmurs, rubs, or clicks EXTREMITIES:  no edema SKIN:  normal turgor    Labs:  none  Other results:  none    Medications:    Infusions:     Scheduled Medications:    . antiseptic oral rinse  15 mL Mouth Rinse BID  . aspirin EC  81 mg Oral Daily  . atorvastatin  10 mg Oral QHS  . bisoprolol  10 mg Oral Daily  . budesonide-formoterol  2 puff Inhalation BID  . diltiazem  240 mg  Oral Daily  . enoxaparin (LOVENOX) injection  40 mg Subcutaneous Q24H  . furosemide  40 mg Oral Daily  . ipratropium  0.5 mg Nebulization Q6H  . levalbuterol  0.63 mg Nebulization Q6H  . losartan  50 mg Oral Daily  . pantoprazole  40 mg Oral Daily  . potassium chloride  40 mEq Oral BID  . sennosides-docusate sodium  1 tablet Oral BID  . sodium chloride  3 mL Intravenous Q12H     PRN Medications: feeding supplement, levalbuterol, nitroGLYCERIN, ondansetron (ZOFRAN) IV, ondansetron, selenium sulfide, sodium chloride    Assessment/ Plan:    1.   Chronic obstructive pulmonary disease, stage III:  She was recently admitted to [facility] for an acute exacerbation, from which she has recovered. Pulmonary function tests from 2011 demonstrated an FEV1/FVC ratio of 37 and an FEV1 of 47%, making this stage III and severe. At home she takes tiotropium and budesonide-formoterol as well as levalbuterol MDI as needed. She has recently finished a course of doxycycline and she was still taking prednisone 5mg  daily for the exacerbation. Although she does report dyspnea, she does not have a worsened cough or purulent sputum, so I do not believe she is having an acute  exacerbation of COPD. She may still be recovering from the effects of the previous exacerbation though, leading to the subjective feelings of fatigue and dyspnea. We will treat her her with ipratropium and levalbuterol as needed and her usual tiotropium and budesonide-formoterol scheduled, but we will not continue treatment with prednisone or antibiotics. She has recently quit smoking (1 week ago) after a very long smoking history.  - ipratropium every six hours - levalbuterol every six hours and as needed - budesonide-formoterol 2 puffs BID   2.   Chronic diastolic congestive heart failure:  Echocardiogram from April demonstrated a normal systolic function (EF 55 to 60) and normal diastolic function, yet she is followed by Dr. Myrtis Ser who, in his last note  from August, reports CHF with levt ventricular diastolic dysfunction, NYHA class 2.  Although clinically she appears euvolemic, clear lungs and absent peripheral edema, a proBNP was elevated to 1239 on 10/19 up from 334 earlier in October.  She responded to IV furosemide and was restarted on her home 40mg  furosemide oral daily. - continue furosemide 40mg  PO daily  3.   Metabolic alkalosis:  Her acid-base status, based on the ABG at admission, was a primary metabolic alkalosis with full repiratory compensation. The most likely cause of this was a contraction alkalosis from dehydration and furosemide use. She had very poor oral intake after leaving the hospital and was not drinking much. She continued using furosemide daily. We carefully hydrated her initially with IVF and she was subjectively feeling better after this, but because slightly hypervolemic (elevated proBNP), but I believe now she is closer to euvolemic.  4.   Hyponatremia:  Hypovolemic vs. hypervolemic hyponatremia. It is difficult to assess because she was taking furosemide as an outpatient while not eating and drinking and presented with a contraction alkalosis. With hydration, her Na trended down to 129 from 134 on admission. Urine Osm 316, Urine K 23, Urine Na 86, proBNP: 1239 which is elevated from 10/12 which was 334 , TSH: 0.943 normal, AM cortisol: 11.1. Her sodium improved after IV lasix yesterday which is consistent with CHF picture. Will resume her home PO lasix at 40mg  qd.  5.   Hypokalemia:  Potassium, initially normal, dropped to 3.2 after IV furosemide.  oral replacement given yesterday.  She may need regular replacement while taking furosemide. - BMET today  6.   Hypertension:  She was recently swtiched from lisinopril to losartan. She also takes bisoprolol. Her blood pressure has been well controlled over the past 24 hours (138-124 / 99-53). - losartan 50mg  daily  - bisoprolol 10mg  daily   7.   Paroxysmal atrial  fibrillation:  She is currently in sinus rhythm. We will keep her on her home medications of diltiazem,  - diltiazem 240mg  daily  8.   Coronary artery disease:  History of a five vessel CABG in 2005. Currently no chest pain and an unchanged EKG that is consistent with prior inferior infarction.  - sublingual nitroglycerin PRN  - aspirin 81mg  daily  - atorvastatin 10mg  qHS  9.   Prophylaxis:  - enoxaparin 40mg  [place] daily for VTE prophylaxis  - senna-docusate 100mg  BID for bowel regimen  - pantoprazole 40mg  daily for peptic ulcer prophylaxis  10. Disposition:  Ready for discharge to SNF; pending placement    Length of Stay: 4 days   Signed by:  Dorthula Rue. Earlene Plater, MD PGY-I, Internal Medicine Pager [phone]  02/15/2012, 10:12 AM

## 2012-02-15 NOTE — Progress Notes (Signed)
Clinical Social Work-CSW confirmed bed at [facility] and notified family-CSW assembled d/c packet and will facilitate transport as soon as FL2 signed-No further needs- Jodean Lima, [phone]

## 2012-02-15 NOTE — Progress Notes (Signed)
Clinical Social Work Department BRIEF PSYCHOSOCIAL ASSESSMENT 02/15/2012  Patient:  JOLEA, DOLLE     Account Number:  [id number]     Admit date:  02/11/2012  Clinical Social Worker:  Conley Simmonds  Date/Time:  02/15/2012 12:00 N  Referred by:  Physician  Date Referred:  02/15/2012 Referred for  SNF Placement   Other Referral:   Interview type:  Family Other interview type:    PSYCHOSOCIAL DATA Living Status:  ALONE Admitted from facility:   Level of care:   Primary support name:  [name] and [name] Primary support relationship to patient:  CHILD, ADULT Degree of support available:   Strong    CURRENT CONCERNS Current Concerns  Post-Acute Placement   Other Concerns:    SOCIAL WORK ASSESSMENT / PLAN CSW spoke with pt son by phone after pt/MD requested call-Pt son confirms that pt would like to d/c to SNF for ST Rehab-  Pt son and dtr in law live next door to patient and long term the plan is for her to d/c home-  Pt has been to [facility] in the past and this is first choice-  CSW has initiated FL2 and bed search and will facilitate d/c today   Assessment/plan status:  Psychosocial Support/Ongoing Assessment of Needs Other assessment/ plan:   Information/referral to community resources:   ST SNF    PATIENT'S/FAMILY'S RESPONSE TO PLAN OF CARE: Pt son very understanding and reasonable with regards to d/c planning-pt son requests [facility] as it is the closest SNF to home and pt has been to SNF in past-  CSW in process of obtaining offer from [facility]-  CSW will contact son with bed offers and facilitate d/c planning--  Jodean Lima, [phone]

## 2012-02-15 NOTE — Progress Notes (Signed)
Agree with PT treatment note.  Sabryn Preslar, PT DPT [phone]  

## 2012-02-16 ENCOUNTER — Ambulatory Visit: Payer: Medicare Other | Admitting: Adult Health

## 2012-02-16 ENCOUNTER — Encounter (HOSPITAL_COMMUNITY): Payer: Medicare Other

## 2012-02-18 ENCOUNTER — Ambulatory Visit (INDEPENDENT_AMBULATORY_CARE_PROVIDER_SITE_OTHER): Payer: Medicare Other | Admitting: Adult Health

## 2012-02-18 ENCOUNTER — Encounter: Payer: Self-pay | Admitting: Adult Health

## 2012-02-18 ENCOUNTER — Encounter (HOSPITAL_COMMUNITY): Payer: Medicare Other

## 2012-02-18 VITALS — BP 132/80 | HR 55 | Temp 97.0°F | Ht 71.5 in | Wt 174.0 lb

## 2012-02-18 DIAGNOSIS — J449 Chronic obstructive pulmonary disease, unspecified: Secondary | ICD-10-CM

## 2012-02-18 NOTE — Patient Instructions (Addendum)
Continue on Symbicort and Spiriva .  Wear Oxygen on continuous flow setting at 2 l/m  At rest , 3 L/m with activity  We are very proud of you for quitting smoking.  Please contact office for sooner follow up if symptoms do not improve or worsen or seek emergency care  follow up Dr. Vassie Loll  In 3-4 weeks as planned and As needed

## 2012-02-18 NOTE — Progress Notes (Signed)
Subjective:    Patient ID: [name], female    DOB: 10-20-1929, 78 y.o.   MRN: [id number]  HPI [name] , smoker presents for FU of COPD.  She has been on home O2 since dec 2010 ([facility]) after a chest cold requiring prednisone, CXR then showed hyperinflation. She smokes 1/2 PPD, about 64 Pyrs - chantix made her sick & she is afraid of cardiac side effects with patches. She sees Dr Myrtis Ser for CAD & has tolerated lisinopril & metoprolol, echo 7/10 showed nml LV fn & non dilated RV. An episode of syncope in 2009 was attributed to NTG & diuretics.  She reports clear phlegm, nasal drip & sneezing - no seasonal allergies, nasal atrovent dries her out.  She has lt >> Rt pedal edema  Desaturated to 86% onRA with minimal ambulation.  reviewed PFTs 08/19/09 >> severe airway obstruction, FEV1 47%, no BD response, air trapping +, severe decrease in diffusion  CT angio 5/12 asc aortic aneurysm 41mm, hepatic cysts    7/1 /2013 17m FU Hosp 3/31- 08/04/11 for Newly recognized paroxysmal atrial fibrillation with RVR and COPD exacerbation - not felt to be a coumadin candidate secondary to hx of GIB/AVMs  Smoked a few since dc Ventolin Changed to xopenex MDI - not sure this works as well, uses 4-5 /d CXR 07/29/11 no infx  compliant with O2 , helpful  >>no changes   02/08/2012 [facility]  Patient presents for a post hospital followup. Patient was admitted October 12 of October 13 for COPD, exacerbation. Chest x-ray showed no acute changes. Patient was treated with aggressive pulmonary hygiene regimen. Along with IV steroids She was discharged on a steroid taper-now finished today.  Since discharge. Patient is feeling weak, no energy Says she is going to quit smoking., advised on smoking cessation and dangers of O2 and smoking.  No hemoptysis, chest pain, or edema.  >>Stop ACE >rx Losartan   02/18/2012 Post Hospital follow up  Patient returns for a  post hospital followup. She was admitted October 17  -October 20 for slow to improve COPD, exacerbation, and possible decompensated chronic diastolic congestive heart failure in the setting of poor oral intake Patient had been recently admitted October 12 for COPD, exacerbation, in which she was treated with antibiotics, and a steroid burst She continued to have ongoing smoking despite multiple attempts of smoking cessation She was changed from her ACE inhibitor over to losartan in hopes to decrease her recurrent cough and wheezing. During her hospitalization. She was treated with aggressive pulmonary hygiene, and given gentle diuresis for an elevated BNP.  Discharged to [facility]. Undergoing PT   Since discharge. Patient is feeling much better. NO smoking.  Cough /congestion is decreased.  Dyspnea is better.  Still weak and has hard time walking without assistance.  No hemoptysis , edema. No n/v.    Review of Systems  Constitutional:   No  weight loss, night sweats,  Fevers, chills, +fatigue, or  lassitude.  HEENT:   No headaches,  Difficulty swallowing,  Tooth/dental problems, or  Sore throat,                No sneezing, itching, ear ache, nasal congestion, post nasal drip,   CV:  No chest pain,  Orthopnea, PND, swelling in lower extremities, anasarca, dizziness, palpitations, syncope.   GI  No heartburn, indigestion, abdominal pain, nausea, vomiting, diarrhea, change in bowel habits, loss of appetite, bloody stools.   Resp:   No coughing up of  blood.   No chest wall deformity  Skin: no rash or lesions.  GU: no dysuria, change in color of urine, no urgency or frequency.  No flank pain, no hematuria   MS:  No joint pain or swelling.  No decreased range of motion.    Psych:  No change in mood or affect. No depression or anxiety.  No memory loss.         Objective:   Physical Exam   Gen. Pleasant, thin, on O2, in no distress ENT - no lesions, no post nasal drip Neck: No JVD, no thyromegaly, no carotid bruits Lungs: no  use of accessory muscles, no dullness to percussion, Diminished BS in base , no wheezing  Cardiovascular: Rhythm regular, heart sounds  normal, no murmurs or gallops, no peripheral edema Musculoskeletal: No deformities, no cyanosis or clubbing        Assessment & Plan:

## 2012-02-19 NOTE — Assessment & Plan Note (Addendum)
Exacerbation now resolving   Plan continue on Symbicort and Spiriva .  Wear Oxygen on continuous flow setting at 2 l/m  At rest , 3 L/m with activity  We are very proud of you for quitting smoking.  Please contact office for sooner follow up if symptoms do not improve or worsen or seek emergency care  follow up Dr. Vassie Loll  In 3-4 weeks as planned and As needed

## 2012-02-23 ENCOUNTER — Encounter (HOSPITAL_COMMUNITY): Payer: Medicare Other

## 2012-02-25 ENCOUNTER — Encounter (HOSPITAL_COMMUNITY): Admission: RE | Admit: 2012-02-25 | Payer: Medicare Other | Source: Ambulatory Visit

## 2012-03-01 ENCOUNTER — Encounter (HOSPITAL_COMMUNITY): Payer: Medicare Other

## 2012-03-02 ENCOUNTER — Ambulatory Visit: Payer: Medicare Other | Admitting: Cardiology

## 2012-03-03 ENCOUNTER — Encounter (HOSPITAL_COMMUNITY): Payer: Medicare Other

## 2012-03-08 ENCOUNTER — Encounter (HOSPITAL_COMMUNITY): Payer: Medicare Other

## 2012-03-10 ENCOUNTER — Encounter (HOSPITAL_COMMUNITY): Payer: Medicare Other

## 2012-03-15 ENCOUNTER — Encounter (HOSPITAL_COMMUNITY): Payer: Medicare Other

## 2012-03-16 ENCOUNTER — Encounter: Payer: Self-pay | Admitting: Pulmonary Disease

## 2012-03-16 ENCOUNTER — Ambulatory Visit (INDEPENDENT_AMBULATORY_CARE_PROVIDER_SITE_OTHER): Payer: Medicare Other | Admitting: Pulmonary Disease

## 2012-03-16 VITALS — BP 122/68 | HR 57 | Temp 97.3°F | Ht 71.5 in | Wt 179.4 lb

## 2012-03-16 DIAGNOSIS — J449 Chronic obstructive pulmonary disease, unspecified: Secondary | ICD-10-CM

## 2012-03-16 DIAGNOSIS — Z9981 Dependence on supplemental oxygen: Secondary | ICD-10-CM

## 2012-03-16 MED ORDER — LOSARTAN POTASSIUM 50 MG PO TABS: 50.0000 mg | ORAL_TABLET | Freq: Every day | ORAL | Status: DC

## 2012-03-16 MED ORDER — ALBUTEROL SULFATE HFA 108 (90 BASE) MCG/ACT IN AERS: 2.0000 | INHALATION_SPRAY | Freq: Four times a day (QID) | RESPIRATORY_TRACT | Status: DC | PRN

## 2012-03-16 NOTE — Patient Instructions (Addendum)
Refills for losartan & pro-air [facility] sent Congratulations on quitting smoking

## 2012-03-16 NOTE — Progress Notes (Signed)
Subjective:    Patient ID: [name], female    DOB: [birth year], 78 y.o.   MRN: [id number]  HPI [name] , smoker presents for FU of COPD.  She has been on home O2 since dec 2010 ([facility]) after a chest cold requiring prednisone, CXR then showed hyperinflation. She smokes 1/2 PPD, about 55 Pyrs - chantix made her sick & she is afraid of cardiac side effects with patches. She sees Dr Myrtis Ser for CAD & has tolerated lisinopril & metoprolol, echo 7/10 showed nml LV fn & non dilated RV. An episode of syncope in 2009 was attributed to NTG & diuretics.  She reports clear phlegm, nasal drip & sneezing - no seasonal allergies, nasal atrovent dries her out.  She has lt >> Rt pedal edema  Desaturated to 86% onRA with minimal ambulation.  reviewed PFTs 08/19/09 >> severe airway obstruction, FEV1 47%, no BD response, air trapping +, severe decrease in diffusion  CT angio 5/12 asc aortic aneurysm 41mm, hepatic cysts  7/1 /2013 41m FU  Hosp 3/31- 08/04/11 for Newly recognized paroxysmal atrial fibrillation with RVR and COPD exacerbation  - not felt to be a coumadin candidate secondary to hx of GIB/AVMs    03/17/2012  She was re- admitted October 17 -October 20 for slow to improve COPD, exacerbation, and possible decompensated chronic diastolic congestive heart failure in the setting of poor oral intake  Patient had been recently admitted October 12 for COPD, exacerbation, in which she was treated with antibiotics, and a steroid burst  She continued to have ongoing smoking despite multiple attempts of smoking cessation  She was changed from her ACE inhibitor over to losartan in hopes to decrease her recurrent cough and wheezing.  During her hospitalization she was treated given gentle diuresis for an elevated BNP.  Discharged to [facility]. Undergoing PT  Since discharge. Patient is feeling much better. NO smoking.  Cough /congestion is decreased.  Dyspnea is better.  Still weak and has hard time walking  without assistance.  No hemoptysis , edema. No n/v.  breathing is worse x yesterday, dry cough, sneezing. no wheezing, chest tx. uses 2 liters at rest/sleep and 3 liters w/ exertion She needs losartan refilled & also insurance requests xopenex change to albuterol  Past Medical History  Diagnosis Date  . CAD (coronary artery disease)     s/p CABG x5 (2005)   . Hypertension   . Hyperlipidemia   . GI bleed     AVMs  . Aneurysm, aortic     thoracic aorta, stable at 4.1 cm, chest CT, May, 2012  . Syncope     Nitroglycerin plus a diuretic, April, 2009  . Hyponatremia     Chronic. Felt secondary to SIADH   . COPD (chronic obstructive pulmonary disease)   . Tobacco abuse   . Bradycardia   . Carotid artery disease     Hx of endarterectomy. Doppler October, 2011, stable, 0-39% RIC A., 40-59% LICA  . Tuberculosis     Exposures to tuberculosis 1970s, has tested negative by the health Department  . Atrial fibrillation     Paroxysmal with RVR 07/2011, not felt to be a coumadin candidate secondary to hx of GIB/AVM  . Venous stasis of lower extremity     Chronic  . Ejection fraction     EF 55-60%, echo, April, 2013  . CHF with left ventricular diastolic dysfunction, NYHA class 2     Diastolic dysfunction  . AAA (abdominal aortic aneurysm)   .  On home O2     2L N/C  . Complication of anesthesia   . Shortness of breath         Review of Systems neg for any significant sore throat, dysphagia, itching, sneezing, nasal congestion or excess/ purulent secretions, fever, chills, sweats, unintended wt loss, pleuritic or exertional cp, hempoptysis, orthopnea pnd or change in chronic leg swelling. Also denies presyncope, palpitations, heartburn, abdominal pain, nausea, vomiting, diarrhea or change in bowel or urinary habits, dysuria,hematuria, rash, arthralgias, visual complaints, headache, numbness weakness or ataxia.     Objective:   Physical Exam  Gen. Pleasant, well-nourished, in no  distress, on O2 ENT - no lesions, no post nasal drip Neck: No JVD, no thyromegaly, no carotid bruits Lungs: no use of accessory muscles, no dullness to percussion, decreased without rales or rhonchi  Cardiovascular: Rhythm regular, heart sounds  normal, no murmurs or gallops, no peripheral edema Musculoskeletal: No deformities, no cyanosis or clubbing         Assessment & Plan:

## 2012-03-17 ENCOUNTER — Encounter (HOSPITAL_COMMUNITY): Payer: Medicare Other

## 2012-03-17 NOTE — Assessment & Plan Note (Signed)
Refills for losartan & pro-air [facility] sent Congratulations on quitting smoking - remains at high risk for relapse Ok to resume pulm rehab

## 2012-03-17 NOTE — Assessment & Plan Note (Signed)
Ct 2L at rest & 3L on exertion Reassess next visit

## 2012-03-22 ENCOUNTER — Encounter (HOSPITAL_COMMUNITY): Payer: Medicare Other

## 2012-03-24 ENCOUNTER — Encounter (HOSPITAL_COMMUNITY): Payer: Medicare Other

## 2012-03-29 ENCOUNTER — Encounter (HOSPITAL_COMMUNITY): Payer: Medicare Other

## 2012-03-31 ENCOUNTER — Encounter (HOSPITAL_COMMUNITY): Payer: Medicare Other

## 2012-04-05 ENCOUNTER — Encounter: Payer: Self-pay | Admitting: Cardiology

## 2012-04-05 ENCOUNTER — Ambulatory Visit: Payer: Medicare Other | Admitting: Cardiology

## 2012-04-05 ENCOUNTER — Ambulatory Visit (INDEPENDENT_AMBULATORY_CARE_PROVIDER_SITE_OTHER): Payer: Medicare Other | Admitting: Cardiology

## 2012-04-05 ENCOUNTER — Encounter (HOSPITAL_COMMUNITY): Admission: RE | Admit: 2012-04-05 | Payer: Medicare Other | Source: Ambulatory Visit

## 2012-04-05 VITALS — BP 130/60 | HR 58 | Ht 71.0 in | Wt 181.8 lb

## 2012-04-05 DIAGNOSIS — I10 Essential (primary) hypertension: Secondary | ICD-10-CM

## 2012-04-05 DIAGNOSIS — I5030 Unspecified diastolic (congestive) heart failure: Secondary | ICD-10-CM

## 2012-04-05 DIAGNOSIS — J9610 Chronic respiratory failure, unspecified whether with hypoxia or hypercapnia: Secondary | ICD-10-CM

## 2012-04-05 DIAGNOSIS — I509 Heart failure, unspecified: Secondary | ICD-10-CM

## 2012-04-05 MED ORDER — FUROSEMIDE 40 MG PO TABS: 40.0000 mg | ORAL_TABLET | Freq: Every day | ORAL | Status: DC

## 2012-04-05 MED ORDER — ATORVASTATIN CALCIUM 10 MG PO TABS: 10.0000 mg | ORAL_TABLET | Freq: Every day | ORAL | Status: DC

## 2012-04-05 MED ORDER — DILTIAZEM HCL ER COATED BEADS 240 MG PO CP24: 240.0000 mg | ORAL_CAPSULE | Freq: Every day | ORAL | Status: DC

## 2012-04-05 MED ORDER — BISOPROLOL FUMARATE 10 MG PO TABS: 10.0000 mg | ORAL_TABLET | Freq: Every day | ORAL | Status: DC

## 2012-04-05 MED ORDER — LOSARTAN POTASSIUM 50 MG PO TABS: 50.0000 mg | ORAL_TABLET | Freq: Every day | ORAL | Status: DC

## 2012-04-05 NOTE — Patient Instructions (Addendum)
Your physician recommends that you continue on your current medications as directed. Please refer to the Current Medication list given to you today.  Your physician recommends that you schedule a follow-up appointment in: 3 months.    Your physician recommends that you return for lab work in: today (bmet)

## 2012-04-05 NOTE — Progress Notes (Signed)
HPI  Patient is seen to followup diastolic heart failure. I saw her last August, 2013. She has been admitted to the hospital on 2 occasions since then. Both times it was related to COPD exacerbation. It is my understanding that there may have been a volume component to 1 hospitalizations. After her hospitalization she was kept off Lasix for a period of time and she began to have edema. Her Lasix was restarted in her edema is improved. She actually feels reasonably well today.  Her lisinopril was changed to losartan.  Allergies  Allergen Reactions  . Codeine Other (See Comments)    unknown  . Ranitidine Hcl Other (See Comments)    unknown  . Sulfonamide Derivatives Nausea And Vomiting  . Penicillins Rash and Other (See Comments)    unknown    Current Outpatient Prescriptions  Medication Sig Dispense Refill  . aspirin 81 MG tablet Take 81 mg by mouth daily.        Marland Kitchen atorvastatin (LIPITOR) 10 MG tablet Take 1 tablet (10 mg total) by mouth at bedtime.  90 tablet  4  . bisoprolol (ZEBETA) 10 MG tablet Take 1 tablet (10 mg total) by mouth daily.  90 tablet  4  . budesonide-formoterol (SYMBICORT) 160-4.5 MCG/ACT inhaler Inhale 2 puffs into the lungs 2 (two) times daily.      Marland Kitchen diltiazem (CARTIA XT) 240 MG 24 hr capsule Take 1 capsule (240 mg total) by mouth daily.  30 capsule  5  . docusate sodium (COLACE) 100 MG capsule Take 100 mg by mouth 2 (two) times daily.        . ergocalciferol (VITAMIN D2) 50000 UNITS capsule 1 capsule by mouth every other week      . furosemide (LASIX) 40 MG tablet Take 1 tablet (40 mg total) by mouth daily.  30 tablet    . IPRATROPIUM BROMIDE NA Place 2 sprays into the nose as needed. For allergies      . losartan (COZAAR) 50 MG tablet Take 1 tablet (50 mg total) by mouth daily.  90 tablet  0  . nitroGLYCERIN (NITROSTAT) 0.4 MG SL tablet Place 0.4 mg under the tongue every 5 (five) minutes x 3 doses as needed. Chest pain      . pantoprazole (PROTONIX) 40 MG  tablet Take 1 tablet (40 mg total) by mouth daily.  90 tablet  3  . potassium chloride SA (K-DUR,KLOR-CON) 20 MEQ tablet Take 20 mEq by mouth 2 (two) times daily.      Marland Kitchen selenium sulfide (SELSUN) 2.5 % shampoo Apply 1 application topically daily as needed. For dermatitis      . sennosides-docusate sodium (SENOKOT-S) 8.6-50 MG tablet Take 1 tablet by mouth daily.      Marland Kitchen tiotropium (SPIRIVA) 18 MCG inhalation capsule Place 18 mcg into inhaler and inhale daily.        Marland Kitchen albuterol (PROAIR HFA) 108 (90 BASE) MCG/ACT inhaler Inhale 2 puffs into the lungs every 6 (six) hours as needed.  3 Inhaler  1    History   Social History  . Marital Status: Widowed    Spouse Name: N/A    Number of Children: 1  . Years of Education: N/A   Occupational History  . retired     [employer]   Social History Main Topics  . Smoking status: Former Smoker -- 0.5 packs/day for 59 years    Types: Cigarettes    Quit date: 02/04/2012  . Smokeless tobacco: Not on file  .  Alcohol Use: No  . Drug Use: Not on file  . Sexually Active: Not on file   Other Topics Concern  . Not on file   Social History Narrative  . No narrative on file    Family History  Problem Relation Age of Onset  . Stroke Sister   . Heart failure Mother   . Cancer Sister     Breast and Lung   . Cancer Brother     breast cancer    Past Medical History  Diagnosis Date  . CAD (coronary artery disease)     s/p CABG x5 (2005)   . Hypertension   . Hyperlipidemia   . GI bleed     AVMs  . Aneurysm, aortic     thoracic aorta, stable at 4.1 cm, chest CT, May, 2012  . Syncope     Nitroglycerin plus a diuretic, April, 2009  . Hyponatremia     Chronic. Felt secondary to SIADH   . COPD (chronic obstructive pulmonary disease)   . Tobacco abuse   . Bradycardia   . Carotid artery disease     Hx of endarterectomy. Doppler October, 2011, stable, 0-39% RIC A., 40-59% LICA  . Tuberculosis     Exposures to tuberculosis 1970s, has tested negative  by the health Department  . Atrial fibrillation     Paroxysmal with RVR 07/2011, not felt to be a coumadin candidate secondary to hx of GIB/AVM  . Venous stasis of lower extremity     Chronic  . Ejection fraction     EF 55-60%, echo, April, 2013  . CHF with left ventricular diastolic dysfunction, NYHA class 2     Diastolic dysfunction  . AAA (abdominal aortic aneurysm)   . On home O2     2L N/C  . Complication of anesthesia   . Shortness of breath     Past Surgical History  Procedure Date  . Cholecystectomy   . Carotid endartercetomy   . Abdominal hysterectomy   . Coronary artery bypass graft 2005    Patient Active Problem List  Diagnosis  . OXYGEN-USE OF SUPPLEMENTAL  . CAD (coronary artery disease)  . Aortic valve sclerosis  . Hypertension  . Hyperlipidemia  . GI bleed  . Aneurysm, aortic  . Syncope  . Hyponatremia  . COPD (chronic obstructive pulmonary disease)  . Tobacco abuse  . Bradycardia  . Hx of CABG  . Ejection fraction  . Carotid artery disease  . Tuberculosis  . Edema  . Hypokalemia  . H/O steroid therapy  . Chronic respiratory failure  . CHF with left ventricular diastolic dysfunction, NYHA class 2  . Atrial fibrillation  . Generalized weakness  . Dehydration  . Alkalosis, metabolic  . Dyspnea  . Acute exacerbation of chronic obstructive pulmonary disease (COPD)    ROS   Patient denies fever, chills, headache, sweats, rash, change in vision, change in hearing, chest pain,  nausea vomiting, urinary symptoms.All other systems are reviewed and are negative.  PHYSICAL EXAM  Patient is oriented to person time and place. Affect is normal. She's wearing her oxygen. Lungs reveal decreased breath sounds. Cardiac exam reveals S1 and S2. There no clicks or significant murmurs. The abdomen is soft. There's no peripheral edema.  Filed Vitals:   04/05/12 1554  BP: 130/60  Pulse: 58  Height: 5\' 11"  (1.803 m)  Weight: 181 lb 12 oz (82.441 kg)  SpO2: 95%       ASSESSMENT & PLAN

## 2012-04-05 NOTE — Assessment & Plan Note (Signed)
Blood pressure had jumped up previously. Is under better control now.

## 2012-04-05 NOTE — Assessment & Plan Note (Signed)
Her volume status is stable today on Lasix. Chemistry will be checked.

## 2012-04-05 NOTE — Assessment & Plan Note (Signed)
Patient has severe disease. She is stable.

## 2012-04-06 ENCOUNTER — Encounter (INDEPENDENT_AMBULATORY_CARE_PROVIDER_SITE_OTHER): Payer: Medicare Other

## 2012-04-06 ENCOUNTER — Other Ambulatory Visit: Payer: Self-pay | Admitting: Cardiology

## 2012-04-06 DIAGNOSIS — I6529 Occlusion and stenosis of unspecified carotid artery: Secondary | ICD-10-CM

## 2012-04-06 LAB — BASIC METABOLIC PANEL
BUN: 16 mg/dL (ref 6–23)
CO2: 31 mEq/L (ref 19–32)
Chloride: 98 mEq/L (ref 96–112)
Creatinine, Ser: 1.1 mg/dL (ref 0.4–1.2)
Glucose, Bld: 98 mg/dL (ref 70–99)
Potassium: 4.1 mEq/L (ref 3.5–5.1)

## 2012-04-06 NOTE — Progress Notes (Signed)
PULMONARY REHAB OUTCOMES REPORT   Patient dropped due to need for another therapy for 4 weeks plus non compliance with attendance. No pre or post Quality of life paperwork turned in. Patient was given 2 copies for pre paperwork and never turned them in. 5 sessions completed and attending one education class. Patient did complete a pre 6 minute walk test and ambulated 220 ft stopping several times due to leg weakness and being very dyspneic. Patient has been told she has been dropped from program.   Thompson Grayer, MS, NASM, CES  Agree with the above note, the patient has been informed she may re-enroll when she is cleared by her MD.  Cathie Olden RN  There is some room for improvement for pt diet to become healthier.   Section Completed by: Mickle Plumb, M.Ed, RD, LDN, CDE

## 2012-04-07 ENCOUNTER — Encounter (HOSPITAL_COMMUNITY): Payer: Medicare Other

## 2012-04-12 ENCOUNTER — Encounter (HOSPITAL_COMMUNITY): Payer: Medicare Other

## 2012-04-14 ENCOUNTER — Encounter (HOSPITAL_COMMUNITY): Payer: Medicare Other

## 2012-04-18 ENCOUNTER — Encounter: Payer: Self-pay | Admitting: Cardiology

## 2012-04-19 ENCOUNTER — Encounter (HOSPITAL_COMMUNITY): Payer: Medicare Other

## 2012-04-21 ENCOUNTER — Encounter (HOSPITAL_COMMUNITY): Payer: Medicare Other

## 2012-04-22 ENCOUNTER — Telehealth: Payer: Self-pay | Admitting: Pulmonary Disease

## 2012-04-22 NOTE — Telephone Encounter (Signed)
She has filled Rx for both lisnopril (from dr Myrtis Ser office) & losartan (from [place]). Please ask her to stop lisinopril due to cough & wheeze

## 2012-04-25 NOTE — Telephone Encounter (Signed)
i spoke with pt and she states she has stopped the lisinopril. She is only taking the losartan. Nothing further was needed

## 2012-04-26 ENCOUNTER — Encounter (HOSPITAL_COMMUNITY): Payer: Medicare Other

## 2012-04-28 ENCOUNTER — Encounter (HOSPITAL_COMMUNITY): Payer: Medicare Other

## 2012-04-29 ENCOUNTER — Ambulatory Visit: Payer: Medicare Other | Admitting: Adult Health

## 2012-06-17 ENCOUNTER — Ambulatory Visit: Payer: Medicare Other | Admitting: Adult Health

## 2012-06-23 ENCOUNTER — Encounter: Payer: Self-pay | Admitting: Adult Health

## 2012-06-23 ENCOUNTER — Ambulatory Visit (INDEPENDENT_AMBULATORY_CARE_PROVIDER_SITE_OTHER): Payer: Medicare Other | Admitting: Adult Health

## 2012-06-23 VITALS — BP 134/76 | HR 91 | Temp 98.0°F | Ht 71.5 in | Wt 185.4 lb

## 2012-06-23 DIAGNOSIS — J441 Chronic obstructive pulmonary disease with (acute) exacerbation: Secondary | ICD-10-CM

## 2012-06-23 MED ORDER — PREDNISONE 10 MG PO TABS: ORAL_TABLET | ORAL | Status: DC

## 2012-06-23 MED ORDER — LEVALBUTEROL HCL 0.63 MG/3ML IN NEBU
0.6300 mg | INHALATION_SOLUTION | Freq: Once | RESPIRATORY_TRACT | Status: AC
  Administered 2012-06-23: 0.63 mg via RESPIRATORY_TRACT

## 2012-06-23 MED ORDER — DOXYCYCLINE HYCLATE 100 MG PO TABS: 100.0000 mg | ORAL_TABLET | Freq: Two times a day (BID) | ORAL | Status: DC

## 2012-06-23 NOTE — Progress Notes (Signed)
Subjective:    Patient ID: [name], female    DOB: 08-16-1929, 79 y.o.   MRN: [id number]  HPI  [name] , smoker presents for FU of COPD.  She has been on home O2 since dec 2010 ([employer]) after a chest cold requiring prednisone, CXR then showed hyperinflation. She smokes 1/2 PPD, about 73 Pyrs - chantix made her sick & she is afraid of cardiac side effects with patches. She sees Dr Myrtis Ser for CAD & has tolerated lisinopril & metoprolol, echo 7/10 showed nml LV fn & non dilated RV. An episode of syncope in 2009 was attributed to NTG & diuretics.  She reports clear phlegm, nasal drip & sneezing - no seasonal allergies, nasal atrovent dries her out.  She has lt >> Rt pedal edema  Desaturated to 86% onRA with minimal ambulation.  reviewed PFTs 08/19/09 >> severe airway obstruction, FEV1 47%, no BD response, air trapping +, severe decrease in diffusion  CT angio 5/12 asc aortic aneurysm 41mm, hepatic cysts  7/1 /2013 23m FU  Hosp 3/31- 08/04/11 for Newly recognized paroxysmal atrial fibrillation with RVR and COPD exacerbation  - not felt to be a coumadin candidate secondary to hx of GIB/AVMs    03/16/12  She was re- admitted October 17 -October 20 for slow to improve COPD, exacerbation, and possible decompensated chronic diastolic congestive heart failure in the setting of poor oral intake  Patient had been recently admitted October 12 for COPD, exacerbation, in which she was treated with antibiotics, and a steroid burst  She continued to have ongoing smoking despite multiple attempts of smoking cessation  She was changed from her ACE inhibitor over to losartan in hopes to decrease her recurrent cough and wheezing.  During her hospitalization she was treated given gentle diuresis for an elevated BNP.  Discharged to [facility]. Undergoing PT  Since discharge. Patient is feeling much better. NO smoking.  Cough /congestion is decreased.  Dyspnea is better.  Still weak and has hard time walking  without assistance.  No hemoptysis , edema. No n/v.  breathing is worse x yesterday, dry cough, sneezing. no wheezing, chest tx. uses 2 liters at rest/sleep and 3 liters w/ exertion She needs losartan refilled & also insurance requests xopenex change to albuterol >>  06/23/2012 Follow up for COPD  3 month follow up COPD - reports doing well overall.  Complains of  prod cough with light yellow mucus, increased SOB, sneezing for 3 days  OTC not helping.  No hemoptysis , chest pain, edema.  cXR 01/2012 COPD changes  Still smoking from time to time. Advised on smoking cessation and O2 dangers     Past Medical History  Diagnosis Date  . CAD (coronary artery disease)     s/p CABG x5 (2005)   . Hypertension   . Hyperlipidemia   . GI bleed     AVMs  . Aneurysm, aortic     thoracic aorta, stable at 4.1 cm, chest CT, May, 2012  . Syncope     Nitroglycerin plus a diuretic, April, 2009  . Hyponatremia     Chronic. Felt secondary to SIADH   . COPD (chronic obstructive pulmonary disease)   . Tobacco abuse   . Bradycardia   . Carotid artery disease     Hx of endarterectomy. Doppler October, 2011, stable, 0-39% RIC A., 40-59% LICA  . Tuberculosis     Exposures to tuberculosis 1970s, has tested negative by the health Department  . Atrial fibrillation  Paroxysmal with RVR 07/2011, not felt to be a coumadin candidate secondary to hx of GIB/AVM  . Venous stasis of lower extremity     Chronic  . Ejection fraction     EF 55-60%, echo, April, 2013  . CHF with left ventricular diastolic dysfunction, NYHA class 2     Diastolic dysfunction  . AAA (abdominal aortic aneurysm)   . On home O2     2L N/C  . Complication of anesthesia   . Shortness of breath         Review of Systems Constitutional:   No  weight loss, night sweats,  Fevers, chills, fatigue, or  lassitude.  HEENT:   No headaches,  Difficulty swallowing,  Tooth/dental problems, or  Sore throat,                No sneezing,  itching, ear ache,  +nasal congestion, post nasal drip,   CV:  No chest pain,  Orthopnea, PND, swelling in lower extremities, anasarca, dizziness, palpitations, syncope.   GI  No heartburn, indigestion, abdominal pain, nausea, vomiting, diarrhea, change in bowel habits, loss of appetite, bloody stools.   Resp:   No chest wall deformity  Skin: no rash or lesions.  GU: no dysuria, change in color of urine, no urgency or frequency.  No flank pain, no hematuria   MS:  No joint pain or swelling.  No decreased range of motion.  No back pain.  Psych:  No change in mood or affect. No depression or anxiety.  No memory loss.          Objective:   Physical Exam   Gen. Pleasant, well-nourished, in no distress, on O2 ENT - no lesions, no post nasal drip Neck: No JVD, no thyromegaly, no carotid bruits Lungs: no use of accessory muscles, no dullness to percussion, few rhonchi , trace exp wheeze  Cardiovascular: Rhythm regular, heart sounds  normal, no murmurs or gallops, no peripheral edema Musculoskeletal: No deformities, no cyanosis or clubbing         Assessment & Plan:

## 2012-06-23 NOTE — Assessment & Plan Note (Signed)
Flare   Plan  Doxycycline 100mg  Twice daily  For 7 days  Mucinex DM Twice daily  As needed  Cough/congestion  Prednisone taper over next week  Fluids and rest  Saline nasal rinses As needed   Please contact office for sooner follow up if symptoms do not improve or worsen or seek emergency care  follow up Dr. Vassie Loll  In 3 months  NO SMOKING

## 2012-06-23 NOTE — Addendum Note (Signed)
Addended by: Boone Master E on: 06/23/2012 03:23 PM   Modules accepted: Orders

## 2012-06-23 NOTE — Patient Instructions (Addendum)
Doxycycline 100mg  Twice daily  For 7 days  Mucinex DM Twice daily  As needed  Cough/congestion  Prednisone taper over next week  Fluids and rest  Saline nasal rinses As needed   Please contact office for sooner follow up if symptoms do not improve or worsen or seek emergency care  follow up Dr. Vassie Loll  In 3 months  NO SMOKING

## 2012-07-01 ENCOUNTER — Ambulatory Visit: Payer: Medicare Other | Admitting: Cardiology

## 2012-07-05 ENCOUNTER — Telehealth: Payer: Self-pay | Admitting: Adult Health

## 2012-07-05 MED ORDER — LEVOFLOXACIN 500 MG PO TABS: ORAL_TABLET | ORAL | Status: DC

## 2012-07-05 NOTE — Telephone Encounter (Signed)
Spoke with patient, informed her recs as listed below per TP. Patient verbalized understanding. Patient stated she is not smoking, has no appetite and has no swelling in extremities. Rx sent to verified pharmacy and nothing further needed at this time

## 2012-07-05 NOTE — Telephone Encounter (Signed)
Seen by TP 2.27.14: Patient Instructions    Doxycycline 100mg  Twice daily For 7 days  Mucinex DM Twice daily As needed Cough/congestion  Prednisone taper over next week  Fluids and rest  Saline nasal rinses As needed  Please contact office for sooner follow up if symptoms do not improve or worsen or seek emergency care  follow up Dr. Vassie Loll In 3 months  NO SMOKING     Called spoke with patient who reports that her symptoms are not much improved since last ov w/ TP Still having head and chest congestion with light yellow mucus Increased SOB, wheezing, tightness in chest Denies f/c/s Tammy please advise, thanks  **patient is still without power and will be difficult for her to come in for ov Allergies  Allergen Reactions  . Codeine Other (See Comments)    unknown  . Ranitidine Hcl Other (See Comments)    unknown  . Sulfonamide Derivatives Nausea And Vomiting  . Penicillins Rash and Other (See Comments)    unknown  [facility]

## 2012-07-05 NOTE — Telephone Encounter (Signed)
Can have Levaquin 500mg  1 po daily # 7 , no refills  If not improving will need ov with cxr .  Most important goal is to stop smoking .  Make sure she is eating ok and does not have increased edema.  Please contact office for sooner follow up if symptoms do not improve or worsen or seek emergency care

## 2012-07-18 ENCOUNTER — Inpatient Hospital Stay (HOSPITAL_COMMUNITY)
Admission: EM | Admit: 2012-07-18 | Discharge: 2012-07-22 | DRG: 249 | Disposition: A | Discharge status: Home / Self Care | Payer: Medicare Other | Attending: Internal Medicine | Admitting: Internal Medicine

## 2012-07-18 ENCOUNTER — Encounter (HOSPITAL_COMMUNITY)
Admission: EM | Disposition: A | Discharge status: Home / Self Care | Payer: Self-pay | Source: Home / Self Care | Attending: Internal Medicine

## 2012-07-18 ENCOUNTER — Encounter (HOSPITAL_COMMUNITY): Payer: Self-pay | Admitting: Emergency Medicine

## 2012-07-18 ENCOUNTER — Ambulatory Visit (HOSPITAL_COMMUNITY): Admit: 2012-07-18 | Payer: Self-pay | Admitting: Cardiology

## 2012-07-18 DIAGNOSIS — E785 Hyperlipidemia, unspecified: Secondary | ICD-10-CM

## 2012-07-18 DIAGNOSIS — I509 Heart failure, unspecified: Secondary | ICD-10-CM | POA: Diagnosis present

## 2012-07-18 DIAGNOSIS — J9610 Chronic respiratory failure, unspecified whether with hypoxia or hypercapnia: Secondary | ICD-10-CM | POA: Diagnosis present

## 2012-07-18 DIAGNOSIS — E876 Hypokalemia: Secondary | ICD-10-CM

## 2012-07-18 DIAGNOSIS — I2510 Atherosclerotic heart disease of native coronary artery without angina pectoris: Secondary | ICD-10-CM

## 2012-07-18 DIAGNOSIS — I252 Old myocardial infarction: Secondary | ICD-10-CM

## 2012-07-18 DIAGNOSIS — I872 Venous insufficiency (chronic) (peripheral): Secondary | ICD-10-CM | POA: Diagnosis present

## 2012-07-18 DIAGNOSIS — I200 Unstable angina: Secondary | ICD-10-CM

## 2012-07-18 DIAGNOSIS — R9430 Abnormal result of cardiovascular function study, unspecified: Secondary | ICD-10-CM | POA: Diagnosis present

## 2012-07-18 DIAGNOSIS — I4891 Unspecified atrial fibrillation: Secondary | ICD-10-CM

## 2012-07-18 DIAGNOSIS — Z87891 Personal history of nicotine dependence: Secondary | ICD-10-CM

## 2012-07-18 DIAGNOSIS — Z720 Tobacco use: Secondary | ICD-10-CM | POA: Diagnosis present

## 2012-07-18 DIAGNOSIS — I714 Abdominal aortic aneurysm, without rupture, unspecified: Secondary | ICD-10-CM | POA: Diagnosis present

## 2012-07-18 DIAGNOSIS — Z951 Presence of aortocoronary bypass graft: Secondary | ICD-10-CM

## 2012-07-18 DIAGNOSIS — I5032 Chronic diastolic (congestive) heart failure: Secondary | ICD-10-CM | POA: Diagnosis present

## 2012-07-18 DIAGNOSIS — J449 Chronic obstructive pulmonary disease, unspecified: Secondary | ICD-10-CM | POA: Diagnosis present

## 2012-07-18 DIAGNOSIS — I959 Hypotension, unspecified: Secondary | ICD-10-CM

## 2012-07-18 DIAGNOSIS — I25810 Atherosclerosis of coronary artery bypass graft(s) without angina pectoris: Secondary | ICD-10-CM | POA: Diagnosis present

## 2012-07-18 DIAGNOSIS — J4489 Other specified chronic obstructive pulmonary disease: Secondary | ICD-10-CM | POA: Diagnosis present

## 2012-07-18 DIAGNOSIS — IMO0002 Reserved for concepts with insufficient information to code with codable children: Secondary | ICD-10-CM | POA: Diagnosis present

## 2012-07-18 DIAGNOSIS — D649 Anemia, unspecified: Secondary | ICD-10-CM

## 2012-07-18 DIAGNOSIS — I10 Essential (primary) hypertension: Secondary | ICD-10-CM

## 2012-07-18 DIAGNOSIS — E871 Hypo-osmolality and hyponatremia: Secondary | ICD-10-CM

## 2012-07-18 DIAGNOSIS — Z9981 Dependence on supplemental oxygen: Secondary | ICD-10-CM

## 2012-07-18 DIAGNOSIS — I214 Non-ST elevation (NSTEMI) myocardial infarction: Principal | ICD-10-CM | POA: Diagnosis present

## 2012-07-18 HISTORY — PX: CORONARY ANGIOPLASTY WITH STENT PLACEMENT: SHX49

## 2012-07-18 HISTORY — PX: PERCUTANEOUS CORONARY STENT INTERVENTION (PCI-S): SHX5485

## 2012-07-18 HISTORY — PX: LEFT HEART CATHETERIZATION WITH CORONARY ANGIOGRAM: SHX5451

## 2012-07-18 LAB — MRSA PCR SCREENING: MRSA by PCR: NEGATIVE

## 2012-07-18 LAB — COMPREHENSIVE METABOLIC PANEL
ALT: 13 U/L (ref 0–35)
Alkaline Phosphatase: 63 U/L (ref 39–117)
CO2: 31 mEq/L (ref 19–32)
Chloride: 95 mEq/L — ABNORMAL LOW (ref 96–112)
GFR calc Af Amer: 65 mL/min — ABNORMAL LOW (ref >90)
Glucose, Bld: 155 mg/dL — ABNORMAL HIGH (ref 70–99)
Potassium: 3.3 mEq/L — ABNORMAL LOW (ref 3.5–5.1)
Sodium: 134 mEq/L — ABNORMAL LOW (ref 135–145)
Total Protein: 5.8 g/dL — ABNORMAL LOW (ref 6.0–8.3)

## 2012-07-18 LAB — POCT I-STAT, CHEM 8
Creatinine, Ser: 1.1 mg/dL (ref 0.50–1.10)
Glucose, Bld: 151 mg/dL — ABNORMAL HIGH (ref 70–99)
Hemoglobin: 11.2 g/dL — ABNORMAL LOW (ref 12.0–15.0)
Potassium: 3.3 mEq/L — ABNORMAL LOW (ref 3.5–5.1)

## 2012-07-18 LAB — CBC
Hemoglobin: 11.4 g/dL — ABNORMAL LOW (ref 12.0–15.0)
RBC: 3.82 MIL/uL — ABNORMAL LOW (ref 3.87–5.11)
WBC: 5.3 10*3/uL (ref 4.0–10.5)

## 2012-07-18 LAB — APTT: aPTT: 32 seconds (ref 24–37)

## 2012-07-18 SURGERY — LEFT HEART CATHETERIZATION WITH CORONARY ANGIOGRAM
Anesthesia: LOCAL

## 2012-07-18 MED ORDER — BIVALIRUDIN 250 MG IV SOLR
INTRAVENOUS | Status: AC
  Filled 2012-07-18: qty 250

## 2012-07-18 MED ORDER — CLOPIDOGREL BISULFATE 75 MG PO TABS
75.0000 mg | ORAL_TABLET | Freq: Every day | ORAL | Status: DC
  Administered 2012-07-19 – 2012-07-22 (×4): 75 mg via ORAL
  Filled 2012-07-18 (×5): qty 1

## 2012-07-18 MED ORDER — SODIUM CHLORIDE 0.9 % IV SOLN
INTRAVENOUS | Status: DC
  Administered 2012-07-18: 20 mL via INTRAVENOUS

## 2012-07-18 MED ORDER — METOPROLOL TARTRATE 1 MG/ML IV SOLN
INTRAVENOUS | Status: AC
  Administered 2012-07-18: 2.5 mg
  Filled 2012-07-18: qty 5

## 2012-07-18 MED ORDER — SODIUM CHLORIDE 0.9 % IJ SOLN: 3.0000 mL | INTRAMUSCULAR | Status: DC | PRN

## 2012-07-18 MED ORDER — CLOPIDOGREL BISULFATE 300 MG PO TABS
ORAL_TABLET | ORAL | Status: AC
  Filled 2012-07-18: qty 2

## 2012-07-18 MED ORDER — HEPARIN SODIUM (PORCINE) 5000 UNIT/ML IJ SOLN
4000.0000 [IU] | INTRAMUSCULAR | Status: AC
  Administered 2012-07-18: 4000 [IU] via INTRAVENOUS

## 2012-07-18 MED ORDER — LIDOCAINE HCL (PF) 1 % IJ SOLN
INTRAMUSCULAR | Status: AC
  Filled 2012-07-18: qty 30

## 2012-07-18 MED ORDER — SODIUM CHLORIDE 0.9 % IJ SOLN
3.0000 mL | Freq: Two times a day (BID) | INTRAMUSCULAR | Status: DC
  Administered 2012-07-19 – 2012-07-21 (×5): 3 mL via INTRAVENOUS

## 2012-07-18 MED ORDER — PANTOPRAZOLE SODIUM 40 MG PO TBEC
40.0000 mg | DELAYED_RELEASE_TABLET | Freq: Every day | ORAL | Status: DC
  Administered 2012-07-18 – 2012-07-22 (×5): 40 mg via ORAL
  Filled 2012-07-18 (×5): qty 1

## 2012-07-18 MED ORDER — ASPIRIN 81 MG PO CHEW: 324.0000 mg | CHEWABLE_TABLET | Freq: Once | ORAL | Status: DC

## 2012-07-18 MED ORDER — NOREPINEPHRINE BITARTRATE 1 MG/ML IJ SOLN
2.0000 ug/min | INTRAVENOUS | Status: DC
  Filled 2012-07-18: qty 4

## 2012-07-18 MED ORDER — DOCUSATE SODIUM 100 MG PO CAPS
100.0000 mg | ORAL_CAPSULE | Freq: Two times a day (BID) | ORAL | Status: DC
  Administered 2012-07-18 – 2012-07-22 (×7): 100 mg via ORAL
  Filled 2012-07-18 (×9): qty 1

## 2012-07-18 MED ORDER — NOREPINEPHRINE BITARTRATE 1 MG/ML IJ SOLN
2.0000 ug/min | INTRAMUSCULAR | Status: DC
  Filled 2012-07-18: qty 4

## 2012-07-18 MED ORDER — ONDANSETRON HCL 4 MG/2ML IJ SOLN
INTRAMUSCULAR | Status: AC
  Administered 2012-07-18: 19:00:00
  Filled 2012-07-18: qty 2

## 2012-07-18 MED ORDER — NOREPINEPHRINE BITARTRATE 1 MG/ML IJ SOLN: 2.0000 ug/min | INTRAVENOUS | Status: DC

## 2012-07-18 MED ORDER — SODIUM CHLORIDE 0.9 % IV SOLN: 250.0000 mL | INTRAVENOUS | Status: DC | PRN

## 2012-07-18 MED ORDER — SODIUM CHLORIDE 0.9 % IJ SOLN
3.0000 mL | Freq: Two times a day (BID) | INTRAMUSCULAR | Status: DC
  Administered 2012-07-18 – 2012-07-22 (×5): 3 mL via INTRAVENOUS

## 2012-07-18 MED ORDER — FENTANYL CITRATE 0.05 MG/ML IJ SOLN
INTRAMUSCULAR | Status: AC
  Filled 2012-07-18: qty 2

## 2012-07-18 MED ORDER — ACETAMINOPHEN 325 MG PO TABS
650.0000 mg | ORAL_TABLET | ORAL | Status: DC | PRN
  Administered 2012-07-19: 650 mg via ORAL

## 2012-07-18 MED ORDER — NOREPINEPHRINE BITARTRATE 1 MG/ML IJ SOLN
INTRAMUSCULAR | Status: AC
  Administered 2012-07-18: 10 ug via INTRAVENOUS
  Filled 2012-07-18: qty 4

## 2012-07-18 MED ORDER — BISOPROLOL FUMARATE 5 MG PO TABS
5.0000 mg | ORAL_TABLET | Freq: Every day | ORAL | Status: DC
  Administered 2012-07-18 – 2012-07-22 (×5): 5 mg via ORAL
  Filled 2012-07-18 (×5): qty 1

## 2012-07-18 MED ORDER — BUDESONIDE-FORMOTEROL FUMARATE 160-4.5 MCG/ACT IN AERO
2.0000 | INHALATION_SPRAY | Freq: Two times a day (BID) | RESPIRATORY_TRACT | Status: DC
  Administered 2012-07-18 – 2012-07-22 (×8): 2 via RESPIRATORY_TRACT
  Filled 2012-07-18: qty 6

## 2012-07-18 MED ORDER — ASPIRIN 81 MG PO CHEW: 324.0000 mg | CHEWABLE_TABLET | ORAL | Status: AC

## 2012-07-18 MED ORDER — ASPIRIN 81 MG PO TABS: 81.0000 mg | ORAL_TABLET | Freq: Every day | ORAL | Status: DC

## 2012-07-18 MED ORDER — ONDANSETRON HCL 4 MG/2ML IJ SOLN: 4.0000 mg | Freq: Four times a day (QID) | INTRAMUSCULAR | Status: DC | PRN

## 2012-07-18 MED ORDER — ACETAMINOPHEN 325 MG PO TABS
650.0000 mg | ORAL_TABLET | ORAL | Status: DC | PRN
  Filled 2012-07-18: qty 2

## 2012-07-18 MED ORDER — SENNOSIDES-DOCUSATE SODIUM 8.6-50 MG PO TABS
1.0000 | ORAL_TABLET | Freq: Every day | ORAL | Status: DC
  Administered 2012-07-18 – 2012-07-21 (×3): 1 via ORAL
  Filled 2012-07-18 (×5): qty 1

## 2012-07-18 MED ORDER — SODIUM CHLORIDE 0.9 % IV SOLN
1.0000 mL/kg/h | INTRAVENOUS | Status: AC
  Administered 2012-07-18: 1.005 mL/kg/h via INTRAVENOUS

## 2012-07-18 MED ORDER — ASPIRIN EC 81 MG PO TBEC
81.0000 mg | DELAYED_RELEASE_TABLET | Freq: Every day | ORAL | Status: DC
  Administered 2012-07-19 – 2012-07-22 (×4): 81 mg via ORAL
  Filled 2012-07-18 (×4): qty 1

## 2012-07-18 MED ORDER — ATORVASTATIN CALCIUM 40 MG PO TABS
40.0000 mg | ORAL_TABLET | Freq: Every day | ORAL | Status: DC
  Administered 2012-07-18 – 2012-07-21 (×4): 40 mg via ORAL
  Filled 2012-07-18 (×5): qty 1

## 2012-07-18 MED ORDER — TIOTROPIUM BROMIDE MONOHYDRATE 18 MCG IN CAPS
18.0000 ug | ORAL_CAPSULE | Freq: Every day | RESPIRATORY_TRACT | Status: DC
  Administered 2012-07-19 – 2012-07-22 (×4): 18 ug via RESPIRATORY_TRACT
  Filled 2012-07-18: qty 5

## 2012-07-18 MED ORDER — HEPARIN (PORCINE) IN NACL 2-0.9 UNIT/ML-% IJ SOLN
INTRAMUSCULAR | Status: AC
  Filled 2012-07-18: qty 1000

## 2012-07-18 MED ORDER — POTASSIUM CHLORIDE 20 MEQ/15ML (10%) PO LIQD
40.0000 meq | Freq: Once | ORAL | Status: AC
  Administered 2012-07-18: 40 meq via ORAL
  Filled 2012-07-18: qty 30

## 2012-07-18 MED ORDER — SENNA-DOCUSATE SODIUM 8.6-50 MG PO TABS: 1.0000 | ORAL_TABLET | Freq: Every day | ORAL | Status: DC

## 2012-07-18 MED ORDER — SODIUM CHLORIDE 0.9 % IV SOLN: 0.2500 mg/kg/h | INTRAVENOUS | Status: AC

## 2012-07-18 MED ORDER — ZOLPIDEM TARTRATE 5 MG PO TABS: 5.0000 mg | ORAL_TABLET | Freq: Every evening | ORAL | Status: DC | PRN

## 2012-07-18 MED ORDER — ASPIRIN 300 MG RE SUPP
300.0000 mg | RECTAL | Status: AC
  Filled 2012-07-18: qty 1

## 2012-07-18 MED ORDER — MIDAZOLAM HCL 2 MG/2ML IJ SOLN
INTRAMUSCULAR | Status: AC
  Filled 2012-07-18: qty 2

## 2012-07-18 MED ORDER — ALBUTEROL SULFATE HFA 108 (90 BASE) MCG/ACT IN AERS
2.0000 | INHALATION_SPRAY | Freq: Four times a day (QID) | RESPIRATORY_TRACT | Status: DC | PRN
Start: 2012-07-18 — End: 2012-07-22
  Administered 2012-07-19 – 2012-07-22 (×5): 2 via RESPIRATORY_TRACT
  Filled 2012-07-18: qty 6.7

## 2012-07-18 MED ORDER — NITROGLYCERIN 0.4 MG SL SUBL: 0.4000 mg | SUBLINGUAL_TABLET | SUBLINGUAL | Status: DC | PRN

## 2012-07-18 MED ORDER — ALPRAZOLAM 0.25 MG PO TABS: 0.2500 mg | ORAL_TABLET | Freq: Two times a day (BID) | ORAL | Status: DC | PRN

## 2012-07-18 NOTE — CV Procedure (Signed)
[facility] CARDIAC CATHETERIZATION AND PERCUTANEOUS CORONARY INTERVENTION REPORT  NAME:  [name]   MRN: [id number] DOB:  07-30-1929   ADMIT DATE: 07/18/2012 Procedure Date: 07/18/2012  INTERVENTIONAL CARDIOLOGIST: Marykay Lex, M.D., MS PRIMARY CARE PROVIDER: Kaleen Mask, MD PRIMARY CARDIOLOGIST: Myrtis Ser, JEFFREY  PATIENT:  [name] is a 78 y.o. 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 Presents to ER with acute onset of CP radiating to L arm associated with nausea and vomiting. Code STEMI activated by ER, downgraded to Urgent NSTEMI after review of ECG noted diffuse ST Depression in inferior & Lateral leads. After discussion with Dr. Gala Romney, we decided that with ischemic ECG changes & ongoing CP with hypotension (SBP dropped to 66 mmHg after 2.5 mg IV Lopressor for Afib RVR) that she warranted urgent cardiac catheterization. She was started on low dose Levophed & brought to the cath lab.   PRE-OPERATIVE DIAGNOSIS:    Acute Coronary Syndrome  Ischemic ECG Changes -- 2-3 mm ST depression in II, III, aVF, V4-6  Afib RVR  Prior CABG  PROCEDURES PERFORMED:    Left Heart Catheterization with Native Coronary & Graft Angiography  Left Ventriculography  Percutaneous Coronary Intervention of subtotal (~99%) occluded SVG-Diagonal with a Veriflex BMS 3.0 mm x 20 mm - post-dilated to 3.4 mm  PROCEDURE:Consent:  Risks of procedure as well as the alternatives and risks of each were explained to the (patient/caregiver).  Consent for procedure obtained. Consent for signed by MD and patient with RN witness -- placed on chart.   PROCEDURE: The patient was brought to the 2nd Floor [place]  Cardiac Catheterization Lab in the fasting state and prepped and draped in the usual sterile fashion for Right groin access. A modified Allen's test with plethysmography was performed, revealing excellent Ulnar artery collateral flow.  Sterile technique was used including antiseptics, cap, gloves, gown, hand hygiene, mask and sheet.  Skin prep: Chlorhexidine.  Time Out: Verified patient identification, verified procedure, site/side was marked, verified correct patient position, special equipment/implants available, medications/allergies/relevent history reviewed, required imaging and test results available.  Performed  Access: Right Common Femoral Artery; 6 Fr Sheath -- fluoroscopically guided, modified Seldinger technique  Diagnostic:  6 Fr Catheters advanced and exchanged over a Long-exchange J-wire.  Left Coronary Artery Angiography: JL4  Native Right Coronary Artery & SVG-RCAAngiography: JR4  SVG-OM1-2 & SVG-Diag Angiography: AL1, and a after JR 4 was unsuccessful engaging the grafts  The LIMA graft was not visualized do to the anomalous Left Subclavian takeoff but competitive flow down the LAD was noted   PCI: Described below  LV Hemodynamics (LV Gram): Post PCI, angled pigtail  Arteriotomy closure: After the LV gram, a selective right femoral angiogram via the sheath was performed demonstrating adequate anatomy for closure.  The groin was then reprepped in sterile fashion, and new sterile gloves were donned.  The arteriotomy was closed with a 6/7 French Mynx closure device with excellent hemostasis  MEDICATIONS:  Anesthesia:  Local Lidocaine 18 ml  Sedation:  2 mg IV Versed, 25 mcg IV fentanyl ;   Premedication: 4 mg IV Zofran, 4000 units IV heparin given in ER.  Omnipaque Contrast: 220 ml  Anticoagulation: Angiomax Bolus & drip  Anti-Platelet Agent:  600 mg oral clopidogrel  Findings: Hemodynamics:  Central Aortic / Mean Pressures: 106/64  mmHg; 82  mmHg; final  pressure 134/62  mmHg; 88  mmHg  Left Ventricular Pressures / EDP: 110/9  mmHg; 21  mmHg  Left Ventriculography:  EF: 65-70    %  Wall Motion:  Normal, 3+ MR  Coronary Anatomy:  Left Main:  large-caliber vessel with proximal 40% stenosis., Bifurcates into circumflex and LAD. LAD: Large-caliber vessel with a proximal 80% long tubular stenosis. It gives off a proximal first diagonal branch that is small and angiographically normal. The second diagonal branch appeared to be grafted with backfilling dye into the graft but not reaching the ring. There is however reasonable flow down the native D2 that has proximal tubular 60-70% lesions. The distal LAD as competitive flow as it wraps around the apex suggestive of a patent LIMA.  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 This would require left radial access for actually angiography.  SVG-D2: Large-caliber graft with a subtotal, 99% irregular occlusion followed by the remainder the graft being essentially normal. There was dye hangup in the distal portion of the graft that cleared after balloon angioplasty. Left Circumflex:  probably moderate large-caliber vessel with severe ostial stenosis of roughly 90%. The vessel then gives off and OM1 which has competitive flow as well as the AV groove circumflex which tapers to be excluded. There is a 6 suggestion of competitive flow in distal occluded portion. SVG-OM1-OM 2 widely patent graft with retrograde flow from both branches that did not quite reach the AV groove circumflex.  OM1: Filled via proximal limb of SVG; minimal luminal irregularities  OM 2:  filled via sequential limb of SVG; minimal luminal irregularities  RCA: Large-caliber vessel with ostial 30% stenosis followed by diffuse lesions ranging from 80% proximal long tubular 90% mid then his sense occluded in the distal portion prior to bifurcation.   SVG-RPDA: Widely patent, large-caliber  graft.  RPDA:  fills antegrade via SVG. Diffuse, mild luminal irregularities.  RPL Sysytem:The RPAV fills via retrograde flow from SVG-RPDA. As a moderate caliber vessel gives off 3 posterior lateral branches with diffuse, mild luminal irregularities.   After reviewing the preliminary angiography, the clear-cut culprit lesion is the proximal SVG-D2. As it did appear to be a channel through the stenosis, the decision made to attempt PCI. Angiomax bolus was administered along with 600 mg oral clopidogrel.  Percutaneous Coronary Intervention:   Guide: 6 Fr   AL-1 Guidewire:  BMW -- initially, the wire was unable to pass. Was successfully passed with balloon support. Predilation Balloon:  Emerge MR  2.0 mm x 12  mm; multiple inflations from distal proximal  6 Atm x  30  Sec, 8 Atm x  30  Sec,8  Atm x  30 Sec Stent:  Veriflex BMS  3.0 mm x 20  mm; 16 Atm x  30  Sec,  Post-dilation with stent Balloon: 14 mm x 60 mm;   Final Diameter: 3.4 mm   Post deployment angiography in multiple views, with and without guidewire in place revealed excellent stent deployment and lesion coverage.  There was no evidence of dissection or perforation. Brisk TIMI 3 flow was restored, with no evidence of no reflow.   PATIENT DISPOSITION:    The patient was transferred to the PACU holding area in a hemodynamicaly stable, chest pain free condition.  The patient tolerated the procedure well, and there were no complications.  EBL:   <  10 ml  The patient was stable before, during, and after the procedure.  POST-OPERATIVE DIAGNOSIS:    Severe native coronary disease as described.  Severe proximal stenosis of the SVG-D2: Subtotal, 99% occlusion  Successful PTCA/PCI of the possible SVG-D2 with a Veriflex BMS 3.0 mm x 20 mm, postdilated to 3.4 mm  Widely patent SVGs to both the RPDA and sequential to OM1-OM 2  Well-preserved EF by LV gram however there is evidence of significant MR.  Successful arteriotomy  closure with Mynx closure device  PLAN OF CARE:  Admit to CCU for overnight care under the [facility] service. Will need rate control for A. fib.   Will continue Angiomax at reduced rate for 2 hours post PCI.  Dual antiplatelet therapy for a minimum of 1 month.  Would order a 2-D echocardiogram to evaluate MR seen on LV gram.  Procedure was discussed with the patient's daughter-in-law and grandson. Results were also reviewed with Dr. Gala Romney and Dr. Adolm Joseph (fellow on call)   Marykay Lex, M.D.,  aM.S. [facility] [address] [place], [place]  [address]  [phone]  07/18/2012 8:49 PM

## 2012-07-18 NOTE — H&P (Signed)
Cardiologist: Myrtis Ser  HPI:  79 y/o woman with multiple medical problems including COPD on home O2, PAF (not on coumadin due to h/o GIB/AVMs), diastolic HF and CAD s/p MI  CABG in 2005. Presents to ER with acute onset of CP radiating to L arm associated with nausea and vomiting. Code STEMI activated by ER.   Last cath in 4/ 2005 several months after her MI. 3V CAD with patent SVG to diagonal, patent SVG to OM1 sequential to OM2, SVG to PDA. 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  Denies any recent CP. Says over the weekend she had a stomach virus and felt run down. About 330 or 4pm this afternoon developed acute onset CP radiating to L arm associated with nausea and vomiting. Pain persisted so called EMS. Got ASA x 1 and SL NTG without any help. ECG with AF with RVR 120 with diffuse severe ST depression and isolated ST elevation in AVR. In ER patient with persistent 5/10 CP and nausea. SBP 90 range. Given lopressor 2.5mg  IV and started on heparin. SBP dropped to 60 and levophed started. Decision made to go to cath lab emergently. Denies recent bleeding or melena.   Review of Systems:     Cardiac Review of Systems: {Y] = yes [ ]  = no  Chest Pain [  y  ]  Resting SOB [   ] Exertional SOB  [  y]  Orthopnea [  ]   Pedal Edema [   ]    Palpitations [  ] Syncope  [  ]   Presyncope [   ]  General Review of Systems: [Y] = yes [  ]=no Constitional: recent weight change [  ]; anorexia [  ]; fatigue [  ]; nausea [  ]; night sweats [  ]; fever [  ]; or chills [  ];                                                                                                                                          Dental: poor dentition[  ];  Eye : blurred vision [  ]; diplopia [   ]; vision changes [  ];  Amaurosis fugax[  ]; Resp: cough Cove.Etienne  ];  wheezing[  ];  hemoptysis[  ]; shortness of breath[y  ]; paroxysmal nocturnal dyspnea[  ]; dyspnea on exertion[  y]; or orthopnea[  ];  GI:  gallstones[   ], vomiting[y  ];  dysphagia[  ]; melena[  ];  hematochezia [  ]; heartburn[  ];  GU: kidney stones [  ]; hematuria[  ];   dysuria [  ];  nocturia[  ];  history of     obstruction [  ];                 Skin: rash, swelling[  ];, hair loss[  ];  peripheral edema[  ];  or itching[  ]; Musculosketetal: myalgias[  ];  joint swelling[  ];  joint erythema[  ];  joint pain[  ];  back pain[  ];  Heme/Lymph: bruising[  ];  bleeding[  ];  anemia[  ];  Neuro: TIA[  ];  headaches[  ];  stroke[  ];  vertigo[  ];  seizures[  ];   paresthesias[  ];  difficulty walking[y  ];  Psych:depression[  ]; anxiety[  ];  Endocrine: diabetes[  ];  thyroid dysfunction[  ];  Other:  Past Medical History  Diagnosis Date  . CAD (coronary artery disease)     s/p CABG x5 (2005)   . Hypertension   . Hyperlipidemia   . GI bleed     AVMs  . Aneurysm, aortic     thoracic aorta, stable at 4.1 cm, chest CT, May, 2012  . Syncope     Nitroglycerin plus a diuretic, April, 2009  . Hyponatremia     Chronic. Felt secondary to SIADH   . COPD (chronic obstructive pulmonary disease)   . Tobacco abuse   . Bradycardia   . Carotid artery disease     Hx of endarterectomy. Doppler October, 2011, stable, 0-39% RIC A., 40-59% LICA  . Tuberculosis     Exposures to tuberculosis 1970s, has tested negative by the health Department  . Atrial fibrillation     Paroxysmal with RVR 07/2011, not felt to be a coumadin candidate secondary to hx of GIB/AVM  . Venous stasis of lower extremity     Chronic  . Ejection fraction     EF 55-60%, echo, April, 2013  . CHF with left ventricular diastolic dysfunction, NYHA class 2     Diastolic dysfunction  . AAA (abdominal aortic aneurysm)   . On home O2     2L N/C  . Complication of anesthesia   . Shortness of breath      (Not in a hospital admission)   Allergies  Allergen Reactions  . Codeine Other (See Comments)    unknown  . Ranitidine Hcl Other (See Comments)    unknown  .  Sulfonamide Derivatives Nausea And Vomiting  . Penicillins Rash and Other (See Comments)    unknown    History   Social History  . Marital Status: Widowed    Spouse Name: N/A    Number of Children: 1  . Years of Education: N/A   Occupational History  . retired     [employer]   Social History Main Topics  . Smoking status: Former Smoker -- 0.50 packs/day for 59 years    Types: Cigarettes    Quit date: 02/04/2012  . Smokeless tobacco: Not on file  . Alcohol Use: No  . Drug Use: Not on file  . Sexually Active: Not on file   Other Topics Concern  . Not on file   Social History Narrative  . No narrative on file    Family History  Problem Relation Age of Onset  . Stroke Sister   . Heart failure Mother   . Cancer Sister     Breast and Lung   . Cancer Brother     breast cancer    PHYSICAL EXAM: Filed Vitals:   07/18/12 1817  BP: 100/71  Pulse: 107  Temp: 97.4 F (36.3 C)  Resp:    General:  Elderly. Chronically ill appearing. ncomfortable No respiratory difficulty HEENT: normal Neck: supple. JVP hard to see. Looks mildly elevated  Carotids 1+ bilat; no bruits. No lymphadenopathy or thryomegaly appreciated. Cor: PMI nondisplaced. Irregular rate & rhythm. Tachy. No rubs, gallops or murmurs. Lungs: clear with decreased BS throughout Abdomen: soft, nontender, nondistended. No hepatosplenomegaly. No bruits or masses. Good bowel sounds. Extremities: no cyanosis, clubbing, rash, edema Neuro: alert & oriented x 3, cranial nerves grossly intact. moves all 4 extremities w/o difficulty. Affect pleasant.  ECG: AF with RVR diffuse 3-4 mm ST depression with isoalted ST elevation in AVR  Results for orders placed during the hospital encounter of 07/18/12 (from the past 24 hour(s))  CBC     Status: Abnormal   Collection Time    07/18/12  6:34 PM      Result Value Range   WBC 5.3  4.0 - 10.5 K/uL   RBC 3.82 (*) 3.87 - 5.11 MIL/uL   Hemoglobin 11.4 (*) 12.0 - 15.0 g/dL   HCT  29.5 (*) 62.1 - 46.0 %   MCV 84.8  78.0 - 100.0 fL   MCH 29.8  26.0 - 34.0 pg   MCHC 35.2  30.0 - 36.0 g/dL   RDW 30.8  65.7 - 84.6 %   Platelets 154  150 - 400 K/uL  POCT I-STAT TROPONIN I     Status: None   Collection Time    07/18/12  6:47 PM      Result Value Range   Troponin i, poc 0.01  0.00 - 0.08 ng/mL   Comment 3           POCT I-STAT, CHEM 8     Status: Abnormal   Collection Time    07/18/12  6:52 PM      Result Value Range   Sodium 134 (*) 135 - 145 mEq/L   Potassium 3.3 (*) 3.5 - 5.1 mEq/L   Chloride 97  96 - 112 mEq/L   BUN 13  6 - 23 mg/dL   Creatinine, Ser 9.62  0.50 - 1.10 mg/dL   Glucose, Bld 952 (*) 70 - 99 mg/dL   Calcium, Ion 8.41  3.24 - 1.30 mmol/L   TCO2 31  0 - 100 mmol/L   Hemoglobin 11.2 (*) 12.0 - 15.0 g/dL   HCT 40.1 (*) 02.7 - 25.3 %   No results found.   ASSESSMENT: 1. Unstable angina 2. CAD s/p CABG 2005 3. Recurrent PAF with RVR (previously not felt to be coumadin candidate due to GIB/AVMs) 4. COPD on home O2 5. Hypotension 6. Chronic diastolic HF 7. Anemia 8. Hyponatremia, chronic 9. Hypokalemia  PLAN/DISCUSSION:  Symptoms and ECG concerning for possible graft closure. Have reviewed situation with on-call interventionalist, Dr. Herbie Baltimore, will take to cath lab urgently. Recurrent AF could also be inciting factor (versus reactive to ischemia). Start heparin. Support with pressors as needed. Further management based on results of cath.   Rhyen Mazariego,MD 7:08 PM

## 2012-07-18 NOTE — ED Notes (Signed)
Per EMS pt began having some chest pain around 1630 that began in left chest and radiated to left arm. Pt describes pain as pressure and rates it 5/10. Pt has been having nausea and vomiting for weeks. Pt took 81mg  aspirin and 2 nitro prior to EMS arrival with no relief. Pt was given 240mg  aspirin en route and 4mg  zofran, 500mg  bolus NS. Inititial B/p 92/58 came up to 100/68 after fluids, HR between 90-140, 98% 3L. Pt has extensive cardiac hx. 20g RH.

## 2012-07-18 NOTE — ED Notes (Signed)
Cardiologist at the bedside

## 2012-07-18 NOTE — ED Notes (Signed)
Pt placed on defib pads

## 2012-07-18 NOTE — ED Notes (Signed)
Activated Code Stemi  

## 2012-07-18 NOTE — ED Provider Notes (Signed)
History     CSN: [id number]  Arrival date & time 07/18/12  1757   First MD Initiated Contact with Patient 07/18/12 1815      Chief Complaint  Patient presents with  . Chest Pain    (Consider location/radiation/quality/duration/timing/severity/associated sxs/prior treatment) Patient is a 79 y.o. female presenting with chest pain. The history is provided by the patient and the EMS personnel.  Chest Pain Associated symptoms: nausea and vomiting   Associated symptoms: no abdominal pain, no back pain, no headache, no numbness, no shortness of breath and no weakness    patient's had nausea and vomiting for the last few days. At around 3 hours prior to arrival she developed chest pain. It is a pressure. She also feels lightheaded. She states she's been weak over the last few days. No abdominal pain. No diarrhea. No black stools. She has had nitroglycerin aspirin at home. She's had previous coronary artery bypass graft.  Past Medical History  Diagnosis Date  . CAD (coronary artery disease)     s/p CABG x5 (2005)   . Hypertension   . Hyperlipidemia   . GI bleed     AVMs  . Aneurysm, aortic     thoracic aorta, stable at 4.1 cm, chest CT, May, 2012  . Syncope     Nitroglycerin plus a diuretic, April, 2009  . Hyponatremia     Chronic. Felt secondary to SIADH   . COPD (chronic obstructive pulmonary disease)   . Tobacco abuse   . Bradycardia   . Carotid artery disease     Hx of endarterectomy. Doppler October, 2011, stable, 0-39% RIC A., 40-59% LICA  . Tuberculosis     Exposures to tuberculosis 1970s, has tested negative by the health Department  . Atrial fibrillation     Paroxysmal with RVR 07/2011, not felt to be a coumadin candidate secondary to hx of GIB/AVM  . Venous stasis of lower extremity     Chronic  . Ejection fraction     EF 55-60%, echo, April, 2013  . CHF with left ventricular diastolic dysfunction, NYHA class 2     Diastolic dysfunction  . AAA (abdominal aortic  aneurysm)   . On home O2     2L N/C  . Complication of anesthesia   . Shortness of breath     Past Surgical History  Procedure Laterality Date  . Cholecystectomy    . Carotid endartercetomy    . Abdominal hysterectomy    . Coronary artery bypass graft  2005    Family History  Problem Relation Age of Onset  . Stroke Sister   . Heart failure Mother   . Cancer Sister     Breast and Lung   . Cancer Brother     breast cancer    History  Substance Use Topics  . Smoking status: Former Smoker -- 0.50 packs/day for 59 years    Types: Cigarettes    Quit date: 02/04/2012  . Smokeless tobacco: Not on file  . Alcohol Use: No    OB History   Grav Para Term Preterm Abortions TAB SAB Ect Mult Living                  Review of Systems  Constitutional: Negative for activity change and appetite change.  HENT: Negative for neck stiffness.   Eyes: Negative for pain.  Respiratory: Negative for chest tightness and shortness of breath.   Cardiovascular: Positive for chest pain. Negative for leg swelling.  Gastrointestinal:  Positive for nausea and vomiting. Negative for abdominal pain and diarrhea.  Genitourinary: Negative for flank pain.  Musculoskeletal: Negative for back pain.  Skin: Negative for rash.  Neurological: Positive for light-headedness. Negative for weakness, numbness and headaches.  Psychiatric/Behavioral: Negative for behavioral problems.    Allergies  Codeine; Ranitidine hcl; Sulfonamide derivatives; and Penicillins  Home Medications   No current outpatient prescriptions on file.  BP 100/71  Pulse 107  Temp(Src) 97.4 F (36.3 C) (Oral)  Resp 14  Ht 5\' 11"  (1.803 m)  Wt 182 lb (82.555 kg)  BMI 25.4 kg/m2  SpO2 99%  Physical Exam  Constitutional: She is oriented to person, place, and time. She appears well-developed.  HENT:  Head: Normocephalic.  Eyes: Pupils are equal, round, and reactive to light.  Cardiovascular:  Tachycardia irregular rhythm   Pulmonary/Chest: Effort normal and breath sounds normal.  CABG scar on chest  Abdominal: Soft. There is no tenderness.  Musculoskeletal: Normal range of motion.  Neurological: She is alert and oriented to person, place, and time.  Skin: There is pallor.  Psychiatric: She has a normal mood and affect.    ED Course  Procedures (including critical care time)  Labs Reviewed  CBC - Abnormal; Notable for the following:    RBC 3.82 (*)    Hemoglobin 11.4 (*)    HCT 32.4 (*)    All other components within normal limits  POCT I-STAT, CHEM 8 - Abnormal; Notable for the following:    Sodium 134 (*)    Potassium 3.3 (*)    Glucose, Bld 151 (*)    Hemoglobin 11.2 (*)    HCT 33.0 (*)    All other components within normal limits  APTT  PROTIME-INR  COMPREHENSIVE METABOLIC PANEL  POCT I-STAT TROPONIN I   No results found.   1. Intermediate coronary syndrome   2. Atrial fibrillation   3. Hypokalemia   4. Hyponatremia      Date: 07/18/2012  Rate: 122  Rhythm: atrial fibrillation  QRS Axis: normal  Intervals: afib  ST/T Wave abnormalities: ST depressions inferiorly and ST depressions laterally  Conduction Disutrbances:none  Narrative Interpretation: Possible ST elevation in aVR.  Old EKG Reviewed: changes noted   Date: 07/18/2012  Rate: 118  Rhythm: atrial fibrillation  QRS Axis: normal  Intervals: afib  ST/T Wave abnormalities: ST depressions inferiorly and ST depressions laterally  Conduction Disutrbances:none  Narrative Interpretation:   Old EKG Reviewed: changes noted  CRITICAL CARE Performed by: Billee Cashing   Total critical care time: 30  Critical care time was exclusive of separately billable procedures and treating other patients.  Critical care was necessary to treat or prevent imminent or life-threatening deterioration.  Critical care was time spent personally by me on the following activities: development of treatment plan with patient and/or  surrogate as well as nursing, discussions with consultants, evaluation of patient's response to treatment, examination of patient, obtaining history from patient or surrogate, ordering and performing treatments and interventions, ordering and review of laboratory studies, ordering and review of radiographic studies, pulse oximetry and re-evaluation of patient's condition.  MDM  Patient came in with chest pain. Diffuse ischemia on EKG with possible elevation in aVR. Code STEMI was called patient has had some hypotension. Fluid boluses were given. met in ER by cardiology waiting for arrival of cath lab team. Serial EKGs were done. Initial troponin was negative an i-STAT was reassuring. Patient was taken emergently to cath lab  Juliet Rude. Rubin Payor, MD 07/18/12 [phone]

## 2012-07-18 NOTE — Progress Notes (Signed)
Chaplain responded to Code STEMI.  Met pt's family in ED and took them to Cath Lab consult 4. Provided ministry of presence and emotional support.  Acted as Print production planner between family and staff during the procedure.   Rutherford Nail Chaplain

## 2012-07-18 NOTE — Interval H&P Note (Signed)
History and Physical Interval Note:  07/18/2012 7:13 PM  [name]  has presented today for surgery, with the diagnosis of Urgent NSTEMI.   The various methods of treatment have been discussed with the patient and family. After consideration of risks, benefits and other options for treatment, the patient has consented to  Procedure(s): LEFT HEART CATHETERIZATION WITH CORONARY ANGIOGRAM (N/A) with possible PCI as a surgical intervention .  The patient's history has been reviewed, patient examined, no change in status, stable for surgery.  I have reviewed the patient's chart and labs.  Questions were answered to the patient's satisfaction.     Vester Balthazor W

## 2012-07-19 DIAGNOSIS — I517 Cardiomegaly: Secondary | ICD-10-CM

## 2012-07-19 LAB — LIPID PANEL
HDL: 39 mg/dL — ABNORMAL LOW (ref >39)
LDL Cholesterol: 62 mg/dL (ref 0–99)
Total CHOL/HDL Ratio: 3.1 RATIO
VLDL: 20 mg/dL (ref 0–40)

## 2012-07-19 LAB — CBC
HCT: 33.7 % — ABNORMAL LOW (ref 36.0–46.0)
Hemoglobin: 11.4 g/dL — ABNORMAL LOW (ref 12.0–15.0)
MCH: 29.6 pg (ref 26.0–34.0)
MCHC: 33.8 g/dL (ref 30.0–36.0)

## 2012-07-19 LAB — BASIC METABOLIC PANEL
BUN: 13 mg/dL (ref 6–23)
Chloride: 100 mEq/L (ref 96–112)
Glucose, Bld: 136 mg/dL — ABNORMAL HIGH (ref 70–99)
Potassium: 3.9 mEq/L (ref 3.5–5.1)

## 2012-07-19 LAB — POCT ACTIVATED CLOTTING TIME: Activated Clotting Time: 453 seconds

## 2012-07-19 LAB — TROPONIN I: Troponin I: <0.3 ng/mL (ref <0.30)

## 2012-07-19 NOTE — Care Management Note (Signed)
    Page 1 of 1   07/19/2012     1:36:08 PM   CARE MANAGEMENT NOTE 07/19/2012  Patient:  MAAYAN, JENNING   Account Number:  [id number]  Date Initiated:  07/19/2012  Documentation initiated by:  Junius Creamer  Subjective/Objective Assessment:   adm w cad     Action/Plan:   lives alone, pcp dr Andrey Campanile elkins   Anticipated DC Date:     Anticipated DC Plan:        DC Planning Services  CM consult      Choice offered to / List presented to:             Status of service:   Medicare Important Message given?   (If response is "NO", the following Medicare IM given date fields will be blank) Date Medicare IM given:   Date Additional Medicare IM given:    Discharge Disposition:    Per UR Regulation:  Reviewed for med. necessity/level of care/duration of stay  If discussed at Long Length of Stay Meetings, dates discussed:    Comments:  3/25 1335 debbie Tessi Eustache rn,bsn

## 2012-07-19 NOTE — Progress Notes (Signed)
  Echocardiogram 2D Echocardiogram has been performed.  Jorje Guild 07/19/2012, 11:48 AM

## 2012-07-19 NOTE — Progress Notes (Signed)
CARDIAC REHAB PHASE I   PRE:  Rate/Rhythm: 64 SR  BP:  Supine:   Sitting: 112/47  Standing:    SaO2: 97 3L  MODE:  Ambulation: 150 ft   POST:  Rate/Rhythm: 70 SR  BP:  Supine: 131/46  Sitting:   Standing:    SaO2: 97 3L 1315-1410  Assisted X 1 used walker and O2 3L to ambulate. Gait steady VS stable. Pt c/o of legs feeling weak with walking. She took two standing rest stops. Pt to bed after walk with call light in reach. Gave pt MI and stent booklets. Pt was DOE with walking with no c/o of cp.  Melina Copa RN 07/19/2012 2:10 PM

## 2012-07-19 NOTE — Progress Notes (Signed)
Subjective:  The patient feels well this morning.  She is sitting up in bed in breakfast.  No chest pain.  Blood pressure stable off Levophed.  Objective:  Vital Signs in the last 24 hours: Temp:  [97.4 F (36.3 C)-98.4 F (36.9 C)] 97.8 F (36.6 C) (03/25 0725) Pulse Rate:  [51-120] 61 (03/25 0725) Resp:  [14-23] 19 (03/25 0725) BP: (82-162)/(29-94) 137/44 mmHg (03/25 0725) SpO2:  [87 %-100 %] 100 % (03/25 0725) Weight:  [182 lb (82.555 kg)-185 lb 6.5 oz (84.1 kg)] 182 lb 12.2 oz (82.9 kg) (03/25 0500)  Intake/Output from previous day: 03/24 0701 - 03/25 0700 In: 1018.7 [P.O.:240; I.V.:778.7] Out: 1600 [Urine:1600] Intake/Output from this shift:    . aspirin  324 mg Oral Once  . aspirin  324 mg Oral NOW   Or  . aspirin  300 mg Rectal NOW  . aspirin EC  81 mg Oral Daily  . atorvastatin  40 mg Oral QHS  . bisoprolol  5 mg Oral Daily  . budesonide-formoterol  2 puff Inhalation BID  . clopidogrel  75 mg Oral Q breakfast  . docusate sodium  100 mg Oral BID  . pantoprazole  40 mg Oral Daily  . senna-docusate  1 tablet Oral QHS  . sodium chloride  3 mL Intravenous Q12H  . sodium chloride  3 mL Intravenous Q12H  . tiotropium  18 mcg Inhalation Daily   . sodium chloride 20 mL (07/18/12 1844)  . norepinephrine (LEVOPHED) Adult infusion 2.5 mcg/min (07/19/12 0400)    Physical Exam: The patient appears to be in no distress.  Head and neck exam reveals that the pupils are equal and reactive.  The extraocular movements are full.  There is no scleral icterus.  Mouth and pharynx are benign.  No lymphadenopathy.  No carotid bruits.  The jugular venous pressure is normal.  Thyroid is not enlarged or tender.  Chest is clear to percussion and auscultation.  No rales or rhonchi.  Expansion of the chest is symmetrical.  Heart reveals no abnormal lift or heave.  First and second heart sounds are normal.  There is no murmur gallop rub or click.  The abdomen is soft and nontender.   Bowel sounds are normoactive.  There is no hepatosplenomegaly or mass.  There are no abdominal bruits.  The right groin is stable without hematoma Extremities reveal no phlebitis or edema.  Pedal pulses are barely palpable. There is no cyanosis or clubbing.  Neurologic exam is normal strength and no lateralizing weakness.  No sensory deficits.  Integument reveals no rash  Lab Results:  Recent Labs  07/18/12 1834 07/18/12 1852 07/19/12 0256  WBC 5.3  --  7.0  HGB 11.4* 11.2* 11.4*  PLT 154  --  193    Recent Labs  07/18/12 1834 07/18/12 1852 07/19/12 0256  NA 134* 134* 136  K 3.3* 3.3* 3.9  CL 95* 97 100  CO2 31  --  26  GLUCOSE 155* 151* 136*  BUN 14 13 13   CREATININE 0.94 1.10 0.94    Recent Labs  07/18/12 2131 07/19/12 0255  TROPONINI <0.30 <0.30   Hepatic Function Panel  Recent Labs  07/18/12 1834  PROT 5.8*  ALBUMIN 3.0*  AST 20  ALT 13  ALKPHOS 63  BILITOT 0.4    Recent Labs  07/19/12 0256  CHOL 121   No results found for this basename: PROTIME,  in the last 72 hours  Imaging: Imaging results have been reviewed  Cardiac Studies: Two-dimensional echocardiogram pending. EKG today shows normal sinus rhythm and is within normal limits, without ischemic changes.  Assessment/Plan:    LOS: 1 day  1. Unstable angina, status post successful PCI by Dr. Herbie Baltimore with bare-metal stent to occluded saphenous vein graft-diagonal. 2. CAD s/p CABG 2005  3. Recurrent PAF with RVR (previously not felt to be coumadin candidate due to GIB/AVMs) now back in normal sinus rhythm. 4. COPD on home O2  5. Hypotension, improved since PCI 6. Chronic diastolic HF  7. Anemia  8. Hyponatremia, chronic  9. Hypokalemia  Plan: Dual antiplatelet therapy for minimum of one month. Two-dimensional echocardiogram today to evaluate mitral regurgitation seen on LV gram.  Cassell Clement 07/19/2012, 9:19 AM

## 2012-07-20 DIAGNOSIS — I5032 Chronic diastolic (congestive) heart failure: Secondary | ICD-10-CM | POA: Diagnosis present

## 2012-07-20 DIAGNOSIS — D649 Anemia, unspecified: Secondary | ICD-10-CM

## 2012-07-20 DIAGNOSIS — I959 Hypotension, unspecified: Secondary | ICD-10-CM

## 2012-07-20 NOTE — Progress Notes (Signed)
CARDIAC REHAB PHASE I   PRE:  Rate/Rhythm: 69SR  BP:  Supine:   Sitting: 117/48  Standing:    SaO2: 98%2L  MODE:  Ambulation: 250 ft   POST:  Rate/Rhythm: 77SR  BP:  Supine:   Sitting: 138/50  Standing:    SaO2: 96%3L 1336-1413 Pt walked 250 ft on 3L with rolling walker and asst x 1. Gait steady but had to stop several times to rest due to leg pain and SOB. Pt states she does not get her mail. Mainly walks in house. Has walkers at home for use. To recliner after walk. Call bell in reach. Education on NTG use and calling 911,encouraged walking as tolerated, and left heart healthy diet for pt to read. Pt states she will think about CRP 2 but wants to see how she does at home before agreeing to referral.. Left brochure for pt. Transportation may also be a problem for pt.   Luetta Nutting, RN BSN  07/20/2012 2:09 PM

## 2012-07-20 NOTE — Progress Notes (Signed)
Patient ID: [name], female   DOB: Feb 12, 1930, 79 y.o.   MRN: [id number]   SUBJECTIVE:  Echo done yesterday showed ejection fraction 55-60%. There was no significant mitral regurgitation. Her blood pressure has stabilized. She is feeling much better. The patient has multitude of medical problems including her severe lung disease. She is getting stronger but she will need further ambulation in the hospital before she can go home.   Filed Vitals:   07/20/12 0400 07/20/12 0500 07/20/12 0600 07/20/12 0730  BP: 104/33 114/34 109/33 153/55  Pulse: 64 69 62 63  Temp:    97.8 F (36.6 C)  TempSrc:    Oral  Resp: 16 19 18 12   Height:      Weight:  184 lb 15.5 oz (83.9 kg)    SpO2: 100% 100% 100% 99%    Intake/Output Summary (Last 24 hours) at 07/20/12 0750 Last data filed at 07/20/12 0600  Gross per 24 hour  Intake  765.9 ml  Output   1350 ml  Net -584.1 ml    LABS: Basic Metabolic Panel:  Recent Labs  40/98/11 1834 07/18/12 1852 07/18/12 2132 07/19/12 0256  NA 134* 134*  --  136  K 3.3* 3.3*  --  3.9  CL 95* 97  --  100  CO2 31  --   --  26  GLUCOSE 155* 151*  --  136*  BUN 14 13  --  13  CREATININE 0.94 1.10  --  0.94  CALCIUM 9.6  --   --  9.2  MG  --   --  1.8  --    Liver Function Tests:  Recent Labs  07/18/12 1834  AST 20  ALT 13  ALKPHOS 63  BILITOT 0.4  PROT 5.8*  ALBUMIN 3.0*   No results found for this basename: LIPASE, AMYLASE,  in the last 72 hours CBC:  Recent Labs  07/18/12 1834 07/18/12 1852 07/19/12 0256  WBC 5.3  --  7.0  HGB 11.4* 11.2* 11.4*  HCT 32.4* 33.0* 33.7*  MCV 84.8  --  87.5  PLT 154  --  193   Cardiac Enzymes:  Recent Labs  07/18/12 2131 07/19/12 0255  TROPONINI <0.30 <0.30   BNP: No components found with this basename: POCBNP,  D-Dimer: No results found for this basename: DDIMER,  in the last 72 hours Hemoglobin A1C:  Recent Labs  07/18/12 2132  HGBA1C 5.9*   Fasting Lipid Panel:  Recent Labs  07/19/12 0256  CHOL 121  HDL 39*  LDLCALC 62  TRIG 914  CHOLHDL 3.1   Thyroid Function Tests:  Recent Labs  07/18/12 2132  TSH 0.942    RADIOLOGY: No results found.  PHYSICAL EXAM   Patient is oriented to person time and place. Affect is normal. There is no jugulovenous distention. Lungs reveal decreased breath sounds with a few scattered rhonchi. There is no respiratory distress. Abdomen is soft. There is no peripheral edema.   TELEMETRY:  I have reviewed telemetry today July 20, 2012. There is sinus rhythm.   ASSESSMENT AND PLAN:    Hyponatremia        Historically this has been a chronic problem. Sodium is in the normal range at this time.    COPD (chronic obstructive pulmonary disease)    Hx of CABG    Ejection fraction         Ejection fraction by echo yesterday was 55-60%. There were no definite focal wall motion abnormalities.  Hypokalemia         Potassium was back in the normal range yesterday.    Atrial fibrillation      The patient had some atrial fib but had converted back to sinus.    Intermediate coronary syndrome       The patient presented with unstable cardiac symptoms. Patient underwent PCI to a vein graft to the diagonal. Afterwards there was some hypotension that improved. With her multiple medical problems she needs to be ambulated before she can go home.    Chronic diastolic CHF (congestive heart failure)      Her volume status is stable at this time. No change in therapy.      Hypotension        Hypotension has improved.    Anemia     Hemoglobin is stable at 11.4.    Mitral regurgitation      There was some mitral regurgitation in the Cath Lab. Echo yesterday revealed no significant mitral regurgitation.  The overall plan will be to transfer her out of the coronary care unit. She is to be ambulated until she is strong enough to go home. Hopefully this will be in 24-48 hours.   Willa Rough 07/20/2012 7:50 AM

## 2012-07-21 LAB — CBC
Hemoglobin: 10.6 g/dL — ABNORMAL LOW (ref 12.0–15.0)
MCH: 29.4 pg (ref 26.0–34.0)
MCHC: 33.3 g/dL (ref 30.0–36.0)
MCV: 88.3 fL (ref 78.0–100.0)
RBC: 3.6 MIL/uL — ABNORMAL LOW (ref 3.87–5.11)

## 2012-07-21 LAB — BASIC METABOLIC PANEL
BUN: 10 mg/dL (ref 6–23)
CO2: 32 mEq/L (ref 19–32)
Calcium: 9.6 mg/dL (ref 8.4–10.5)
Creatinine, Ser: 0.88 mg/dL (ref 0.50–1.10)
GFR calc non Af Amer: 60 mL/min — ABNORMAL LOW (ref >90)
Glucose, Bld: 119 mg/dL — ABNORMAL HIGH (ref 70–99)

## 2012-07-21 MED ORDER — LOSARTAN POTASSIUM 50 MG PO TABS
50.0000 mg | ORAL_TABLET | Freq: Every day | ORAL | Status: DC
  Administered 2012-07-21 – 2012-07-22 (×2): 50 mg via ORAL
  Filled 2012-07-21 (×2): qty 1

## 2012-07-21 MED ORDER — POTASSIUM CHLORIDE CRYS ER 20 MEQ PO TBCR
40.0000 meq | EXTENDED_RELEASE_TABLET | Freq: Once | ORAL | Status: AC
  Administered 2012-07-21: 40 meq via ORAL
  Filled 2012-07-21: qty 2

## 2012-07-21 NOTE — Progress Notes (Signed)
Subjective:  No chest pain.  Still feels very weak in legs when she tries to walk. Rhythm is NSR. BP trending higher.  She is not yet back on her ARB.  Was also on diltiazem at home. LV function good on echo EF 55-60 %.  Objective:  Vital Signs in the last 24 hours: Temp:  [97.6 F (36.4 C)-98.7 F (37.1 C)] 97.6 F (36.4 C) (03/27 0432) Pulse Rate:  [60-68] 65 (03/27 0432) Resp:  [16-19] 18 (03/26 2059) BP: (138-168)/(50-68) 150/61 mmHg (03/27 0432) SpO2:  [96 %-100 %] 96 % (03/27 0823) Weight:  [187 lb 4.8 oz (84.959 kg)] 187 lb 4.8 oz (84.959 kg) (03/27 0432)  Intake/Output from previous day: 03/26 0701 - 03/27 0700 In: 1080 [P.O.:1080] Out: 350 [Urine:350] Intake/Output from this shift:    . aspirin EC  81 mg Oral Daily  . atorvastatin  40 mg Oral QHS  . bisoprolol  5 mg Oral Daily  . budesonide-formoterol  2 puff Inhalation BID  . clopidogrel  75 mg Oral Q breakfast  . docusate sodium  100 mg Oral BID  . pantoprazole  40 mg Oral Daily  . senna-docusate  1 tablet Oral QHS  . sodium chloride  3 mL Intravenous Q12H  . sodium chloride  3 mL Intravenous Q12H  . tiotropium  18 mcg Inhalation Daily      Physical Exam: The patient appears to be in no distress.  Head and neck exam reveals that the pupils are equal and reactive.  The extraocular movements are full.  There is no scleral icterus.  Mouth and pharynx are benign.  No lymphadenopathy.  No carotid bruits.  The jugular venous pressure is normal.  Thyroid is not enlarged or tender.  Chest is clear to percussion and auscultation.  No rales or rhonchi.  Expansion of the chest is symmetrical.  Heart reveals no abnormal lift or heave.  First and second heart sounds are normal.  There is no murmur gallop rub or click.  The abdomen is soft and nontender.  Bowel sounds are normoactive.  There is no hepatosplenomegaly or mass.  There are no abdominal bruits.  Extremities reveal no phlebitis or edema.  Pedal pulses are  good.  There is no cyanosis or clubbing.  Neurologic exam is normal strength and no lateralizing weakness.  No sensory deficits.  Integument reveals no rash  Lab Results:  Recent Labs  07/18/12 1834 07/18/12 1852 07/19/12 0256  WBC 5.3  --  7.0  HGB 11.4* 11.2* 11.4*  PLT 154  --  193    Recent Labs  07/18/12 1834 07/18/12 1852 07/19/12 0256  NA 134* 134* 136  K 3.3* 3.3* 3.9  CL 95* 97 100  CO2 31  --  26  GLUCOSE 155* 151* 136*  BUN 14 13 13   CREATININE 0.94 1.10 0.94    Recent Labs  07/18/12 2131 07/19/12 0255  TROPONINI <0.30 <0.30   Hepatic Function Panel  Recent Labs  07/18/12 1834  PROT 5.8*  ALBUMIN 3.0*  AST 20  ALT 13  ALKPHOS 63  BILITOT 0.4    Recent Labs  07/19/12 0256  CHOL 121   No results found for this basename: PROTIME,  in the last 72 hours  Imaging: Imaging results have been reviewed  Cardiac Studies: Telemetry shows NSR Assessment/Plan:  1. Unstable angina, status post successful PCI by Dr. Herbie Baltimore with bare-metal stent to occluded saphenous vein graft-diagonal.  2. CAD s/p CABG 2005  3. Recurrent  PAF with RVR (previously not felt to be coumadin candidate due to GIB/AVMs) now back in normal sinus rhythm.  4. COPD on home O2  5. Essential hypertension. 6. Chronic diastolic HF  7. Anemia  8. Hyponatremia, chronic, improving  9. Hypokalemia, resolved  Plan: Patient still too weak to go home. Continue cardiac rehab here in hospital. Anticipate home Friday. Will recheck BMET and CBC today. Restart Losartan today.   LOS: 3 days    Cassell Clement 07/21/2012, 9:40 AM

## 2012-07-21 NOTE — Progress Notes (Signed)
CARDIAC REHAB PHASE I   PRE:  Rate/Rhythm: 68SR  BP:  Supine: 135/70  Sitting:   Standing:    SaO2: 96% 21/4 L  MODE:  Ambulation: 340 ft   POST:  Rate/Rhythm: 73  BP:  Supine:   Sitting: 165/74  Standing:    SaO2: 94%3L 1015-1050 Pt walked 340 ft on 3L with rolling walker and asst x 1. Pt stopped frequently to rest due to leg weakness and SOB. Pt very tired by end of walk. To recliner with call bell. Put back on previous oxygen for room.   Luetta Nutting, RN BSN  07/21/2012 10:47 AM

## 2012-07-22 ENCOUNTER — Encounter (HOSPITAL_COMMUNITY): Payer: Self-pay | Admitting: Physician Assistant

## 2012-07-22 ENCOUNTER — Telehealth: Payer: Self-pay | Admitting: Cardiology

## 2012-07-22 LAB — BASIC METABOLIC PANEL
BUN: 9 mg/dL (ref 6–23)
Calcium: 9.8 mg/dL (ref 8.4–10.5)
Creatinine, Ser: 0.88 mg/dL (ref 0.50–1.10)
GFR calc Af Amer: 70 mL/min — ABNORMAL LOW (ref >90)
GFR calc non Af Amer: 60 mL/min — ABNORMAL LOW (ref >90)
Glucose, Bld: 128 mg/dL — ABNORMAL HIGH (ref 70–99)
Potassium: 3.5 mEq/L (ref 3.5–5.1)

## 2012-07-22 MED ORDER — CLOPIDOGREL BISULFATE 75 MG PO TABS: 75.0000 mg | ORAL_TABLET | Freq: Every day | ORAL | Status: DC

## 2012-07-22 MED ORDER — ATORVASTATIN CALCIUM 40 MG PO TABS: 40.0000 mg | ORAL_TABLET | Freq: Every day | ORAL | Status: DC

## 2012-07-22 NOTE — Discharge Summary (Signed)
Discharge Summary   Patient ID: [name],  MRN: [id number], DOB/AGE: 29-Jul-1929 79 y.o.  Admit date: 07/18/2012 Discharge date: 07/22/2012  Primary Physician: Kaleen Mask, MD Primary Cardiologist: Lovena Neighbours, MD  Discharge Diagnoses Principal Problem:   Intermediate coronary syndrome Active Problems:   CAD (coronary artery disease)   Chronic respiratory failure   Atrial fibrillation   Chronic diastolic CHF (congestive heart failure)   Hypertension   Hyperlipidemia   Hyponatremia   COPD (chronic obstructive pulmonary disease)   Tobacco abuse   Hx of CABG   Ejection fraction   Hypokalemia   Hypotension   Anemia   Allergies Allergies  Allergen Reactions  . Codeine Other (See Comments)    unknown  . Ranitidine Hcl Other (See Comments)    unknown  . Sulfonamide Derivatives Nausea And Vomiting  . Penicillins Rash and Other (See Comments)    unknown    Diagnostic Studies/Procedures  CARDIAC CATHETERIZATION + PERCUTANEOUS CORONARY INTERVENTION - 07/18/12  Findings:  Hemodynamics:  Central Aortic / Mean Pressures: 106/64 mmHg; 82 mmHg; final pressure 134/62 mmHg; 88 mmHg  Left Ventricular Pressures / EDP: 110/9 mmHg; 21 mmHg  Left Ventriculography:  EF: 65-70 %  Wall Motion: Normal, 3+ MR  Coronary Anatomy:  Left Main: large-caliber vessel with proximal 40% stenosis., Bifurcates into circumflex and LAD. LAD: Large-caliber vessel with a proximal 80% long tubular stenosis. It gives off a proximal first diagonal branch that is small and angiographically normal. The second diagonal branch appeared to be grafted with backfilling dye into the graft but not reaching the ring. There is however reasonable flow down the native D2 that has proximal tubular 60-70% lesions. The distal LAD as competitive flow as it wraps around the apex suggestive of a patent LIMA.  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 This would require  left radial access for actually angiography.  SVG-D2: Large-caliber graft with a subtotal, 99% irregular occlusion followed by the remainder the graft being essentially normal. There was dye hangup in the distal portion of the graft that cleared after balloon angioplasty. Left Circumflex: probably moderate large-caliber vessel with severe ostial stenosis of roughly 90%. The vessel then gives off and OM1 which has competitive flow as well as the AV groove circumflex which tapers to be excluded. There is a 6 suggestion of competitive flow in distal occluded portion.  SVG-OM1-OM 2 widely patent graft with retrograde flow from both branches that did not quite reach the AV groove circumflex.  OM1: Filled via proximal limb of SVG; minimal luminal irregularities  OM 2: filled via sequential limb of SVG; minimal luminal irregularities RCA: Large-caliber vessel with ostial 30% stenosis followed by diffuse lesions ranging from 80% proximal long tubular 90% mid then his sense occluded in the distal portion prior to bifurcation.  SVG-RPDA: Widely patent, large-caliber graft.  RPDA: fills antegrade via SVG. Diffuse, mild luminal irregularities.  RPL Sysytem:The RPAV fills via retrograde flow from SVG-RPDA. As a moderate caliber vessel gives off 3 posterior lateral branches with diffuse, mild luminal irregularities.  After reviewing the preliminary angiography, the clear-cut culprit lesion is the proximal SVG-D2. As it did appear to be a channel through the stenosis, the decision made to attempt PCI. Angiomax bolus was administered along with 600 mg oral clopidogrel.   Percutaneous Coronary Intervention:  Guide: 6 Fr AL-1 Guidewire: BMW -- initially, the wire was unable to pass. Was successfully passed with balloon support.  Predilation Balloon: Emerge MR 2.0  mm x 12 mm; multiple inflations from distal proximal  6 Atm x 30 Sec, 8 Atm x 30 Sec,8 Atm x 30 Sec Stent: Veriflex BMS 3.0 mm x 20 mm; 16 Atm x 30 Sec,    Post-dilation with stent Balloon: 14 mm x 60 mm;  Final Diameter: 3.4 mm  Post deployment angiography in multiple views, with and without guidewire in place revealed excellent stent deployment and lesion coverage. There was no evidence of dissection or perforation. Brisk TIMI 3 flow was restored, with no evidence of no reflow.   PATIENT DISPOSITION:  The patient was transferred to the PACU holding area in a hemodynamicaly stable, chest pain free condition.  The patient tolerated the procedure well, and there were no complications. EBL: < 10 ml  The patient was stable before, during, and after the procedure. POST-OPERATIVE DIAGNOSIS:  Severe native coronary disease as described.  Severe proximal stenosis of the SVG-D2: Subtotal, 99% occlusion  Successful PTCA/PCI of the possible SVG-D2 with a Veriflex BMS 3.0 mm x 20 mm, postdilated to 3.4 mm Widely patent SVGs to both the RPDA and sequential to OM1-OM 2 Well-preserved EF by LV gram however there is evidence of significant MR.  Successful arteriotomy closure with Mynx closure device  2D ECHOCARDIOGRAM - 07/19/12  - Left ventricle: The cavity size was normal. Wall thickness was normal. Systolic function was normal. The estimated ejection fraction was in the range of 55% to 60%. Wall motion was normal; there were no regional wall motion abnormalities. - Left atrium: The atrium was mildly dilated. - Right atrium: The atrium was mildly dilated. - Pulmonary arteries: Systolic pressure was mildly increased. PA peak pressure: 42mm Hg (S).   History of Present Illness  [name] is a 79 y.o. female who was admitted to [facility] on 07/18/12 with the above problem list.  She is a complex past medical history including O2 dependent COPD, paroxysmal A. fib (not on Coumadin due to history of GI bleed/arterial venous malformations), chronic diastolic heart failure and history of CAD status post CABG in 2005.   Last cath in 4/ 2005  several months after her MI. 3V CAD with patent SVG to diagonal, patent SVG to OM1 sequential to OM2, SVG to PDA. 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  She reported requiring a stomach virus over the weekend and felt fatigued. Around 3:30 to 40 p.m. the afternoon of admission she developed acute onset chest pain radiating to left arm with associated nausea and vomiting. The chest pain persisted and EMS was called. She received a full dose aspirin and sublingual after glycerin without relief.  EKG in the ED indicated atrial fibrillation with RVR, rate 120, with diffuse severe ST depression in an isolated ST elevation in aVR. An initial trop-I was WNL. The patient's chest pain persisted in the ER, rated at a 5/10 with associated nausea. Blood pressures were found to be labile (SBP 90s). She was given Lopressor 2.5 mg IV and started on heparin drip. Her systolic blood pressure dropped further to 60 and low-dose Levophen was started with blood pressure stabilization. Decision was made to proceed for an emergent diagnostic cardiac catheterization. She denied any active bleeding.  Hospital Course  The risks of the procedure were explained to the patient who wished to proceed. She was informed, consented and prepped for cardiac catheterization which was accessed via the right femoral artery. As above, this revealed severe native coronary artery disease, 99% proximal stenosis of  the SVG-D2 status post successful PTCA/PCI with a Veriflex bare-metal stent, widely patent SVGs to both the right PDA sequential OM1 to OM 2, EF 65-70% and 3+ mitral regurgitation. Patient tolerated the procedure well without consultations. Blood pressure remained stable. Levophed was discontinued. She was transferred to CCU in good condition. The recommendation was made to continue DAPT for a minimum of one month.   She remained asymptomatic and normotensive throughout the night and the following morning. She  underwent a 2D echo as above which revealed EF 55-60%, mild biatrial enlargement and PASP 42 mmHg. of note, there is no evidence of mitral regurgitation. She ambulated gradually with cardiac rehabilitation complaining of mild weakness and dyspnea on exertion but no endorsement of chest pain. Two subsequent sets of trop-I returned WNL. She eventually converted back to normal sinus rhythm. The decision was made not to pursue anticoagulation given her history of GI bleeding in the past. Outpatient home medications including bisoprolol and ARB were restarted as her blood pressure improved. She was noted to have a mild normocytic anemia which remained stable. She also developed a mild hypokalemia which was successfully repleted. Hyponatremia was also noted on lab work, which she described as a chronic problem and eventually improved throughout the course of her admission.   She was eventually transferred from CCU to telemetry, and evaluated by Dr. Myrtis Ser this morning and deemed her stable for discharge. She does have severe native CAD and paroxysmal atrial fibrillation, however given her history of GI bleeding, the decision was made to pursue dual antiplatelet therapy for one month only. She'll resume aspirin thereafter. She'll be discharged on the medication regimen outlined below. She will followup in the office as documented below. This information, including post cath instructions and activity she returns, has been clearly outlined in the discharge AVS.  Discharge Vitals:  Blood pressure 140/70, pulse 69, temperature 97.4 F (36.3 C), temperature source Axillary, resp. rate 18, height 5\' 11"  (1.803 m), weight 83.416 kg (183 lb 14.4 oz), SpO2 98.00%.   Labs: Recent Labs     07/21/12  1010  WBC  6.4  HGB  10.6*  HCT  31.8*  MCV  88.3  PLT  172   Recent Labs Lab 07/18/12 1834  07/19/12 0256 07/21/12 1010 07/22/12 0537  NA 134*  < > 136 137 137  K 3.3*  < > 3.9 3.4* 3.5  CL 95*  < > 100 99 99    CO2 31  --  26 32 30  BUN 14  < > 13 10 9   CREATININE 0.94  < > 0.94 0.88 0.88  CALCIUM 9.6  --  9.2 9.6 9.8  PROT 5.8*  --   --   --   --   BILITOT 0.4  --   --   --   --   ALKPHOS 63  --   --   --   --   ALT 13  --   --   --   --   AST 20  --   --   --   --   GLUCOSE 155*  < > 136* 119* 128*  < > = values in this interval not displayed.  Disposition:  Discharge Orders   Future Appointments Provider Department Dept Phone   08/01/2012 9:50 AM Beatrice Lecher, PA-C [facility] Main Office [place]) [phone]   Future Orders Complete By Expires     Diet - low sodium heart healthy  As directed  Increase activity slowly  As directed           Follow-up Information   Follow up with Tereso Newcomer, PA-C On 08/01/2012. (At 9:50 AM for follow-up. )    Contact information:   [address] [place] [address] [phone]      Discharge Medications:    Medication List    TAKE these medications       albuterol 108 (90 BASE) MCG/ACT inhaler  Commonly known as:  PROVENTIL HFA;VENTOLIN HFA  Inhale 2 puffs into the lungs every 6 (six) hours as needed for wheezing.     aspirin EC 81 MG tablet  Take 81 mg by mouth daily.     atorvastatin 40 MG tablet  Commonly known as:  LIPITOR  Take 1 tablet (40 mg total) by mouth at bedtime.     bisoprolol 10 MG tablet  Commonly known as:  ZEBETA  Take 10 mg by mouth daily.     budesonide-formoterol 160-4.5 MCG/ACT inhaler  Commonly known as:  SYMBICORT  Inhale 2 puffs into the lungs 2 (two) times daily.     clopidogrel 75 MG tablet  Commonly known as:  PLAVIX  Take 1 tablet (75 mg total) by mouth daily with breakfast.     diltiazem 240 MG 24 hr capsule  Commonly known as:  CARDIZEM CD  Take 240 mg by mouth daily.     docusate sodium 100 MG capsule  Commonly known as:  COLACE  Take 100 mg by mouth 2 (two) times daily.     furosemide 40 MG tablet  Commonly known as:  LASIX  Take 40 mg by mouth daily.      hydroxypropyl methylcellulose 2.5 % ophthalmic solution  Commonly known as:  ISOPTO TEARS  Place 1 drop into both eyes daily as needed (for dry eyes).     ICAPS PO  Take 1 tablet by mouth daily.     IPRATROPIUM BROMIDE NA  Place 2 sprays into the nose daily as needed (for wheezing).     losartan 50 MG tablet  Commonly known as:  COZAAR  Take 50 mg by mouth daily.     nitroGLYCERIN 0.4 MG SL tablet  Commonly known as:  NITROSTAT  Place 0.4 mg under the tongue every 5 (five) minutes as needed for chest pain.     pantoprazole 40 MG tablet  Commonly known as:  PROTONIX  Take 40 mg by mouth daily.     potassium chloride SA 20 MEQ tablet  Commonly known as:  K-DUR,KLOR-CON  Take 20 mEq by mouth 2 (two) times daily.     sennosides-docusate sodium 8.6-50 MG tablet  Commonly known as:  SENOKOT-S  Take 1 tablet by mouth daily.     tiotropium 18 MCG inhalation capsule  Commonly known as:  SPIRIVA  Place 18 mcg into inhaler and inhale daily.     Vitamin D (Ergocalciferol) 50000 UNITS Caps  Commonly known as:  DRISDOL  Take 50,000 Units by mouth every 14 (fourteen) days.       Outstanding Labs/Studies: None  Duration of Discharge Encounter: Greater than 30 minutes including physician time.  Signed, R. Hurman Horn, PA-C 07/22/2012, 12:47 PM  Patient seen and examined. I agree with the assessment and plan as detailed above. See also my additional thoughts below.   I saw the patient today and make a decision for her discharge. I reviewed all the information in the note above. She is ready to go  home. Please refer to my progress note from today also  Willa Rough, MD, [facility] 07/22/2012 12:57 PM

## 2012-07-22 NOTE — Progress Notes (Signed)
Patient ID: [name], female   DOB: 09-17-1929, 79 y.o.   MRN: [id number]   SUBJECTIVE: The patient is feeling better and getting stronger. She will be ready to go home this afternoon.   Filed Vitals:   07/21/12 2126 07/22/12 0701 07/22/12 0924 07/22/12 1028  BP: 183/68 148/65  140/70  Pulse: 67 63  69  Temp: 97.9 F (36.6 C) 97.4 F (36.3 C)    TempSrc: Oral Axillary    Resp:      Height:      Weight:  183 lb 14.4 oz (83.416 kg)    SpO2: 100% 100% 98%     Intake/Output Summary (Last 24 hours) at 07/22/12 1049 Last data filed at 07/22/12 0825  Gross per 24 hour  Intake    855 ml  Output      0 ml  Net    855 ml    LABS: Basic Metabolic Panel:  Recent Labs  82/95/62 1010 07/22/12 0537  NA 137 137  K 3.4* 3.5  CL 99 99  CO2 32 30  GLUCOSE 119* 128*  BUN 10 9  CREATININE 0.88 0.88  CALCIUM 9.6 9.8   Liver Function Tests: No results found for this basename: AST, ALT, ALKPHOS, BILITOT, PROT, ALBUMIN,  in the last 72 hours No results found for this basename: LIPASE, AMYLASE,  in the last 72 hours CBC:  Recent Labs  07/21/12 1010  WBC 6.4  HGB 10.6*  HCT 31.8*  MCV 88.3  PLT 172   Cardiac Enzymes: No results found for this basename: CKTOTAL, CKMB, CKMBINDEX, TROPONINI,  in the last 72 hours BNP: No components found with this basename: POCBNP,  D-Dimer: No results found for this basename: DDIMER,  in the last 72 hours Hemoglobin A1C: No results found for this basename: HGBA1C,  in the last 72 hours Fasting Lipid Panel: No results found for this basename: CHOL, HDL, LDLCALC, TRIG, CHOLHDL, LDLDIRECT,  in the last 72 hours Thyroid Function Tests: No results found for this basename: TSH, T4TOTAL, FREET3, T3FREE, THYROIDAB,  in the last 72 hours  RADIOLOGY: No results found.  PHYSICAL EXAM   Patient is comfortable in the room. Lungs reveal decreased breath sounds. There few scattered rhonchi. There is no jugulovenous distention. Cardiac exam reveals  S1 and S2. The abdomen is soft. There is no peripheral edema.   TELEMETRY:  I have reviewed telemetry today July 22, 2012. There is normal sinus rhythm.   ASSESSMENT AND PLAN:      COPD (chronic obstructive pulmonary disease)       She has significant COPD. However it is stable at this time.    Ejection fraction      EF was normal with the echo done this admission.    Hypokalemia      Potassium is low normal range.    Atrial fibrillation     She had brief atrial fib but is now holding sinus rhythm. No change in therapy.    Intermediate coronary syndrome      The patient did require PCI. She is stable. She had some hypotension early after the procedure but did stabilize. No further workup.    Chronic diastolic CHF (congestive heart failure)      Her volume status has remained stable. No change in therapy.  The patient is stable. She will be discharged home today.   Willa Rough 07/22/2012 10:49 AM

## 2012-07-22 NOTE — Progress Notes (Signed)
Pt discharged to home per MD order.  Pt received and reviewed all discharge instructions and medication including follow-up appointments and prescriptions. Pt verbalized understanding.  Pt alert and oriented at discharge with no complaints of pain.  Pt escorted to private vehicle via wheelchair by guest services. [name]

## 2012-07-22 NOTE — Telephone Encounter (Signed)
New problem    toc appt is [facility] on  4/7.

## 2012-07-25 ENCOUNTER — Emergency Department (HOSPITAL_COMMUNITY)
Admission: EM | Admit: 2012-07-25 | Discharge: 2012-07-26 | Disposition: A | Discharge status: Home / Self Care | Payer: Medicare Other | Attending: Emergency Medicine | Admitting: Emergency Medicine

## 2012-07-25 ENCOUNTER — Encounter (HOSPITAL_COMMUNITY): Payer: Self-pay | Admitting: Emergency Medicine

## 2012-07-25 DIAGNOSIS — T148XXA Other injury of unspecified body region, initial encounter: Secondary | ICD-10-CM

## 2012-07-25 DIAGNOSIS — Z8719 Personal history of other diseases of the digestive system: Secondary | ICD-10-CM | POA: Insufficient documentation

## 2012-07-25 DIAGNOSIS — J4489 Other specified chronic obstructive pulmonary disease: Secondary | ICD-10-CM | POA: Insufficient documentation

## 2012-07-25 DIAGNOSIS — Z8679 Personal history of other diseases of the circulatory system: Secondary | ICD-10-CM | POA: Insufficient documentation

## 2012-07-25 DIAGNOSIS — Z8669 Personal history of other diseases of the nervous system and sense organs: Secondary | ICD-10-CM | POA: Insufficient documentation

## 2012-07-25 DIAGNOSIS — Z7902 Long term (current) use of antithrombotics/antiplatelets: Secondary | ICD-10-CM | POA: Insufficient documentation

## 2012-07-25 DIAGNOSIS — Z9981 Dependence on supplemental oxygen: Secondary | ICD-10-CM | POA: Insufficient documentation

## 2012-07-25 DIAGNOSIS — Y838 Other surgical procedures as the cause of abnormal reaction of the patient, or of later complication, without mention of misadventure at the time of the procedure: Secondary | ICD-10-CM | POA: Insufficient documentation

## 2012-07-25 DIAGNOSIS — Z87891 Personal history of nicotine dependence: Secondary | ICD-10-CM | POA: Insufficient documentation

## 2012-07-25 DIAGNOSIS — I5030 Unspecified diastolic (congestive) heart failure: Secondary | ICD-10-CM | POA: Insufficient documentation

## 2012-07-25 DIAGNOSIS — E785 Hyperlipidemia, unspecified: Secondary | ICD-10-CM | POA: Insufficient documentation

## 2012-07-25 DIAGNOSIS — Z9861 Coronary angioplasty status: Secondary | ICD-10-CM | POA: Insufficient documentation

## 2012-07-25 DIAGNOSIS — Z79899 Other long term (current) drug therapy: Secondary | ICD-10-CM | POA: Insufficient documentation

## 2012-07-25 DIAGNOSIS — Z8611 Personal history of tuberculosis: Secondary | ICD-10-CM | POA: Insufficient documentation

## 2012-07-25 DIAGNOSIS — J449 Chronic obstructive pulmonary disease, unspecified: Secondary | ICD-10-CM | POA: Insufficient documentation

## 2012-07-25 DIAGNOSIS — Z8639 Personal history of other endocrine, nutritional and metabolic disease: Secondary | ICD-10-CM | POA: Insufficient documentation

## 2012-07-25 DIAGNOSIS — S301XXA Contusion of abdominal wall, initial encounter: Secondary | ICD-10-CM | POA: Insufficient documentation

## 2012-07-25 DIAGNOSIS — Z7982 Long term (current) use of aspirin: Secondary | ICD-10-CM | POA: Insufficient documentation

## 2012-07-25 DIAGNOSIS — I2510 Atherosclerotic heart disease of native coronary artery without angina pectoris: Secondary | ICD-10-CM | POA: Insufficient documentation

## 2012-07-25 DIAGNOSIS — Z951 Presence of aortocoronary bypass graft: Secondary | ICD-10-CM | POA: Insufficient documentation

## 2012-07-25 DIAGNOSIS — I10 Essential (primary) hypertension: Secondary | ICD-10-CM | POA: Insufficient documentation

## 2012-07-25 DIAGNOSIS — Z862 Personal history of diseases of the blood and blood-forming organs and certain disorders involving the immune mechanism: Secondary | ICD-10-CM | POA: Insufficient documentation

## 2012-07-25 NOTE — ED Provider Notes (Signed)
History  This chart was scribed for Benny Lennert, MD by Shari Heritage, ED Scribe. The patient was seen in room A01C/A01C. Patient's care was started at 2302.  CSN: [id number]  Arrival date & time 07/25/12  2143   First MD Initiated Contact with Patient 07/25/12 2302      Chief Complaint  Patient presents with  . Post-op Problem     The history is provided by the patient. No language interpreter was used.    HPI Comments: [name] is a 79 y.o. female with history of coronary artery disease, hypertension, aortic aneurysm, COPD, congestive heart failure, atrial fibrillation who presents to the Emergency Department complaining of a moderate post-operative pain and swelling at her cardiac catheterization site in the right femoral area that began a few hours ago. Patient had the cardiac cath procedure 1 week ago with . She is now complaining of increased pain at the insertion site and states that she has noticed a "knot" there. She denies any bleeding or drainage from the site. There is no chest pain or new shortness of breath. She reports baseline shortness of breath secondary to COPD. Patient is currently on Plavix.   Past Medical History  Diagnosis Date  . CAD (coronary artery disease)     a. s/p CABG x5 (2005) b. s/p BMS-prox SVG-D2 (2014)  . Hypertension   . Hyperlipidemia   . GI bleed     AVMs  . Aneurysm, aortic     thoracic aorta, stable at 4.1 cm, chest CT, May, 2012  . Syncope     Nitroglycerin plus a diuretic, April, 2009  . Hyponatremia     Chronic. Felt secondary to SIADH   . COPD (chronic obstructive pulmonary disease)   . Tobacco abuse   . Bradycardia   . Carotid artery disease     Hx of endarterectomy. Doppler October, 2011, stable, 0-39% RIC A., 40-59% LICA  . Tuberculosis     Exposures to tuberculosis 1970s, has tested negative by the health Department  . Atrial fibrillation     Paroxysmal with RVR 07/2011, not felt to be a coumadin candidate  secondary to hx of GIB/AVM  . Venous stasis of lower extremity     Chronic  . Ejection fraction     a. EF 55-60%, echo, April, 2013 b. EF 55-60%, mild biatrial enlargement and PASP 42 mmH  . CHF with left ventricular diastolic dysfunction, NYHA class 2   . AAA (abdominal aortic aneurysm)   . On home O2     2L N/C  . Complication of anesthesia   . Shortness of breath     Past Surgical History  Procedure Laterality Date  . Cholecystectomy    . Carotid endartercetomy    . Abdominal hysterectomy    . Coronary artery bypass graft  2005  . Coronary angioplasty with stent placement  07/18/12    severe native coronary artery disease, 99% proximal stenosis of the SVG-D2 status post successful PTCA/PCI with a Veriflex bare-metal stent, widely patent SVGs to both the right PDA sequential OM1 to OM 2, EF 65-70% and 3+ mitral regurgitation    Family History  Problem Relation Age of Onset  . Stroke Sister   . Heart failure Mother   . Cancer Sister     Breast and Lung   . Cancer Brother     breast cancer    History  Substance Use Topics  . Smoking status: Former Smoker -- 0.50 packs/day for 59  years    Types: Cigarettes    Quit date: 02/04/2012  . Smokeless tobacco: Not on file  . Alcohol Use: No    OB History   Grav Para Term Preterm Abortions TAB SAB Ect Mult Living                  Review of Systems  Constitutional: Negative for fever and chills.  Respiratory: Positive for shortness of breath.   Cardiovascular: Negative for chest pain.  All other systems reviewed and are negative.    Allergies  Codeine; Ranitidine hcl; Sulfonamide derivatives; and Penicillins  Home Medications   Current Outpatient Rx  Name  Route  Sig  Dispense  Refill  . albuterol (PROVENTIL HFA;VENTOLIN HFA) 108 (90 BASE) MCG/ACT inhaler   Inhalation   Inhale 2 puffs into the lungs every 6 (six) hours as needed for wheezing.         Marland Kitchen aspirin EC 81 MG tablet   Oral   Take 81 mg by mouth  daily.         Marland Kitchen atorvastatin (LIPITOR) 40 MG tablet   Oral   Take 1 tablet (40 mg total) by mouth at bedtime.   30 tablet   3   . bisoprolol (ZEBETA) 10 MG tablet   Oral   Take 10 mg by mouth daily.         . budesonide-formoterol (SYMBICORT) 160-4.5 MCG/ACT inhaler   Inhalation   Inhale 2 puffs into the lungs 2 (two) times daily.         . clopidogrel (PLAVIX) 75 MG tablet   Oral   Take 1 tablet (75 mg total) by mouth daily with breakfast.   30 tablet   0   . diltiazem (CARDIZEM CD) 240 MG 24 hr capsule   Oral   Take 240 mg by mouth daily.         Marland Kitchen docusate sodium (COLACE) 100 MG capsule   Oral   Take 100 mg by mouth 2 (two) times daily.          . furosemide (LASIX) 40 MG tablet   Oral   Take 40 mg by mouth daily.         . hydroxypropyl methylcellulose (ISOPTO TEARS) 2.5 % ophthalmic solution   Both Eyes   Place 1 drop into both eyes daily as needed (for dry eyes).         . IPRATROPIUM BROMIDE NA   Nasal   Place 2 sprays into the nose daily as needed (for wheezing).         Marland Kitchen losartan (COZAAR) 50 MG tablet   Oral   Take 50 mg by mouth daily.         . Multiple Vitamins-Minerals (ICAPS PO)   Oral   Take 1 tablet by mouth daily.         . nitroGLYCERIN (NITROSTAT) 0.4 MG SL tablet   Sublingual   Place 0.4 mg under the tongue every 5 (five) minutes as needed for chest pain.         . pantoprazole (PROTONIX) 40 MG tablet   Oral   Take 40 mg by mouth daily.         . potassium chloride SA (K-DUR,KLOR-CON) 20 MEQ tablet   Oral   Take 20 mEq by mouth 2 (two) times daily.         . sennosides-docusate sodium (SENOKOT-S) 8.6-50 MG tablet   Oral   Take 1 tablet by  mouth daily.         Marland Kitchen tiotropium (SPIRIVA) 18 MCG inhalation capsule   Inhalation   Place 18 mcg into inhaler and inhale daily.          . Vitamin D, Ergocalciferol, (DRISDOL) 50000 UNITS CAPS   Oral   Take 50,000 Units by mouth every 14 (fourteen) days.            Triage Vitals: BP 155/55  Pulse 78  Temp(Src) 98 F (36.7 C) (Oral)  Resp 18  SpO2 100%  Physical Exam  Constitutional: She is oriented to person, place, and time. She appears well-developed.  HENT:  Head: Normocephalic.  Eyes: Conjunctivae are normal.  Neck: No tracheal deviation present.  Cardiovascular:  No murmur heard. Genitourinary:  1 cm diameter hematoma to right femoral, inguinal area. Intact femoral and pedal pulses.  Musculoskeletal: Normal range of motion.  Neurological: She is oriented to person, place, and time.  Skin: Skin is warm.  Psychiatric: She has a normal mood and affect.    ED Course  Procedures (including critical care time) DIAGNOSTIC STUDIES: Oxygen Saturation is 100% on room air, normal by my interpretation.    COORDINATION OF CARE: 11:08 PM- Patient informed of current plan for treatment and evaluation and agrees with plan at this time.      Labs Reviewed - No data to display No results found.   No diagnosis found.    MDM   1 cm hematoma to right groin from cath.  Good fem. Pulse and dp pusles.  Pt to follow up with cards this week.    The chart was scribed for me under my direct supervision.  I personally performed the history, physical, and medical decision making and all procedures in the evaluation of this patient.Benny Lennert, MD 07/25/12 [phone]

## 2012-07-25 NOTE — ED Notes (Addendum)
Patient reports that she had a cardiac cath last Monday (one week ago); patient reports that a "knot" has formed over the cardiac cath site (right femoral site).  Patient denies chest pain; reports shortness of breath, but reports that this is her baseline.  Patient always on 2 liters O2 at home.  Denies bleeding from site; patient has band-aid applied to site.  Patient taking Plavix.

## 2012-07-25 NOTE — Telephone Encounter (Signed)
*  TCM*; spoke with patient who states she just needs to get some more energy. Patient states she is compliant with her medication and denies chest discomfort.  Patient states she is always SOB; states this is not new for her. Patient speaks clearly without s/s of distress.  Patient has question about driving - states she was told in the hospital not to drive but cannot find this on her D/C instructions.  Patient advised to talk with Tereso Newcomer, PA-C at next appointment for further instruction on driving.  Patient states she only drives short distances now as it is and will not drive unless necessary.  Patient's appointment changed from 4/7 to 4/10 per patient request.

## 2012-07-26 ENCOUNTER — Telehealth: Payer: Self-pay | Admitting: Cardiology

## 2012-07-26 NOTE — Telephone Encounter (Signed)
New problem   Pt had cath 1 wk ago and have a knot at the site. Please call pt concerning this matter.

## 2012-07-26 NOTE — Telephone Encounter (Signed)
Follow up call    Was seen in the er last night . Was told to be seen this week. Both PA has no available open slot.

## 2012-07-26 NOTE — ED Notes (Addendum)
Pt seen by EDP and dispositioned prior to RN assessment, see MD notes, orders received and initiated. Pt alert, NAD, calm, interactive, resps e/u (baseline), speaking in clear long phrases. Family at [facility]. Self transfers to w/c.

## 2012-07-26 NOTE — Telephone Encounter (Signed)
Pt is concerned because the ER told her to f/u this week and she was told there are no openings this week.  Can this wait until next week?

## 2012-07-26 NOTE — Telephone Encounter (Signed)
Appt scheduled at 11:30am on Thursday with Dr Myrtis Ser.  Pt was notified.

## 2012-07-28 ENCOUNTER — Encounter: Payer: Self-pay | Admitting: Cardiology

## 2012-07-28 ENCOUNTER — Ambulatory Visit (INDEPENDENT_AMBULATORY_CARE_PROVIDER_SITE_OTHER): Payer: Medicare Other | Admitting: Cardiology

## 2012-07-28 VITALS — BP 130/58 | HR 55 | Ht 71.0 in | Wt 182.8 lb

## 2012-07-28 DIAGNOSIS — I4891 Unspecified atrial fibrillation: Secondary | ICD-10-CM

## 2012-07-28 DIAGNOSIS — T148XXA Other injury of unspecified body region, initial encounter: Secondary | ICD-10-CM

## 2012-07-28 DIAGNOSIS — K922 Gastrointestinal hemorrhage, unspecified: Secondary | ICD-10-CM

## 2012-07-28 DIAGNOSIS — R9430 Abnormal result of cardiovascular function study, unspecified: Secondary | ICD-10-CM

## 2012-07-28 DIAGNOSIS — I2510 Atherosclerotic heart disease of native coronary artery without angina pectoris: Secondary | ICD-10-CM | POA: Insufficient documentation

## 2012-07-28 DIAGNOSIS — I340 Nonrheumatic mitral (valve) insufficiency: Secondary | ICD-10-CM | POA: Insufficient documentation

## 2012-07-28 DIAGNOSIS — I10 Essential (primary) hypertension: Secondary | ICD-10-CM

## 2012-07-28 DIAGNOSIS — R0989 Other specified symptoms and signs involving the circulatory and respiratory systems: Secondary | ICD-10-CM

## 2012-07-28 DIAGNOSIS — I059 Rheumatic mitral valve disease, unspecified: Secondary | ICD-10-CM

## 2012-07-28 DIAGNOSIS — R609 Edema, unspecified: Secondary | ICD-10-CM

## 2012-07-28 DIAGNOSIS — J9610 Chronic respiratory failure, unspecified whether with hypoxia or hypercapnia: Secondary | ICD-10-CM

## 2012-07-28 NOTE — Assessment & Plan Note (Signed)
Despite the admission for unstable chest pain, her ejection fraction remains 55-60%.

## 2012-07-28 NOTE — Assessment & Plan Note (Signed)
In the cath lab it appeared the patient had significant mitral regurgitation. When she stabilizes after this 2-D echo revealed only mild mitral regurgitation. No further workup at this time.

## 2012-07-28 NOTE — Assessment & Plan Note (Signed)
The patient wears her chronic oxygen. She is relatively stable.

## 2012-07-28 NOTE — Patient Instructions (Addendum)
Your physician has recommended you make the following change in your medication: INCREASE your lasix to 40 mg twice daily today and tomorrow, then go back to once daily continue on your current medications as directed. Please refer to the Current Medication list given to you today.  Your physician recommends that you schedule a follow-up appointment in: 8 weeks with Dr Myrtis Ser  Decrease your fluid intake

## 2012-07-28 NOTE — Assessment & Plan Note (Signed)
Blood pressures control. No change in therapy. 

## 2012-07-28 NOTE — Assessment & Plan Note (Signed)
There is a history of GI bleeding from AVMs in the past. This is why a bare-metal stent was used. This is why she is not on anticoagulation for her paroxysmal atrial fibrillation. She'll be on Plavix for one month and then this will be stopped.

## 2012-07-28 NOTE — Assessment & Plan Note (Signed)
The patient does have some edema today. She also notices some abdominal bloating. I believe that she is mildly volume overloaded. I will give her 2 days of higher dose Lasix. In addition I have once again reminded her that she's taking too much fluid later in the day. She will cut this back a little bit.

## 2012-07-28 NOTE — Assessment & Plan Note (Signed)
The patient had some atrial fibrillation which he presented with unstable chest pain to the hospital recently. She then converted to sinus rhythm. She is not anticoagulated because of GI bleeding from AVMs.  As part of today's evaluation I spent greater than 25 minutes in her total care. I reviewed a great deal of records. In addition I spent greater than one half of the 25 minutes with direct contact with her.

## 2012-07-28 NOTE — Progress Notes (Signed)
HPI   Patient is seen today post hospitalization. She is seen for followup her recent bare-metal stent. She's also seen to followup small hematoma in her groin. She had been to the emergency room. As part of today's evaluation I have reviewed the hospital records completely concerning how her hospitalization. Also review the emergency room record concerning your visit for her hematoma.  She's not having any significant chest pain. She has less discomfort in her right groin and then when she went to the emergency room. She feels some abdominal bloating and has some mild peripheral edema.  Allergies  Allergen Reactions  . Codeine Other (See Comments)    unknown  . Ranitidine Hcl Other (See Comments)    unknown  . Sulfonamide Derivatives Nausea And Vomiting  . Penicillins Rash and Other (See Comments)    unknown    Current Outpatient Prescriptions  Medication Sig Dispense Refill  . albuterol (PROVENTIL HFA;VENTOLIN HFA) 108 (90 BASE) MCG/ACT inhaler Inhale 2 puffs into the lungs every 6 (six) hours as needed for wheezing.      Marland Kitchen aspirin EC 81 MG tablet Take 81 mg by mouth daily.      Marland Kitchen atorvastatin (LIPITOR) 40 MG tablet Take 1 tablet (40 mg total) by mouth at bedtime.  30 tablet  3  . bisoprolol (ZEBETA) 10 MG tablet Take 10 mg by mouth daily.      . budesonide-formoterol (SYMBICORT) 160-4.5 MCG/ACT inhaler Inhale 2 puffs into the lungs 2 (two) times daily.      . clopidogrel (PLAVIX) 75 MG tablet Take 1 tablet (75 mg total) by mouth daily with breakfast.  30 tablet  0  . diltiazem (CARDIZEM CD) 240 MG 24 hr capsule Take 240 mg by mouth daily.      Marland Kitchen docusate sodium (COLACE) 100 MG capsule Take 100 mg by mouth 2 (two) times daily.       . furosemide (LASIX) 40 MG tablet Take 40 mg by mouth daily.      . hydroxypropyl methylcellulose (ISOPTO TEARS) 2.5 % ophthalmic solution Place 1 drop into both eyes daily as needed (for dry eyes).      . IPRATROPIUM BROMIDE NA Place 2 sprays into the  nose daily as needed (for wheezing).      Marland Kitchen losartan (COZAAR) 50 MG tablet Take 50 mg by mouth daily.      . Multiple Vitamins-Minerals (ICAPS PO) Take 1 tablet by mouth daily.      . nitroGLYCERIN (NITROSTAT) 0.4 MG SL tablet Place 0.4 mg under the tongue every 5 (five) minutes as needed for chest pain.      . pantoprazole (PROTONIX) 40 MG tablet Take 40 mg by mouth daily.      . potassium chloride SA (K-DUR,KLOR-CON) 20 MEQ tablet Take 20 mEq by mouth 2 (two) times daily.      . sennosides-docusate sodium (SENOKOT-S) 8.6-50 MG tablet Take 1 tablet by mouth daily.      Marland Kitchen tiotropium (SPIRIVA) 18 MCG inhalation capsule Place 18 mcg into inhaler and inhale daily.       . Vitamin D, Ergocalciferol, (DRISDOL) 50000 UNITS CAPS Take 50,000 Units by mouth every 14 (fourteen) days.       No current facility-administered medications for this visit.    History   Social History  . Marital Status: Widowed    Spouse Name: N/A    Number of Children: 1  . Years of Education: N/A   Occupational History  . retired  [employer]   Social History Main Topics  . Smoking status: Former Smoker -- 0.50 packs/day for 59 years    Types: Cigarettes    Quit date: 02/04/2012  . Smokeless tobacco: Not on file  . Alcohol Use: No  . Drug Use: Not on file  . Sexually Active: Not on file   Other Topics Concern  . Not on file   Social History Narrative  . No narrative on file    Family History  Problem Relation Age of Onset  . Stroke Sister   . Heart failure Mother   . Cancer Sister     Breast and Lung   . Cancer Brother     breast cancer    Past Medical History  Diagnosis Date  . CAD (coronary artery disease)     a. s/p CABG x5 (2005) b. s/p BMS-prox SVG-D2 (2014)  . Hypertension   . Hyperlipidemia   . GI bleed     AVMs  . Aneurysm, aortic     thoracic aorta, stable at 4.1 cm, chest CT, May, 2012  . Syncope     Nitroglycerin plus a diuretic, April, 2009  . Hyponatremia     Chronic. Felt  secondary to SIADH   . COPD (chronic obstructive pulmonary disease)   . Tobacco abuse   . Bradycardia   . Carotid artery disease     Hx of endarterectomy. Doppler October, 2011, stable, 0-39% RIC A., 40-59% LICA  . Tuberculosis     Exposures to tuberculosis 1970s, has tested negative by the health Department  . Atrial fibrillation     Paroxysmal with RVR 07/2011, not felt to be a coumadin candidate secondary to hx of GIB/AVM  . Venous stasis of lower extremity     Chronic  . Ejection fraction     a. EF 55-60%, echo, April, 2013 b. EF 55-60%, mild biatrial enlargement and PASP 42 mmH  . CHF with left ventricular diastolic dysfunction, NYHA class 2   . AAA (abdominal aortic aneurysm)   . On home O2     2L N/C  . Complication of anesthesia   . Shortness of breath   . Hx of CABG     CABG 2005    Past Surgical History  Procedure Laterality Date  . Cholecystectomy    . Carotid endartercetomy    . Abdominal hysterectomy    . Coronary artery bypass graft  2005  . Coronary angioplasty with stent placement  07/18/12    severe native coronary artery disease, 99% proximal stenosis of the SVG-D2 status post successful PTCA/PCI with a Veriflex bare-metal stent, widely patent SVGs to both the right PDA sequential OM1 to OM 2, EF 65-70% and 3+ mitral regurgitation    Patient Active Problem List  Diagnosis  . OXYGEN-USE OF SUPPLEMENTAL  . Aortic valve sclerosis  . Hypertension  . Hyperlipidemia  . GI bleed  . Aneurysm, aortic  . Syncope  . Hyponatremia  . COPD (chronic obstructive pulmonary disease)  . Tobacco abuse  . Bradycardia  . Hx of CABG  . Ejection fraction  . Carotid artery disease  . Tuberculosis  . Edema  . Hypokalemia  . H/O steroid therapy  . Chronic respiratory failure  . Atrial fibrillation  . Generalized weakness  . Dehydration  . Alkalosis, metabolic  . Dyspnea  . Acute exacerbation of chronic obstructive pulmonary disease (COPD)  . Intermediate coronary  syndrome  . Chronic diastolic CHF (congestive heart failure)  . Hypotension  .  Anemia  . CAD (coronary artery disease)    ROS   Patient denies fever, chills, headache, sweats, rash, change in vision, change in hearing, chest pain, cough, nausea vomiting, urinary symptoms. All other systems are reviewed and are negative other than the history of present illness.  PHYSICAL EXAM  The patient is wearing her oxygen. She walks in appearing quite fatigued. She does not want to use a wheelchair for fear that she will be limited to a wheelchair. She is oriented to person time and place. Affect is normal. There is no jugulovenous distention. Lungs reveal a few scattered rales. There is no respiratory distress. Cardiac exam reveals distant heart sounds. The rhythm is regular today. There is a systolic murmur. The abdomen is soft. There is trace/ 1+ peripheral edema. There no musculoskeletal deformities. Patient has some areas of ecchymoses on her arms. There is a very small firm hematoma in her right groin. It is in the range of 1 cm. There is no bruit.  Filed Vitals:   07/28/12 1142  BP: 130/58  Pulse: 55  Height: 5\' 11"  (1.803 m)  Weight: 182 lb 12.8 oz (82.918 kg)  SpO2: 92%     ASSESSMENT & PLAN

## 2012-07-28 NOTE — Assessment & Plan Note (Signed)
The patient has a small residual hematoma after the catheterization the right groin. She had pain from and he went to the emergency room. It was felt to be stable. Today she is not having any discomfort. This should disappear over time. There is no bruit. No further workup.

## 2012-08-01 ENCOUNTER — Encounter: Payer: Medicare Other | Admitting: Physician Assistant

## 2012-08-04 ENCOUNTER — Encounter: Payer: Medicare Other | Admitting: Physician Assistant

## 2012-08-09 ENCOUNTER — Other Ambulatory Visit: Payer: Self-pay | Admitting: *Deleted

## 2012-08-09 MED ORDER — ATORVASTATIN CALCIUM 40 MG PO TABS: 40.0000 mg | ORAL_TABLET | Freq: Every day | ORAL | Status: DC

## 2012-08-20 ENCOUNTER — Other Ambulatory Visit: Payer: Self-pay | Admitting: Pulmonary Disease

## 2012-09-08 ENCOUNTER — Other Ambulatory Visit: Payer: Self-pay

## 2012-09-08 MED ORDER — PANTOPRAZOLE SODIUM 40 MG PO TBEC: 40.0000 mg | DELAYED_RELEASE_TABLET | Freq: Every day | ORAL | Status: DC

## 2012-09-23 ENCOUNTER — Other Ambulatory Visit (HOSPITAL_COMMUNITY): Payer: Self-pay | Admitting: Family Medicine

## 2012-09-23 ENCOUNTER — Ambulatory Visit (HOSPITAL_COMMUNITY)
Admission: RE | Admit: 2012-09-23 | Discharge: 2012-09-23 | Disposition: A | Discharge status: Home / Self Care | Payer: Medicare Other | Source: Ambulatory Visit | Attending: Family Medicine | Admitting: Family Medicine

## 2012-09-23 DIAGNOSIS — M25562 Pain in left knee: Secondary | ICD-10-CM

## 2012-09-23 DIAGNOSIS — M7989 Other specified soft tissue disorders: Secondary | ICD-10-CM | POA: Insufficient documentation

## 2012-09-23 DIAGNOSIS — S81009A Unspecified open wound, unspecified knee, initial encounter: Secondary | ICD-10-CM | POA: Insufficient documentation

## 2012-09-23 DIAGNOSIS — W1809XA Striking against other object with subsequent fall, initial encounter: Secondary | ICD-10-CM | POA: Insufficient documentation

## 2012-09-23 DIAGNOSIS — M79609 Pain in unspecified limb: Secondary | ICD-10-CM

## 2012-09-23 NOTE — Progress Notes (Signed)
*  PRELIMINARY RESULTS* Vascular Ultrasound Left lower extremity venous duplex has been completed.  Preliminary findings: Left = no evidence of DVT. Difficult to clearly evaluate calf veins due to edema.  Incidental finding = monophasic flow noted in left PT and DP arteries.     Farrel Demark, RDMS, RVT  09/23/2012, 5:11 PM

## 2012-09-26 ENCOUNTER — Ambulatory Visit (INDEPENDENT_AMBULATORY_CARE_PROVIDER_SITE_OTHER): Payer: Medicare Other | Admitting: Cardiology

## 2012-09-26 ENCOUNTER — Encounter: Payer: Self-pay | Admitting: Cardiology

## 2012-09-26 VITALS — BP 134/50 | HR 63 | Ht 71.0 in | Wt 181.8 lb

## 2012-09-26 DIAGNOSIS — I2510 Atherosclerotic heart disease of native coronary artery without angina pectoris: Secondary | ICD-10-CM

## 2012-09-26 DIAGNOSIS — L97929 Non-pressure chronic ulcer of unspecified part of left lower leg with unspecified severity: Secondary | ICD-10-CM

## 2012-09-26 DIAGNOSIS — I5032 Chronic diastolic (congestive) heart failure: Secondary | ICD-10-CM

## 2012-09-26 DIAGNOSIS — I509 Heart failure, unspecified: Secondary | ICD-10-CM

## 2012-09-26 DIAGNOSIS — L97909 Non-pressure chronic ulcer of unspecified part of unspecified lower leg with unspecified severity: Secondary | ICD-10-CM | POA: Insufficient documentation

## 2012-09-26 NOTE — Assessment & Plan Note (Signed)
Her overall volume status is stable. No change in therapy. 

## 2012-09-26 NOTE — Progress Notes (Signed)
HPI  Patient is seen today to followup coronary disease. I saw her last table the third, 2014. She recently received a bare-metal stent. She was treated with aspirin and Plavix. The Plavix has now been stopped.  Unfortunately she has a wound on the anterior aspect of her left shin. This is being treated by her primary physician. There is redness in addition to the actual wound. Although this is better than it has been recently.  Allergies  Allergen Reactions  . Codeine Other (See Comments)    unknown  . Ranitidine Hcl Other (See Comments)    unknown  . Sulfonamide Derivatives Nausea And Vomiting  . Penicillins Rash and Other (See Comments)    unknown    Current Outpatient Prescriptions  Medication Sig Dispense Refill  . albuterol (PROVENTIL HFA;VENTOLIN HFA) 108 (90 BASE) MCG/ACT inhaler Inhale 2 puffs into the lungs every 6 (six) hours as needed for wheezing.      Marland Kitchen aspirin EC 81 MG tablet Take 81 mg by mouth daily.      Marland Kitchen atorvastatin (LIPITOR) 40 MG tablet Take 1 tablet (40 mg total) by mouth at bedtime.  90 tablet  0  . bisoprolol (ZEBETA) 10 MG tablet Take 10 mg by mouth daily.      . budesonide-formoterol (SYMBICORT) 160-4.5 MCG/ACT inhaler Inhale 2 puffs into the lungs 2 (two) times daily.      . clopidogrel (PLAVIX) 75 MG tablet Take 1 tablet (75 mg total) by mouth daily with breakfast.  30 tablet  0  . diltiazem (CARDIZEM CD) 240 MG 24 hr capsule Take 240 mg by mouth daily.      Marland Kitchen DOXYCYCLINE CALCIUM PO Take by mouth.      . furosemide (LASIX) 40 MG tablet Take 40 mg by mouth daily.      . hydroxypropyl methylcellulose (ISOPTO TEARS) 2.5 % ophthalmic solution Place 1 drop into both eyes daily as needed (for dry eyes).      . IPRATROPIUM BROMIDE NA Place 2 sprays into the nose daily as needed (for wheezing).      Marland Kitchen losartan (COZAAR) 50 MG tablet Take 50 mg by mouth daily.      . Multiple Vitamins-Minerals (ICAPS PO) Take 1 tablet by mouth daily.      . nitroGLYCERIN  (NITROSTAT) 0.4 MG SL tablet Place 0.4 mg under the tongue every 5 (five) minutes as needed for chest pain.      . pantoprazole (PROTONIX) 40 MG tablet Take 1 tablet (40 mg total) by mouth daily.  90 tablet  0  . Polyethylene Glycol 3350 (MIRALAX PO) Take by mouth.      . potassium chloride SA (K-DUR,KLOR-CON) 20 MEQ tablet Take 20 mEq by mouth 2 (two) times daily.      Marland Kitchen PROAIR HFA 108 (90 BASE) MCG/ACT inhaler INHALE 2 PUFFS INTO THE LUNGS EVERY 6 HOURS AS NEEDED  3 each  0  . sennosides-docusate sodium (SENOKOT-S) 8.6-50 MG tablet Take 1 tablet by mouth daily.      Marland Kitchen tiotropium (SPIRIVA) 18 MCG inhalation capsule Place 18 mcg into inhaler and inhale daily.       . Vitamin D, Ergocalciferol, (DRISDOL) 50000 UNITS CAPS Take 50,000 Units by mouth every 14 (fourteen) days.       No current facility-administered medications for this visit.    History   Social History  . Marital Status: Widowed    Spouse Name: N/A    Number of Children: 1  . Years  of Education: N/A   Occupational History  . retired     [employer]   Social History Main Topics  . Smoking status: Former Smoker -- 0.50 packs/day for 59 years    Types: Cigarettes    Quit date: 02/04/2012  . Smokeless tobacco: Not on file  . Alcohol Use: No  . Drug Use: Not on file  . Sexually Active: Not on file   Other Topics Concern  . Not on file   Social History Narrative  . No narrative on file    Family History  Problem Relation Age of Onset  . Stroke Sister   . Heart failure Mother   . Cancer Sister     Breast and Lung   . Cancer Brother     breast cancer    Past Medical History  Diagnosis Date  . CAD (coronary artery disease)     a. s/p CABG x5 (2005) b. s/p BMS-prox SVG-D2 (2014)  . Hypertension   . Hyperlipidemia   . GI bleed     AVMs  . Aneurysm, aortic     thoracic aorta, stable at 4.1 cm, chest CT, May, 2012  . Syncope     Nitroglycerin plus a diuretic, April, 2009  . Hyponatremia     Chronic. Felt  secondary to SIADH   . COPD (chronic obstructive pulmonary disease)   . Tobacco abuse   . Bradycardia   . Carotid artery disease     Hx of endarterectomy. Doppler October, 2011, stable, 0-39% RIC A., 40-59% LICA  . Tuberculosis     Exposures to tuberculosis 1970s, has tested negative by the health Department  . Atrial fibrillation     Paroxysmal with RVR 07/2011, not felt to be a coumadin candidate secondary to hx of GIB/AVM  . Venous stasis of lower extremity     Chronic  . Ejection fraction     a. EF 55-60%, echo, April, 2013 b. EF 55-60%, mild biatrial enlargement and PASP 42 mmH  . CHF with left ventricular diastolic dysfunction, NYHA class 2   . AAA (abdominal aortic aneurysm)   . On home O2     2L N/C  . Complication of anesthesia   . Shortness of breath   . Hx of CABG     CABG 2005  . Hematoma     Right groin hematoma, small, post cath, April, 2014  . Mitral regurgitation     Mild, hospital, March, 2014    Past Surgical History  Procedure Laterality Date  . Cholecystectomy    . Carotid endartercetomy    . Abdominal hysterectomy    . Coronary artery bypass graft  2005  . Coronary angioplasty with stent placement  07/18/12    severe native coronary artery disease, 99% proximal stenosis of the SVG-D2 status post successful PTCA/PCI with a Veriflex bare-metal stent, widely patent SVGs to both the right PDA sequential OM1 to OM 2, EF 65-70% and 3+ mitral regurgitation    Patient Active Problem List   Diagnosis Date Noted  . CAD (coronary artery disease)   . Hematoma   . Mitral regurgitation   . Chronic diastolic CHF (congestive heart failure) 07/20/2012  . Hypotension 07/20/2012  . Anemia 07/20/2012  . Intermediate coronary syndrome 07/18/2012  . Acute exacerbation of chronic obstructive pulmonary disease (COPD) 02/15/2012  . Dyspnea 02/13/2012  . Dehydration 02/12/2012  . Alkalosis, metabolic 02/12/2012  . Generalized weakness 02/11/2012  . Atrial fibrillation     . Chronic respiratory failure  07/29/2011  . Hypokalemia 07/28/2011  . H/O steroid therapy 07/28/2011  . Edema   . Aortic valve sclerosis   . Hypertension   . Hyperlipidemia   . GI bleed   . Aneurysm, aortic   . Syncope   . Hyponatremia   . COPD (chronic obstructive pulmonary disease)   . Tobacco abuse   . Bradycardia   . Hx of CABG   . Ejection fraction   . Carotid artery disease   . Tuberculosis   . OXYGEN-USE OF SUPPLEMENTAL 08/02/2009    ROS   Patient denies fever, chills, headache, sweats, rash, change in vision, change in hearing, chest pain, cough, nausea vomiting, urinary symptoms. All other systems are reviewed and are negative.  PHYSICAL EXAM  Patient is in a wheelchair. She is oriented to person time and place. Affect is normal. Lungs reveal decreased breath sounds. Cardiac exam reveals S1 and S2. There is no respiratory distress. The abdomen is soft. She has a significant wound on her left lower leg. It is healing but I have not seen it over time. She's getting ongoing care from her primary physician. There is no significant peripheral edema.  Filed Vitals:   09/26/12 1423  BP: 134/50  Pulse: 63  Height: 5\' 11"  (1.803 m)  Weight: 181 lb 12.8 oz (82.464 kg)  SpO2: 96%    EKG  ASSESSMENT & PLAN

## 2012-09-26 NOTE — Assessment & Plan Note (Signed)
Coronary disease is stable. No change in therapy. 

## 2012-09-26 NOTE — Patient Instructions (Addendum)
Continue same medication   Your physician recommends that you schedule a follow-up appointment in: 3 months

## 2012-09-26 NOTE — Assessment & Plan Note (Signed)
The ulcerated skin lesion on her leg is healing. She is being followed by her primary physician.

## 2012-09-28 ENCOUNTER — Emergency Department (HOSPITAL_COMMUNITY): Payer: Medicare Other

## 2012-09-28 ENCOUNTER — Inpatient Hospital Stay (HOSPITAL_COMMUNITY)
Admission: EM | Admit: 2012-09-28 | Discharge: 2012-10-01 | DRG: 603 | Disposition: A | Discharge status: Home / Self Care | Payer: Medicare Other | Attending: Internal Medicine | Admitting: Internal Medicine

## 2012-09-28 DIAGNOSIS — I10 Essential (primary) hypertension: Secondary | ICD-10-CM | POA: Diagnosis present

## 2012-09-28 DIAGNOSIS — Z87891 Personal history of nicotine dependence: Secondary | ICD-10-CM

## 2012-09-28 DIAGNOSIS — Z951 Presence of aortocoronary bypass graft: Secondary | ICD-10-CM

## 2012-09-28 DIAGNOSIS — I509 Heart failure, unspecified: Secondary | ICD-10-CM | POA: Diagnosis present

## 2012-09-28 DIAGNOSIS — L02419 Cutaneous abscess of limb, unspecified: Principal | ICD-10-CM | POA: Diagnosis present

## 2012-09-28 DIAGNOSIS — I5032 Chronic diastolic (congestive) heart failure: Secondary | ICD-10-CM

## 2012-09-28 DIAGNOSIS — E785 Hyperlipidemia, unspecified: Secondary | ICD-10-CM | POA: Diagnosis present

## 2012-09-28 DIAGNOSIS — S81802A Unspecified open wound, left lower leg, initial encounter: Secondary | ICD-10-CM

## 2012-09-28 DIAGNOSIS — Z7982 Long term (current) use of aspirin: Secondary | ICD-10-CM

## 2012-09-28 DIAGNOSIS — L03119 Cellulitis of unspecified part of limb: Secondary | ICD-10-CM

## 2012-09-28 DIAGNOSIS — Z9861 Coronary angioplasty status: Secondary | ICD-10-CM

## 2012-09-28 DIAGNOSIS — K219 Gastro-esophageal reflux disease without esophagitis: Secondary | ICD-10-CM | POA: Diagnosis present

## 2012-09-28 DIAGNOSIS — J9610 Chronic respiratory failure, unspecified whether with hypoxia or hypercapnia: Secondary | ICD-10-CM

## 2012-09-28 DIAGNOSIS — F17200 Nicotine dependence, unspecified, uncomplicated: Secondary | ICD-10-CM | POA: Diagnosis present

## 2012-09-28 DIAGNOSIS — Z79899 Other long term (current) drug therapy: Secondary | ICD-10-CM

## 2012-09-28 DIAGNOSIS — Z720 Tobacco use: Secondary | ICD-10-CM

## 2012-09-28 DIAGNOSIS — I2510 Atherosclerotic heart disease of native coronary artery without angina pectoris: Secondary | ICD-10-CM | POA: Diagnosis present

## 2012-09-28 DIAGNOSIS — I059 Rheumatic mitral valve disease, unspecified: Secondary | ICD-10-CM | POA: Diagnosis present

## 2012-09-28 DIAGNOSIS — J4489 Other specified chronic obstructive pulmonary disease: Secondary | ICD-10-CM | POA: Diagnosis present

## 2012-09-28 DIAGNOSIS — R609 Edema, unspecified: Secondary | ICD-10-CM

## 2012-09-28 DIAGNOSIS — I4891 Unspecified atrial fibrillation: Secondary | ICD-10-CM | POA: Diagnosis present

## 2012-09-28 DIAGNOSIS — I739 Peripheral vascular disease, unspecified: Secondary | ICD-10-CM | POA: Diagnosis present

## 2012-09-28 DIAGNOSIS — E871 Hypo-osmolality and hyponatremia: Secondary | ICD-10-CM | POA: Diagnosis not present

## 2012-09-28 DIAGNOSIS — L0390 Cellulitis, unspecified: Secondary | ICD-10-CM

## 2012-09-28 DIAGNOSIS — J449 Chronic obstructive pulmonary disease, unspecified: Secondary | ICD-10-CM

## 2012-09-28 LAB — CBC WITH DIFFERENTIAL/PLATELET
Basophils Absolute: 0.1 10*3/uL (ref 0.0–0.1)
Lymphocytes Relative: 16 % (ref 12–46)
Lymphs Abs: 1.1 10*3/uL (ref 0.7–4.0)
Neutro Abs: 4.7 10*3/uL (ref 1.7–7.7)
Neutrophils Relative %: 67 % (ref 43–77)
Platelets: 208 10*3/uL (ref 150–400)
RBC: 3.74 MIL/uL — ABNORMAL LOW (ref 3.87–5.11)
RDW: 13.7 % (ref 11.5–15.5)
WBC: 7 10*3/uL (ref 4.0–10.5)

## 2012-09-28 LAB — BASIC METABOLIC PANEL
CO2: 34 mEq/L — ABNORMAL HIGH (ref 19–32)
Calcium: 10.1 mg/dL (ref 8.4–10.5)
Chloride: 99 mEq/L (ref 96–112)
Potassium: 4 mEq/L (ref 3.5–5.1)
Sodium: 138 mEq/L (ref 135–145)

## 2012-09-28 MED ORDER — CLINDAMYCIN HCL 300 MG PO CAPS
300.0000 mg | ORAL_CAPSULE | Freq: Once | ORAL | Status: AC
  Administered 2012-09-28: 300 mg via ORAL
  Filled 2012-09-28: qty 1

## 2012-09-28 NOTE — H&P (Addendum)
[facility] History and Physical  MAEGAN BULLER WJX:[id number] DOB: December 02, 1929 DOA: 09/28/2012   PCP: Kaleen Mask, MD   Chief Complaint: Leg wound  HPI:  79 year old female with a history of hypertension, hyperlipidemia, coronary artery disease status post bare metal stent (09/17/2012), and GI bleed presents with increasing pain and bleeding around her left leg wound. According to the patient, the leg wound started approximately 5-6 weeks ago when the patient hit the corner of a brick developed an ulceration.. The patient went to see her primary care physician approximately 3 weeks ago. Apparently, her primary care provider performed an office I&D at that time. The patient was placed on doxycycline x10 days. The redness improved. However the patient noted increasing redness and pain approximately one week ago. The patient was started on a second course of doxycycline on Friday, 09/23/2012. The patient complained of on a daily worsening of erythema from her left lower extremity wound. Unfortunately, today, the patient noted some bleeding around the left leg wound, and has noted increasing warmth and redness to the wound. The patient denied any new trauma to her leg. As a result, the patient came to the emergency department for further evaluation.   The patient has been complaining of dyspnea on exertion for the past 2 days. She denies any chest discomfort, dizziness, syncope. There is no orthopnea, PND, increasing abdominal girth. The patient was given a dose of oral clindamycin. Lower in the emergency department revealed WBC 7.0 and unremarkable BMP. The patient was afebrile and hemodynamically stable.  Assessment/Plan: Cellulitis of the left lower extremity- -discontinue doxycycline -Start patient on intravenous vancomycin -I believe the patient's slow wound healing may be partly contributed to the patient's peripheral vascular disease. -Patient was noted to have monophasic flow of  the left posterior tibial and left dorsalis pedis on venous Doppler of the left lower extremity on 09/23/2012 -Consult wound care nurse -Obtain ABI if possible Hypertension  -Continue bisoprolol, Cardizem CD, losartan Hyperlipidemia -Continue atorvastatin Coronary artery disease  -Continue aspirin COPD -Compensated at this time -Continue Spiriva -Albuterol nebulizer when necessary shortness breath -normally on home O2--2liters Dyspnea on exertion -Cycle troponins -EKG -Chest x-ray Tobacco abuse -Tobacco cessation discussed      Past Medical History  Diagnosis Date  . CAD (coronary artery disease)     a. s/p CABG x5 (2005) b. s/p BMS-prox SVG-D2 (2014)  . Hypertension   . Hyperlipidemia   . GI bleed     AVMs  . Aneurysm, aortic     thoracic aorta, stable at 4.1 cm, chest CT, May, 2012  . Syncope     Nitroglycerin plus a diuretic, April, 2009  . Hyponatremia     Chronic. Felt secondary to SIADH   . COPD (chronic obstructive pulmonary disease)   . Tobacco abuse   . Bradycardia   . Carotid artery disease     Hx of endarterectomy. Doppler October, 2011, stable, 0-39% RIC A., 40-59% LICA  . Tuberculosis     Exposures to tuberculosis 1970s, has tested negative by the health Department  . Atrial fibrillation     Paroxysmal with RVR 07/2011, not felt to be a coumadin candidate secondary to hx of GIB/AVM  . Venous stasis of lower extremity     Chronic  . Ejection fraction     a. EF 55-60%, echo, April, 2013 b. EF 55-60%, mild biatrial enlargement and PASP 42 mmH  . CHF with left ventricular diastolic dysfunction, NYHA class 2   . AAA (abdominal  aortic aneurysm)   . On home O2     2L N/C  . Complication of anesthesia   . Shortness of breath   . Hx of CABG     CABG 2005  . Hematoma     Right groin hematoma, small, post cath, April, 2014  . Mitral regurgitation     Mild, hospital, March, 2014  . Leg ulcer     Ulcerated lesion on the anterior aspect of her left lower  leg, June, 2014   Past Surgical History  Procedure Laterality Date  . Cholecystectomy    . Carotid endartercetomy    . Abdominal hysterectomy    . Coronary artery bypass graft  2005  . Coronary angioplasty with stent placement  07/18/12    severe native coronary artery disease, 99% proximal stenosis of the SVG-D2 status post successful PTCA/PCI with a Veriflex bare-metal stent, widely patent SVGs to both the right PDA sequential OM1 to OM 2, EF 65-70% and 3+ mitral regurgitation   Social History:  reports that she quit smoking about 7 months ago. Her smoking use included Cigarettes. She has a 29.5 pack-year smoking history. She does not have any smokeless tobacco history on file. She reports that she does not drink alcohol. Her drug history is not on file.   Family History  Problem Relation Age of Onset  . Stroke Sister   . Heart failure Mother   . Cancer Sister     Breast and Lung   . Cancer Brother     breast cancer     Allergies  Allergen Reactions  . Codeine Other (See Comments)    unknown  . Ranitidine Hcl Other (See Comments)    unknown  . Sulfonamide Derivatives Nausea And Vomiting  . Penicillins Rash and Other (See Comments)    unknown      Prior to Admission medications   Medication Sig Start Date End Date Taking? Authorizing Provider  albuterol (PROVENTIL HFA;VENTOLIN HFA) 108 (90 BASE) MCG/ACT inhaler Inhale 2 puffs into the lungs every 6 (six) hours as needed for wheezing.   Yes Historical Provider, MD  aspirin EC 81 MG tablet Take 81 mg by mouth daily.   Yes Historical Provider, MD  atorvastatin (LIPITOR) 40 MG tablet Take 1 tablet (40 mg total) by mouth at bedtime. 08/09/12  Yes Luis Abed, MD  bisoprolol (ZEBETA) 10 MG tablet Take 10 mg by mouth daily.   Yes Historical Provider, MD  budesonide-formoterol (SYMBICORT) 160-4.5 MCG/ACT inhaler Inhale 2 puffs into the lungs 2 (two) times daily.   Yes Historical Provider, MD  diltiazem (CARDIZEM CD) 240 MG 24  hr capsule Take 240 mg by mouth daily.   Yes Historical Provider, MD  docusate sodium (COLACE) 100 MG capsule Take 100 mg by mouth 2 (two) times daily.   Yes Historical Provider, MD  furosemide (LASIX) 40 MG tablet Take 40 mg by mouth daily.   Yes Historical Provider, MD  hydroxypropyl methylcellulose (ISOPTO TEARS) 2.5 % ophthalmic solution Place 1 drop into both eyes daily as needed (for dry eyes).   Yes Historical Provider, MD  IPRATROPIUM BROMIDE NA Place 2 sprays into the nose daily as needed (for wheezing).   Yes Historical Provider, MD  losartan (COZAAR) 50 MG tablet Take 50 mg by mouth daily.   Yes Historical Provider, MD  Multiple Vitamins-Minerals (ICAPS PO) Take 1 tablet by mouth daily.   Yes Historical Provider, MD  nitroGLYCERIN (NITROSTAT) 0.4 MG SL tablet Place 0.4 mg under  the tongue every 5 (five) minutes as needed for chest pain.   Yes Historical Provider, MD  pantoprazole (PROTONIX) 40 MG tablet Take 1 tablet (40 mg total) by mouth daily. 09/08/12  Yes Peter M [place], MD  Polyethylene Glycol 3350 (MIRALAX PO) Take by mouth.   Yes Historical Provider, MD  potassium chloride SA (K-DUR,KLOR-CON) 20 MEQ tablet Take 20 mEq by mouth 2 (two) times daily.   Yes Historical Provider, MD  PROAIR HFA 108 (90 BASE) MCG/ACT inhaler INHALE 2 PUFFS INTO THE LUNGS EVERY 6 HOURS AS NEEDED 08/20/12  Yes Oretha Milch, MD  sennosides-docusate sodium (SENOKOT-S) 8.6-50 MG tablet Take 1 tablet by mouth daily.   Yes Luis Abed, MD  tiotropium (SPIRIVA) 18 MCG inhalation capsule Place 18 mcg into inhaler and inhale daily.    Yes Historical Provider, MD  Vitamin D, Ergocalciferol, (DRISDOL) 50000 UNITS CAPS Take 50,000 Units by mouth every 7 (seven) days. sundays   Yes Historical Provider, MD    Review of Systems:  Constitutional:  No weight loss, night sweats, Fevers, chills, fatigue.  Head&Eyes: No headache.  No vision loss.  No eye pain or scotoma ENT:  No Difficulty swallowing,Tooth/dental  problems,Sore throat,   Cardio-vascular:  No chest pain, Orthopnea, PND,  dizziness, palpitations  GI:  No  abdominal pain, nausea, vomiting, diarrhea, loss of appetite, hematochezia, melena, heartburn, indigestion, Resp:  No shortness of breath with exertion or at rest. No cough. No coughing up of blood .No wheezing.No chest wall deformity  Skin:  no rash or lesions.  GU:  no dysuria, change in color of urine, no urgency or frequency. No flank pain.  Musculoskeletal:  No joint pain or swelling. No decreased range of motion. No back pain.  Psych:  No change in mood or affect. No depression or anxiety. Neurologic: No headache, no dysesthesia, no focal weakness, no vision loss. No syncope  Physical Exam: Filed Vitals:   09/28/12 1834 09/28/12 1835 09/28/12 2000  BP: 121/56 138/78 140/52  Pulse: 56 68 54  Temp: 97.9 F (36.6 C)    TempSrc: Oral    Resp: 20 20   SpO2: 100%  100%   General:  A&O x 3, NAD, nontoxic, pleasant/cooperative Head/Eye: No conjunctival hemorrhage, no icterus, [place]/AT, No nystagmus ENT:  No icterus,  No thrush, good dentition, no pharyngeal exudate Neck:  No masses, no lymphadenpathy, no bruits CV:  RRR, no rub, no gallop, no S3 Lung:  CTAB, good air movement, no wheeze, no rhonchi Abdomen: soft/NT, +BS, nondistended, no peritoneal signs Ext: Left lower extremity pretibial area with approximately 1.5 cm wound without any necrosis. There is no pus. There is erythematous extends from the infrapatellar area to the dorsum of her midfoot on the left. No crepitance.   Labs on Admission:  Basic Metabolic Panel:  Recent Labs Lab 09/28/12 1905  NA 138  K 4.0  CL 99  CO2 34*  GLUCOSE 90  BUN 21  CREATININE 1.08  CALCIUM 10.1   Liver Function Tests: No results found for this basename: AST, ALT, ALKPHOS, BILITOT, PROT, ALBUMIN,  in the last 168 hours No results found for this basename: LIPASE, AMYLASE,  in the last 168 hours No results found for this  basename: AMMONIA,  in the last 168 hours CBC:  Recent Labs Lab 09/28/12 1905  WBC 7.0  NEUTROABS 4.7  HGB 11.0*  HCT 34.1*  MCV 91.2  PLT 208   Cardiac Enzymes: No results found for this basename: CKTOTAL,  CKMB, CKMBINDEX, TROPONINI,  in the last 168 hours BNP: No components found with this basename: POCBNP,  CBG: No results found for this basename: GLUCAP,  in the last 168 hours  Radiological Exams on Admission: No results found.      Time spent:60 minutes Code Status:   FULL Family Communication:   granddaughter at bedside   Irisha Grandmaison, DO  [facility] Pager [phone]  If 7PM-7AM, please contact night-coverage [email or web address] Password [email or web address] 09/28/2012, 9:10 PM

## 2012-09-28 NOTE — ED Provider Notes (Signed)
History     CSN: [id number]  Arrival date & time 09/28/12  4098   First MD Initiated Contact with Patient 09/28/12 1833      No chief complaint on file.   (Consider location/radiation/quality/duration/timing/severity/associated sxs/prior treatment) HPI Comments: 79 y.o. female who presents to the Er w/ the cc of left lower leg wound and bleeding. Pt states that 5 to 6 weeks ago she hit her left lower leg (anterior aspect), on a brick. And that after this, her wound has not healed properly. She has been on doxycycline twice for redness associated in the area, and she states her primary doctor has "drained the wound" as well. She was on her second course of doxycycline over the past week -- states the redness is not improving, and today, she had "a lot of blood" come from the wound, prompting current ER visit.   Patient is a 79 y.o. female presenting with general illness. The history is provided by the patient.  Illness Location:  Left lower leg Severity:  Mild Onset quality:  Gradual Duration:  5 weeks Timing:  Constant Progression:  Worsening Chronicity:  New Associated symptoms: no abdominal pain, no chest pain, no cough, no diarrhea, no fatigue, no fever, no headaches, no rash, no vomiting and no wheezing     Past Medical History  Diagnosis Date  . CAD (coronary artery disease)     a. s/p CABG x5 (2005) b. s/p BMS-prox SVG-D2 (2014)  . Hypertension   . Hyperlipidemia   . GI bleed     AVMs  . Aneurysm, aortic     thoracic aorta, stable at 4.1 cm, chest CT, May, 2012  . Syncope     Nitroglycerin plus a diuretic, April, 2009  . Hyponatremia     Chronic. Felt secondary to SIADH   . COPD (chronic obstructive pulmonary disease)   . Tobacco abuse   . Bradycardia   . Carotid artery disease     Hx of endarterectomy. Doppler October, 2011, stable, 0-39% RIC A., 40-59% LICA  . Tuberculosis     Exposures to tuberculosis 1970s, has tested negative by the health Department  .  Atrial fibrillation     Paroxysmal with RVR 07/2011, not felt to be a coumadin candidate secondary to hx of GIB/AVM  . Venous stasis of lower extremity     Chronic  . Ejection fraction     a. EF 55-60%, echo, April, 2013 b. EF 55-60%, mild biatrial enlargement and PASP 42 mmH  . CHF with left ventricular diastolic dysfunction, NYHA class 2   . AAA (abdominal aortic aneurysm)   . On home O2     2L N/C  . Complication of anesthesia   . Shortness of breath   . Hx of CABG     CABG 2005  . Hematoma     Right groin hematoma, small, post cath, April, 2014  . Mitral regurgitation     Mild, hospital, March, 2014  . Leg ulcer     Ulcerated lesion on the anterior aspect of her left lower leg, June, 2014    Past Surgical History  Procedure Laterality Date  . Cholecystectomy    . Carotid endartercetomy    . Abdominal hysterectomy    . Coronary artery bypass graft  2005  . Coronary angioplasty with stent placement  07/18/12    severe native coronary artery disease, 99% proximal stenosis of the SVG-D2 status post successful PTCA/PCI with a Veriflex bare-metal stent, widely patent SVGs to  both the right PDA sequential OM1 to OM 2, EF 65-70% and 3+ mitral regurgitation    Family History  Problem Relation Age of Onset  . Stroke Sister   . Heart failure Mother   . Cancer Sister     Breast and Lung   . Cancer Brother     breast cancer    History  Substance Use Topics  . Smoking status: Former Smoker -- 0.50 packs/day for 59 years    Types: Cigarettes    Quit date: 02/04/2012  . Smokeless tobacco: Not on file  . Alcohol Use: No    OB History   Grav Para Term Preterm Abortions TAB SAB Ect Mult Living                  Review of Systems  Constitutional: Negative for fever, chills and fatigue.  HENT: Negative for facial swelling, drooling, neck pain and dental problem.   Eyes: Negative for pain, discharge and itching.  Respiratory: Negative for cough, choking, wheezing and stridor.    Cardiovascular: Negative for chest pain.  Gastrointestinal: Negative for vomiting, abdominal pain and diarrhea.  Endocrine: Negative for cold intolerance and heat intolerance.  Genitourinary: Negative for vaginal discharge, difficulty urinating and vaginal pain.  Skin: Positive for wound. Negative for pallor and rash.  Neurological: Negative for dizziness, light-headedness and headaches.  Psychiatric/Behavioral: Negative for behavioral problems and agitation.    Allergies  Codeine; Ranitidine hcl; Sulfonamide derivatives; and Penicillins  Home Medications   Current Outpatient Rx  Name  Route  Sig  Dispense  Refill  . albuterol (PROVENTIL HFA;VENTOLIN HFA) 108 (90 BASE) MCG/ACT inhaler   Inhalation   Inhale 2 puffs into the lungs every 6 (six) hours as needed for wheezing.         Marland Kitchen aspirin EC 81 MG tablet   Oral   Take 81 mg by mouth daily.         Marland Kitchen atorvastatin (LIPITOR) 40 MG tablet   Oral   Take 1 tablet (40 mg total) by mouth at bedtime.   90 tablet   0   . bisoprolol (ZEBETA) 10 MG tablet   Oral   Take 10 mg by mouth daily.         . budesonide-formoterol (SYMBICORT) 160-4.5 MCG/ACT inhaler   Inhalation   Inhale 2 puffs into the lungs 2 (two) times daily.         . clopidogrel (PLAVIX) 75 MG tablet   Oral   Take 1 tablet (75 mg total) by mouth daily with breakfast.   30 tablet   0   . diltiazem (CARDIZEM CD) 240 MG 24 hr capsule   Oral   Take 240 mg by mouth daily.         Marland Kitchen DOXYCYCLINE CALCIUM PO   Oral   Take by mouth.         . furosemide (LASIX) 40 MG tablet   Oral   Take 40 mg by mouth daily.         . hydroxypropyl methylcellulose (ISOPTO TEARS) 2.5 % ophthalmic solution   Both Eyes   Place 1 drop into both eyes daily as needed (for dry eyes).         . IPRATROPIUM BROMIDE NA   Nasal   Place 2 sprays into the nose daily as needed (for wheezing).         Marland Kitchen losartan (COZAAR) 50 MG tablet   Oral   Take 50 mg by mouth  daily.         . Multiple Vitamins-Minerals (ICAPS PO)   Oral   Take 1 tablet by mouth daily.         . nitroGLYCERIN (NITROSTAT) 0.4 MG SL tablet   Sublingual   Place 0.4 mg under the tongue every 5 (five) minutes as needed for chest pain.         . pantoprazole (PROTONIX) 40 MG tablet   Oral   Take 1 tablet (40 mg total) by mouth daily.   90 tablet   0   . Polyethylene Glycol 3350 (MIRALAX PO)   Oral   Take by mouth.         . potassium chloride SA (K-DUR,KLOR-CON) 20 MEQ tablet   Oral   Take 20 mEq by mouth 2 (two) times daily.         Marland Kitchen PROAIR HFA 108 (90 BASE) MCG/ACT inhaler      INHALE 2 PUFFS INTO THE LUNGS EVERY 6 HOURS AS NEEDED   3 each   0   . sennosides-docusate sodium (SENOKOT-S) 8.6-50 MG tablet   Oral   Take 1 tablet by mouth daily.         Marland Kitchen tiotropium (SPIRIVA) 18 MCG inhalation capsule   Inhalation   Place 18 mcg into inhaler and inhale daily.          . Vitamin D, Ergocalciferol, (DRISDOL) 50000 UNITS CAPS   Oral   Take 50,000 Units by mouth every 14 (fourteen) days.           BP 140/52  Pulse 54  Temp(Src) 97.9 F (36.6 C) (Oral)  Resp 20  SpO2 100%  Physical Exam  Constitutional: She is oriented to person, place, and time. She appears well-developed. No distress.  HENT:  Head: Normocephalic and atraumatic.  Eyes: Pupils are equal, round, and reactive to light. Right eye exhibits no discharge. Left eye exhibits no discharge.  Neck: Neck supple. No tracheal deviation present.  Cardiovascular: Normal rate.  Exam reveals no gallop and no friction rub.   Pulmonary/Chest: No stridor. No respiratory distress. She has no wheezes.  Abdominal: Soft. She exhibits no distension. There is no tenderness. There is no rebound.  Musculoskeletal: She exhibits no edema and no tenderness.  Neurological: She is alert and oriented to person, place, and time.  Skin: She is not diaphoretic.  Pt with ulcerated wound on anterior aspect of  left lower leg. There is some purulent drainage from wound. There is increased warmth. There is soft tissue swelling around wound.     ED Course  Procedures (including critical care time)  Labs Reviewed  CBC WITH DIFFERENTIAL - Abnormal; Notable for the following:    RBC 3.74 (*)    Hemoglobin 11.0 (*)    HCT 34.1 (*)    Eosinophils Relative 7 (*)    All other components within normal limits  BASIC METABOLIC PANEL - Abnormal; Notable for the following:    CO2 34 (*)    GFR calc non Af Amer 47 (*)    GFR calc Af Amer 54 (*)    All other components within normal limits   Dg Tibia/fibula Left  09/28/2012   *RADIOLOGY REPORT*  Clinical Data: Fall, anterior lower leg wound  LEFT TIBIA AND FIBULA - 2 VIEW  Comparison: None.  Findings: Vascular calcifications are noted.  No radiopaque foreign body.  Anterior soft tissue deformity noted over the mid to distal lower leg.  No underlying osseous abnormality.  IMPRESSION: Anterior mid to distal lower leg soft tissue wound, no underlying osseous abnormality.   Original Report Authenticated By: Christiana Pellant, M.D.   MDM  Pt with infected wound that is not improving with outpatient tx of abx. Will give pt clinda here in the Er, and due to the significant bleeding she stated she was having, will admit to make sure improvement in cellulitis and bleeding does not recur. Pt is given PO clinda as same PO availability as IV.  Hospitalist team is consulted and they are admitting pt.   1. Cellulitis   2. Leg wound, left, initial encounter            Bernadene Person, MD 09/28/12 2306

## 2012-09-28 NOTE — ED Notes (Signed)
Per EMS: PT has wound to left anterior lower leg x 5 weeks. Being seen by PCP, receiving 2nd round of abx for current infection to area. Pt reports that wound started spontaneously actively bleeding while sitting in a chair this afternoon. Bleeding controlled at this time. AO x4. Denies pain.

## 2012-09-29 ENCOUNTER — Inpatient Hospital Stay (HOSPITAL_COMMUNITY): Payer: Medicare Other

## 2012-09-29 DIAGNOSIS — I10 Essential (primary) hypertension: Secondary | ICD-10-CM

## 2012-09-29 DIAGNOSIS — J449 Chronic obstructive pulmonary disease, unspecified: Secondary | ICD-10-CM

## 2012-09-29 DIAGNOSIS — I739 Peripheral vascular disease, unspecified: Secondary | ICD-10-CM

## 2012-09-29 DIAGNOSIS — S91009A Unspecified open wound, unspecified ankle, initial encounter: Secondary | ICD-10-CM

## 2012-09-29 DIAGNOSIS — E785 Hyperlipidemia, unspecified: Secondary | ICD-10-CM

## 2012-09-29 DIAGNOSIS — F17200 Nicotine dependence, unspecified, uncomplicated: Secondary | ICD-10-CM

## 2012-09-29 DIAGNOSIS — M79609 Pain in unspecified limb: Secondary | ICD-10-CM

## 2012-09-29 LAB — BASIC METABOLIC PANEL
Chloride: 101 mEq/L (ref 96–112)
GFR calc Af Amer: 61 mL/min — ABNORMAL LOW (ref >90)
GFR calc non Af Amer: 53 mL/min — ABNORMAL LOW (ref >90)
Potassium: 3.8 mEq/L (ref 3.5–5.1)
Sodium: 137 mEq/L (ref 135–145)

## 2012-09-29 LAB — CBC
HCT: 29.8 % — ABNORMAL LOW (ref 36.0–46.0)
Hemoglobin: 9.7 g/dL — ABNORMAL LOW (ref 12.0–15.0)
MCHC: 32.6 g/dL (ref 30.0–36.0)
RBC: 3.29 MIL/uL — ABNORMAL LOW (ref 3.87–5.11)
WBC: 7.9 10*3/uL (ref 4.0–10.5)

## 2012-09-29 LAB — TROPONIN I
Troponin I: <0.3 ng/mL (ref <0.30)
Troponin I: <0.3 ng/mL (ref <0.30)
Troponin I: <0.3 ng/mL (ref <0.30)

## 2012-09-29 MED ORDER — HYPROMELLOSE (GONIOSCOPIC) 2.5 % OP SOLN
1.0000 [drp] | Freq: Every day | OPHTHALMIC | Status: DC | PRN
  Filled 2012-09-29: qty 15

## 2012-09-29 MED ORDER — DOCUSATE SODIUM 100 MG PO CAPS
100.0000 mg | ORAL_CAPSULE | Freq: Two times a day (BID) | ORAL | Status: DC
  Administered 2012-09-29 – 2012-10-01 (×5): 100 mg via ORAL
  Filled 2012-09-29 (×5): qty 1

## 2012-09-29 MED ORDER — VANCOMYCIN HCL 10 G IV SOLR
1500.0000 mg | Freq: Once | INTRAVENOUS | Status: AC
  Administered 2012-09-29: 1500 mg via INTRAVENOUS
  Filled 2012-09-29: qty 1500

## 2012-09-29 MED ORDER — LOSARTAN POTASSIUM 50 MG PO TABS
50.0000 mg | ORAL_TABLET | Freq: Every day | ORAL | Status: DC
  Administered 2012-09-29 – 2012-10-01 (×3): 50 mg via ORAL
  Filled 2012-09-29 (×3): qty 1

## 2012-09-29 MED ORDER — ONDANSETRON HCL 4 MG/2ML IJ SOLN: 4.0000 mg | Freq: Four times a day (QID) | INTRAMUSCULAR | Status: DC | PRN

## 2012-09-29 MED ORDER — SODIUM CHLORIDE 0.9 % IJ SOLN
3.0000 mL | Freq: Two times a day (BID) | INTRAMUSCULAR | Status: DC
  Administered 2012-09-29 – 2012-09-30 (×4): 3 mL via INTRAVENOUS
  Administered 2012-10-01: 10:00:00 via INTRAVENOUS

## 2012-09-29 MED ORDER — BISOPROLOL FUMARATE 10 MG PO TABS
10.0000 mg | ORAL_TABLET | Freq: Every day | ORAL | Status: DC
  Administered 2012-09-29 – 2012-10-01 (×2): 10 mg via ORAL
  Filled 2012-09-29 (×3): qty 1

## 2012-09-29 MED ORDER — DILTIAZEM HCL ER COATED BEADS 240 MG PO CP24
240.0000 mg | ORAL_CAPSULE | Freq: Every day | ORAL | Status: DC
  Administered 2012-09-29 – 2012-10-01 (×2): 240 mg via ORAL
  Filled 2012-09-29 (×3): qty 1

## 2012-09-29 MED ORDER — ENOXAPARIN SODIUM 40 MG/0.4ML ~~LOC~~ SOLN
40.0000 mg | SUBCUTANEOUS | Status: DC
  Administered 2012-09-29 – 2012-10-01 (×3): 40 mg via SUBCUTANEOUS
  Filled 2012-09-29 (×3): qty 0.4

## 2012-09-29 MED ORDER — ATORVASTATIN CALCIUM 40 MG PO TABS
40.0000 mg | ORAL_TABLET | Freq: Every day | ORAL | Status: DC
  Administered 2012-09-29 – 2012-09-30 (×3): 40 mg via ORAL
  Filled 2012-09-29 (×4): qty 1

## 2012-09-29 MED ORDER — ACETAMINOPHEN 650 MG RE SUPP: 650.0000 mg | Freq: Four times a day (QID) | RECTAL | Status: DC | PRN

## 2012-09-29 MED ORDER — ALBUTEROL SULFATE HFA 108 (90 BASE) MCG/ACT IN AERS
2.0000 | INHALATION_SPRAY | Freq: Four times a day (QID) | RESPIRATORY_TRACT | Status: DC | PRN
  Administered 2012-09-30 – 2012-10-01 (×4): 2 via RESPIRATORY_TRACT
  Filled 2012-09-29: qty 6.7

## 2012-09-29 MED ORDER — ONDANSETRON HCL 4 MG PO TABS: 4.0000 mg | ORAL_TABLET | Freq: Four times a day (QID) | ORAL | Status: DC | PRN

## 2012-09-29 MED ORDER — BUDESONIDE-FORMOTEROL FUMARATE 160-4.5 MCG/ACT IN AERO
2.0000 | INHALATION_SPRAY | Freq: Two times a day (BID) | RESPIRATORY_TRACT | Status: DC
  Administered 2012-09-29 – 2012-10-01 (×6): 2 via RESPIRATORY_TRACT
  Filled 2012-09-29: qty 6

## 2012-09-29 MED ORDER — POLYVINYL ALCOHOL 1.4 % OP SOLN
1.0000 [drp] | OPHTHALMIC | Status: DC | PRN
  Filled 2012-09-29: qty 15

## 2012-09-29 MED ORDER — VANCOMYCIN HCL 10 G IV SOLR
1250.0000 mg | INTRAVENOUS | Status: DC
  Administered 2012-09-29 – 2012-09-30 (×2): 1250 mg via INTRAVENOUS
  Filled 2012-09-29 (×4): qty 1250

## 2012-09-29 MED ORDER — SENNOSIDES-DOCUSATE SODIUM 8.6-50 MG PO TABS
1.0000 | ORAL_TABLET | Freq: Every day | ORAL | Status: DC
  Administered 2012-09-29 – 2012-10-01 (×3): 1 via ORAL
  Filled 2012-09-29 (×4): qty 1

## 2012-09-29 MED ORDER — ASPIRIN EC 81 MG PO TBEC
81.0000 mg | DELAYED_RELEASE_TABLET | Freq: Every day | ORAL | Status: DC
  Administered 2012-09-29 – 2012-10-01 (×3): 81 mg via ORAL
  Filled 2012-09-29 (×3): qty 1

## 2012-09-29 MED ORDER — HYDROCODONE-ACETAMINOPHEN 5-325 MG PO TABS: 1.0000 | ORAL_TABLET | ORAL | Status: DC | PRN

## 2012-09-29 MED ORDER — ACETAMINOPHEN 325 MG PO TABS
650.0000 mg | ORAL_TABLET | Freq: Four times a day (QID) | ORAL | Status: DC | PRN
  Administered 2012-09-30: 650 mg via ORAL
  Filled 2012-09-29: qty 2

## 2012-09-29 MED ORDER — PANTOPRAZOLE SODIUM 40 MG PO TBEC
40.0000 mg | DELAYED_RELEASE_TABLET | Freq: Every day | ORAL | Status: DC
  Administered 2012-09-29 – 2012-10-01 (×3): 40 mg via ORAL
  Filled 2012-09-29 (×3): qty 1

## 2012-09-29 MED ORDER — TIOTROPIUM BROMIDE MONOHYDRATE 18 MCG IN CAPS
18.0000 ug | ORAL_CAPSULE | Freq: Every day | RESPIRATORY_TRACT | Status: DC
  Administered 2012-09-29 – 2012-10-01 (×3): 18 ug via RESPIRATORY_TRACT
  Filled 2012-09-29: qty 5

## 2012-09-29 NOTE — Consult Note (Signed)
WOC consult Note Reason for Consult: evaluation of wound on the LLE.  Pt reports she fell several weeks ago and hit this area on a the corner of a brick.  She has had several appts with primary care MD to evaluate the wound and it was initially opened and drained by her primary care MD and oral abtx given.  She reports it just got worse and more redness and swelling was noted therefore she decided to come to ER.  Wound type: non healing trauma wound of the LLE.  Pressure Ulcer POA:No Measurement: 2cm x 1cm x 0.5cm  Wound bed: removed some purulent drainage that was pooling in the base, base is yellow.  Drainage (amount, consistency, odor) yellow, moderate amount Periwound: erythema noted in the LLE, however the staff have marked the area of original erythema, and it is improving. Pt and visitor in the room report less swelling also   Dressing procedure/placement/frequency: silver hydrofiber cut to fit in the wound bed for absorption of drainage and antimicrobial effects. Cover with dry dressing, change every other day.   Discussed POC with pt and with bedside nurse. Bedside nurse to place dressing when available on the floor.  Re consult if needed, will not follow at this time. Thanks  Taeko Schaffer [employer], CWOCN [phone])

## 2012-09-29 NOTE — Progress Notes (Signed)
VASCULAR LAB PRELIMINARY  ARTERIAL  ABI completed:    RIGHT    LEFT    PRESSURE WAVEFORM  PRESSURE WAVEFORM  BRACHIAL 142  Triphasic BRACHIAL 116 Biphasic  AT 72 Severely dampened monophasic AT 59 Severely dampened monophasic  PT 73  Monophasic PT 91  Dampened monophasic    RIGHT LEFT  ABI 0.51 0.64   ABIs on the right indicate a moderate to severe reduction in arterial flow. Left ABIs indicate a moderate reduction in arterial flow. However Abnormal Doppler waveforms would suggest a false elevation in pressures which would adversely effect the ABI results. Also noted in a right to left brachial pressure difference of 26 mm Hg consistent with a left subclavian steal or arterial obstruction  [name], RVS 09/29/2012, 8:10 PM

## 2012-09-29 NOTE — Progress Notes (Signed)
Utilization review completed. Yanis Juma, RN, BSN. 

## 2012-09-29 NOTE — Progress Notes (Signed)
[facility] PROGRESS NOTE  [name] ZOX:[id number] DOB: 1930-02-25 DOA: 09/28/2012 PCP: Kaleen Mask, MD  Assessment/Plan: Cellulitis of the left lower extremity-  Continue vancomycin -follow wound care services instructions -Start patient on intravenous vancomycin  -I believe the patient's slow wound healing may be partly contributed to the patient's peripheral vascular disease. Will follow ABI -supportive care and PRN pain meds  Hypertension  -stable -Continue bisoprolol, Cardizem CD, losartan   Hyperlipidemia  -Continue atorvastatin   Coronary artery disease  -Continue aspirin   Chronic resp failure due to COPD  -Compensated at this time  -Continue Spiriva  -Albuterol nebulizer PRN for shortness breath and wheezing -continue 2L oxygen supplementation.   Dyspnea on exertion  -troponin neg X3 -EKG w/o acute ischemic changes -Chest x-ray not showing acute infiltrates -probably slight bronchitis/bronchiectasis. -vancomycin will also protect her lungs  GERD: -continue PPI  Tobacco abuse  -Tobacco cessation discussed  WJX:[id number]  Code Status: Full Family Communication: no family at bedside Disposition Plan: home when medically stable   Consultants:  Wound care service  Procedures:  See below for x-ray reports  Antibiotics:  vancomycin  HPI/Subjective: Afebrile, no acute distress. Complaining of pain and erythema on LLE.  Objective: Filed Vitals:   09/29/12 0110 09/29/12 0631 09/29/12 1018 09/29/12 1323  BP:  135/53 106/47 125/61  Pulse:  63 69 64  Temp:  97.7 F (36.5 C) 97.9 F (36.6 C) 98.4 F (36.9 C)  TempSrc:  Oral    Resp:  18 20 18   Height:      Weight:      SpO2: 94% 100% 98% 95%    Intake/Output Summary (Last 24 hours) at 09/29/12 1540 Last data filed at 09/29/12 1324  Gross per 24 hour  Intake   1240 ml  Output    350 ml  Net    890 ml   Filed Weights   09/29/12 0002  Weight: 82.5 kg (181 lb 14.1 oz)     Exam:   General:  Afebrile, NAD, able to speak in full sentences; McN[place] in place  Cardiovascular: S1 and S2, no rubs or gallops  Respiratory: no wheezing, scattered rhonchi  Abdomen: soft, NT, ND, positive BS  Extremities: LLE with mid tibial open wound, yellowish discharge and erythema around the wound and extending up to mid leg.  Neuro: non focal.  Data Reviewed: Basic Metabolic Panel:  Recent Labs Lab 09/28/12 1905 09/29/12 0450  NA 138 137  K 4.0 3.8  CL 99 101  CO2 34* 31  GLUCOSE 90 108*  BUN 21 21  CREATININE 1.08 0.98  CALCIUM 10.1 9.5   CBC:  Recent Labs Lab 09/28/12 1905 09/29/12 0450  WBC 7.0 7.9  NEUTROABS 4.7  --   HGB 11.0* 9.7*  HCT 34.1* 29.8*  MCV 91.2 90.6  PLT 208 192   Cardiac Enzymes:  Recent Labs Lab 09/29/12 0026 09/29/12 0450  TROPONINI <0.30 <0.30   BNP (last 3 results)  Recent Labs  02/06/12 0747 02/13/12 1210  PROBNP 334.1 1239.0*    Studies: Dg Chest 2 View  09/29/2012   *RADIOLOGY REPORT*  Clinical Data: Shortness of breath  CHEST - 2 VIEW  Comparison: Portable chest x-ray of 02/13/2012  Findings: The lungs are hyperaerated consistent with emphysema.  No focal infiltrate or effusion is seen.  The heart is within upper limits of normal.  No acute bony abnormality is seen.  Median sternotomy sutures are noted from prior CABG.  IMPRESSION: Emphysema.  No active  lung disease.   Original Report Authenticated By: Dwyane Dee, M.D.   Dg Tibia/fibula Left  09/28/2012   *RADIOLOGY REPORT*  Clinical Data: Fall, anterior lower leg wound  LEFT TIBIA AND FIBULA - 2 VIEW  Comparison: None.  Findings: Vascular calcifications are noted.  No radiopaque foreign body.  Anterior soft tissue deformity noted over the mid to distal lower leg.  No underlying osseous abnormality.  IMPRESSION: Anterior mid to distal lower leg soft tissue wound, no underlying osseous abnormality.   Original Report Authenticated By: Christiana Pellant, M.D.     Scheduled Meds: . aspirin EC  81 mg Oral Daily  . atorvastatin  40 mg Oral QHS  . bisoprolol  10 mg Oral Daily  . budesonide-formoterol  2 puff Inhalation BID  . diltiazem  240 mg Oral Daily  . docusate sodium  100 mg Oral BID  . enoxaparin (LOVENOX) injection  40 mg Subcutaneous Q24H  . losartan  50 mg Oral Daily  . pantoprazole  40 mg Oral Daily  . senna-docusate  1 tablet Oral Daily  . sodium chloride  3 mL Intravenous Q12H  . tiotropium  18 mcg Inhalation Daily  . vancomycin  1,250 mg Intravenous Q24H   Continuous Infusions:   Active Problems:   Cellulitis, leg    Time spent: >30 minutes   [name]  [facility] Pager [phone]. If 7PM-7AM, please contact night-coverage at [email or web address], password [facility] 09/29/2012, 3:40 PM  LOS: 1 day

## 2012-09-29 NOTE — Progress Notes (Signed)
ANTIBIOTIC CONSULT NOTE - INITIAL  Pharmacy Consult for Vancomycin Indication: cellulitis  Allergies  Allergen Reactions  . Codeine Other (See Comments)    unknown  . Ranitidine Hcl Other (See Comments)    unknown  . Sulfonamide Derivatives Nausea And Vomiting  . Penicillins Rash and Other (See Comments)    unknown    Patient Measurements: Height: 5' 10.87" (180 cm) Weight: 181 lb 14.1 oz (82.5 kg) IBW/kg (Calculated) : 70.49  Vital Signs: Temp: 97.7 F (36.5 C) (06/05 0002) Temp src: Oral (06/05 0002) BP: 145/50 mmHg (06/05 0002) Pulse Rate: 66 (06/05 0002) Labs:  Recent Labs  09/28/12 1905  WBC 7.0  HGB 11.0*  PLT 208  CREATININE 1.08   Estimated Creatinine Clearance: 45.5 ml/min (by C-G formula based on Cr of 1.08). No results found for this basename: VANCOTROUGH, VANCOPEAK, VANCORANDOM, GENTTROUGH, GENTPEAK, GENTRANDOM, TOBRATROUGH, TOBRAPEAK, TOBRARND, AMIKACINPEAK, AMIKACINTROU, AMIKACIN,  in the last 72 hours   Microbiology: No results found for this or any previous visit (from the past 720 hour(s)).  Medical History: Past Medical History  Diagnosis Date  . CAD (coronary artery disease)     a. s/p CABG x5 (2005) b. s/p BMS-prox SVG-D2 (2014)  . Hypertension   . Hyperlipidemia   . GI bleed     AVMs  . Aneurysm, aortic     thoracic aorta, stable at 4.1 cm, chest CT, May, 2012  . Syncope     Nitroglycerin plus a diuretic, April, 2009  . Hyponatremia     Chronic. Felt secondary to SIADH   . COPD (chronic obstructive pulmonary disease)   . Tobacco abuse   . Bradycardia   . Carotid artery disease     Hx of endarterectomy. Doppler October, 2011, stable, 0-39% RIC A., 40-59% LICA  . Tuberculosis     Exposures to tuberculosis 1970s, has tested negative by the health Department  . Atrial fibrillation     Paroxysmal with RVR 07/2011, not felt to be a coumadin candidate secondary to hx of GIB/AVM  . Venous stasis of lower extremity     Chronic  .  Ejection fraction     a. EF 55-60%, echo, April, 2013 b. EF 55-60%, mild biatrial enlargement and PASP 42 mmH  . CHF with left ventricular diastolic dysfunction, NYHA class 2   . AAA (abdominal aortic aneurysm)   . On home O2     2L N/C  . Complication of anesthesia   . Shortness of breath   . Hx of CABG     CABG 2005  . Hematoma     Right groin hematoma, small, post cath, April, 2014  . Mitral regurgitation     Mild, hospital, March, 2014  . Leg ulcer     Ulcerated lesion on the anterior aspect of her left lower leg, June, 2014    Medications:  Prescriptions prior to admission  Medication Sig Dispense Refill  . albuterol (PROVENTIL HFA;VENTOLIN HFA) 108 (90 BASE) MCG/ACT inhaler Inhale 2 puffs into the lungs every 6 (six) hours as needed for wheezing.      Marland Kitchen aspirin EC 81 MG tablet Take 81 mg by mouth daily.      Marland Kitchen atorvastatin (LIPITOR) 40 MG tablet Take 1 tablet (40 mg total) by mouth at bedtime.  90 tablet  0  . bisoprolol (ZEBETA) 10 MG tablet Take 10 mg by mouth daily.      . budesonide-formoterol (SYMBICORT) 160-4.5 MCG/ACT inhaler Inhale 2 puffs into the lungs 2 (two)  times daily.      Marland Kitchen diltiazem (CARDIZEM CD) 240 MG 24 hr capsule Take 240 mg by mouth daily.      Marland Kitchen docusate sodium (COLACE) 100 MG capsule Take 100 mg by mouth 2 (two) times daily.      . furosemide (LASIX) 40 MG tablet Take 40 mg by mouth daily.      . hydroxypropyl methylcellulose (ISOPTO TEARS) 2.5 % ophthalmic solution Place 1 drop into both eyes daily as needed (for dry eyes).      . IPRATROPIUM BROMIDE NA Place 2 sprays into the nose daily as needed (for wheezing).      Marland Kitchen losartan (COZAAR) 50 MG tablet Take 50 mg by mouth daily.      . Multiple Vitamins-Minerals (ICAPS PO) Take 1 tablet by mouth daily.      . nitroGLYCERIN (NITROSTAT) 0.4 MG SL tablet Place 0.4 mg under the tongue every 5 (five) minutes as needed for chest pain.      . pantoprazole (PROTONIX) 40 MG tablet Take 1 tablet (40 mg total) by  mouth daily.  90 tablet  0  . Polyethylene Glycol 3350 (MIRALAX PO) Take by mouth.      . potassium chloride SA (K-DUR,KLOR-CON) 20 MEQ tablet Take 20 mEq by mouth 2 (two) times daily.      Marland Kitchen PROAIR HFA 108 (90 BASE) MCG/ACT inhaler INHALE 2 PUFFS INTO THE LUNGS EVERY 6 HOURS AS NEEDED  3 each  0  . sennosides-docusate sodium (SENOKOT-S) 8.6-50 MG tablet Take 1 tablet by mouth daily.      Marland Kitchen tiotropium (SPIRIVA) 18 MCG inhalation capsule Place 18 mcg into inhaler and inhale daily.       . Vitamin D, Ergocalciferol, (DRISDOL) 50000 UNITS CAPS Take 50,000 Units by mouth every 7 (seven) days. sundays       Assessment: 79 yo female with LLE cellulitis for empiric antibiotics  Goal of Therapy:  Vancomycin trough level 10-15 mcg/ml  Plan:  Vancomycin 1500 mg IV now, then 1250 mg IV q24h  Eddie Candle 09/29/2012,12:04 AM

## 2012-09-29 NOTE — ED Provider Notes (Signed)
I saw and evaluated the patient, reviewed the resident's note and I agree with the findings and plan.  The patient presents with complaints of left leg swelling, redness and pain for the past two weeks after bumping her leg on a brick wall.  She has been treated with doxycycline, however is not improving.  She denies fevers or chills.  She did have an [place] as an outpatient to rule out dvt.    On exam, the vitals are stable and the patient is afebrile.  The heart is regular rate and rhythm and the lungs are clear and equal.  There is a wound to the anterior aspect of the left lower leg at about 2/3 of the way to the foot.  There is surrounding erythema, warmth, swelling, and tenderness to the touch.  This extends distal and proximal to the wound.  The DP and PT pulses are palpable.  This appears to be cellulitis that is not responding to po antibiotics.  She is afebrile and there is no wbc, however given the appearance of the wound, I feel as though admission is indicated.  Given clindamycin in the ED and will consult medicine for admission.  Geoffery Lyons, MD 09/29/12 [phone]

## 2012-09-30 LAB — GLUCOSE, CAPILLARY: Glucose-Capillary: 98 mg/dL (ref 70–99)

## 2012-09-30 NOTE — Progress Notes (Addendum)
[facility] PROGRESS NOTE  [name] ZOX:[id number] DOB: Dec 18, 1929 DOA: 09/28/2012 PCP: Kaleen Mask, MD  Assessment/Plan: Cellulitis of the left lower extremity-  -will continue one more day of IV vancomycin; subsequent tx with cleocin for 10 days -continue WOC recommendations. -I believe the patient's slow wound healing may be partly contributed to the patient's peripheral vascular disease, which has been confirmed by ABI (moderate PVD on LLE) -supportive care and PRN pain meds  Hypertension  -stable -Continue bisoprolol, Cardizem CD, losartan   Hyperlipidemia  -Continue atorvastatin   Coronary artery disease  -Continue aspirin and statins; no CP  Chronic resp failure due to COPD  -Compensated at this time  -Continue Spiriva  -Albuterol nebulizer PRN for shortness breath and wheezing -continue 2L oxygen supplementation.   Dyspnea on exertion  -troponin neg X3 -EKG w/o acute ischemic changes -Chest x-ray not showing acute infiltrates -probably slight bronchitis/bronchiectasis. -continue vancomycin while inpatient; will discharge on cleocin. -continue home regimen for COPD  GERD: -continue PPI  Tobacco abuse  -Tobacco cessation discussed  Moderate PAD on LLE: will arrange follow up with vascular surgery as an outpatient.  WJX:[id number]  Code Status: Full Family Communication: no family at bedside Disposition Plan: home when medically stable   Consultants:  Wound care service  Procedures:  See below for x-ray reports  Antibiotics:  vancomycin  HPI/Subjective: Afebrile, no acute distress. Improved in pain and erythema of LLE.  Objective: Filed Vitals:   09/29/12 2147 09/30/12 0452 09/30/12 1104 09/30/12 1432  BP:  141/67  120/50  Pulse:  58  64  Temp:  97.7 F (36.5 C)  98.2 F (36.8 C)  TempSrc:  Oral  Oral  Resp:  18  20  Height:      Weight:      SpO2: 97% 98% 100% 100%    Intake/Output Summary (Last 24 hours) at  09/30/12 1809 Last data filed at 09/30/12 1433  Gross per 24 hour  Intake    240 ml  Output    450 ml  Net   -210 ml   Filed Weights   09/29/12 0002  Weight: 82.5 kg (181 lb 14.1 oz)    Exam:   General:  Afebrile, NAD, able to speak in full sentences; [place] oxygen in place  Cardiovascular: S1 and S2, no rubs or gallops  Respiratory: no wheezing, scattered rhonchi  Abdomen: soft, NT, ND, positive BS  Extremities: LLE with mid tibial open wound, yellowish discharge and erythema around the wound and extending up to mid leg.  Neuro: non focal.  Data Reviewed: Basic Metabolic Panel:  Recent Labs Lab 09/28/12 1905 09/29/12 0450  NA 138 137  K 4.0 3.8  CL 99 101  CO2 34* 31  GLUCOSE 90 108*  BUN 21 21  CREATININE 1.08 0.98  CALCIUM 10.1 9.5   CBC:  Recent Labs Lab 09/28/12 1905 09/29/12 0450  WBC 7.0 7.9  NEUTROABS 4.7  --   HGB 11.0* 9.7*  HCT 34.1* 29.8*  MCV 91.2 90.6  PLT 208 192   Cardiac Enzymes:  Recent Labs Lab 09/29/12 0026 09/29/12 0450 09/29/12 1201  TROPONINI <0.30 <0.30 <0.30   BNP (last 3 results)  Recent Labs  02/06/12 0747 02/13/12 1210  PROBNP 334.1 1239.0*    Studies: Dg Chest 2 View  09/29/2012   *RADIOLOGY REPORT*  Clinical Data: Shortness of breath  CHEST - 2 VIEW  Comparison: Portable chest x-ray of 02/13/2012  Findings: The lungs are hyperaerated consistent with emphysema.  No focal infiltrate or effusion is seen.  The heart is within upper limits of normal.  No acute bony abnormality is seen.  Median sternotomy sutures are noted from prior CABG.  IMPRESSION: Emphysema.  No active lung disease.   Original Report Authenticated By: Dwyane Dee, M.D.   Dg Tibia/fibula Left  09/28/2012   *RADIOLOGY REPORT*  Clinical Data: Fall, anterior lower leg wound  LEFT TIBIA AND FIBULA - 2 VIEW  Comparison: None.  Findings: Vascular calcifications are noted.  No radiopaque foreign body.  Anterior soft tissue deformity noted over the mid to  distal lower leg.  No underlying osseous abnormality.  IMPRESSION: Anterior mid to distal lower leg soft tissue wound, no underlying osseous abnormality.   Original Report Authenticated By: Christiana Pellant, M.D.    Scheduled Meds: . aspirin EC  81 mg Oral Daily  . atorvastatin  40 mg Oral QHS  . bisoprolol  10 mg Oral Daily  . budesonide-formoterol  2 puff Inhalation BID  . diltiazem  240 mg Oral Daily  . docusate sodium  100 mg Oral BID  . enoxaparin (LOVENOX) injection  40 mg Subcutaneous Q24H  . losartan  50 mg Oral Daily  . pantoprazole  40 mg Oral Daily  . senna-docusate  1 tablet Oral Daily  . sodium chloride  3 mL Intravenous Q12H  . tiotropium  18 mcg Inhalation Daily  . vancomycin  1,250 mg Intravenous Q24H   Continuous Infusions:   Active Problems:   Cellulitis, leg    Time spent: >30 minutes   Deysy Schabel  [facility] Pager [phone]. If 7PM-7AM, please contact night-coverage at [email or web address], password [facility] 09/30/2012, 6:09 PM  LOS: 2 days

## 2012-10-01 LAB — CBC
MCH: 29.2 pg (ref 26.0–34.0)
Platelets: 172 10*3/uL (ref 150–400)
RBC: 3.39 MIL/uL — ABNORMAL LOW (ref 3.87–5.11)
WBC: 6.3 10*3/uL (ref 4.0–10.5)

## 2012-10-01 LAB — BASIC METABOLIC PANEL
CO2: 25 mEq/L (ref 19–32)
Calcium: 9.5 mg/dL (ref 8.4–10.5)
Potassium: 3.8 mEq/L (ref 3.5–5.1)
Sodium: 132 mEq/L — ABNORMAL LOW (ref 135–145)

## 2012-10-01 MED ORDER — CLINDAMYCIN HCL 300 MG PO CAPS: 300.0000 mg | ORAL_CAPSULE | Freq: Three times a day (TID) | ORAL | Status: AC

## 2012-10-01 MED ORDER — HYDROCODONE-ACETAMINOPHEN 5-325 MG PO TABS: 1.0000 | ORAL_TABLET | Freq: Four times a day (QID) | ORAL | Status: DC | PRN

## 2012-10-01 NOTE — Discharge Summary (Signed)
Physician Discharge Summary  [name] GNF:[id number] DOB: 1929/11/13 DOA: 09/28/2012  PCP: Kaleen Mask, MD  Admit date: 09/28/2012 Discharge date: 10/01/2012  Time spent: >30 minutes  Recommendations for Outpatient Follow-up:  1. BMET to follow electrolytes and kidney function 2. Follow wound and patient compliance with wound care clinic appointment  Discharge Diagnoses:  Cellulitis, leg LLE Open wound PAD HTN HLD Chronic Resp failure GERD COPD  Discharge Condition: stable and improved.  Diet recommendation: heart healthy diet  Filed Weights   09/29/12 0002  Weight: 82.5 kg (181 lb 14.1 oz)    History of present illness:  79 year old female with a history of hypertension, hyperlipidemia, coronary artery disease status post bare metal stent (09/17/2012), and GI bleed presents with increasing pain and bleeding around her left leg wound. According to the patient, the leg wound started approximately 5-6 weeks ago when the patient hit the corner of a brick developed an ulceration.. The patient went to see her primary care physician approximately 3 weeks ago. Apparently, her primary care provider performed an office I&D at that time. The patient was placed on doxycycline x10 days. The redness improved. However the patient noted increasing redness and pain approximately one week ago. The patient was started on a second course of doxycycline on Friday, 09/23/2012. The patient complained of on a daily worsening of erythema from her left lower extremity wound. Unfortunately, today, the patient noted some bleeding around the left leg wound, and has noted increasing warmth and redness to the wound. The patient denied any new trauma to her leg. As a result, the patient came to the emergency department for further evaluation.    Hospital Course:  Cellulitis of the left lower extremity-  -Tx with cleocin to complete 10 days  -continue WOC recommendations and will arrange visit at  wound care clinic. -I believe the patient's slow wound healing may be partly contributed to the patient's peripheral vascular disease, which has been confirmed by ABI (moderate PVD on LLE)  -PRN analgesics  Hypertension  -stable  -Continue bisoprolol, Cardizem CD, losartan   Hyperlipidemia  -Continue atorvastatin   Coronary artery disease  -Continue aspirin and statins; no CP   Chronic resp failure due to COPD  -Compensated at this time  -Continue Spiriva  -Albuterol nebulizer PRN for shortness breath and wheezing  -continue 2L oxygen supplementation.   Dyspnea on exertion  -troponin neg X3  -EKG w/o acute ischemic changes  -Chest x-ray not showing acute infiltrates  -probably slight bronchitis/bronchiectasis.  -will treat with cleocin as mentioned above. -continue home regimen for COPD   GERD:  -continue PPI   Tobacco abuse  -Tobacco cessation discussed   Moderate PAD on LLE: will arrange follow up with vascular surgery as an outpatient.  *Rest of medical problems remains stable and plan is for patient to continue current medication regimen.  Procedures: ABI (demonstarted moderate Left lower extremity PAD) LE dopplers (no DVT)  Consultations:  Wound care service  Discharge Exam: Filed Vitals:   09/30/12 1432 09/30/12 2032 09/30/12 2215 10/01/12 0542  BP: 120/50  115/48 155/66  Pulse: 64  63 65  Temp: 98.2 F (36.8 C)  98.2 F (36.8 C) 97.9 F (36.6 C)  TempSrc: Oral  Oral Oral  Resp: 20  18 16   Height:      Weight:      SpO2: 100% 99% 97% 97%   General: Afebrile, NAD, able to speak in full sentences; [place] oxygen in place  Cardiovascular: S1 and S2,  no rubs or gallops  Respiratory: no wheezing, scattered rhonchi  Abdomen: soft, NT, ND, positive BS  Extremities: LLE with mid tibial open wound, no discharges, clean dressings and mild erythema around the wound and extending up to mid leg (significant better than on admission).  Neuro: non  focal.  Discharge Instructions  Discharge Orders   Future Appointments Provider Department Dept Phone   12/29/2012 2:30 PM Luis Abed, MD [facility] Main Office [place]) [phone]   Future Orders Complete By Expires     Diet - low sodium heart healthy  As directed     Discharge instructions  As directed     Comments:      Arrange follow up with PCP in 10 days Take medications as prescribed Follow a low sodium diet Follow wound care instructions and appointment at wound care clinic Will also arrange follow up with vascular surgery given abnormal findings with arterial dopplers; please be compliant with appointment.        Medication List    TAKE these medications       PROAIR HFA 108 (90 BASE) MCG/ACT inhaler  Generic drug:  albuterol  INHALE 2 PUFFS INTO THE LUNGS EVERY 6 HOURS AS NEEDED     albuterol 108 (90 BASE) MCG/ACT inhaler  Commonly known as:  PROVENTIL HFA;VENTOLIN HFA  Inhale 2 puffs into the lungs every 6 (six) hours as needed for wheezing.     aspirin EC 81 MG tablet  Take 81 mg by mouth daily.     atorvastatin 40 MG tablet  Commonly known as:  LIPITOR  Take 1 tablet (40 mg total) by mouth at bedtime.     bisoprolol 10 MG tablet  Commonly known as:  ZEBETA  Take 10 mg by mouth daily.     budesonide-formoterol 160-4.5 MCG/ACT inhaler  Commonly known as:  SYMBICORT  Inhale 2 puffs into the lungs 2 (two) times daily.     clindamycin 300 MG capsule  Commonly known as:  CLEOCIN  Take 1 capsule (300 mg total) by mouth 3 (three) times daily.     diltiazem 240 MG 24 hr capsule  Commonly known as:  CARDIZEM CD  Take 240 mg by mouth daily.     docusate sodium 100 MG capsule  Commonly known as:  COLACE  Take 100 mg by mouth 2 (two) times daily.     furosemide 40 MG tablet  Commonly known as:  LASIX  Take 40 mg by mouth daily.     HYDROcodone-acetaminophen 5-325 MG per tablet  Commonly known as:  NORCO/VICODIN  Take 1 tablet by mouth every 6  (six) hours as needed.     hydroxypropyl methylcellulose 2.5 % ophthalmic solution  Commonly known as:  ISOPTO TEARS  Place 1 drop into both eyes daily as needed (for dry eyes).     ICAPS PO  Take 1 tablet by mouth daily.     IPRATROPIUM BROMIDE NA  Place 2 sprays into the nose daily as needed (for wheezing).     losartan 50 MG tablet  Commonly known as:  COZAAR  Take 50 mg by mouth daily.     MIRALAX PO  Take by mouth.     nitroGLYCERIN 0.4 MG SL tablet  Commonly known as:  NITROSTAT  Place 0.4 mg under the tongue every 5 (five) minutes as needed for chest pain.     pantoprazole 40 MG tablet  Commonly known as:  PROTONIX  Take 1 tablet (40 mg total)  by mouth daily.     potassium chloride SA 20 MEQ tablet  Commonly known as:  K-DUR,KLOR-CON  Take 20 mEq by mouth 2 (two) times daily.     sennosides-docusate sodium 8.6-50 MG tablet  Commonly known as:  SENOKOT-S  Take 1 tablet by mouth daily.     tiotropium 18 MCG inhalation capsule  Commonly known as:  SPIRIVA  Place 18 mcg into inhaler and inhale daily.     Vitamin D (Ergocalciferol) 50000 UNITS Caps  Commonly known as:  DRISDOL  Take 50,000 Units by mouth every 7 (seven) days. sundays       Allergies  Allergen Reactions  . Codeine Other (See Comments)    unknown  . Ranitidine Hcl Other (See Comments)    unknown  . Sulfonamide Derivatives Nausea And Vomiting  . Penicillins Rash and Other (See Comments)    unknown       Follow-up Information   Call [facility]             . (call to make appointment)    Contact information:   [address] [place] [address] [phone]      Follow up with Kaleen Mask, MD. Schedule an appointment as soon as possible for a visit in 10 days.   Contact information:   [address] [place] [address] [phone]       Call [facility]. (to arrange appointment for follow up on  your abnormal ABI's)    Contact information:   [address] [place] [address] [phone]       The results of significant diagnostics from this hospitalization (including imaging, microbiology, ancillary and laboratory) are listed below for reference.    Significant Diagnostic Studies: Dg Chest 2 View  09/29/2012   *RADIOLOGY REPORT*  Clinical Data: Shortness of breath  CHEST - 2 VIEW  Comparison: Portable chest x-ray of 02/13/2012  Findings: The lungs are hyperaerated consistent with emphysema.  No focal infiltrate or effusion is seen.  The heart is within upper limits of normal.  No acute bony abnormality is seen.  Median sternotomy sutures are noted from prior CABG.  IMPRESSION: Emphysema.  No active lung disease.   Original Report Authenticated By: Dwyane Dee, M.D.   Dg Tibia/fibula Left  09/28/2012   *RADIOLOGY REPORT*  Clinical Data: Fall, anterior lower leg wound  LEFT TIBIA AND FIBULA - 2 VIEW  Comparison: None.  Findings: Vascular calcifications are noted.  No radiopaque foreign body.  Anterior soft tissue deformity noted over the mid to distal lower leg.  No underlying osseous abnormality.  IMPRESSION: Anterior mid to distal lower leg soft tissue wound, no underlying osseous abnormality.   Original Report Authenticated By: Christiana Pellant, M.D.   Labs: Basic Metabolic Panel:  Recent Labs Lab 09/28/12 1905 09/29/12 0450 10/01/12 0645  NA 138 137 132*  K 4.0 3.8 3.8  CL 99 101 98  CO2 34* 31 25  GLUCOSE 90 108* 102*  BUN 21 21 11   CREATININE 1.08 0.98 0.87  CALCIUM 10.1 9.5 9.5   CBC:  Recent Labs Lab 09/28/12 1905 09/29/12 0450 10/01/12 0645  WBC 7.0 7.9 6.3  NEUTROABS 4.7  --   --   HGB 11.0* 9.7* 9.9*  HCT 34.1* 29.8* 29.9*  MCV 91.2 90.6 88.2  PLT 208 192 172   Cardiac Enzymes:  Recent Labs Lab 09/29/12 0026 09/29/12 0450 09/29/12 1201  TROPONINI <0.30 <0.30 <0.30  BNP: BNP (last 3 results)  Recent Labs  02/06/12 0747 02/13/12 1210   PROBNP 334.1 1239.0*   CBG:  Recent Labs Lab 09/30/12 2213  GLUCAP 98    Signed:  Jasey Cortez  [facility] 10/01/2012, 12:41 PM

## 2012-10-03 ENCOUNTER — Ambulatory Visit: Payer: Medicare Other | Admitting: Pulmonary Disease

## 2012-10-12 ENCOUNTER — Encounter (HOSPITAL_BASED_OUTPATIENT_CLINIC_OR_DEPARTMENT_OTHER): Payer: Medicare Other | Attending: General Surgery

## 2012-10-12 ENCOUNTER — Other Ambulatory Visit: Payer: Self-pay | Admitting: General Surgery

## 2012-10-12 DIAGNOSIS — J449 Chronic obstructive pulmonary disease, unspecified: Secondary | ICD-10-CM | POA: Insufficient documentation

## 2012-10-12 DIAGNOSIS — L97909 Non-pressure chronic ulcer of unspecified part of unspecified lower leg with unspecified severity: Secondary | ICD-10-CM | POA: Insufficient documentation

## 2012-10-12 DIAGNOSIS — I10 Essential (primary) hypertension: Secondary | ICD-10-CM | POA: Insufficient documentation

## 2012-10-12 DIAGNOSIS — Z87891 Personal history of nicotine dependence: Secondary | ICD-10-CM | POA: Insufficient documentation

## 2012-10-12 DIAGNOSIS — K219 Gastro-esophageal reflux disease without esophagitis: Secondary | ICD-10-CM | POA: Insufficient documentation

## 2012-10-12 DIAGNOSIS — Z96649 Presence of unspecified artificial hip joint: Secondary | ICD-10-CM | POA: Insufficient documentation

## 2012-10-12 DIAGNOSIS — I739 Peripheral vascular disease, unspecified: Secondary | ICD-10-CM

## 2012-10-12 DIAGNOSIS — J4489 Other specified chronic obstructive pulmonary disease: Secondary | ICD-10-CM | POA: Insufficient documentation

## 2012-10-12 DIAGNOSIS — Z951 Presence of aortocoronary bypass graft: Secondary | ICD-10-CM | POA: Insufficient documentation

## 2012-10-12 DIAGNOSIS — Z9089 Acquired absence of other organs: Secondary | ICD-10-CM | POA: Insufficient documentation

## 2012-10-12 DIAGNOSIS — E785 Hyperlipidemia, unspecified: Secondary | ICD-10-CM | POA: Insufficient documentation

## 2012-10-12 DIAGNOSIS — Z79899 Other long term (current) drug therapy: Secondary | ICD-10-CM | POA: Insufficient documentation

## 2012-10-12 NOTE — H&P (Signed)
NAME:  [name], [name] NO.:  [id number]  MEDICAL RECORD NO.:  [id number]  LOCATION:  FOOT                         FACILITY:  [facility]  PHYSICIAN:  Joanne Gavel, M.D.        DATE OF BIRTH:  12-19-1929  DATE OF ADMISSION:  10/12/2012 DATE OF DISCHARGE:                             HISTORY & PHYSICAL   CHIEF COMPLAINT:  Wound, left leg.  HISTORY OF PRESENT ILLNESS:  This 79 year old female, nondiabetic, traumatized approximately 8 weeks ago.  She was treated for cellulitis with clindamycin and doxycycline.  She is not aware of what local treatments were done.  Approximately 2 weeks ago, she was admitted to the hospital when she had a bleeding episode from the wound.  She was in the hospital for 4 days.  After discharge, she was sent to see [place].  PAST MEDICAL HISTORY: 1. Chronic respiratory failure. 2. COPD. 3. GERD. 4. Hyperlipidemia. 5. Hypertension. 6. Peripheral vascular disease. 7. She has also had a heart surgery.  PAST SURGICAL HISTORY: 1. CABG. 2. Multiple stent placements. 3. Hip replacement. 4. Hysterectomy. 5. Tonsillectomy. 6. Cataract surgery. 7. Cholecystectomy. 8. Hemorrhoidectomy.  CIGARETTES:  She quit several months ago after an episode of hospitalization for COPD.  ALCOHOL:  None.  MEDICATIONS:  ProAir, albuterol, aspirin, atorvastatin, bisoprolol, Symbicort, clindamycin, diltiazem, docusate, furosemide, hydrocodone, ICaps, Isopto, ipratropium, losartan, MiraLAX, Protonix, potassium, Spiriva, and vitamin D.  ALLERGIES:  CODEINE, RANITIDINE, SULFA, and PENICILLIN.  REVIEW OF SYSTEMS:  As above.  PHYSICAL EXAMINATION:  VITAL SIGNS:  Temperature 98.2, pulse 56, respirations 16, blood pressure 150/64. GENERAL APPEARANCE:  Well developed.  She is receiving oxygen.  She appears somewhat short of breath.  She has multiple bruises on her skin. CHEST:  Decreased breath sounds. HEART:  Regular rhythm. ABDOMEN:  Not examined. EXTREMITIES:  Examination of the left lower extremity reveals a 2.0 x 1.0 x 3.0 wound anteriorly.  The dorsalis pedis and posterior pulses tibial pulses are not palpable, although the ABI is measured at 0.9.  IMPRESSION:  Some chronic venous hypertension with ulceration secondary to trauma, possible peripheral arterial disease.  We will start with arterial and venous studies and we will start with Santyl and Hydrogel. See the patient in 7 days.     Joanne Gavel, M.D.     RA/MEDQ  D:  10/12/2012  T:  10/12/2012  Job:  [id number]

## 2012-10-19 ENCOUNTER — Other Ambulatory Visit: Payer: Self-pay | Admitting: *Deleted

## 2012-10-19 MED ORDER — FUROSEMIDE 40 MG PO TABS: 40.0000 mg | ORAL_TABLET | Freq: Every day | ORAL | Status: DC

## 2012-10-21 ENCOUNTER — Ambulatory Visit (HOSPITAL_COMMUNITY)
Admission: RE | Admit: 2012-10-21 | Discharge: 2012-10-21 | Disposition: A | Discharge status: Home / Self Care | Payer: Medicare Other | Source: Ambulatory Visit | Attending: Cardiovascular Disease | Admitting: Cardiovascular Disease

## 2012-10-21 ENCOUNTER — Telehealth: Payer: Self-pay | Admitting: Pulmonary Disease

## 2012-10-21 ENCOUNTER — Ambulatory Visit (HOSPITAL_BASED_OUTPATIENT_CLINIC_OR_DEPARTMENT_OTHER)
Admission: RE | Admit: 2012-10-21 | Discharge: 2012-10-21 | Disposition: A | Discharge status: Home / Self Care | Payer: Medicare Other | Source: Ambulatory Visit | Attending: General Surgery | Admitting: General Surgery

## 2012-10-21 DIAGNOSIS — I739 Peripheral vascular disease, unspecified: Secondary | ICD-10-CM

## 2012-10-21 MED ORDER — ALBUTEROL SULFATE HFA 108 (90 BASE) MCG/ACT IN AERS: 2.0000 | INHALATION_SPRAY | Freq: Four times a day (QID) | RESPIRATORY_TRACT | Status: DC | PRN

## 2012-10-21 NOTE — Progress Notes (Signed)
Lower Extremity Arterial Duplex Completed. °[name] ° °

## 2012-10-21 NOTE — Telephone Encounter (Signed)
Spoke with patient requesting refill on proair. Rx sent nothing further needed at this time

## 2012-10-21 NOTE — Progress Notes (Signed)
Lower Extremity Venous Duplex Completed. °[name] ° °

## 2012-10-25 ENCOUNTER — Ambulatory Visit (INDEPENDENT_AMBULATORY_CARE_PROVIDER_SITE_OTHER): Payer: Medicare Other | Admitting: Pulmonary Disease

## 2012-10-25 ENCOUNTER — Encounter: Payer: Self-pay | Admitting: Pulmonary Disease

## 2012-10-25 VITALS — BP 130/80 | HR 65 | Ht 71.0 in | Wt 183.0 lb

## 2012-10-25 DIAGNOSIS — J449 Chronic obstructive pulmonary disease, unspecified: Secondary | ICD-10-CM

## 2012-10-25 MED ORDER — ALBUTEROL SULFATE HFA 108 (90 BASE) MCG/ACT IN AERS: 2.0000 | INHALATION_SPRAY | Freq: Four times a day (QID) | RESPIRATORY_TRACT | Status: DC | PRN

## 2012-10-25 MED ORDER — ALBUTEROL SULFATE (2.5 MG/3ML) 0.083% IN NEBU: 2.5000 mg | INHALATION_SOLUTION | Freq: Two times a day (BID) | RESPIRATORY_TRACT | Status: DC

## 2012-10-25 NOTE — Assessment & Plan Note (Signed)
Pro air x x 3 refills Albuterol nebs twice daily x 30 ds x 2 refills - [facility] symbicort/ spiriva You have to stop smoking completely Not the best candidate for pulm rehab but could consider once leg ulcer is healed

## 2012-10-25 NOTE — Patient Instructions (Addendum)
Pro air x x 3 refills Albuterol nebs twice daily x 30 ds x 2 refills - [facility] You have to stop smoking completely

## 2012-10-25 NOTE — Progress Notes (Signed)
  Subjective:    Patient ID: [name], female    DOB: [birth year], 79 y.o.   MRN: [id number]  HPI  79/F , smoker presents for FU of COPD.  She has been on home O2 since dec 77 2010 ([facility]) after a chest cold requiring prednisone. She smoked 1/2 PPD, about 21 Pyrs - chantix made her sick & she is afraid of cardiac side effects with patches. She sees Dr Myrtis Ser for CAD & has tolerated lisinopril & metoprolol, echo 7/10 showed nml LV fn & non dilated RV. An episode of syncope in 2009 was attributed to NTG & diuretics.  She reports 79 phlegm, nasal drip & sneezing - no seasonal allergies, nasal atrovent dries her out.  She has lt >> Rt pedal edema  PFTs 08/19/09 >> severe airway obstruction, FEV1 47%, no BD response, air trapping +, severe decrease in diffusion  CT angio 5/12 asc aortic aneurysm 41mm, hepatic cysts   Hosp 3/31- 08/04/11 for Newly recognized paroxysmal atrial fibrillation with RVR and COPD exacerbation  - not felt to be a coumadin candidate secondary to hx of GIB/AVMs   Admitted October 12 for COPD, exacerbation  Re- admitted October 17 -October 20/2013 for slow to improve COPD, exacerbation, and possible decompensated chronic diastolic congestive heart failure in the setting of poor oral intake  Discharged to [employer]. ACE inhibitor was changed  to losartan     10/25/2012 4 month follow up  complains of SOB  ON last visit 06/23/2012  >> doxy / then levaquin, pred taper  Hosp adm 07/2012 for stent, 09/2012 for LLE cellulitis & bleeding -- follows with wound center ABI (demonstarted moderate Left lower extremity PAD)  LE dopplers (no DVT) - vascular appt pending Now in wheelchair, does not drive Still smoking from time to time.  Dyspnea is at baseline, no pedal edema Using pro-air 'a whole lot '   Review of Systems neg for any significant sore throat, dysphagia, itching, sneezing, nasal congestion or excess/ purulent secretions, fever, chills, sweats, unintended wt  loss, pleuritic or exertional cp, hempoptysis, orthopnea pnd or change in chronic leg swelling. Also denies presyncope, palpitations, heartburn, abdominal pain, nausea, vomiting, diarrhea or change in bowel or urinary habits, dysuria,hematuria, rash, arthralgias, visual complaints, headache, numbness weakness or ataxia.     Objective:   Physical Exam  Gen. Pleasant, well-nourished, in no distress ENT - no lesions, no post nasal drip Neck: No JVD, no thyromegaly, no carotid bruits Lungs: no use of accessory muscles, no dullness to percussion, decreased without rales or rhonchi  Cardiovascular: Rhythm regular, heart sounds  normal, no murmurs or gallops, no peripheral edema Musculoskeletal: No deformities, no cyanosis or clubbing        Assessment & Plan:

## 2012-10-27 ENCOUNTER — Encounter (HOSPITAL_BASED_OUTPATIENT_CLINIC_OR_DEPARTMENT_OTHER): Payer: Medicare Other | Attending: General Surgery

## 2012-10-27 ENCOUNTER — Other Ambulatory Visit: Payer: Self-pay | Admitting: Cardiology

## 2012-10-27 DIAGNOSIS — L97209 Non-pressure chronic ulcer of unspecified calf with unspecified severity: Secondary | ICD-10-CM | POA: Insufficient documentation

## 2012-10-27 DIAGNOSIS — I872 Venous insufficiency (chronic) (peripheral): Secondary | ICD-10-CM | POA: Insufficient documentation

## 2012-10-27 DIAGNOSIS — I87309 Chronic venous hypertension (idiopathic) without complications of unspecified lower extremity: Secondary | ICD-10-CM | POA: Insufficient documentation

## 2012-11-10 ENCOUNTER — Encounter: Payer: Self-pay | Admitting: Cardiovascular Disease

## 2012-11-10 ENCOUNTER — Ambulatory Visit (INDEPENDENT_AMBULATORY_CARE_PROVIDER_SITE_OTHER): Payer: Medicare Other | Admitting: Cardiovascular Disease

## 2012-11-10 VITALS — BP 148/58 | HR 56 | Ht 71.5 in | Wt 185.0 lb

## 2012-11-10 DIAGNOSIS — L97909 Non-pressure chronic ulcer of unspecified part of unspecified lower leg with unspecified severity: Secondary | ICD-10-CM

## 2012-11-10 DIAGNOSIS — L97921 Non-pressure chronic ulcer of unspecified part of left lower leg limited to breakdown of skin: Secondary | ICD-10-CM

## 2012-11-10 NOTE — Assessment & Plan Note (Signed)
She is had trauma to her left shin in April and developed an anterior ulcer. She is followed by Dr. Ardath Sax at the wound care center. Apparently the ulcer is slowly healing. Emotionally Dopplers revealed a left ABI of 0.65 with moderate disease in her left SFA and one vessel runoff via the posterior tibial. I do not think that her ulcer is ischemic raised basically traumatic and it is healing. I would have a high threshold to perform any kind of invasive procedure. He'll see her back in 3 months

## 2012-11-10 NOTE — Patient Instructions (Addendum)
Your physician wants you to follow-up in: 3 months. You will receive a reminder letter in the mail two months in advance. If you don't receive a letter, please call our office to schedule the follow-up appointment.   

## 2012-11-10 NOTE — Progress Notes (Signed)
11/10/2012 Otilio [facility]er Show   23-Apr-1930  [id number]  Primary Physician Kaleen Mask, MD Primary Cardiologist: Runell Gess MD Roseanne Reno   HPI:  Ms. Penning is an 79 year old widowed Caucasian female mother of one child accompanied by one of her sisters today. She is was referred by Dr. Ardath Sax at the wound care center for evaluation of a left anterior shin ulcer. This occurred after she injured her shin on 07/2512.  Her cardiac risk factors are propels positive for 50-60-pack-year history tobacco abuse having recently quit. She has treated hypertension and hyperlipidemia. She does have known heart disease status post coronary artery bypass grafting in the past as well as stenting. She has had a heart attack but has never had a stroke. She has severe limiting COPD and is oxygen dependent. She had loggia mid-upper performed in our office/27/14 revealed ABIs of 8.6 range bilaterally a high-grade mid right SFA stenosis, moderate left SFAs stenosis with one-vessel runoff. She does say that the wound is slowly healing. She really does not ambulate enough to notice whether or not she has claudication.   Current Outpatient Prescriptions  Medication Sig Dispense Refill  . albuterol (PROVENTIL HFA;VENTOLIN HFA) 108 (90 BASE) MCG/ACT inhaler Inhale 2 puffs into the lungs every 6 (six) hours as needed for wheezing.      Marland Kitchen aspirin EC 81 MG tablet Take 81 mg by mouth daily.      Marland Kitchen atorvastatin (LIPITOR) 40 MG tablet TAKE 1 TABLET AT BEDTIME  90 tablet  2  . bisoprolol (ZEBETA) 10 MG tablet Take 10 mg by mouth daily.      . budesonide-formoterol (SYMBICORT) 160-4.5 MCG/ACT inhaler Inhale 2 puffs into the lungs 2 (two) times daily.      Marland Kitchen diltiazem (CARDIZEM CD) 240 MG 24 hr capsule Take 240 mg by mouth daily.      . furosemide (LASIX) 40 MG tablet Take 1 tablet (40 mg total) by mouth daily.  90 tablet  1  . hydroxypropyl methylcellulose (ISOPTO TEARS) 2.5 % ophthalmic solution  Place 1 drop into both eyes daily as needed (for dry eyes).      . IPRATROPIUM BROMIDE NA Place 2 sprays into the nose daily as needed (for wheezing).      Marland Kitchen losartan (COZAAR) 50 MG tablet Take 50 mg by mouth daily.      . nitroGLYCERIN (NITROSTAT) 0.4 MG SL tablet Place 0.4 mg under the tongue every 5 (five) minutes as needed for chest pain.      . pantoprazole (PROTONIX) 40 MG tablet Take 1 tablet (40 mg total) by mouth daily.  90 tablet  0  . potassium chloride SA (K-DUR,KLOR-CON) 20 MEQ tablet Take 20 mEq by mouth 2 (two) times daily.      Marland Kitchen tiotropium (SPIRIVA) 18 MCG inhalation capsule Place 18 mcg into inhaler and inhale daily.       . Vitamin D, Ergocalciferol, (DRISDOL) 50000 UNITS CAPS Take 50,000 Units by mouth every 7 (seven) days. Every other Sunday       No current facility-administered medications for this visit.    Allergies  Allergen Reactions  . Codeine Other (See Comments)    unknown  . Ranitidine Hcl Other (See Comments)    unknown  . Sulfonamide Derivatives Nausea And Vomiting  . Penicillins Rash and Other (See Comments)    unknown    History   Social History  . Marital Status: Widowed    Spouse Name: N/A    Number  of Children: 1  . Years of Education: N/A   Occupational History  . retired     [employer]   Social History Main Topics  . Smoking status: Former Smoker -- 0.50 packs/day for 59 years    Types: Cigarettes    Quit date: 02/04/2012  . Smokeless tobacco: Not on file  . Alcohol Use: No  . Drug Use: Not on file  . Sexually Active: Not on file   Other Topics Concern  . Not on file   Social History Narrative  . No narrative on file     Review of Systems: General: negative for chills, fever, night sweats or weight changes.  Cardiovascular: negative for chest pain, dyspnea on exertion, edema, orthopnea, palpitations, paroxysmal nocturnal dyspnea or shortness of breath Dermatological: negative for rash Respiratory: negative for cough or  wheezing Urologic: negative for hematuria Abdominal: negative for nausea, vomiting, diarrhea, bright red blood per rectum, melena, or hematemesis Neurologic: negative for visual changes, syncope, or dizziness All other systems reviewed and are otherwise negative except as noted above.    Blood pressure 148/58, pulse 56, height 5' 11.5" (1.816 m), weight 185 lb (83.915 kg).  General appearance: alert and no distress Neck: no adenopathy, no carotid bruit, no JVD, supple, symmetrical, trachea midline and thyroid not enlarged, symmetric, no tenderness/mass/nodules Lungs: diminished breath sounds bilaterally Heart: regular rate and rhythm, S1, S2 normal, no murmur, click, rub or gallop Extremities: venous stasis dermatitis noted Pulses: 2+ and symmetric left lower extremity  EKG not performed today  ASSESSMENT AND PLAN:   No problem-specific assessment & plan notes found for this encounter.      Runell Gess MD FACP,FACC,FAHA, [facility] 11/10/2012 4:09 PM

## 2012-12-03 ENCOUNTER — Telehealth: Payer: Self-pay | Admitting: Pulmonary Disease

## 2012-12-03 NOTE — Telephone Encounter (Signed)
Pro-air is covered for her - NOT proventil. Pl [facility] x 3 m x 3 refills

## 2012-12-05 ENCOUNTER — Other Ambulatory Visit: Payer: Self-pay | Admitting: *Deleted

## 2012-12-05 MED ORDER — ALBUTEROL SULFATE HFA 108 (90 BASE) MCG/ACT IN AERS: 2.0000 | INHALATION_SPRAY | Freq: Four times a day (QID) | RESPIRATORY_TRACT | Status: DC | PRN

## 2012-12-06 ENCOUNTER — Other Ambulatory Visit: Payer: Self-pay | Admitting: Cardiology

## 2012-12-17 ENCOUNTER — Other Ambulatory Visit: Payer: Self-pay | Admitting: Cardiology

## 2012-12-29 ENCOUNTER — Encounter: Payer: Self-pay | Admitting: Cardiology

## 2012-12-29 ENCOUNTER — Ambulatory Visit (INDEPENDENT_AMBULATORY_CARE_PROVIDER_SITE_OTHER): Payer: Medicare Other | Admitting: Cardiology

## 2012-12-29 VITALS — BP 140/62 | HR 68 | Ht 71.1 in | Wt 175.0 lb

## 2012-12-29 DIAGNOSIS — I2510 Atherosclerotic heart disease of native coronary artery without angina pectoris: Secondary | ICD-10-CM

## 2012-12-29 DIAGNOSIS — I509 Heart failure, unspecified: Secondary | ICD-10-CM

## 2012-12-29 DIAGNOSIS — I5032 Chronic diastolic (congestive) heart failure: Secondary | ICD-10-CM

## 2012-12-29 NOTE — Progress Notes (Signed)
HPI  Patient is seen to followup coronary disease. She has severe lung disease. She did receive a bare-metal stent earlier in 2014. Her Plavix has been stopped. She does have shortness of breath. She does not appear to be volume overloaded. Her ejection fraction was normal at the time of her cath in 2014. There was a lesion on her left leg that took a long time to heal. Ultimately it has healed. She had a peripheral vascular evaluation , and no further treatment was recommended.  Allergies  Allergen Reactions  . Codeine Other (See Comments)    unknown  . Ranitidine Hcl Other (See Comments)    unknown  . Sulfonamide Derivatives Nausea And Vomiting  . Penicillins Rash and Other (See Comments)    unknown    Current Outpatient Prescriptions  Medication Sig Dispense Refill  . albuterol (PROAIR HFA) 108 (90 BASE) MCG/ACT inhaler Inhale 2 puffs into the lungs every 6 (six) hours as needed for wheezing.  3 Inhaler  3  . aspirin EC 81 MG tablet Take 81 mg by mouth daily.      Marland Kitchen atorvastatin (LIPITOR) 40 MG tablet TAKE 1 TABLET AT BEDTIME  90 tablet  2  . bisoprolol (ZEBETA) 10 MG tablet TAKE 1 TABLET DAILY  90 tablet  0  . budesonide-formoterol (SYMBICORT) 160-4.5 MCG/ACT inhaler Inhale 2 puffs into the lungs 2 (two) times daily.      Marland Kitchen diltiazem (CARDIZEM CD) 240 MG 24 hr capsule Take 240 mg by mouth daily.      . furosemide (LASIX) 40 MG tablet Take 1 tablet (40 mg total) by mouth daily.  90 tablet  1  . hydroxypropyl methylcellulose (ISOPTO TEARS) 2.5 % ophthalmic solution Place 1 drop into both eyes daily as needed (for dry eyes).      . IPRATROPIUM BROMIDE NA Place 2 sprays into the nose daily as needed (for wheezing).      Marland Kitchen losartan (COZAAR) 50 MG tablet Take 50 mg by mouth daily.      . nitroGLYCERIN (NITROSTAT) 0.4 MG SL tablet Place 0.4 mg under the tongue every 5 (five) minutes as needed for chest pain.      . pantoprazole (PROTONIX) 40 MG tablet TAKE 1 TABLET DAILY  90 tablet  0  .  potassium chloride SA (K-DUR,KLOR-CON) 20 MEQ tablet Take 20 mEq by mouth 2 (two) times daily.      Marland Kitchen tiotropium (SPIRIVA) 18 MCG inhalation capsule Place 18 mcg into inhaler and inhale daily.       . Vitamin D, Ergocalciferol, (DRISDOL) 50000 UNITS CAPS Take 50,000 Units by mouth every 7 (seven) days. Every other Sunday       No current facility-administered medications for this visit.    History   Social History  . Marital Status: Widowed    Spouse Name: N/A    Number of Children: 1  . Years of Education: N/A   Occupational History  . retired     [employer]   Social History Main Topics  . Smoking status: Former Smoker -- 0.50 packs/day for 59 years    Types: Cigarettes    Quit date: 02/04/2012  . Smokeless tobacco: Not on file  . Alcohol Use: No  . Drug Use: Not on file  . Sexual Activity: Not on file   Other Topics Concern  . Not on file   Social History Narrative  . No narrative on file    Family History  Problem Relation Age of Onset  .  Stroke Sister   . Heart failure Mother   . Cancer Sister     Breast and Lung   . Cancer Brother     breast cancer    Past Medical History  Diagnosis Date  . CAD (coronary artery disease)     a. s/p CABG x5 (2005) b. s/p BMS-prox SVG-D2 (2014)  . Hypertension   . Hyperlipidemia   . GI bleed     AVMs  . Aneurysm, aortic     thoracic aorta, stable at 4.1 cm, chest CT, May, 2012  . Syncope     Nitroglycerin plus a diuretic, April, 2009  . Hyponatremia     Chronic. Felt secondary to SIADH   . COPD (chronic obstructive pulmonary disease)   . Tobacco abuse   . Bradycardia   . Carotid artery disease     Hx of endarterectomy. Doppler October, 2011, stable, 0-39% RIC A., 40-59% LICA  . Tuberculosis     Exposures to tuberculosis 1970s, has tested negative by the health Department  . Atrial fibrillation     Paroxysmal with RVR 07/2011, not felt to be a coumadin candidate secondary to hx of GIB/AVM  . Venous stasis of lower  extremity     Chronic  . Ejection fraction     a. EF 55-60%, echo, April, 2013 b. EF 55-60%, mild biatrial enlargement and PASP 42 mmH  . CHF with left ventricular diastolic dysfunction, NYHA class 2   . AAA (abdominal aortic aneurysm)   . On home O2     2L N/C  . Complication of anesthesia   . Shortness of breath   . Hx of CABG     CABG 2005  . Hematoma     Right groin hematoma, small, post cath, April, 2014  . Mitral regurgitation     Mild, hospital, March, 2014  . Leg ulcer     Ulcerated lesion on the anterior aspect of her left lower leg, June, 2014    Past Surgical History  Procedure Laterality Date  . Cholecystectomy    . Carotid endartercetomy    . Abdominal hysterectomy    . Coronary artery bypass graft  2005  . Coronary angioplasty with stent placement  07/18/12    severe native coronary artery disease, 99% proximal stenosis of the SVG-D2 status post successful PTCA/PCI with a Veriflex bare-metal stent, widely patent SVGs to both the right PDA sequential OM1 to OM 2, EF 65-70% and 3+ mitral regurgitation    Patient Active Problem List   Diagnosis Date Noted  . Cellulitis, leg 09/28/2012  . Leg ulcer   . CAD (coronary artery disease)   . Hematoma   . Mitral regurgitation   . Chronic diastolic CHF (congestive heart failure) 07/20/2012  . Anemia 07/20/2012  . Intermediate coronary syndrome 07/18/2012  . Acute exacerbation of chronic obstructive pulmonary disease (COPD) 02/15/2012  . Dehydration 02/12/2012  . Alkalosis, metabolic 02/12/2012  . Generalized weakness 02/11/2012  . Atrial fibrillation   . Chronic respiratory failure 07/29/2011  . H/O steroid therapy 07/28/2011  . Edema   . Aortic valve sclerosis   . Hypertension   . Hyperlipidemia   . GI bleed   . Aneurysm, aortic   . Syncope   . Hyponatremia   . COPD (chronic obstructive pulmonary disease)   . Tobacco abuse   . Bradycardia   . Hx of CABG   . Ejection fraction   . Carotid artery disease     . Tuberculosis   .  OXYGEN-USE OF SUPPLEMENTAL 08/02/2009    ROS   Patient denies fever, chills, headache, sweats, rash, change in vision, change in hearing, chest pain, nausea vomiting, urinary symptoms. She does have coughing.  PHYSICAL EXAM  Patient is in a wheelchair. She has kyphosis of the spine. She is oriented to person time and place. Affect is normal. There is no jugulovenous distention. Lungs reveal decreased breath sounds. She is short of breath with much movement. Abdomen is soft. There is slight redness in the left lower extremity but it is mild. There is no marked edema.  Filed Vitals:   12/29/12 1447  BP: 140/62  Pulse: 68  Height: 5' 11.1" (1.806 m)  Weight: 175 lb (79.379 kg)  SpO2: 95%    EKG  ASSESSMENT & PLAN

## 2012-12-29 NOTE — Assessment & Plan Note (Signed)
This is now healed. No further workup.

## 2012-12-29 NOTE — Assessment & Plan Note (Signed)
She has chronic diastolic CHF. I believe that this is stable. I feel we should not push her diuretics any further at this time.

## 2012-12-29 NOTE — Patient Instructions (Addendum)
**Note De-identified  Obfuscation** Your physician recommends that you continue on your current medications as directed. Please refer to the Current Medication list given to you today.  Your physician wants you to follow-up in: 6 months. You will receive a reminder letter in the mail two months in advance. If you don't receive a letter, please call our office to schedule the follow-up appointment.  

## 2012-12-29 NOTE — Assessment & Plan Note (Signed)
Her coronary disease is stable at this time. No further workup.

## 2013-02-08 ENCOUNTER — Emergency Department (HOSPITAL_COMMUNITY): Payer: Medicare Other

## 2013-02-08 ENCOUNTER — Inpatient Hospital Stay (HOSPITAL_COMMUNITY)
Admission: EM | Admit: 2013-02-08 | Discharge: 2013-02-13 | DRG: 190 | Disposition: A | Discharge status: Skilled Nursing Facility | Payer: Medicare Other | Attending: Internal Medicine | Admitting: Internal Medicine

## 2013-02-08 ENCOUNTER — Encounter (HOSPITAL_COMMUNITY): Payer: Self-pay | Admitting: Emergency Medicine

## 2013-02-08 DIAGNOSIS — L03119 Cellulitis of unspecified part of limb: Secondary | ICD-10-CM

## 2013-02-08 DIAGNOSIS — E873 Alkalosis: Secondary | ICD-10-CM

## 2013-02-08 DIAGNOSIS — E860 Dehydration: Secondary | ICD-10-CM

## 2013-02-08 DIAGNOSIS — Z880 Allergy status to penicillin: Secondary | ICD-10-CM

## 2013-02-08 DIAGNOSIS — R609 Edema, unspecified: Secondary | ICD-10-CM

## 2013-02-08 DIAGNOSIS — R531 Weakness: Secondary | ICD-10-CM

## 2013-02-08 DIAGNOSIS — I358 Other nonrheumatic aortic valve disorders: Secondary | ICD-10-CM

## 2013-02-08 DIAGNOSIS — I509 Heart failure, unspecified: Secondary | ICD-10-CM | POA: Diagnosis present

## 2013-02-08 DIAGNOSIS — E871 Hypo-osmolality and hyponatremia: Secondary | ICD-10-CM

## 2013-02-08 DIAGNOSIS — I340 Nonrheumatic mitral (valve) insufficiency: Secondary | ICD-10-CM

## 2013-02-08 DIAGNOSIS — I059 Rheumatic mitral valve disease, unspecified: Secondary | ICD-10-CM | POA: Diagnosis present

## 2013-02-08 DIAGNOSIS — R001 Bradycardia, unspecified: Secondary | ICD-10-CM

## 2013-02-08 DIAGNOSIS — Z92241 Personal history of systemic steroid therapy: Secondary | ICD-10-CM

## 2013-02-08 DIAGNOSIS — R55 Syncope and collapse: Secondary | ICD-10-CM

## 2013-02-08 DIAGNOSIS — I5032 Chronic diastolic (congestive) heart failure: Secondary | ICD-10-CM

## 2013-02-08 DIAGNOSIS — R9430 Abnormal result of cardiovascular function study, unspecified: Secondary | ICD-10-CM

## 2013-02-08 DIAGNOSIS — Z79899 Other long term (current) drug therapy: Secondary | ICD-10-CM

## 2013-02-08 DIAGNOSIS — I200 Unstable angina: Secondary | ICD-10-CM

## 2013-02-08 DIAGNOSIS — I714 Abdominal aortic aneurysm, without rupture, unspecified: Secondary | ICD-10-CM | POA: Diagnosis present

## 2013-02-08 DIAGNOSIS — J9610 Chronic respiratory failure, unspecified whether with hypoxia or hypercapnia: Secondary | ICD-10-CM

## 2013-02-08 DIAGNOSIS — J449 Chronic obstructive pulmonary disease, unspecified: Secondary | ICD-10-CM

## 2013-02-08 DIAGNOSIS — I719 Aortic aneurysm of unspecified site, without rupture: Secondary | ICD-10-CM

## 2013-02-08 DIAGNOSIS — T148XXA Other injury of unspecified body region, initial encounter: Secondary | ICD-10-CM

## 2013-02-08 DIAGNOSIS — J441 Chronic obstructive pulmonary disease with (acute) exacerbation: Principal | ICD-10-CM

## 2013-02-08 DIAGNOSIS — D649 Anemia, unspecified: Secondary | ICD-10-CM

## 2013-02-08 DIAGNOSIS — I2510 Atherosclerotic heart disease of native coronary artery without angina pectoris: Secondary | ICD-10-CM

## 2013-02-08 DIAGNOSIS — Z720 Tobacco use: Secondary | ICD-10-CM

## 2013-02-08 DIAGNOSIS — L97929 Non-pressure chronic ulcer of unspecified part of left lower leg with unspecified severity: Secondary | ICD-10-CM

## 2013-02-08 DIAGNOSIS — F17200 Nicotine dependence, unspecified, uncomplicated: Secondary | ICD-10-CM | POA: Diagnosis present

## 2013-02-08 DIAGNOSIS — E785 Hyperlipidemia, unspecified: Secondary | ICD-10-CM

## 2013-02-08 DIAGNOSIS — I779 Disorder of arteries and arterioles, unspecified: Secondary | ICD-10-CM

## 2013-02-08 DIAGNOSIS — Z951 Presence of aortocoronary bypass graft: Secondary | ICD-10-CM

## 2013-02-08 DIAGNOSIS — Z9981 Dependence on supplemental oxygen: Secondary | ICD-10-CM

## 2013-02-08 DIAGNOSIS — I4891 Unspecified atrial fibrillation: Secondary | ICD-10-CM

## 2013-02-08 DIAGNOSIS — J9600 Acute respiratory failure, unspecified whether with hypoxia or hypercapnia: Secondary | ICD-10-CM

## 2013-02-08 DIAGNOSIS — R0789 Other chest pain: Secondary | ICD-10-CM | POA: Diagnosis present

## 2013-02-08 DIAGNOSIS — I10 Essential (primary) hypertension: Secondary | ICD-10-CM

## 2013-02-08 DIAGNOSIS — A159 Respiratory tuberculosis unspecified: Secondary | ICD-10-CM

## 2013-02-08 DIAGNOSIS — J9620 Acute and chronic respiratory failure, unspecified whether with hypoxia or hypercapnia: Secondary | ICD-10-CM | POA: Diagnosis present

## 2013-02-08 DIAGNOSIS — K922 Gastrointestinal hemorrhage, unspecified: Secondary | ICD-10-CM

## 2013-02-08 DIAGNOSIS — IMO0002 Reserved for concepts with insufficient information to code with codable children: Secondary | ICD-10-CM

## 2013-02-08 LAB — CBC WITH DIFFERENTIAL/PLATELET
Basophils Absolute: 0 10*3/uL (ref 0.0–0.1)
Basophils Relative: 0 % (ref 0–1)
Eosinophils Absolute: 0 10*3/uL (ref 0.0–0.7)
Eosinophils Relative: 0 % (ref 0–5)
HCT: 36.7 % (ref 36.0–46.0)
MCH: 31.1 pg (ref 26.0–34.0)
MCHC: 33.8 g/dL (ref 30.0–36.0)
MCV: 92 fL (ref 78.0–100.0)
Monocytes Absolute: 0.7 10*3/uL (ref 0.1–1.0)
RDW: 13.4 % (ref 11.5–15.5)

## 2013-02-08 LAB — TROPONIN I: Troponin I: <0.3 ng/mL (ref <0.30)

## 2013-02-08 LAB — BASIC METABOLIC PANEL
CO2: 31 mEq/L (ref 19–32)
Calcium: 9.9 mg/dL (ref 8.4–10.5)
Creatinine, Ser: 0.97 mg/dL (ref 0.50–1.10)
GFR calc Af Amer: 62 mL/min — ABNORMAL LOW (ref >90)
GFR calc non Af Amer: 53 mL/min — ABNORMAL LOW (ref >90)

## 2013-02-08 MED ORDER — FUROSEMIDE 40 MG PO TABS
40.0000 mg | ORAL_TABLET | Freq: Every day | ORAL | Status: DC
  Administered 2013-02-08 – 2013-02-13 (×6): 40 mg via ORAL
  Filled 2013-02-08 (×6): qty 1

## 2013-02-08 MED ORDER — ACETAMINOPHEN 325 MG PO TABS
650.0000 mg | ORAL_TABLET | Freq: Four times a day (QID) | ORAL | Status: DC | PRN
  Administered 2013-02-09 (×2): 650 mg via ORAL
  Filled 2013-02-08 (×2): qty 2

## 2013-02-08 MED ORDER — POTASSIUM CHLORIDE CRYS ER 20 MEQ PO TBCR
20.0000 meq | EXTENDED_RELEASE_TABLET | Freq: Two times a day (BID) | ORAL | Status: DC
  Administered 2013-02-08 – 2013-02-13 (×11): 20 meq via ORAL
  Filled 2013-02-08 (×13): qty 1

## 2013-02-08 MED ORDER — IPRATROPIUM BROMIDE 0.02 % IN SOLN
0.5000 mg | Freq: Once | RESPIRATORY_TRACT | Status: AC
  Administered 2013-02-08: 0.5 mg via RESPIRATORY_TRACT

## 2013-02-08 MED ORDER — ACETAMINOPHEN 500 MG PO TABS
1000.0000 mg | ORAL_TABLET | Freq: Once | ORAL | Status: AC
  Administered 2013-02-08: 1000 mg via ORAL
  Filled 2013-02-08: qty 2

## 2013-02-08 MED ORDER — LEVOFLOXACIN IN D5W 500 MG/100ML IV SOLN
500.0000 mg | Freq: Once | INTRAVENOUS | Status: AC
  Administered 2013-02-08: 500 mg via INTRAVENOUS
  Filled 2013-02-08: qty 100

## 2013-02-08 MED ORDER — NITROGLYCERIN 0.4 MG SL SUBL: 0.4000 mg | SUBLINGUAL_TABLET | SUBLINGUAL | Status: DC | PRN

## 2013-02-08 MED ORDER — POLYVINYL ALCOHOL 1.4 % OP SOLN
1.0000 [drp] | OPHTHALMIC | Status: DC | PRN
  Filled 2013-02-08: qty 15

## 2013-02-08 MED ORDER — GUAIFENESIN-DM 100-10 MG/5ML PO SYRP: 5.0000 mL | ORAL_SOLUTION | ORAL | Status: DC | PRN

## 2013-02-08 MED ORDER — LOSARTAN POTASSIUM 50 MG PO TABS
50.0000 mg | ORAL_TABLET | Freq: Every day | ORAL | Status: DC
  Administered 2013-02-08 – 2013-02-13 (×6): 50 mg via ORAL
  Filled 2013-02-08 (×6): qty 1

## 2013-02-08 MED ORDER — ALBUTEROL SULFATE (5 MG/ML) 0.5% IN NEBU
5.0000 mg | INHALATION_SOLUTION | Freq: Once | RESPIRATORY_TRACT | Status: DC
  Filled 2013-02-08: qty 1

## 2013-02-08 MED ORDER — METHYLPREDNISOLONE SODIUM SUCC 125 MG IJ SOLR
125.0000 mg | Freq: Once | INTRAMUSCULAR | Status: DC
  Administered 2013-02-08: 125 mg via INTRAVENOUS

## 2013-02-08 MED ORDER — GUAIFENESIN ER 600 MG PO TB12
600.0000 mg | ORAL_TABLET | Freq: Two times a day (BID) | ORAL | Status: DC
  Administered 2013-02-08 – 2013-02-13 (×10): 600 mg via ORAL
  Filled 2013-02-08 (×11): qty 1

## 2013-02-08 MED ORDER — IPRATROPIUM BROMIDE 0.02 % IN SOLN
0.5000 mg | Freq: Once | RESPIRATORY_TRACT | Status: DC
  Filled 2013-02-08 (×2): qty 2.5

## 2013-02-08 MED ORDER — ALBUTEROL SULFATE (5 MG/ML) 0.5% IN NEBU
2.5000 mg | INHALATION_SOLUTION | RESPIRATORY_TRACT | Status: DC | PRN
  Administered 2013-02-08 – 2013-02-09 (×2): 2.5 mg via RESPIRATORY_TRACT
  Filled 2013-02-08 (×3): qty 0.5

## 2013-02-08 MED ORDER — IPRATROPIUM BROMIDE 0.02 % IN SOLN
0.5000 mg | Freq: Four times a day (QID) | RESPIRATORY_TRACT | Status: DC
  Filled 2013-02-08 (×2): qty 2.5

## 2013-02-08 MED ORDER — METHYLPREDNISOLONE SODIUM SUCC 125 MG IJ SOLR
60.0000 mg | Freq: Two times a day (BID) | INTRAMUSCULAR | Status: DC
  Administered 2013-02-08 – 2013-02-10 (×4): 60 mg via INTRAVENOUS
  Filled 2013-02-08 (×6): qty 0.96

## 2013-02-08 MED ORDER — ONDANSETRON HCL 4 MG PO TABS: 4.0000 mg | ORAL_TABLET | Freq: Four times a day (QID) | ORAL | Status: DC | PRN

## 2013-02-08 MED ORDER — ONDANSETRON HCL 4 MG/2ML IJ SOLN: 4.0000 mg | Freq: Four times a day (QID) | INTRAMUSCULAR | Status: DC | PRN

## 2013-02-08 MED ORDER — ACETAMINOPHEN 650 MG RE SUPP: 650.0000 mg | Freq: Four times a day (QID) | RECTAL | Status: DC | PRN

## 2013-02-08 MED ORDER — IPRATROPIUM BROMIDE 0.02 % IN SOLN
0.5000 mg | Freq: Four times a day (QID) | RESPIRATORY_TRACT | Status: DC
  Administered 2013-02-08: 15:00:00 0.5 mg via RESPIRATORY_TRACT

## 2013-02-08 MED ORDER — SENNOSIDES-DOCUSATE SODIUM 8.6-50 MG PO TABS
1.0000 | ORAL_TABLET | Freq: Every day | ORAL | Status: DC
  Administered 2013-02-08 – 2013-02-13 (×6): 1 via ORAL
  Filled 2013-02-08 (×6): qty 1

## 2013-02-08 MED ORDER — ATORVASTATIN CALCIUM 40 MG PO TABS
40.0000 mg | ORAL_TABLET | Freq: Every day | ORAL | Status: DC
  Administered 2013-02-08 – 2013-02-12 (×5): 40 mg via ORAL
  Filled 2013-02-08 (×7): qty 1

## 2013-02-08 MED ORDER — PANTOPRAZOLE SODIUM 40 MG PO TBEC
40.0000 mg | DELAYED_RELEASE_TABLET | Freq: Every day | ORAL | Status: DC
  Administered 2013-02-08 – 2013-02-13 (×6): 40 mg via ORAL
  Filled 2013-02-08 (×6): qty 1

## 2013-02-08 MED ORDER — ALBUTEROL SULFATE (5 MG/ML) 0.5% IN NEBU
5.0000 mg | INHALATION_SOLUTION | Freq: Once | RESPIRATORY_TRACT | Status: AC
  Administered 2013-02-08: 5 mg via RESPIRATORY_TRACT

## 2013-02-08 MED ORDER — LEVOFLOXACIN IN D5W 500 MG/100ML IV SOLN
500.0000 mg | INTRAVENOUS | Status: DC
  Administered 2013-02-09: 500 mg via INTRAVENOUS
  Filled 2013-02-08 (×2): qty 100

## 2013-02-08 MED ORDER — HYPROMELLOSE (GONIOSCOPIC) 2.5 % OP SOLN: 1.0000 [drp] | Freq: Every day | OPHTHALMIC | Status: DC | PRN

## 2013-02-08 MED ORDER — ASPIRIN EC 81 MG PO TBEC
81.0000 mg | DELAYED_RELEASE_TABLET | Freq: Every day | ORAL | Status: DC
  Administered 2013-02-09 – 2013-02-13 (×5): 81 mg via ORAL
  Filled 2013-02-08 (×6): qty 1

## 2013-02-08 MED ORDER — BISOPROLOL FUMARATE 10 MG PO TABS
10.0000 mg | ORAL_TABLET | Freq: Every day | ORAL | Status: DC
  Administered 2013-02-08 – 2013-02-13 (×6): 10 mg via ORAL
  Filled 2013-02-08 (×6): qty 1

## 2013-02-08 MED ORDER — DILTIAZEM HCL ER COATED BEADS 240 MG PO CP24
240.0000 mg | ORAL_CAPSULE | Freq: Every day | ORAL | Status: DC
  Administered 2013-02-08 – 2013-02-13 (×6): 240 mg via ORAL
  Filled 2013-02-08 (×6): qty 1

## 2013-02-08 MED ORDER — ALUM & MAG HYDROXIDE-SIMETH 200-200-20 MG/5ML PO SUSP: 30.0000 mL | Freq: Four times a day (QID) | ORAL | Status: DC | PRN

## 2013-02-08 MED ORDER — ALBUTEROL SULFATE (5 MG/ML) 0.5% IN NEBU
2.5000 mg | INHALATION_SOLUTION | Freq: Four times a day (QID) | RESPIRATORY_TRACT | Status: DC
  Administered 2013-02-08: 15:00:00 2.5 mg via RESPIRATORY_TRACT

## 2013-02-08 MED ORDER — SODIUM CHLORIDE 0.9 % IJ SOLN
3.0000 mL | Freq: Two times a day (BID) | INTRAMUSCULAR | Status: DC
  Administered 2013-02-08 – 2013-02-12 (×10): 3 mL via INTRAVENOUS

## 2013-02-08 MED ORDER — ALBUTEROL SULFATE (5 MG/ML) 0.5% IN NEBU
2.5000 mg | INHALATION_SOLUTION | Freq: Four times a day (QID) | RESPIRATORY_TRACT | Status: DC
  Filled 2013-02-08 (×2): qty 0.5

## 2013-02-08 NOTE — ED Provider Notes (Signed)
CSN: [id number]     Arrival date & time 02/08/13  4098 History   First MD Initiated Contact with Patient 02/08/13 0935     Chief Complaint  Patient presents with  . Shortness of Breath   (Consider location/radiation/quality/duration/timing/severity/associated sxs/prior Treatment) HPI.... level V caveat for urgent intervention. Patient has COPD and uses a home nebulizer machine. She has been coughing, short of breath, wheezing for several days with productive sputum. EMS transferred to the emergency department. Given breathing treatments and IV  Solumedrol.  Severity is moderate to severe  Past Medical History  Diagnosis Date  . CAD (coronary artery disease)     a. s/p CABG x5 (2005) b. s/p BMS-prox SVG-D2 (2014)  . Hypertension   . Hyperlipidemia   . GI bleed     AVMs  . Aneurysm, aortic     thoracic aorta, stable at 4.1 cm, chest CT, May, 2012  . Syncope     Nitroglycerin plus a diuretic, April, 2009  . Hyponatremia     Chronic. Felt secondary to SIADH   . COPD (chronic obstructive pulmonary disease)   . Tobacco abuse   . Bradycardia   . Carotid artery disease     Hx of endarterectomy. Doppler October, 2011, stable, 0-39% RIC A., 40-59% LICA  . Tuberculosis     Exposures to tuberculosis 1970s, has tested negative by the health Department  . Atrial fibrillation     Paroxysmal with RVR 07/2011, not felt to be a coumadin candidate secondary to hx of GIB/AVM  . Venous stasis of lower extremity     Chronic  . Ejection fraction     a. EF 55-60%, echo, April, 2013 b. EF 55-60%, mild biatrial enlargement and PASP 42 mmH  . CHF with left ventricular diastolic dysfunction, NYHA class 2   . AAA (abdominal aortic aneurysm)   . On home O2     2L N/C  . Complication of anesthesia   . Shortness of breath   . Hx of CABG     CABG 2005  . Hematoma     Right groin hematoma, small, post cath, April, 2014  . Mitral regurgitation     Mild, hospital, March, 2014  . Leg ulcer     Ulcerated  lesion on the anterior aspect of her left lower leg, June, 2014   Past Surgical History  Procedure Laterality Date  . Cholecystectomy    . Carotid endartercetomy    . Abdominal hysterectomy    . Coronary artery bypass graft  2005  . Coronary angioplasty with stent placement  07/18/12    severe native coronary artery disease, 99% proximal stenosis of the SVG-D2 status post successful PTCA/PCI with a Veriflex bare-metal stent, widely patent SVGs to both the right PDA sequential OM1 to OM 2, EF 65-70% and 3+ mitral regurgitation   Family History  Problem Relation Age of Onset  . Stroke Sister   . Heart failure Mother   . Cancer Sister     Breast and Lung   . Cancer Brother     breast cancer   History  Substance Use Topics  . Smoking status: Former Smoker -- 0.50 packs/day for 59 years    Types: Cigarettes    Quit date: 02/04/2012  . Smokeless tobacco: Not on file  . Alcohol Use: No   OB History   Grav Para Term Preterm Abortions TAB SAB Ect Mult Living  Review of Systems  Unable to perform ROS: Acuity of condition    Allergies  Codeine; Ranitidine hcl; Sulfonamide derivatives; and Penicillins  Home Medications   Current Outpatient Rx  Name  Route  Sig  Dispense  Refill  . albuterol (PROAIR HFA) 108 (90 BASE) MCG/ACT inhaler   Inhalation   Inhale 2 puffs into the lungs every 6 (six) hours as needed for wheezing.   3 Inhaler   3   . albuterol (PROVENTIL) (2.5 MG/3ML) 0.083% nebulizer solution   Nebulization   Take 2.5 mg by nebulization every 6 (six) hours as needed for wheezing.         Marland Kitchen aspirin EC 81 MG tablet   Oral   Take 81 mg by mouth daily.         Marland Kitchen atorvastatin (LIPITOR) 40 MG tablet   Oral   Take 40 mg by mouth at bedtime.         . bisoprolol (ZEBETA) 10 MG tablet   Oral   Take 10 mg by mouth daily.         . budesonide-formoterol (SYMBICORT) 160-4.5 MCG/ACT inhaler   Inhalation   Inhale 2 puffs into the lungs 2 (two)  times daily.         Marland Kitchen diltiazem (CARDIZEM CD) 240 MG 24 hr capsule   Oral   Take 240 mg by mouth daily.         . furosemide (LASIX) 40 MG tablet   Oral   Take 1 tablet (40 mg total) by mouth daily.   90 tablet   1   . IPRATROPIUM BROMIDE NA   Nasal   Place 2 sprays into the nose daily as needed (for stuffiness/wheezing).          Marland Kitchen losartan (COZAAR) 50 MG tablet   Oral   Take 50 mg by mouth daily.         . Multiple Vitamins-Minerals (PRESERVISION AREDS 2 PO)   Oral   Take 1 tablet by mouth 2 (two) times daily.         . pantoprazole (PROTONIX) 40 MG tablet   Oral   Take 40 mg by mouth daily.         . potassium chloride SA (K-DUR,KLOR-CON) 20 MEQ tablet   Oral   Take 20 mEq by mouth 2 (two) times daily.         Marland Kitchen senna-docusate (SENOKOT-S) 8.6-50 MG per tablet   Oral   Take 1 tablet by mouth daily.         Marland Kitchen tiotropium (SPIRIVA) 18 MCG inhalation capsule   Inhalation   Place 18 mcg into inhaler and inhale daily.          . Vitamin D, Ergocalciferol, (DRISDOL) 50000 UNITS CAPS   Oral   Take 50,000 Units by mouth every 14 (fourteen) days. Every other Sunday         . hydroxypropyl methylcellulose (ISOPTO TEARS) 2.5 % ophthalmic solution   Both Eyes   Place 1 drop into both eyes daily as needed (for dry eyes).         . nitroGLYCERIN (NITROSTAT) 0.4 MG SL tablet   Sublingual   Place 0.4 mg under the tongue every 5 (five) minutes as needed for chest pain.          BP 122/47  Pulse 82  Temp(Src) 98.9 F (37.2 C) (Oral)  Resp 22  Ht 5\' 11"  (1.803 m)  Wt 175 lb (79.379 kg)  BMI 24.42 kg/m2  SpO2 98% Physical Exam  Nursing note and vitals reviewed. Constitutional: She is oriented to person, place, and time.  Moderate respiratory distress  HENT:  Head: Normocephalic and atraumatic.  Eyes: Conjunctivae and EOM are normal. Pupils are equal, round, and reactive to light.  Neck: Normal range of motion. Neck supple.  Cardiovascular:  Normal rate, regular rhythm and normal heart sounds.   Pulmonary/Chest:  Tachypnea, wheezing  Abdominal: Soft. Bowel sounds are normal.  Musculoskeletal: Normal range of motion.  Neurological: She is alert and oriented to person, place, and time.  Skin: Skin is warm and dry.  Psychiatric: She has a normal mood and affect.    ED Course  Procedures (including critical care time) Labs Review Labs Reviewed  CBC WITH DIFFERENTIAL - Abnormal; Notable for the following:    WBC 11.9 (*)    Neutrophils Relative % 84 (*)    Neutro Abs 10.0 (*)    Lymphocytes Relative 10 (*)    All other components within normal limits  BASIC METABOLIC PANEL - Abnormal; Notable for the following:    Chloride 94 (*)    Glucose, Bld 145 (*)    GFR calc non Af Amer 53 (*)    GFR calc Af Amer 62 (*)    All other components within normal limits   Imaging Review Dg Chest 2 View  02/08/2013   CLINICAL DATA:  Shortness of breath, cough and congestion.  EXAM: CHEST  2 VIEW  COMPARISON:  09/29/2012.  FINDINGS: Trachea is midline. Heart size normal. Biapical pleural parenchymal scarring. Lungs are hyperinflated but otherwise clear. Mild mild loss of vertebral body height in the thoracic spine appears unchanged.  IMPRESSION: No acute findings.   Electronically Signed   By: Leanna Battles M.D.   On: 02/08/2013 10:33    EKG Interpretation     Ventricular Rate:  76 PR Interval:  64 QRS Duration: 88 QT Interval:  384 QTC Calculation: 432 R Axis:   30 Text Interpretation:  Sinus rhythm Short PR interval Abnormal R-wave progression, early transition            MDM   1. COPD exacerbation    Patient has COPD with exacerbation preceded by upper respiratory infection.   She feels better after breathing treatments and IV steroids.   IV Levaquin started at request of admitting physician.   Admit to hospitalist.    Donnetta Hutching, MD 02/08/13 1258

## 2013-02-08 NOTE — ED Notes (Signed)
Per EMS-* Pt comes from home where she reports SOB for several days, coughing up yellow sputum. Reports CP with coughing. Was given 324 aspirin. Given 2 albuterol treatments with atrovent total of 10 albuterol, 1 atrovent. 125 solumedrol. BP 144/86, HR 74, SR with PVC. Pt is a x 4. 18 L. AC

## 2013-02-08 NOTE — ED Notes (Signed)
Attempted report 

## 2013-02-08 NOTE — H&P (Signed)
[facility] History and Physical  [name] WJX:[id number] DOB: 1929-05-28 DOA: 02/08/2013  Referring physician: Dr. Donnetta Hutching, EDP PCP: Kaleen Mask, MD  Outpatient Specialists:  1. Cardiology: Dr. Willa Rough. 2. Pulmonology: Dr. Cyril Mourning  Chief Complaint: Cough and worsening dyspnea  HPI: [name] is a 79 y.o. female with history of COPD, chronic respiratory failure on home O2-2 L per minute, ongoing some days tobacco abuse, A. fib-not anticoagulation candidate secondary to GI bleed from AVMs  , chronic diastolic CHF, AAA, CAD, CABG, carotid artery disease, chronic hyponatremia/SIADH presented to the ED on 02/08/13 secondary to cough and worsening dyspnea. Patient gives a one-week history of cough with intermittent yellow-brown sputum, feeding hot and cold but did not check temperatures, progressively worsening dyspnea and wheezing. She had a couple of spells of worsening dyspnea yesterday which resolved after home remedies. Today her dyspnea got worse and EMS brought her to the ED. She received a couple of nebulizer treatments and a dose of the albuterol on route to ED. She also complains of some midsternal chest pain, unclear nature, nonradiating with no aggravating or relieving factors. She states that it's because of indigestion. In the ED, chest x-ray negative for acute findings, labwork unremarkable. Hospitalist admission requested for further evaluation and management.  Review of Systems: All systems reviewed and apart from history of presenting illness, are negative.  Past Medical History  Diagnosis Date  . CAD (coronary artery disease)     a. s/p CABG x5 (2005) b. s/p BMS-prox SVG-D2 (2014)  . Hypertension   . Hyperlipidemia   . GI bleed     AVMs  . Aneurysm, aortic     thoracic aorta, stable at 4.1 cm, chest CT, May, 2012  . Syncope     Nitroglycerin plus a diuretic, April, 2009  . Hyponatremia     Chronic. Felt secondary to SIADH   . COPD  (chronic obstructive pulmonary disease)   . Tobacco abuse   . Bradycardia   . Carotid artery disease     Hx of endarterectomy. Doppler October, 2011, stable, 0-39% RIC A., 40-59% LICA  . Tuberculosis     Exposures to tuberculosis 1970s, has tested negative by the health Department  . Atrial fibrillation     Paroxysmal with RVR 07/2011, not felt to be a coumadin candidate secondary to hx of GIB/AVM  . Venous stasis of lower extremity     Chronic  . Ejection fraction     a. EF 55-60%, echo, April, 2013 b. EF 55-60%, mild biatrial enlargement and PASP 42 mmH  . CHF with left ventricular diastolic dysfunction, NYHA class 2   . AAA (abdominal aortic aneurysm)   . On home O2     2L N/C  . Complication of anesthesia   . Shortness of breath   . Hx of CABG     CABG 2005  . Hematoma     Right groin hematoma, small, post cath, April, 2014  . Mitral regurgitation     Mild, hospital, March, 2014  . Leg ulcer     Ulcerated lesion on the anterior aspect of her left lower leg, June, 2014   Past Surgical History  Procedure Laterality Date  . Cholecystectomy    . Carotid endartercetomy    . Abdominal hysterectomy    . Coronary artery bypass graft  2005  . Coronary angioplasty with stent placement  07/18/12    severe native coronary artery disease, 99% proximal stenosis of the SVG-D2  status post successful PTCA/PCI with a Veriflex bare-metal stent, widely patent SVGs to both the right PDA sequential OM1 to OM 2, EF 65-70% and 3+ mitral regurgitation   Social History:  reports that she has been smoking Cigarettes.  She has a 29.5 pack-year smoking history. She does not have any smokeless tobacco history on file. She reports that she does not drink alcohol. Her drug history is not on file.  Widowed. Lives alone and mostly independent of activities of daily living. At times uses a cane to walk.  Allergies  Allergen Reactions  . Codeine Other (See Comments)    unknown  . Ranitidine Hcl Other (See  Comments)    unknown  . Sulfonamide Derivatives Nausea And Vomiting  . Penicillins Rash and Other (See Comments)    unknown    Family History  Problem Relation Age of Onset  . Stroke Sister   . Heart failure Mother   . Cancer Sister     Breast and Lung   . Cancer Brother     breast cancer    Prior to Admission medications   Medication Sig Start Date End Date Taking? Authorizing Provider  albuterol (PROAIR HFA) 108 (90 BASE) MCG/ACT inhaler Inhale 2 puffs into the lungs every 6 (six) hours as needed for wheezing. 12/05/12  Yes Oretha Milch, MD  albuterol (PROVENTIL) (2.5 MG/3ML) 0.083% nebulizer solution Take 2.5 mg by nebulization every 6 (six) hours as needed for wheezing.   Yes Historical Provider, MD  aspirin EC 81 MG tablet Take 81 mg by mouth daily.   Yes Historical Provider, MD  atorvastatin (LIPITOR) 40 MG tablet Take 40 mg by mouth at bedtime.   Yes Historical Provider, MD  bisoprolol (ZEBETA) 10 MG tablet Take 10 mg by mouth daily.   Yes Historical Provider, MD  budesonide-formoterol (SYMBICORT) 160-4.5 MCG/ACT inhaler Inhale 2 puffs into the lungs 2 (two) times daily.   Yes Historical Provider, MD  diltiazem (CARDIZEM CD) 240 MG 24 hr capsule Take 240 mg by mouth daily.   Yes Historical Provider, MD  furosemide (LASIX) 40 MG tablet Take 1 tablet (40 mg total) by mouth daily. 10/19/12  Yes Luis Abed, MD  IPRATROPIUM BROMIDE NA Place 2 sprays into the nose daily as needed (for stuffiness/wheezing).    Yes Historical Provider, MD  losartan (COZAAR) 50 MG tablet Take 50 mg by mouth daily.   Yes Historical Provider, MD  Multiple Vitamins-Minerals (PRESERVISION AREDS 2 PO) Take 1 tablet by mouth 2 (two) times daily.   Yes Historical Provider, MD  pantoprazole (PROTONIX) 40 MG tablet Take 40 mg by mouth daily.   Yes Historical Provider, MD  potassium chloride SA (K-DUR,KLOR-CON) 20 MEQ tablet Take 20 mEq by mouth 2 (two) times daily.   Yes Historical Provider, MD   senna-docusate (SENOKOT-S) 8.6-50 MG per tablet Take 1 tablet by mouth daily.   Yes Historical Provider, MD  tiotropium (SPIRIVA) 18 MCG inhalation capsule Place 18 mcg into inhaler and inhale daily.    Yes Historical Provider, MD  Vitamin D, Ergocalciferol, (DRISDOL) 50000 UNITS CAPS Take 50,000 Units by mouth every 14 (fourteen) days. Every other Sunday   Yes Historical Provider, MD  hydroxypropyl methylcellulose (ISOPTO TEARS) 2.5 % ophthalmic solution Place 1 drop into both eyes daily as needed (for dry eyes).    Historical Provider, MD  nitroGLYCERIN (NITROSTAT) 0.4 MG SL tablet Place 0.4 mg under the tongue every 5 (five) minutes as needed for chest pain.  Historical Provider, MD   Physical Exam: Filed Vitals:   02/08/13 1008 02/08/13 1055 02/08/13 1130 02/08/13 1300  BP:  123/49 122/47 108/46  Pulse:  85 82 80  Temp:      TempSrc:      Resp:  18 22 16   Height:      Weight:      SpO2: 100% 100% 98% 96%     General exam: Moderately built and nourished female patient, lying comfortably supine on the gurney in no obvious distress.  Head, eyes and ENT: Nontraumatic and normocephalic. Pupils equally reacting to light and accommodation. Oral mucosa moist. Missing multiple teeth.  Neck: Supple. No JVD, carotid bruit or thyromegaly.  Lymphatics: No lymphadenopathy.  Respiratory system:  Decreased breath sounds bilaterally/distant breath sounds. Occasional rhonchi posteriorly. Able to speak in full sentences. No increased work of breathing.  Cardiovascular system: S1 and S2 heard, RRR. No JVD, murmurs, gallops, clicks or pedal edema. Telemetry: Sinus rhythm  Gastrointestinal system: Abdomen is nondistended, soft and nontender. Normal bowel sounds heard. No organomegaly or masses appreciated.  Central nervous system: Alert and oriented. No focal neurological deficits.  Extremities: Symmetric 5 x 5 power. Peripheral pulses symmetrically felt.   Skin: No rashes or acute  findings.  Musculoskeletal system: Negative exam.  Psychiatry: Pleasant and cooperative.   Labs on Admission:  Basic Metabolic Panel:  Recent Labs Lab 02/08/13 1015  NA 135  K 3.5  CL 94*  CO2 31  GLUCOSE 145*  BUN 18  CREATININE 0.97  CALCIUM 9.9   Liver Function Tests: No results found for this basename: AST, ALT, ALKPHOS, BILITOT, PROT, ALBUMIN,  in the last 168 hours No results found for this basename: LIPASE, AMYLASE,  in the last 168 hours No results found for this basename: AMMONIA,  in the last 168 hours CBC:  Recent Labs Lab 02/08/13 1015  WBC 11.9*  NEUTROABS 10.0*  HGB 12.4  HCT 36.7  MCV 92.0  PLT 162   Cardiac Enzymes: No results found for this basename: CKTOTAL, CKMB, CKMBINDEX, TROPONINI,  in the last 168 hours  BNP (last 3 results)  Recent Labs  02/13/12 1210  PROBNP 1239.0*   CBG: No results found for this basename: GLUCAP,  in the last 168 hours  Radiological Exams on Admission: Dg Chest 2 View  02/08/2013   CLINICAL DATA:  Shortness of breath, cough and congestion.  EXAM: CHEST  2 VIEW  COMPARISON:  09/29/2012.  FINDINGS: Trachea is midline. Heart size normal. Biapical pleural parenchymal scarring. Lungs are hyperinflated but otherwise clear. Mild mild loss of vertebral body height in the thoracic spine appears unchanged.  IMPRESSION: No acute findings.   Electronically Signed   By: Leanna Battles M.D.   On: 02/08/2013 10:33    EKG: Independently reviewed.  Sinus rhythm without acute changes. QTC: 432 ms.  Assessment/Plan Principal Problem:   Acute exacerbation of chronic obstructive pulmonary disease (COPD) Active Problems:   Hypertension   Hyperlipidemia   Tobacco abuse   Hx of CABG   Chronic respiratory failure   Atrial fibrillation   Chronic diastolic CHF (congestive heart failure)   CAD (coronary artery disease)    COPD exacerbation/chronic respiratory failure/tobacco abuse - Continue oxygen, bronchodilator  nebulizations, Levaquin and steroids. - Tobacco cessation counseled.  Hypertension - Controlled - Continue home medications.  Atypical chest pain /history of CAD, CABG - EKG without acute changes. - Cycle cardiac enzymes.   History of paroxysmal A. fib/chronic diastolic CHF - Currently in  sinus rhythm. - Not a candidate for anticoagulation secondary to bleeding risk - Continue oral Cardizem - CHF clinically compensated. Continue Lasix.  History of hyperlipidemia - Continue statins     Code Status: Full  Family Communication: None  Disposition Plan: Home when medically stable.   Time spent: 55 minutes  Yaire Kreher, MD, FACP, FHM. [facility] Pager [phone]  If 7PM-7AM, please contact night-coverage [email or web address] Password [email or web address] 02/08/2013, 1:52 PM

## 2013-02-08 NOTE — ED Notes (Signed)
MD at bedside. 

## 2013-02-09 DIAGNOSIS — J9600 Acute respiratory failure, unspecified whether with hypoxia or hypercapnia: Secondary | ICD-10-CM

## 2013-02-09 LAB — TROPONIN I: Troponin I: <0.3 ng/mL (ref <0.30)

## 2013-02-09 MED ORDER — ALBUTEROL SULFATE HFA 108 (90 BASE) MCG/ACT IN AERS
2.0000 | INHALATION_SPRAY | RESPIRATORY_TRACT | Status: DC | PRN
  Administered 2013-02-09 – 2013-02-11 (×7): 2 via RESPIRATORY_TRACT
  Filled 2013-02-09: qty 6.7

## 2013-02-09 MED ORDER — TIOTROPIUM BROMIDE MONOHYDRATE 18 MCG IN CAPS
18.0000 ug | ORAL_CAPSULE | Freq: Every day | RESPIRATORY_TRACT | Status: DC
  Filled 2013-02-09: qty 5

## 2013-02-09 MED ORDER — TIOTROPIUM BROMIDE MONOHYDRATE 18 MCG IN CAPS
18.0000 ug | ORAL_CAPSULE | Freq: Every day | RESPIRATORY_TRACT | Status: DC
  Administered 2013-02-09 – 2013-02-13 (×5): 18 ug via RESPIRATORY_TRACT
  Filled 2013-02-09: qty 5

## 2013-02-09 NOTE — Progress Notes (Signed)
SATURATION QUALIFICATIONS: (This note is used to comply with regulatory documentation for home oxygen)  Patient Saturations on Room Air at Rest = 93%  Patient Saturations on Room Air while Ambulating = 88%%  Patient Saturations on 2 Liters of oxygen while Ambulating = 94-96%  Please briefly explain why patient needs home oxygen:Pt desats on RA when not wearing O2. With O2, sats >90%.   Thanks. [facility] [phone] (pager)

## 2013-02-09 NOTE — Progress Notes (Signed)
Pt  Wanting to know if she is going to be d/c today, Dr. Mahala Menghini notified and instructed that pt will be d/c today.  Pt made aware.  Amanda Pea, Charity fundraiser.

## 2013-02-09 NOTE — Progress Notes (Signed)
Utilization Review Completed.   Irene Collings, RN, BSN Nurse Case Manager  [phone]  

## 2013-02-09 NOTE — Progress Notes (Signed)
[facility] PROGRESS NOTE Assessment/Plan: Acute Chronic respiratory failure Atypical chest pain/*Acute exacerbation of chronic obstructive pulmonary disease (COPD) - On steroids, Levaquin and inhalers - ambulate on home O2. - 96% 2L  Chronic diastolic CHF (congestive heart failure) - compensated.     Atrial fibrillation - Currently in sinus rhythm.  - Not a candidate for anticoagulation secondary to bleeding risk  - Continue oral Cardizem     Hypertension  - Controlled  - Continue home medications.   Tobacco abuse: - counseling.    Code Status: Full  Family Communication: None  Disposition Plan: Home when medically stable.     Consultants:  none  Procedures:  CXR  Antibiotics:  levaquin  HPI/Subjective: Relates she did not have a good sleep. SOB improved.  Objective: Filed Vitals:   02/08/13 2047 02/09/13 0140 02/09/13 0500 02/09/13 0519  BP: 116/62 118/53 124/54   Pulse: 91 76 74   Temp: 97.5 F (36.4 C) 97.6 F (36.4 C) 98.3 F (36.8 C)   TempSrc: Oral Oral Oral   Resp: 18 16 14    Height:      Weight:    77 kg (169 lb 12.1 oz)  SpO2: 94% 98% 96%     Intake/Output Summary (Last 24 hours) at 02/09/13 0839 Last data filed at 02/09/13 0836  Gross per 24 hour  Intake    580 ml  Output   1025 ml  Net   -445 ml   Filed Weights   02/08/13 0938 02/08/13 1717 02/09/13 0519  Weight: 79.379 kg (175 lb) 77.4 kg (170 lb 10.2 oz) 77 kg (169 lb 12.1 oz)    Exam:  General: Alert, awake, oriented x3, in no acute distress.  HEENT: No bruits, no goiter.  Heart: Regular rate and rhythm, without murmurs, rubs, gallops.  Lungs: moderate air movement, no wheezing. Abdomen: Soft, nontender, nondistended, positive bowel sounds.    Data Reviewed: Basic Metabolic Panel:  Recent Labs Lab 02/08/13 1015  NA 135  K 3.5  CL 94*  CO2 31  GLUCOSE 145*  BUN 18  CREATININE 0.97  CALCIUM 9.9   Liver Function Tests: No results found for this  basename: AST, ALT, ALKPHOS, BILITOT, PROT, ALBUMIN,  in the last 168 hours No results found for this basename: LIPASE, AMYLASE,  in the last 168 hours No results found for this basename: AMMONIA,  in the last 168 hours CBC:  Recent Labs Lab 02/08/13 1015  WBC 11.9*  NEUTROABS 10.0*  HGB 12.4  HCT 36.7  MCV 92.0  PLT 162   Cardiac Enzymes:  Recent Labs Lab 02/08/13 1518 02/08/13 2023 02/09/13 0200  TROPONINI <0.30 <0.30 <0.30   BNP (last 3 results)  Recent Labs  02/13/12 1210  PROBNP 1239.0*   CBG: No results found for this basename: GLUCAP,  in the last 168 hours  No results found for this or any previous visit (from the past 240 hour(s)).   Studies: Dg Chest 2 View  02/08/2013   CLINICAL DATA:  Shortness of breath, cough and congestion.  EXAM: CHEST  2 VIEW  COMPARISON:  09/29/2012.  FINDINGS: Trachea is midline. Heart size normal. Biapical pleural parenchymal scarring. Lungs are hyperinflated but otherwise clear. Mild mild loss of vertebral body height in the thoracic spine appears unchanged.  IMPRESSION: No acute findings.   Electronically Signed   By: Leanna Battles M.D.   On: 02/08/2013 10:33    Scheduled Meds: . albuterol  2.5 mg Nebulization QID  . albuterol  5 mg Nebulization Once  . aspirin EC  81 mg Oral Daily  . atorvastatin  40 mg Oral q1800  . bisoprolol  10 mg Oral Daily  . diltiazem  240 mg Oral Daily  . furosemide  40 mg Oral Daily  . guaiFENesin  600 mg Oral BID  . ipratropium  0.5 mg Nebulization Once  . ipratropium  0.5 mg Nebulization QID  . levofloxacin (LEVAQUIN) IV  500 mg Intravenous Q24H  . losartan  50 mg Oral Daily  . methylPREDNISolone (SOLU-MEDROL) injection  60 mg Intravenous Q12H  . pantoprazole  40 mg Oral Daily  . potassium chloride SA  20 mEq Oral BID  . senna-docusate  1 tablet Oral Daily  . sodium chloride  3 mL Intravenous Q12H   Continuous Infusions:    Marinda Elk  [facility] Pager [phone]. If  8PM-8AM, please contact night-coverage at [email or web address], password [facility] 02/09/2013, 8:39 AM  LOS: 1 day

## 2013-02-09 NOTE — Progress Notes (Signed)
Physical Therapy Evaluation Patient Details Name: [name] MRN: [id number] DOB: 1929/06/16 Today's Date: 02/09/2013 Time: 1142-1209 PT Time Calculation (min): 27 min  PT Assessment / Plan / Recommendation History of Present Illness  Pt admit with COPD and CHF.   Clinical Impression  Pt admitted with above. Pt currently with functional limitations due to the deficits listed below (see PT Problem List). Pt will benefit from skilled PT to increase their independence and safety with mobility to allow discharge to the venue listed below.     PT Assessment  Patient needs continued PT services    Follow Up Recommendations  SNF;Supervision/Assistance - 24 hour        Barriers to Discharge Decreased caregiver support      Equipment Recommendations  None recommended by PT         Frequency Min 3X/week    Precautions / Restrictions Precautions Precautions: Fall Restrictions Weight Bearing Restrictions: No   Pertinent Vitals/Pain VSS, no pain      Mobility  Bed Mobility Bed Mobility: Rolling Right;Right Sidelying to Sit Rolling Right: 4: Min guard Right Sidelying to Sit: 4: Min assist Transfers Transfers: Sit to Stand;Stand to Sit Sit to Stand: 4: Min assist;With upper extremity assist;From bed Stand to Sit: 4: Min assist;With upper extremity assist;To bed Details for Transfer Assistance: Cues needed for hand placement. Steadying assist needed upon initial stand.   Ambulation/Gait Ambulation/Gait Assistance: 4: Min assist Ambulation Distance (Feet): 35 Feet (35 feet x2) Assistive device: Rolling walker Ambulation/Gait Assistance Details: Pt ambulated with RW with dizziness and report of weakness.  Dizzier when she ambulated without O2. Pt on 2LO2 at rest was 98%.  O2 at rest RA was 93%.  Pt O2 dropped to 88% after 15 feet of ambulation and pt dizzy.  Replaced O2 and O2 94-96%.   Gait Pattern: Step-through pattern;Decreased stride length Gait velocity:  decreased Stairs: No Wheelchair Mobility Wheelchair Mobility: No         PT Diagnosis: Generalized weakness  PT Problem List: Decreased activity tolerance;Decreased balance;Decreased mobility;Decreased knowledge of use of DME;Decreased safety awareness;Decreased knowledge of precautions PT Treatment Interventions: Gait training;DME instruction;Functional mobility training;Therapeutic activities;Therapeutic exercise;Balance training;Patient/family education     PT Goals(Current goals can be found in the care plan section) Acute Rehab PT Goals Patient Stated Goal: to go home PT Goal Formulation: With patient Time For Goal Achievement: 02/23/13 Potential to Achieve Goals: Good  Visit Information  Last PT Received On: 02/09/13 Assistance Needed: +1 History of Present Illness: Pt admit with COPD and CHF.        Prior Functioning  Home Living Family/patient expects to be discharged to:: Private residence Living Arrangements: Alone Available Help at Discharge: Available PRN/intermittently Type of Home: House Home Access: Ramped entrance Home Layout: One level Home Equipment: Cane - single point;Hospital bed;Walker - 2 wheels;Walker - standard;Walker - 4 wheels;Bedside commode Prior Function Level of Independence: Independent with assistive device(s) Communication Communication: No difficulties    Cognition  Cognition Arousal/Alertness: Awake/alert Behavior During Therapy: Anxious Overall Cognitive Status: Within Functional Limits for tasks assessed    Extremity/Trunk Assessment Upper Extremity Assessment Upper Extremity Assessment: Defer to OT evaluation Lower Extremity Assessment Lower Extremity Assessment: Generalized weakness Cervical / Trunk Assessment Cervical / Trunk Assessment: Normal   Balance Balance Balance Assessed: Yes Static Standing Balance Static Standing - Balance Support: Bilateral upper extremity supported;During functional activity Static Standing -  Level of Assistance: 4: Min assist Static Standing - Comment/# of Minutes: 5 min with RW.  End of Session PT - End of Session Equipment Utilized During Treatment: Gait belt;Oxygen Activity Tolerance: Patient limited by fatigue Patient left: in chair;with call bell/phone within reach Nurse Communication: Mobility status       INGOLD,[name] 02/09/2013, 1:51 PM [facility] Acute Rehabilitation [phone] (pager)

## 2013-02-10 DIAGNOSIS — I4891 Unspecified atrial fibrillation: Secondary | ICD-10-CM

## 2013-02-10 MED ORDER — LEVOFLOXACIN 500 MG PO TABS
500.0000 mg | ORAL_TABLET | Freq: Every day | ORAL | Status: DC
  Administered 2013-02-10 – 2013-02-13 (×4): 500 mg via ORAL
  Filled 2013-02-10 (×4): qty 1

## 2013-02-10 MED ORDER — LEVOFLOXACIN 500 MG PO TABS: 500.0000 mg | ORAL_TABLET | Freq: Every day | ORAL | Status: AC

## 2013-02-10 MED ORDER — PREDNISONE 10 MG PO TABS: ORAL_TABLET | ORAL | Status: DC

## 2013-02-10 MED ORDER — PREDNISONE 50 MG PO TABS
50.0000 mg | ORAL_TABLET | Freq: Every day | ORAL | Status: DC
  Administered 2013-02-11 – 2013-02-13 (×3): 50 mg via ORAL
  Filled 2013-02-10 (×4): qty 1

## 2013-02-10 NOTE — Progress Notes (Signed)
Patient is requesting SNF placement rather than return home. Fl2 completed. Bed search will be initiated and arrangements for placement as soon as possible.  Per MD- patient is medically stable for d/c.  Lorri Frederick. [place]  [phone]

## 2013-02-10 NOTE — Progress Notes (Signed)
Pt required PRN inhal. After getting up to [facility]

## 2013-02-10 NOTE — Progress Notes (Signed)
Pt o3x no complaints of pain or sob. Pt weak pluses in LE, used doppler. Cap refill 4-5sec. Warm to touch. Will continue to monitor

## 2013-02-10 NOTE — Progress Notes (Signed)
[facility] PROGRESS NOTE Assessment/Plan: Acute Chronic respiratory failure Atypical chest pain/*Acute exacerbation of chronic obstructive pulmonary disease (COPD) - Change steroids, Levaquin to orals and inhalers - ambulate on home O2. - remained 88% on ambulation.  Chronic diastolic CHF (congestive heart failure) - compensated.     Atrial fibrillation - Currently in sinus rhythm.  - Not a candidate for anticoagulation secondary to bleeding risk  - Continue oral Cardizem     Hypertension  - Controlled  - Continue home medications.   Tobacco abuse: - counseling.    Code Status: Full  Family Communication: None  Disposition Plan: Home when medically stable.     Consultants:  none  Procedures:  CXR  Antibiotics:  levaquin  HPI/Subjective: SOB improved.  Objective: Filed Vitals:   02/09/13 1306 02/09/13 2106 02/10/13 0111 02/10/13 0406  BP: 130/67 118/52 134/63 126/61  Pulse: 77 74 72 63  Temp: 98 F (36.7 C) 98.1 F (36.7 C) 98.4 F (36.9 C) 98.1 F (36.7 C)  TempSrc: Oral Oral Oral Oral  Resp: 18 18 22 20   Height:      Weight:    77.973 kg (171 lb 14.4 oz)  SpO2: 94% 97% 97% 98%    Intake/Output Summary (Last 24 hours) at 02/10/13 0928 Last data filed at 02/09/13 1833  Gross per 24 hour  Intake    580 ml  Output    401 ml  Net    179 ml   Filed Weights   02/08/13 1717 02/09/13 0519 02/10/13 0406  Weight: 77.4 kg (170 lb 10.2 oz) 77 kg (169 lb 12.1 oz) 77.973 kg (171 lb 14.4 oz)    Exam:  General: Alert, awake, oriented x3, in no acute distress.  HEENT: No bruits, no goiter.  Heart: Regular rate and rhythm, without murmurs, rubs, gallops.  Lungs: moderate air movement, no wheezing. Abdomen: Soft, nontender, nondistended, positive bowel sounds.    Data Reviewed: Basic Metabolic Panel:  Recent Labs Lab 02/08/13 1015  NA 135  K 3.5  CL 94*  CO2 31  GLUCOSE 145*  BUN 18  CREATININE 0.97  CALCIUM 9.9   Liver Function  Tests: No results found for this basename: AST, ALT, ALKPHOS, BILITOT, PROT, ALBUMIN,  in the last 168 hours No results found for this basename: LIPASE, AMYLASE,  in the last 168 hours No results found for this basename: AMMONIA,  in the last 168 hours CBC:  Recent Labs Lab 02/08/13 1015  WBC 11.9*  NEUTROABS 10.0*  HGB 12.4  HCT 36.7  MCV 92.0  PLT 162   Cardiac Enzymes:  Recent Labs Lab 02/08/13 1518 02/08/13 2023 02/09/13 0200  TROPONINI <0.30 <0.30 <0.30   BNP (last 3 results)  Recent Labs  02/13/12 1210  PROBNP 1239.0*   CBG: No results found for this basename: GLUCAP,  in the last 168 hours  No results found for this or any previous visit (from the past 240 hour(s)).   Studies: Dg Chest 2 View  02/08/2013   CLINICAL DATA:  Shortness of breath, cough and congestion.  EXAM: CHEST  2 VIEW  COMPARISON:  09/29/2012.  FINDINGS: Trachea is midline. Heart size normal. Biapical pleural parenchymal scarring. Lungs are hyperinflated but otherwise clear. Mild mild loss of vertebral body height in the thoracic spine appears unchanged.  IMPRESSION: No acute findings.   Electronically Signed   By: Leanna Battles M.D.   On: 02/08/2013 10:33    Scheduled Meds: . albuterol  5 mg Nebulization Once  .  aspirin EC  81 mg Oral Daily  . atorvastatin  40 mg Oral q1800  . bisoprolol  10 mg Oral Daily  . diltiazem  240 mg Oral Daily  . furosemide  40 mg Oral Daily  . guaiFENesin  600 mg Oral BID  . levofloxacin  500 mg Oral Daily  . losartan  50 mg Oral Daily  . pantoprazole  40 mg Oral Daily  . potassium chloride SA  20 mEq Oral BID  . [START ON 02/11/2013] predniSONE  50 mg Oral Q breakfast  . senna-docusate  1 tablet Oral Daily  . sodium chloride  3 mL Intravenous Q12H  . tiotropium  18 mcg Inhalation Daily   Continuous Infusions:    Marinda Elk  [facility] Pager [phone]. If 8PM-8AM, please contact night-coverage at [email or web address], password  [facility] 02/10/2013, 9:28 AM  LOS: 2 days

## 2013-02-10 NOTE — Progress Notes (Signed)
Pt did not want to walk at this time. Pt got SOB setting up on edge of bed. NT said Pt got very sob when getting on BSC. MD aware. Will continue to monitor

## 2013-02-10 NOTE — Discharge Summary (Addendum)
Physician Discharge Summary  [name]:[id number] DOB: Mar 18, 1930 DOA: 02/08/2013  PCP: Kaleen Mask, MD  Admit date: 02/08/2013 Discharge date: 02/13/2013  Time spent: 35 minutes  Recommendations for Outpatient Follow-up:  1. Follow up with PCP check b-met   Discharge Diagnoses:  Principal Problem:   Acute exacerbation of chronic obstructive pulmonary disease (COPD) Active Problems:   Hypertension   Hyperlipidemia   Tobacco abuse   Hx of CABG   Chronic respiratory failure   Atrial fibrillation   Chronic diastolic CHF (congestive heart failure)   CAD (coronary artery disease)   Atypical chest pain   Acute respiratory failure   Discharge Condition: STABLE  Diet recommendation: heart healthy diet  Filed Weights   02/11/13 0445 02/12/13 0426 02/13/13 0639  Weight: 77.9 kg (171 lb 11.8 oz) 78.2 kg (172 lb 6.4 oz) 77.5 kg (170 lb 13.7 oz)    History of present illness:  79 y.o. female with history of COPD, chronic respiratory failure on home O2-2 L per minute, ongoing some days tobacco abuse, A. fib-not anticoagulation candidate secondary to GI bleed from AVMs  , chronic diastolic CHF, AAA, CAD, CABG, carotid artery disease, chronic hyponatremia/SIADH presented to the ED on 02/08/13 secondary to cough and worsening dyspnea. Patient gives a one-week history of cough with intermittent yellow-brown sputum, feeding hot and cold but did not check temperatures, progressively worsening dyspnea and wheezing. She had a couple of spells of worsening dyspnea yesterday which resolved after home remedies. Today her dyspnea got worse and EMS brought her to the ED. She received a couple of nebulizer treatments and a dose of the albuterol on route to ED. She also complains of some midsternal chest pain, unclear nature, nonradiating with no aggravating or relieving factors. She states that it's because of indigestion. In the ED, chest x-ray negative for acute findings, labwork  unremarkable. Hospitalist admission requested for further evaluation and management.   Hospital Course:  Acute Chronic respiratory failure Atypical chest pain/*Acute exacerbation of chronic obstructive pulmonary disease (COPD)  - started on IV steroids and levquin, Change steroids, Levaquin to orals and inhalers  - SOB improved, with more energy, - remained 88% on ambulation.   Chronic diastolic CHF (congestive heart failure)  - compensated.   Atrial fibrillation  - Currently in sinus rhythm.  - Not a candidate for anticoagulation secondary to bleeding risk  - Continue oral Cardizem   Hypertension  - Controlled  - Continue home medications.   Tobacco abuse:  - counseling.   Procedures:  CXR  Consultations:  none  Discharge Exam: Filed Vitals:   02/13/13 0639  BP: 152/62  Pulse: 55  Temp: 97.7 F (36.5 C)  Resp: 19    General: A&O x3 Cardiovascular: RRR Respiratory: good air movement CTA B/L  Discharge Instructions      Discharge Orders   Future Appointments Provider Department Dept Phone   02/23/2013 1:30 PM Runell Gess, MD [facility] [phone]   03/02/2013 2:00 PM Julio Sicks, NP [facility] [phone]   Future Orders Complete By Expires   Diet - low sodium heart healthy  As directed    Diet - low sodium heart healthy  As directed    Increase activity slowly  As directed    Increase activity slowly  As directed        Medication List         albuterol 108 (90 BASE) MCG/ACT inhaler  Commonly known as:  PROAIR HFA  Inhale  2 puffs into the lungs every 6 (six) hours as needed for wheezing.     albuterol (2.5 MG/3ML) 0.083% nebulizer solution  Commonly known as:  PROVENTIL  Take 2.5 mg by nebulization every 6 (six) hours as needed for wheezing.     aspirin EC 81 MG tablet  Take 81 mg by mouth daily.     atorvastatin 40 MG tablet  Commonly known as:  LIPITOR  Take 40 mg by mouth at bedtime.      bisoprolol 10 MG tablet  Commonly known as:  ZEBETA  Take 10 mg by mouth daily.     budesonide-formoterol 160-4.5 MCG/ACT inhaler  Commonly known as:  SYMBICORT  Inhale 2 puffs into the lungs 2 (two) times daily.     diltiazem 240 MG 24 hr capsule  Commonly known as:  CARDIZEM CD  Take 240 mg by mouth daily.     furosemide 40 MG tablet  Commonly known as:  LASIX  Take 1 tablet (40 mg total) by mouth daily.     hydroxypropyl methylcellulose 2.5 % ophthalmic solution  Commonly known as:  ISOPTO TEARS  Place 1 drop into both eyes daily as needed (for dry eyes).     IPRATROPIUM BROMIDE NA  Place 2 sprays into the nose daily as needed (for stuffiness/wheezing).     levofloxacin 500 MG tablet  Commonly known as:  LEVAQUIN  Take 1 tablet (500 mg total) by mouth daily.     losartan 50 MG tablet  Commonly known as:  COZAAR  Take 50 mg by mouth daily.     nitroGLYCERIN 0.4 MG SL tablet  Commonly known as:  NITROSTAT  Place 0.4 mg under the tongue every 5 (five) minutes as needed for chest pain.     pantoprazole 40 MG tablet  Commonly known as:  PROTONIX  Take 40 mg by mouth daily.     potassium chloride SA 20 MEQ tablet  Commonly known as:  K-DUR,KLOR-CON  Take 20 mEq by mouth 2 (two) times daily.     predniSONE 10 MG tablet  Commonly known as:  DELTASONE  Takes 6 tablets for 1 days, then 5 tablets for 1 days, then 4 tablets for 1 days, then 3 tablets for 1 days, then 2 tabs for 1 days, then 1 tab for 1 days, and then stop.     PRESERVISION AREDS 2 PO  Take 1 tablet by mouth 2 (two) times daily.     senna-docusate 8.6-50 MG per tablet  Commonly known as:  Senokot-S  Take 1 tablet by mouth daily.     tiotropium 18 MCG inhalation capsule  Commonly known as:  SPIRIVA  Place 18 mcg into inhaler and inhale daily.     Vitamin D (Ergocalciferol) 50000 UNITS Caps capsule  Commonly known as:  DRISDOL  Take 50,000 Units by mouth every 14 (fourteen) days. Every other Sunday        Allergies  Allergen Reactions  . Codeine Other (See Comments)    unknown  . Ranitidine Hcl Other (See Comments)    unknown  . Sulfonamide Derivatives Nausea And Vomiting  . Penicillins Rash and Other (See Comments)    unknown   Follow-up Information   Follow up with Kaleen Mask, MD In 2 weeks. (hospital follow up)    Specialty:  Family Medicine   Contact information:   [address] [place] [address] [phone]        The results of significant diagnostics from this  hospitalization (including imaging, microbiology, ancillary and laboratory) are listed below for reference.    Significant Diagnostic Studies: Dg Chest 2 View  02/08/2013   CLINICAL DATA:  Shortness of breath, cough and congestion.  EXAM: CHEST  2 VIEW  COMPARISON:  09/29/2012.  FINDINGS: Trachea is midline. Heart size normal. Biapical pleural parenchymal scarring. Lungs are hyperinflated but otherwise clear. Mild mild loss of vertebral body height in the thoracic spine appears unchanged.  IMPRESSION: No acute findings.   Electronically Signed   By: Leanna Battles M.D.   On: 02/08/2013 10:33    Microbiology: No results found for this or any previous visit (from the past 240 hour(s)).   Labs: Basic Metabolic Panel:  Recent Labs Lab 02/08/13 1015  NA 135  K 3.5  CL 94*  CO2 31  GLUCOSE 145*  BUN 18  CREATININE 0.97  CALCIUM 9.9   Liver Function Tests: No results found for this basename: AST, ALT, ALKPHOS, BILITOT, PROT, ALBUMIN,  in the last 168 hours No results found for this basename: LIPASE, AMYLASE,  in the last 168 hours No results found for this basename: AMMONIA,  in the last 168 hours CBC:  Recent Labs Lab 02/08/13 1015  WBC 11.9*  NEUTROABS 10.0*  HGB 12.4  HCT 36.7  MCV 92.0  PLT 162   Cardiac Enzymes:  Recent Labs Lab 02/08/13 1518 02/08/13 2023 02/09/13 0200  TROPONINI <0.30 <0.30 <0.30   BNP: BNP (last 3 results) No results found for this  basename: PROBNP,  in the last 8760 hours CBG: No results found for this basename: GLUCAP,  in the last 168 hours     Signed:  Marinda Elk  [facility] 02/13/2013, 9:30 AM

## 2013-02-11 NOTE — Progress Notes (Signed)
CSW spoke with [facility], unable to receive insurance auth over the weekend, however should have a bed available on Monday 10/20.  CSW will continue to follow to assist with discharge planning.

## 2013-02-11 NOTE — Progress Notes (Signed)
Pt required PRN inhaler at getting up from [facility]. Will continue to monitor

## 2013-02-11 NOTE — Progress Notes (Signed)
Pt lying in bed. No complaints of pain. Pt states she feels her breathing is a little better. Pt still needs PRN inhaler after ambulation. Sw page to work on bed placement. Pt does not currently have bed offers. Will continue  to monitor

## 2013-02-12 DIAGNOSIS — I509 Heart failure, unspecified: Secondary | ICD-10-CM

## 2013-02-12 DIAGNOSIS — I5032 Chronic diastolic (congestive) heart failure: Secondary | ICD-10-CM

## 2013-02-12 NOTE — Plan of Care (Signed)
Problem: Phase II Progression Outcomes Goal: Discharge plan remains appropriate-arrangements made Outcome: Completed/Met Date Met:  02/12/13 Plan for d/c to SNF probably 02/13/13

## 2013-02-12 NOTE — Progress Notes (Signed)
A/Ox4 and is ambulatory with 1 person assist. She had no c/o pain. She did have c/o SOB with execertion and prn inhaler treatment was given. She remains on 2 L [place] of oxygen.

## 2013-02-12 NOTE — Progress Notes (Signed)
[facility] PROGRESS NOTE Assessment/Plan: Acute Chronic respiratory failure Atypical chest pain/*Acute exacerbation of chronic obstructive pulmonary disease (COPD) - Cont steroids,inhalers and antibiotics. - feels better. - Awaiting SNF placement.  Chronic diastolic CHF (congestive heart failure): - compensated.     Atrial fibrillation: - Currently in sinus rhythm.  - Not a candidate for anticoagulation secondary to bleeding risk  - Continue oral Cardizem  Hypertension  - Controlled  - Continue home medications.  Tobacco abuse: - counseling.    Code Status: Full  Family Communication: None  Disposition Plan: Home when medically stable.     Consultants:  none  Procedures:  CXR  Antibiotics:  levaquin  HPI/Subjective: No complains.  Objective: Filed Vitals:   02/11/13 1314 02/11/13 1950 02/12/13 0426 02/12/13 0834  BP: 146/50 140/70 163/74   Pulse: 60 57 51   Temp: 98.6 F (37 C) 97.6 F (36.4 C) 97.5 F (36.4 C)   TempSrc: Oral Oral Oral   Resp: 18 20 20    Height:      Weight:   78.2 kg (172 lb 6.4 oz)   SpO2: 99% 98% 100% 97%    Intake/Output Summary (Last 24 hours) at 02/12/13 0953 Last data filed at 02/12/13 0845  Gross per 24 hour  Intake    843 ml  Output   1800 ml  Net   -957 ml   Filed Weights   02/10/13 0406 02/11/13 0445 02/12/13 0426  Weight: 77.973 kg (171 lb 14.4 oz) 77.9 kg (171 lb 11.8 oz) 78.2 kg (172 lb 6.4 oz)    Exam:  General: Alert, awake, oriented x3, in no acute distress.  HEENT: No bruits, no goiter.  Heart: Regular rate and rhythm, without murmurs, rubs, gallops.  Lungs: moderate air movement, no wheezing. Abdomen: Soft, nontender, nondistended, positive bowel sounds.    Data Reviewed: Basic Metabolic Panel:  Recent Labs Lab 02/08/13 1015  NA 135  K 3.5  CL 94*  CO2 31  GLUCOSE 145*  BUN 18  CREATININE 0.97  CALCIUM 9.9   Liver Function Tests: No results found for this basename: AST, ALT,  ALKPHOS, BILITOT, PROT, ALBUMIN,  in the last 168 hours No results found for this basename: LIPASE, AMYLASE,  in the last 168 hours No results found for this basename: AMMONIA,  in the last 168 hours CBC:  Recent Labs Lab 02/08/13 1015  WBC 11.9*  NEUTROABS 10.0*  HGB 12.4  HCT 36.7  MCV 92.0  PLT 162   Cardiac Enzymes:  Recent Labs Lab 02/08/13 1518 02/08/13 2023 02/09/13 0200  TROPONINI <0.30 <0.30 <0.30   BNP (last 3 results)  Recent Labs  02/13/12 1210  PROBNP 1239.0*   CBG: No results found for this basename: GLUCAP,  in the last 168 hours  No results found for this or any previous visit (from the past 240 hour(s)).   Studies: No results found.  Scheduled Meds: . albuterol  5 mg Nebulization Once  . aspirin EC  81 mg Oral Daily  . atorvastatin  40 mg Oral q1800  . bisoprolol  10 mg Oral Daily  . diltiazem  240 mg Oral Daily  . furosemide  40 mg Oral Daily  . guaiFENesin  600 mg Oral BID  . levofloxacin  500 mg Oral Daily  . losartan  50 mg Oral Daily  . pantoprazole  40 mg Oral Daily  . potassium chloride SA  20 mEq Oral BID  . predniSONE  50 mg Oral Q breakfast  .  senna-docusate  1 tablet Oral Daily  . sodium chloride  3 mL Intravenous Q12H  . tiotropium  18 mcg Inhalation Daily   Continuous Infusions:    Marinda Elk  [facility] Pager [phone]. If 8PM-8AM, please contact night-coverage at [email or web address], password [facility] 02/12/2013, 9:53 AM  LOS: 4 days

## 2013-02-13 NOTE — Progress Notes (Signed)
Pt.is A/Ox4 and is ambulatory with 1 person assistance to the [facility]. She has had no c/o pain and no signs of distress during the shift. She is an expected discharge to SNF today.

## 2013-02-13 NOTE — Progress Notes (Signed)
Patient's IV and telemetry has been discontinued, patient will be transported to [facility] via EMS, discharge instructions sent with patient.  Lorretta Harp RN

## 2013-02-14 NOTE — Clinical Social Work Psychosocial (Addendum)
    Clinical Social Work Department BRIEF PSYCHOSOCIAL ASSESSMENT 02/14/2013  Patient:  [name], [name]     Account Number:  [id number]     Admit date:  02/08/2013  Clinical Social Worker:  Tiburcio Pea  Date/Time:  02/10/2013 03:00 PM  Referred by:  Physician  Date Referred:  02/10/2013 Referred for  SNF Placement   Other Referral:   Interview type:  Patient Other interview type:    PSYCHOSOCIAL DATA Living Status:  ALONE Admitted from facility:   Level of care:   Primary support name:   Primary support relationship to patient:  FAMILY Degree of support available:   Strong support  Patient is completely independent and 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 Patient states that she had been managing well at home but currently needs short term SNF. "I'm weak and don't feel like I can go home alone." She has good family support but they cannot stay with her all the time. PT recommends short term SNF and patient agrees. She would like [facility] [address] if possible.  She has been there in the past.  Fl2 placed on chart-  SNF search in place.   Assessment/plan status:  Psychosocial Support/Ongoing Assessment of Needs Other assessment/ plan:   Information/referral to community resources:   SNF bed list provided but patient wants [facility]    PATIENT'S/FAMILY'S RESPONSE TO PLAN OF CARE: Patient is alert and oriented- very pleasant lady who is looking forward to short term SNF and then home.  She has a very positive attitude and wants to get well. Patient states that her family will help her once she gets home as needed and she will hope for home health if indicated. CSW will assist with placement and have notified.  [name]. [place], LCSWA  [phone]

## 2013-02-14 NOTE — Clinical Social Work Placement (Addendum)
    Clinical Social Work Department CLINICAL SOCIAL WORK PLACEMENT NOTE 02/14/2013  Patient:  MIHO, MONDA  Account Number:  [id number] Admit date:  02/08/2013  Clinical Social Worker:  Lupita Leash Labib Cwynar, LCSWA  Date/time:  02/10/2013 01:45 PM  Clinical Social Work is seeking post-discharge placement for this patient at the following level of care:   SKILLED NURSING   (*CSW will update this form in Epic as items are completed)   02/10/2013  Patient/family provided with [facility] Department of Clinical Social Work's list of facilities offering this level of care within the geographic area requested by the patient (or if unable, by the patient's family).  02/10/2013  Patient/family informed of their freedom to choose among providers that offer the needed level of care, that participate in Medicare, Medicaid or managed care program needed by the patient, have an available bed and are willing to accept the patient.  02/10/2013  Patient/family informed of [facility]' ownership interest in [facility], as well as of the fact that they are under no obligation to receive care at this facility.  PASARR submitted to EDS on 02/10/2013 PASARR number received from EDS on 02/10/2013  FL2 transmitted to all facilities in geographic area requested by pt/family on  02/11/2013 FL2 transmitted to all facilities within larger geographic area on   Patient informed that his/her managed care company has contracts with or will negotiate with  certain facilities, including the following:   [facility]- Medicare Complete     Patient/family informed of bed offers received:  02/13/2013 Patient chooses bed at [facility] [place] Physician recommends and patient chooses bed at    [facility] [place] on  02/13/2013 Patient to be transferred to facility by Ambulance  The following physician request were entered in Epic:   Additional  Comments: 02/13/13  Ok per MD for d/c today to [facility]. Patient is very pleased with d/c plan and states that she will sign herself into the facility. Notified SNf and patient's nurse who will call report. CSW signing off. Lorri Frederick. Zaharah Amir, LCSWA [phone]

## 2013-02-23 ENCOUNTER — Ambulatory Visit: Payer: Medicare Other | Admitting: Cardiovascular Disease

## 2013-03-02 ENCOUNTER — Other Ambulatory Visit: Payer: Self-pay | Admitting: Cardiology

## 2013-03-02 ENCOUNTER — Encounter: Payer: Self-pay | Admitting: Adult Health

## 2013-03-02 ENCOUNTER — Ambulatory Visit (INDEPENDENT_AMBULATORY_CARE_PROVIDER_SITE_OTHER): Payer: Medicare Other | Admitting: Adult Health

## 2013-03-02 VITALS — BP 108/60 | HR 52 | Temp 97.7°F | Ht 71.0 in | Wt 176.8 lb

## 2013-03-02 DIAGNOSIS — J449 Chronic obstructive pulmonary disease, unspecified: Secondary | ICD-10-CM

## 2013-03-02 NOTE — Patient Instructions (Addendum)
Continue on Symbicort and Spiriva .  Wear Oxygen on continuous flow setting at 2 l/m  At rest , 3 L/m with activity  We are very proud of you for quitting smoking.  May take extra Furosemide -"fluid pill " today .  Low salt diet .  Keep legs elevated as possible Please contact office for sooner follow up if symptoms do not improve or worsen or seek emergency care  follow up Dr. Vassie Loll  In 3-4 weeks as planned and As needed

## 2013-03-06 NOTE — Progress Notes (Signed)
  Subjective:    Patient ID: [name], female    DOB: 23-Aug-1929, 79 y.o.   MRN: [id number]  HPI   [name] , smoker presents for FU of COPD.  She has been on home O2 since dec 2010 ([facility]) after a chest cold requiring prednisone. She smoked 1/2 PPD, about 77 Pyrs - chantix made her sick & she is afraid of cardiac side effects with patches. She sees Dr Myrtis Ser for CAD & has tolerated lisinopril & metoprolol, echo 7/10 showed nml LV fn & non dilated RV. An episode of syncope in 2009 was attributed to NTG & diuretics.  She reports clear phlegm, nasal drip & sneezing - no seasonal allergies, nasal atrovent dries her out.  She has lt >> Rt pedal edema  PFTs 08/19/09 >> severe airway obstruction, FEV1 47%, no BD response, air trapping +, severe decrease in diffusion  CT angio 5/12 asc aortic aneurysm 41mm, hepatic cysts   Hosp 3/31- 08/04/11 for Newly recognized paroxysmal atrial fibrillation with RVR and COPD exacerbation  - not felt to be a coumadin candidate secondary to hx of GIB/AVMs   Admitted October 12 for COPD, exacerbation  Re- admitted October 17 -October 20/2013 for slow to improve COPD, exacerbation, and possible decompensated chronic diastolic congestive heart failure in the setting of poor oral intake  Discharged to [facility]. ACE inhibitor was changed  to losartan     10/25/12  4 month follow up  complains of SOB  ON last visit 06/23/2012  >> doxy / then levaquin, pred taper  Hosp adm 07/2012 for stent, 09/2012 for LLE cellulitis & bleeding -- follows with wound center ABI (demonstarted moderate Left lower extremity PAD)  LE dopplers (no DVT) - vascular appt pending Now in wheelchair, does not drive Still smoking from time to time.  Dyspnea is at baseline, no pedal edema Using pro-air 'a whole lot '  03/02/13 Follow up  4 mth f/u per Vassie Loll Complains of slightly worse dyspnea for 2 days , ? Ankles more swollen. Denies orthopnea or PND. No fever or discolored mucus.  Remains  on symbicort and spiriva .   Pt d/c ed from hosp and [facility] 02/22/13. Admitted 10/15 for AECOPD. Tx with iv abx and steroids. Transitioned over to Levaquin at discharge. Has finished levaquin . CXR showed chronic changes, no acute process  Has quit smoking since discharge.    Review of Systems  neg for any significant sore throat, dysphagia, itching, sneezing, nasal congestion or excess/ purulent secretions, fever, chills, sweats, unintended wt loss, pleuritic or exertional cp, hempoptysis, orthopnea pnd or change in chronic leg swelling. Also denies presyncope, palpitations, heartburn, abdominal pain, nausea, vomiting, diarrhea or change in bowel or urinary habits, dysuria,hematuria, rash, arthralgias, visual complaints, headache, numbness weakness or ataxia.     Objective:   Physical Exam   Gen. Pleasant, well-nourished, in no distress ENT - no lesions, no post nasal drip Neck: No JVD, no thyromegaly, no carotid bruits Lungs: no use of accessory muscles, no dullness to percussion, decreased without rales or rhonchi  Cardiovascular: Rhythm regular, heart sounds  normal, no murmurs or gallops, none to tr  peripheral edema Musculoskeletal: No deformities, no cyanosis or clubbing        Assessment & Plan:

## 2013-03-07 NOTE — Assessment & Plan Note (Addendum)
Recent exacerbation now resolving  Flu shot utd ?mild fluid retention -exam not impressive   Plan  Continue on Symbicort and Spiriva .  Wear Oxygen on continuous flow setting at 2 l/m  At rest , 3 L/m with activity  We are very proud of you for quitting smoking.  May take extra Furosemide -"fluid pill " today .  Low salt diet .  Keep legs elevated as possible Please contact office for sooner follow up if symptoms do not improve or worsen or seek emergency care  follow up Dr. Vassie Loll  In 3-4 weeks as planned and As needed

## 2013-03-17 ENCOUNTER — Other Ambulatory Visit: Payer: Self-pay | Admitting: Cardiology

## 2013-04-04 ENCOUNTER — Telehealth: Payer: Self-pay | Admitting: Pulmonary Disease

## 2013-04-04 DIAGNOSIS — J449 Chronic obstructive pulmonary disease, unspecified: Secondary | ICD-10-CM

## 2013-04-04 NOTE — Telephone Encounter (Signed)
Called and made pt aware order will be sent to [facility]. Nothing further needed

## 2013-04-28 ENCOUNTER — Encounter (HOSPITAL_COMMUNITY): Payer: Self-pay | Admitting: Emergency Medicine

## 2013-04-28 ENCOUNTER — Emergency Department (HOSPITAL_COMMUNITY): Payer: Medicare Other

## 2013-04-28 ENCOUNTER — Emergency Department (HOSPITAL_COMMUNITY)
Admission: EM | Admit: 2013-04-28 | Discharge: 2013-04-28 | Disposition: A | Discharge status: Home / Self Care | Payer: Medicare Other | Attending: Emergency Medicine | Admitting: Emergency Medicine

## 2013-04-28 DIAGNOSIS — Z8611 Personal history of tuberculosis: Secondary | ICD-10-CM | POA: Insufficient documentation

## 2013-04-28 DIAGNOSIS — Z951 Presence of aortocoronary bypass graft: Secondary | ICD-10-CM | POA: Insufficient documentation

## 2013-04-28 DIAGNOSIS — E785 Hyperlipidemia, unspecified: Secondary | ICD-10-CM | POA: Insufficient documentation

## 2013-04-28 DIAGNOSIS — J441 Chronic obstructive pulmonary disease with (acute) exacerbation: Secondary | ICD-10-CM | POA: Insufficient documentation

## 2013-04-28 DIAGNOSIS — I4891 Unspecified atrial fibrillation: Secondary | ICD-10-CM | POA: Insufficient documentation

## 2013-04-28 DIAGNOSIS — Z9861 Coronary angioplasty status: Secondary | ICD-10-CM | POA: Insufficient documentation

## 2013-04-28 DIAGNOSIS — Z872 Personal history of diseases of the skin and subcutaneous tissue: Secondary | ICD-10-CM | POA: Insufficient documentation

## 2013-04-28 DIAGNOSIS — Z7982 Long term (current) use of aspirin: Secondary | ICD-10-CM | POA: Insufficient documentation

## 2013-04-28 DIAGNOSIS — I10 Essential (primary) hypertension: Secondary | ICD-10-CM | POA: Insufficient documentation

## 2013-04-28 DIAGNOSIS — Z9089 Acquired absence of other organs: Secondary | ICD-10-CM | POA: Insufficient documentation

## 2013-04-28 DIAGNOSIS — Z79899 Other long term (current) drug therapy: Secondary | ICD-10-CM | POA: Insufficient documentation

## 2013-04-28 DIAGNOSIS — Z9981 Dependence on supplemental oxygen: Secondary | ICD-10-CM | POA: Insufficient documentation

## 2013-04-28 DIAGNOSIS — Z880 Allergy status to penicillin: Secondary | ICD-10-CM | POA: Insufficient documentation

## 2013-04-28 DIAGNOSIS — Z87891 Personal history of nicotine dependence: Secondary | ICD-10-CM | POA: Insufficient documentation

## 2013-04-28 DIAGNOSIS — I2510 Atherosclerotic heart disease of native coronary artery without angina pectoris: Secondary | ICD-10-CM | POA: Insufficient documentation

## 2013-04-28 LAB — CBC
HCT: 34.5 % — ABNORMAL LOW (ref 36.0–46.0)
Hemoglobin: 11.3 g/dL — ABNORMAL LOW (ref 12.0–15.0)
MCH: 30.1 pg (ref 26.0–34.0)
MCHC: 32.8 g/dL (ref 30.0–36.0)
MCV: 91.8 fL (ref 78.0–100.0)
Platelets: 208 10*3/uL (ref 150–400)
RBC: 3.76 MIL/uL — AB (ref 3.87–5.11)
RDW: 13.2 % (ref 11.5–15.5)
WBC: 12.2 10*3/uL — ABNORMAL HIGH (ref 4.0–10.5)

## 2013-04-28 LAB — BASIC METABOLIC PANEL
BUN: 17 mg/dL (ref 6–23)
CALCIUM: 10 mg/dL (ref 8.4–10.5)
CO2: 31 meq/L (ref 19–32)
CREATININE: 1.2 mg/dL — AB (ref 0.50–1.10)
Chloride: 93 mEq/L — ABNORMAL LOW (ref 96–112)
GFR calc Af Amer: 48 mL/min — ABNORMAL LOW (ref >90)
GFR calc non Af Amer: 41 mL/min — ABNORMAL LOW (ref >90)
GLUCOSE: 145 mg/dL — AB (ref 70–99)
Potassium: 3.6 mEq/L — ABNORMAL LOW (ref 3.7–5.3)
Sodium: 135 mEq/L — ABNORMAL LOW (ref 137–147)

## 2013-04-28 LAB — POCT I-STAT TROPONIN I: Troponin i, poc: 0.01 ng/mL (ref 0.00–0.08)

## 2013-04-28 LAB — PRO B NATRIURETIC PEPTIDE: Pro B Natriuretic peptide (BNP): 458 pg/mL — ABNORMAL HIGH (ref 0–450)

## 2013-04-28 MED ORDER — ALBUTEROL SULFATE (2.5 MG/3ML) 0.083% IN NEBU
5.0000 mg | INHALATION_SOLUTION | Freq: Once | RESPIRATORY_TRACT | Status: AC
  Administered 2013-04-28: 5 mg via RESPIRATORY_TRACT
  Filled 2013-04-28: qty 6

## 2013-04-28 MED ORDER — SODIUM CHLORIDE 0.9 % IV BOLUS (SEPSIS)
500.0000 mL | Freq: Once | INTRAVENOUS | Status: AC
Start: 2013-04-28 — End: 2013-04-28
  Administered 2013-04-28: 500 mL via INTRAVENOUS

## 2013-04-28 MED ORDER — AZITHROMYCIN 250 MG PO TABS: 250.0000 mg | ORAL_TABLET | Freq: Every day | ORAL | Status: AC

## 2013-04-28 MED ORDER — PREDNISONE 10 MG PO TABS: 20.0000 mg | ORAL_TABLET | Freq: Every day | ORAL | Status: AC

## 2013-04-28 MED ORDER — ALBUTEROL SULFATE (2.5 MG/3ML) 0.083% IN NEBU
5.0000 mg | INHALATION_SOLUTION | Freq: Once | RESPIRATORY_TRACT | Status: AC
Start: 2013-04-28 — End: 2013-04-28
  Administered 2013-04-28: 5 mg via RESPIRATORY_TRACT
  Filled 2013-04-28: qty 6

## 2013-04-28 MED ORDER — METHYLPREDNISOLONE SODIUM SUCC 125 MG IJ SOLR
125.0000 mg | INTRAMUSCULAR | Status: AC
  Administered 2013-04-28: 125 mg via INTRAVENOUS
  Filled 2013-04-28: qty 2

## 2013-04-28 MED ORDER — AZITHROMYCIN 250 MG PO TABS
500.0000 mg | ORAL_TABLET | Freq: Once | ORAL | Status: AC
  Administered 2013-04-28: 500 mg via ORAL
  Filled 2013-04-28: qty 2

## 2013-04-28 NOTE — Discharge Instructions (Signed)
As discussed, it is very important that you followup with your physicians to ensure appropriate ongoing care.  Return here for concerning changes her condition.  For the next few days, please use your albuterol inhaler every 4 hours. 1

## 2013-04-28 NOTE — ED Notes (Signed)
Pt states her breathing has gotten worse over the past 2 weeks.  Went to Dr. Jeannetta NapElkins office, got 5 day dose of azithromax.  Once she stopped antibiotics, symptoms returned.  Patient has some occasional pain with coughing.

## 2013-04-28 NOTE — ED Notes (Signed)
Dr.Lockwood at bedside  

## 2013-04-28 NOTE — ED Provider Notes (Signed)
CSN: [id number]     Arrival date & time 04/28/13  1807 History   First MD Initiated Contact with Patient 04/28/13 1811     Chief Complaint  Patient presents with  . Shortness of Breath   (Consider location/radiation/quality/duration/timing/severity/associated sxs/prior Treatment) HPI Patient presents with dyspnea. Symptoms began as several days without clear precipitant. Patient actually had URI like symptoms one week ago, completed a course of azithromycin therapy, just prior to the worsening of this episode. Since onset there has been minimal pain, aside from coughing. There is no associated fever, lightheadedness, syncope, falling. Patient notes no recent steroid use.  Past Medical History  Diagnosis Date  . CAD (coronary artery disease)     a. s/p CABG x5 (2005) b. s/p BMS-prox SVG-D2 (2014)  . Hypertension   . Hyperlipidemia   . GI bleed     AVMs  . Aneurysm, aortic     thoracic aorta, stable at 4.1 cm, chest CT, May, 2012  . Syncope     Nitroglycerin plus a diuretic, April, 2009  . Hyponatremia     Chronic. Felt secondary to SIADH   . COPD (chronic obstructive pulmonary disease)   . Tobacco abuse   . Bradycardia   . Carotid artery disease     Hx of endarterectomy. Doppler October, 2011, stable, 0-39% RIC A., 40-59% LICA  . Tuberculosis     Exposures to tuberculosis 1970s, has tested negative by the health Department  . Atrial fibrillation     Paroxysmal with RVR 07/2011, not felt to be a coumadin candidate secondary to hx of GIB/AVM  . Venous stasis of lower extremity     Chronic  . Ejection fraction     a. EF 55-60%, echo, April, 2013 b. EF 55-60%, mild biatrial enlargement and PASP 42 mmH  . CHF with left ventricular diastolic dysfunction, NYHA class 2   . AAA (abdominal aortic aneurysm)   . On home O2     2L N/C  . Complication of anesthesia   . Shortness of breath   . Hx of CABG     CABG 2005  . Hematoma     Right groin hematoma, small, post cath, April,  2014  . Mitral regurgitation     Mild, hospital, March, 2014  . Leg ulcer     Ulcerated lesion on the anterior aspect of her left lower leg, June, 2014   Past Surgical History  Procedure Laterality Date  . Cholecystectomy    . Carotid endartercetomy    . Abdominal hysterectomy    . Coronary artery bypass graft  2005  . Coronary angioplasty with stent placement  07/18/12    severe native coronary artery disease, 99% proximal stenosis of the SVG-D2 status post successful PTCA/PCI with a Veriflex bare-metal stent, widely patent SVGs to both the right PDA sequential OM1 to OM 2, EF 65-70% and 3+ mitral regurgitation   Family History  Problem Relation Age of Onset  . Stroke Sister   . Heart failure Mother   . Cancer Sister     Breast and Lung   . Cancer Brother     breast cancer   History  Substance Use Topics  . Smoking status: Former Smoker -- 0.50 packs/day for 59 years    Types: Cigarettes    Quit date: 02/01/2013  . Smokeless tobacco: Not on file  . Alcohol Use: No   OB History   Grav Para Term Preterm Abortions TAB SAB Ect Mult Living  Review of Systems  Constitutional:       Per HPI, otherwise negative  HENT:       Per HPI, otherwise negative  Respiratory:       Per HPI, otherwise negative  Cardiovascular:       Per HPI, otherwise negative  Gastrointestinal: Negative for vomiting.  Endocrine:       Negative aside from HPI  Genitourinary:       Neg aside from HPI   Musculoskeletal:       Per HPI, otherwise negative  Skin: Negative.   Neurological: Negative for syncope.    Allergies  Codeine; Ranitidine hcl; Sulfonamide derivatives; and Penicillins  Home Medications   Current Outpatient Rx  Name  Route  Sig  Dispense  Refill  . albuterol (PROAIR HFA) 108 (90 BASE) MCG/ACT inhaler   Inhalation   Inhale 2 puffs into the lungs every 6 (six) hours as needed for wheezing.   3 Inhaler   3   . albuterol (PROVENTIL) (2.5 MG/3ML) 0.083%  nebulizer solution   Nebulization   Take 2.5 mg by nebulization every 6 (six) hours as needed for wheezing.         Marland Kitchen. aspirin EC 81 MG tablet   Oral   Take 81 mg by mouth daily.         Marland Kitchen. atorvastatin (LIPITOR) 40 MG tablet   Oral   Take 40 mg by mouth at bedtime.         . bisoprolol (ZEBETA) 10 MG tablet   Oral   Take 10 mg by mouth daily.         . bisoprolol (ZEBETA) 10 MG tablet      TAKE 1 TABLET DAILY   90 tablet   1   . budesonide-formoterol (SYMBICORT) 160-4.5 MCG/ACT inhaler   Inhalation   Inhale 2 puffs into the lungs 2 (two) times daily.         Marland Kitchen. diltiazem (CARDIZEM CD) 240 MG 24 hr capsule   Oral   Take 240 mg by mouth daily.         . furosemide (LASIX) 40 MG tablet   Oral   Take 1 tablet (40 mg total) by mouth daily.   90 tablet   1   . hydroxypropyl methylcellulose (ISOPTO TEARS) 2.5 % ophthalmic solution   Both Eyes   Place 1 drop into both eyes daily as needed (for dry eyes).         . IPRATROPIUM BROMIDE NA   Nasal   Place 2 sprays into the nose daily as needed (for stuffiness/wheezing).          Marland Kitchen. losartan (COZAAR) 50 MG tablet   Oral   Take 50 mg by mouth daily.         . Multiple Vitamins-Minerals (PRESERVISION AREDS 2 PO)   Oral   Take 1 tablet by mouth 2 (two) times daily.         . nitroGLYCERIN (NITROSTAT) 0.4 MG SL tablet   Sublingual   Place 0.4 mg under the tongue every 5 (five) minutes as needed for chest pain.         . pantoprazole (PROTONIX) 40 MG tablet   Oral   Take 40 mg by mouth daily.         . polyethylene glycol (MIRALAX / GLYCOLAX) packet   Oral   Take 17 g by mouth daily as needed.         . potassium chloride  SA (K-DUR,KLOR-CON) 20 MEQ tablet   Oral   Take 20 mEq by mouth 2 (two) times daily.         Marland Kitchen senna-docusate (SENOKOT-S) 8.6-50 MG per tablet   Oral   Take 1 tablet by mouth daily.         Marland Kitchen tiotropium (SPIRIVA) 18 MCG inhalation capsule   Inhalation   Place 18 mcg  into inhaler and inhale daily.          . Vitamin D, Ergocalciferol, (DRISDOL) 50000 UNITS CAPS   Oral   Take 50,000 Units by mouth every 14 (fourteen) days. Every other Sunday          BP 106/33  Pulse 64  Temp(Src) 98 F (36.7 C) (Oral)  Resp 19  Ht 5\' 11"  (1.803 m)  Wt 180 lb (81.647 kg)  BMI 25.12 kg/m2  SpO2 99% Physical Exam  Nursing note and vitals reviewed. Constitutional: She is oriented to person, place, and time. She appears ill.  HENT:  Head: Normocephalic and atraumatic.  Eyes: Conjunctivae and EOM are normal.  Cardiovascular: Normal rate and regular rhythm.   Pulmonary/Chest: Effort normal. No stridor. No respiratory distress. She has wheezes in the right upper field, the right middle field, the right lower field, the left upper field, the left middle field and the left lower field.  Abdominal: She exhibits no distension.  Musculoskeletal: She exhibits no edema.  Neurological: She is alert and oriented to person, place, and time. No cranial nerve deficit.  Skin: Skin is warm and dry.  Psychiatric: She has a normal mood and affect.    ED Course  Procedures (including critical care time) Labs Review Labs Reviewed  BASIC METABOLIC PANEL  CBC  PRO B NATRIURETIC PEPTIDE   Imaging Review No results found.  EKG Interpretation    Date/Time:  Friday April 28 2013 18:11:25 EST Ventricular Rate:  70 PR Interval:  174 QRS Duration: 87 QT Interval:  393 QTC Calculation: 424 R Axis:   30 Text Interpretation:  Sinus rhythm Abnormal R-wave progression, early transition Abnormal T, consider ischemia, lateral leads Sinus rhythm T wave abnormality Abnormal ekg Confirmed by Gerhard Munch  MD ([id number]) on 04/28/2013 7:01:51 PM           O2- 97% 2l Anderson, abnormal  I reviewed the EMR  11:03 PM Patient appears calm, sitting upright, eating a sandwich.  On repeat auscultation the patient's lung sounds appear improved MDM   1. COPD exacerbation    This  patient presents with ongoing respiratory complaints.  On exam she is awake, alert, afebrile, Humibid stable.  Patient does have coarse breath sounds.  With her history of COPD, CHF, differential considerations.  Patient presents initially here, was appropriate for discharge with outpatient pulmonology followup.    Gerhard Munch, MD 04/28/13 [phone]

## 2013-04-28 NOTE — ED Notes (Addendum)
Pts bp 101/30, Jeraldine LootsLockwood MD made aware

## 2013-04-28 NOTE — ED Notes (Addendum)
Pt present increasing sob over 3 days, worse with exertion, congested cough.  Just finished azithromax 3 days ago for sinus infection per family nurse.  Started feeling bad when abx ran out.  Patient took 1000 mg apap before EMS arrival, expiratory wheezes per EMS, 5mg  albuterol given PTA

## 2013-05-01 LAB — POCT I-STAT TROPONIN I: TROPONIN I, POC: 0 ng/mL (ref 0.00–0.08)

## 2013-05-02 ENCOUNTER — Telehealth: Payer: Self-pay | Admitting: Pulmonary Disease

## 2013-05-02 NOTE — Telephone Encounter (Signed)
Called and spoke with pt and she is aware [facility] to [facility] her symptoms and she can be treated tomorrow.  Pt stated that she has abx for today.  Pt voiced her understanding.

## 2013-05-03 ENCOUNTER — Ambulatory Visit (INDEPENDENT_AMBULATORY_CARE_PROVIDER_SITE_OTHER): Payer: Medicare Other | Admitting: Pulmonary Disease

## 2013-05-03 ENCOUNTER — Encounter: Payer: Self-pay | Admitting: Pulmonary Disease

## 2013-05-03 VITALS — BP 128/58 | HR 67 | Temp 98.0°F | Ht 71.0 in | Wt 179.6 lb

## 2013-05-03 DIAGNOSIS — J4489 Other specified chronic obstructive pulmonary disease: Secondary | ICD-10-CM

## 2013-05-03 DIAGNOSIS — J449 Chronic obstructive pulmonary disease, unspecified: Secondary | ICD-10-CM

## 2013-05-03 DIAGNOSIS — J9610 Chronic respiratory failure, unspecified whether with hypoxia or hypercapnia: Secondary | ICD-10-CM

## 2013-05-03 DIAGNOSIS — J441 Chronic obstructive pulmonary disease with (acute) exacerbation: Secondary | ICD-10-CM

## 2013-05-03 MED ORDER — LEVOFLOXACIN 750 MG PO TABS: 750.0000 mg | ORAL_TABLET | Freq: Every day | ORAL | Status: DC

## 2013-05-03 MED ORDER — ALBUTEROL SULFATE HFA 108 (90 BASE) MCG/ACT IN AERS: 2.0000 | INHALATION_SPRAY | Freq: Four times a day (QID) | RESPIRATORY_TRACT | Status: DC | PRN

## 2013-05-03 MED ORDER — PREDNISONE 10 MG PO TABS: ORAL_TABLET | ORAL | Status: DC

## 2013-05-03 NOTE — Assessment & Plan Note (Signed)
The patient most likely has a partially treated COPD exacerbation. I suspect that a Z-Pak is an adequate treatment for her, and we'll treat her with a more broad-spectrum antibiotic such as Levaquin. Will also treat her with another prednisone taper given her frailty and advanced lung disease. The patient is to call if she does not see improvement.

## 2013-05-03 NOTE — Patient Instructions (Signed)
Will treat you with levaquin 750mg  one a day for 5 days. Stop your current prednisone dosing, and instead take an 8 day taper that we send in for you. Use mucinex dm one in am and pm for next one week to help with mucus clearance.  Keep followup apptm with Dr. Vassie LollAlva.

## 2013-05-03 NOTE — Progress Notes (Signed)
   Subjective:    Patient ID: [name], female    DOB: 04/04/1930, 80 y.o.   MRN: [id number]  HPI The patient comes in today for an acute sick visit. She has known COPD, and was seen in the emergency room the second day of January with what sounds like acute bronchitis and a COPD exacerbation. She was treated with a Z-Pak as well as prednisone taper, but the patient feels that she continues to be very congested and is still coughing up purulent mucus. Her breathing is a little better since being on the prednisone, but is nowhere near her usual baseline. She did have an x-ray in the emergency room that did not show an infiltrate. She has not had any fevers, chills, or sweats   Review of Systems  Constitutional: Negative for fever and unexpected weight change.  HENT: Positive for congestion. Negative for dental problem, ear pain, nosebleeds, postnasal drip, rhinorrhea, sinus pressure, sneezing, sore throat and trouble swallowing.   Eyes: Negative for redness and itching.  Respiratory: Positive for cough, chest tightness ( with cough) and shortness of breath. Negative for wheezing.        Green mucus production  Cardiovascular: Positive for chest pain ( with cough). Negative for palpitations and leg swelling.  Gastrointestinal: Negative for nausea and vomiting.  Genitourinary: Negative for dysuria.  Musculoskeletal: Negative for joint swelling.  Skin: Negative for rash.  Neurological: Negative for headaches.  Hematological: Does not bruise/bleed easily.  Psychiatric/Behavioral: Negative for dysphoric mood. The patient is not nervous/anxious.        Objective:   Physical Exam Overweight female in no acute distress Nose without purulence or discharge noted Oropharynx clear Neck without lymphadenopathy or thyromegaly Chest with decreased breath sounds a few rhonchi, but no active wheezing Cardiac exam with regular rate and rhythm Lower extremities with 1+ edema, and varicosities, no  cyanosis Alert and oriented, moves all 4 extremities.       Assessment & Plan:

## 2013-06-09 ENCOUNTER — Ambulatory Visit: Payer: Medicare Other | Admitting: Pulmonary Disease

## 2013-06-09 ENCOUNTER — Ambulatory Visit (INDEPENDENT_AMBULATORY_CARE_PROVIDER_SITE_OTHER): Payer: Medicare Other | Admitting: Pulmonary Disease

## 2013-06-09 ENCOUNTER — Encounter: Payer: Self-pay | Admitting: Pulmonary Disease

## 2013-06-09 VITALS — BP 122/66 | HR 68 | Temp 97.7°F | Wt 174.6 lb

## 2013-06-09 DIAGNOSIS — J441 Chronic obstructive pulmonary disease with (acute) exacerbation: Secondary | ICD-10-CM

## 2013-06-09 MED ORDER — PREDNISONE 10 MG PO TABS: ORAL_TABLET | ORAL | Status: DC

## 2013-06-09 MED ORDER — GUAIFENESIN-CODEINE 100-10 MG/5ML PO SYRP: 5.0000 mL | ORAL_SOLUTION | Freq: Two times a day (BID) | ORAL | Status: DC

## 2013-06-09 MED ORDER — HYDROCODONE-HOMATROPINE 5-1.5 MG/5ML PO SYRP: 5.0000 mL | ORAL_SOLUTION | Freq: Two times a day (BID) | ORAL | Status: DC | PRN

## 2013-06-09 NOTE — Patient Instructions (Signed)
Stay on advair & spiriva Prednisone 10 mg tabs  Take 2 tabs daily with food x 7ds, then 1 tab daily with food x 7ds then STOP Cough syrup

## 2013-06-09 NOTE — Progress Notes (Signed)
   Subjective:    Patient ID: Karen MunroeEtta J Evett, female    DOB: Aug 12, 1929, 80 y.o.   MRN: [id number]  HPI  [name] , smoker presents for FU of gold D COPD, 2-3 admits/ yr.  She has been on home O2 since dec 2010 ([facility]). She smoked 1/2 PPD, about 4440 Pyrs - chantix made her sick & she is afraid of cardiac side effects with patches. She sees Dr Myrtis SerKatz for CAD & has tolerated lisinopril & metoprolol, echo 7/10 showed nml LV fn & non dilated RV. An episode of syncope in 2009 was attributed to NTG & diuretics.  PFTs 08/19/09 >> severe airway obstruction, FEV1 47%, no BD response, air trapping +, severe decrease in diffusion  CT angio 5/12 asc aortic aneurysm 41mm, hepatic cysts  Hosp 06/2011 for New paroxysmal atrial fibrillation with RVR and COPD exacerbation  - not felt to be a coumadin candidate secondary to hx of GIB/AVMs   Hosp adm 07/2012 for stent, 09/2012 for LLE cellulitis & bleeding -- follows with wound center  ABI (demonstarted moderate Left lower extremity PAD)  LE dopplers (no DVT)   Pt d/c ed from hosp and [facility] 02/22/13.  Admitted 01/2014 for AECOPD  New neb Rx sent to [facility] 03/2013 04/2013 >> levaquin/ pred Remains on symbicort and spiriva .   Chief Complaint  Patient presents with  . Follow-up    Breathing is worse, prod cough at times w/ clear phlem, occas chest tx when tryig to get phlem up.    Cough seems to  Be the main issue  Review of Systems neg for any significant sore throat, dysphagia, itching, sneezing, nasal congestion or excess/ purulent secretions, fever, chills, sweats, unintended wt loss, pleuritic or exertional cp, hempoptysis, orthopnea pnd or change in chronic leg swelling. Also denies presyncope, palpitations, heartburn, abdominal pain, nausea, vomiting, diarrhea or change in bowel or urinary habits, dysuria,hematuria, rash, arthralgias, visual complaints, headache, numbness weakness or ataxia.     Objective:   Physical Exam  Gen. Pleasant,  well-nourished, in no distress ENT - no lesions, no post nasal drip Neck: No JVD, no thyromegaly, no carotid bruits Lungs: no use of accessory muscles, no dullness to percussion, clear without rales or rhonchi  Cardiovascular: Rhythm regular, heart sounds  normal, no murmurs or gallops, no peripheral edema Musculoskeletal: No deformities, no cyanosis or clubbing         Assessment & Plan:

## 2013-06-12 NOTE — Assessment & Plan Note (Signed)
Stay on advair & spiriva Prednisone 10 mg tabs  Take 2 tabs daily with food x 7ds, then 1 tab daily with food x 7ds then STOP Cough syrup - codeine makes her sick, will give hycodan for symptom relief

## 2013-06-15 ENCOUNTER — Other Ambulatory Visit: Payer: Self-pay | Admitting: Cardiology

## 2013-06-19 ENCOUNTER — Other Ambulatory Visit: Payer: Self-pay | Admitting: Cardiology

## 2013-06-19 ENCOUNTER — Other Ambulatory Visit: Payer: Self-pay | Admitting: Pulmonary Disease

## 2013-06-19 NOTE — Telephone Encounter (Signed)
Luis AbedJeffrey D Katz, MD at 12/29/2012 3:15 PM furosemide (LASIX) 40 MG tablet  Take 1 tablet (40 mg total) by mouth daily.   Patient Instructions  Your physician recommends that you continue on your current medications as directed. Please refer to the Current Medication list given to you today.

## 2013-07-01 ENCOUNTER — Other Ambulatory Visit: Payer: Self-pay | Admitting: Cardiology

## 2013-07-03 ENCOUNTER — Telehealth: Payer: Self-pay | Admitting: Pulmonary Disease

## 2013-07-03 MED ORDER — ALBUTEROL SULFATE HFA 108 (90 BASE) MCG/ACT IN AERS: INHALATION_SPRAY | RESPIRATORY_TRACT | Status: DC

## 2013-07-03 NOTE — Telephone Encounter (Signed)
Called and spoke with pt. Aware RX has been sent. Nothing further needed 

## 2013-07-06 ENCOUNTER — Ambulatory Visit: Payer: Medicare Other | Admitting: Adult Health

## 2013-07-13 ENCOUNTER — Ambulatory Visit (INDEPENDENT_AMBULATORY_CARE_PROVIDER_SITE_OTHER): Payer: Medicare Other | Admitting: Adult Health

## 2013-07-13 ENCOUNTER — Encounter: Payer: Self-pay | Admitting: Adult Health

## 2013-07-13 VITALS — BP 124/64 | HR 58 | Temp 97.9°F | Wt 174.9 lb

## 2013-07-13 DIAGNOSIS — J449 Chronic obstructive pulmonary disease, unspecified: Secondary | ICD-10-CM

## 2013-07-13 NOTE — Patient Instructions (Signed)
Continue on Symbicort and Spiriva .  Use Albuterol As needed  Wheezing/shortness of breath.  Wear Oxygen on continuous flow setting at 2 l/m  At rest , 3 L/m with activity  Please contact office for sooner follow up if symptoms do not improve or worsen or seek emergency care  Follow up Dr. Vassie LollAlva  In 2 months  and As needed

## 2013-07-13 NOTE — Progress Notes (Signed)
   Subjective:    Patient ID: [name], female    DOB: 11/21/1929, 80 y.o.   MRN: [id number]  HPI   [name] , smoker presents for FU of gold D COPD, 2-3 admits/ yr.  She has been on home O2 since dec 2010 ([facility]). She smoked 1/2 PPD, about 2640 Pyrs - chantix made her sick & she is afraid of cardiac side effects with patches. She sees Dr [name] for CAD & has tolerated lisinopril & metoprolol, echo 7/10 showed nml LV fn & non dilated RV. An episode of syncope in 2009 was attributed to NTG & diuretics.  PFTs 08/19/09 >> severe airway obstruction, FEV1 47%, no BD response, air trapping +, severe decrease in diffusion  CT angio 5/12 asc aortic aneurysm 41mm, hepatic cysts  Hosp 06/2011 for New paroxysmal atrial fibrillation with RVR and COPD exacerbation  - not felt to be a coumadin candidate secondary to hx of GIB/AVMs   Hosp adm 07/2012 for stent, 09/2012 for LLE cellulitis & bleeding -- follows with wound center  ABI (demonstarted moderate Left lower extremity PAD)  LE dopplers (no DVT)   Pt d/c ed from hosp and [facility] 02/22/13.  Admitted 01/2014 for AECOPD  New neb Rx sent to [facility] 03/2013 04/2013 >> levaquin/ pred Remains on symbicort and spiriva .   Chief Complaint  Patient presents with  . Follow-up    RA pt here for 1 month COPD follow up - reports breathing is unchanged since last visit   Cough seems to  Be the main issue  07/13/2013 Follow up  Pt returns for follow up .  Seen last month for COPD flare , tx w/ pred taper.  Reports breathing is unchanged since last visit Walks short distances , gives out of energy and breath.  No increased cough or wheezing no fever.  No exertional chest pain.  Appetite is fair.  Remains on spiriva and symbicort Uses albuterol several times a day, discussed decreased use . Advised on pursed lip breathing and tripod sitting.  No hemoptysis, chest pain or orthopnea.     Review of Systems  neg for any significant sore throat,  dysphagia, itching, sneezing, nasal congestion or excess/ purulent secretions, fever, chills, sweats, unintended wt loss, pleuritic or exertional cp, hempoptysis, orthopnea pnd or change in chronic leg swelling. Also denies presyncope, palpitations, heartburn, abdominal pain, nausea, vomiting, diarrhea or change in bowel or urinary habits, dysuria,hematuria, rash, arthralgias, visual complaints, headache, numbness weakness or ataxia.     Objective:   Physical Exam   Gen. Pleasant, elderly in WC , in no distress ENT - no lesions, no post nasal drip Neck: No JVD, no thyromegaly, no carotid bruits Lungs: no use of accessory muscles, no dullness to percussion, diminished BS in bases  Cardiovascular: Rhythm regular, heart sounds  normal, no murmurs or gallops, tr  peripheral edema Musculoskeletal: No deformities, no cyanosis or clubbing         Assessment & Plan:

## 2013-07-13 NOTE — Assessment & Plan Note (Signed)
Compensated on present regimen   Plan  Continue on Symbicort and Spiriva .  Use Albuterol As needed  Wheezing/shortness of breath.  Wear Oxygen on continuous flow setting at 2 l/m  At rest , 3 L/m with activity  Please contact office for sooner follow up if symptoms do not improve or worsen or seek emergency care  Follow up Dr. Vassie LollAlva  In 2 months  and As needed

## 2013-07-17 NOTE — Progress Notes (Signed)
Reviewed & agree with plan  

## 2013-07-31 ENCOUNTER — Other Ambulatory Visit: Payer: Self-pay | Admitting: Cardiology

## 2013-08-04 ENCOUNTER — Ambulatory Visit (INDEPENDENT_AMBULATORY_CARE_PROVIDER_SITE_OTHER): Payer: Medicare Other | Admitting: Cardiology

## 2013-08-04 ENCOUNTER — Other Ambulatory Visit: Payer: Self-pay

## 2013-08-04 ENCOUNTER — Encounter: Payer: Self-pay | Admitting: Cardiology

## 2013-08-04 VITALS — BP 145/63 | HR 63 | Ht 71.5 in | Wt 181.0 lb

## 2013-08-04 DIAGNOSIS — D649 Anemia, unspecified: Secondary | ICD-10-CM

## 2013-08-04 DIAGNOSIS — J449 Chronic obstructive pulmonary disease, unspecified: Secondary | ICD-10-CM

## 2013-08-04 DIAGNOSIS — I5032 Chronic diastolic (congestive) heart failure: Secondary | ICD-10-CM

## 2013-08-04 DIAGNOSIS — J4489 Other specified chronic obstructive pulmonary disease: Secondary | ICD-10-CM

## 2013-08-04 DIAGNOSIS — I509 Heart failure, unspecified: Secondary | ICD-10-CM

## 2013-08-04 DIAGNOSIS — I10 Essential (primary) hypertension: Secondary | ICD-10-CM

## 2013-08-04 LAB — CBC WITH DIFFERENTIAL/PLATELET
BASOS ABS: 0 10*3/uL (ref 0.0–0.1)
Basophils Relative: 0.4 % (ref 0.0–3.0)
Eosinophils Absolute: 0.5 10*3/uL (ref 0.0–0.7)
Eosinophils Relative: 4.7 % (ref 0.0–5.0)
HEMATOCRIT: 33.4 % — AB (ref 36.0–46.0)
Hemoglobin: 11 g/dL — ABNORMAL LOW (ref 12.0–15.0)
Lymphocytes Relative: 9.8 % — ABNORMAL LOW (ref 12.0–46.0)
Lymphs Abs: 1.1 10*3/uL (ref 0.7–4.0)
MCHC: 32.9 g/dL (ref 30.0–36.0)
MCV: 86.2 fl (ref 78.0–100.0)
Monocytes Absolute: 0.8 10*3/uL (ref 0.1–1.0)
Monocytes Relative: 7.3 % (ref 3.0–12.0)
NEUTROS ABS: 8.6 10*3/uL — AB (ref 1.4–7.7)
Neutrophils Relative %: 77.8 % — ABNORMAL HIGH (ref 43.0–77.0)
PLATELETS: 263 10*3/uL (ref 150.0–400.0)
RBC: 3.88 Mil/uL (ref 3.87–5.11)
RDW: 15.4 % — ABNORMAL HIGH (ref 11.5–14.6)
WBC: 11.1 10*3/uL — ABNORMAL HIGH (ref 4.5–10.5)

## 2013-08-04 LAB — BASIC METABOLIC PANEL
BUN: 15 mg/dL (ref 6–23)
CHLORIDE: 96 meq/L (ref 96–112)
CO2: 34 mEq/L — ABNORMAL HIGH (ref 19–32)
CREATININE: 1 mg/dL (ref 0.4–1.2)
Calcium: 9.9 mg/dL (ref 8.4–10.5)
GFR: 58.44 mL/min — ABNORMAL LOW (ref >60.00)
Glucose, Bld: 96 mg/dL (ref 70–99)
Potassium: 3.7 mEq/L (ref 3.5–5.1)
SODIUM: 137 meq/L (ref 135–145)

## 2013-08-04 MED ORDER — FUROSEMIDE 40 MG PO TABS: ORAL_TABLET | ORAL | Status: DC

## 2013-08-04 NOTE — Assessment & Plan Note (Signed)
Her lung disease is severe.

## 2013-08-04 NOTE — Progress Notes (Signed)
Patient ID: [name], female   DOB: Jun 16, 1929, 80 y.o.   MRN: [id number]    HPI  Patient is seen to follow coronary disease, severe lung disease, volume overload. She was recently seen by pulmonary for followup. She seemed to be compensated at bedtime. She says she's had some worsening shortness of breath over the past few days. She has noticed some edema. Her weight is increased.  Allergies  Allergen Reactions  . Other Other (See Comments)    TETANUS-can only take 1/2 dose at one time, can take the other 1/2 dose about 3 days later  . Codeine Nausea And Vomiting  . Sulfonamide Derivatives Nausea And Vomiting  . Penicillins Itching, Rash and Other (See Comments)    Nasal itching   . Ranitidine Hcl Itching and Rash    Current Outpatient Prescriptions  Medication Sig Dispense Refill  . acetaminophen (TYLENOL) 500 MG tablet Take 1,000 mg by mouth every 6 (six) hours as needed for moderate pain.      Marland Kitchen. albuterol (PROAIR HFA) 108 (90 BASE) MCG/ACT inhaler INHALE 2 PUFFS BY MOUTH EVERY 6 HOURS ASNEEDED FOR WHEEZING  1 Inhaler  11  . albuterol (PROVENTIL) (2.5 MG/3ML) 0.083% nebulizer solution Take 2.5 mg by nebulization every 6 (six) hours as needed for wheezing.      Marland Kitchen. aspirin EC 81 MG tablet Take 81 mg by mouth daily.      Marland Kitchen. atorvastatin (LIPITOR) 40 MG tablet TAKE 1 TABLET AT BEDTIME  90 tablet  1  . bisoprolol (ZEBETA) 10 MG tablet Take 10 mg by mouth daily.      . budesonide-formoterol (SYMBICORT) 160-4.5 MCG/ACT inhaler Inhale 2 puffs into the lungs 2 (two) times daily.      Marland Kitchen. diltiazem (CARDIZEM CD) 240 MG 24 hr capsule Take 240 mg by mouth daily.      . furosemide (LASIX) 40 MG tablet TAKE 1 TABLET DAILY  90 tablet  1  . IPRATROPIUM BROMIDE NA Place 2 sprays into the nose daily as needed (for stuffiness/wheezing).       Marland Kitchen. losartan (COZAAR) 50 MG tablet TAKE 1 TABLET DAILY  90 tablet  0  . Multiple Vitamins-Minerals (PRESERVISION AREDS 2 PO) Take 1 tablet by mouth 2 (two) times  daily.      . nitroGLYCERIN (NITROSTAT) 0.4 MG SL tablet Place 0.4 mg under the tongue every 5 (five) minutes as needed for chest pain.      . NON FORMULARY Wears home O2 on 2.5 L      . pantoprazole (PROTONIX) 40 MG tablet Take 40 mg by mouth daily.      Bertram Gala. Polyethyl Glycol-Propyl Glycol (SYSTANE) 0.4-0.3 % SOLN Apply 2 drops to eye daily as needed (for dry eyes).      . potassium chloride SA (K-DUR,KLOR-CON) 20 MEQ tablet Take 20 mEq by mouth 2 (two) times daily.      Marland Kitchen. tiotropium (SPIRIVA) 18 MCG inhalation capsule Place 18 mcg into inhaler and inhale daily.       . Vitamin D, Ergocalciferol, (DRISDOL) 50000 UNITS CAPS Take 50,000 Units by mouth every 14 (fourteen) days. Every other Sunday       No current facility-administered medications for this visit.    History   Social History  . Marital Status: Widowed    Spouse Name: N/A    Number of Children: 1  . Years of Education: N/A   Occupational History  . retired     [employer]   Social History  Main Topics  . Smoking status: Former Smoker -- 0.50 packs/day for 59 years    Types: Cigarettes    Quit date: 02/01/2013  . Smokeless tobacco: Not on file  . Alcohol Use: No  . Drug Use: Not on file  . Sexual Activity: Not on file   Other Topics Concern  . Not on file   Social History Narrative  . No narrative on file    Family History  Problem Relation Age of Onset  . Stroke Sister   . Heart failure Mother   . Cancer Sister     Breast and Lung   . Cancer Brother     breast cancer    Past Medical History  Diagnosis Date  . CAD (coronary artery disease)     a. s/p CABG x5 (2005) b. s/p BMS-prox SVG-D2 (2014)  . Hypertension   . Hyperlipidemia   . GI bleed     AVMs  . Aneurysm, aortic     thoracic aorta, stable at 4.1 cm, chest CT, May, 2012  . Syncope     Nitroglycerin plus a diuretic, April, 2009  . Hyponatremia     Chronic. Felt secondary to SIADH   . COPD (chronic obstructive pulmonary disease)   . Tobacco abuse    . Bradycardia   . Carotid artery disease     Hx of endarterectomy. Doppler October, 2011, stable, 0-39% RIC A., 40-59% LICA  . Tuberculosis     Exposures to tuberculosis 1970s, has tested negative by the health Department  . Atrial fibrillation     Paroxysmal with RVR 07/2011, not felt to be a coumadin candidate secondary to hx of GIB/AVM  . Venous stasis of lower extremity     Chronic  . Ejection fraction     a. EF 55-60%, echo, April, 2013 b. EF 55-60%, mild biatrial enlargement and PASP 42 mmH  . CHF with left ventricular diastolic dysfunction, NYHA class 2   . AAA (abdominal aortic aneurysm)   . On home O2     2L N/C  . Complication of anesthesia   . Shortness of breath   . Hx of CABG     CABG 2005  . Hematoma     Right groin hematoma, small, post cath, April, 2014  . Mitral regurgitation     Mild, hospital, March, 2014  . Leg ulcer     Ulcerated lesion on the anterior aspect of her left lower leg, June, 2014    Past Surgical History  Procedure Laterality Date  . Cholecystectomy    . Carotid endartercetomy    . Abdominal hysterectomy    . Coronary artery bypass graft  2005  . Coronary angioplasty with stent placement  07/18/12    severe native coronary artery disease, 99% proximal stenosis of the SVG-D2 status post successful PTCA/PCI with a Veriflex bare-metal stent, widely patent SVGs to both the right PDA sequential OM1 to OM 2, EF 65-70% and 3+ mitral regurgitation    Patient Active Problem List   Diagnosis Date Noted  . Acute respiratory failure 02/09/2013  . Atypical chest pain 02/08/2013  . Cellulitis, leg 09/28/2012  . Leg ulcer   . CAD (coronary artery disease)   . Hematoma   . Mitral regurgitation   . Chronic diastolic CHF (congestive heart failure) 07/20/2012  . Anemia 07/20/2012  . Intermediate coronary syndrome 07/18/2012  . Acute exacerbation of chronic obstructive pulmonary disease (COPD) 02/15/2012  . Dehydration 02/12/2012  . Alkalosis,  metabolic 02/12/2012  .  Generalized weakness 02/11/2012  . Atrial fibrillation   . Chronic respiratory failure 07/29/2011  . H/O steroid therapy 07/28/2011  . Edema   . Aortic valve sclerosis   . Hypertension   . Hyperlipidemia   . GI bleed   . Aneurysm, aortic   . Syncope   . Hyponatremia   . COPD (chronic obstructive pulmonary disease)   . Tobacco abuse   . Bradycardia   . Hx of CABG   . Ejection fraction   . Carotid artery disease   . Tuberculosis   . OXYGEN-USE OF SUPPLEMENTAL 08/02/2009    ROS   Patient denies fever, chills, headache, sweats, rash, change in vision, change in hearing, chest pain, nausea vomiting, urinary symptoms. All other systems are reviewed and are negative  PHYSICAL EXAM  Patient is in a wheelchair. She is using her home O2. There is no jugulovenous distention. Lungs sounds are very distant. She is stable with her oxygen in place. Cardiac exam reveals S1 and S2. The abdomen is soft. There is 1+ peripheral edema.  Filed Vitals:   08/04/13 1433  BP: 145/63  Pulse: 63  Height: 5' 11.5" (1.816 m)  Weight: 181 lb (82.101 kg)     ASSESSMENT & PLAN

## 2013-08-04 NOTE — Assessment & Plan Note (Signed)
Blood pressure is controlled today. No change in therapy. 

## 2013-08-04 NOTE — Assessment & Plan Note (Signed)
Hemoglobin will be checked today to be sure this is stable.

## 2013-08-04 NOTE — Assessment & Plan Note (Signed)
There is some fluid overload adding to her baseline severe lung disease. Her diuretic will be increased to 40 twice a day for 3 days. If her edema decreases she'll go back to once daily. If not she will call us for the next steps.

## 2013-08-04 NOTE — Patient Instructions (Signed)
Your physician has recommended you make the following change in your medication:increase Lasix to 40 mg twice daily for 3 days. If the swelling in your ankles goes away go back to Lasix 40 mg once daily and if not please call Larita FifeLynn (Dr Henrietta HooverKatz's nurse) at [phone] to let us know.  Your physician recommends that you return for lab work in: today  Your physician recommends that you schedule a follow-up appointment in: 2 to 3 weeks

## 2013-08-12 ENCOUNTER — Other Ambulatory Visit: Payer: Self-pay | Admitting: Cardiology

## 2013-08-22 ENCOUNTER — Other Ambulatory Visit: Payer: Self-pay | Admitting: Cardiology

## 2013-08-24 ENCOUNTER — Other Ambulatory Visit: Payer: Self-pay | Admitting: Cardiology

## 2013-08-29 ENCOUNTER — Encounter: Payer: Self-pay | Admitting: Cardiology

## 2013-08-29 ENCOUNTER — Ambulatory Visit (INDEPENDENT_AMBULATORY_CARE_PROVIDER_SITE_OTHER): Payer: Medicare Other | Admitting: Cardiology

## 2013-08-29 VITALS — BP 130/64 | HR 60 | Ht 71.5 in | Wt 185.0 lb

## 2013-08-29 DIAGNOSIS — D649 Anemia, unspecified: Secondary | ICD-10-CM

## 2013-08-29 DIAGNOSIS — J449 Chronic obstructive pulmonary disease, unspecified: Secondary | ICD-10-CM

## 2013-08-29 DIAGNOSIS — I5032 Chronic diastolic (congestive) heart failure: Secondary | ICD-10-CM

## 2013-08-29 DIAGNOSIS — I509 Heart failure, unspecified: Secondary | ICD-10-CM

## 2013-08-29 DIAGNOSIS — I10 Essential (primary) hypertension: Secondary | ICD-10-CM

## 2013-08-29 MED ORDER — FUROSEMIDE 40 MG PO TABS: 40.0000 mg | ORAL_TABLET | Freq: Two times a day (BID) | ORAL | Status: DC

## 2013-08-29 NOTE — Assessment & Plan Note (Signed)
Her hemoglobin is stable at 11. No further workup.

## 2013-08-29 NOTE — Assessment & Plan Note (Signed)
Lung disease is severe. She is wearing her home oxygen.

## 2013-08-29 NOTE — Assessment & Plan Note (Signed)
Blood pressure is controlled. No change in therapy. 

## 2013-08-29 NOTE — Patient Instructions (Signed)
**Note De-Identified  Obfuscation** Your physician has recommended you make the following change in your medication: increase Furosemide to 40 mg twice daily  Please limit your fluid intake  Your physician recommends that you schedule a follow-up appointment in: 2 months

## 2013-08-29 NOTE — Progress Notes (Signed)
Patient ID: [name], female   DOB: [birth year], 80 y.o.   MRN: [id number]    HPI  Patient is seen today for followup of chronic lung disease and volume overload. When I saw her last August 04, 2013 I increased her diuretic for a few days. She did diuresis. However she returns today and her weight is even higher. She says that she gets very short of breath with any exertion. I had a long discussion about limiting her salt and fluid intake.  Allergies  Allergen Reactions  . Other Other (See Comments)    TETANUS-can only take 1/2 dose at one time, can take the other 1/2 dose about 3 days later  . Codeine Nausea And Vomiting  . Sulfonamide Derivatives Nausea And Vomiting  . Penicillins Itching, Rash and Other (See Comments)    Nasal itching   . Ranitidine Hcl Itching and Rash    Current Outpatient Prescriptions  Medication Sig Dispense Refill  . acetaminophen (TYLENOL) 500 MG tablet Take 1,000 mg by mouth every 6 (six) hours as needed for moderate pain.      Marland Kitchen albuterol (PROAIR HFA) 108 (90 BASE) MCG/ACT inhaler INHALE 2 PUFFS BY MOUTH EVERY 6 HOURS ASNEEDED FOR WHEEZING  1 Inhaler  11  . albuterol (PROVENTIL) (2.5 MG/3ML) 0.083% nebulizer solution Take 2.5 mg by nebulization every 6 (six) hours as needed for wheezing.      Marland Kitchen aspirin EC 81 MG tablet Take 81 mg by mouth daily.      Marland Kitchen atorvastatin (LIPITOR) 40 MG tablet TAKE 1 TABLET AT BEDTIME  90 tablet  1  . bisoprolol (ZEBETA) 10 MG tablet TAKE 1 TABLET DAILY  90 tablet  0  . budesonide-formoterol (SYMBICORT) 160-4.5 MCG/ACT inhaler Inhale 2 puffs into the lungs 2 (two) times daily.      Marland Kitchen CARTIA XT 240 MG 24 hr capsule TAKE 1 CAPSULE DAILY  90 capsule  0  . furosemide (LASIX) 40 MG tablet Take 40 mg by mouth daily.      . IPRATROPIUM BROMIDE NA Place 2 sprays into the nose daily as needed (for stuffiness/wheezing).       Marland Kitchen losartan (COZAAR) 50 MG tablet Take 1 tablet (50 mg total) by mouth daily.  90 tablet  0  . Multiple  Vitamins-Minerals (PRESERVISION AREDS 2 PO) Take 1 tablet by mouth 2 (two) times daily.      . nitroGLYCERIN (NITROSTAT) 0.4 MG SL tablet Place 0.4 mg under the tongue every 5 (five) minutes as needed for chest pain.      . NON FORMULARY Wears home O2 on 2.5 L      . pantoprazole (PROTONIX) 40 MG tablet Take 40 mg by mouth daily.      Bertram Gala Glycol-Propyl Glycol (SYSTANE) 0.4-0.3 % SOLN Apply 2 drops to eye daily as needed (for dry eyes).      . potassium chloride SA (K-DUR,KLOR-CON) 20 MEQ tablet Take 20 mEq by mouth 2 (two) times daily.      Marland Kitchen tiotropium (SPIRIVA) 18 MCG inhalation capsule Place 18 mcg into inhaler and inhale daily.       . Vitamin D, Ergocalciferol, (DRISDOL) 50000 UNITS CAPS Take 50,000 Units by mouth every 14 (fourteen) days. Every other Sunday       No current facility-administered medications for this visit.    History   Social History  . Marital Status: Widowed    Spouse Name: N/A    Number of Children: 1  . Years  of Education: N/A   Occupational History  . retired     [employer]   Social History Main Topics  . Smoking status: Former Smoker -- 0.50 packs/day for 59 years    Types: Cigarettes    Quit date: 02/01/2013  . Smokeless tobacco: Not on file  . Alcohol Use: No  . Drug Use: Not on file  . Sexual Activity: Not on file   Other Topics Concern  . Not on file   Social History Narrative  . No narrative on file    Family History  Problem Relation Age of Onset  . Stroke Sister   . Heart failure Mother   . Cancer Sister     Breast and Lung   . Cancer Brother     breast cancer    Past Medical History  Diagnosis Date  . CAD (coronary artery disease)     a. s/p CABG x5 (2005) b. s/p BMS-prox SVG-D2 (2014)  . Hypertension   . Hyperlipidemia   . GI bleed     AVMs  . Aneurysm, aortic     thoracic aorta, stable at 4.1 cm, chest CT, May, 2012  . Syncope     Nitroglycerin plus a diuretic, April, 2009  . Hyponatremia     Chronic. Felt  secondary to SIADH   . COPD (chronic obstructive pulmonary disease)   . Tobacco abuse   . Bradycardia   . Carotid artery disease     Hx of endarterectomy. Doppler October, 2011, stable, 0-39% RIC A., 40-59% LICA  . Tuberculosis     Exposures to tuberculosis 1970s, has tested negative by the health Department  . Atrial fibrillation     Paroxysmal with RVR 07/2011, not felt to be a coumadin candidate secondary to hx of GIB/AVM  . Venous stasis of lower extremity     Chronic  . Ejection fraction     a. EF 55-60%, echo, April, 2013 b. EF 55-60%, mild biatrial enlargement and PASP 42 mmH  . CHF with left ventricular diastolic dysfunction, NYHA class 2   . AAA (abdominal aortic aneurysm)   . On home O2     2L N/C  . Complication of anesthesia   . Shortness of breath   . Hx of CABG     CABG 2005  . Hematoma     Right groin hematoma, small, post cath, April, 2014  . Mitral regurgitation     Mild, hospital, March, 2014  . Leg ulcer     Ulcerated lesion on the anterior aspect of her left lower leg, June, 2014    Past Surgical History  Procedure Laterality Date  . Cholecystectomy    . Carotid endartercetomy    . Abdominal hysterectomy    . Coronary artery bypass graft  2005  . Coronary angioplasty with stent placement  07/18/12    severe native coronary artery disease, 99% proximal stenosis of the SVG-D2 status post successful PTCA/PCI with a Veriflex bare-metal stent, widely patent SVGs to both the right PDA sequential OM1 to OM 2, EF 65-70% and 3+ mitral regurgitation    Patient Active Problem List   Diagnosis Date Noted  . Acute respiratory failure 02/09/2013  . Atypical chest pain 02/08/2013  . Cellulitis, leg 09/28/2012  . Leg ulcer   . CAD (coronary artery disease)   . Hematoma   . Mitral regurgitation   . Chronic diastolic CHF (congestive heart failure) 07/20/2012  . Anemia 07/20/2012  . Intermediate coronary syndrome 07/18/2012  .  Acute exacerbation of chronic  obstructive pulmonary disease (COPD) 02/15/2012  . Dehydration 02/12/2012  . Alkalosis, metabolic 02/12/2012  . Generalized weakness 02/11/2012  . Atrial fibrillation   . Chronic respiratory failure 07/29/2011  . H/O steroid therapy 07/28/2011  . Edema   . Aortic valve sclerosis   . Hypertension   . Hyperlipidemia   . GI bleed   . Aneurysm, aortic   . Syncope   . Hyponatremia   . COPD (chronic obstructive pulmonary disease)   . Tobacco abuse   . Bradycardia   . Hx of CABG   . Ejection fraction   . Carotid artery disease   . Tuberculosis   . OXYGEN-USE OF SUPPLEMENTAL 08/02/2009    ROS   Patient denies fever, chills, headache, sweats, rash, change in vision, change in hearing, chest pain, nausea vomiting, urinary symptoms. All other systems are reviewed and are negative.  PHYSICAL EXAM  Patient is oriented to person time and place. Affect is normal. She is overweight. She is in a wheelchair. Head is atraumatic. Sclera and conjunctiva are normal. Lungs reveal decreased breath sounds. There are a few scattered rhonchi. There is kyphosis of the thoracic spine. Abdomen is soft. There is 1+ peripheral edema.  Filed Vitals:   08/29/13 1040  BP: 130/64  Pulse: 60  Height: 5' 11.5" (1.816 m)  Weight: 185 lb (83.915 kg)     ASSESSMENT & PLAN

## 2013-08-29 NOTE — Assessment & Plan Note (Signed)
She is volume overloaded. I will increase her Lasix to 40 twice a day permanently. I have talked with her extensively about limiting salt and fluid intake.

## 2013-10-09 ENCOUNTER — Telehealth: Payer: Self-pay | Admitting: Cardiovascular Disease

## 2013-10-09 NOTE — Telephone Encounter (Signed)
Closed encounter °

## 2013-10-10 NOTE — Telephone Encounter (Signed)
Closed encounter °

## 2013-10-17 ENCOUNTER — Ambulatory Visit (INDEPENDENT_AMBULATORY_CARE_PROVIDER_SITE_OTHER): Payer: Medicare Other | Admitting: Cardiovascular Disease

## 2013-10-17 ENCOUNTER — Encounter: Payer: Self-pay | Admitting: Cardiovascular Disease

## 2013-10-17 VITALS — BP 131/79 | HR 105 | Ht 71.0 in | Wt 189.0 lb

## 2013-10-17 DIAGNOSIS — I739 Peripheral vascular disease, unspecified: Secondary | ICD-10-CM | POA: Insufficient documentation

## 2013-10-17 NOTE — Progress Notes (Signed)
10/17/2013 Otilio SaberEtta J Heyliger   21-Jun-1929  [id number]  Primary Physician [name],[name], MD Primary Cardiologist: Runell GessJonathan J. Berry MD Roseanne RenoFACP,FACC,FAHA, FSCAI   HPI:  Ms. Thad RangerReynolds is an 80 year old widowed Caucasian female mother of one child accompanied by one of her sisters today. She is was referred by Dr. Ardath SaxPeter Parker at the wound care center for evaluation of a left anterior shin ulcer. This occurred after she injured her shin on 07/2512.  Her cardiac risk factors are propels positive for 50-60-pack-year history tobacco abuse having recently quit. She has treated hypertension and hyperlipidemia. She does have known heart disease status post coronary artery bypass grafting in the past as well as stenting. She has had a heart attack but has never had a stroke. She has severe limiting COPD and is oxygen dependent. She had loggia mid-upper performed in our office/27/14 revealed ABIs of 8.6 range bilaterally a high-grade mid right SFA stenosis, moderate left SFAs stenosis with one-vessel runoff.  She really does not ambulate enough to notice whether or not she has claudication. Since I saw her back year ago that the wound has healed. She does have some swelling in her right leg which I suspect is related to venous insufficiency. Venous Dopplers of that time showed no evidence of this or DVT.    Current Outpatient Prescriptions  Medication Sig Dispense Refill  . acetaminophen (TYLENOL) 500 MG tablet Take 1,000 mg by mouth every 6 (six) hours as needed for moderate pain.      Marland Kitchen. albuterol (PROAIR HFA) 108 (90 BASE) MCG/ACT inhaler INHALE 2 PUFFS BY MOUTH EVERY 6 HOURS ASNEEDED FOR WHEEZING  1 Inhaler  11  . albuterol (PROVENTIL) (2.5 MG/3ML) 0.083% nebulizer solution Take 2.5 mg by nebulization every 6 (six) hours as needed for wheezing.      Marland Kitchen. aspirin EC 81 MG tablet Take 81 mg by mouth daily.      Marland Kitchen. atorvastatin (LIPITOR) 40 MG tablet TAKE 1 TABLET AT BEDTIME  90 tablet  1  . bisoprolol  (ZEBETA) 10 MG tablet TAKE 1 TABLET DAILY  90 tablet  0  . budesonide-formoterol (SYMBICORT) 160-4.5 MCG/ACT inhaler Inhale 2 puffs into the lungs 2 (two) times daily.      Marland Kitchen. CARTIA XT 240 MG 24 hr capsule TAKE 1 CAPSULE DAILY  90 capsule  0  . furosemide (LASIX) 40 MG tablet Take 1 tablet (40 mg total) by mouth 2 (two) times daily.  180 tablet  1  . IPRATROPIUM BROMIDE NA Place 2 sprays into the nose daily as needed (for stuffiness/wheezing).       Marland Kitchen. losartan (COZAAR) 50 MG tablet Take 1 tablet (50 mg total) by mouth daily.  90 tablet  0  . Multiple Vitamins-Minerals (PRESERVISION AREDS 2 PO) Take 1 tablet by mouth 2 (two) times daily.      . nitroGLYCERIN (NITROSTAT) 0.4 MG SL tablet Place 0.4 mg under the tongue every 5 (five) minutes as needed for chest pain.      . NON FORMULARY Wears home O2 on 2.5 L      . pantoprazole (PROTONIX) 40 MG tablet Take 40 mg by mouth daily.      Bertram Gala. Polyethyl Glycol-Propyl Glycol (SYSTANE) 0.4-0.3 % SOLN Apply 2 drops to eye daily as needed (for dry eyes).      . potassium chloride SA (K-DUR,KLOR-CON) 20 MEQ tablet Take 20 mEq by mouth 2 (two) times daily.      Marland Kitchen. tiotropium (SPIRIVA) 18 MCG inhalation  capsule Place 18 mcg into inhaler and inhale daily.       . Vitamin D, Ergocalciferol, (DRISDOL) 50000 UNITS CAPS Take 50,000 Units by mouth every 14 (fourteen) days. Every other Sunday       No current facility-administered medications for this visit.    Allergies  Allergen Reactions  . Other Other (See Comments)    TETANUS-can only take 1/2 dose at one time, can take the other 1/2 dose about 3 days later  . Codeine Nausea And Vomiting  . Sulfonamide Derivatives Nausea And Vomiting  . Penicillins Itching, Rash and Other (See Comments)    Nasal itching   . Ranitidine Hcl Itching and Rash    History   Social History  . Marital Status: Widowed    Spouse Name: N/A    Number of Children: 1  . Years of Education: N/A   Occupational History  . retired       [employer]   Social History Main Topics  . Smoking status: Former Smoker -- 0.50 packs/day for 59 years    Types: Cigarettes    Quit date: 02/01/2013  . Smokeless tobacco: Not on file  . Alcohol Use: No  . Drug Use: Not on file  . Sexual Activity: Not on file   Other Topics Concern  . Not on file   Social History Narrative  . No narrative on file     Review of Systems: General: negative for chills, fever, night sweats or weight changes.  Cardiovascular: negative for chest pain, dyspnea on exertion, edema, orthopnea, palpitations, paroxysmal nocturnal dyspnea or shortness of breath Dermatological: negative for rash Respiratory: negative for cough or wheezing Urologic: negative for hematuria Abdominal: negative for nausea, vomiting, diarrhea, bright red blood per rectum, melena, or hematemesis Neurologic: negative for visual changes, syncope, or dizziness All other systems reviewed and are otherwise negative except as noted above.    Blood pressure 131/79, pulse 105, height 5\' 11"  (1.803 m), weight 189 lb (85.73 kg).  General appearance: alert and no distress Neck: no adenopathy, no carotid bruit, no JVD, supple, symmetrical, trachea midline and thyroid not enlarged, symmetric, no tenderness/mass/nodules Lungs: clear to auscultation bilaterally Heart: regular rate and rhythm, S1, S2 normal, no murmur, click, rub or gallop Extremities: 2 edema with some weeping right calf  EKG not performed today  ASSESSMENT AND PLAN:   Peripheral arterial disease I saw Ms. Gilmartin back a year ago. She had a nonhealing ulcer which ultimately healed. Dr. Jerral BonitoJeff Katz is her cardiologist. Lower extremity arterial Doppler studies performed 10/21/12 revealed ABIs in the 0.6 range bilaterally with high-grade SFA disease. Patient really denies claudication but does not emulate much. She has some swelling in her right lower extremity which appears to be related to venous insufficiency. I suggested  compression stockings which he does not think she's able to put on. She did have venous Dopplers performed a year ago which did not show venous insufficiency or DVT. At this point, I will see her back as needed but she has not had it for intervention.      Runell GessJonathan J. Berry MD FACP,FACC,FAHA, [facility] Center[facility] 10/17/2013 12:06 PM

## 2013-10-17 NOTE — Assessment & Plan Note (Signed)
I saw [name] back a year ago. She had a nonhealing ulcer which ultimately healed. Dr. Jerral BonitoJeff Katz is her cardiologist. Lower extremity arterial Doppler studies performed 10/21/12 revealed ABIs in the 0.6 range bilaterally with high-grade SFA disease. Patient really denies claudication but does not emulate much. She has some swelling in her right lower extremity which appears to be related to venous insufficiency. I suggested compression stockings which he does not think she's able to put on. She did have venous Dopplers performed a year ago which did not show venous insufficiency or DVT. At this point, I will see her back as needed but she has not had it for intervention.

## 2013-10-17 NOTE — Patient Instructions (Signed)
Follow up with Dr Berry as needed.  

## 2013-10-30 ENCOUNTER — Ambulatory Visit: Payer: Medicare Other | Admitting: Cardiology

## 2013-11-02 ENCOUNTER — Ambulatory Visit: Payer: Medicare Other | Admitting: Cardiology

## 2013-11-04 ENCOUNTER — Other Ambulatory Visit: Payer: Self-pay | Admitting: Cardiology

## 2013-11-07 ENCOUNTER — Ambulatory Visit (INDEPENDENT_AMBULATORY_CARE_PROVIDER_SITE_OTHER): Payer: Medicare Other | Admitting: Nurse Practitioner

## 2013-11-07 ENCOUNTER — Encounter: Payer: Self-pay | Admitting: Nurse Practitioner

## 2013-11-07 VITALS — BP 120/70 | HR 102 | Ht 71.0 in

## 2013-11-07 DIAGNOSIS — I480 Paroxysmal atrial fibrillation: Secondary | ICD-10-CM

## 2013-11-07 DIAGNOSIS — I5032 Chronic diastolic (congestive) heart failure: Secondary | ICD-10-CM

## 2013-11-07 DIAGNOSIS — I4891 Unspecified atrial fibrillation: Secondary | ICD-10-CM

## 2013-11-07 DIAGNOSIS — R0600 Dyspnea, unspecified: Secondary | ICD-10-CM

## 2013-11-07 DIAGNOSIS — R0989 Other specified symptoms and signs involving the circulatory and respiratory systems: Secondary | ICD-10-CM

## 2013-11-07 DIAGNOSIS — R0609 Other forms of dyspnea: Secondary | ICD-10-CM

## 2013-11-07 LAB — CBC
HCT: 35.8 % — ABNORMAL LOW (ref 36.0–46.0)
Hemoglobin: 11.5 g/dL — ABNORMAL LOW (ref 12.0–15.0)
MCHC: 32.1 g/dL (ref 30.0–36.0)
MCV: 87.1 fl (ref 78.0–100.0)
Platelets: 203 10*3/uL (ref 150.0–400.0)
RBC: 4.11 Mil/uL (ref 3.87–5.11)
RDW: 14.8 % (ref 11.5–15.5)
WBC: 8.1 10*3/uL (ref 4.0–10.5)

## 2013-11-07 LAB — BASIC METABOLIC PANEL
BUN: 23 mg/dL (ref 6–23)
CO2: 31 mEq/L (ref 19–32)
Calcium: 10.3 mg/dL (ref 8.4–10.5)
Chloride: 99 mEq/L (ref 96–112)
Creatinine, Ser: 1.2 mg/dL (ref 0.4–1.2)
GFR: 46.58 mL/min — ABNORMAL LOW (ref >60.00)
Glucose, Bld: 152 mg/dL — ABNORMAL HIGH (ref 70–99)
Potassium: 3.5 mEq/L (ref 3.5–5.1)
Sodium: 139 mEq/L (ref 135–145)

## 2013-11-07 LAB — TSH: TSH: 2.31 u[IU]/mL (ref 0.35–4.50)

## 2013-11-07 LAB — BRAIN NATRIURETIC PEPTIDE: Pro B Natriuretic peptide (BNP): 181 pg/mL — ABNORMAL HIGH (ref 0.0–100.0)

## 2013-11-07 MED ORDER — ATORVASTATIN CALCIUM 40 MG PO TABS: 20.0000 mg | ORAL_TABLET | Freq: Every day | ORAL | Status: DC

## 2013-11-07 MED ORDER — DILTIAZEM HCL ER COATED BEADS 360 MG PO CP24: 360.0000 mg | ORAL_CAPSULE | Freq: Every day | ORAL | Status: DC

## 2013-11-07 NOTE — Patient Instructions (Addendum)
We will check lab today  Stop the Losartan  Cut the Lipitor (atorvastatin) in half and just take half  Stop the Cartia 240 mg  Increase the Cartia to 360 mg daily - this is at the drug store.   See me in 4 to 6 weeks with Dr. Myrtis SerKatz

## 2013-11-07 NOTE — Progress Notes (Signed)
[name] Date of Birth: [birth year] Medical Record #[id number]  History of Present Illness: [name] is seen back today for a 2 month check. Seen for Dr. Myrtis Ser. She is an 80 year old female with multiple medical issues. These include chronic lung disease, CAD with remote CABG x 5 in 2005, s/p BMS to the prox SVG to D2 in 2014, HTN, HLD, Gl bleed from AVMs, thoracic aneurysm, chronic hyponatremia - SIADH, COPD, tobacco abuse, carotid disease. PAF and not a candidate for anticoagulation due to AVMs/bleeding, and chronic diastolic HF with normal EF.   Here in early May - continued to struggle with breathing, weight, fluid - using too much salt.   Just saw Dr. Allyson Sabal at the end of June for a shin ulcer - he did not feel further evaluation/treatment was needed.   Comes back today. Here alone. Remains short of breath. Feels her heart racing at times - says that has been going on for some time. Does not sound like her breathing has gotten any worse - says she still "can't do anything". Sits all day. Very limited by her breathing but does not sound like it is any worse than her baseline. Little dizzy. Pretty sedentary. No active bleeding. Some swelling. Tells me she is restricting her salt. Has transportation issues - limited in how she can get here.    Current Outpatient Prescriptions  Medication Sig Dispense Refill  . acetaminophen (TYLENOL) 500 MG tablet Take 1,000 mg by mouth every 6 (six) hours as needed for moderate pain.      Marland Kitchen albuterol (PROAIR HFA) 108 (90 BASE) MCG/ACT inhaler INHALE 2 PUFFS BY MOUTH EVERY 6 HOURS ASNEEDED FOR WHEEZING  1 Inhaler  11  . albuterol (PROVENTIL) (2.5 MG/3ML) 0.083% nebulizer solution Take 2.5 mg by nebulization every 6 (six) hours as needed for wheezing.      Marland Kitchen aspirin EC 81 MG tablet Take 81 mg by mouth daily.      Marland Kitchen atorvastatin (LIPITOR) 40 MG tablet Take 0.5 tablets (20 mg total) by mouth daily at 6 PM.  90 tablet  1  . bisoprolol (ZEBETA) 10 MG  tablet TAKE 1 TABLET DAILY  90 tablet  0  . budesonide-formoterol (SYMBICORT) 160-4.5 MCG/ACT inhaler Inhale 2 puffs into the lungs 2 (two) times daily.      . furosemide (LASIX) 40 MG tablet Take 1 tablet (40 mg total) by mouth 2 (two) times daily.  180 tablet  1  . IPRATROPIUM BROMIDE NA Place 2 sprays into the nose daily as needed (for stuffiness/wheezing).       . Multiple Vitamins-Minerals (PRESERVISION AREDS 2 PO) Take 1 tablet by mouth 2 (two) times daily.      . nitroGLYCERIN (NITROSTAT) 0.4 MG SL tablet Place 0.4 mg under the tongue every 5 (five) minutes as needed for chest pain.      . NON FORMULARY Wears home O2 on 2.5 L      . pantoprazole (PROTONIX) 40 MG tablet Take 40 mg by mouth daily.      Bertram Gala Glycol-Propyl Glycol (SYSTANE) 0.4-0.3 % SOLN Apply 2 drops to eye daily as needed (for dry eyes).      . potassium chloride SA (K-DUR,KLOR-CON) 20 MEQ tablet Take 20 mEq by mouth 2 (two) times daily.      Marland Kitchen tiotropium (SPIRIVA) 18 MCG inhalation capsule Place 18 mcg into inhaler and inhale daily.       . Vitamin D, Ergocalciferol, (DRISDOL) 50000 UNITS CAPS  Take 50,000 Units by mouth every 14 (fourteen) days. Every other Sunday      . diltiazem (CARDIZEM CD) 360 MG 24 hr capsule Take 1 capsule (360 mg total) by mouth daily.  30 capsule  6   No current facility-administered medications for this visit.    Allergies  Allergen Reactions  . Other Other (See Comments)    TETANUS-can only take 1/2 dose at one time, can take the other 1/2 dose about 3 days later  . Codeine Nausea And Vomiting  . Sulfonamide Derivatives Nausea And Vomiting  . Penicillins Itching, Rash and Other (See Comments)    Nasal itching   . Ranitidine Hcl Itching and Rash    Past Medical History  Diagnosis Date  . CAD (coronary artery disease)     a. s/p CABG x5 (2005) b. s/p BMS-prox SVG-D2 (2014)  . Hypertension   . Hyperlipidemia   . GI bleed     AVMs  . Aneurysm, aortic     thoracic aorta,  stable at 4.1 cm, chest CT, May, 2012  . Syncope     Nitroglycerin plus a diuretic, April, 2009  . Hyponatremia     Chronic. Felt secondary to SIADH   . COPD (chronic obstructive pulmonary disease)   . Tobacco abuse   . Bradycardia   . Carotid artery disease     Hx of endarterectomy. Doppler October, 2011, stable, 0-39% RIC A., 40-59% LICA  . Tuberculosis     Exposures to tuberculosis 1970s, has tested negative by the health Department  . Atrial fibrillation     Paroxysmal with RVR 07/2011, not felt to be a coumadin candidate secondary to hx of GIB/AVM  . Venous stasis of lower extremity     Chronic  . Ejection fraction     a. EF 55-60%, echo, April, 2013 b. EF 55-60%, mild biatrial enlargement and PASP 42 mmH  . CHF with left ventricular diastolic dysfunction, NYHA class 2   . AAA (abdominal aortic aneurysm)   . On home O2     2L N/C  . Complication of anesthesia   . Shortness of breath   . Hx of CABG     CABG 2005  . Hematoma     Right groin hematoma, small, post cath, April, 2014  . Mitral regurgitation     Mild, hospital, March, 2014  . Leg ulcer     Ulcerated lesion on the anterior aspect of her left lower leg, June, 2014    Past Surgical History  Procedure Laterality Date  . Cholecystectomy    . Carotid endartercetomy    . Abdominal hysterectomy    . Coronary artery bypass graft  2005  . Coronary angioplasty with stent placement  07/18/12    severe native coronary artery disease, 99% proximal stenosis of the SVG-D2 status post successful PTCA/PCI with a Veriflex bare-metal stent, widely patent SVGs to both the right PDA sequential OM1 to OM 2, EF 65-70% and 3+ mitral regurgitation    History  Smoking status  . Former Smoker -- 0.50 packs/day for 59 years  . Types: Cigarettes  . Quit date: 02/01/2013  Smokeless tobacco  . Not on file    History  Alcohol Use No    Family History  Problem Relation Age of Onset  . Stroke Sister   . Heart failure Mother   .  Cancer Sister     Breast and Lung   . Cancer Brother     breast cancer  Review of Systems: The review of systems is per the HPI.  All other systems were reviewed and are negative.  Physical Exam: BP 120/70  Pulse 102  Ht 5\' 11"  (1.803 m)  SpO2 96% Patient is chronically ill and in fairly no acute distress. She is in a wheelchair. Skin is warm and dry. Color is normal.  HEENT is unremarkable. Normocephalic/atraumatic. PERRL. Sclera are nonicteric. Neck is supple. No masses. No JVD. Lungs are fclear. Cardiac exam shows an irregular rate and rhythm. Rate is a little fast. Abdomen is soft. Extremities are with trace edema. Multiple places of where she has scratched. Gait not tested. ROM are intact. No gross neurologic deficits noted.   Wt Readings from Last 3 Encounters:  10/17/13 189 lb (85.73 kg)  08/29/13 185 lb (83.915 kg)  08/04/13 181 lb (82.101 kg)    LABORATORY DATA/PROCEDURES:  Lab Results  Component Value Date   WBC 11.1* 08/04/2013   HGB 11.0* 08/04/2013   HCT 33.4* 08/04/2013   PLT 263.0 08/04/2013   GLUCOSE 96 08/04/2013   CHOL 121 07/19/2012   TRIG 102 07/19/2012   HDL 39* 07/19/2012   LDLCALC 62 07/19/2012   ALT 13 07/18/2012   AST 20 07/18/2012   NA 137 08/04/2013   K 3.7 08/04/2013   CL 96 08/04/2013   CREATININE 1.0 08/04/2013   BUN 15 08/04/2013   CO2 34* 08/04/2013   TSH 0.942 07/18/2012   INR 1.04 07/18/2012   HGBA1C 5.9* 07/18/2012    BNP (last 3 results)  Recent Labs  04/28/13 1857  PROBNP 458.0*   Echo Study Conclusions from March 2014  - Left ventricle: The cavity size was normal. Wall thickness was normal. Systolic function was normal. The estimated ejection fraction was in the range of 55% to 60%. Wall motion was normal; there were no regional wall motion abnormalities. - Left atrium: The atrium was mildly dilated. - Right atrium: The atrium was mildly dilated. - Pulmonary arteries: Systolic pressure was mildly increased. PA peak pressure: 42mm Hg  (S).    Assessment / Plan: 1. Progressive DOE - most likely multifactorial  2. Atrial fib - rate not controlled. Not a candidate for anticoagulation. I am increasing the Cartia to 360 mg a day. Stopping the Losartan to let her BP come up.  3. HTN - BP low normal - will stop the Losartan since I am increasing the Cartia for better rate control.  4. COPD - quite limited - may need to consider palliative care services.  5. Diastolic HF - on lasix. She is not able to weigh in the office any longer.   See back in 4 to 6 weeks - she has lots of transportation issues and cannot come sooner.   Patient is agreeable to this plan and will call if any problems develop in the interim.   Rosalio MacadamiaLori C. Neleh Muldoon, RN, ANP-C [facility] [address] [place], [place]  [id number] [phone]

## 2013-11-14 ENCOUNTER — Emergency Department (HOSPITAL_COMMUNITY): Payer: Medicare Other

## 2013-11-14 ENCOUNTER — Encounter (HOSPITAL_COMMUNITY): Payer: Self-pay | Admitting: Emergency Medicine

## 2013-11-14 ENCOUNTER — Inpatient Hospital Stay (HOSPITAL_COMMUNITY)
Admission: EM | Admit: 2013-11-14 | Discharge: 2013-11-20 | DRG: 291 | Disposition: A | Discharge status: Skilled Nursing Facility | Payer: Medicare Other | Attending: Family Medicine | Admitting: Family Medicine

## 2013-11-14 DIAGNOSIS — J449 Chronic obstructive pulmonary disease, unspecified: Secondary | ICD-10-CM | POA: Diagnosis present

## 2013-11-14 DIAGNOSIS — I714 Abdominal aortic aneurysm, without rupture, unspecified: Secondary | ICD-10-CM | POA: Diagnosis present

## 2013-11-14 DIAGNOSIS — Z87891 Personal history of nicotine dependence: Secondary | ICD-10-CM

## 2013-11-14 DIAGNOSIS — I482 Chronic atrial fibrillation, unspecified: Secondary | ICD-10-CM

## 2013-11-14 DIAGNOSIS — J9600 Acute respiratory failure, unspecified whether with hypoxia or hypercapnia: Secondary | ICD-10-CM | POA: Diagnosis present

## 2013-11-14 DIAGNOSIS — Z8249 Family history of ischemic heart disease and other diseases of the circulatory system: Secondary | ICD-10-CM

## 2013-11-14 DIAGNOSIS — I712 Thoracic aortic aneurysm, without rupture, unspecified: Secondary | ICD-10-CM | POA: Diagnosis present

## 2013-11-14 DIAGNOSIS — I5033 Acute on chronic diastolic (congestive) heart failure: Secondary | ICD-10-CM | POA: Diagnosis not present

## 2013-11-14 DIAGNOSIS — N183 Chronic kidney disease, stage 3 unspecified: Secondary | ICD-10-CM | POA: Diagnosis present

## 2013-11-14 DIAGNOSIS — Z9861 Coronary angioplasty status: Secondary | ICD-10-CM

## 2013-11-14 DIAGNOSIS — K5520 Angiodysplasia of colon without hemorrhage: Secondary | ICD-10-CM | POA: Diagnosis present

## 2013-11-14 DIAGNOSIS — R0600 Dyspnea, unspecified: Secondary | ICD-10-CM

## 2013-11-14 DIAGNOSIS — I509 Heart failure, unspecified: Secondary | ICD-10-CM | POA: Diagnosis present

## 2013-11-14 DIAGNOSIS — I4891 Unspecified atrial fibrillation: Secondary | ICD-10-CM

## 2013-11-14 DIAGNOSIS — R079 Chest pain, unspecified: Secondary | ICD-10-CM

## 2013-11-14 DIAGNOSIS — E871 Hypo-osmolality and hyponatremia: Secondary | ICD-10-CM | POA: Diagnosis present

## 2013-11-14 DIAGNOSIS — I2510 Atherosclerotic heart disease of native coronary artery without angina pectoris: Secondary | ICD-10-CM | POA: Diagnosis present

## 2013-11-14 DIAGNOSIS — I10 Essential (primary) hypertension: Secondary | ICD-10-CM | POA: Diagnosis not present

## 2013-11-14 DIAGNOSIS — E785 Hyperlipidemia, unspecified: Secondary | ICD-10-CM | POA: Diagnosis present

## 2013-11-14 DIAGNOSIS — J4489 Other specified chronic obstructive pulmonary disease: Secondary | ICD-10-CM | POA: Diagnosis present

## 2013-11-14 DIAGNOSIS — J441 Chronic obstructive pulmonary disease with (acute) exacerbation: Secondary | ICD-10-CM

## 2013-11-14 DIAGNOSIS — J9601 Acute respiratory failure with hypoxia: Secondary | ICD-10-CM

## 2013-11-14 DIAGNOSIS — R0609 Other forms of dyspnea: Secondary | ICD-10-CM | POA: Diagnosis not present

## 2013-11-14 DIAGNOSIS — R0989 Other specified symptoms and signs involving the circulatory and respiratory systems: Secondary | ICD-10-CM | POA: Diagnosis not present

## 2013-11-14 DIAGNOSIS — I5032 Chronic diastolic (congestive) heart failure: Secondary | ICD-10-CM

## 2013-11-14 DIAGNOSIS — Z9981 Dependence on supplemental oxygen: Secondary | ICD-10-CM

## 2013-11-14 DIAGNOSIS — I129 Hypertensive chronic kidney disease with stage 1 through stage 4 chronic kidney disease, or unspecified chronic kidney disease: Secondary | ICD-10-CM | POA: Diagnosis present

## 2013-11-14 DIAGNOSIS — Z951 Presence of aortocoronary bypass graft: Secondary | ICD-10-CM

## 2013-11-14 DIAGNOSIS — R5381 Other malaise: Secondary | ICD-10-CM | POA: Diagnosis present

## 2013-11-14 DIAGNOSIS — I9589 Other hypotension: Secondary | ICD-10-CM | POA: Diagnosis not present

## 2013-11-14 DIAGNOSIS — Z7982 Long term (current) use of aspirin: Secondary | ICD-10-CM

## 2013-11-14 LAB — CBC
HEMATOCRIT: 37.7 % (ref 36.0–46.0)
Hemoglobin: 11.6 g/dL — ABNORMAL LOW (ref 12.0–15.0)
MCH: 28.4 pg (ref 26.0–34.0)
MCHC: 30.8 g/dL (ref 30.0–36.0)
MCV: 92.4 fL (ref 78.0–100.0)
Platelets: 177 10*3/uL (ref 150–400)
RBC: 4.08 MIL/uL (ref 3.87–5.11)
RDW: 13.7 % (ref 11.5–15.5)
WBC: 7.6 10*3/uL (ref 4.0–10.5)

## 2013-11-14 LAB — I-STAT TROPONIN, ED: Troponin i, poc: 0.01 ng/mL (ref 0.00–0.08)

## 2013-11-14 MED ORDER — IPRATROPIUM-ALBUTEROL 0.5-2.5 (3) MG/3ML IN SOLN
3.0000 mL | Freq: Once | RESPIRATORY_TRACT | Status: AC
  Administered 2013-11-14: 3 mL via RESPIRATORY_TRACT
  Filled 2013-11-14: qty 3

## 2013-11-14 NOTE — ED Notes (Signed)
[facility] presents with a 80 yo female from home with chest pain/tightness for several days and SOB.  Pt cannot specify when this started exactly however she states it just got to the poibnt where she could not take it anymore.  Pt has hx of afib, COPD/asthma.  12 lead by [facility] showed atrial fibrillation.  Pt describes chest activity as pressure like something sitting on her chest but denies true pain.  Pt took 1 NTG at home and 324 ASA.  [facility] gave 125 mg of Solumedrol and 5 mg albuterol.  Pt states currently her chest doesn't hurt and she can breathe better now.

## 2013-11-14 NOTE — ED Notes (Signed)
MD Manly at bedside. 

## 2013-11-14 NOTE — ED Notes (Signed)
Portable XR at bedside

## 2013-11-15 ENCOUNTER — Encounter (HOSPITAL_COMMUNITY): Payer: Self-pay | Admitting: Internal Medicine

## 2013-11-15 DIAGNOSIS — R0989 Other specified symptoms and signs involving the circulatory and respiratory systems: Secondary | ICD-10-CM | POA: Diagnosis present

## 2013-11-15 DIAGNOSIS — I714 Abdominal aortic aneurysm, without rupture, unspecified: Secondary | ICD-10-CM | POA: Diagnosis present

## 2013-11-15 DIAGNOSIS — I9589 Other hypotension: Secondary | ICD-10-CM | POA: Diagnosis not present

## 2013-11-15 DIAGNOSIS — E785 Hyperlipidemia, unspecified: Secondary | ICD-10-CM | POA: Diagnosis present

## 2013-11-15 DIAGNOSIS — I129 Hypertensive chronic kidney disease with stage 1 through stage 4 chronic kidney disease, or unspecified chronic kidney disease: Secondary | ICD-10-CM | POA: Diagnosis present

## 2013-11-15 DIAGNOSIS — I509 Heart failure, unspecified: Secondary | ICD-10-CM | POA: Diagnosis present

## 2013-11-15 DIAGNOSIS — J9600 Acute respiratory failure, unspecified whether with hypoxia or hypercapnia: Secondary | ICD-10-CM | POA: Diagnosis present

## 2013-11-15 DIAGNOSIS — R5381 Other malaise: Secondary | ICD-10-CM | POA: Diagnosis present

## 2013-11-15 DIAGNOSIS — I712 Thoracic aortic aneurysm, without rupture, unspecified: Secondary | ICD-10-CM | POA: Diagnosis present

## 2013-11-15 DIAGNOSIS — K5520 Angiodysplasia of colon without hemorrhage: Secondary | ICD-10-CM | POA: Diagnosis present

## 2013-11-15 DIAGNOSIS — I2510 Atherosclerotic heart disease of native coronary artery without angina pectoris: Secondary | ICD-10-CM | POA: Diagnosis present

## 2013-11-15 DIAGNOSIS — Z87891 Personal history of nicotine dependence: Secondary | ICD-10-CM | POA: Diagnosis not present

## 2013-11-15 DIAGNOSIS — I4891 Unspecified atrial fibrillation: Secondary | ICD-10-CM | POA: Diagnosis present

## 2013-11-15 DIAGNOSIS — Z951 Presence of aortocoronary bypass graft: Secondary | ICD-10-CM | POA: Diagnosis not present

## 2013-11-15 DIAGNOSIS — R0609 Other forms of dyspnea: Secondary | ICD-10-CM | POA: Diagnosis present

## 2013-11-15 DIAGNOSIS — I5033 Acute on chronic diastolic (congestive) heart failure: Secondary | ICD-10-CM | POA: Diagnosis present

## 2013-11-15 DIAGNOSIS — J441 Chronic obstructive pulmonary disease with (acute) exacerbation: Secondary | ICD-10-CM

## 2013-11-15 DIAGNOSIS — J449 Chronic obstructive pulmonary disease, unspecified: Secondary | ICD-10-CM | POA: Diagnosis present

## 2013-11-15 DIAGNOSIS — R000 Tachycardia, unspecified: Secondary | ICD-10-CM | POA: Diagnosis present

## 2013-11-15 DIAGNOSIS — Z7982 Long term (current) use of aspirin: Secondary | ICD-10-CM | POA: Diagnosis not present

## 2013-11-15 DIAGNOSIS — I872 Venous insufficiency (chronic) (peripheral): Secondary | ICD-10-CM | POA: Diagnosis present

## 2013-11-15 DIAGNOSIS — N183 Chronic kidney disease, stage 3 unspecified: Secondary | ICD-10-CM | POA: Diagnosis present

## 2013-11-15 DIAGNOSIS — E871 Hypo-osmolality and hyponatremia: Secondary | ICD-10-CM | POA: Diagnosis present

## 2013-11-15 DIAGNOSIS — Z9861 Coronary angioplasty status: Secondary | ICD-10-CM | POA: Diagnosis not present

## 2013-11-15 DIAGNOSIS — Z8249 Family history of ischemic heart disease and other diseases of the circulatory system: Secondary | ICD-10-CM | POA: Diagnosis not present

## 2013-11-15 DIAGNOSIS — Z9981 Dependence on supplemental oxygen: Secondary | ICD-10-CM | POA: Diagnosis not present

## 2013-11-15 DIAGNOSIS — I5032 Chronic diastolic (congestive) heart failure: Secondary | ICD-10-CM

## 2013-11-15 DIAGNOSIS — R0600 Dyspnea, unspecified: Secondary | ICD-10-CM | POA: Insufficient documentation

## 2013-11-15 DIAGNOSIS — I10 Essential (primary) hypertension: Secondary | ICD-10-CM | POA: Diagnosis not present

## 2013-11-15 LAB — COMPREHENSIVE METABOLIC PANEL
ALK PHOS: 85 U/L (ref 39–117)
ALT: 12 U/L (ref 0–35)
AST: 16 U/L (ref 0–37)
Albumin: 3.8 g/dL (ref 3.5–5.2)
Anion gap: 17 — ABNORMAL HIGH (ref 5–15)
BUN: 19 mg/dL (ref 6–23)
CALCIUM: 10.4 mg/dL (ref 8.4–10.5)
CHLORIDE: 96 meq/L (ref 96–112)
CO2: 30 meq/L (ref 19–32)
Creatinine, Ser: 1.11 mg/dL — ABNORMAL HIGH (ref 0.50–1.10)
GFR calc Af Amer: 52 mL/min — ABNORMAL LOW (ref >90)
GFR, EST NON AFRICAN AMERICAN: 45 mL/min — AB (ref >90)
GLUCOSE: 190 mg/dL — AB (ref 70–99)
POTASSIUM: 4.6 meq/L (ref 3.7–5.3)
Sodium: 143 mEq/L (ref 137–147)
Total Bilirubin: 0.3 mg/dL (ref 0.3–1.2)
Total Protein: 7.5 g/dL (ref 6.0–8.3)

## 2013-11-15 LAB — CBC WITH DIFFERENTIAL/PLATELET
BASOS PCT: 0 % (ref 0–1)
Basophils Absolute: 0 10*3/uL (ref 0.0–0.1)
EOS ABS: 0 10*3/uL (ref 0.0–0.7)
EOS PCT: 0 % (ref 0–5)
HCT: 39.1 % (ref 36.0–46.0)
Hemoglobin: 11.9 g/dL — ABNORMAL LOW (ref 12.0–15.0)
Lymphocytes Relative: 4 % — ABNORMAL LOW (ref 12–46)
Lymphs Abs: 0.4 10*3/uL — ABNORMAL LOW (ref 0.7–4.0)
MCH: 27.9 pg (ref 26.0–34.0)
MCHC: 30.4 g/dL (ref 30.0–36.0)
MCV: 91.8 fL (ref 78.0–100.0)
Monocytes Absolute: 0.1 10*3/uL (ref 0.1–1.0)
Monocytes Relative: 1 % — ABNORMAL LOW (ref 3–12)
Neutro Abs: 10.1 10*3/uL — ABNORMAL HIGH (ref 1.7–7.7)
Neutrophils Relative %: 95 % — ABNORMAL HIGH (ref 43–77)
PLATELETS: 207 10*3/uL (ref 150–400)
RBC: 4.26 MIL/uL (ref 3.87–5.11)
RDW: 13.9 % (ref 11.5–15.5)
WBC: 10.5 10*3/uL (ref 4.0–10.5)

## 2013-11-15 LAB — BASIC METABOLIC PANEL
Anion gap: 11 (ref 5–15)
BUN: 18 mg/dL (ref 6–23)
CALCIUM: 10 mg/dL (ref 8.4–10.5)
CO2: 31 mEq/L (ref 19–32)
CREATININE: 1.16 mg/dL — AB (ref 0.50–1.10)
Chloride: 100 mEq/L (ref 96–112)
GFR calc Af Amer: 49 mL/min — ABNORMAL LOW (ref >90)
GFR, EST NON AFRICAN AMERICAN: 43 mL/min — AB (ref >90)
GLUCOSE: 131 mg/dL — AB (ref 70–99)
Potassium: 4 mEq/L (ref 3.7–5.3)
Sodium: 142 mEq/L (ref 137–147)

## 2013-11-15 LAB — TROPONIN I
Troponin I: <0.3 ng/mL (ref <0.30)
Troponin I: <0.3 ng/mL (ref <0.30)
Troponin I: <0.3 ng/mL (ref <0.30)

## 2013-11-15 LAB — PRO B NATRIURETIC PEPTIDE: Pro B Natriuretic peptide (BNP): 1360 pg/mL — ABNORMAL HIGH (ref 0–450)

## 2013-11-15 MED ORDER — ONDANSETRON HCL 4 MG PO TABS: 4.0000 mg | ORAL_TABLET | Freq: Four times a day (QID) | ORAL | Status: DC | PRN

## 2013-11-15 MED ORDER — LEVALBUTEROL HCL 0.63 MG/3ML IN NEBU: 0.6300 mg | INHALATION_SOLUTION | Freq: Four times a day (QID) | RESPIRATORY_TRACT | Status: DC | PRN

## 2013-11-15 MED ORDER — ACETAMINOPHEN 325 MG PO TABS
650.0000 mg | ORAL_TABLET | Freq: Four times a day (QID) | ORAL | Status: DC | PRN
  Administered 2013-11-17: 650 mg via ORAL
  Filled 2013-11-15: qty 2

## 2013-11-15 MED ORDER — FUROSEMIDE 10 MG/ML IJ SOLN
40.0000 mg | Freq: Two times a day (BID) | INTRAMUSCULAR | Status: DC
  Administered 2013-11-15 – 2013-11-16 (×3): 40 mg via INTRAVENOUS
  Filled 2013-11-15 (×6): qty 4

## 2013-11-15 MED ORDER — ACETAMINOPHEN 650 MG RE SUPP: 650.0000 mg | Freq: Four times a day (QID) | RECTAL | Status: DC | PRN

## 2013-11-15 MED ORDER — ONDANSETRON HCL 4 MG/2ML IJ SOLN: 4.0000 mg | Freq: Four times a day (QID) | INTRAMUSCULAR | Status: DC | PRN

## 2013-11-15 MED ORDER — PREDNISONE 20 MG PO TABS: ORAL_TABLET | ORAL | Status: DC

## 2013-11-15 MED ORDER — METOPROLOL TARTRATE 12.5 MG HALF TABLET
12.5000 mg | ORAL_TABLET | Freq: Three times a day (TID) | ORAL | Status: DC
  Administered 2013-11-15 – 2013-11-16 (×4): 12.5 mg via ORAL
  Filled 2013-11-15 (×6): qty 1

## 2013-11-15 MED ORDER — SODIUM CHLORIDE 0.9 % IJ SOLN
3.0000 mL | Freq: Two times a day (BID) | INTRAMUSCULAR | Status: DC
  Administered 2013-11-15: 11:00:00 via INTRAVENOUS
  Administered 2013-11-15 – 2013-11-20 (×7): 3 mL via INTRAVENOUS

## 2013-11-15 MED ORDER — ENOXAPARIN SODIUM 40 MG/0.4ML ~~LOC~~ SOLN
40.0000 mg | SUBCUTANEOUS | Status: DC
  Administered 2013-11-15 – 2013-11-19 (×5): 40 mg via SUBCUTANEOUS
  Filled 2013-11-15 (×6): qty 0.4

## 2013-11-15 MED ORDER — FUROSEMIDE 10 MG/ML IJ SOLN
40.0000 mg | Freq: Once | INTRAMUSCULAR | Status: AC
  Administered 2013-11-15: 40 mg via INTRAVENOUS
  Filled 2013-11-15: qty 4

## 2013-11-15 MED ORDER — METOPROLOL TARTRATE 1 MG/ML IV SOLN
2.5000 mg | INTRAVENOUS | Status: DC | PRN
  Administered 2013-11-15: 2.5 mg via INTRAVENOUS
  Filled 2013-11-15: qty 5

## 2013-11-15 MED ORDER — IPRATROPIUM BROMIDE 0.02 % IN SOLN
0.5000 mg | Freq: Four times a day (QID) | RESPIRATORY_TRACT | Status: DC
  Administered 2013-11-15 (×2): 0.5 mg via RESPIRATORY_TRACT
  Filled 2013-11-15 (×2): qty 2.5

## 2013-11-15 MED ORDER — METHYLPREDNISOLONE SODIUM SUCC 125 MG IJ SOLR
125.0000 mg | Freq: Once | INTRAMUSCULAR | Status: AC
  Administered 2013-11-15: 125 mg via INTRAVENOUS
  Filled 2013-11-15: qty 2

## 2013-11-15 MED ORDER — IPRATROPIUM-ALBUTEROL 0.5-2.5 (3) MG/3ML IN SOLN
3.0000 mL | Freq: Three times a day (TID) | RESPIRATORY_TRACT | Status: DC
  Administered 2013-11-15 – 2013-11-18 (×8): 3 mL via RESPIRATORY_TRACT
  Filled 2013-11-15 (×5): qty 3
  Filled 2013-11-15: qty 12
  Filled 2013-11-15 (×2): qty 3

## 2013-11-15 MED ORDER — BISOPROLOL FUMARATE 10 MG PO TABS
10.0000 mg | ORAL_TABLET | Freq: Every day | ORAL | Status: DC
  Administered 2013-11-15: 10 mg via ORAL
  Filled 2013-11-15: qty 1

## 2013-11-15 MED ORDER — LEVALBUTEROL HCL 0.63 MG/3ML IN NEBU
0.6300 mg | INHALATION_SOLUTION | Freq: Four times a day (QID) | RESPIRATORY_TRACT | Status: DC
  Administered 2013-11-15 (×2): 0.63 mg via RESPIRATORY_TRACT
  Filled 2013-11-15 (×2): qty 3

## 2013-11-15 MED ORDER — POLYVINYL ALCOHOL 1.4 % OP SOLN: 2.0000 [drp] | Freq: Every day | OPHTHALMIC | Status: DC | PRN

## 2013-11-15 MED ORDER — SODIUM CHLORIDE 0.9 % IJ SOLN
3.0000 mL | Freq: Two times a day (BID) | INTRAMUSCULAR | Status: DC
  Administered 2013-11-16 – 2013-11-20 (×4): 3 mL via INTRAVENOUS

## 2013-11-15 MED ORDER — ATORVASTATIN CALCIUM 20 MG PO TABS
20.0000 mg | ORAL_TABLET | Freq: Every day | ORAL | Status: DC
  Administered 2013-11-15 – 2013-11-19 (×5): 20 mg via ORAL
  Filled 2013-11-15 (×6): qty 1

## 2013-11-15 MED ORDER — ASPIRIN EC 81 MG PO TBEC
81.0000 mg | DELAYED_RELEASE_TABLET | Freq: Every day | ORAL | Status: DC
  Administered 2013-11-15 – 2013-11-20 (×6): 81 mg via ORAL
  Filled 2013-11-15 (×6): qty 1

## 2013-11-15 MED ORDER — PANTOPRAZOLE SODIUM 40 MG PO TBEC
40.0000 mg | DELAYED_RELEASE_TABLET | Freq: Every day | ORAL | Status: DC
  Administered 2013-11-15 – 2013-11-19 (×5): 40 mg via ORAL
  Filled 2013-11-15 (×6): qty 1

## 2013-11-15 MED ORDER — NITROGLYCERIN 0.4 MG SL SUBL
0.4000 mg | SUBLINGUAL_TABLET | SUBLINGUAL | Status: DC | PRN
Start: 2013-11-15 — End: 2013-11-20

## 2013-11-15 MED ORDER — BUDESONIDE 0.25 MG/2ML IN SUSP
0.2500 mg | Freq: Two times a day (BID) | RESPIRATORY_TRACT | Status: DC
  Administered 2013-11-15 – 2013-11-19 (×9): 0.25 mg via RESPIRATORY_TRACT
  Filled 2013-11-15 (×13): qty 2

## 2013-11-15 MED ORDER — IPRATROPIUM-ALBUTEROL 0.5-2.5 (3) MG/3ML IN SOLN: 3.0000 mL | RESPIRATORY_TRACT | Status: DC | PRN

## 2013-11-15 MED ORDER — POTASSIUM CHLORIDE CRYS ER 20 MEQ PO TBCR
20.0000 meq | EXTENDED_RELEASE_TABLET | Freq: Two times a day (BID) | ORAL | Status: DC
  Administered 2013-11-15 – 2013-11-20 (×11): 20 meq via ORAL
  Filled 2013-11-15 (×14): qty 1

## 2013-11-15 MED ORDER — DILTIAZEM HCL ER COATED BEADS 360 MG PO CP24
360.0000 mg | ORAL_CAPSULE | Freq: Every day | ORAL | Status: DC
  Administered 2013-11-15 – 2013-11-16 (×2): 360 mg via ORAL
  Filled 2013-11-15 (×3): qty 1

## 2013-11-15 NOTE — Progress Notes (Signed)
10:09 AM I agree with HPI/GPe and A/P per Dr. Toniann FailKakrakandy  80 y/o ?, known h/o chronic tobacco use, severe COPD O2 dependent, bilateral SFA stenosis, CABG X. 5005, BMS 2014, GI bleed from AVM, thoracic aneurysm, chronic hyponatremia, carotid disease chronic diastolic failure on echo prior to 07/19/12 admitted [birth year] and with acute shortness of breath, found to be in rapid A. fib   Her meds were recently changed at Cardiology office and her cardizem was increased to 360 daily She has had increasing episodes of fluttering in her chest of late past 1-2 months She has also had inc Le swelling prompting increasing use of prn lasix, which she stopped on 11/10/13 She has been eating salty foods        Patient Active Problem List   Diagnosis Date Noted  . Dyspnea 11/15/2013  . Atrial fibrillation with tachycardic ventricular rate 11/15/2013  . Peripheral arterial disease 10/17/2013  . Acute respiratory failure 02/09/2013  . Atypical chest pain 02/08/2013  . Cellulitis, leg 09/28/2012  . Leg ulcer   . CAD (coronary artery disease)   . Hematoma   . Mitral regurgitation   . Chronic diastolic CHF (congestive heart failure) 07/20/2012  . Anemia 07/20/2012  . Intermediate coronary syndrome 07/18/2012  . Acute exacerbation of chronic obstructive pulmonary disease (COPD) 02/15/2012  . Dehydration 02/12/2012  . Alkalosis, metabolic 02/12/2012  . Generalized weakness 02/11/2012  . Atrial fibrillation   . Chronic respiratory failure 07/29/2011  . H/O steroid therapy 07/28/2011  . Edema   . Aortic valve sclerosis   . Hypertension   . Hyperlipidemia   . GI bleed   . Aneurysm, aortic   . Syncope   . Hyponatremia   . COPD (chronic obstructive pulmonary disease)   . Tobacco abuse   . Bradycardia   . Hx of CABG   . Ejection fraction   . Carotid artery disease   . Tuberculosis   . OXYGEN-USE OF SUPPLEMENTAL 08/02/2009   Acute decompensated diastolic hf-contiue iv lasix 40 bid.  Labs am.  i/o  -620 so far.  Daily weights.   Afib c RVR-worsened by #1 continue cardizem 360 daily.  Added bisoprolol 10 mg daily this am and prn metoprolol iv 2.5 mg.  If hr remains elevated, will probably  to diltiazem gtt.  Have added scheudled metoprolol 12.5 tid to see if this controls her rate End stage copd-on 2 l oxygen at home- at baseline-no acute component.  D/c steroids.  Continue levalbuterol/atrovent/budesonide. S/p CABG-continue asa 81 mg daily.  Continue bb if tolerated H/o hyponatremia ckd stage 3-monitor renal function in am   Pleas KochJai Reyanna Baley, MD [facility] [phone]

## 2013-11-15 NOTE — H&P (Addendum)
[facility] History and Physical  [name]:[id number] DOB: 1930-04-04 DOA: 11/14/2013  Referring physician: ER physician. PCP: Kaleen Mask, MD   Chief Complaint: Shortness of breath.  HPI: [name] is a 80 y.o. female with history of CHF last EF measured March 2014 was 55-60%, atrial fibrillation not on Coumadin secondary to GI bleed, COPD on home oxygen, hypertension, CAD status post CABG and stents presents to the ER because of increasing shortness of breath. Patient has been feeling progressively increasingly short of breath over the last few weeks. Last 2 days it acutely worsened. Patient has been recently to the cardiology office last week and her Cardizem dose was increased due to increased heart rate. Patient also asked 3 days has decreased her Lasix dose 40 mg twice a day to once a day. Patient noticed that she has started increasing lower extremity edema and her shortness of breath worsened. Patient also had some chest tightness and palpitations. In the ER EKG showed A. fib with increased heart rate and chest x-ray did not show anything acute. Patient has been admitted for shortness of breath most likely from CHF and A. fib. Patient also has chronic COPD. Patient denies any nausea vomiting abdominal pain fever chills productive cough diarrhea.   Review of Systems: As presented in the history of presenting illness, rest negative.  Past Medical History  Diagnosis Date  . CAD (coronary artery disease)     a. s/p CABG x5 (2005) b. s/p BMS-prox SVG-D2 (2014)  . Hypertension   . Hyperlipidemia   . GI bleed     AVMs  . Aneurysm, aortic     thoracic aorta, stable at 4.1 cm, chest CT, May, 2012  . Syncope     Nitroglycerin plus a diuretic, April, 2009  . Hyponatremia     Chronic. Felt secondary to SIADH   . COPD (chronic obstructive pulmonary disease)   . Tobacco abuse   . Bradycardia   . Carotid artery disease     Hx of endarterectomy. Doppler  October, 2011, stable, 0-39% RIC A., 40-59% LICA  . Tuberculosis     Exposures to tuberculosis 1970s, has tested negative by the health Department  . Atrial fibrillation     Paroxysmal with RVR 07/2011, not felt to be a coumadin candidate secondary to hx of GIB/AVM  . Venous stasis of lower extremity     Chronic  . Ejection fraction     a. EF 55-60%, echo, April, 2013 b. EF 55-60%, mild biatrial enlargement and PASP 42 mmH  . CHF with left ventricular diastolic dysfunction, NYHA class 2   . AAA (abdominal aortic aneurysm)   . On home O2     2L N/C  . Complication of anesthesia   . Shortness of breath   . Hx of CABG     CABG 2005  . Hematoma     Right groin hematoma, small, post cath, April, 2014  . Mitral regurgitation     Mild, hospital, March, 2014  . Leg ulcer     Ulcerated lesion on the anterior aspect of her left lower leg, June, 2014   Past Surgical History  Procedure Laterality Date  . Cholecystectomy    . Carotid endartercetomy    . Abdominal hysterectomy    . Coronary artery bypass graft  2005  . Coronary angioplasty with stent placement  07/18/12    severe native coronary artery disease, 99% proximal stenosis of the SVG-D2 status post successful PTCA/PCI with a  Veriflex bare-metal stent, widely patent SVGs to both the right PDA sequential OM1 to OM 2, EF 65-70% and 3+ mitral regurgitation   Social History:  reports that she quit smoking about 9 months ago. Her smoking use included Cigarettes. She has a 29.5 pack-year smoking history. She does not have any smokeless tobacco history on file. She reports that she does not drink alcohol. Her drug history is not on file. Where does patient live home. Can patient participate in ADLs? Yes.  Allergies  Allergen Reactions  . Other Other (See Comments)    TETANUS-can only take 1/2 dose at one time, can take the other 1/2 dose about 3 days later  . Codeine Nausea And Vomiting  . Sulfonamide Derivatives Nausea And Vomiting  .  Penicillins Itching, Rash and Other (See Comments)    Nasal itching   . Ranitidine Hcl Itching and Rash    Family History:  Family History  Problem Relation Age of Onset  . Stroke Sister   . Heart failure Mother   . Cancer Sister     Breast and Lung   . Cancer Brother     breast cancer      Prior to Admission medications   Medication Sig Start Date End Date Taking? Authorizing Provider  acetaminophen (TYLENOL) 500 MG tablet Take 1,000 mg by mouth every 6 (six) hours as needed for moderate pain.   Yes Historical Provider, MD  albuterol (PROVENTIL HFA;VENTOLIN HFA) 108 (90 BASE) MCG/ACT inhaler Inhale 2 puffs into the lungs every 6 (six) hours as needed for wheezing or shortness of breath.   Yes Historical Provider, MD  albuterol (PROVENTIL) (2.5 MG/3ML) 0.083% nebulizer solution Take 2.5 mg by nebulization every 6 (six) hours as needed for wheezing.   Yes Historical Provider, MD  aspirin EC 81 MG tablet Take 81 mg by mouth daily.   Yes Historical Provider, MD  atorvastatin (LIPITOR) 40 MG tablet Take 0.5 tablets (20 mg total) by mouth daily at 6 PM. 11/07/13  Yes Rosalio Macadamia, NP  bisoprolol (ZEBETA) 10 MG tablet Take 10 mg by mouth daily.   Yes Historical Provider, MD  budesonide-formoterol (SYMBICORT) 160-4.5 MCG/ACT inhaler Inhale 2 puffs into the lungs 2 (two) times daily.   Yes Historical Provider, MD  diltiazem (CARDIZEM CD) 360 MG 24 hr capsule Take 1 capsule (360 mg total) by mouth daily. 11/07/13  Yes Rosalio Macadamia, NP  furosemide (LASIX) 40 MG tablet Take 1 tablet (40 mg total) by mouth 2 (two) times daily. 08/29/13  Yes Luis Abed, MD  IPRATROPIUM BROMIDE NA Place 2 sprays into the nose daily as needed (for stuffiness/wheezing).    Yes Historical Provider, MD  Multiple Vitamins-Minerals (PRESERVISION AREDS 2 PO) Take 1 tablet by mouth 2 (two) times daily.   Yes Historical Provider, MD  nitroGLYCERIN (NITROSTAT) 0.4 MG SL tablet Place 0.4 mg under the tongue every 5 (five)  minutes as needed for chest pain.   Yes Historical Provider, MD  pantoprazole (PROTONIX) 40 MG tablet Take 40 mg by mouth daily.   Yes Historical Provider, MD  Polyethyl Glycol-Propyl Glycol (SYSTANE) 0.4-0.3 % SOLN Apply 2 drops to eye daily as needed (for dry eyes).   Yes Historical Provider, MD  potassium chloride SA (K-DUR,KLOR-CON) 20 MEQ tablet Take 20 mEq by mouth 2 (two) times daily.   Yes Historical Provider, MD  tiotropium (SPIRIVA) 18 MCG inhalation capsule Place 18 mcg into inhaler and inhale daily.    Yes Historical Provider, MD  Vitamin D, Ergocalciferol, (DRISDOL) 50000 UNITS CAPS Take 50,000 Units by mouth every 14 (fourteen) days. Every other Sunday   Yes Historical Provider, MD  predniSONE (DELTASONE) 20 MG tablet 3 tabs po day one, then 2 po daily x 4 days 11/15/13   Brandt Loosen, MD    Physical Exam: Filed Vitals:   11/15/13 0130 11/15/13 0200 11/15/13 0300 11/15/13 0432  BP: 115/49 130/67 128/57 146/83  Pulse: 103 95 54 115  Temp:    97.4 F (36.3 C)  TempSrc:    Oral  Resp: 26 19 33 20  SpO2: 97% 99% 99% 98%     General:  Moderately built and nourished.  Eyes: Anicteric no pallor.  ENT: No discharge from ears eyes nose mouth.  Neck: Elevated JVD. No mass felt.  Cardiovascular: S1-S2 heard irregular tachycardic.  Respiratory: No wheezing. Mild crepitations.  Abdomen: Soft nontender bowel sounds present no guarding rigidity.  Skin: No rash.  Musculoskeletal: Mild edema bilateral lower extremity.  Psychiatric: Appears normal.  Neurologic: Alert and oriented to time place and person. Moves all extremities.  Labs on Admission:  Basic Metabolic Panel:  Recent Labs Lab 11/14/13 2307  NA 142  K 4.0  CL 100  CO2 31  GLUCOSE 131*  BUN 18  CREATININE 1.16*  CALCIUM 10.0   Liver Function Tests: No results found for this basename: AST, ALT, ALKPHOS, BILITOT, PROT, ALBUMIN,  in the last 168 hours No results found for this basename: LIPASE, AMYLASE,   in the last 168 hours No results found for this basename: AMMONIA,  in the last 168 hours CBC:  Recent Labs Lab 11/14/13 2307  WBC 7.6  HGB 11.6*  HCT 37.7  MCV 92.4  PLT 177   Cardiac Enzymes: No results found for this basename: CKTOTAL, CKMB, CKMBINDEX, TROPONINI,  in the last 168 hours  BNP (last 3 results)  Recent Labs  04/28/13 1857 11/07/13 1459 11/14/13 2307  PROBNP 458.0* 181.0* 1360.0*   CBG: No results found for this basename: GLUCAP,  in the last 168 hours  Radiological Exams on Admission: Dg Chest Port 1 View  11/14/2013   CLINICAL DATA:  Shortness of breath with cough and wheezing. History of COPD.  EXAM: PORTABLE CHEST - 1 VIEW  COMPARISON:  Radiographs 04/28/2013 and 02/08/2013.  FINDINGS: 2320 hr. The heart size and mediastinal contours are stable status post median sternotomy and CABG. Patient is rotated to the left. There is stable chronic lung disease with hyperinflation and scattered scarring. A small subpleural nodular density in the right upper lobe appears stable. There is no significant pleural effusion.  IMPRESSION: Stable chronic lung disease post CABG. No acute cardiopulmonary process demonstrated.   Electronically Signed   By: Roxy Horseman M.D.   On: 11/14/2013 23:32    EKG: Independently reviewed. A. fib with RVR with ST depression in the anterior leads.  Assessment/Plan Principal Problem:   Acute respiratory failure Active Problems:   COPD (chronic obstructive pulmonary disease)   Chronic diastolic CHF (congestive heart failure)   Atrial fibrillation with tachycardic ventricular rate   1. Acute respiratory failure - most likely secondary to CHF (diastolic) probably contributed by A. fib with RVR. Patient also has COPD. Patient has received Lasix 40 mg IV in the ER. I have written for another 40 mg IV Lasix followed by every 12 hourly. I also ordered patient's Bisoprolol 10 mg by mouth one dose now as patient's heart rate is still elevated. If  heart rate does not  get better then may need Cardizem infusion. Continue nebulizer and Pulmicort. Closely follow intake output and metabolic panel and daily weights. Patient's last EF was 55-60%. 2. Atrial fibrillation with RVR - probably causing #1. Not a Coumadin candidate secondary to GI bleed secondary to AVMs. See #1. 3. CAD status post CABG status post stenting - patient has some chest tightness probably related to heart rate. Cycle cardiac markers. Patient is on aspirin. Nitroglycerin when necessary. 4. COPD - place patient on nebulizer and Pulmicort. 5. Hypertension - continue present medications. 6. Hyperlipidemia - continue statins. 7. History of thoracic aortic aneurysm - patient does not have any persistent pain. Closely observe.    Code Status: Full code.  Family Communication: None.  Disposition Plan: Admit to inpatient.    Jenel Gierke N. [facility] Pager [phone].  If 7PM-7AM, please contact night-coverage [email or web address] Password [email or web address] 11/15/2013, 6:14 AM

## 2013-11-15 NOTE — ED Provider Notes (Signed)
CSN: [id number]     Arrival date & time 11/14/13  2224 History   First MD Initiated Contact with Patient 11/14/13 2314     Chief Complaint  Patient presents with  . Chest Pain  . Shortness of Breath     (Consider location/radiation/quality/duration/timing/severity/associated sxs/prior Treatment) HPI  This patient is an 80 yo woman with oxygen dependent COPD who is s/p 5v CABG, remotely. She has multiple co morbidities and comes in today with SOb and wheezing.  She has felt increasingly limited by SOB and wheezing in her daily activities for the past couple of weeks. She becomes SOB with ambulting just a few feet. She denies cough.   When her wheezing worsened and she became SOB tonight, she developed associated chest pressure. This resolved when the patient was treated with Albuterol. The patient is now s/p 2nd neb and is feeling much better. Says her breathing is back to baseline and she is not SOB and does not have chest pain.   Past Medical History  Diagnosis Date  . CAD (coronary artery disease)     a. s/p CABG x5 (2005) b. s/p BMS-prox SVG-D2 (2014)  . Hypertension   . Hyperlipidemia   . GI bleed     AVMs  . Aneurysm, aortic     thoracic aorta, stable at 4.1 cm, chest CT, May, 2012  . Syncope     Nitroglycerin plus a diuretic, April, 2009  . Hyponatremia     Chronic. Felt secondary to SIADH   . COPD (chronic obstructive pulmonary disease)   . Tobacco abuse   . Bradycardia   . Carotid artery disease     Hx of endarterectomy. Doppler October, 2011, stable, 0-39% RIC A., 40-59% LICA  . Tuberculosis     Exposures to tuberculosis 1970s, has tested negative by the health Department  . Atrial fibrillation     Paroxysmal with RVR 07/2011, not felt to be a coumadin candidate secondary to hx of GIB/AVM  . Venous stasis of lower extremity     Chronic  . Ejection fraction     a. EF 55-60%, echo, April, 2013 b. EF 55-60%, mild biatrial enlargement and PASP 42 mmH  . CHF with left  ventricular diastolic dysfunction, NYHA class 2   . AAA (abdominal aortic aneurysm)   . On home O2     2L N/C  . Complication of anesthesia   . Shortness of breath   . Hx of CABG     CABG 2005  . Hematoma     Right groin hematoma, small, post cath, April, 2014  . Mitral regurgitation     Mild, hospital, March, 2014  . Leg ulcer     Ulcerated lesion on the anterior aspect of her left lower leg, June, 2014   Past Surgical History  Procedure Laterality Date  . Cholecystectomy    . Carotid endartercetomy    . Abdominal hysterectomy    . Coronary artery bypass graft  2005  . Coronary angioplasty with stent placement  07/18/12    severe native coronary artery disease, 99% proximal stenosis of the SVG-D2 status post successful PTCA/PCI with a Veriflex bare-metal stent, widely patent SVGs to both the right PDA sequential OM1 to OM 2, EF 65-70% and 3+ mitral regurgitation   Family History  Problem Relation Age of Onset  . Stroke Sister   . Heart failure Mother   . Cancer Sister     Breast and Lung   . Cancer Brother  breast cancer   History  Substance Use Topics  . Smoking status: Former Smoker -- 0.50 packs/day for 59 years    Types: Cigarettes    Quit date: 02/01/2013  . Smokeless tobacco: Not on file  . Alcohol Use: No   OB History   Grav Para Term Preterm Abortions TAB SAB Ect Mult Living                 Review of Systems  Ten point review of symptoms performed and is negative with the exception of symptoms noted above.   Allergies  Other; Codeine; Sulfonamide derivatives; Penicillins; and Ranitidine hcl  Home Medications   Prior to Admission medications   Medication Sig Start Date End Date Taking? Authorizing Provider  acetaminophen (TYLENOL) 500 MG tablet Take 1,000 mg by mouth every 6 (six) hours as needed for moderate pain.   Yes Historical Provider, MD  albuterol (PROVENTIL HFA;VENTOLIN HFA) 108 (90 BASE) MCG/ACT inhaler Inhale 2 puffs into the lungs every  6 (six) hours as needed for wheezing or shortness of breath.   Yes Historical Provider, MD  albuterol (PROVENTIL) (2.5 MG/3ML) 0.083% nebulizer solution Take 2.5 mg by nebulization every 6 (six) hours as needed for wheezing.   Yes Historical Provider, MD  aspirin EC 81 MG tablet Take 81 mg by mouth daily.   Yes Historical Provider, MD  atorvastatin (LIPITOR) 40 MG tablet Take 0.5 tablets (20 mg total) by mouth daily at 6 PM. 11/07/13  Yes Rosalio Macadamia, NP  bisoprolol (ZEBETA) 10 MG tablet Take 10 mg by mouth daily.   Yes Historical Provider, MD  budesonide-formoterol (SYMBICORT) 160-4.5 MCG/ACT inhaler Inhale 2 puffs into the lungs 2 (two) times daily.   Yes Historical Provider, MD  diltiazem (CARDIZEM CD) 360 MG 24 hr capsule Take 1 capsule (360 mg total) by mouth daily. 11/07/13  Yes Rosalio Macadamia, NP  furosemide (LASIX) 40 MG tablet Take 1 tablet (40 mg total) by mouth 2 (two) times daily. 08/29/13  Yes Luis Abed, MD  IPRATROPIUM BROMIDE NA Place 2 sprays into the nose daily as needed (for stuffiness/wheezing).    Yes Historical Provider, MD  Multiple Vitamins-Minerals (PRESERVISION AREDS 2 PO) Take 1 tablet by mouth 2 (two) times daily.   Yes Historical Provider, MD  nitroGLYCERIN (NITROSTAT) 0.4 MG SL tablet Place 0.4 mg under the tongue every 5 (five) minutes as needed for chest pain.   Yes Historical Provider, MD  pantoprazole (PROTONIX) 40 MG tablet Take 40 mg by mouth daily.   Yes Historical Provider, MD  Polyethyl Glycol-Propyl Glycol (SYSTANE) 0.4-0.3 % SOLN Apply 2 drops to eye daily as needed (for dry eyes).   Yes Historical Provider, MD  potassium chloride SA (K-DUR,KLOR-CON) 20 MEQ tablet Take 20 mEq by mouth 2 (two) times daily.   Yes Historical Provider, MD  tiotropium (SPIRIVA) 18 MCG inhalation capsule Place 18 mcg into inhaler and inhale daily.    Yes Historical Provider, MD  Vitamin D, Ergocalciferol, (DRISDOL) 50000 UNITS CAPS Take 50,000 Units by mouth every 14 (fourteen)  days. Every other Sunday   Yes Historical Provider, MD   BP 137/68  Pulse 98  Temp(Src) 97.9 F (36.6 C) (Oral)  Resp 17  SpO2 100% Physical Exam Gen: well developed and well nourished appearing Head: NCAT Eyes: PERL, EOMI Nose: no epistaixis or rhinorrhea Mouth/throat: mucosa is moist and pink Neck: supple, no stridor Lungs: CTA B, no wheezing, rhonchi or rales CV: RRR, no murmur, extremities appear well  perfused.  Abd: soft, notender, nondistended Back: no ttp, no cva ttp Skin: warm and dry Ext: normal to inspection, no dependent edema Neuro: CN ii-xii grossly intact, no focal deficits Psyche; normal affect,  calm and cooperative.  ED Course  Procedures (including critical care time) Labs Review  Results for orders placed during the hospital encounter of 11/14/13 (from the past 24 hour(s))  CBC     Status: Abnormal   Collection Time    11/14/13 11:07 PM      Result Value Ref Range   WBC 7.6  4.0 - 10.5 K/uL   RBC 4.08  3.87 - 5.11 MIL/uL   Hemoglobin 11.6 (*) 12.0 - 15.0 g/dL   HCT 16.137.7  09.636.0 - 04.546.0 %   MCV 92.4  78.0 - 100.0 fL   MCH 28.4  26.0 - 34.0 pg   MCHC 30.8  30.0 - 36.0 g/dL   RDW 40.913.7  81.111.5 - 91.415.5 %   Platelets 177  150 - 400 K/uL  BASIC METABOLIC PANEL     Status: Abnormal   Collection Time    11/14/13 11:07 PM      Result Value Ref Range   Sodium 142  137 - 147 mEq/L   Potassium 4.0  3.7 - 5.3 mEq/L   Chloride 100  96 - 112 mEq/L   CO2 31  19 - 32 mEq/L   Glucose, Bld 131 (*) 70 - 99 mg/dL   BUN 18  6 - 23 mg/dL   Creatinine, Ser 7.821.16 (*) 0.50 - 1.10 mg/dL   Calcium 95.610.0  8.4 - 21.310.5 mg/dL   GFR calc non Af Amer 43 (*) >90 mL/min   GFR calc Af Amer 49 (*) >90 mL/min   Anion gap 11  5 - 15  PRO B NATRIURETIC PEPTIDE     Status: Abnormal   Collection Time    11/14/13 11:07 PM      Result Value Ref Range   Pro B Natriuretic peptide (BNP) 1360.0 (*) 0 - 450 pg/mL  I-STAT TROPOININ, ED     Status: None   Collection Time    11/14/13 11:14 PM       Result Value Ref Range   Troponin i, poc 0.01  0.00 - 0.08 ng/mL   Comment 3             Imaging Review Dg Chest Port 1 View  11/14/2013   CLINICAL DATA:  Shortness of breath with cough and wheezing. History of COPD.  EXAM: PORTABLE CHEST - 1 VIEW  COMPARISON:  Radiographs 04/28/2013 and 02/08/2013.  FINDINGS: 2320 hr. The heart size and mediastinal contours are stable status post median sternotomy and CABG. Patient is rotated to the left. There is stable chronic lung disease with hyperinflation and scattered scarring. A small subpleural nodular density in the right upper lobe appears stable. There is no significant pleural effusion.  IMPRESSION: Stable chronic lung disease post CABG. No acute cardiopulmonary process demonstrated.   Electronically Signed   By: Roxy HorsemanBill  Veazey M.D.   On: 11/14/2013 23:32    EKG: AF.no acute ischemic changes, normal intervals, normal axis, normal qrs complex  MDM   Patient with increasingly severe DOE which is likely multifactorial and secondary to COPD, diastolic heart failure, CAD and rapid atrial fibrillation. Her dose of Cardizem was increased from 240mg  to 320mg  last week by Cardiology MLP.  The patient says this has not helped and, indeed, she continues to have RVR with rate in the  110s, here in the ED.  She feels somewhat improved after an Albuterol neb but is uncomfortable with the idea of going home. Case discussed with Dr. Rueben Bash who will see and admit.    Brandt Loosen, MD 11/15/13 [phone]

## 2013-11-15 NOTE — Progress Notes (Signed)
Utilization Review Completed.[name] T7/22/2015  

## 2013-11-16 LAB — COMPREHENSIVE METABOLIC PANEL
ALT: 12 U/L (ref 0–35)
AST: 13 U/L (ref 0–37)
Albumin: 3.2 g/dL — ABNORMAL LOW (ref 3.5–5.2)
Alkaline Phosphatase: 67 U/L (ref 39–117)
Anion gap: 9 (ref 5–15)
BUN: 31 mg/dL — ABNORMAL HIGH (ref 6–23)
CALCIUM: 9.7 mg/dL (ref 8.4–10.5)
CO2: 33 mEq/L — ABNORMAL HIGH (ref 19–32)
Chloride: 97 mEq/L (ref 96–112)
Creatinine, Ser: 1.24 mg/dL — ABNORMAL HIGH (ref 0.50–1.10)
GFR calc Af Amer: 46 mL/min — ABNORMAL LOW (ref >90)
GFR, EST NON AFRICAN AMERICAN: 39 mL/min — AB (ref >90)
Glucose, Bld: 138 mg/dL — ABNORMAL HIGH (ref 70–99)
Potassium: 4.5 mEq/L (ref 3.7–5.3)
SODIUM: 139 meq/L (ref 137–147)
Total Bilirubin: <0.2 mg/dL — ABNORMAL LOW (ref 0.3–1.2)
Total Protein: 6 g/dL (ref 6.0–8.3)

## 2013-11-16 LAB — CBC
HCT: 31.8 % — ABNORMAL LOW (ref 36.0–46.0)
Hemoglobin: 10 g/dL — ABNORMAL LOW (ref 12.0–15.0)
MCH: 28.4 pg (ref 26.0–34.0)
MCHC: 31.4 g/dL (ref 30.0–36.0)
MCV: 90.3 fL (ref 78.0–100.0)
PLATELETS: 189 10*3/uL (ref 150–400)
RBC: 3.52 MIL/uL — AB (ref 3.87–5.11)
RDW: 13.9 % (ref 11.5–15.5)
WBC: 11.8 10*3/uL — ABNORMAL HIGH (ref 4.0–10.5)

## 2013-11-16 MED ORDER — DILTIAZEM HCL ER COATED BEADS 300 MG PO CP24
300.0000 mg | ORAL_CAPSULE | Freq: Every day | ORAL | Status: DC
  Filled 2013-11-16: qty 1

## 2013-11-16 MED ORDER — METOPROLOL SUCCINATE ER 50 MG PO TB24
50.0000 mg | ORAL_TABLET | Freq: Every day | ORAL | Status: DC
  Filled 2013-11-16: qty 1

## 2013-11-16 MED ORDER — FUROSEMIDE 40 MG PO TABS
40.0000 mg | ORAL_TABLET | Freq: Two times a day (BID) | ORAL | Status: DC
  Administered 2013-11-16 – 2013-11-17 (×2): 40 mg via ORAL
  Filled 2013-11-16 (×4): qty 1

## 2013-11-16 MED ORDER — METOPROLOL SUCCINATE ER 25 MG PO TB24
25.0000 mg | ORAL_TABLET | Freq: Every day | ORAL | Status: DC
  Administered 2013-11-16 – 2013-11-20 (×5): 25 mg via ORAL
  Filled 2013-11-16 (×5): qty 1

## 2013-11-16 NOTE — Progress Notes (Signed)
Clinical Social Work Department CLINICAL SOCIAL WORK PLACEMENT NOTE 11/16/2013  Patient:  [name],[name]  Account Number:  [id number] Admit date:  11/14/2013  Clinical Social Worker:  Carren RangLINDSAY Degan Hanser, LCSWA  Date/time:  11/16/2013 03:39 PM  Clinical Social Work is seeking post-discharge placement for this patient at the following level of care:   SKILLED NURSING   (*CSW will update this form in Epic as items are completed)   11/16/2013  [facility]/family provided with [facility] of [facility] Work'[name] list of facilities offering this level of care within the geographic area requested by the patient (or if unable, by the patient'[name] family).  11/16/2013  Patient/family informed of their freedom to choose among providers that offer the needed level of care, that participate in Medicare, Medicaid or managed care program needed by the patient, have an available bed and are willing to accept the patient.  11/16/2013  Patient/family informed of [facility]' ownership interest in [facility], as well as of the fact that they are under no obligation to receive care at this facility.  PASARR submitted to EDS on  PASARR number received on   FL2 transmitted to all facilities in geographic area requested by pt/family on  11/16/2013 FL2 transmitted to all facilities within larger geographic area on   Patient informed that his/her managed care company has contracts with or will negotiate with  certain facilities, including the following:     Patient/family informed of bed offers received:   Patient chooses bed at  Physician recommends and patient chooses bed at    Patient to be transferred to  on   Patient to be transferred to facility by  Patient and family notified of transfer on  Name of family member notified:    The following physician request were entered in Epic:   Additional Comments:  Maree KrabbeLindsay Maudie Shingledecker, MSW, [employer]CSWA [phone]

## 2013-11-16 NOTE — Progress Notes (Signed)
Clinical Social Work Department BRIEF PSYCHOSOCIAL ASSESSMENT 11/16/2013  Patient:  [name], [name]     Account Number:  [id number]     Admit date:  11/14/2013  Clinical Social Worker:  Megan Salon  Date/Time:  11/16/2013 03:36 PM  Referred by:  Care Management  Date Referred:  11/16/2013 Referred for  SNF Placement   Other Referral:   Interview type:  Patient Other interview type:    PSYCHOSOCIAL DATA Living Status:  ALONE Admitted from facility:   Level of care:   Primary support name:  Delorse Lek Primary support relationship to patient:  CHILD, ADULT Degree of support available:   Good    CURRENT CONCERNS Current Concerns  Post-Acute Placement   Other Concerns:    SOCIAL WORK ASSESSMENT / PLAN Clinical Social Worker received referral for SNF placement at d/c. Clinical Social Worker met with patient at bedside to offer support and discuss patient needs at discharge.  CSW introduced self and explained reason for visit.  CSW explained SNF process to patient. Patient reported she is agreeable for SNF placement and prefers [facility]. Patient states she has been there before. Patient state social worker does not need to call family, patient will update daughter when daughter calls.. CSW will complete FL2 for MD's signature and will update patient and family when bed offers are received.    CSW remains available for support and to facilitate patient discharge needs once medically ready.   Assessment/plan status:  Psychosocial Support/Ongoing Assessment of Needs Other assessment/ plan:   Information/referral to community resources:   SNF information    PATIENT'S/FAMILY'S RESPONSE TO PLAN OF CARE: Patient states that she has been to [facility] before and would like to go back there.        Jeanette Caprice, MSW, [place]

## 2013-11-16 NOTE — Care Management Note (Addendum)
    Page 1 of 1   11/21/2013     4:51:02 PM CARE MANAGEMENT NOTE 11/21/2013  Patient:  [name],[name]   Account Number:  [id number]  Date Initiated:  11/16/2013  Documentation initiated by:  MAYO,HENRIETTA  Subjective/Objective Assessment:   dx diastolic failure; lives alone    PCP  Windle GuardWilson Elkins     Action/Plan:   Anticipated DC Date:  11/20/2013   Anticipated DC Plan:  SKILLED NURSING FACILITY  In-house referral  Clinical Social Worker         Choice offered to / List presented to:             Status of service:  Completed, signed off Medicare Important Message given?  YES (If response is "NO", the following Medicare IM given date fields will be blank) Date Medicare IM given:  11/17/2013 Medicare IM given by:  MAYO,HENRIETTA Date Additional Medicare IM given:  11/20/2013 Additional Medicare IM given by:  Goble Fudala  Discharge Disposition:  SKILLED NURSING FACILITY  Per UR Regulation:  Reviewed for med. necessity/level of care/duration of stay  If discussed at Long Length of Stay Meetings, dates discussed:    Comments:  11/20/13 Sidney AceJulie Jeanine Caven, RN, BSN [phone] Pt discharged to SNF today, per CSW arrangements.   11/16/13 1530 Henrietta Mayo RN MSN BSN CCM PT recommends 24/7 supervision - discussed with pt who states her family works and no one will be able to stay with her.  Also states she was concerned re her ability to remain home alone before hospitalization due to her weakness/SOB.  Has been @ [facility] previously for rehab and agrees that she needs ST-SNF for rehab - would prefer [facility].  CSW consult placed.

## 2013-11-16 NOTE — Progress Notes (Signed)
Note: This document was prepared with digital dictation and possible smart phrase technology. Any transcriptional errors that result from this process are unintentional.   [name] ZOX:[id number] DOB: 1929/07/11 DOA: 11/14/2013 PCP: Kaleen Mask, MD  Brief narrative:  80 y/o ?, known h/o chronic tobacco use, severe COPD O2 dependent, bilateral SFA stenosis, CABG X. 5005, BMS 2014, GI bleed from AVM, thoracic aneurysm, chronic hyponatremia, carotid disease chronic diastolic failure on echo prior to 07/19/12 admitted 11/16/78 and with acute shortness of breath, found to be in rapid A. fib  Her meds were recently changed at Cardiology office and her cardizem was increased to 360 daily  She has had increasing episodes of fluttering in her chest of late past 1-2 months  She has also had inc Le swelling prompting increasing use of prn lasix, which she stopped on 11/10/13  She has been eating salty foods    Past medical history-As per Problem list Chart reviewed as below- Reviewed   Consultants:  None  Procedures:  None  Antibiotics:  None   Subjective   Doing fair. Did not sleep well last night Tolerating diet fairly Multiple cups and jugular fluid in room States mouth is dry Felt a little dizzy this morning with ambulation Still slight heaviness in the chest but this is improving    Objective    Interim History:   Telemetry: H. of fibrillation rate controlled currently artery showing mainly PVCs   Objective: Filed Vitals:   11/15/13 2035 11/16/13 0415 11/16/13 0652 11/16/13 1040  BP: 113/58 109/59  111/64  Pulse: 79 75  95  Temp: 97.9 F (36.6 C) 97.6 F (36.4 C)    TempSrc: Oral Oral    Resp: 18 18    Height:      Weight:   86 kg (189 lb 9.5 oz)   SpO2: 99% 98%      Intake/Output Summary (Last 24 hours) at 11/16/13 1151 Last data filed at 11/16/13 0700  Gross per 24 hour  Intake    600 ml  Output    800 ml  Net   -200 ml     Exam:  General: Alert pleasant but tired today Cardiovascular: S1-S2 irregularly irregular Respiratory: Clinically clear Abdomen: Soft nontender nondistended no rebound Skin no lower extremity edema stasis dermatitis with some excoriation   Data Reviewed: Basic Metabolic Panel:  Recent Labs Lab 11/14/13 2307 11/15/13 0715 11/16/13 0215  NA 142 143 139  K 4.0 4.6 4.5  CL 100 96 97  CO2 31 30 33*  GLUCOSE 131* 190* 138*  BUN 18 19 31*  CREATININE 1.16* 1.11* 1.24*  CALCIUM 10.0 10.4 9.7   Liver Function Tests:  Recent Labs Lab 11/15/13 0715 11/16/13 0215  AST 16 13  ALT 12 12  ALKPHOS 85 67  BILITOT 0.3 <0.2*  PROT 7.5 6.0  ALBUMIN 3.8 3.2*   No results found for this basename: LIPASE, AMYLASE,  in the last 168 hours No results found for this basename: AMMONIA,  in the last 168 hours CBC:  Recent Labs Lab 11/14/13 2307 11/15/13 0715 11/16/13 0215  WBC 7.6 10.5 11.8*  NEUTROABS  --  10.1*  --   HGB 11.6* 11.9* 10.0*  HCT 37.7 39.1 31.8*  MCV 92.4 91.8 90.3  PLT 177 207 189   Cardiac Enzymes:  Recent Labs Lab 11/15/13 0715 11/15/13 1153 11/15/13 1855  TROPONINI <0.30 <0.30 <0.30   BNP: No components found with this basename: POCBNP,  CBG: No results  found for this basename: GLUCAP,  in the last 168 hours  No results found for this or any previous visit (from the past 240 hour(s)).   Studies:              All Imaging reviewed and is as per above notation   Scheduled Meds: . aspirin EC  81 mg Oral Daily  . atorvastatin  20 mg Oral q1800  . budesonide (PULMICORT) nebulizer solution  0.25 mg Nebulization BID  . [START ON 11/17/2013] diltiazem  300 mg Oral Daily  . enoxaparin (LOVENOX) injection  40 mg Subcutaneous Q24H  . furosemide  40 mg Oral BID  . ipratropium-albuterol  3 mL Nebulization TID  . metoprolol succinate  50 mg Oral Daily  . pantoprazole  40 mg Oral Daily  . potassium chloride SA  20 mEq Oral BID  . sodium chloride  3 mL  Intravenous Q12H  . sodium chloride  3 mL Intravenous Q12H   Continuous Infusions:    Assessment/Plan: Acute decompensated diastolic hf-transition iv lasix 40 bid 2 by mouth 40 twice a da as labs show a decrease in GFR slightly. i/o -  1.1 L so far. Daily weights.  Afib c RVR-worsened by #1-decrease Cardizem dose 360-->300,  metoprolol 12.5 3 times a day to 25 mg XL daily and reassess in a.m. Iatrogenic hypotension secondary to medications-see above End stage copd-on 2 l oxygen at home- at baseline-no acute component. D/c steroids. Continue levalbuterol/atrovent/budesonide.  Mild glucose intolerance-up is secondary to Solu-Medrol. Outpatient A1c S/p CABG-continue asa 81 mg daily. Continue bb if tolerated  H/o hyponatremia  ckd stage 3-monitor renal function in am Hyperlipidemia-continue atorvastatin 20 daily   Code Status:  full  Family Communication:  none present at bedside Disposition Plan: inpatient    Pleas KochJai Copeland Neisen, MD  [facility] Pager [phone] 11/16/2013, 11:51 AM    LOS: 2 days

## 2013-11-16 NOTE — Evaluation (Signed)
Physical Therapy Evaluation Patient Details Name: [name] MRN: [id number] DOB: Apr 01, 1930 Today's Date: 11/16/2013   History of Present Illness  Pt admit with CP and SOB.    Clinical Impression  Pt admitted with above. Pt currently with functional limitations due to the deficits listed below (see PT Problem List).  Pt will benefit from skilled PT to increase their independence and safety with mobility to allow discharge to the venue listed below.     Follow Up Recommendations Home health PT;Supervision/Assistance - 24 hour    Equipment Recommendations  None recommended by PT    Recommendations for Other Services       Precautions / Restrictions Precautions Precautions: Fall Restrictions Weight Bearing Restrictions: No      Mobility  Bed Mobility Overal bed mobility: Independent                Transfers Overall transfer level: Independent                  Ambulation/Gait Ambulation/Gait assistance: Min guard Ambulation Distance (Feet): 45 Feet Assistive device: None Gait Pattern/deviations: Step-through pattern;Decreased stride length   Gait velocity interpretation: Below normal speed for age/gender General Gait Details: Pt able to ambulate without device.  Ambulated 10 feet and stated she was dizzy.  BP does drop a little with sit to stand.  Pt then ambulated 35 feet after that.  Overall fairly steady with gait with pt holding onto furniture at times.    Stairs            Wheelchair Mobility    Modified Rankin (Stroke Patients Only)       Balance Overall balance assessment: Needs assistance;History of Falls Sitting-balance support: No upper extremity supported;Feet supported Sitting balance-Leahy Scale: Good     Standing balance support: No upper extremity supported;During functional activity Standing balance-Leahy Scale: Fair Standing balance comment: can stand statically and maintain balance without UE support .                             Pertinent Vitals/Pain Pt sat 95% on 3L.  HR 84-117bpm with activity.  Dizzy after standing so took BPin sitting at 124/62 and in standing 111/67.  No pain.     Home Living Family/patient expects to be discharged to:: Private residence Living Arrangements: Alone Available Help at Discharge: Available PRN/intermittently Type of Home: House Home Access: Ramped entrance     Home Layout: One level Home Equipment: Cane - single point;Hospital bed;Walker - 2 wheels;Walker - standard;Walker - 4 wheels;Bedside commode;Shower seat;Grab bars - tub/shower (2LO2 at home) Additional Comments: daughter in law does pt errands and cleaning.      Prior Function Level of Independence: Independent with assistive device(s)         Comments: uses rollator with seat     Hand Dominance   Dominant Hand: Right    Extremity/Trunk Assessment   Upper Extremity Assessment: Defer to OT evaluation           Lower Extremity Assessment: Generalized weakness         Communication   Communication: No difficulties  Cognition Arousal/Alertness: Awake/alert Behavior During Therapy: WFL for tasks assessed/performed Overall Cognitive Status: Within Functional Limits for tasks assessed                      General Comments      Exercises General Exercises - Lower Extremity Ankle Circles/Pumps: AROM;Both;10 reps;Seated  Long Arc Quad: AROM;Both;10 reps;Seated Hip Flexion/Marching: AROM;Both;10 reps;Seated      Assessment/Plan    PT Assessment Patient needs continued PT services  PT Diagnosis Generalized weakness   PT Problem List Decreased strength;Decreased activity tolerance;Decreased balance;Decreased mobility;Decreased knowledge of use of DME;Decreased safety awareness;Decreased knowledge of precautions  PT Treatment Interventions DME instruction;Gait training;Functional mobility training;Therapeutic activities;Therapeutic exercise;Balance  training;Patient/family education   PT Goals (Current goals can be found in the Care Plan section) Acute Rehab PT Goals Patient Stated Goal: to go home PT Goal Formulation: With patient Time For Goal Achievement: 11/23/13 Potential to Achieve Goals: Good    Frequency Min 3X/week   Barriers to discharge Decreased caregiver support      Co-evaluation               End of Session Equipment Utilized During Treatment: Gait belt;Oxygen Activity Tolerance: Patient limited by fatigue Patient left: in chair;with call bell/phone within reach;with nursing/sitter in room Nurse Communication: Mobility status         Time: [id number] PT Time Calculation (min): 22 min   Charges:   PT Evaluation $Initial PT Evaluation Tier I: 1 Procedure PT Treatments $Gait Training: 8-22 mins   PT G Codes:          INGOLD,Traniece Boffa 11/16/2013, 11:44 AM Audree Camelawn Ingold,PT Acute Rehabilitation [phone] (pager)

## 2013-11-16 NOTE — Progress Notes (Signed)
PT attempted to ambulate with patient earlier, however she reported feeling dizzy and did not make it outside of the room. RN ambulated with patient, patient was only able to ambulate approx. 75 feet (up to the nurses's station), patient complained of her legs "feeling weak" and needed to get back to the room. Patient was SOB with activity, O2 SATS were stable at 94%. HR elevated to 105 with ambulation activity.   PT recommending 24-hour supervision, however, patient lives alone. Patient states that her daughter-in-law comes through the day and helps her with ADL's, but is not there 24 hours. Will follow up with case management and Social work.   Will continue to encourage activity throughout the day, trying to walk further with each walk as tolerated. Rise PaganiniURRY, Matis Monnier R, RN

## 2013-11-17 LAB — BASIC METABOLIC PANEL
Anion gap: 11 (ref 5–15)
BUN: 29 mg/dL — AB (ref 6–23)
CO2: 33 meq/L — AB (ref 19–32)
Calcium: 9.5 mg/dL (ref 8.4–10.5)
Chloride: 94 mEq/L — ABNORMAL LOW (ref 96–112)
Creatinine, Ser: 1.22 mg/dL — ABNORMAL HIGH (ref 0.50–1.10)
GFR calc Af Amer: 46 mL/min — ABNORMAL LOW (ref >90)
GFR calc non Af Amer: 40 mL/min — ABNORMAL LOW (ref >90)
Glucose, Bld: 110 mg/dL — ABNORMAL HIGH (ref 70–99)
Potassium: 3.9 mEq/L (ref 3.7–5.3)
Sodium: 138 mEq/L (ref 137–147)

## 2013-11-17 MED ORDER — PREDNISONE 20 MG PO TABS
40.0000 mg | ORAL_TABLET | Freq: Every day | ORAL | Status: DC
  Administered 2013-11-18 – 2013-11-20 (×3): 40 mg via ORAL
  Filled 2013-11-17 (×4): qty 2

## 2013-11-17 MED ORDER — DILTIAZEM HCL 60 MG PO TABS
60.0000 mg | ORAL_TABLET | Freq: Once | ORAL | Status: AC
  Administered 2013-11-17: 60 mg via ORAL
  Filled 2013-11-17: qty 1

## 2013-11-17 MED ORDER — FUROSEMIDE 10 MG/ML IJ SOLN
40.0000 mg | Freq: Once | INTRAMUSCULAR | Status: AC
  Administered 2013-11-17: 40 mg via INTRAVENOUS
  Filled 2013-11-17: qty 4

## 2013-11-17 MED ORDER — DILTIAZEM HCL ER COATED BEADS 240 MG PO CP24
240.0000 mg | ORAL_CAPSULE | Freq: Every day | ORAL | Status: DC
  Administered 2013-11-17: 240 mg via ORAL
  Filled 2013-11-17 (×2): qty 1

## 2013-11-17 MED ORDER — DILTIAZEM HCL ER COATED BEADS 300 MG PO CP24
300.0000 mg | ORAL_CAPSULE | Freq: Every day | ORAL | Status: DC
  Administered 2013-11-18 – 2013-11-20 (×3): 300 mg via ORAL
  Filled 2013-11-17 (×3): qty 1

## 2013-11-17 NOTE — Progress Notes (Addendum)
Patient had a 2.12 sec pause. Patient asymptomatic when I entered the room. Notified Dr. Mahala MenghiniSamtani regarding the pause. He stated to notify attending regarding a pause of 3 sec or greater.

## 2013-11-17 NOTE — Progress Notes (Signed)
Note: This document was prepared with digital dictation and possible smart phrase technology. Any transcriptional errors that result from this process are unintentional.   [name] JXB:[id number] DOB: 08/07/1929 DOA: 11/14/2013 PCP: [name],[name], MD  Brief narrative:  80 y/o ?, known h/o chronic tobacco use, severe COPD O2 dependent, bilateral SFA stenosis, CABG X. 5005, BMS 2014, GI bleed from AVM, thoracic aneurysm, chronic hyponatremia, carotid disease chronic diastolic failure on echo prior to 07/19/12 admitted 11/16/80 and with acute shortness of breath, found to be in rapid A. fib  Her meds were recently changed at Cardiology office and her cardizem was increased to 360 daily  She has had increasing episodes of fluttering in her chest of late past 1-2 months  She has also had inc Le swelling prompting increasing use of prn lasix, which she stopped on 11/10/13  She has been eating salty foods    Past medical history-As per Problem list Chart reviewed as below- Reviewed   Consultants:  None  Procedures:  None  Antibiotics:  None   Subjective   Doing fair.  Felt history of Crohn but this morning feels more short of breath Has not ambulated much and feels shortness of breath with ambulation Orthostatics are pending She does not feel as dizzy but has not been up and about much today   Objective    Interim History:   Telemetry: A fibrillation rate controlled currently artery showing mainly PVCs Some Tachy   Objective: Filed Vitals:   11/16/13 2049 11/17/13 0720 11/17/13 0723 11/17/13 0852  BP: 93/43  133/60   Pulse: 86  88   Temp: 97.6 F (36.4 C)  97.9 F (36.6 C)   TempSrc: Oral  Oral   Resp: 20  19   Height:      Weight:  86.5 kg (190 lb 11.2 oz)    SpO2: 98%  95% 95%    Intake/Output Summary (Last 24 hours) at 11/17/13 1055 Last data filed at 11/17/13 0700  Gross per 24 hour  Intake    660 ml  Output    525 ml  Net    135 ml     Exam:  General: Alert pleasant but tired today Cardiovascular: S1-S2 irregularly irregular Respiratory: Clinically clear Abdomen: Soft nontender nondistended no rebound Skin no lower extremity edema stasis dermatitis with some excoriation   Data Reviewed: Basic Metabolic Panel:  Recent Labs Lab 11/14/13 2307 11/15/13 0715 11/16/13 0215 11/17/13 0410  NA 142 143 139 138  K 4.0 4.6 4.5 3.9  CL 100 96 97 94*  CO2 31 30 33* 33*  GLUCOSE 131* 190* 138* 110*  BUN 18 19 31* 29*  CREATININE 1.16* 1.11* 1.24* 1.22*  CALCIUM 10.0 10.4 9.7 9.5   Liver Function Tests:  Recent Labs Lab 11/15/13 0715 11/16/13 0215  AST 16 13  ALT 12 12  ALKPHOS 85 67  BILITOT 0.3 <0.2*  PROT 7.5 6.0  ALBUMIN 3.8 3.2*   No results found for this basename: LIPASE, AMYLASE,  in the last 168 hours No results found for this basename: AMMONIA,  in the last 168 hours CBC:  Recent Labs Lab 11/14/13 2307 11/15/13 0715 11/16/13 0215  WBC 7.6 10.5 11.8*  NEUTROABS  --  10.1*  --   HGB 11.6* 11.9* 10.0*  HCT 37.7 39.1 31.8*  MCV 92.4 91.8 90.3  PLT 177 207 189   Cardiac Enzymes:  Recent Labs Lab 11/15/13 0715 11/15/13 1153 11/15/13 1855  TROPONINI <0.30 <0.30 <  0.30   BNP: No components found with this basename: POCBNP,  CBG: No results found for this basename: GLUCAP,  in the last 168 hours  No results found for this or any previous visit (from the past 240 hour(s)).   Studies:              All Imaging reviewed and is as per above notation   Scheduled Meds: . aspirin EC  81 mg Oral Daily  . atorvastatin  20 mg Oral q1800  . budesonide (PULMICORT) nebulizer solution  0.25 mg Nebulization BID  . diltiazem  240 mg Oral Daily  . enoxaparin (LOVENOX) injection  40 mg Subcutaneous Q24H  . furosemide  40 mg Intravenous Once  . ipratropium-albuterol  3 mL Nebulization TID  . metoprolol succinate  25 mg Oral Daily  . pantoprazole  40 mg Oral Daily  . potassium chloride SA  20  mEq Oral BID  . sodium chloride  3 mL Intravenous Q12H  . sodium chloride  3 mL Intravenous Q12H   Continuous Infusions:    Assessment/Plan: Acute decompensated diastolic hf-baseline weight from office records 80 kg-current weight 85 KG. i/o -  1.18 L so far.  Needs further IV diuresis-changed back to IV lasix 7.24  Monitor I/o and weights and kidney function Afib c RVR-worsened by #1-decrease Cardizem dose 360-->240,  metoprolol 12.5 3 times a day to 25 mg XL daily and reassess in a.m.  assess orthostatics Iatrogenic hypotension secondary to medications-see above End stage copd-on 2 l oxygen at home- at baseline-no acute component. D/c steroids initally-might have COPD component-short burst of prednisone 40 daily Continue levalbuterol/atrovent/budesonide.  Mild glucose intolerance-up is secondary to Solu-Medrol. Outpatient A1c S/p CABG-continue asa 81 mg daily. Continue bb if tolerated  H/o hyponatremia  ckd stage 3-monitor renal function in am Hyperlipidemia-continue atorvastatin 20 daily   Code Status:  full  Family Communication:  none present at bedside-states she will update Disposition Plan: could d/c to [facility] over weekend if ready   Pleas Koch, MD  [facility] Pager [phone] 11/17/2013, 10:55 AM    LOS: 3 days

## 2013-11-17 NOTE — Progress Notes (Signed)
Patient running A-fib, heart rate ranging from 100-128, up to 140s, nonsustained.  Patient asymptomatic, sitting and eating lunch in room.  Patient has received all scheduled medications.  Dr. Mahala MenghiniSamtani notified. New orders received; will continue to monitor.

## 2013-11-17 NOTE — Progress Notes (Signed)
CSW provided bed offers to patient. Patient states she would like to go with [facility] at dc. CSW asked patient if social worker could call family to inform them of plans. Patient states no she will speak to her family to inform them of going to [facility] at dc. CSW continues to follow.  Maree KrabbeLindsay Lasondra Hodgkins, MSW, Theresia MajorsLCSWA [phone]

## 2013-11-18 LAB — BASIC METABOLIC PANEL
Anion gap: 10 (ref 5–15)
BUN: 27 mg/dL — AB (ref 6–23)
CHLORIDE: 96 meq/L (ref 96–112)
CO2: 33 meq/L — AB (ref 19–32)
Calcium: 9.6 mg/dL (ref 8.4–10.5)
Creatinine, Ser: 1.23 mg/dL — ABNORMAL HIGH (ref 0.50–1.10)
GFR calc Af Amer: 46 mL/min — ABNORMAL LOW (ref >90)
GFR calc non Af Amer: 40 mL/min — ABNORMAL LOW (ref >90)
Glucose, Bld: 111 mg/dL — ABNORMAL HIGH (ref 70–99)
Potassium: 4.2 mEq/L (ref 3.7–5.3)
Sodium: 139 mEq/L (ref 137–147)

## 2013-11-18 MED ORDER — FUROSEMIDE 10 MG/ML IJ SOLN
40.0000 mg | Freq: Once | INTRAMUSCULAR | Status: AC
  Administered 2013-11-18: 40 mg via INTRAVENOUS
  Filled 2013-11-18: qty 4

## 2013-11-18 MED ORDER — IPRATROPIUM-ALBUTEROL 0.5-2.5 (3) MG/3ML IN SOLN
3.0000 mL | Freq: Two times a day (BID) | RESPIRATORY_TRACT | Status: DC
  Administered 2013-11-18 – 2013-11-20 (×4): 3 mL via RESPIRATORY_TRACT
  Filled 2013-11-18 (×4): qty 3

## 2013-11-18 NOTE — Progress Notes (Signed)
Note: This document was prepared with digital dictation and possible smart phrase technology. Any transcriptional errors that result from this process are unintentional.   [name] VWU:[id number] DOB: October 04, 1929 DOA: 11/14/2013 PCP: Kaleen Mask, MD  Brief narrative:  80 y/o ?, known h/o chronic tobacco use, severe COPD O2 dependent, bilateral SFA stenosis, CABG X. 5005, BMS 2014, GI bleed from AVM, thoracic aneurysm, chronic hyponatremia, carotid disease chronic diastolic failure on echo prior to 07/19/12 admitted 11/16/80 and with acute shortness of breath, found to be in rapid A. fib  Her meds were recently changed at Cardiology office and her cardizem was increased to 360 daily  She has had increasing episodes of fluttering in her chest of late past 1-2 months  She has also had inc Le swelling prompting increasing use of prn lasix, which she stopped on 11/10/13  She has been eating salty foods. Her Afib was thought to be driven by deompensated chf and cardiology was consulted to help with management   Past medical history-As per Problem list Chart reviewed as below- Reviewed   Consultants:  None  Procedures:  None  Antibiotics:  None   Subjective   SOB about the same No improvement with prednisone No cp.  tol diet fairly well   Objective    Interim History:   Telemetry: A fibrillation rate controlled currently artery showing mainly PVCs Some Tachy   Objective: Filed Vitals:   11/17/13 2037 11/18/13 0500 11/18/13 0611 11/18/13 0900  BP:   131/50 105/54  Pulse: 74  75 104  Temp:   98.1 F (36.7 C) 97 F (36.1 C)  TempSrc:   Oral Oral  Resp: 16  19 18   Height:      Weight:  85.4 kg (188 lb 4.4 oz)    SpO2: 97%  95% 95%    Intake/Output Summary (Last 24 hours) at 11/18/13 1259 Last data filed at 11/18/13 0900  Gross per 24 hour  Intake    462 ml  Output   1400 ml  Net   -938 ml    Exam:  General: Alert pleasant but tired  today Cardiovascular: S1-S2 irregularly irregular Respiratory: Clinically clear Abdomen: Soft nontender nondistended no rebound Skin no lower extremity edema stasis dermatitis with some excoriation   Data Reviewed: Basic Metabolic Panel:  Recent Labs Lab 11/14/13 2307 11/15/13 0715 11/16/13 0215 11/17/13 0410 11/18/13 0354  NA 142 143 139 138 139  K 4.0 4.6 4.5 3.9 4.2  CL 100 96 97 94* 96  CO2 31 30 33* 33* 33*  GLUCOSE 131* 190* 138* 110* 111*  BUN 18 19 31* 29* 27*  CREATININE 1.16* 1.11* 1.24* 1.22* 1.23*  CALCIUM 10.0 10.4 9.7 9.5 9.6   Liver Function Tests:  Recent Labs Lab 11/15/13 0715 11/16/13 0215  AST 16 13  ALT 12 12  ALKPHOS 85 67  BILITOT 0.3 <0.2*  PROT 7.5 6.0  ALBUMIN 3.8 3.2*   No results found for this basename: LIPASE, AMYLASE,  in the last 168 hours No results found for this basename: AMMONIA,  in the last 168 hours CBC:  Recent Labs Lab 11/14/13 2307 11/15/13 0715 11/16/13 0215  WBC 7.6 10.5 11.8*  NEUTROABS  --  10.1*  --   HGB 11.6* 11.9* 10.0*  HCT 37.7 39.1 31.8*  MCV 92.4 91.8 90.3  PLT 177 207 189   Cardiac Enzymes:  Recent Labs Lab 11/15/13 0715 11/15/13 1153 11/15/13 1855  TROPONINI <0.30 <0.30 <0.30   BNP:  No components found with this basename: POCBNP,  CBG: No results found for this basename: GLUCAP,  in the last 168 hours  No results found for this or any previous visit (from the past 240 hour(s)).   Studies:              All Imaging reviewed and is as per above notation   Scheduled Meds: . aspirin EC  81 mg Oral Daily  . atorvastatin  20 mg Oral q1800  . budesonide (PULMICORT) nebulizer solution  0.25 mg Nebulization BID  . diltiazem  300 mg Oral Daily  . enoxaparin (LOVENOX) injection  40 mg Subcutaneous Q24H  . ipratropium-albuterol  3 mL Nebulization BID  . metoprolol succinate  25 mg Oral Daily  . pantoprazole  40 mg Oral Daily  . potassium chloride SA  20 mEq Oral BID  . predniSONE  40 mg Oral  QAC breakfast  . sodium chloride  3 mL Intravenous Q12H  . sodium chloride  3 mL Intravenous Q12H   Continuous Infusions:    Assessment/Plan: Acute decompensated diastolic hf-baseline weight from office records 80 kg-current weight 85 KG. i/o -  3.12 L so far.  Needs further IV diuresis-changed back to IV lasix 7.24  Monitor I/o and weights and kidney function Afib c RVR-worsened by #1-decrease Cardizem dose 360-->240,  metoprolol 12.5 3 times a day to 25 mg XL daily and reassess in a.m.   Orthostatics + so difficult situation with rate control  Cardiology has been consulted to help with input for both Afib and CHF on 11/18/13  Iatrogenic hypotension secondary to medications-see above End stage copd-on 2 l oxygen at home- at baseline-no acute component. D/c'd steroids initally-might have COPD component-short burst of prednisone 40 daily started 7/24 Continue levalbuterol/atrovent/budesonide.  Mild glucose intolerance-secondary to steroids. Outpatient A1c S/p CABG-continue asa 81 mg daily. Continue bb if tolerated  H/o hyponatremia  ckd stage 3-kidney function holding stable-monitor renal function in am Hyperlipidemia-continue atorvastatin 20 daily   Code Status:  full  Family Communication:  none present at bedside-states she will update Disposition Plan: likely d/c to [facility] 48-72 hrs   Pleas KochJai Audryanna Zurita, MD  [facility] Pager [phone] 11/18/2013, 12:59 PM    LOS: 4 days

## 2013-11-18 NOTE — Consult Note (Signed)
CARDIOLOGY CONSULT NOTE   Patient ID: [name] MRN: [id number], DOB/AGE: 1929/05/26   Admit date: 11/14/2013 Date of Consult: 11/18/2013   Primary Physician: Karen Mask, MD Primary Cardiologist: Dr. Myrtis [name]  Pt. Profile  80 year old woman admitted with worsening dyspnea.  History of chronic atrial fibrillation not on anticoagulation because of GI bleeding.  Problem List  Past Medical History  Diagnosis Date  . CAD (coronary artery disease)     a. s/p CABG x5 (2005) b. s/p BMS-prox SVG-D2 (2014)  . Hypertension   . Hyperlipidemia   . GI bleed     AVMs  . Aneurysm, aortic     thoracic aorta, stable at 4.1 cm, chest CT, May, 2012  . Syncope     Nitroglycerin plus a diuretic, April, 2009  . Hyponatremia     Chronic. Felt secondary to SIADH   . COPD (chronic obstructive pulmonary disease)   . Tobacco abuse   . Bradycardia   . Carotid artery disease     Hx of endarterectomy. Doppler October, 2011, stable, 0-39% RIC A., 40-59% LICA  . Tuberculosis     Exposures to tuberculosis 1970s, has tested negative by the health Department  . Atrial fibrillation     Paroxysmal with RVR 07/2011, not felt to be a coumadin candidate secondary to hx of GIB/AVM  . Venous stasis of lower extremity     Chronic  . Ejection fraction     a. EF 55-60%, echo, April, 2013 b. EF 55-60%, mild biatrial enlargement and PASP 42 mmH  . CHF with left ventricular diastolic dysfunction, NYHA class 2   . AAA (abdominal aortic aneurysm)   . On home O2     2L N/C  . Complication of anesthesia   . Shortness of breath   . Hx of CABG     CABG 2005  . Hematoma     Right groin hematoma, small, post cath, April, 2014  . Mitral regurgitation     Mild, hospital, March, 2014  . Leg ulcer     Ulcerated lesion on the anterior aspect of her left lower leg, June, 2014    Past Surgical History  Procedure Laterality Date  . Cholecystectomy    . Carotid endartercetomy    . Abdominal hysterectomy      . Coronary artery bypass graft  2005  . Coronary angioplasty with stent placement  07/18/12    severe native coronary artery disease, 99% proximal stenosis of the SVG-D2 status post successful PTCA/PCI with a Veriflex bare-metal stent, widely patent SVGs to both the right PDA sequential OM1 to OM 2, EF 65-70% and 3+ mitral regurgitation     Allergies  Allergies  Allergen Reactions  . Other Other (See Comments)    TETANUS-can only take 1/2 dose at one time, can take the other 1/2 dose about 3 days later  . Codeine Nausea And Vomiting  . Sulfonamide Derivatives Nausea And Vomiting  . Penicillins Itching, Rash and Other (See Comments)    Nasal itching   . Ranitidine Hcl Itching and Rash    HPI  This 80 year old woman was admitted on 11/14/13 for worsening dyspnea.  She has a history of known ischemic heart disease.  She is status post CABG in 2005 and had a bare-metal stent to the saphenous vein graft to the D2 in 2014.  She has not been experiencing any recurrent chest pain or angina.  He has been in chronic atrial fibrillation.  She has a history of  GI bleeding secondary to AV malformations and is not on anticoagulation other than aspirin.  She has had problems with worsening exertional dyspnea.  She was recently seen in the office by Karen FredricksonLori Gerhardt RN, ANP and at that time the patient complained of persistent dyspnea and she felt that her heart was racing at the time.  Her pulse was 102 at rest and her diltiazem was increased up to 360 mg a day and her losartan was stopped to allow room for the blood pressure to come up.  It was noted that she was noted to limited from her COPD.  The patient has chronic diastolic heart failure and was to continue her Lasix 40 mg twice a day.  Since that visit her symptoms failed to improve and she was subsequently seen in the emergency room and admitted.  Since admission the patient has had good and bad days.  She stated that yesterday her breathing appeared to be  improved and she was able to walk to the nurses station and back without much difficulty.  Today she has felt more dyspneic.  Her weight is essentially unchanged since admission at 188 pounds.  Her chest x-ray does not show CHF although her heart is enlarged.  Her proBNP on admission was elevated at 1360.  Her troponins are negative for myocardial injury.  Inpatient Medications  . aspirin EC  81 mg Oral Daily  . atorvastatin  20 mg Oral q1800  . budesonide (PULMICORT) nebulizer solution  0.25 mg Nebulization BID  . diltiazem  300 mg Oral Daily  . enoxaparin (LOVENOX) injection  40 mg Subcutaneous Q24H  . ipratropium-albuterol  3 mL Nebulization BID  . metoprolol succinate  25 mg Oral Daily  . pantoprazole  40 mg Oral Daily  . potassium chloride SA  20 mEq Oral BID  . predniSONE  40 mg Oral QAC breakfast  . sodium chloride  3 mL Intravenous Q12H  . sodium chloride  3 mL Intravenous Q12H    Family History Family History  Problem Relation Age of Onset  . Stroke Sister   . Heart failure Mother   . Cancer Sister     Breast and Lung   . Cancer Brother     breast cancer     Social History History   Social History  . Marital Status: Widowed    Spouse Name: N/A    Number of Children: 1  . Years of Education: N/A   Occupational History  . retired     [employer]   Social History Main Topics  . Smoking status: Former Smoker -- 0.50 packs/day for 59 years    Types: Cigarettes    Quit date: 02/01/2013  . Smokeless tobacco: Not on file  . Alcohol Use: No  . Drug Use: Not on file  . Sexual Activity: Not on file   Other Topics Concern  . Not on file   Social History Narrative  . No narrative on file     Review of Systems  General:  No chills, fever, night sweats or weight changes.  Cardiovascular:  No chest pain, dyspnea on exertion, edema, orthopnea, palpitations, paroxysmal nocturnal dyspnea. Dermatological: No rash, lesions/masses Respiratory: No cough, dyspnea Urologic:  No hematuria, dysuria Abdominal:   No nausea, vomiting, diarrhea, bright red blood per rectum, melena, or hematemesis Neurologic:  No visual changes, wkns, changes in mental status. All other systems reviewed and are otherwise negative except as noted above.  Physical Exam  Blood pressure 136/64, pulse 105,  temperature 99 F (37.2 C), temperature source Oral, resp. rate 18, height 5\' 10"  (1.778 m), weight 188 lb 4.4 oz (85.4 kg), SpO2 97.00%.  General: Pleasant, NAD, nasal oxygen in place Psych: Normal affect. Neuro: Alert and oriented X 3. Moves all extremities spontaneously. HEENT: Normal  Neck: Supple without bruits or JVD. Lungs:  Increased AP diameter.  Distant breath sounds. Heart: Irregularly irregular pulse.  Distant heart tones.  No murmur gallop or rub. Abdomen: Soft, non-tender, non-distended, BS + x 4.  Extremities: No clubbing, cyanosis or edema. DP/PT/Radials 2+ and equal bilaterally.  Labs   Recent Labs  11/15/13 1855  TROPONINI <0.30   Lab Results  Component Value Date   WBC 11.8* 11/16/2013   HGB 10.0* 11/16/2013   HCT 31.8* 11/16/2013   MCV 90.3 11/16/2013   PLT 189 11/16/2013    Recent Labs Lab 11/16/13 0215  11/18/13 0354  NA 139  < > 139  K 4.5  < > 4.2  CL 97  < > 96  CO2 33*  < > 33*  BUN 31*  < > 27*  CREATININE 1.24*  < > 1.23*  CALCIUM 9.7  < > 9.6  PROT 6.0  --   --   BILITOT <0.2*  --   --   ALKPHOS 67  --   --   ALT 12  --   --   AST 13  --   --   GLUCOSE 138*  < > 111*  < > = values in this interval not displayed. Lab Results  Component Value Date   CHOL 121 07/19/2012   HDL 39* 07/19/2012   LDLCALC 62 07/19/2012   TRIG 102 07/19/2012   Lab Results  Component Value Date   DDIMER  Value: 0.29        AT THE INHOUSE ESTABLISHED CUTOFF VALUE OF 0.48 ug/mL FEU, THIS ASSAY HAS BEEN DOCUMENTED IN THE LITERATURE TO HAVE 01/23/2007    Radiology/Studies  Dg Chest Port 1 View  11/14/2013   CLINICAL DATA:  Shortness of breath with cough and  wheezing. History of COPD.  EXAM: PORTABLE CHEST - 1 VIEW  COMPARISON:  Radiographs 04/28/2013 and 02/08/2013.  FINDINGS: 2320 hr. The heart size and mediastinal contours are stable status post median sternotomy and CABG. Patient is rotated to the left. There is stable chronic lung disease with hyperinflation and scattered scarring. A small subpleural nodular density in the right upper lobe appears stable. There is no significant pleural effusion.  IMPRESSION: Stable chronic lung disease post CABG. No acute cardiopulmonary process demonstrated.   Electronically Signed   By: Roxy Horseman M.D.   On: 11/14/2013 23:32    ECG  Atrial fibrillation with rapid ventricular response Abnormal ECG Since last tracing rate faster and atrial fib persists Confirmed by TAYLOR  2DEcho on 07/19/12: - Left ventricle: The cavity size was normal. Wall thickness was normal. Systolic function was normal. The estimated ejection fraction was in the range of 55% to 60%. Wall motion was normal; there were no regional wall motion abnormalities. - Left atrium: The atrium was mildly dilated. - Right atrium: The atrium was mildly dilated. - Pulmonary arteries: Systolic pressure was mildly increased. PA peak pressure: 42mm Hg (S).  ASSESSMENT AND PLAN  1. chronic dyspnea, multifactorial.  Significant pulmonary component 2. permanent atrial fibrillation.  Not a candidate for anticoagulation because of GI bleeding.  Her heart rate is reasonably well controlled at the present time on a combination of diltiazem and  low-dose beta blocker. 3. chronic diastolic heart failure.  She appears to be at dry weight.  Would consider converting back to home dose of furosemide 40 mg twice a day tomorrow. 4. COPD 5. Deconditioning  Recommendation: Would continue current cardiac meds.  Switch back to oral furosemide tomorrow.  Agree with plans for probable transfer to [facility] facility for further rehabilitation possibly  Monday.    Signed, Cassell Clement, MD  11/18/2013, 4:20 PM

## 2013-11-19 ENCOUNTER — Other Ambulatory Visit: Payer: Self-pay | Admitting: Cardiology

## 2013-11-19 LAB — CBC
HCT: 33.3 % — ABNORMAL LOW (ref 36.0–46.0)
HEMOGLOBIN: 10.2 g/dL — AB (ref 12.0–15.0)
MCH: 27.6 pg (ref 26.0–34.0)
MCHC: 30.6 g/dL (ref 30.0–36.0)
MCV: 90.2 fL (ref 78.0–100.0)
Platelets: 190 10*3/uL (ref 150–400)
RBC: 3.69 MIL/uL — ABNORMAL LOW (ref 3.87–5.11)
RDW: 13.9 % (ref 11.5–15.5)
WBC: 8.5 10*3/uL (ref 4.0–10.5)

## 2013-11-19 LAB — BASIC METABOLIC PANEL
Anion gap: 9 (ref 5–15)
BUN: 24 mg/dL — AB (ref 6–23)
CHLORIDE: 98 meq/L (ref 96–112)
CO2: 32 mEq/L (ref 19–32)
Calcium: 10 mg/dL (ref 8.4–10.5)
Creatinine, Ser: 1.02 mg/dL (ref 0.50–1.10)
GFR calc non Af Amer: 50 mL/min — ABNORMAL LOW (ref >90)
GFR, EST AFRICAN AMERICAN: 58 mL/min — AB (ref >90)
GLUCOSE: 119 mg/dL — AB (ref 70–99)
POTASSIUM: 4.1 meq/L (ref 3.7–5.3)
Sodium: 139 mEq/L (ref 137–147)

## 2013-11-19 MED ORDER — FUROSEMIDE 40 MG PO TABS
40.0000 mg | ORAL_TABLET | Freq: Two times a day (BID) | ORAL | Status: DC
  Administered 2013-11-19 – 2013-11-20 (×3): 40 mg via ORAL
  Filled 2013-11-19 (×5): qty 1

## 2013-11-19 MED ORDER — DILTIAZEM HCL ER COATED BEADS 300 MG PO CP24: 300.0000 mg | ORAL_CAPSULE | Freq: Every day | ORAL | Status: DC

## 2013-11-19 MED ORDER — METOPROLOL SUCCINATE ER 25 MG PO TB24: 25.0000 mg | ORAL_TABLET | Freq: Every day | ORAL | Status: DC

## 2013-11-19 NOTE — Progress Notes (Signed)
Note: This document was prepared with digital dictation and possible smart phrase technology. Any transcriptional errors that result from this process are unintentional.   [name] [id number] DOB: 04/13/1930 DOA: 11/14/2013 PCP: [name],[name], MD  Brief narrative:  80 y/o ?, known h/o chronic tobacco use, severe COPD O2 dependent, bilateral SFA stenosis, CABG X. 5005, BMS 2014, GI bleed from AVM, thoracic aneurysm, chronic hyponatremia, carotid disease chronic diastolic failure on echo prior to 07/19/12 admitted [birth year] and with acute shortness of breath, found to be in rapid A. fib  Her meds were recently changed at Cardiology office and her cardizem was increased to 360 daily  She has had increasing episodes of fluttering in her chest of late past 1-2 months  She has also had inc Le swelling prompting increasing use of prn lasix, which she stopped on 11/10/13  She has been eating salty foods. Her Afib was thought to be driven by deompensated chf and cardiology was consulted to help with management   Past medical history-As per Problem list Chart reviewed as below- Reviewed   Consultants:  None  Procedures:  None  Antibiotics:  None   Subjective   SOB improved still has some dyspnea on exertion Tolerating diet Seems less short of breath at bedside No fever no chills no other issues   Objective    Interim History:   Telemetry: A fibrillation rate controlled   Objective: Filed Vitals:   11/18/13 2000 11/18/13 2047 11/19/13 0527 11/19/13 0825  BP: 141/69  134/69   Pulse: 79  80   Temp: 97.6 F (36.4 C)  98 F (36.7 C)   TempSrc: Oral  Oral   Resp: 20  18   Height:      Weight:   86.2 kg (190 lb 0.6 oz)   SpO2: 96% 96% 98% 98%    Intake/Output Summary (Last 24 hours) at 11/19/13 1308 Last data filed at 11/18/13 1700  Gross per 24 hour  Intake    240 ml  Output      0 ml  Net    240 ml    Exam:  General: Alert pleasant but tired  today Cardiovascular: S1-S2 irregularly irregular, rate seems reasonable Respiratory: Clinically clear Skin no lower extremity edema stasis dermatitis with some excoriation   Data Reviewed: Basic Metabolic Panel:  Recent Labs Lab 11/15/13 0715 11/16/13 0215 11/17/13 0410 11/18/13 0354 11/19/13 0517  NA 143 139 138 139 139  K 4.6 4.5 3.9 4.2 4.1  CL 96 97 94* 96 98  CO2 30 33* 33* 33* 32  GLUCOSE 190* 138* 110* 111* 119*  BUN 19 31* 29* 27* 24*  CREATININE 1.11* 1.24* 1.22* 1.23* 1.02  CALCIUM 10.4 9.7 9.5 9.6 10.0   Liver Function Tests:  Recent Labs Lab 11/15/13 0715 11/16/13 0215  AST 16 13  ALT 12 12  ALKPHOS 85 67  BILITOT 0.3 <0.2*  PROT 7.5 6.0  ALBUMIN 3.8 3.2*   No results found for this basename: LIPASE, AMYLASE,  in the last 168 hours No results found for this basename: AMMONIA,  in the last 168 hours CBC:  Recent Labs Lab 11/14/13 2307 11/15/13 0715 11/16/13 0215 11/19/13 0517  WBC 7.6 10.5 11.8* 8.5  NEUTROABS  --  10.1*  --   --   HGB 11.6* 11.9* 10.0* 10.2*  HCT 37.7 39.1 31.8* 33.3*  MCV 92.4 91.8 90.3 90.2  PLT 177 207 189 190   Cardiac Enzymes:  Recent Labs Lab 11/15/13  0715 11/15/13 1153 11/15/13 1855  TROPONINI <0.30 <0.30 <0.30   BNP: No components found with this basename: POCBNP,  CBG: No results found for this basename: GLUCAP,  in the last 168 hours  No results found for this or any previous visit (from the past 240 hour(s)).   Studies:              All Imaging reviewed and is as per above notation   Scheduled Meds: . aspirin EC  81 mg Oral Daily  . atorvastatin  20 mg Oral q1800  . budesonide (PULMICORT) nebulizer solution  0.25 mg Nebulization BID  . diltiazem  300 mg Oral Daily  . enoxaparin (LOVENOX) injection  40 mg Subcutaneous Q24H  . furosemide  40 mg Oral BID  . ipratropium-albuterol  3 mL Nebulization BID  . metoprolol succinate  25 mg Oral Daily  . pantoprazole  40 mg Oral Daily  . potassium  chloride SA  20 mEq Oral BID  . predniSONE  40 mg Oral QAC breakfast  . sodium chloride  3 mL Intravenous Q12H  . sodium chloride  3 mL Intravenous Q12H   Continuous Infusions:    Assessment/Plan:  Acute decompensated diastolic hf-baseline weight from office records 80 kg-current weight 85 KG. weights are inaccurate Was on by mouth alternating with IV diuresis-changed to IV lasix 7.24-cardiology changed dosing to Lasix 40 twice a day and recommended this dose on discharge -3 L today Afib c RVR-worsened by #1-decrease Cardizem dose 360-->240,  metoprolol 12.5 3 times a day to 25 mg XL daily and reassess in a.m.    No further orthostasis Patient ambulated her today  Cardiology has been consulted to help with input for both Afib and CHF on 11/18/13  Iatrogenic hypotension secondary to medications-see above-resolved. Orthostatics are negative today  End stage copd-on 2 l oxygen at home- at baseline-no acute component. D/c'd steroids initally-might have COPD component-short burst of prednisone 40 daily started 7/24 to discontinue 7/28 Continue levalbuterol/atrovent/budesonide.   Mild glucose intolerance-secondary to steroids. Outpatient A1c S/p CABG-continue asa 81 mg daily. Continue bb if tolerated  H/o hyponatremia  ckd stage 3-kidney function holding stable.  Continue current medication Hyperlipidemia-continue atorvastatin 20 daily   Code Status:  full  Family Communication:  none present Disposition Plan: likely d/c to [facility] 48-72 hrs   Pleas Koch, MD  [facility] Pager [phone] 11/19/2013, 1:08 PM    LOS: 5 days

## 2013-11-19 NOTE — Progress Notes (Signed)
Patient walked about 20-30 feet before getting short of breath. Oxygen sats were 94% on 2 liters O2 while walking.

## 2013-11-19 NOTE — Progress Notes (Signed)
Primary cardiologist: Dr. Willa RoughJeffrey Katz  Subjective:   In bedside chair, no palpitations or chest pain.   Objective:   Temp:  [97.6 F (36.4 C)-99 F (37.2 C)] 98 F (36.7 C) (07/26 0527) Pulse Rate:  [79-105] 80 (07/26 0527) Resp:  [18-20] 18 (07/26 0527) BP: (134-141)/(64-69) 134/69 mmHg (07/26 0527) SpO2:  [96 %-98 %] 98 % (07/26 0825) Weight:  [190 lb 0.6 oz (86.2 kg)] 190 lb 0.6 oz (86.2 kg) (07/26 0527) Last BM Date: 11/18/13  Filed Weights   11/17/13 0720 11/18/13 0500 11/19/13 0527  Weight: 190 lb 11.2 oz (86.5 kg) 188 lb 4.4 oz (85.4 kg) 190 lb 0.6 oz (86.2 kg)    Intake/Output Summary (Last 24 hours) at 11/19/13 0921 Last data filed at 11/18/13 1700  Gross per 24 hour  Intake    480 ml  Output    300 ml  Net    180 ml    Telemetry: Atrial fibrillation.  Exam:  General: No distress.  Lungs: Decreased breath sounds, nonlabored.  Cardiac: Irregularly irregular.  Extremities: No pitting.   Lab Results:  Basic Metabolic Panel:  Recent Labs Lab 11/17/13 0410 11/18/13 0354 11/19/13 0517  NA 138 139 139  K 3.9 4.2 4.1  CL 94* 96 98  CO2 33* 33* 32  GLUCOSE 110* 111* 119*  BUN 29* 27* 24*  CREATININE 1.22* 1.23* 1.02  CALCIUM 9.5 9.6 10.0    Liver Function Tests:  Recent Labs Lab 11/15/13 0715 11/16/13 0215  AST 16 13  ALT 12 12  ALKPHOS 85 67  BILITOT 0.3 <0.2*  PROT 7.5 6.0  ALBUMIN 3.8 3.2*    CBC:  Recent Labs Lab 11/15/13 0715 11/16/13 0215 11/19/13 0517  WBC 10.5 11.8* 8.5  HGB 11.9* 10.0* 10.2*  HCT 39.1 31.8* 33.3*  MCV 91.8 90.3 90.2  PLT 207 189 190    Cardiac Enzymes:  Recent Labs Lab 11/15/13 0715 11/15/13 1153 11/15/13 1855  TROPONINI <0.30 <0.30 <0.30    BNP:  Recent Labs  04/28/13 1857 11/07/13 1459 11/14/13 2307  PROBNP 458.0* 181.0* 1360.0*     Medications:   Scheduled Medications: . aspirin EC  81 mg Oral Daily  . atorvastatin  20 mg Oral q1800  . budesonide (PULMICORT)  nebulizer solution  0.25 mg Nebulization BID  . diltiazem  300 mg Oral Daily  . enoxaparin (LOVENOX) injection  40 mg Subcutaneous Q24H  . furosemide  40 mg Oral BID  . ipratropium-albuterol  3 mL Nebulization BID  . metoprolol succinate  25 mg Oral Daily  . pantoprazole  40 mg Oral Daily  . potassium chloride SA  20 mEq Oral BID  . predniSONE  40 mg Oral QAC breakfast  . sodium chloride  3 mL Intravenous Q12H  . sodium chloride  3 mL Intravenous Q12H      PRN Medications:  acetaminophen, acetaminophen, ipratropium-albuterol, nitroGLYCERIN, ondansetron (ZOFRAN) IV, ondansetron, polyvinyl alcohol   Assessment:   1. Chronic shortness of breath, multifactorial in the setting of COPD, diastolic heart failure, and atrial fibrillation.  2. Permanent atrial fibrillation, poor candidate for anticoagulation due to recurrent GI bleeding with AVMs. Strategy of heart rate control adopted, tolerating aspirin.  3. Chronic diastolic heart failure, transitioned to oral Lasix today.  4. COPD.  5. Deconditioning.   Plan/Discussion:    Continue Cardizem CD and Toprol-XL for heart rate control. She is on Lasix 40 mg twice daily, would continue as outpatient regimen with potassium supplements.  Satira Sark, M.D., F.A.C.C.

## 2013-11-20 ENCOUNTER — Other Ambulatory Visit: Payer: Self-pay | Admitting: Cardiology

## 2013-11-20 NOTE — Progress Notes (Signed)
Physical Therapy Treatment Patient Details Name: [name] MRN: [id number] DOB: 1930/03/28 Today's Date: 11/20/2013    History of Present Illness Pt admit with CP and SOB.      PT Comments    Pt admitted with above. Pt currently with functional limitations due to balance and endurance deficits.  Pt will benefit from skilled PT to increase their independence and safety with mobility to allow discharge to the venue listed below.   Follow Up Recommendations  Home health PT;Supervision/Assistance - 24 hour or SNF if no 24 hour care.      Equipment Recommendations  None recommended by PT    Recommendations for Other Services       Precautions / Restrictions Precautions Precautions: Fall Restrictions Weight Bearing Restrictions: No    Mobility  Bed Mobility Overal bed mobility: Independent                Transfers Overall transfer level: Independent                  Ambulation/Gait Ambulation/Gait assistance: Min guard Ambulation Distance (Feet): 75 Feet Assistive device: Rolling walker (2 wheeled) Gait Pattern/deviations: Step-through pattern;Decreased stride length   Gait velocity interpretation: Below normal speed for age/gender General Gait Details: Pt ambulates well with RW.  Steady gait.  Pt stated her legs felt weak but was able to ambulate with RW use.  Ambulated pt on 3LO2 with sats >90%.    Stairs            Wheelchair Mobility    Modified Rankin (Stroke Patients Only)       Balance Overall balance assessment: Needs assistance;History of Falls Sitting-balance support: No upper extremity supported;Feet supported Sitting balance-Leahy Scale: Good     Standing balance support: During functional activity;No upper extremity supported Standing balance-Leahy Scale: Fair Standing balance comment: can stand statically without UE support for up to a minute.                     Cognition Arousal/Alertness:  Awake/alert Behavior During Therapy: WFL for tasks assessed/performed Overall Cognitive Status: Within Functional Limits for tasks assessed                      Exercises      General Comments        Pertinent Vitals/Pain Sats >90% on 3LO2 with activity and 2LO2 at rest.  No pain.     Home Living Family/patient expects to be discharged to:: Inpatient rehab Living Arrangements: Alone                  Prior Function            PT Goals (current goals can now be found in the care plan section) Progress towards PT goals: Progressing toward goals    Frequency  Min 3X/week    PT Plan Current plan remains appropriate    Co-evaluation             End of Session Equipment Utilized During Treatment: Gait belt;Oxygen Activity Tolerance: Patient limited by fatigue Patient left: in chair;with call bell/phone within reach;with nursing/sitter in room     Time: 1200-1215 PT Time Calculation (min): 15 min  Charges:  $Gait Training: 8-22 mins                    G Codes:      INGOLD,Gunnard Dorrance 11/20/2013, 2:10 PM [facility] In[facility],PT Acute Rehabilitation [phone])

## 2013-11-20 NOTE — Discharge Summary (Signed)
Physician Discharge Summary  [name] ZOX:[id number] DOB: [birth year] DOA: 11/14/2013  PCP: Kaleen Mask, MD  Admit date: 11/14/2013 Discharge date: 11/20/2013  Time spent: 35 minutes  Recommendations for Outpatient Follow-up:  1. Needs outpatient followup with both pulmonary Dr. Vassie Loll, cardiology Dr. Myrtis Ser  2. Moderate risk for readmission given severity of both COPD and CHF-ambulatory referral to advanced heart failure clinic 3. Needs complete metabolic panel, CBC one week 4. Recommend daily weights at skilled facility as well as fluid restriction 1600 cc, heart healthy low-salt 2 g diet 5. She has some component of chronic shortness of breath which is functional and secondary to debility-in addition heart rates of up to 120 should reasonably tolerated as we cannot titrate her medications her secondary to hypotension during this hospital stay 6. Would consider palliative involvement as an outpatient if any failure to thrive  Discharge Diagnoses:  Principal Problem:   Acute respiratory failure Active Problems:   COPD (chronic obstructive pulmonary disease)   Chronic diastolic CHF (congestive heart failure)   Atrial fibrillation with tachycardic ventricular rate   Discharge Condition: fair  Diet recommendation: heart healthy low salt fluid restriction  Filed Weights   11/18/13 0500 11/19/13 0527 11/20/13 0609  Weight: 85.4 kg (188 lb 4.4 oz) 86.2 kg (190 lb 0.6 oz) 85 kg (187 lb 6.3 oz)    History of present illness:   80 y/o ?, known h/o chronic tobacco use, severe COPD O2 dependent, bilateral SFA stenosis, CABG X. 5005, BMS 2014, GI bleed from AVM, thoracic aneurysm, chronic hyponatremia, carotid disease chronic diastolic failure on echo prior to 07/19/12 admitted 78/22/80 and with acute shortness of breath, found to be in rapid A. fib  Her meds were recently changed at Cardiology office and her cardizem was increased to 360 daily  She has had increasing episodes of  fluttering in her chest of late past 1-2 months  She has also had inc Le swelling prompting increasing use of prn lasix, which she stopped on 11/10/13  She has been eating salty foods.  Her Afib was thought to be driven by deompensated chf and cardiology was consulted to help with management     Acute decompensated diastolic hf-baseline weight from office records 80 kg-current weight 85. weights are inaccurate here in the hospital however Was on by mouth alternating with IV diuresis-changed to IV lasix 7.24-cardiology changed dosing to Lasix 40 twice a day and recommended this dose on discharge  Total output was -2.463 Her regimen was optimized with cardiology input and it was felt that she had a strong component of functional debility from her advanced heart and respiratory failure. She is being arranged relatively close followup with her cardiologist and will be discharged to collapse nursing facility today Please see above recommendations as well  Afib c RVR-worsened by #1-decrease Cardizem dose 360-->240, metoprolol 12.5 3 times a day to 25 mg XL daily and reassess in a.m.  Her heart rate does go up on ambulation to the 120 range which is reasonable given her multiple comorbidities, risk of hypotension No further orthostasis  Patient ambulated her prior to discharge as well as day of discharge and she tolerated 20-30 feet ambulation which is about her baseline  Iatrogenic hypotension secondary to medications-see above-resolved. Orthostatics are negative today   End stage copd-on 2 l oxygen at home- at baseline-no acute component. D/c'd steroids initally-might have COPD component-short burst of prednisone 40 daily started 7/24 to discontinue 7/28 Continue levalbuterol/atrovent/budesonide.   Mild glucose intolerance-secondary to  steroids. Outpatient A1c  S/p CABG-continue asa 81 mg daily. Continue bb if tolerated  H/o hyponatremia  ckd stage 3-kidney function holding stable. Continue  current medication  Hyperlipidemia-continue atorvastatin 20 daily   Procedures: None  Consultations:  Cardiology  Discharge Exam: Filed Vitals:   11/20/13 0609  BP: 134/73  Pulse: 80  Temp: 97.4 F (36.3 C)  Resp: 18    Her pleasant oriented no apparent distress at present Doesn't feel too good this morning but sat up in the chair the whole day yesterday and ambulated 30 feet without desaturation Eating and drinking fairly well No chest pain   General: EOMI NCAT Cardiovascular: S1-S2 no murmur or gallop Respiratory:  Clinically clear no added sound Trace lower extremity edema much improved from prior  Discharge Instructions You were cared for by a hospitalist during your hospital stay. If you have any questions about your discharge medications or the care you received while you were in the hospital after you are discharged, you can call the unit and asked to speak with the hospitalist on call if the hospitalist that took care of you is not available. Once you are discharged, your primary care physician will handle any further medical issues. Please note that NO REFILLS for any discharge medications will be authorized once you are discharged, as it is imperative that you return to your primary care physician (or establish a relationship with a primary care physician if you do not have one) for your aftercare needs so that they can reassess your need for medications and monitor your lab values.  Discharge Instructions   Diet - low sodium heart healthy    Complete by:  As directed      Discharge instructions    Complete by:  As directed      Increase activity slowly    Complete by:  As directed             Medication List    STOP taking these medications       bisoprolol 10 MG tablet  Commonly known as:  ZEBETA      TAKE these medications       acetaminophen 500 MG tablet  Commonly known as:  TYLENOL  Take 1,000 mg by mouth every 6 (six) hours as needed for  moderate pain.     albuterol 108 (90 BASE) MCG/ACT inhaler  Commonly known as:  PROVENTIL HFA;VENTOLIN HFA  Inhale 2 puffs into the lungs every 6 (six) hours as needed for wheezing or shortness of breath.     albuterol (2.5 MG/3ML) 0.083% nebulizer solution  Commonly known as:  PROVENTIL  Take 2.5 mg by nebulization every 6 (six) hours as needed for wheezing.     aspirin EC 81 MG tablet  Take 81 mg by mouth daily.     atorvastatin 40 MG tablet  Commonly known as:  LIPITOR  Take 0.5 tablets (20 mg total) by mouth daily at 6 PM.     budesonide-formoterol 160-4.5 MCG/ACT inhaler  Commonly known as:  SYMBICORT  Inhale 2 puffs into the lungs 2 (two) times daily.     diltiazem 300 MG 24 hr capsule  Commonly known as:  CARDIZEM CD  Take 1 capsule (300 mg total) by mouth daily.     furosemide 40 MG tablet  Commonly known as:  LASIX  Take 1 tablet (40 mg total) by mouth 2 (two) times daily.     IPRATROPIUM BROMIDE NA  Place 2 sprays into the nose  daily as needed (for stuffiness/wheezing).     metoprolol succinate 25 MG 24 hr tablet  Commonly known as:  TOPROL-XL  Take 1 tablet (25 mg total) by mouth daily.     nitroGLYCERIN 0.4 MG SL tablet  Commonly known as:  NITROSTAT  Place 0.4 mg under the tongue every 5 (five) minutes as needed for chest pain.     pantoprazole 40 MG tablet  Commonly known as:  PROTONIX  Take 40 mg by mouth daily.     potassium chloride SA 20 MEQ tablet  Commonly known as:  K-DUR,KLOR-CON  Take 20 mEq by mouth 2 (two) times daily.     predniSONE 20 MG tablet  Commonly known as:  DELTASONE  3 tabs po day one, then 2 po daily x 4 days     PRESERVISION AREDS 2 PO  Take 1 tablet by mouth 2 (two) times daily.     SYSTANE 0.4-0.3 % Soln  Generic drug:  Polyethyl Glycol-Propyl Glycol  Apply 2 drops to eye daily as needed (for dry eyes).     tiotropium 18 MCG inhalation capsule  Commonly known as:  SPIRIVA  Place 18 mcg into inhaler and inhale daily.       Vitamin D (Ergocalciferol) 50000 UNITS Caps capsule  Commonly known as:  DRISDOL  Take 50,000 Units by mouth every 14 (fourteen) days. Every other Sunday       Allergies  Allergen Reactions  . Other Other (See Comments)    TETANUS-can only take 1/2 dose at one time, can take the other 1/2 dose about 3 days later  . Codeine Nausea And Vomiting  . Sulfonamide Derivatives Nausea And Vomiting  . Warfarin     H/o AVM's prechief complaintcluding anticoagulation  . Penicillins Itching, Rash and Other (See Comments)    Nasal itching   . Ranitidine Hcl Itching and Rash       Follow-up Information   Follow up with Kaleen Mask, MD. Schedule an appointment as soon as possible for a visit in 2 days.   Specialty:  Family Medicine   Contact information:   [address] [place] [address] [phone]       Follow up with your cardiologist. Schedule an appointment as soon as possible for a visit in 2 days.       The results of significant diagnostics from this hospitalization (including imaging, microbiology, ancillary and laboratory) are listed below for reference.    Significant Diagnostic Studies: Dg Chest Port 1 View  11/14/2013   CLINICAL DATA:  Shortness of breath with cough and wheezing. History of COPD.  EXAM: PORTABLE CHEST - 1 VIEW  COMPARISON:  Radiographs 04/28/2013 and 02/08/2013.  FINDINGS: 2320 hr. The heart size and mediastinal contours are stable status post median sternotomy and CABG. Patient is rotated to the left. There is stable chronic lung disease with hyperinflation and scattered scarring. A small subpleural nodular density in the right upper lobe appears stable. There is no significant pleural effusion.  IMPRESSION: Stable chronic lung disease post CABG. No acute cardiopulmonary process demonstrated.   Electronically Signed   By: Roxy Horseman M.D.   On: 11/14/2013 23:32    Microbiology: No results found for this or any previous visit (from  the past 240 hour(s)).   Labs: Basic Metabolic Panel:  Recent Labs Lab 11/15/13 0715 11/16/13 0215 11/17/13 0410 11/18/13 0354 11/19/13 0517  NA 143 139 138 139 139  K 4.6 4.5 3.9 4.2 4.1  CL 96  97 94* 96 98  CO2 30 33* 33* 33* 32  GLUCOSE 190* 138* 110* 111* 119*  BUN 19 31* 29* 27* 24*  CREATININE 1.11* 1.24* 1.22* 1.23* 1.02  CALCIUM 10.4 9.7 9.5 9.6 10.0   Liver Function Tests:  Recent Labs Lab 11/15/13 0715 11/16/13 0215  AST 16 13  ALT 12 12  ALKPHOS 85 67  BILITOT 0.3 <0.2*  PROT 7.5 6.0  ALBUMIN 3.8 3.2*   No results found for this basename: LIPASE, AMYLASE,  in the last 168 hours No results found for this basename: AMMONIA,  in the last 168 hours CBC:  Recent Labs Lab 11/14/13 2307 11/15/13 0715 11/16/13 0215 11/19/13 0517  WBC 7.6 10.5 11.8* 8.5  NEUTROABS  --  10.1*  --   --   HGB 11.6* 11.9* 10.0* 10.2*  HCT 37.7 39.1 31.8* 33.3*  MCV 92.4 91.8 90.3 90.2  PLT 177 207 189 190   Cardiac Enzymes:  Recent Labs Lab 11/15/13 0715 11/15/13 1153 11/15/13 1855  TROPONINI <0.30 <0.30 <0.30   BNP: BNP (last 3 results)  Recent Labs  04/28/13 1857 11/07/13 1459 11/14/13 2307  PROBNP 458.0* 181.0* 1360.0*   CBG: No results found for this basename: GLUCAP,  in the last 168 hours     Signed:  Rhetta MuraSAMTANI, JAI-GURMUKH  [facility] 11/20/2013, 8:39 AM

## 2013-11-20 NOTE — Progress Notes (Signed)
Discharge education completed by RN. Ptreceived a copy of discharge paperwork. Denies any questions at this time. IV removed, site is within normal limits. Pt will discharge from the unit via stretcher with [facility].

## 2013-11-20 NOTE — Clinical Social Work Placement (Signed)
Clinical Social Work Department CLINICAL SOCIAL WORK PLACEMENT NOTE 11/20/2013  Patient:  [name],[name]  Account Number:  [id number] Admit date:  11/14/2013  Clinical Social Worker:  Carren RangLINDSAY PURITZ, LCSWA  Date/time:  11/16/2013 03:39 PM  Clinical Social Work is seeking post-discharge placement for this patient at the following level of care:   SKILLED NURSING   (*CSW will update this form in Epic as items are completed)   11/16/2013  Patient/family provided with [facility] of Clinical Social Work's list of facilities offering this level of care within the geographic area requested by the patient (or if unable, by the patient's family).  11/16/2013  Patient/family informed of their freedom to choose among providers that offer the needed level of care, that participate in Medicare, Medicaid or managed care program needed by the patient, have an available bed and are willing to accept the patient.  11/16/2013  Patient/family informed of [facility]' ownership interest in [facility], as well as of the fact that they are under no obligation to receive care at this facility.  PASARR submitted to EDS on  PASARR number received on   FL2 transmitted to all facilities in geographic area requested by pt/family on  11/16/2013 FL2 transmitted to all facilities within larger geographic area on   Patient informed that his/her managed care company has contracts with or will negotiate with  certain facilities, including the following:     Patient/family informed of bed offers received:  11/17/2013 [facility] [place] Physician recommends and patient chooses bed at    [facility] [place] on  11/20/2013 Patient to be transferred to facility by Ambulance Patient and family notified of transfer on 11/20/2013 Name of family member notified:  [name]  The following physician request were  entered in Epic:   Additional Comments: Per MD patient ready for Dc to [facility]. RN, patient, patient's daughter, and facility made aware of DC. RN given number for report. DC packet on chart. Ambulance transport requested for patient. CSW signing off.   Roddie McBryant Leotha Voeltz MSW, PeckLCSWA, PhillipsLCASA, [phone]

## 2013-11-24 ENCOUNTER — Other Ambulatory Visit: Payer: Self-pay

## 2013-11-24 MED ORDER — METOPROLOL SUCCINATE ER 25 MG PO TB24: 25.0000 mg | ORAL_TABLET | Freq: Every day | ORAL | Status: DC

## 2013-12-22 ENCOUNTER — Encounter: Payer: Self-pay | Admitting: Cardiology

## 2013-12-22 ENCOUNTER — Ambulatory Visit (INDEPENDENT_AMBULATORY_CARE_PROVIDER_SITE_OTHER): Payer: Medicare Other | Admitting: Cardiology

## 2013-12-22 VITALS — BP 108/44 | HR 73 | Ht 70.0 in | Wt 187.0 lb

## 2013-12-22 DIAGNOSIS — I25810 Atherosclerosis of coronary artery bypass graft(s) without angina pectoris: Secondary | ICD-10-CM

## 2013-12-22 DIAGNOSIS — I4891 Unspecified atrial fibrillation: Secondary | ICD-10-CM

## 2013-12-22 DIAGNOSIS — I482 Chronic atrial fibrillation, unspecified: Secondary | ICD-10-CM

## 2013-12-22 DIAGNOSIS — I509 Heart failure, unspecified: Secondary | ICD-10-CM

## 2013-12-22 DIAGNOSIS — I5032 Chronic diastolic (congestive) heart failure: Secondary | ICD-10-CM

## 2013-12-22 NOTE — Assessment & Plan Note (Addendum)
Atrial fibrillation rate is controlled today. No change in therapy.  She's not a candidate for anticoagulation with her history of severe GI bleeding and AVMs.

## 2013-12-22 NOTE — Assessment & Plan Note (Signed)
Coronary disease is stable. She did not appear to have an acute urinary event with her recent hospitalization.

## 2013-12-22 NOTE — Patient Instructions (Signed)
**Note De-identified  Obfuscation** Your physician recommends that you continue on your current medications as directed. Please refer to the Current Medication list given to you today.  Your physician recommends that you schedule a follow-up appointment in: 3 months  

## 2013-12-22 NOTE — Progress Notes (Signed)
Patient ID: [name], female   DOB: 1929-06-22, 80 y.o.   MRN: [id number]    HPI  Patient is seen today to followup severe lung disease and coronary disease with volume overload. She was recently hospitalized again in July, 2015. She was seen by our team and stabilize. Her volume status and pulmonary status were stabilize. She is now here for followup.  Allergies  Allergen Reactions  . Other Other (See Comments)    TETANUS-can only take 1/2 dose at one time, can take the other 1/2 dose about 3 days later  . Codeine Nausea And Vomiting  . Sulfonamide Derivatives Nausea And Vomiting  . Warfarin     H/o AVM's prechief complaintcluding anticoagulation  . Penicillins Itching, Rash and Other (See Comments)    Nasal itching   . Ranitidine Hcl Itching and Rash    Current Outpatient Prescriptions  Medication Sig Dispense Refill  . acetaminophen (TYLENOL) 500 MG tablet Take 1,000 mg by mouth every 6 (six) hours as needed for moderate pain.      Marland Kitchen albuterol (PROVENTIL HFA;VENTOLIN HFA) 108 (90 BASE) MCG/ACT inhaler Inhale 2 puffs into the lungs every 6 (six) hours as needed for wheezing or shortness of breath.      Marland Kitchen albuterol (PROVENTIL) (2.5 MG/3ML) 0.083% nebulizer solution Take 2.5 mg by nebulization every 6 (six) hours as needed for wheezing.      Marland Kitchen aspirin EC 81 MG tablet Take 81 mg by mouth daily.      Marland Kitchen atorvastatin (LIPITOR) 40 MG tablet Take 0.5 tablets (20 mg total) by mouth daily at 6 PM.  90 tablet  1  . budesonide-formoterol (SYMBICORT) 160-4.5 MCG/ACT inhaler Inhale 2 puffs into the lungs 2 (two) times daily.      Marland Kitchen diltiazem (CARDIZEM CD) 300 MG 24 hr capsule Take 1 capsule (300 mg total) by mouth daily.  30 capsule  0  . furosemide (LASIX) 40 MG tablet Take 1 tablet (40 mg total) by mouth 2 (two) times daily.  180 tablet  1  . IPRATROPIUM BROMIDE NA Place 2 sprays into the nose daily as needed (for stuffiness/wheezing).       Marland Kitchen losartan (COZAAR) 50 MG tablet TAKE 1 TABLET  DAILY  90 tablet  3  . metoprolol succinate (TOPROL-XL) 25 MG 24 hr tablet Take 1 tablet (25 mg total) by mouth daily.  90 tablet  0  . Multiple Vitamins-Minerals (PRESERVISION AREDS 2 PO) Take 1 tablet by mouth 2 (two) times daily.      . nitroGLYCERIN (NITROSTAT) 0.4 MG SL tablet Place 0.4 mg under the tongue every 5 (five) minutes as needed for chest pain.      . pantoprazole (PROTONIX) 40 MG tablet Take 40 mg by mouth daily.      Bertram Gala Glycol-Propyl Glycol (SYSTANE) 0.4-0.3 % SOLN Apply 2 drops to eye daily as needed (for dry eyes).      . potassium chloride SA (K-DUR,KLOR-CON) 20 MEQ tablet Take 20 mEq by mouth 2 (two) times daily.      . predniSONE (DELTASONE) 20 MG tablet 3 tabs po day one, then 2 po daily x 4 days  11 tablet  0  . tiotropium (SPIRIVA) 18 MCG inhalation capsule Place 18 mcg into inhaler and inhale daily.       . Vitamin D, Ergocalciferol, (DRISDOL) 50000 UNITS CAPS Take 50,000 Units by mouth every 14 (fourteen) days. Every other Sunday       No current facility-administered medications for  this visit.    History   Social History  . Marital Status: Widowed    Spouse Name: N/A    Number of Children: 1  . Years of Education: N/A   Occupational History  . retired     [employer]   Social History Main Topics  . Smoking status: Former Smoker -- 0.50 packs/day for 59 years    Types: Cigarettes    Quit date: 02/01/2013  . Smokeless tobacco: Not on file  . Alcohol Use: No  . Drug Use: Not on file  . Sexual Activity: Not on file   Other Topics Concern  . Not on file   Social History Narrative  . No narrative on file    Family History  Problem Relation Age of Onset  . Stroke Sister   . Heart failure Mother   . Cancer Sister     Breast and Lung   . Cancer Brother     breast cancer    Past Medical History  Diagnosis Date  . CAD (coronary artery disease)     a. s/p CABG x5 (2005) b. s/p BMS-prox SVG-D2 (2014)  . Hypertension   . Hyperlipidemia   . GI  bleed     AVMs  . Aneurysm, aortic     thoracic aorta, stable at 4.1 cm, chest CT, May, 2012  . Syncope     Nitroglycerin plus a diuretic, April, 2009  . Hyponatremia     Chronic. Felt secondary to SIADH   . COPD (chronic obstructive pulmonary disease)   . Tobacco abuse   . Bradycardia   . Carotid artery disease     Hx of endarterectomy. Doppler October, 2011, stable, 0-39% RIC A., 40-59% LICA  . Tuberculosis     Exposures to tuberculosis 1970s, has tested negative by the health Department  . Atrial fibrillation     Paroxysmal with RVR 07/2011, not felt to be a coumadin candidate secondary to hx of GIB/AVM  . Venous stasis of lower extremity     Chronic  . Ejection fraction     a. EF 55-60%, echo, April, 2013 b. EF 55-60%, mild biatrial enlargement and PASP 42 mmH  . CHF with left ventricular diastolic dysfunction, NYHA class 2   . AAA (abdominal aortic aneurysm)   . On home O2     2L N/C  . Complication of anesthesia   . Shortness of breath   . Hx of CABG     CABG 2005  . Hematoma     Right groin hematoma, small, post cath, April, 2014  . Mitral regurgitation     Mild, hospital, March, 2014  . Leg ulcer     Ulcerated lesion on the anterior aspect of her left lower leg, June, 2014    Past Surgical History  Procedure Laterality Date  . Cholecystectomy    . Carotid endartercetomy    . Abdominal hysterectomy    . Coronary artery bypass graft  2005  . Coronary angioplasty with stent placement  07/18/12    severe native coronary artery disease, 99% proximal stenosis of the SVG-D2 status post successful PTCA/PCI with a Veriflex bare-metal stent, widely patent SVGs to both the right PDA sequential OM1 to OM 2, EF 65-70% and 3+ mitral regurgitation    Patient Active Problem List   Diagnosis Date Noted  . Dyspnea 11/15/2013  . Atrial fibrillation with tachycardic ventricular rate 11/15/2013  . Peripheral arterial disease 10/17/2013  . Acute respiratory failure 02/09/2013  .  Atypical chest pain 02/08/2013  . Cellulitis, leg 09/28/2012  . Leg ulcer   . CAD (coronary artery disease)   . Hematoma   . Mitral regurgitation   . Chronic diastolic CHF (congestive heart failure) 07/20/2012  . Anemia 07/20/2012  . Intermediate coronary syndrome 07/18/2012  . Acute exacerbation of chronic obstructive pulmonary disease (COPD) 02/15/2012  . Dehydration 02/12/2012  . Alkalosis, metabolic 02/12/2012  . Generalized weakness 02/11/2012  . Atrial fibrillation   . Chronic respiratory failure 07/29/2011  . H/O steroid therapy 07/28/2011  . Edema   . Aortic valve sclerosis   . Hypertension   . Hyperlipidemia   . GI bleed   . Aneurysm, aortic   . Syncope   . Hyponatremia   . COPD (chronic obstructive pulmonary disease)   . Tobacco abuse   . Bradycardia   . Hx of CABG   . Ejection fraction   . Carotid artery disease   . Tuberculosis   . OXYGEN-USE OF SUPPLEMENTAL 08/02/2009    ROS   Patient denies fever, chills, headache, sweats, rash, change in vision, change in hearing, chest pain, urinary symptoms, nausea vomiting. All other systems are reviewed and are negative.  PHYSICAL EXAM  The patient walks in with her rolling walker. She is wearing home O2. She gets short of breath. She hasn't rest and feels a little better. Head is atraumatic. Sclera and conjunctiva are normal. Skin reveals areas of ecchymoses. Lungs reveal marked decreased breath sounds. She is mildly short of breath at rest. Cardiac exam reveals distant heart sounds. The abdomen is soft. There is no significant peripheral edema.  Filed Vitals:   12/22/13 1523  BP: 108/44  Pulse: 73  Height:  (1.778 m)  Weight: 187 lb (84.823 kg)  SpO2: 95%     ASSESSMENT & PLAN

## 2013-12-22 NOTE — Assessment & Plan Note (Signed)
She is careful with her salt intake. She drinks too much free water. She and I discussed this at length.

## 2014-02-01 ENCOUNTER — Inpatient Hospital Stay (HOSPITAL_COMMUNITY)
Admission: EM | Admit: 2014-02-01 | Discharge: 2014-02-04 | DRG: 191 | Disposition: A | Discharge status: Home Health | Payer: Medicare Other | Attending: Oncology | Admitting: Oncology

## 2014-02-01 ENCOUNTER — Emergency Department (HOSPITAL_COMMUNITY): Payer: Medicare Other

## 2014-02-01 ENCOUNTER — Encounter (HOSPITAL_COMMUNITY): Payer: Self-pay | Admitting: Emergency Medicine

## 2014-02-01 DIAGNOSIS — J209 Acute bronchitis, unspecified: Secondary | ICD-10-CM | POA: Diagnosis present

## 2014-02-01 DIAGNOSIS — I10 Essential (primary) hypertension: Secondary | ICD-10-CM | POA: Diagnosis present

## 2014-02-01 DIAGNOSIS — I340 Nonrheumatic mitral (valve) insufficiency: Secondary | ICD-10-CM | POA: Diagnosis present

## 2014-02-01 DIAGNOSIS — I872 Venous insufficiency (chronic) (peripheral): Secondary | ICD-10-CM | POA: Diagnosis present

## 2014-02-01 DIAGNOSIS — Z9981 Dependence on supplemental oxygen: Secondary | ICD-10-CM

## 2014-02-01 DIAGNOSIS — Z79899 Other long term (current) drug therapy: Secondary | ICD-10-CM | POA: Diagnosis not present

## 2014-02-01 DIAGNOSIS — I480 Paroxysmal atrial fibrillation: Secondary | ICD-10-CM | POA: Diagnosis present

## 2014-02-01 DIAGNOSIS — Z951 Presence of aortocoronary bypass graft: Secondary | ICD-10-CM

## 2014-02-01 DIAGNOSIS — Z9861 Coronary angioplasty status: Secondary | ICD-10-CM

## 2014-02-01 DIAGNOSIS — T380X5A Adverse effect of glucocorticoids and synthetic analogues, initial encounter: Secondary | ICD-10-CM | POA: Diagnosis present

## 2014-02-01 DIAGNOSIS — Z7982 Long term (current) use of aspirin: Secondary | ICD-10-CM | POA: Diagnosis not present

## 2014-02-01 DIAGNOSIS — I714 Abdominal aortic aneurysm, without rupture: Secondary | ICD-10-CM | POA: Diagnosis present

## 2014-02-01 DIAGNOSIS — I2510 Atherosclerotic heart disease of native coronary artery without angina pectoris: Secondary | ICD-10-CM | POA: Diagnosis present

## 2014-02-01 DIAGNOSIS — E876 Hypokalemia: Secondary | ICD-10-CM | POA: Diagnosis present

## 2014-02-01 DIAGNOSIS — Z87891 Personal history of nicotine dependence: Secondary | ICD-10-CM

## 2014-02-01 DIAGNOSIS — I5032 Chronic diastolic (congestive) heart failure: Secondary | ICD-10-CM | POA: Diagnosis present

## 2014-02-01 DIAGNOSIS — R0602 Shortness of breath: Secondary | ICD-10-CM | POA: Diagnosis present

## 2014-02-01 DIAGNOSIS — I482 Chronic atrial fibrillation, unspecified: Secondary | ICD-10-CM

## 2014-02-01 DIAGNOSIS — J441 Chronic obstructive pulmonary disease with (acute) exacerbation: Secondary | ICD-10-CM | POA: Diagnosis present

## 2014-02-01 DIAGNOSIS — J440 Chronic obstructive pulmonary disease with acute lower respiratory infection: Principal | ICD-10-CM | POA: Diagnosis present

## 2014-02-01 DIAGNOSIS — E785 Hyperlipidemia, unspecified: Secondary | ICD-10-CM | POA: Diagnosis present

## 2014-02-01 DIAGNOSIS — J449 Chronic obstructive pulmonary disease, unspecified: Secondary | ICD-10-CM | POA: Diagnosis present

## 2014-02-01 DIAGNOSIS — R739 Hyperglycemia, unspecified: Secondary | ICD-10-CM | POA: Diagnosis present

## 2014-02-01 DIAGNOSIS — R609 Edema, unspecified: Secondary | ICD-10-CM

## 2014-02-01 DIAGNOSIS — I4891 Unspecified atrial fibrillation: Secondary | ICD-10-CM

## 2014-02-01 DIAGNOSIS — I5033 Acute on chronic diastolic (congestive) heart failure: Secondary | ICD-10-CM | POA: Diagnosis present

## 2014-02-01 LAB — I-STAT TROPONIN, ED: TROPONIN I, POC: 0.01 ng/mL (ref 0.00–0.08)

## 2014-02-01 LAB — CBC
HCT: 34.4 % — ABNORMAL LOW (ref 36.0–46.0)
Hemoglobin: 10.9 g/dL — ABNORMAL LOW (ref 12.0–15.0)
MCH: 28.3 pg (ref 26.0–34.0)
MCHC: 31.7 g/dL (ref 30.0–36.0)
MCV: 89.4 fL (ref 78.0–100.0)
PLATELETS: 179 10*3/uL (ref 150–400)
RBC: 3.85 MIL/uL — ABNORMAL LOW (ref 3.87–5.11)
RDW: 13.4 % (ref 11.5–15.5)
WBC: 8.4 10*3/uL (ref 4.0–10.5)

## 2014-02-01 LAB — BASIC METABOLIC PANEL
ANION GAP: 10 (ref 5–15)
BUN: 10 mg/dL (ref 6–23)
CALCIUM: 10 mg/dL (ref 8.4–10.5)
CO2: 32 meq/L (ref 19–32)
Chloride: 96 mEq/L (ref 96–112)
Creatinine, Ser: 0.91 mg/dL (ref 0.50–1.10)
GFR calc Af Amer: 66 mL/min — ABNORMAL LOW (ref >90)
GFR, EST NON AFRICAN AMERICAN: 57 mL/min — AB (ref >90)
Glucose, Bld: 151 mg/dL — ABNORMAL HIGH (ref 70–99)
Potassium: 3.2 mEq/L — ABNORMAL LOW (ref 3.7–5.3)
SODIUM: 138 meq/L (ref 137–147)

## 2014-02-01 LAB — PRO B NATRIURETIC PEPTIDE: Pro B Natriuretic peptide (BNP): 490.9 pg/mL — ABNORMAL HIGH (ref 0–450)

## 2014-02-01 MED ORDER — METOPROLOL SUCCINATE ER 25 MG PO TB24
25.0000 mg | ORAL_TABLET | Freq: Every day | ORAL | Status: DC
  Administered 2014-02-01 – 2014-02-04 (×4): 25 mg via ORAL
  Filled 2014-02-01 (×4): qty 1

## 2014-02-01 MED ORDER — DILTIAZEM HCL 25 MG/5ML IV SOLN
20.0000 mg | INTRAVENOUS | Status: DC | PRN
  Administered 2014-02-01 (×2): 20 mg via INTRAVENOUS
  Filled 2014-02-01 (×3): qty 5

## 2014-02-01 MED ORDER — FUROSEMIDE 10 MG/ML IJ SOLN
40.0000 mg | Freq: Once | INTRAMUSCULAR | Status: AC
  Administered 2014-02-01: 40 mg via INTRAVENOUS
  Filled 2014-02-01: qty 4

## 2014-02-01 MED ORDER — BENZONATATE 100 MG PO CAPS
100.0000 mg | ORAL_CAPSULE | Freq: Three times a day (TID) | ORAL | Status: DC
  Administered 2014-02-01 – 2014-02-04 (×9): 100 mg via ORAL
  Filled 2014-02-01 (×11): qty 1

## 2014-02-01 MED ORDER — LOSARTAN POTASSIUM 50 MG PO TABS
50.0000 mg | ORAL_TABLET | Freq: Every day | ORAL | Status: DC
  Administered 2014-02-01 – 2014-02-04 (×4): 50 mg via ORAL
  Filled 2014-02-01 (×4): qty 1

## 2014-02-01 MED ORDER — SODIUM CHLORIDE 0.9 % IJ SOLN
3.0000 mL | Freq: Two times a day (BID) | INTRAMUSCULAR | Status: DC
  Administered 2014-02-01 – 2014-02-03 (×6): 3 mL via INTRAVENOUS

## 2014-02-01 MED ORDER — ASPIRIN EC 81 MG PO TBEC
81.0000 mg | DELAYED_RELEASE_TABLET | Freq: Every day | ORAL | Status: DC
  Administered 2014-02-01 – 2014-02-04 (×4): 81 mg via ORAL
  Filled 2014-02-01 (×5): qty 1

## 2014-02-01 MED ORDER — FLUTICASONE PROPIONATE 50 MCG/ACT NA SUSP
2.0000 | Freq: Every day | NASAL | Status: DC
  Administered 2014-02-01 – 2014-02-04 (×4): 2 via NASAL
  Filled 2014-02-01: qty 16

## 2014-02-01 MED ORDER — ENOXAPARIN SODIUM 40 MG/0.4ML ~~LOC~~ SOLN
40.0000 mg | SUBCUTANEOUS | Status: DC
  Administered 2014-02-01 – 2014-02-03 (×3): 40 mg via SUBCUTANEOUS
  Filled 2014-02-01 (×4): qty 0.4

## 2014-02-01 MED ORDER — IPRATROPIUM BROMIDE 0.02 % IN SOLN
0.5000 mg | Freq: Four times a day (QID) | RESPIRATORY_TRACT | Status: DC
  Administered 2014-02-01: 0.5 mg via RESPIRATORY_TRACT
  Filled 2014-02-01: qty 2.5

## 2014-02-01 MED ORDER — POTASSIUM CHLORIDE CRYS ER 20 MEQ PO TBCR
20.0000 meq | EXTENDED_RELEASE_TABLET | Freq: Two times a day (BID) | ORAL | Status: DC
  Administered 2014-02-01 – 2014-02-04 (×7): 20 meq via ORAL
  Filled 2014-02-01 (×9): qty 1

## 2014-02-01 MED ORDER — IPRATROPIUM-ALBUTEROL 0.5-2.5 (3) MG/3ML IN SOLN
3.0000 mL | RESPIRATORY_TRACT | Status: DC
Start: 2014-02-02 — End: 2014-02-04
  Administered 2014-02-01 – 2014-02-04 (×12): 3 mL via RESPIRATORY_TRACT
  Filled 2014-02-01 (×14): qty 3

## 2014-02-01 MED ORDER — ATORVASTATIN CALCIUM 20 MG PO TABS
20.0000 mg | ORAL_TABLET | Freq: Every day | ORAL | Status: DC
  Administered 2014-02-01 – 2014-02-03 (×3): 20 mg via ORAL
  Filled 2014-02-01 (×4): qty 1

## 2014-02-01 MED ORDER — PREDNISONE 20 MG PO TABS
40.0000 mg | ORAL_TABLET | Freq: Every day | ORAL | Status: DC
  Administered 2014-02-02 – 2014-02-04 (×3): 40 mg via ORAL
  Filled 2014-02-01 (×4): qty 2

## 2014-02-01 MED ORDER — BUDESONIDE-FORMOTEROL FUMARATE 160-4.5 MCG/ACT IN AERO
2.0000 | INHALATION_SPRAY | Freq: Two times a day (BID) | RESPIRATORY_TRACT | Status: DC
  Administered 2014-02-01 – 2014-02-04 (×6): 2 via RESPIRATORY_TRACT
  Filled 2014-02-01: qty 6

## 2014-02-01 MED ORDER — POLYETHYL GLYCOL-PROPYL GLYCOL 0.4-0.3 % OP SOLN: 2.0000 [drp] | Freq: Every day | OPHTHALMIC | Status: DC | PRN

## 2014-02-01 MED ORDER — LEVOFLOXACIN 750 MG PO TABS
750.0000 mg | ORAL_TABLET | Freq: Every day | ORAL | Status: DC
  Administered 2014-02-01 – 2014-02-04 (×4): 750 mg via ORAL
  Filled 2014-02-01 (×4): qty 1

## 2014-02-01 MED ORDER — ALBUTEROL SULFATE (2.5 MG/3ML) 0.083% IN NEBU: 2.5000 mg | INHALATION_SOLUTION | Freq: Four times a day (QID) | RESPIRATORY_TRACT | Status: DC

## 2014-02-01 MED ORDER — SODIUM CHLORIDE 0.9 % IJ SOLN: 3.0000 mL | INTRAMUSCULAR | Status: DC | PRN

## 2014-02-01 MED ORDER — ALBUTEROL SULFATE (2.5 MG/3ML) 0.083% IN NEBU
2.5000 mg | INHALATION_SOLUTION | Freq: Four times a day (QID) | RESPIRATORY_TRACT | Status: DC | PRN
  Administered 2014-02-03: 2.5 mg via RESPIRATORY_TRACT
  Filled 2014-02-01: qty 3

## 2014-02-01 MED ORDER — PANTOPRAZOLE SODIUM 40 MG PO TBEC
40.0000 mg | DELAYED_RELEASE_TABLET | Freq: Every day | ORAL | Status: DC
  Administered 2014-02-02 – 2014-02-04 (×3): 40 mg via ORAL
  Filled 2014-02-01 (×3): qty 1

## 2014-02-01 MED ORDER — ALBUTEROL SULFATE HFA 108 (90 BASE) MCG/ACT IN AERS: 1.0000 | INHALATION_SPRAY | Freq: Four times a day (QID) | RESPIRATORY_TRACT | Status: DC | PRN

## 2014-02-01 MED ORDER — SODIUM CHLORIDE 0.9 % IJ SOLN
3.0000 mL | Freq: Two times a day (BID) | INTRAMUSCULAR | Status: DC
  Administered 2014-02-01 – 2014-02-02 (×3): 3 mL via INTRAVENOUS

## 2014-02-01 MED ORDER — POLYVINYL ALCOHOL 1.4 % OP SOLN
2.0000 [drp] | OPHTHALMIC | Status: DC | PRN
  Filled 2014-02-01: qty 15

## 2014-02-01 MED ORDER — FUROSEMIDE 40 MG PO TABS
40.0000 mg | ORAL_TABLET | Freq: Two times a day (BID) | ORAL | Status: DC
  Administered 2014-02-01 – 2014-02-04 (×6): 40 mg via ORAL
  Filled 2014-02-01 (×8): qty 1

## 2014-02-01 MED ORDER — ALBUTEROL SULFATE (2.5 MG/3ML) 0.083% IN NEBU
2.5000 mg | INHALATION_SOLUTION | Freq: Four times a day (QID) | RESPIRATORY_TRACT | Status: DC
  Administered 2014-02-01: 2.5 mg via RESPIRATORY_TRACT
  Filled 2014-02-01: qty 3

## 2014-02-01 MED ORDER — IPRATROPIUM BROMIDE 0.02 % IN SOLN: 0.5000 mg | Freq: Four times a day (QID) | RESPIRATORY_TRACT | Status: DC

## 2014-02-01 MED ORDER — DILTIAZEM HCL ER COATED BEADS 300 MG PO CP24
300.0000 mg | ORAL_CAPSULE | Freq: Every day | ORAL | Status: DC
  Administered 2014-02-01 – 2014-02-04 (×4): 300 mg via ORAL
  Filled 2014-02-01 (×5): qty 1

## 2014-02-01 MED ORDER — SODIUM CHLORIDE 0.9 % IV SOLN: 250.0000 mL | INTRAVENOUS | Status: DC | PRN

## 2014-02-01 MED ORDER — SALINE SPRAY 0.65 % NA SOLN
1.0000 | NASAL | Status: DC | PRN
  Administered 2014-02-03: 1 via NASAL
  Filled 2014-02-01: qty 44

## 2014-02-01 MED ORDER — ACETAMINOPHEN 500 MG PO TABS
1000.0000 mg | ORAL_TABLET | Freq: Four times a day (QID) | ORAL | Status: DC | PRN
  Administered 2014-02-02: 500 mg via ORAL
  Filled 2014-02-01: qty 2

## 2014-02-01 NOTE — ED Notes (Addendum)
EMS called to pt home for SOB.  Pt states fever and sob x 4 days - states she has been unable to use inhaler b/c it has been across room.  When EMS arrived pt was in tripod position.  Diminished throughout, 95% on her home 2L.  EMS gave 10/0.5, 125 solumedrol.  RR improved to 24 rr from 30.  ao x 4.  12 lead showed afib - pt c/o L upper chest pain when she coughs.

## 2014-02-01 NOTE — ED Notes (Addendum)
Pt assisted to use bedside commode.  O2 sats decreased to 88% on 2L, but pt recovered once in bed.  Dr Fayrene FearingJames notified.

## 2014-02-01 NOTE — ED Provider Notes (Signed)
CSN: [id number]     Arrival date & time 02/01/14  1191 History   First MD Initiated Contact with Patient 02/01/14 [phone]     Chief Complaint  Patient presents with  . Shortness of Breath      HPI  Patient presents with shortness of breath over last several days. Not using nebulizer over last 24 hours because she states it was across the room and she was too short of breath to get to it. Tripoding upon arrival of paramedics. Given continuous albuterol in route. Given IV Solu-Medrol 125 mg. Tachycardic with rapid A. fib en route.  Denies chest pain. Has cough. Nonproductive. No hemoptysis. Chronic unchanged lower extremity edema.  No change in medications. Also Dr. Myrtis Ser of cardiology, Dr. Vassie Loll of pulmonary. History of COPD, not on home O2.  Past Medical History  Diagnosis Date  . CAD (coronary artery disease)     a. s/p CABG x5 (2005) b. s/p BMS-prox SVG-D2 (2014)  . Hypertension   . Hyperlipidemia   . GI bleed     AVMs  . Aneurysm, aortic     thoracic aorta, stable at 4.1 cm, chest CT, May, 2012  . Syncope     Nitroglycerin plus a diuretic, April, 2009  . Hyponatremia     Chronic. Felt secondary to SIADH   . COPD (chronic obstructive pulmonary disease)   . Tobacco abuse   . Bradycardia   . Carotid artery disease     Hx of endarterectomy. Doppler October, 2011, stable, 0-39% RIC A., 40-59% LICA  . Tuberculosis     Exposures to tuberculosis 1970s, has tested negative by the health Department  . Atrial fibrillation     Paroxysmal with RVR 07/2011, not felt to be a coumadin candidate secondary to hx of GIB/AVM  . Venous stasis of lower extremity     Chronic  . Ejection fraction     a. EF 55-60%, echo, April, 2013 b. EF 55-60%, mild biatrial enlargement and PASP 42 mmH  . CHF with left ventricular diastolic dysfunction, NYHA class 2   . AAA (abdominal aortic aneurysm)   . On home O2     2L N/C  . Complication of anesthesia   . Shortness of breath   . Hx of CABG     CABG 2005   . Hematoma     Right groin hematoma, small, post cath, April, 2014  . Mitral regurgitation     Mild, hospital, March, 2014  . Leg ulcer     Ulcerated lesion on the anterior aspect of her left lower leg, June, 2014   Past Surgical History  Procedure Laterality Date  . Cholecystectomy    . Carotid endartercetomy    . Abdominal hysterectomy    . Coronary artery bypass graft  2005  . Coronary angioplasty with stent placement  07/18/12    severe native coronary artery disease, 99% proximal stenosis of the SVG-D2 status post successful PTCA/PCI with a Veriflex bare-metal stent, widely patent SVGs to both the right PDA sequential OM1 to OM 2, EF 65-70% and 3+ mitral regurgitation   Family History  Problem Relation Age of Onset  . Stroke Sister   . Heart failure Mother   . Cancer Sister     Breast and Lung   . Cancer Brother     breast cancer   History  Substance Use Topics  . Smoking status: Former Smoker -- 0.50 packs/day for 59 years    Types: Cigarettes  Quit date: 02/01/2013  . Smokeless tobacco: Not on file  . Alcohol Use: No   OB History   Grav Para Term Preterm Abortions TAB SAB Ect Mult Living                 Review of Systems  Constitutional: Negative for fever, chills, diaphoresis, appetite change and fatigue.  HENT: Negative for mouth sores, sore throat and trouble swallowing.   Eyes: Negative for visual disturbance.  Respiratory: Positive for cough and shortness of breath. Negative for chest tightness and wheezing.   Cardiovascular: Positive for leg swelling. Negative for chest pain.  Gastrointestinal: Negative for nausea, vomiting, abdominal pain, diarrhea and abdominal distention.  Endocrine: Negative for polydipsia, polyphagia and polyuria.  Genitourinary: Negative for dysuria, frequency and hematuria.  Musculoskeletal: Negative for gait problem.  Skin: Negative for color change, pallor and rash.  Neurological: Negative for dizziness, syncope,  light-headedness and headaches.  Hematological: Does not bruise/bleed easily.  Psychiatric/Behavioral: Negative for behavioral problems and confusion.      Allergies  Other; Codeine; Sulfonamide derivatives; Warfarin; Penicillins; and Ranitidine hcl  Home Medications   Prior to Admission medications   Medication Sig Start Date End Date Taking? Authorizing Provider  acetaminophen (TYLENOL) 500 MG tablet Take 1,000 mg by mouth every 6 (six) hours as needed for moderate pain.   Yes Historical Provider, MD  albuterol (PROVENTIL HFA;VENTOLIN HFA) 108 (90 BASE) MCG/ACT inhaler Inhale 2 puffs into the lungs every 6 (six) hours as needed for wheezing or shortness of breath.   Yes Historical Provider, MD  albuterol (PROVENTIL) (2.5 MG/3ML) 0.083% nebulizer solution Take 2.5 mg by nebulization every 6 (six) hours as needed for wheezing.   Yes Historical Provider, MD  aspirin EC 81 MG tablet Take 81 mg by mouth daily.   Yes Historical Provider, MD  atorvastatin (LIPITOR) 40 MG tablet Take 0.5 tablets (20 mg total) by mouth daily at 6 PM. 11/07/13  Yes Rosalio Macadamia, NP  budesonide-formoterol (SYMBICORT) 160-4.5 MCG/ACT inhaler Inhale 2 puffs into the lungs 2 (two) times daily.   Yes Historical Provider, MD  diltiazem (CARDIZEM CD) 300 MG 24 hr capsule Take 1 capsule (300 mg total) by mouth daily. 11/19/13  Yes Rhetta Mura, MD  furosemide (LASIX) 40 MG tablet Take 1 tablet (40 mg total) by mouth 2 (two) times daily. 08/29/13  Yes Luis Abed, MD  IPRATROPIUM BROMIDE NA Place 2 sprays into the nose daily as needed (for stuffiness/wheezing).    Yes Historical Provider, MD  losartan (COZAAR) 50 MG tablet Take 50 mg by mouth daily.   Yes Historical Provider, MD  metoprolol succinate (TOPROL-XL) 25 MG 24 hr tablet Take 1 tablet (25 mg total) by mouth daily. 11/24/13  Yes Luis Abed, MD  Multiple Vitamins-Minerals (PRESERVISION AREDS 2 PO) Take 1 tablet by mouth 2 (two) times daily.   Yes  Historical Provider, MD  pantoprazole (PROTONIX) 40 MG tablet Take 40 mg by mouth daily.   Yes Historical Provider, MD  Polyethyl Glycol-Propyl Glycol (SYSTANE) 0.4-0.3 % SOLN Apply 2 drops to eye daily as needed (for dry eyes).   Yes Historical Provider, MD  potassium chloride SA (K-DUR,KLOR-CON) 20 MEQ tablet Take 20 mEq by mouth 2 (two) times daily.   Yes Historical Provider, MD  tiotropium (SPIRIVA) 18 MCG inhalation capsule Place 18 mcg into inhaler and inhale daily.    Yes Historical Provider, MD  Vitamin D, Ergocalciferol, (DRISDOL) 50000 UNITS CAPS Take 50,000 Units by mouth every 14 (  fourteen) days. Every other Sunday   Yes Historical Provider, MD  nitroGLYCERIN (NITROSTAT) 0.4 MG SL tablet Place 0.4 mg under the tongue every 5 (five) minutes as needed for chest pain.    Historical Provider, MD  predniSONE (DELTASONE) 20 MG tablet 3 tabs po day one, then 2 po daily x 4 days 11/15/13   Brandt LoosenJulie Manly, MD   BP 124/60  Pulse 85  Temp(Src) 97.9 F (36.6 C) (Oral)  Resp 23  Ht 5\' 11"  (1.803 m)  Wt 187 lb (84.823 kg)  BMI 26.09 kg/m2  SpO2 98% Physical Exam  Constitutional: She is oriented to person, place, and time. She appears well-developed and well-nourished. No distress.  Dyspneic with what mild respiratory distress.  She feels improvement after completion of the albuterol nebulized treatment  HENT:  Head: Normocephalic.  Eyes: Conjunctivae are normal. Pupils are equal, round, and reactive to light. No scleral icterus.  Neck: Normal range of motion. Neck supple. No thyromegaly present.  Cardiovascular: An irregularly irregular rhythm present. Tachycardia present.  Exam reveals no gallop, no S3, no S4 and no friction rub.   No murmur heard. A. fib with RVR on the monitor. No JVD. No gallop.  Pulmonary/Chest: Effort normal and breath sounds normal. No respiratory distress. She has no wheezes. She has no rales.  Abdominal: Soft. Bowel sounds are normal. She exhibits no distension. There  is no tenderness. There is no rebound.  Musculoskeletal: Normal range of motion.  Neurological: She is alert and oriented to person, place, and time.  Skin: Skin is warm and dry. No rash noted.  Bilateral 1+ symmetric lower extremity edema.  Psychiatric: She has a normal mood and affect. Her behavior is normal.    ED Course  Procedures (including critical care time) Labs Review Labs Reviewed  BASIC METABOLIC PANEL - Abnormal; Notable for the following:    Potassium 3.2 (*)    Glucose, Bld 151 (*)    GFR calc non Af Amer 57 (*)    GFR calc Af Amer 66 (*)    All other components within normal limits  CBC - Abnormal; Notable for the following:    RBC 3.85 (*)    Hemoglobin 10.9 (*)    HCT 34.4 (*)    All other components within normal limits  PRO B NATRIURETIC PEPTIDE - Abnormal; Notable for the following:    Pro B Natriuretic peptide (BNP) 490.9 (*)    All other components within normal limits  I-STAT TROPOININ, ED    Imaging Review Dg Chest 2 View  02/01/2014   CLINICAL DATA:  Weakness, shortness of breath, dizziness, cough with left lower chest pain; all symptoms intermittent for the past few weeks; history of COPD, CHF, and aortic valve disease  EXAM: CHEST  2 VIEW  COMPARISON:  Portable chest x-ray of February 01, 2014  FINDINGS: The lungs are hyperinflated with hemidiaphragm flattening. There are small amounts of pleural fluid blunting the posterior costophrenic angles. The cardiac silhouette is top-normal in size. The pulmonary vascularity is engorged. The interstitial markings are increased diffusely. The patient has undergone previous CABG. The bony thorax exhibits no acute abnormality.  IMPRESSION: COPD with interstitial edema likely of cardiac cause. One cannot exclude superimposed acute bronchitis in the appropriate clinical setting.   Electronically Signed   By: David  [place]   On: 02/01/2014 12:02   Dg Chest Portable 1 View  02/01/2014   CLINICAL DATA:  80 year old female  presenting with recent history of shortness of breath,  cough and congestion. History of COPD.  EXAM: PORTABLE CHEST - 1 VIEW  COMPARISON:  Chest x-ray 11/14/2013.  FINDINGS: Compared to prior examinations there is worsening peribronchial cuffing and diffuse interstitial prominence. No confluent consolidative airspace disease. No definite pleural effusions. Heart size is mildly enlarged. Mild cephalization of the pulmonary vasculature. Upper mediastinal contours are distorted by patient positioning, but appear within normal limits. Atherosclerosis in the thoracic aorta. Status post median sternotomy for CABG.  IMPRESSION: 1. Increasing diffuse peribronchial cuffing and interstitial prominence. This could suggest acute bronchitis, however, given the cardiomegaly and cephalization of the pulmonary vasculature, these findings could alternatively be explained by mild congestive heart failure. Clinical correlation is recommended. 2. Atherosclerosis.   Electronically Signed   By: Trudie Reed M.D.   On: 02/01/2014 09:52     EKG Interpretation None      MDM   Final diagnoses:  Atrial fibrillation with rapid ventricular response  COPD exacerbation    EKG shows A. fib with RVR. Chest x-ray suggests cephalization and CHF. BNP not elevated. Patient feels subjectively improved. Requiring 2 L nasal cannula to maintain 93% saturations. Normal troponin.  Given Cardizem 20 mg IV, then repeated 20 mg IV. Heart rate now 100. Continues to be in A. fib. Is chronically in A. fib. Not a Coumadin candidate secondary to recurrent GI bleed secondary to AVM as per her chart. Plan will be admission.  Rolland Porter, MD 02/01/14 1357

## 2014-02-01 NOTE — ED Notes (Signed)
Dr. James at bedside  

## 2014-02-01 NOTE — ED Notes (Signed)
PT HR remains afib 112's.  Dr Fayrene FearingJames notified an 2nd cardizem bolus given.

## 2014-02-01 NOTE — H&P (Signed)
Date: 02/01/2014               Patient Name:  [name] MRN: [id number]  DOB: 1930-04-01 Age / Sex: 80 y.o., female   PCP: Kaleen Mask, MD         Medical Service: Internal Medicine Teaching Service         Attending Physician: Dr. Levert Feinstein, MD    First Contact: Dr. Senaida Ores Pager: [phone]  Second Contact: Dr. Mikey Bussing Pager: [phone]       After Hours (After 5p/  First Contact Pager: [phone]  weekends / holidays): Second Contact Pager: [phone]   Chief Complaint: shortness of breath  History of Present Illness: [name] is a 80 yo female with PMHx of COPD, diastolic CHF with EF 55-60% (NYHA class 2), paroxysmal atrial fibrillation with RVR (not coumadin candidate due to hx of GIB with AVMs), Carotid Artery Disease (1-39% R ICA, 40-59% L ICA as of 01/2010), HTN and HLD who presents to the ED via EMS for complaint of shortness of breath. Patient states she has had increasing shortness of breath over the past several days, but much worse this morning. She awoke this morning and was so short of breath that she could not get out of bed to reach her nebulizer. She called EMS who found her in a tripod position in bed. Patient has had nasal congestion and a productive cough of brown and green sputum for about one week. She admits that her grandson who lives with her has been sick along with her son and daughter in law. She has had increasing shortness of breath on exertion and ambulation. She is chronically on home oxygen usually 2 L via [place], but up to 3 L if she needs it. She has not taken any of her medications today, but has not missed any other doses other than today. She does admit that she has some increased swelling in her legs, even though they stay chronically swollen. Her normal weight fluctuates between 184-189. She admits to heart palpitations but denies any chest pain, nausea, vomiting, abdominal pain or diarrhea.  Meds:  Prescriptions prior to admission    Medication Sig Dispense Refill  . acetaminophen (TYLENOL) 500 MG tablet Take 1,000 mg by mouth every 6 (six) hours as needed for moderate pain.      Marland Kitchen albuterol (PROVENTIL HFA;VENTOLIN HFA) 108 (90 BASE) MCG/ACT inhaler Inhale 2 puffs into the lungs every 6 (six) hours as needed for wheezing or shortness of breath.      Marland Kitchen albuterol (PROVENTIL) (2.5 MG/3ML) 0.083% nebulizer solution Take 2.5 mg by nebulization every 6 (six) hours as needed for wheezing.      Marland Kitchen aspirin EC 81 MG tablet Take 81 mg by mouth daily.      Marland Kitchen atorvastatin (LIPITOR) 40 MG tablet Take 0.5 tablets (20 mg total) by mouth daily at 6 PM.  90 tablet  1  . budesonide-formoterol (SYMBICORT) 160-4.5 MCG/ACT inhaler Inhale 2 puffs into the lungs 2 (two) times daily.      Marland Kitchen diltiazem (CARDIZEM CD) 300 MG 24 hr capsule Take 1 capsule (300 mg total) by mouth daily.  30 capsule  0  . furosemide (LASIX) 40 MG tablet Take 1 tablet (40 mg total) by mouth 2 (two) times daily.  180 tablet  1  . IPRATROPIUM BROMIDE NA Place 2 sprays into the nose daily as needed (for stuffiness/wheezing).       Marland Kitchen losartan (COZAAR) 50 MG tablet  Take 50 mg by mouth daily.      . metoprolol succinate (TOPROL-XL) 25 MG 24 hr tablet Take 1 tablet (25 mg total) by mouth daily.  90 tablet  0  . Multiple Vitamins-Minerals (PRESERVISION AREDS 2 PO) Take 1 tablet by mouth 2 (two) times daily.      . pantoprazole (PROTONIX) 40 MG tablet Take 40 mg by mouth daily.      Bertram Gala. Polyethyl Glycol-Propyl Glycol (SYSTANE) 0.4-0.3 % SOLN Apply 2 drops to eye daily as needed (for dry eyes).      . potassium chloride SA (K-DUR,KLOR-CON) 20 MEQ tablet Take 20 mEq by mouth 2 (two) times daily.      Marland Kitchen. tiotropium (SPIRIVA) 18 MCG inhalation capsule Place 18 mcg into inhaler and inhale daily.       . Vitamin D, Ergocalciferol, (DRISDOL) 50000 UNITS CAPS Take 50,000 Units by mouth every 14 (fourteen) days. Every other Sunday      . nitroGLYCERIN (NITROSTAT) 0.4 MG SL tablet Place 0.4 mg  under the tongue every 5 (five) minutes as needed for chest pain.      . predniSONE (DELTASONE) 20 MG tablet 3 tabs po day one, then 2 po daily x 4 days  11 tablet  0    Current Facility-Administered Medications  Medication Dose Route Frequency Provider Last Rate Last Dose  . diltiazem (CARDIZEM CD) 24 hr capsule 300 mg  300 mg Oral Daily Ky BarbanSolianny D Kennerly, MD      . diltiazem (CARDIZEM) injection 20 mg  20 mg Intravenous Q30 min PRN Rolland PorterMark James, MD 999 mL/hr at 02/01/14 1059 20 mg at 02/01/14 1059  . metoprolol succinate (TOPROL-XL) 24 hr tablet 25 mg  25 mg Oral Daily Ky BarbanSolianny D Kennerly, MD   25 mg at 02/01/14 1444  . potassium chloride SA (K-DUR,KLOR-CON) CR tablet 20 mEq  20 mEq Oral BID Ky BarbanSolianny D Kennerly, MD   20 mEq at 02/01/14 1331  . [START ON 02/02/2014] predniSONE (DELTASONE) tablet 40 mg  40 mg Oral Q breakfast Jill AlexandersAlexa Richardson, MD        Allergies: Allergies as of 02/01/2014 - Review Complete 02/01/2014  Allergen Reaction Noted  . Other Other (See Comments) 04/28/2013  . Codeine Nausea And Vomiting   . Sulfonamide derivatives Nausea And Vomiting   . Warfarin  11/20/2013  . Penicillins Itching, Rash, and Other (See Comments)   . Ranitidine hcl Itching and Rash    Past Medical History  Diagnosis Date  . CAD (coronary artery disease)     a. s/p CABG x5 (2005) b. s/p BMS-prox SVG-D2 (2014)  . Hypertension   . Hyperlipidemia   . GI bleed     AVMs  . Aneurysm, aortic     thoracic aorta, stable at 4.1 cm, chest CT, May, 2012  . Syncope     Nitroglycerin plus a diuretic, April, 2009  . Hyponatremia     Chronic. Felt secondary to SIADH   . COPD (chronic obstructive pulmonary disease)   . Tobacco abuse   . Bradycardia   . Carotid artery disease     Hx of endarterectomy. Doppler October, 2011, stable, 0-39% RIC A., 40-59% LICA  . Tuberculosis     Exposures to tuberculosis 1970s, has tested negative by the health Department  . Atrial fibrillation     Paroxysmal with  RVR 07/2011, not felt to be a coumadin candidate secondary to hx of GIB/AVM  . Venous stasis of lower extremity     Chronic  .  Ejection fraction     a. EF 55-60%, echo, April, 2013 b. EF 55-60%, mild biatrial enlargement and PASP 42 mmH  . CHF with left ventricular diastolic dysfunction, NYHA class 2   . AAA (abdominal aortic aneurysm)   . On home O2     2L N/C  . Complication of anesthesia   . Shortness of breath   . Hx of CABG     CABG 2005  . Hematoma     Right groin hematoma, small, post cath, April, 2014  . Mitral regurgitation     Mild, hospital, March, 2014  . Leg ulcer     Ulcerated lesion on the anterior aspect of her left lower leg, June, 2014   Past Surgical History  Procedure Laterality Date  . Cholecystectomy    . Carotid endartercetomy    . Abdominal hysterectomy    . Coronary artery bypass graft  2005  . Coronary angioplasty with stent placement  07/18/12    severe native coronary artery disease, 99% proximal stenosis of the SVG-D2 status post successful PTCA/PCI with a Veriflex bare-metal stent, widely patent SVGs to both the right PDA sequential OM1 to OM 2, EF 65-70% and 3+ mitral regurgitation   Family History  Problem Relation Age of Onset  . Stroke Sister   . Heart failure Mother   . Cancer Sister     Breast and Lung   . Cancer Brother     breast cancer   History   Social History  . Marital Status: Widowed    Spouse Name: N/A    Number of Children: 1  . Years of Education: N/A   Occupational History  . retired     [employer]   Social History Main Topics  . Smoking status: Former Smoker -- 0.50 packs/day for 59 years    Types: Cigarettes    Quit date: 02/01/2013  . Smokeless tobacco: Not on file  . Alcohol Use: No  . Drug Use: Not on file  . Sexual Activity: Not on file   Other Topics Concern  . Not on file   Social History Narrative  . No narrative on file    Review of Systems: General: Denies fever, chills, fatigue, change in appetite  and diaphoresis. Denies weight gain. HEENT: Admits to nasal congestion.  Respiratory: Admits to SOB, productive cough, DOE, but denies chest tightness, and wheezing.   Cardiovascular: Admits to palpitations. Denies chest pain.  Gastrointestinal: Denies nausea, vomiting, abdominal pain, diarrhea, constipation, blood in stool and abdominal distention.  Genitourinary: Denies dysuria, urgency, frequency, hematuria, suprapubic pain and flank pain. Endocrine: Denies hot or cold intolerance, polyuria, and polydipsia. Musculoskeletal: Admits to swelling in legs. Denies myalgias, back pain, joint swelling, arthralgias and gait problem.  Skin: Denies pallor, rash and wounds.  Neurological: Denies dizziness, headaches, weakness, lightheadedness, numbness,seizures, and syncope, Psychiatric/Behavioral: Denies mood changes, confusion, nervousness, sleep disturbance and agitation.  Physical Exam: Filed Vitals:   02/01/14 1345 02/01/14 1415 02/01/14 1444 02/01/14 1446  BP: 145/113 112/57 124/71   Pulse: 119 116 111   Temp:    97.5 F (36.4 C)  TempSrc:    Oral  Resp: 27 19    Height:      Weight:      SpO2: 96% 98%     General: Vital signs reviewed.  Patient is well-developed and well-nourished, in no acute distress and cooperative with exam.  HEENT: No tenderness on palpation of maxillary or frontal sinuses. Cardiovascular: Tachycardic, irregularly irregular, S1 normal,  S2 normal, no murmurs, gallops, or rubs. Pulmonary/Chest: Decreased breath sounds, mild crackles in lower lung fields, no wheezes or rhonchi. No respiratory distress. No accessory muscle use. Abdominal: Soft, non-tender, non-distended, BS +, no masses, organomegaly, or guarding present.  Musculoskeletal: No joint deformities, erythema, or stiffness, ROM full and nontender. Extremities: 2+ pitting edema in the lower extremities bilaterally up to mid shin, pulses symmetric and intact bilaterally. No cyanosis or clubbing. Skin: Warm,  dry and intact. No rashes or erythema. Psychiatric: Normal mood and affect. speech and behavior is normal. Cognition and memory are normal.   Lab results: Basic Metabolic Panel:  Recent Labs  78/29/56 1000  NA 138  K 3.2*  CL 96  CO2 32  GLUCOSE 151*  BUN 10  CREATININE 0.91  CALCIUM 10.0   CBC:  Recent Labs  02/01/14 1000  WBC 8.4  HGB 10.9*  HCT 34.4*  MCV 89.4  PLT 179   BNP:  Recent Labs  02/01/14 1000  PROBNP 490.9*    Imaging results:  Dg Chest 2 View  02/01/2014   CLINICAL DATA:  Weakness, shortness of breath, dizziness, cough with left lower chest pain; all symptoms intermittent for the past few weeks; history of COPD, CHF, and aortic valve disease  EXAM: CHEST  2 VIEW  COMPARISON:  Portable chest x-ray of February 01, 2014  FINDINGS: The lungs are hyperinflated with hemidiaphragm flattening. There are small amounts of pleural fluid blunting the posterior costophrenic angles. The cardiac silhouette is top-normal in size. The pulmonary vascularity is engorged. The interstitial markings are increased diffusely. The patient has undergone previous CABG. The bony thorax exhibits no acute abnormality.  IMPRESSION: COPD with interstitial edema likely of cardiac cause. One cannot exclude superimposed acute bronchitis in the appropriate clinical setting.   Electronically Signed   By: David  [place]   On: 02/01/2014 12:02   Dg Chest Portable 1 View  02/01/2014   CLINICAL DATA:  80 year old female presenting with recent history of shortness of breath, cough and congestion. History of COPD.  EXAM: PORTABLE CHEST - 1 VIEW  COMPARISON:  Chest x-ray 11/14/2013.  FINDINGS: Compared to prior examinations there is worsening peribronchial cuffing and diffuse interstitial prominence. No confluent consolidative airspace disease. No definite pleural effusions. Heart size is mildly enlarged. Mild cephalization of the pulmonary vasculature. Upper mediastinal contours are distorted by patient  positioning, but appear within normal limits. Atherosclerosis in the thoracic aorta. Status post median sternotomy for CABG.  IMPRESSION: 1. Increasing diffuse peribronchial cuffing and interstitial prominence. This could suggest acute bronchitis, however, given the cardiomegaly and cephalization of the pulmonary vasculature, these findings could alternatively be explained by mild congestive heart failure. Clinical correlation is recommended. 2. Atherosclerosis.   Electronically Signed   By: Trudie Reed M.D.   On: 02/01/2014 09:52    Other results: EKG: sinus tachycardia, atrial fibrillation, rate 121.  Assessment & Plan by Problem:  COPD Exacerbation: Patient presents with complain of shortness of breath, productive cough, and nasal congestion worsening over the past several days. Vital signs on admission showed the patient was afebrile, tachycardic, saturating between 90-100% on 2 L via [place], RR 19. Patient has no leukocytosis. 1 view CXR showed increasing diffuse peribronchial cuffing and interstitial prominence suggesting acute bronchitis, but cardiomegaly and cephalization of the pulmonary vasculature, may be indicative of mild congestive heart failure. 2 view CXR showed COPD with interstitial edema likely of cardiac cause, but superimposed acute bronchitis cannot be excluded. EMS gave 125 mg of solumedrol  and gave 10/0.5. Patient is now saturating well on her home oxygen level of 2 L via [place] and is not in respiratory distress. Patient's COPD exacerbation is likely secondary to bronchitis versus URI. Patient has 3/3 cardinal COPD exacerbation symptoms: increased dyspnea, increased sputum volume, increased sputum purulence. She is also 80 years old and has history of cardiac disease. She does have risk factors for pseudomonas due to recent hospital admission within the past 90 days. Therefore, patient qualifies for antibiotic treatment with pseudomonas coverage. -CBC/BMET tomorrow  am -Telemetry -Incentive Spirometry -PT/OT -Albuterol inhaler 1-2 puff Q6H prn -Albuterol nebulizer Q6H prn -Benzonatate 100 mg TID -Budesonide-Formoterol 2 puffs BID -Fluticasone 2 sprays daily -Ipratropium nebulizer Q6H  -Levaquin 750 mg daily -Ocean Nasal Spray prn -Will start Prednisone 40 mg daily for 5 days starting tomorrow since patient already received solumedrol via EMS  Acute on Chronic Diastolic CHF: Last echo showed diastolic dysfunction, NYHA class 2, with EF 55-60%. Patient has shortness of breath but denies orthopnea or increased weight gain. She does state she has increased edema in her lower extremities bilaterally and 2+ pitting edema up to mid shin bilaterally. Patient is on Lasix 40 mg BID at home and is within her normal weight range. CXR showed cardiomegaly, cephalization of pulmonary vasculature and interstitial edema indicative of mild CHF. Patient had mild crackles in the lower lung fields on exam. Patient received Lasix 40 mg IV and Cardizem 20 mg IV in the ED. It is likely patient is in a concomitant mild CHF exacerbation. -Daily Weights -Low Sodium Diet -ASA 81 mg daily -Furosemide 40 mg daily -KCl 20 mEq BID -Losartan 50 mg daily -Cardizem 300 mg daily -Metoprolol 25 mg daily  Paroxysmal Atrial Fibrillation with RVR: Patient has history of PAF with RVR. She was deemed not a coumadin candidate due to her history of GIB from AVMs. She is on ASA 81 mg daily at home, Cardizem 300 mg daily and Metoprolol 25 mg daily. She is currently in atrial fibrillation with RVR. Troponin negative x 1. She had not taken any of her medications today. Patient received Cardizem 20 mg IV x 2 in the ED. -Telemetry -Repeat EKG -ASA 81 mg daily -Cardizem 300 mg daily -Metoprolol 25 mg daily  Hypokalemia: Potassium was 3.2 on admission. She is normally on 20 mEq BID at home. Patient received KCl 20 mEq in the ED. -Resume KCl 20 meq BID -BMET tomorrow am  HTN: Blood pressure  134/79 on admission. Patient is on losartan 50 mg daily at home. -Losartan 50 mg daily  HLD: Last lipid profile from 07/19/13 showed cholesterol 121, TG 102, HDL 39, LDL 62. Patient is on Atorvastatin 20 mg daily at home. -Atorvastatin 20 mg daily  CAD s/p CABG in 2005: No chest pain. EKG shows atrial fibrillation and tachycardia. Troponin poc negative. -Repeat EKG -ASA 81 mg daily -Atorvastatin 20 mg daily  Carotid Artery Disease s/p endarterectomy: Carotid Dopplers showed 1-39% stenosis of R ICA and 40-59% stenosis of L ICA in 01/2010.  -ASA 81 mg daily -Atorvastatin 20 mg daily  DVT/PE ppx: Lovenox 40 mg daily  Dispo: Disposition is deferred at this time, awaiting improvement of current medical problems. Anticipated discharge in approximately 1-2 day(s).   The patient does have a current PCP Andrey Campanile Elizebeth Koller, MD) and does not need an [facility] hospital follow-up appointment after discharge.  The patient does not have transportation limitations that hinder transportation to clinic appointments.  Signed: Jill Alexanders, DO PGY-1 Internal Medicine Resident  Pager # [phone] 02/01/2014 3:00 PM

## 2014-02-01 NOTE — ED Notes (Signed)
MD at bedside. 

## 2014-02-02 DIAGNOSIS — I6529 Occlusion and stenosis of unspecified carotid artery: Secondary | ICD-10-CM

## 2014-02-02 DIAGNOSIS — J440 Chronic obstructive pulmonary disease with acute lower respiratory infection: Secondary | ICD-10-CM | POA: Diagnosis present

## 2014-02-02 DIAGNOSIS — J441 Chronic obstructive pulmonary disease with (acute) exacerbation: Secondary | ICD-10-CM

## 2014-02-02 DIAGNOSIS — I2510 Atherosclerotic heart disease of native coronary artery without angina pectoris: Secondary | ICD-10-CM

## 2014-02-02 DIAGNOSIS — I482 Chronic atrial fibrillation: Secondary | ICD-10-CM

## 2014-02-02 DIAGNOSIS — I10 Essential (primary) hypertension: Secondary | ICD-10-CM

## 2014-02-02 LAB — HEMOGLOBIN A1C
Hgb A1c MFr Bld: 5.8 % — ABNORMAL HIGH (ref <5.7)
MEAN PLASMA GLUCOSE: 120 mg/dL — AB (ref <117)

## 2014-02-02 LAB — BASIC METABOLIC PANEL
Anion gap: 20 — ABNORMAL HIGH (ref 5–15)
BUN: 17 mg/dL (ref 6–23)
CALCIUM: 10.1 mg/dL (ref 8.4–10.5)
CHLORIDE: 94 meq/L — AB (ref 96–112)
CO2: 27 meq/L (ref 19–32)
Creatinine, Ser: 1.06 mg/dL (ref 0.50–1.10)
GFR calc Af Amer: 55 mL/min — ABNORMAL LOW (ref >90)
GFR calc non Af Amer: 48 mL/min — ABNORMAL LOW (ref >90)
GLUCOSE: 162 mg/dL — AB (ref 70–99)
Potassium: 3.7 mEq/L (ref 3.7–5.3)
SODIUM: 141 meq/L (ref 137–147)

## 2014-02-02 LAB — CBC
HEMATOCRIT: 32 % — AB (ref 36.0–46.0)
Hemoglobin: 10.4 g/dL — ABNORMAL LOW (ref 12.0–15.0)
MCH: 29.3 pg (ref 26.0–34.0)
MCHC: 32.5 g/dL (ref 30.0–36.0)
MCV: 90.1 fL (ref 78.0–100.0)
Platelets: 195 10*3/uL (ref 150–400)
RBC: 3.55 MIL/uL — ABNORMAL LOW (ref 3.87–5.11)
RDW: 13.3 % (ref 11.5–15.5)
WBC: 8.2 10*3/uL (ref 4.0–10.5)

## 2014-02-02 MED ORDER — GUAIFENESIN ER 600 MG PO TB12
600.0000 mg | ORAL_TABLET | Freq: Two times a day (BID) | ORAL | Status: DC
  Administered 2014-02-02 – 2014-02-04 (×5): 600 mg via ORAL
  Filled 2014-02-02 (×6): qty 1

## 2014-02-02 NOTE — Progress Notes (Signed)
Subjective:  Patient was seen and examined this morning. Patient states that her breathing is somewhat improved from yesterday. She states she continues to have some shortness of breath, but only on ambulation. She continues to have productive cough of greenish brown phlegm. She denies any fever, chest pain, or wheezing.   Objective: Vital signs in last 24 hours:  Weight change:   Intake/Output Summary (Last 24 hours) at 02/02/14 1239 Last data filed at 02/02/14 1040  Gross per 24 hour  Intake    480 ml  Output    775 ml  Net   -295 ml   Filed Vitals:   02/02/14 0101 02/02/14 0452 02/02/14 0904 02/02/14 0935  BP: 139/59 99/59  115/46  Pulse: 107 99  123  Temp: 96.9 F (36.1 C) 97 F (36.1 C)    TempSrc: Oral Oral    Resp: 20 22    Height:      Weight:  86.9 kg (191 lb 9.3 oz)    SpO2: 95% 98% 99%    General: Vital signs reviewed. Patient is well-developed and well-nourished, in no acute distress and cooperative with exam.  Cardiovascular: Tachycardic, irregularly irregular, S1 normal, S2 normal, no murmurs, gallops, or rubs.  Pulmonary/Chest: Decreased breath sounds, mild crackles in lower lung fields, no wheezes or rhonchi. No respiratory distress. No accessory muscle use. Currently on 2 L [place], her home O2 level. Abdominal: Soft, non-tender, non-distended, BS +, no masses, organomegaly, or guarding present.  Musculoskeletal: No joint deformities, erythema, or stiffness, ROM full and nontender.  Extremities: 1+ pitting edema in ankles bilaterally, pulses symmetric and intact bilaterally. No cyanosis or clubbing.  Skin: Warm, dry and intact. No rashes or erythema. Psychiatric: Normal mood and affect. speech and behavior is normal. Cognition and memory are normal.   Lab Results: Basic Metabolic Panel:  Recent Labs Lab 02/01/14 1000 02/02/14 0400  NA 138 141  K 3.2* 3.7  CL 96 94*  CO2 32 27  GLUCOSE 151* 162*  BUN 10 17  CREATININE 0.91 1.06  CALCIUM 10.0 10.1    CBC:  Recent Labs Lab 02/01/14 1000 02/02/14 0400  WBC 8.4 8.2  HGB 10.9* 10.4*  HCT 34.4* 32.0*  MCV 89.4 90.1  PLT 179 195   BNP:  Recent Labs Lab 02/01/14 1000  PROBNP 490.9*   Studies/Results: Dg Chest 2 View  02/01/2014   CLINICAL DATA:  Weakness, shortness of breath, dizziness, cough with left lower chest pain; all symptoms intermittent for the past few weeks; history of COPD, CHF, and aortic valve disease  EXAM: CHEST  2 VIEW  COMPARISON:  Portable chest x-ray of February 01, 2014  FINDINGS: The lungs are hyperinflated with hemidiaphragm flattening. There are small amounts of pleural fluid blunting the posterior costophrenic angles. The cardiac silhouette is top-normal in size. The pulmonary vascularity is engorged. The interstitial markings are increased diffusely. The patient has undergone previous CABG. The bony thorax exhibits no acute abnormality.  IMPRESSION: COPD with interstitial edema likely of cardiac cause. One cannot exclude superimposed acute bronchitis in the appropriate clinical setting.   Electronically Signed   By: David  [place]   On: 02/01/2014 12:02   Dg Chest Portable 1 View  02/01/2014   CLINICAL DATA:  80 year old female presenting with recent history of shortness of breath, cough and congestion. History of COPD.  EXAM: PORTABLE CHEST - 1 VIEW  COMPARISON:  Chest x-ray 11/14/2013.  FINDINGS: Compared to prior examinations there is worsening peribronchial cuffing and diffuse  interstitial prominence. No confluent consolidative airspace disease. No definite pleural effusions. Heart size is mildly enlarged. Mild cephalization of the pulmonary vasculature. Upper mediastinal contours are distorted by patient positioning, but appear within normal limits. Atherosclerosis in the thoracic aorta. Status post median sternotomy for CABG.  IMPRESSION: 1. Increasing diffuse peribronchial cuffing and interstitial prominence. This could suggest acute bronchitis, however, given  the cardiomegaly and cephalization of the pulmonary vasculature, these findings could alternatively be explained by mild congestive heart failure. Clinical correlation is recommended. 2. Atherosclerosis.   Electronically Signed   By: Trudie Reed M.D.   On: 02/01/2014 09:52   Medications:  I have reviewed the patient's current medications. Prior to Admission:  Prescriptions prior to admission  Medication Sig Dispense Refill  . acetaminophen (TYLENOL) 500 MG tablet Take 1,000 mg by mouth every 6 (six) hours as needed for moderate pain.      Marland Kitchen albuterol (PROVENTIL HFA;VENTOLIN HFA) 108 (90 BASE) MCG/ACT inhaler Inhale 2 puffs into the lungs every 6 (six) hours as needed for wheezing or shortness of breath.      Marland Kitchen albuterol (PROVENTIL) (2.5 MG/3ML) 0.083% nebulizer solution Take 2.5 mg by nebulization every 6 (six) hours as needed for wheezing.      Marland Kitchen aspirin EC 81 MG tablet Take 81 mg by mouth daily.      Marland Kitchen atorvastatin (LIPITOR) 40 MG tablet Take 0.5 tablets (20 mg total) by mouth daily at 6 PM.  90 tablet  1  . budesonide-formoterol (SYMBICORT) 160-4.5 MCG/ACT inhaler Inhale 2 puffs into the lungs 2 (two) times daily.      Marland Kitchen diltiazem (CARDIZEM CD) 300 MG 24 hr capsule Take 1 capsule (300 mg total) by mouth daily.  30 capsule  0  . furosemide (LASIX) 40 MG tablet Take 1 tablet (40 mg total) by mouth 2 (two) times daily.  180 tablet  1  . IPRATROPIUM BROMIDE NA Place 2 sprays into the nose daily as needed (for stuffiness/wheezing).       Marland Kitchen losartan (COZAAR) 50 MG tablet Take 50 mg by mouth daily.      . metoprolol succinate (TOPROL-XL) 25 MG 24 hr tablet Take 1 tablet (25 mg total) by mouth daily.  90 tablet  0  . Multiple Vitamins-Minerals (PRESERVISION AREDS 2 PO) Take 1 tablet by mouth 2 (two) times daily.      . pantoprazole (PROTONIX) 40 MG tablet Take 40 mg by mouth daily.      Bertram Gala Glycol-Propyl Glycol (SYSTANE) 0.4-0.3 % SOLN Apply 2 drops to eye daily as needed (for dry eyes).       . potassium chloride SA (K-DUR,KLOR-CON) 20 MEQ tablet Take 20 mEq by mouth 2 (two) times daily.      Marland Kitchen tiotropium (SPIRIVA) 18 MCG inhalation capsule Place 18 mcg into inhaler and inhale daily.       . Vitamin D, Ergocalciferol, (DRISDOL) 50000 UNITS CAPS Take 50,000 Units by mouth every 14 (fourteen) days. Every other Sunday      . nitroGLYCERIN (NITROSTAT) 0.4 MG SL tablet Place 0.4 mg under the tongue every 5 (five) minutes as needed for chest pain.      . predniSONE (DELTASONE) 20 MG tablet 3 tabs po day one, then 2 po daily x 4 days  11 tablet  0   Scheduled Meds: . aspirin EC  81 mg Oral Daily  . atorvastatin  20 mg Oral q1800  . benzonatate  100 mg Oral TID  . budesonide-formoterol  2  puff Inhalation BID  . diltiazem  300 mg Oral Daily  . enoxaparin (LOVENOX) injection  40 mg Subcutaneous Q24H  . fluticasone  2 spray Each Nare Daily  . furosemide  40 mg Oral BID  . guaiFENesin  600 mg Oral BID  . ipratropium-albuterol  3 mL Nebulization Q4H WA  . levofloxacin  750 mg Oral Daily  . losartan  50 mg Oral Daily  . metoprolol succinate  25 mg Oral Daily  . pantoprazole  40 mg Oral QAC breakfast  . potassium chloride SA  20 mEq Oral BID  . predniSONE  40 mg Oral Q breakfast  . sodium chloride  3 mL Intravenous Q12H  . sodium chloride  3 mL Intravenous Q12H   Continuous Infusions:  PRN Meds:.sodium chloride, acetaminophen, albuterol, polyvinyl alcohol, sodium chloride, sodium chloride Assessment/Plan: Principal Problem:   COPD (chronic obstructive pulmonary disease) Active Problems:   Hyperlipidemia   Atrial fibrillation   Chronic diastolic CHF (congestive heart failure)   CAD (coronary artery disease)   COPD exacerbation  COPD Exacerbation: Patient has slightly improved from yesterday. She continues to have a productive cough and shortness of breath with ambulation. She is currently saturating 99% on 2-3 L. [place] . This is her home level of oxygen. She has mild crackles  and decreased breath sounds on lung ausculation. Patient is in no respiratory distress, she is able to sit up in bed and carry on a full conversation with me without shortness of breath. I have contacted respiratory therapy in hopes to get patient on pulmonary toilet, flutter valve and chest physio to improve her breathing. I will add mucinex for her congestion. -CBC/BMET tomorrow am  -Telemetry  -Incentive Spirometry  -PT/OT  -Albuterol inhaler 1-2 puff Q6H prn  -Albuterol nebulizer Q6H prn  -Benzonatate 100 mg TID  -Budesonide-Formoterol 2 puffs BID  -Fluticasone 2 sprays daily  -Ipratropium nebulizer Q6H  -Levaquin 750 mg daily (Start date was 02/01/14) -[place] Nasal Spray prn  -Prednisone 40 mg daily for 5 days (End 02/06/14) -Mucinex 600 mg BID  Acute on Chronic Diastolic CHF: Weight up from 187 to 191 today. Patient has mild crackles on lung exam, but lower extremity edema improved from yesterday. 1+ pitting edema in ankles bilaterally. Not likely in an acute CHF exacerbation. -Daily Weights  -Low Sodium Diet  -ASA 81 mg daily  -Furosemide 40 mg daily  -KCl 20 mEq BID  -Losartan 50 mg daily  -Cardizem 300 mg daily  -Metoprolol 25 mg daily   Paroxysmal Atrial Fibrillation with RVR: Patient is still in atrial fibrillation, HR 97-123 this morning. Patient is asymptomatic. Tachycardia likely exacerbated by breathing treatments. We will continue to monitor. Patient may require additional Cardizem.  -Telemetry  -Repeat EKG  -ASA 81 mg daily  -Cardizem 300 mg daily  -Metoprolol 25 mg daily   Hypokalemia: Potassium was 3.7 this morning. Resolved. She is normally on 20 mEq BID at home. -Continue KCl 20 meq BID  -BMET tomorrow am   HTN: Patient is on losartan 50 mg daily at home. Blood pressure has been low normotensive, 99/59-115/46 this morning. We will hold medications if <90/60.  -Losartan 50 mg daily  -Cardizem 300 mg daily -Metoprolol 25 mg daily  Hyperglycemia: Blood  glucose 130s-150s during admission, likely exacerbated by steroid use. Patient's last hemoglobin A1C was early 2014, 5.9. Consider correction if persistently elevated. Patient should follow up with PCP for pre-diabetes management. -Monitor -Recheck hemoglobin a1c  DVT/PE ppx: Lovenox 40 mg daily  Dispo: Disposition is deferred at this time, awaiting improvement of current medical problems.  Anticipated discharge in approximately 1-2 day(s).   The patient does have a current PCP Andrey Campanile Elizebeth Koller, MD) and does not need an [facility] hospital follow-up appointment after discharge.  The patient does have transportation limitations that hinder transportation to clinic appointments.  .Services Needed at time of discharge: Y = Yes, Blank = No PT:   OT:   RN:   Equipment:   Other:     LOS: 1 day   Jill Alexanders, DO PGY-1 Internal Medicine Resident Pager # [phone] 02/02/2014 12:39 PM

## 2014-02-02 NOTE — Care Management Note (Addendum)
    Page 1 of 2   02/04/2014     4:08:28 PM CARE MANAGEMENT NOTE 02/04/2014  Patient:  [name], [name]   Account Number:  [id number]  Date Initiated:  02/02/2014  Documentation initiated by:  [facility]  Subjective/Objective Assessment:   80 yo female with PMHx of COPD, diastolic CHF, paroxysmal Afib with RVR, CAD, HTN; in with SOB.//Home alone.     Action/Plan:   Steroids, nebs, abx.//Access for additional disposition needs; pt has DME oxygen.   Anticipated DC Date:  02/05/2014   Anticipated DC Plan:  [place]  CM consult      [facility] Choice  HOME HEALTH   Choice offered to / List presented to:  C-1 Patient      DME agency  [place] arranged  [place] RN      [place].   Status of service:  Completed, signed off Medicare Important Message given?   (If response is "NO", the following Medicare IM given date fields will be blank) Date Medicare IM given:   Medicare IM given by:   Date Additional Medicare IM given:   Additional Medicare IM given by:    Discharge Disposition:  [place]  Per UR Regulation:  Reviewed for med. necessity/level of care/duration of stay  If discussed at [place] of Stay Meetings, dates discussed:    Comments:  02/04/14 11:40 [place] states pt's insurance does not cover HHPT without HHRN (which pt could benefit from as she is oxygen-dependent obstructive airway disease); CM requests RN Vickii Chafe to get [facility] order.  CM notes Peggy discharged pt without getting order and CM calls MD for [place].  Md give CM TO for [facility].  CM calls [facility] rep, Colletta [place] to notify her [facility] order is added on.  No other Cm needs were communicated.  Mariane Masters, BSN, [place] [phone].  02/04/14 11:35 CM met with pt in room to offer choice of home health agency.  Pt chooses [facility] to render HHPT. Address and contact information verified with pt.  Pt has tank of O2 in hospital  closet for transport home.  No otherDME needed as pt states she has rolling walker and cane at home.  Referral called to [facility] rep, Stephanie.  No other CM needs were ocmmunicated.  Mariane Masters, BSN, [place] [phone].  02/02/14 [place], RN, BSN, [employer] [phone] Spoke to pt at bedside.  Pt states that DME oxygen provided by [facility].  NCM reminded pt and family to have oxygen tank brought from home to hospital prior to discharge for transport.  Pt verbalizes understanding.

## 2014-02-02 NOTE — H&P (Signed)
Medicine attending admission note: I personally interviewed and examined this patient, reviewed the lab database and pertinent radiographs. History, physical examination, evaluation, and management plan, accurate as recorded by resident physician Dr. Jill AlexandersAlexa Richardson.  Clinical summary: 80 year old woman with oxygen-dependent obstructive airway disease, paroxysmal atrial fibrillation but not on chronic anticoagulation secondary to increased risk from arteriovenous malformations of the bowel. There is a questionable history of grade 2 diastolic heart failure but she had a normal echocardiogram one year ago in March 2014 with normal left ventricular systolic and diastolic dysfunction not mentioned on that study. She presents with increasing dyspnea and a productive cough without fever. Other family members in her house have also had bronchitis. On initial exam she was in no acute distress. oxygen saturation 96% on 2 L nasal cannula oxygen. Afebrile 97.5. Irregularly irregular cardiac rhythm with pulse up to 119. Decreased breath sounds over the lungs. Mild rales in the lower lung fields. No wheezing or rhonchi. 2+ pitting edema of the lower extremities. White count 8400. Pro BNP 491 Hemoglobin 10.9 with MCV 89 Electrocardiogram with atrial fibrillation rate up to 121 per minute, nonspecific ST and T-wave changes, no acute ischemic change. Chest radiograph which I personally reviewed shows signs of obstructive airway disease with hyperinflation and hemidiaphragm flattening. Heart upper limit of normal size. Increased interstitial markings. No gross infiltrates.  Treatment initiated with antibiotics, steroids, and bronchodilators. She received one dose of a parenteral diuretic. Oral diuretics will be continued.  Current exam: Pleasant Caucasian woman in no distress Blood pressure 129/52, pulse 74, temperature 97.6 F (36.4 C), temperature source Oral, resp. rate 20, height 5\' 11"  (1.803 m), weight 191  lb 9.3 oz (86.9 kg), SpO2 100.00%. Diffuse decrease in breath sounds over the lung fields which are hyperresonant to percussion. Minimal rales at the right lung base. No wheezing. Irregularly irregular cardiac rhythm. 1+ peripheral edema.  Impression: #1. Acute exacerbation of chronic obstructive airway disease secondary to acute bronchitis. #2. Probable early right sided heart failure related to #1. #3. Chronic atrial fibrillation #4. Coronary artery disease with history of bypass surgery #5. Carotid artery disease status post endarterectomy. #6. Essential hypertension  Plan: As outlined in medical resident note.  Cephas DarbyJames Saryna Kneeland, MD, FACP  Hematology-Oncology/Internal Medicine

## 2014-02-02 NOTE — Evaluation (Signed)
Physical Therapy Evaluation Patient Details Name: [name] MRN: [id number] DOB: [birth year] Today's Date: 02/02/2014   History of Present Illness  Pt adm with COPD exacerbation, acute on chronic heart failure and A-fib with RVR.   Clinical Impression  Pt admitted with above. Pt currently with functional limitations due to the deficits listed below (see PT Problem List).  Pt will benefit from skilled PT to increase their independence and safety with mobility to allow discharge home with intermittent support of family.      Follow Up Recommendations Home health PT    Equipment Recommendations  None recommended by PT    Recommendations for Other Services       Precautions / Restrictions Precautions Precautions: None      Mobility  Bed Mobility Overal bed mobility: Modified Independent                Transfers Overall transfer level: Needs assistance Equipment used: Rolling walker (2 wheeled) Transfers: Sit to/from Stand Sit to Stand: Supervision            Ambulation/Gait Ambulation/Gait assistance: Min guard Ambulation Distance (Feet): 70 Feet Assistive device: Rolling walker (2 wheeled) Gait Pattern/deviations: Step-through pattern;Decreased step length - right;Decreased step length - left Gait velocity: decr Gait velocity interpretation: Below normal speed for age/gender General Gait Details: Pt required 2 standing rest breaks.  Stairs            Wheelchair Mobility    Modified Rankin (Stroke Patients Only)       Balance Overall balance assessment: Needs assistance Sitting-balance support: No upper extremity supported Sitting balance-Leahy Scale: Good     Standing balance support: No upper extremity supported Standing balance-Leahy Scale: Fair                               Pertinent Vitals/Pain Pain Assessment: No/denies pain    Home Living Family/patient expects to be discharged to:: Private residence Living  Arrangements: Alone Available Help at Discharge: Available PRN/intermittently Type of Home: House Home Access: Ramped entrance     Home Layout: One level Home Equipment: Cane - single point;Hospital bed;Walker - 2 wheels;Walker - standard;Walker - 4 wheels;Bedside commode;Shower seat;Grab bars - tub/shower;Other (comment) (O2) Additional Comments: daughter in law does pt errands and cleaning.      Prior Function Level of Independence: Independent with assistive device(s)         Comments: amb with rollator     Hand Dominance   Dominant Hand: Right    Extremity/Trunk Assessment   Upper Extremity Assessment: Overall WFL for tasks assessed           Lower Extremity Assessment: Generalized weakness         Communication   Communication: No difficulties  Cognition Arousal/Alertness: Awake/alert Behavior During Therapy: WFL for tasks assessed/performed Overall Cognitive Status: Within Functional Limits for tasks assessed                      General Comments      Exercises        Assessment/Plan    PT Assessment    PT Diagnosis Generalized weakness;Difficulty walking   PT Problem List    PT Treatment Interventions     PT Goals (Current goals can be found in the Care Plan section) Acute Rehab PT Goals Patient Stated Goal: Return home PT Goal Formulation: With patient Time For Goal Achievement: 02/09/14 Potential to Achieve  Goals: Good    Frequency     Barriers to discharge        Co-evaluation               End of Session Equipment Utilized During Treatment: Oxygen Activity Tolerance: Patient limited by fatigue Patient left: in bed;with call bell/phone within reach Nurse Communication: Mobility status         Time: [id number] PT Time Calculation (min): 15 min   Charges:   PT Evaluation $Initial PT Evaluation Tier I: 1 Procedure PT Treatments $Gait Training: 8-22 mins   PT G Codes:          Coron Rossano 02/02/2014,  1:55 PM  [facility] [phone]

## 2014-02-02 NOTE — Progress Notes (Signed)
PT Cancellation Note  Patient Details Name: [name] MRN: [id number] DOB: Nov 07, 1929   Cancelled Treatment:    Reason Eval/Treat Not Completed: Medical issues which prohibited therapy (HR in 140's). Pt receiving morning meds. Will return later this AM.   [facility],Neveen Daponte 02/02/2014, 9:42 AM  Skip Mayerary Tate Zagal PT [phone]

## 2014-02-03 DIAGNOSIS — I5032 Chronic diastolic (congestive) heart failure: Secondary | ICD-10-CM

## 2014-02-03 DIAGNOSIS — E876 Hypokalemia: Secondary | ICD-10-CM

## 2014-02-03 DIAGNOSIS — R739 Hyperglycemia, unspecified: Secondary | ICD-10-CM

## 2014-02-03 DIAGNOSIS — I480 Paroxysmal atrial fibrillation: Secondary | ICD-10-CM

## 2014-02-03 LAB — CBC
HCT: 29.7 % — ABNORMAL LOW (ref 36.0–46.0)
HEMOGLOBIN: 9.8 g/dL — AB (ref 12.0–15.0)
MCH: 28.9 pg (ref 26.0–34.0)
MCHC: 33 g/dL (ref 30.0–36.0)
MCV: 87.6 fL (ref 78.0–100.0)
Platelets: 221 10*3/uL (ref 150–400)
RBC: 3.39 MIL/uL — AB (ref 3.87–5.11)
RDW: 13.4 % (ref 11.5–15.5)
WBC: 9.1 10*3/uL (ref 4.0–10.5)

## 2014-02-03 LAB — BASIC METABOLIC PANEL
Anion gap: 11 (ref 5–15)
BUN: 27 mg/dL — ABNORMAL HIGH (ref 6–23)
CHLORIDE: 98 meq/L (ref 96–112)
CO2: 29 meq/L (ref 19–32)
Calcium: 10 mg/dL (ref 8.4–10.5)
Creatinine, Ser: 1.17 mg/dL — ABNORMAL HIGH (ref 0.50–1.10)
GFR calc Af Amer: 49 mL/min — ABNORMAL LOW (ref >90)
GFR, EST NON AFRICAN AMERICAN: 42 mL/min — AB (ref >90)
GLUCOSE: 152 mg/dL — AB (ref 70–99)
Potassium: 4.4 mEq/L (ref 3.7–5.3)
SODIUM: 138 meq/L (ref 137–147)

## 2014-02-03 NOTE — Progress Notes (Signed)
Patient ID: [name], female   DOB: [birth year], 80 y.o.   MRN: [id number] Medicine attending: I personally examined this patient this morning and I concur with the findings and management plan as outlined by resident physician Dr. Harmon DunEric Hoffman. She remains afebrile. Lung exam improved. Diffuse decreased breath sounds. Basilar rales have resolved. No wheezing. Still with an intermittent cough now mobilizing more secretions with mucolytics. She has been transitioned to oral antibiotics with Levaquin. She should be stable for discharge soon.  Cephas DarbyJames Tamotsu Wiederholt, MD, FACP  Hematology-Oncology/Internal Medicine

## 2014-02-03 NOTE — Progress Notes (Signed)
Subjective:  Some improvement in breathing, feels that flutter valve is helping bring mucus up. Does not feel that she can go home today but does not want to go back to SNF.  Objective: Vital signs in last 24 hours:  Weight change: 5 lb 14.4 oz (2.677 kg)  Intake/Output Summary (Last 24 hours) at 02/03/14 1516 Last data filed at 02/03/14 1500  Gross per 24 hour  Intake    240 ml  Output   1601 ml  Net  -1361 ml   Filed Vitals:   02/03/14 0501 02/03/14 0528 02/03/14 0956 02/03/14 1217  BP:   142/68   Pulse:   105   Temp:   97.2 F (36.2 C)   TempSrc:   Oral   Resp:   22   Height:      Weight: 189 lb 11.2 oz (86.047 kg)     SpO2:  99% 93% 98%   General: resting in bed HEENT: PERRL, EOMI, no scleral icterus Cardiac: irregularly irregular, no murmur Pulm: decreased breath sounds, no appreciated rales or wheezing. Abd: soft, nontender, nondistended, BS present Ext: warm and well perfused, 1+ bilaterally pedal edema Neuro: alert and oriented X3, cranial nerves II-XII grossly intact   Lab Results: Basic Metabolic Panel:  Recent Labs Lab 02/02/14 0400 02/03/14 0343  NA 141 138  K 3.7 4.4  CL 94* 98  CO2 27 29  GLUCOSE 162* 152*  BUN 17 27*  CREATININE 1.06 1.17*  CALCIUM 10.1 10.0   CBC:  Recent Labs Lab 02/02/14 0400 02/03/14 0343  WBC 8.2 9.1  HGB 10.4* 9.8*  HCT 32.0* 29.7*  MCV 90.1 87.6  PLT 195 221   BNP:  Recent Labs Lab 02/01/14 1000  PROBNP 490.9*   Studies/Results: No results found. Medications:  I have reviewed the patient's current medications. Prior to Admission:  Prescriptions prior to admission  Medication Sig Dispense Refill  . acetaminophen (TYLENOL) 500 MG tablet Take 1,000 mg by mouth every 6 (six) hours as needed for moderate pain.      Marland Kitchen. albuterol (PROVENTIL HFA;VENTOLIN HFA) 108 (90 BASE) MCG/ACT inhaler Inhale 2 puffs into the lungs every 6 (six) hours as needed for wheezing or shortness of breath.      Marland Kitchen. albuterol  (PROVENTIL) (2.5 MG/3ML) 0.083% nebulizer solution Take 2.5 mg by nebulization every 6 (six) hours as needed for wheezing.      Marland Kitchen. aspirin EC 81 MG tablet Take 81 mg by mouth daily.      Marland Kitchen. atorvastatin (LIPITOR) 40 MG tablet Take 0.5 tablets (20 mg total) by mouth daily at 6 PM.  90 tablet  1  . budesonide-formoterol (SYMBICORT) 160-4.5 MCG/ACT inhaler Inhale 2 puffs into the lungs 2 (two) times daily.      Marland Kitchen. diltiazem (CARDIZEM CD) 300 MG 24 hr capsule Take 1 capsule (300 mg total) by mouth daily.  30 capsule  0  . furosemide (LASIX) 40 MG tablet Take 1 tablet (40 mg total) by mouth 2 (two) times daily.  180 tablet  1  . IPRATROPIUM BROMIDE NA Place 2 sprays into the nose daily as needed (for stuffiness/wheezing).       Marland Kitchen. losartan (COZAAR) 50 MG tablet Take 50 mg by mouth daily.      . metoprolol succinate (TOPROL-XL) 25 MG 24 hr tablet Take 1 tablet (25 mg total) by mouth daily.  90 tablet  0  . Multiple Vitamins-Minerals (PRESERVISION AREDS 2 PO) Take 1 tablet by mouth 2 (two)  times daily.      . pantoprazole (PROTONIX) 40 MG tablet Take 40 mg by mouth daily.      Bertram Gala. Polyethyl Glycol-Propyl Glycol (SYSTANE) 0.4-0.3 % SOLN Apply 2 drops to eye daily as needed (for dry eyes).      . potassium chloride SA (K-DUR,KLOR-CON) 20 MEQ tablet Take 20 mEq by mouth 2 (two) times daily.      Marland Kitchen. tiotropium (SPIRIVA) 18 MCG inhalation capsule Place 18 mcg into inhaler and inhale daily.       . Vitamin D, Ergocalciferol, (DRISDOL) 50000 UNITS CAPS Take 50,000 Units by mouth every 14 (fourteen) days. Every other Sunday      . nitroGLYCERIN (NITROSTAT) 0.4 MG SL tablet Place 0.4 mg under the tongue every 5 (five) minutes as needed for chest pain.      . predniSONE (DELTASONE) 20 MG tablet 3 tabs po day one, then 2 po daily x 4 days  11 tablet  0   Scheduled Meds: . aspirin EC  81 mg Oral Daily  . atorvastatin  20 mg Oral q1800  . benzonatate  100 mg Oral TID  . budesonide-formoterol  2 puff Inhalation BID  .  diltiazem  300 mg Oral Daily  . enoxaparin (LOVENOX) injection  40 mg Subcutaneous Q24H  . fluticasone  2 spray Each Nare Daily  . furosemide  40 mg Oral BID  . guaiFENesin  600 mg Oral BID  . ipratropium-albuterol  3 mL Nebulization Q4H WA  . levofloxacin  750 mg Oral Daily  . losartan  50 mg Oral Daily  . metoprolol succinate  25 mg Oral Daily  . pantoprazole  40 mg Oral QAC breakfast  . potassium chloride SA  20 mEq Oral BID  . predniSONE  40 mg Oral Q breakfast  . sodium chloride  3 mL Intravenous Q12H  . sodium chloride  3 mL Intravenous Q12H   Continuous Infusions:  PRN Meds:.sodium chloride, acetaminophen, albuterol, polyvinyl alcohol, sodium chloride, sodium chloride Assessment/Plan: Principal Problem:   COPD (chronic obstructive pulmonary disease) Active Problems:   Hyperlipidemia   Atrial fibrillation   Chronic diastolic CHF (congestive heart failure)   CAD (coronary artery disease)   COPD exacerbation   Bronchitis, chronic obstructive w acute bronchitis  COPD Exacerbation: Patient continues to improve and lung exam is improved.  In the Chart her O2 sat are 93-99% on 3 L via [place], on my exam she was on 4L.  Will continue current treatment with plan for discharge home in AM. -Incentive Spirometry/Flutter valve -PT/OT  -Albuterol inhaler 1-2 puff Q6H prn  -Albuterol nebulizer Q6H prn  -Benzonatate 100 mg TID  -Budesonide-Formoterol 2 puffs BID  -Fluticasone 2 sprays daily  -Ipratropium nebulizer Q6H  -Levaquin 750 mg PO daily for 5 days (Start date was 02/01/14) -[place]cean Nasal Spray prn  -Prednisone 40 mg daily for 5 days (End 02/06/14) -Mucinex 600 mg BID  Chronic Diastolic CHF: no rales on exam, weight 189 (down 2 lbs from yesterday) -Daily Weights  -Low Sodium Diet  -ASA 81 mg daily  -Furosemide 40 mg daily  -KCl 20 mEq BID  -Losartan 50 mg daily  -Cardizem 300 mg daily  -Metoprolol 25 mg daily   Paroxysmal Atrial Fibrillation: rate controlled -Telemetry    -Repeat EKG  -ASA 81 mg daily  -Cardizem 300 mg daily  -Metoprolol 25 mg daily   Hypokalemia: resolved -Continue KCl 20 meq BID  -BMET tomorrow am   HTN: mildly hypertenisve -Losartan 50 mg daily  -  Cardizem 300 mg daily -Metoprolol 25 mg daily  Hyperglycemia: secondary to steroids -Monitor -hemoglobin a1c 5.8  DVT/PE ppx: Lovenox 40 mg daily  Dispo: Disposition is deferred at this time, awaiting improvement of current medical problems.  Anticipated discharge in approximately 1 day(s).   The patient does have a current PCP Andrey Campanile Elizebeth Koller, MD) and does not need an [facility] hospital follow-up appointment after discharge.  The patient does have transportation limitations that hinder transportation to clinic appointments.  .Services Needed at time of discharge: Y = Yes, Blank = No PT: y  OT:   RN:   Equipment:   Other:     LOS: 2 days   Gust Rung, DO  PGY-2 Internal Medicine Resident Pager # [phone]  02/03/2014 3:16 PM

## 2014-02-04 LAB — BASIC METABOLIC PANEL
Anion gap: 10 (ref 5–15)
BUN: 31 mg/dL — ABNORMAL HIGH (ref 6–23)
CO2: 31 meq/L (ref 19–32)
Calcium: 9.5 mg/dL (ref 8.4–10.5)
Chloride: 97 mEq/L (ref 96–112)
Creatinine, Ser: 1.21 mg/dL — ABNORMAL HIGH (ref 0.50–1.10)
GFR calc Af Amer: 47 mL/min — ABNORMAL LOW (ref >90)
GFR, EST NON AFRICAN AMERICAN: 41 mL/min — AB (ref >90)
Glucose, Bld: 184 mg/dL — ABNORMAL HIGH (ref 70–99)
Potassium: 3.9 mEq/L (ref 3.7–5.3)
SODIUM: 138 meq/L (ref 137–147)

## 2014-02-04 MED ORDER — PREDNISONE 20 MG PO TABS: 40.0000 mg | ORAL_TABLET | Freq: Every day | ORAL | Status: DC

## 2014-02-04 MED ORDER — GUAIFENESIN ER 600 MG PO TB12: 600.0000 mg | ORAL_TABLET | Freq: Two times a day (BID) | ORAL | Status: DC

## 2014-02-04 MED ORDER — LEVOFLOXACIN 750 MG PO TABS: 750.0000 mg | ORAL_TABLET | Freq: Every day | ORAL | Status: DC

## 2014-02-04 MED ORDER — PREDNISONE 20 MG PO TABS: 40.0000 mg | ORAL_TABLET | Freq: Every day | ORAL | Status: AC

## 2014-02-04 MED ORDER — BENZONATATE 100 MG PO CAPS: 100.0000 mg | ORAL_CAPSULE | Freq: Three times a day (TID) | ORAL | Status: DC

## 2014-02-04 NOTE — Progress Notes (Signed)
Subjective: Cough improved, maintaining 100%O2sat on 3 L  Objective: Vital signs in last 24 hours:  Weight change: 3 lb 4.9 oz (1.5 kg)  Intake/Output Summary (Last 24 hours) at 02/04/14 1102 Last data filed at 02/04/14 0900  Gross per 24 hour  Intake    240 ml  Output   1300 ml  Net  -1060 ml   Filed Vitals:   02/03/14 1947 02/03/14 2345 02/04/14 0347 02/04/14 0619  BP: 146/65   140/72  Pulse: 83   80  Temp: 97.7 F (36.5 C)   97.5 F (36.4 C)  TempSrc: Oral   Oral  Resp: 20   18  Height:      Weight:    196 lb 3.4 oz (89 kg)  SpO2: 98% 100% 100% 100%   General: resting in bed HEENT: PERRL, EOMI, no scleral icterus Cardiac: irregularly irregular, no murmur Pulm: decreased breath sounds, no appreciated rales or wheezing. Abd: soft, nontender, nondistended, BS present Ext: warm and well perfused, trace bilaterally pedal edema Neuro: alert and oriented X3, cranial nerves II-XII grossly intact   Lab Results: Basic Metabolic Panel:  Recent Labs Lab 02/03/14 0343 02/04/14 0344  NA 138 138  K 4.4 3.9  CL 98 97  CO2 29 31  GLUCOSE 152* 184*  BUN 27* 31*  CREATININE 1.17* 1.21*  CALCIUM 10.0 9.5   CBC:  Recent Labs Lab 02/02/14 0400 02/03/14 0343  WBC 8.2 9.1  HGB 10.4* 9.8*  HCT 32.0* 29.7*  MCV 90.1 87.6  PLT 195 221   BNP:  Recent Labs Lab 02/01/14 1000  PROBNP 490.9*   Studies/Results: No results found. Medications:  I have reviewed the patient's current medications. Prior to Admission:  Prescriptions prior to admission  Medication Sig Dispense Refill  . acetaminophen (TYLENOL) 500 MG tablet Take 1,000 mg by mouth every 6 (six) hours as needed for moderate pain.      Marland Kitchen. albuterol (PROVENTIL HFA;VENTOLIN HFA) 108 (90 BASE) MCG/ACT inhaler Inhale 2 puffs into the lungs every 6 (six) hours as needed for wheezing or shortness of breath.      Marland Kitchen. albuterol (PROVENTIL) (2.5 MG/3ML) 0.083% nebulizer solution Take 2.5 mg by nebulization every 6  (six) hours as needed for wheezing.      Marland Kitchen. aspirin EC 81 MG tablet Take 81 mg by mouth daily.      Marland Kitchen. atorvastatin (LIPITOR) 40 MG tablet Take 0.5 tablets (20 mg total) by mouth daily at 6 PM.  90 tablet  1  . budesonide-formoterol (SYMBICORT) 160-4.5 MCG/ACT inhaler Inhale 2 puffs into the lungs 2 (two) times daily.      Marland Kitchen. diltiazem (CARDIZEM CD) 300 MG 24 hr capsule Take 1 capsule (300 mg total) by mouth daily.  30 capsule  0  . furosemide (LASIX) 40 MG tablet Take 1 tablet (40 mg total) by mouth 2 (two) times daily.  180 tablet  1  . IPRATROPIUM BROMIDE NA Place 2 sprays into the nose daily as needed (for stuffiness/wheezing).       Marland Kitchen. losartan (COZAAR) 50 MG tablet Take 50 mg by mouth daily.      . metoprolol succinate (TOPROL-XL) 25 MG 24 hr tablet Take 1 tablet (25 mg total) by mouth daily.  90 tablet  0  . Multiple Vitamins-Minerals (PRESERVISION AREDS 2 PO) Take 1 tablet by mouth 2 (two) times daily.      . pantoprazole (PROTONIX) 40 MG tablet Take 40 mg by mouth daily.      .Marland Kitchen  Polyethyl Glycol-Propyl Glycol (SYSTANE) 0.4-0.3 % SOLN Apply 2 drops to eye daily as needed (for dry eyes).      . potassium chloride SA (K-DUR,KLOR-CON) 20 MEQ tablet Take 20 mEq by mouth 2 (two) times daily.      Marland Kitchen tiotropium (SPIRIVA) 18 MCG inhalation capsule Place 18 mcg into inhaler and inhale daily.       . Vitamin D, Ergocalciferol, (DRISDOL) 50000 UNITS CAPS Take 50,000 Units by mouth every 14 (fourteen) days. Every other Sunday      . nitroGLYCERIN (NITROSTAT) 0.4 MG SL tablet Place 0.4 mg under the tongue every 5 (five) minutes as needed for chest pain.      . predniSONE (DELTASONE) 20 MG tablet 3 tabs po day one, then 2 po daily x 4 days  11 tablet  0   Scheduled Meds: . aspirin EC  81 mg Oral Daily  . atorvastatin  20 mg Oral q1800  . benzonatate  100 mg Oral TID  . budesonide-formoterol  2 puff Inhalation BID  . diltiazem  300 mg Oral Daily  . enoxaparin (LOVENOX) injection  40 mg Subcutaneous Q24H    . fluticasone  2 spray Each Nare Daily  . furosemide  40 mg Oral BID  . guaiFENesin  600 mg Oral BID  . ipratropium-albuterol  3 mL Nebulization Q4H WA  . levofloxacin  750 mg Oral Daily  . losartan  50 mg Oral Daily  . metoprolol succinate  25 mg Oral Daily  . pantoprazole  40 mg Oral QAC breakfast  . potassium chloride SA  20 mEq Oral BID  . predniSONE  40 mg Oral Q breakfast  . sodium chloride  3 mL Intravenous Q12H  . sodium chloride  3 mL Intravenous Q12H   Continuous Infusions:  PRN Meds:.sodium chloride, acetaminophen, albuterol, polyvinyl alcohol, sodium chloride, sodium chloride Assessment/Plan: Principal Problem:   COPD (chronic obstructive pulmonary disease) Active Problems:   Hyperlipidemia   Atrial fibrillation   Chronic diastolic CHF (congestive heart failure)   CAD (coronary artery disease)   COPD exacerbation   Bronchitis, chronic obstructive w acute bronchitis  COPD Exacerbation: Patient doing well roughly at home O2 settings (2-3L), cough improved. Ready for discharge home -Incentive Spirometry/Flutter valve - HH PT -Albuterol inhaler 1-2 puff Q6H prn  -Albuterol nebulizer Q6H prn  -Benzonatate 100 mg TID  -Budesonide-Formoterol 2 puffs BID  -Fluticasone 2 sprays daily  -Ipratropium nebulizer Q6H  -Levaquin 750 mg PO daily for 5 days (Start date was 02/01/14) -[place] Nasal Spray prn  -Prednisone 40 mg daily for 5 days (End 02/06/14) -Mucinex 600 mg BID  Chronic Diastolic CHF: appears euvolemic -Daily Weights  -Low Sodium Diet  -ASA 81 mg daily  -Furosemide 40 mg daily  -KCl 20 mEq BID  -Losartan 50 mg daily  -Cardizem 300 mg daily  -Metoprolol 25 mg daily   Paroxysmal Atrial Fibrillation: rate controlled -Telemetry  -Repeat EKG  -ASA 81 mg daily  -Cardizem 300 mg daily  -Metoprolol 25 mg daily   Hypokalemia: resolved -Continue KCl 20 meq BID  -BMET tomorrow am   HTN: mildly hypertenisve -Losartan 50 mg daily  -Cardizem 300 mg  daily -Metoprolol 25 mg daily  Hyperglycemia: secondary to steroids -hemoglobin a1c 5.8  DVT/PE ppx: Lovenox 40 mg daily  Dispo: Discharge home today with [facility] PT  The patient does have a current PCP Andrey Campanile Elizebeth Koller, MD) and does not need an [facility] hospital follow-up appointment after discharge.  The patient does have transportation  limitations that hinder transportation to clinic appointments.  .Services Needed at time of discharge: Y = Yes, Blank = No PT: y  OT:   RN:   Equipment:   Other:     LOS: 3 days   Gust RungErik C Niyana Chesbro, DO  PGY-2 Internal Medicine Resident Pager # [phone]  02/04/2014 11:02 AM

## 2014-02-04 NOTE — Progress Notes (Signed)
Patient ID: [name], female   DOB: [birth year], 80 y.o.   MRN: [id number] Medicine attending discharge note: I personally interviewed and examined this patient on the day of discharge and I concur with the evaluation and discharge plans as outlined by resident physician Dr. Harmon DunEric Hoffman.  80 year old woman with oxygen-dependent obstructive airway disease who presented with acute bronchitis with worsening dyspnea, cough productive of yellow-green sputum but no fever. There were no gross infiltrates or effusions on chest radiograph. She was treated with a course of oral Levaquin 750 mg daily, and prednisone 40 mg daily, in addition to her usual bronchodilators. Oxygen was continued. She had steady improvement with decreased cough and resolution of the sputum production. She'll be discharged in stable condition to followup with her primary care physician. She will complete a 5 day course of Levaquin initially started on October 8. She will continue prednisone through 02/06/2014.  Karen DarbyJames Danyelle Brookover, MD, FACP  Hematology-Oncology/Internal Medicine

## 2014-02-04 NOTE — Progress Notes (Signed)
Pt d/c;d to home with daughter. D/c instructions given to and d/w pt and daughter

## 2014-02-04 NOTE — Discharge Summary (Signed)
Name: [name] MRN: [id number] DOB: [birth year] 80 y.o. PCP: Kaleen Mask, MD  Date of Admission: 02/01/2014  9:29 AM Date of Discharge: 02/04/2014 Attending Physician: Levert Feinstein, MD  Discharge Diagnosis: Principal Problem:   COPD (chronic obstructive pulmonary disease) Active Problems:   Hyperlipidemia   Atrial fibrillation   Chronic diastolic CHF (congestive heart failure)   CAD (coronary artery disease)   COPD exacerbation   Bronchitis, chronic obstructive w acute bronchitis  Discharge Medications:   Medication List         acetaminophen 500 MG tablet  Commonly known as:  TYLENOL  Take 1,000 mg by mouth every 6 (six) hours as needed for moderate pain.     albuterol 108 (90 BASE) MCG/ACT inhaler  Commonly known as:  PROVENTIL HFA;VENTOLIN HFA  Inhale 2 puffs into the lungs every 6 (six) hours as needed for wheezing or shortness of breath.     albuterol (2.5 MG/3ML) 0.083% nebulizer solution  Commonly known as:  PROVENTIL  Take 2.5 mg by nebulization every 6 (six) hours as needed for wheezing.     aspirin EC 81 MG tablet  Take 81 mg by mouth daily.     atorvastatin 40 MG tablet  Commonly known as:  LIPITOR  Take 0.5 tablets (20 mg total) by mouth daily at 6 PM.     benzonatate 100 MG capsule  Commonly known as:  TESSALON  Take 1 capsule (100 mg total) by mouth 3 (three) times daily.     budesonide-formoterol 160-4.5 MCG/ACT inhaler  Commonly known as:  SYMBICORT  Inhale 2 puffs into the lungs 2 (two) times daily.     diltiazem 300 MG 24 hr capsule  Commonly known as:  CARDIZEM CD  Take 1 capsule (300 mg total) by mouth daily.     furosemide 40 MG tablet  Commonly known as:  LASIX  Take 1 tablet (40 mg total) by mouth 2 (two) times daily.     guaiFENesin 600 MG 12 hr tablet  Commonly known as:  MUCINEX  Take 1 tablet (600 mg total) by mouth 2 (two) times daily.     IPRATROPIUM BROMIDE NA  Place 2 sprays into the nose daily as  needed (for stuffiness/wheezing).     losartan 50 MG tablet  Commonly known as:  COZAAR  Take 50 mg by mouth daily.     metoprolol succinate 25 MG 24 hr tablet  Commonly known as:  TOPROL-XL  Take 1 tablet (25 mg total) by mouth daily.     nitroGLYCERIN 0.4 MG SL tablet  Commonly known as:  NITROSTAT  Place 0.4 mg under the tongue every 5 (five) minutes as needed for chest pain.     pantoprazole 40 MG tablet  Commonly known as:  PROTONIX  Take 40 mg by mouth daily.     potassium chloride SA 20 MEQ tablet  Commonly known as:  K-DUR,KLOR-CON  Take 20 mEq by mouth 2 (two) times daily.     predniSONE 20 MG tablet  Commonly known as:  DELTASONE  Take 2 tablets (40 mg total) by mouth daily with breakfast.  Start taking on:  02/05/2014     PRESERVISION AREDS 2 PO  Take 1 tablet by mouth 2 (two) times daily.     SYSTANE 0.4-0.3 % Soln  Generic drug:  Polyethyl Glycol-Propyl Glycol  Apply 2 drops to eye daily as needed (for dry eyes).     tiotropium 18 MCG inhalation capsule  Commonly  known as:  SPIRIVA  Place 18 mcg into inhaler and inhale daily.     Vitamin D (Ergocalciferol) 50000 UNITS Caps capsule  Commonly known as:  DRISDOL  Take 50,000 Units by mouth every 14 (fourteen) days. Every other Sunday        Disposition and follow-up:   Ms.[name] was discharged from [facility] in Stable condition.  At the hospital follow up visit please address:  1.  Resolution of COPD exacerbation  2.  Labs / imaging needed at time of follow-up: None  3.  Pending labs/ test needing follow-up: None  Follow-up Appointments: Follow-up Information   Follow up with Kaleen Mask, MD In 2 weeks. (for hospital follow up)    Specialty:  Family Medicine   Contact information:   [address] [place] [address] [phone]       Discharge Instructions: Discharge Instructions   Call MD for:  difficulty breathing, headache or visual  disturbances    Complete by:  As directed      Call MD for:  temperature >100.4    Complete by:  As directed      Diet - low sodium heart healthy    Complete by:  As directed      Discharge instructions    Complete by:  As directed   Please continue you home medications.  Take 40mg  of Prednisone on Monday and Tuesday.     Increase activity slowly    Complete by:  As directed            Consultations:    Procedures Performed:  Dg Chest 2 View  02/01/2014   CLINICAL DATA:  Weakness, shortness of breath, dizziness, cough with left lower chest pain; all symptoms intermittent for the past few weeks; history of COPD, CHF, and aortic valve disease  EXAM: CHEST  2 VIEW  COMPARISON:  Portable chest x-ray of February 01, 2014  FINDINGS: The lungs are hyperinflated with hemidiaphragm flattening. There are small amounts of pleural fluid blunting the posterior costophrenic angles. The cardiac silhouette is top-normal in size. The pulmonary vascularity is engorged. The interstitial markings are increased diffusely. The patient has undergone previous CABG. The bony thorax exhibits no acute abnormality.  IMPRESSION: COPD with interstitial edema likely of cardiac cause. One cannot exclude superimposed acute bronchitis in the appropriate clinical setting.   Electronically Signed   By: David  [place]   On: 02/01/2014 12:02   Dg Chest Portable 1 View  02/01/2014   CLINICAL DATA:  80 year old female presenting with recent history of shortness of breath, cough and congestion. History of COPD.  EXAM: PORTABLE CHEST - 1 VIEW  COMPARISON:  Chest x-ray 11/14/2013.  FINDINGS: Compared to prior examinations there is worsening peribronchial cuffing and diffuse interstitial prominence. No confluent consolidative airspace disease. No definite pleural effusions. Heart size is mildly enlarged. Mild cephalization of the pulmonary vasculature. Upper mediastinal contours are distorted by patient positioning, but appear within normal  limits. Atherosclerosis in the thoracic aorta. Status post median sternotomy for CABG.  IMPRESSION: 1. Increasing diffuse peribronchial cuffing and interstitial prominence. This could suggest acute bronchitis, however, given the cardiomegaly and cephalization of the pulmonary vasculature, these findings could alternatively be explained by mild congestive heart failure. Clinical correlation is recommended. 2. Atherosclerosis.   Electronically Signed   By: Trudie Reed M.D.   On: 02/01/2014 09:52   Admission HPI: Ms. Elman is a 80 yo female with PMHx of COPD, diastolic CHF with  EF 55-60% (NYHA class 2), paroxysmal atrial fibrillation with RVR (not coumadin candidate due to hx of GIB with AVMs), Carotid Artery Disease (1-39% R ICA, 40-59% L ICA as of 01/2010), HTN and HLD who presents to the ED via EMS for complaint of shortness of breath. Patient states she has had increasing shortness of breath over the past several days, but much worse this morning. She awoke this morning and was so short of breath that she could not get out of bed to reach her nebulizer. She called EMS who found her in a tripod position in bed. Patient has had nasal congestion and a productive cough of brown and green sputum for about one week. She admits that her grandson who lives with her has been sick along with her son and daughter in law. She has had increasing shortness of breath on exertion and ambulation. She is chronically on home oxygen usually 2 L via [place], but up to 3 L if she needs it. She has not taken any of her medications today, but has not missed any other doses other than today. She does admit that she has some increased swelling in her legs, even though they stay chronically swollen. Her normal weight fluctuates between 184-189. She admits to heart palpitations but denies any chest pain, nausea, vomiting, abdominal pain or diarrhea.   Hospital Course by problem list: COPD Exacerbation: Patient presented with complain  of shortness of breath, productive cough, and nasal congestion worsening over the past several days. Vital signs on admission showed the patient was afebrile, tachycardic, saturating between 90-100% on 2 L via [place], RR 19. Patient has no leukocytosis. 2 view CXR showed COPD with interstitial edema likely of cardiac cause, but superimposed acute bronchitis cannot be excluded. EMS gave 125 mg of solumedrol and gave 10/0.5.  Patient's COPD exacerbation is likely secondary to bronchitis versus URI.  We treated her for a COPD exacerbation with steroids, Duonebs, and Levaquin for 5 days  She improved to her baseline respiratory status and was discharged home to continue treatment on her home 2L of supplemental oxygen.  Chronic Diastolic CHF: her CXR suggested some possible mild edema however overall her dyspnea was attributed to a COPD exacerbation.  She was continued on her home medications and home doses of diuretics and treated for a COPD exacerbation.  Paroxysmal Atrial Fibrillation: rate controlled, continued on home medications.  Hypokalemia: Mildly hypokalemia on admission, replaced  HTN: Patient was continued on her home medications:Losartan 50 mg daily, Cardizem 300 mg daily, Metoprolol 25 mg daily   Hyperglycemia: secondary to steroids, hemoglobin A1c was checked and was  5.8%    Discharge Vitals:   BP 140/72  Pulse 80  Temp(Src) 97.5 F (36.4 C) (Oral)  Resp 18  Ht 5\' 11"  (1.803 m)  Wt 196 lb 3.4 oz (89 kg)  BMI 27.38 kg/m2  SpO2 100%  Discharge Labs:  Results for orders placed during the hospital encounter of 02/01/14 (from the past 24 hour(s))  BASIC METABOLIC PANEL     Status: Abnormal   Collection Time    02/04/14  3:44 AM      Result Value Ref Range   Sodium 138  137 - 147 mEq/L   Potassium 3.9  3.7 - 5.3 mEq/L   Chloride 97  96 - 112 mEq/L   CO2 31  19 - 32 mEq/L   Glucose, Bld 184 (*) 70 - 99 mg/dL   BUN 31 (*) 6 - 23 mg/dL   Creatinine,  Ser 1.21 (*) 0.50 - 1.10 mg/dL    Calcium 9.5  8.4 - 64.410.5 mg/dL   GFR calc non Af Amer 41 (*) >90 mL/min   GFR calc Af Amer 47 (*) >90 mL/min   Anion gap 10  5 - 15    Signed: Gust RungErik C Rino Hosea, DO 02/04/2014, 11:24 AM    Services Ordered on Discharge: HH PT Equipment Ordered on Discharge: None

## 2014-02-04 NOTE — Discharge Instructions (Addendum)
Please resume you home medications.  I also want you to take 40mg  of prednisone (2 pills) on Monday and Tuesday.    Please take Benzoate caps and Mucinex if you are still coughing.

## 2014-02-09 NOTE — Discharge Summary (Signed)
Attending Medicine I personally examined this patient on the day of discharge and concur with the evaluation and discharge plan as recorded by resident physician Dr Harmon DunEric Hoffman.  Cephas DarbyJames Granfortuna, MD, FACP  Hematology-Oncology/Internal Medicine

## 2014-03-06 ENCOUNTER — Ambulatory Visit (INDEPENDENT_AMBULATORY_CARE_PROVIDER_SITE_OTHER): Payer: Medicare Other | Admitting: Pulmonary Disease

## 2014-03-06 ENCOUNTER — Ambulatory Visit: Payer: Medicare Other | Admitting: *Deleted

## 2014-03-06 ENCOUNTER — Encounter: Payer: Self-pay | Admitting: Pulmonary Disease

## 2014-03-06 VITALS — BP 110/78 | HR 81 | Temp 98.1°F

## 2014-03-06 DIAGNOSIS — J441 Chronic obstructive pulmonary disease with (acute) exacerbation: Secondary | ICD-10-CM

## 2014-03-06 DIAGNOSIS — Z23 Encounter for immunization: Secondary | ICD-10-CM

## 2014-03-06 DIAGNOSIS — J9611 Chronic respiratory failure with hypoxia: Secondary | ICD-10-CM

## 2014-03-06 MED ORDER — BUDESONIDE-FORMOTEROL FUMARATE 160-4.5 MCG/ACT IN AERO: 2.0000 | INHALATION_SPRAY | Freq: Two times a day (BID) | RESPIRATORY_TRACT | Status: DC

## 2014-03-06 MED ORDER — TIOTROPIUM BROMIDE MONOHYDRATE 18 MCG IN CAPS: 18.0000 ug | ORAL_CAPSULE | Freq: Every day | RESPIRATORY_TRACT | Status: DC

## 2014-03-06 MED ORDER — ALBUTEROL SULFATE HFA 108 (90 BASE) MCG/ACT IN AERS: 2.0000 | INHALATION_SPRAY | Freq: Four times a day (QID) | RESPIRATORY_TRACT | Status: DC | PRN

## 2014-03-06 NOTE — Progress Notes (Signed)
   Subjective:    Patient ID: [name], female    DOB: Mar 20, 1930, 80 y.o.   MRN: [id number]  HPI   [name] , smoker presents for FU of gold D COPD, 2-3 admits/ yr.  She has been on home O2 since dec 2010 ([employer]). She smoked 1/2 PPD, about 5540 Pyrs - chantix made her sick & she is afraid of cardiac side effects with patches. She sees Dr Myrtis SerKatz for CAD & has tolerated lisinopril & metoprolol, echo 7/10 showed nml LV fn & non dilated RV. An episode of syncope in 2009 was attributed to NTG & diuretics.   Significant tests/ events  PFTs 08/19/09 >> severe airway obstruction, FEV1 47%, no BD response, air trapping +, severe decrease in diffusion  CT angio 5/12 asc aortic aneurysm 41mm, hepatic cysts  Hosp 06/2011 for New paroxysmal atrial fibrillation with RVR and COPD exacerbation  - not felt to be a coumadin candidate secondary to hx of GIB/AVMs   Hosp adm 07/2012 for stent, 09/2012 for LLE cellulitis & bleeding, ABI (demonstarted moderate Left lower extremity PAD)  LE dopplers (no DVT)   Admitted 01/2014 for AECOPD   03/06/2014  Chief Complaint  Patient presents with  . Follow-up    f/u COPD; deep cough; chest congestion;chest tightness   Accompanied by sister Dyspnea at baseline, walks around the house, no sputum production Remains on symbicort and spiriva .  No chest pain, pedal edema Compliant with o2 Son mark lives closeby    Review of Systems neg for any significant sore throat, dysphagia, itching, sneezing, nasal congestion or excess/ purulent secretions, fever, chills, sweats, unintended wt loss, pleuritic or exertional cp, hempoptysis, orthopnea pnd or change in chronic leg swelling. Also denies presyncope, palpitations, heartburn, abdominal pain, nausea, vomiting, diarrhea or change in bowel or urinary habits, dysuria,hematuria, rash, arthralgias, visual complaints, headache, numbness weakness or ataxia.      Objective:   Physical Exam  Gen. Pleasant,  well-nourished, chronically ill,in no distress ENT - no lesions, no post nasal drip Neck: No JVD, no thyromegaly, no carotid bruits Lungs: no use of accessory muscles, no dullness to percussion, decreased without rales or rhonchi  Cardiovascular: Rhythm regular, heart sounds  normal, no murmurs or gallops, no peripheral edema Musculoskeletal: No deformities, no cyanosis or clubbing        Assessment & Plan:

## 2014-03-06 NOTE — Assessment & Plan Note (Signed)
Refills on symbicort & spiriva Increase lasix back up to 40 mg twice daily until swelling resolved, then once/daily Prevnar Flu shot Call if breathing worse

## 2014-03-06 NOTE — Assessment & Plan Note (Signed)
Discussed her understanding of lung disease - she would not want t be a burden on anyone - I take this to mean , no permanent tubes - she has discussed with her son mark

## 2014-03-06 NOTE — Patient Instructions (Signed)
Refills on symbicort & spiriva Increase lasix back up to 40 mg twice daily until swelling resolved, then once/daily Prevnar Flu shot Call if breathing worse 

## 2014-03-26 ENCOUNTER — Encounter: Payer: Self-pay | Admitting: Cardiology

## 2014-03-26 ENCOUNTER — Ambulatory Visit (INDEPENDENT_AMBULATORY_CARE_PROVIDER_SITE_OTHER): Payer: Medicare Other | Admitting: Cardiology

## 2014-03-26 VITALS — BP 150/70 | HR 73 | Ht 71.0 in | Wt 186.0 lb

## 2014-03-26 DIAGNOSIS — I25799 Atherosclerosis of other coronary artery bypass graft(s) with unspecified angina pectoris: Secondary | ICD-10-CM

## 2014-03-26 DIAGNOSIS — I10 Essential (primary) hypertension: Secondary | ICD-10-CM

## 2014-03-26 DIAGNOSIS — I482 Chronic atrial fibrillation, unspecified: Secondary | ICD-10-CM

## 2014-03-26 DIAGNOSIS — J9611 Chronic respiratory failure with hypoxia: Secondary | ICD-10-CM

## 2014-03-26 DIAGNOSIS — I5032 Chronic diastolic (congestive) heart failure: Secondary | ICD-10-CM

## 2014-03-26 NOTE — Assessment & Plan Note (Signed)
She has severe chronic respiratory disease. Her other physicians will be working with this.

## 2014-03-26 NOTE — Patient Instructions (Signed)
**Note De-identified  Obfuscation** Your physician recommends that you continue on your current medications as directed. Please refer to the Current Medication list given to you today.  Your physician wants you to follow-up in: 6 months. You will receive a reminder letter in the mail two months in advance. If you don't receive a letter, please call our office to schedule the follow-up appointment.  

## 2014-03-26 NOTE — Progress Notes (Signed)
Patient ID: [name], female   DOB: Aug 23, 1929, 80 y.o.   MRN: [id number]    HPI Patient is seen today to follow-up severe lung disease and coronary disease. I saw her last, 74. She was hospitalized in October, 2015. This was a hospitalization for COPD exacerbation. There was no significant heart failure during that admission. Her most recent echo in the last year showed good LV function. She is post CABG in the past. She has ongoing significant ischemic disease. However she's not had chest pain. I do believe the majority of her problems at this time or pulmonary in origin.  Allergies  Allergen Reactions  . Other Other (See Comments)    TETANUS-can only take 1/2 dose at one time, can take the other 1/2 dose about 3 days later  . Codeine Nausea And Vomiting  . Sulfonamide Derivatives Nausea And Vomiting  . Warfarin     H/o AVM's prechief complaintcluding anticoagulation  . Penicillins Itching, Rash and Other (See Comments)    Nasal itching   . Ranitidine Hcl Itching and Rash    Current Outpatient Prescriptions  Medication Sig Dispense Refill  . acetaminophen (TYLENOL) 500 MG tablet Take 1,000 mg by mouth every 6 (six) hours as needed for moderate pain.    Marland Kitchen albuterol (PROVENTIL HFA;VENTOLIN HFA) 108 (90 BASE) MCG/ACT inhaler Inhale 2 puffs into the lungs every 6 (six) hours as needed for wheezing or shortness of breath. 1 Inhaler 0  . albuterol (PROVENTIL) (2.5 MG/3ML) 0.083% nebulizer solution Take 2.5 mg by nebulization every 6 (six) hours as needed for wheezing.    Marland Kitchen aspirin EC 81 MG tablet Take 81 mg by mouth daily.    Marland Kitchen atorvastatin (LIPITOR) 40 MG tablet Take 0.5 tablets (20 mg total) by mouth daily at 6 PM. 90 tablet 1  . budesonide-formoterol (SYMBICORT) 160-4.5 MCG/ACT inhaler Inhale 2 puffs into the lungs 2 (two) times daily. 3 Inhaler 0  . diltiazem (CARDIZEM CD) 300 MG 24 hr capsule Take 1 capsule (300 mg total) by mouth daily. 30 capsule 0  . furosemide (LASIX) 40 MG  tablet Take 1 tablet (40 mg total) by mouth 2 (two) times daily. 180 tablet 1  . guaiFENesin (MUCINEX) 600 MG 12 hr tablet Take 1 tablet (600 mg total) by mouth 2 (two) times daily.    . IPRATROPIUM BROMIDE NA Place 2 sprays into the nose daily as needed (for stuffiness/wheezing).     Marland Kitchen losartan (COZAAR) 50 MG tablet Take 50 mg by mouth daily.    . metoprolol succinate (TOPROL-XL) 25 MG 24 hr tablet Take 1 tablet (25 mg total) by mouth daily. 90 tablet 0  . Multiple Vitamins-Minerals (PRESERVISION AREDS 2 PO) Take 1 tablet by mouth 2 (two) times daily.    . nitroGLYCERIN (NITROSTAT) 0.4 MG SL tablet Place 0.4 mg under the tongue every 5 (five) minutes as needed for chest pain.    . pantoprazole (PROTONIX) 40 MG tablet Take 40 mg by mouth daily.    Bertram Gala Glycol-Propyl Glycol (SYSTANE) 0.4-0.3 % SOLN Apply 2 drops to eye daily as needed (for dry eyes).    . potassium chloride SA (K-DUR,KLOR-CON) 20 MEQ tablet Take 20 mEq by mouth 2 (two) times daily.    Marland Kitchen tiotropium (SPIRIVA) 18 MCG inhalation capsule Place 1 capsule (18 mcg total) into inhaler and inhale daily. 90 capsule 0  . Vitamin D, Ergocalciferol, (DRISDOL) 50000 UNITS CAPS Take 50,000 Units by mouth every 14 (fourteen) days. Every other Sunday  No current facility-administered medications for this visit.    History   Social History  . Marital Status: Widowed    Spouse Name: N/A    Number of Children: 1  . Years of Education: N/A   Occupational History  . retired     [employer]   Social History Main Topics  . Smoking status: Former Smoker -- 0.50 packs/day for 59 years    Types: Cigarettes    Quit date: 02/01/2013  . Smokeless tobacco: Not on file  . Alcohol Use: No  . Drug Use: Not on file  . Sexual Activity: Not on file   Other Topics Concern  . Not on file   Social History Narrative    Family History  Problem Relation Age of Onset  . Stroke Sister   . Heart failure Mother   . Cancer Sister     Breast and  Lung   . Cancer Brother     breast cancer    Past Medical History  Diagnosis Date  . CAD (coronary artery disease)     a. s/p CABG x5 (2005) b. s/p BMS-prox SVG-D2 (2014)  . Hypertension   . Hyperlipidemia   . GI bleed     AVMs  . Aneurysm, aortic     thoracic aorta, stable at 4.1 cm, chest CT, May, 2012  . Syncope     Nitroglycerin plus a diuretic, April, 2009  . Hyponatremia     Chronic. Felt secondary to SIADH   . COPD (chronic obstructive pulmonary disease)   . Tobacco abuse   . Bradycardia   . Carotid artery disease     Hx of endarterectomy. Doppler October, 2011, stable, 0-39% RIC A., 40-59% LICA  . Tuberculosis     Exposures to tuberculosis 1970s, has tested negative by the health Department  . Atrial fibrillation     Paroxysmal with RVR 07/2011, not felt to be a coumadin candidate secondary to hx of GIB/AVM  . Venous stasis of lower extremity     Chronic  . Ejection fraction     a. EF 55-60%, echo, April, 2013 b. EF 55-60%, mild biatrial enlargement and PASP 42 mmH  . CHF with left ventricular diastolic dysfunction, NYHA class 2   . AAA (abdominal aortic aneurysm)   . On home O2     2L N/C  . Complication of anesthesia   . Shortness of breath   . Hx of CABG     CABG 2005  . Hematoma     Right groin hematoma, small, post cath, April, 2014  . Mitral regurgitation     Mild, hospital, March, 2014  . Leg ulcer     Ulcerated lesion on the anterior aspect of her left lower leg, June, 2014    Past Surgical History  Procedure Laterality Date  . Cholecystectomy    . Carotid endartercetomy    . Abdominal hysterectomy    . Coronary artery bypass graft  2005  . Coronary angioplasty with stent placement  07/18/12    severe native coronary artery disease, 99% proximal stenosis of the SVG-D2 status post successful PTCA/PCI with a Veriflex bare-metal stent, widely patent SVGs to both the right PDA sequential OM1 to OM 2, EF 65-70% and 3+ mitral regurgitation    Patient  Active Problem List   Diagnosis Date Noted  . Bronchitis, chronic obstructive w acute bronchitis 02/02/2014  . COPD exacerbation 02/01/2014  . Dyspnea 11/15/2013  . Atrial fibrillation with tachycardic ventricular rate 11/15/2013  .  Peripheral arterial disease 10/17/2013  . Acute respiratory failure 02/09/2013  . Atypical chest pain 02/08/2013  . Cellulitis, leg 09/28/2012  . Leg ulcer   . CAD (coronary artery disease)   . Hematoma   . Mitral regurgitation   . Chronic diastolic CHF (congestive heart failure) 07/20/2012  . Anemia 07/20/2012  . Intermediate coronary syndrome 07/18/2012  . Acute exacerbation of chronic obstructive pulmonary disease (COPD) 02/15/2012  . Dehydration 02/12/2012  . Alkalosis, metabolic 02/12/2012  . Generalized weakness 02/11/2012  . Atrial fibrillation   . Chronic respiratory failure 07/29/2011  . H/O steroid therapy 07/28/2011  . Edema   . Aortic valve sclerosis   . Hypertension   . Hyperlipidemia   . GI bleed   . Aneurysm, aortic   . Syncope   . Hyponatremia   . COPD (chronic obstructive pulmonary disease)   . Tobacco abuse   . Bradycardia   . Hx of CABG   . Ejection fraction   . Carotid artery disease   . Tuberculosis   . OXYGEN-USE OF SUPPLEMENTAL 08/02/2009    ROS  Patient is oriented to person time and place. Affect is normal. She's wearing her home O2 and she is short of breath with this. She did walk in with a rolling walker. Head is atraumatic. Sclera and conjunctiva are normal. Lungs revealed decreased breath sounds. She is short of breath from having walked in to the examining area. Cardiac exam reveals S1 and S2. Her abdomen is soft. There is no significant peripheral edema today.  PHYSICAL EXAM  Filed Vitals:   03/26/14 1425  BP: 150/70  Pulse: 73  Height: 5\' 11"  (1.803 m)  Weight: 186 lb (84.369 kg)  SpO2: 91%     ASSESSMENT & PLAN

## 2014-03-26 NOTE — Assessment & Plan Note (Signed)
Patient has atrial fibrillation. She is on meds for rate control. With her severe lung disease it is very difficult to completely control her heart rate. No change in therapy.

## 2014-03-26 NOTE — Assessment & Plan Note (Signed)
Her volume status is stable. No change in therapy. 

## 2014-03-26 NOTE — Assessment & Plan Note (Signed)
Blood pressures controlled. No change in therapy. 

## 2014-03-26 NOTE — Assessment & Plan Note (Signed)
She did have some chest discomfort recently. This does not occur on a frequent basis. I've asked her to keep her nitroglycerin readily available.

## 2014-04-05 ENCOUNTER — Encounter (HOSPITAL_COMMUNITY): Payer: Self-pay | Admitting: Cardiology

## 2014-05-04 ENCOUNTER — Encounter (HOSPITAL_COMMUNITY): Payer: Self-pay | Admitting: Emergency Medicine

## 2014-05-04 ENCOUNTER — Inpatient Hospital Stay (HOSPITAL_COMMUNITY)
Admission: EM | Admit: 2014-05-04 | Discharge: 2014-05-08 | DRG: 871 | Disposition: A | Discharge status: Skilled Nursing Facility | Payer: Medicare Other | Attending: Internal Medicine | Admitting: Internal Medicine

## 2014-05-04 ENCOUNTER — Inpatient Hospital Stay (HOSPITAL_COMMUNITY): Payer: Medicare Other

## 2014-05-04 ENCOUNTER — Emergency Department (HOSPITAL_COMMUNITY): Payer: Medicare Other

## 2014-05-04 DIAGNOSIS — I340 Nonrheumatic mitral (valve) insufficiency: Secondary | ICD-10-CM | POA: Diagnosis present

## 2014-05-04 DIAGNOSIS — E876 Hypokalemia: Secondary | ICD-10-CM | POA: Diagnosis present

## 2014-05-04 DIAGNOSIS — Z955 Presence of coronary angioplasty implant and graft: Secondary | ICD-10-CM | POA: Diagnosis not present

## 2014-05-04 DIAGNOSIS — Z951 Presence of aortocoronary bypass graft: Secondary | ICD-10-CM | POA: Diagnosis not present

## 2014-05-04 DIAGNOSIS — A419 Sepsis, unspecified organism: Secondary | ICD-10-CM | POA: Diagnosis not present

## 2014-05-04 DIAGNOSIS — J189 Pneumonia, unspecified organism: Secondary | ICD-10-CM | POA: Diagnosis present

## 2014-05-04 DIAGNOSIS — R0602 Shortness of breath: Secondary | ICD-10-CM | POA: Diagnosis not present

## 2014-05-04 DIAGNOSIS — I10 Essential (primary) hypertension: Secondary | ICD-10-CM | POA: Diagnosis present

## 2014-05-04 DIAGNOSIS — I2510 Atherosclerotic heart disease of native coronary artery without angina pectoris: Secondary | ICD-10-CM | POA: Diagnosis present

## 2014-05-04 DIAGNOSIS — J449 Chronic obstructive pulmonary disease, unspecified: Secondary | ICD-10-CM | POA: Diagnosis present

## 2014-05-04 DIAGNOSIS — I714 Abdominal aortic aneurysm, without rupture: Secondary | ICD-10-CM | POA: Diagnosis present

## 2014-05-04 DIAGNOSIS — R002 Palpitations: Secondary | ICD-10-CM

## 2014-05-04 DIAGNOSIS — I482 Chronic atrial fibrillation: Secondary | ICD-10-CM | POA: Diagnosis present

## 2014-05-04 DIAGNOSIS — N390 Urinary tract infection, site not specified: Secondary | ICD-10-CM | POA: Diagnosis present

## 2014-05-04 DIAGNOSIS — E785 Hyperlipidemia, unspecified: Secondary | ICD-10-CM | POA: Diagnosis present

## 2014-05-04 DIAGNOSIS — J9610 Chronic respiratory failure, unspecified whether with hypoxia or hypercapnia: Secondary | ICD-10-CM | POA: Diagnosis present

## 2014-05-04 DIAGNOSIS — I5032 Chronic diastolic (congestive) heart failure: Secondary | ICD-10-CM | POA: Diagnosis present

## 2014-05-04 DIAGNOSIS — I4891 Unspecified atrial fibrillation: Secondary | ICD-10-CM | POA: Diagnosis present

## 2014-05-04 DIAGNOSIS — E222 Syndrome of inappropriate secretion of antidiuretic hormone: Secondary | ICD-10-CM | POA: Diagnosis present

## 2014-05-04 DIAGNOSIS — R0600 Dyspnea, unspecified: Secondary | ICD-10-CM

## 2014-05-04 DIAGNOSIS — I248 Other forms of acute ischemic heart disease: Secondary | ICD-10-CM | POA: Diagnosis present

## 2014-05-04 DIAGNOSIS — N12 Tubulo-interstitial nephritis, not specified as acute or chronic: Secondary | ICD-10-CM | POA: Insufficient documentation

## 2014-05-04 DIAGNOSIS — D649 Anemia, unspecified: Secondary | ICD-10-CM | POA: Diagnosis present

## 2014-05-04 DIAGNOSIS — Z7982 Long term (current) use of aspirin: Secondary | ICD-10-CM

## 2014-05-04 DIAGNOSIS — Z87891 Personal history of nicotine dependence: Secondary | ICD-10-CM

## 2014-05-04 LAB — CBC WITH DIFFERENTIAL/PLATELET
Basophils Absolute: 0 10*3/uL (ref 0.0–0.1)
Basophils Absolute: 0 10*3/uL (ref 0.0–0.1)
Basophils Relative: 0 % (ref 0–1)
Basophils Relative: 0 % (ref 0–1)
EOS ABS: 0 10*3/uL (ref 0.0–0.7)
Eosinophils Absolute: 0 10*3/uL (ref 0.0–0.7)
Eosinophils Relative: 0 % (ref 0–5)
Eosinophils Relative: 0 % (ref 0–5)
HCT: 38.9 % (ref 36.0–46.0)
HEMATOCRIT: 33.1 % — AB (ref 36.0–46.0)
HEMOGLOBIN: 10.6 g/dL — AB (ref 12.0–15.0)
Hemoglobin: 12.7 g/dL (ref 12.0–15.0)
LYMPHS ABS: 0.7 10*3/uL (ref 0.7–4.0)
Lymphocytes Relative: 2 % — ABNORMAL LOW (ref 12–46)
Lymphocytes Relative: 3 % — ABNORMAL LOW (ref 12–46)
Lymphs Abs: 0.6 10*3/uL — ABNORMAL LOW (ref 0.7–4.0)
MCH: 28.2 pg (ref 26.0–34.0)
MCH: 28.1 pg (ref 26.0–34.0)
MCHC: 32.6 g/dL (ref 30.0–36.0)
MCHC: 32 g/dL (ref 30.0–36.0)
MCV: 86.4 fL (ref 78.0–100.0)
MCV: 87.8 fL (ref 78.0–100.0)
Monocytes Absolute: 1.3 10*3/uL — ABNORMAL HIGH (ref 0.1–1.0)
Monocytes Absolute: 1.1 10*3/uL — ABNORMAL HIGH (ref 0.1–1.0)
Monocytes Relative: 5 % (ref 3–12)
Monocytes Relative: 5 % (ref 3–12)
Neutro Abs: 22.3 10*3/uL — ABNORMAL HIGH (ref 1.7–7.7)
Neutro Abs: 20.3 10*3/uL — ABNORMAL HIGH (ref 1.7–7.7)
Neutrophils Relative %: 93 % — ABNORMAL HIGH (ref 43–77)
Neutrophils Relative %: 92 % — ABNORMAL HIGH (ref 43–77)
PLATELETS: 221 10*3/uL (ref 150–400)
Platelets: 274 10*3/uL (ref 150–400)
RBC: 4.5 MIL/uL (ref 3.87–5.11)
RBC: 3.77 MIL/uL — AB (ref 3.87–5.11)
RDW: 13.9 % (ref 11.5–15.5)
RDW: 14.3 % (ref 11.5–15.5)
WBC: 24.2 10*3/uL — AB (ref 4.0–10.5)
WBC: 22.1 10*3/uL — AB (ref 4.0–10.5)

## 2014-05-04 LAB — URINE MICROSCOPIC-ADD ON

## 2014-05-04 LAB — COMPREHENSIVE METABOLIC PANEL
ALBUMIN: 3.4 g/dL — AB (ref 3.5–5.2)
ALK PHOS: 68 U/L (ref 39–117)
ALT: 8 U/L (ref 0–35)
ALT: 7 U/L (ref 0–35)
AST: 15 U/L (ref 0–37)
AST: 13 U/L (ref 0–37)
Albumin: 2.6 g/dL — ABNORMAL LOW (ref 3.5–5.2)
Alkaline Phosphatase: 62 U/L (ref 39–117)
Anion gap: 11 (ref 5–15)
Anion gap: 6 (ref 5–15)
BUN: 15 mg/dL (ref 6–23)
BUN: 14 mg/dL (ref 6–23)
CHLORIDE: 95 meq/L — AB (ref 96–112)
CO2: 33 mmol/L — AB (ref 19–32)
CO2: 32 mmol/L (ref 19–32)
CREATININE: 1.08 mg/dL (ref 0.50–1.10)
Calcium: 10.3 mg/dL (ref 8.4–10.5)
Calcium: 9.1 mg/dL (ref 8.4–10.5)
Chloride: 90 mEq/L — ABNORMAL LOW (ref 96–112)
Creatinine, Ser: 0.95 mg/dL (ref 0.50–1.10)
GFR calc Af Amer: 54 mL/min — ABNORMAL LOW (ref >90)
GFR calc Af Amer: 63 mL/min — ABNORMAL LOW (ref >90)
GFR calc non Af Amer: 54 mL/min — ABNORMAL LOW (ref >90)
GFR, EST NON AFRICAN AMERICAN: 46 mL/min — AB (ref >90)
Glucose, Bld: 187 mg/dL — ABNORMAL HIGH (ref 70–99)
Glucose, Bld: 188 mg/dL — ABNORMAL HIGH (ref 70–99)
POTASSIUM: 3.4 mmol/L — AB (ref 3.5–5.1)
Potassium: 3.1 mmol/L — ABNORMAL LOW (ref 3.5–5.1)
SODIUM: 133 mmol/L — AB (ref 135–145)
Sodium: 134 mmol/L — ABNORMAL LOW (ref 135–145)
TOTAL PROTEIN: 6.7 g/dL (ref 6.0–8.3)
Total Bilirubin: 1.3 mg/dL — ABNORMAL HIGH (ref 0.3–1.2)
Total Bilirubin: 1.1 mg/dL (ref 0.3–1.2)
Total Protein: 5.3 g/dL — ABNORMAL LOW (ref 6.0–8.3)

## 2014-05-04 LAB — URINALYSIS, ROUTINE W REFLEX MICROSCOPIC
Glucose, UA: NEGATIVE mg/dL
Ketones, ur: 15 mg/dL — AB
NITRITE: POSITIVE — AB
Protein, ur: 30 mg/dL — AB
Specific Gravity, Urine: 1.025 (ref 1.005–1.030)
Urobilinogen, UA: 1 mg/dL (ref 0.0–1.0)
pH: 6 (ref 5.0–8.0)

## 2014-05-04 LAB — STREP PNEUMONIAE URINARY ANTIGEN: Strep Pneumo Urinary Antigen: NEGATIVE

## 2014-05-04 LAB — I-STAT TROPONIN, ED: Troponin i, poc: 0.01 ng/mL (ref 0.00–0.08)

## 2014-05-04 LAB — INFLUENZA PANEL BY PCR (TYPE A & B)
H1N1 flu by pcr: NOT DETECTED
Influenza A By PCR: NEGATIVE
Influenza B By PCR: NEGATIVE

## 2014-05-04 LAB — I-STAT CG4 LACTIC ACID, ED: Lactic Acid, Venous: 1.78 mmol/L (ref 0.5–2.2)

## 2014-05-04 LAB — TROPONIN I: Troponin I: 0.04 ng/mL — ABNORMAL HIGH (ref <0.031)

## 2014-05-04 LAB — T4, FREE: FREE T4: 1.08 ng/dL (ref 0.80–1.80)

## 2014-05-04 LAB — TSH: TSH: 0.892 u[IU]/mL (ref 0.350–4.500)

## 2014-05-04 MED ORDER — LEVALBUTEROL HCL 0.63 MG/3ML IN NEBU
0.6300 mg | INHALATION_SOLUTION | Freq: Four times a day (QID) | RESPIRATORY_TRACT | Status: DC | PRN
  Filled 2014-05-04: qty 3

## 2014-05-04 MED ORDER — LOSARTAN POTASSIUM 50 MG PO TABS
50.0000 mg | ORAL_TABLET | Freq: Every day | ORAL | Status: DC
  Administered 2014-05-04: 50 mg via ORAL
  Filled 2014-05-04 (×2): qty 1

## 2014-05-04 MED ORDER — SODIUM CHLORIDE 0.9 % IV SOLN
INTRAVENOUS | Status: DC
Start: 2014-05-04 — End: 2014-05-04
  Administered 2014-05-04: 10:00:00 via INTRAVENOUS

## 2014-05-04 MED ORDER — LEVALBUTEROL HCL 0.63 MG/3ML IN NEBU
0.6300 mg | INHALATION_SOLUTION | Freq: Four times a day (QID) | RESPIRATORY_TRACT | Status: DC
  Administered 2014-05-04 – 2014-05-08 (×15): 0.63 mg via RESPIRATORY_TRACT
  Filled 2014-05-04 (×33): qty 3

## 2014-05-04 MED ORDER — POTASSIUM CHLORIDE CRYS ER 20 MEQ PO TBCR
40.0000 meq | EXTENDED_RELEASE_TABLET | Freq: Once | ORAL | Status: AC
  Administered 2014-05-04: 40 meq via ORAL
  Filled 2014-05-04: qty 2

## 2014-05-04 MED ORDER — SODIUM CHLORIDE 0.9 % IV BOLUS (SEPSIS)
1000.0000 mL | Freq: Once | INTRAVENOUS | Status: AC
Start: 2014-05-04 — End: 2014-05-04
  Administered 2014-05-04: 1000 mL via INTRAVENOUS

## 2014-05-04 MED ORDER — NITROGLYCERIN 0.4 MG SL SUBL: 0.4000 mg | SUBLINGUAL_TABLET | SUBLINGUAL | Status: DC | PRN

## 2014-05-04 MED ORDER — ONDANSETRON HCL 4 MG/2ML IJ SOLN
4.0000 mg | Freq: Four times a day (QID) | INTRAMUSCULAR | Status: DC | PRN
  Administered 2014-05-04 – 2014-05-05 (×2): 4 mg via INTRAVENOUS
  Filled 2014-05-04 (×2): qty 2

## 2014-05-04 MED ORDER — BUDESONIDE 0.25 MG/2ML IN SUSP
0.2500 mg | Freq: Two times a day (BID) | RESPIRATORY_TRACT | Status: DC
  Administered 2014-05-05 – 2014-05-08 (×6): 0.25 mg via RESPIRATORY_TRACT
  Filled 2014-05-04 (×13): qty 2

## 2014-05-04 MED ORDER — LEVALBUTEROL HCL 0.63 MG/3ML IN NEBU
0.6300 mg | INHALATION_SOLUTION | Freq: Four times a day (QID) | RESPIRATORY_TRACT | Status: DC
  Administered 2014-05-04: 0.63 mg via RESPIRATORY_TRACT
  Filled 2014-05-04 (×4): qty 3

## 2014-05-04 MED ORDER — ONDANSETRON HCL 4 MG/2ML IJ SOLN
4.0000 mg | Freq: Once | INTRAMUSCULAR | Status: AC
  Administered 2014-05-04: 4 mg via INTRAVENOUS
  Filled 2014-05-04: qty 2

## 2014-05-04 MED ORDER — DILTIAZEM HCL 25 MG/5ML IV SOLN
10.0000 mg | Freq: Once | INTRAVENOUS | Status: AC
  Administered 2014-05-04: 10 mg via INTRAVENOUS

## 2014-05-04 MED ORDER — ASPIRIN EC 81 MG PO TBEC
81.0000 mg | DELAYED_RELEASE_TABLET | Freq: Every day | ORAL | Status: DC
  Administered 2014-05-04 – 2014-05-08 (×5): 81 mg via ORAL
  Filled 2014-05-04 (×5): qty 1

## 2014-05-04 MED ORDER — LABETALOL HCL 5 MG/ML IV SOLN
5.0000 mg | INTRAVENOUS | Status: DC | PRN
  Filled 2014-05-04: qty 4

## 2014-05-04 MED ORDER — IPRATROPIUM BROMIDE 0.03 % NA SOLN
2.0000 | Freq: Four times a day (QID) | NASAL | Status: DC
  Filled 2014-05-04: qty 30

## 2014-05-04 MED ORDER — SODIUM CHLORIDE 0.9 % IV SOLN
INTRAVENOUS | Status: DC
  Administered 2014-05-05: 02:00:00 via INTRAVENOUS

## 2014-05-04 MED ORDER — ADENOSINE 6 MG/2ML IV SOLN
INTRAVENOUS | Status: AC
  Filled 2014-05-04: qty 6

## 2014-05-04 MED ORDER — DILTIAZEM HCL 100 MG IV SOLR
5.0000 mg/h | Freq: Once | INTRAVENOUS | Status: AC
  Administered 2014-05-04: 5 mg/h via INTRAVENOUS

## 2014-05-04 MED ORDER — DILTIAZEM HCL 100 MG IV SOLR
5.0000 mg/h | Freq: Once | INTRAVENOUS | Status: AC
  Administered 2014-05-04: 15 mg/h via INTRAVENOUS

## 2014-05-04 MED ORDER — DEXTROSE 5 % IV SOLN
1.0000 g | INTRAVENOUS | Status: DC
  Administered 2014-05-05 – 2014-05-08 (×4): 1 g via INTRAVENOUS
  Filled 2014-05-04 (×5): qty 10

## 2014-05-04 MED ORDER — PANTOPRAZOLE SODIUM 40 MG PO TBEC
40.0000 mg | DELAYED_RELEASE_TABLET | Freq: Every day | ORAL | Status: DC
  Administered 2014-05-04 – 2014-05-08 (×5): 40 mg via ORAL
  Filled 2014-05-04 (×4): qty 1

## 2014-05-04 MED ORDER — DILTIAZEM HCL ER COATED BEADS 300 MG PO CP24
300.0000 mg | ORAL_CAPSULE | Freq: Every day | ORAL | Status: DC
  Administered 2014-05-04 – 2014-05-08 (×5): 300 mg via ORAL
  Filled 2014-05-04 (×5): qty 1

## 2014-05-04 MED ORDER — METOPROLOL SUCCINATE ER 25 MG PO TB24
25.0000 mg | ORAL_TABLET | Freq: Every day | ORAL | Status: DC
  Administered 2014-05-04 – 2014-05-08 (×5): 25 mg via ORAL
  Filled 2014-05-04 (×5): qty 1

## 2014-05-04 MED ORDER — DEXTROSE 5 % IV SOLN
500.0000 mg | INTRAVENOUS | Status: DC
  Administered 2014-05-04 – 2014-05-07 (×4): 500 mg via INTRAVENOUS
  Filled 2014-05-04 (×4): qty 500

## 2014-05-04 MED ORDER — ACETAMINOPHEN 325 MG PO TABS
650.0000 mg | ORAL_TABLET | ORAL | Status: DC | PRN
  Filled 2014-05-04: qty 2

## 2014-05-04 MED ORDER — DEXTROSE 5 % IV SOLN
1.0000 g | Freq: Once | INTRAVENOUS | Status: AC
  Administered 2014-05-04: 1 g via INTRAVENOUS
  Filled 2014-05-04: qty 10

## 2014-05-04 MED ORDER — IPRATROPIUM BROMIDE 0.06 % NA SOLN
2.0000 | Freq: Four times a day (QID) | NASAL | Status: DC
  Administered 2014-05-05 (×2): 2 via NASAL
  Filled 2014-05-04 (×2): qty 15

## 2014-05-04 MED ORDER — ATORVASTATIN CALCIUM 20 MG PO TABS
20.0000 mg | ORAL_TABLET | Freq: Every day | ORAL | Status: DC
  Administered 2014-05-04 – 2014-05-07 (×4): 20 mg via ORAL
  Filled 2014-05-04 (×5): qty 1

## 2014-05-04 MED ORDER — ENOXAPARIN SODIUM 40 MG/0.4ML ~~LOC~~ SOLN
40.0000 mg | SUBCUTANEOUS | Status: DC
  Administered 2014-05-04 – 2014-05-07 (×4): 40 mg via SUBCUTANEOUS
  Filled 2014-05-04 (×5): qty 0.4

## 2014-05-04 MED ORDER — LEVALBUTEROL HCL 0.63 MG/3ML IN NEBU
0.6300 mg | INHALATION_SOLUTION | RESPIRATORY_TRACT | Status: DC | PRN
  Administered 2014-05-04 – 2014-05-08 (×7): 0.63 mg via RESPIRATORY_TRACT
  Filled 2014-05-04 (×6): qty 3

## 2014-05-04 NOTE — Progress Notes (Signed)
[place] TEAM 1 - Stepdown/ICU TEAM Progress Note  Randa Lynntta S Jann HYQ:[id number] DOB: 1929-10-02 DOA: 05/04/2014 PCP: [name],[name], MD  Admit HPI / Brief Narrative: 81 y.o. female with history of COPD, chronic atrial fibrillation not on Coumadin secondary to GI bleed/AVMs, and CAD who presented to the ER w/ c/o nausea and weakness with subjective fever and chills over 2 days.    In the ER patient was found to be in atrial fibrillation with RVR and patient was febrile with labs showing leukocytosis. Patient was started on Cardizem infusion for atrial fibrillation with RVR. UA noted features consistent with UTI. CT abdomen and pelvis showed airspace disease concerning for pneumonia.   HPI/Subjective: Pt seen for f/u visit.   Assessment/Plan:  Sepsis due to UTI + CAP  Chronic parox afib w/ acute RVR Not on anticoag due to hx of GI AVMs  Hypokalemia  RLL CAP  UTI  Normocytic anemia  COPD  CAD s/p CABG x5 2005 w/ BMS to SVG 2014  HTN  HLD  Chronic hyponatremia Felt to be due to SIADH  AAA  Code Status: FULL Family Communication: no family present at time of exam Disposition Plan: tele bed   Consultants: none  Procedures: none  Antibiotics: azithro 1/8 > rocephin 1/7 >  DVT prophylaxis: lovenox  Objective: Blood pressure 104/58, pulse 77, temperature 98.3 F (36.8 C), temperature source Oral, resp. rate 21, height 6' (1.829 m), weight 81.647 kg (180 lb), SpO2 97 %. No intake or output data in the 24 hours ending 05/04/14 1520  Exam: Pt seen for f/u visit.   Data Reviewed: Basic Metabolic Panel:  Recent Labs Lab 05/04/14 0120 05/04/14 1047  NA 134* 133*  K 3.1* 3.4*  CL 90* 95*  CO2 33* 32  GLUCOSE 187* 188*  BUN 15 14  CREATININE 1.08 0.95  CALCIUM 10.3 9.1    Liver Function Tests:  Recent Labs Lab 05/04/14 0120 05/04/14 1047  AST 15 13  ALT 8 7  ALKPHOS 68 62  BILITOT 1.3* 1.1  PROT 6.7 5.3*  ALBUMIN 3.4* 2.6*    CBC:  Recent Labs Lab 05/04/14 0120 05/04/14 1047  WBC 24.2* 22.1*  NEUTROABS 22.3* 20.3*  HGB 12.7 10.6*  HCT 38.9 33.1*  MCV 86.4 87.8  PLT 274 221    Cardiac Enzymes:  Recent Labs Lab 05/04/14 1047  TROPONINI 0.04*   BNP (last 3 results)  Recent Labs  11/07/13 1459 11/14/13 2307 02/01/14 1000  PROBNP 181.0* 1360.0* 490.9*    Studies:  Recent x-ray studies have been reviewed in detail by the Attending Physician  Scheduled Meds:  Scheduled Meds: . aspirin EC  81 mg Oral Daily  . atorvastatin  20 mg Oral q1800  . azithromycin  500 mg Intravenous Q24H  . budesonide (PULMICORT) nebulizer solution  0.25 mg Nebulization BID  . [START ON 05/05/2014] cefTRIAXone (ROCEPHIN)  IV  1 g Intravenous Q24H  . diltiazem  300 mg Oral Daily  . enoxaparin (LOVENOX) injection  40 mg Subcutaneous Q24H  . ipratropium  2 spray Each Nare QID  . levalbuterol  0.63 mg Nebulization Q6H  . losartan  50 mg Oral Daily  . metoprolol succinate  25 mg Oral Daily  . pantoprazole  40 mg Oral Daily    Time spent on care of this patient: no charge   Lonia BloodMCCLUNG,Sorayah Schrodt T , MD   [facility] Office  [phone] Pager - Text Page per Amion as per below:  On-Call/Text Page:  [email or web address]      password [email or web address]  If 7PM-7AM, please contact night-coverage [email or web address] Password [email or web address] 05/04/2014, 3:20 PM   LOS: 0 days

## 2014-05-04 NOTE — H&P (Signed)
[facility] History and Physical  Karen Lynntta S Nicasio ZOX:[id number] DOB: 12/12/1929 DOA: 05/04/2014  Referring physician: ER physician. PCP: [name],[name], MD   Chief Complaint: Nausea and weakness.  HPI: [name] is a 81 y.o. female with history of COPD, chronic respiratory failure, chronic atrial fibrillation not on Coumadin secondary to GI bleed/AVMs, CAD presents to the ER because of nausea and weakness with subjective feeling of fever and chills. Patient states over the last 2 days she has been having nausea with no vomiting and subjective feeling of fever and chills. In the ER patient was found to be in atrial fibrillation with RVR and patient was febrile with labs showing leukocytosis. Patient was started on Cardizem infusion for atrial fibrillation with RVR. UA shows features consistent with UTI. CT abdomen and pelvis ordered showed airspace disease concerning for pneumonia. Patient has been placed on empiric antibiotics after blood cultures were obtained and admitted for further management of sepsis. Patient denies any chest pain or shortness of breath denies any productive cough. On exam patient's abdomen appears benign. Denies any diarrhea.   Review of Systems: As presented in the history of presenting illness, rest negative.  Past Medical History  Diagnosis Date  . CAD (coronary artery disease)     a. s/p CABG x5 (2005) b. s/p BMS-prox SVG-D2 (2014)  . Hypertension   . Hyperlipidemia   . GI bleed     AVMs  . Aneurysm, aortic     thoracic aorta, stable at 4.1 cm, chest CT, May, 2012  . Syncope     Nitroglycerin plus a diuretic, April, 2009  . Hyponatremia     Chronic. Felt secondary to SIADH   . COPD (chronic obstructive pulmonary disease)   . Tobacco abuse   . Bradycardia   . Carotid artery disease     Hx of endarterectomy. Doppler October, 2011, stable, 0-39% RIC A., 40-59% LICA  . Tuberculosis     Exposures to tuberculosis 1970s, has tested negative by  the health Department  . Atrial fibrillation     Paroxysmal with RVR 07/2011, not felt to be a coumadin candidate secondary to hx of GIB/AVM  . Venous stasis of lower extremity     Chronic  . Ejection fraction     a. EF 55-60%, echo, April, 2013 b. EF 55-60%, mild biatrial enlargement and PASP 42 mmH  . CHF with left ventricular diastolic dysfunction, NYHA class 2   . AAA (abdominal aortic aneurysm)   . On home O2     2L N/C  . Complication of anesthesia   . Shortness of breath   . Hx of CABG     CABG 2005  . Hematoma     Right groin hematoma, small, post cath, April, 2014  . Mitral regurgitation     Mild, hospital, March, 2014  . Leg ulcer     Ulcerated lesion on the anterior aspect of her left lower leg, June, 2014   Past Surgical History  Procedure Laterality Date  . Cholecystectomy    . Carotid endartercetomy    . Abdominal hysterectomy    . Coronary artery bypass graft  2005  . Coronary angioplasty with stent placement  07/18/12    severe native coronary artery disease, 99% proximal stenosis of the SVG-D2 status post successful PTCA/PCI with a Veriflex bare-metal stent, widely patent SVGs to both the right PDA sequential OM1 to OM 2, EF 65-70% and 3+ mitral regurgitation  . Left heart catheterization with coronary angiogram  N/A 07/18/2012    Procedure: LEFT HEART CATHETERIZATION WITH CORONARY ANGIOGRAM;  Surgeon: Marykay Lex, MD;  Location: [facility] CATH LAB;  Service: Cardiovascular;  Laterality: N/A;  . Percutaneous coronary stent intervention (pci-s)  07/18/2012    Procedure: PERCUTANEOUS CORONARY STENT INTERVENTION (PCI-S);  Surgeon: Marykay Lex, MD;  Location: [facility] CATH LAB;  Service: Cardiovascular;;   Social History:  reports that she quit smoking about 15 months ago. Her smoking use included Cigarettes. She has a 29.5 pack-year smoking history. She does not have any smokeless tobacco history on file. She reports that she does not drink alcohol. Her drug history is not on  file. Where does patient live at home. Can patient participate in ADLs? Yes.  Allergies  Allergen Reactions  . Other Other (See Comments)    TETANUS-can only take 1/2 dose at one time, can take the other 1/2 dose about 3 days later  . Codeine Nausea And Vomiting  . Sulfonamide Derivatives Nausea And Vomiting  . Warfarin     H/o AVM's prechief complaintcluding anticoagulation  . Penicillins Itching, Rash and Other (See Comments)    Nasal itching   . Ranitidine Hcl Itching and Rash    Family History:  Family History  Problem Relation Age of Onset  . Stroke Sister   . Heart failure Mother   . Cancer Sister     Breast and Lung   . Cancer Brother     breast cancer      Prior to Admission medications   Medication Sig Start Date End Date Taking? Authorizing Provider  acetaminophen (TYLENOL) 500 MG tablet Take 1,000 mg by mouth every 6 (six) hours as needed for moderate pain.    Historical Provider, MD  albuterol (PROVENTIL HFA;VENTOLIN HFA) 108 (90 BASE) MCG/ACT inhaler Inhale 2 puffs into the lungs every 6 (six) hours as needed for wheezing or shortness of breath. 03/06/14   Oretha Milch, MD  albuterol (PROVENTIL) (2.5 MG/3ML) 0.083% nebulizer solution Take 2.5 mg by nebulization every 6 (six) hours as needed for wheezing.    Historical Provider, MD  aspirin EC 81 MG tablet Take 81 mg by mouth daily.    Historical Provider, MD  atorvastatin (LIPITOR) 40 MG tablet Take 0.5 tablets (20 mg total) by mouth daily at 6 PM. 11/07/13   Rosalio Macadamia, NP  budesonide-formoterol (SYMBICORT) 160-4.5 MCG/ACT inhaler Inhale 2 puffs into the lungs 2 (two) times daily. 03/06/14   Oretha Milch, MD  diltiazem (CARDIZEM CD) 300 MG 24 hr capsule Take 1 capsule (300 mg total) by mouth daily. 11/19/13   Rhetta Mura, MD  furosemide (LASIX) 40 MG tablet Take 1 tablet (40 mg total) by mouth 2 (two) times daily. 08/29/13   Luis Abed, MD  guaiFENesin (MUCINEX) 600 MG 12 hr tablet Take 1 tablet  (600 mg total) by mouth 2 (two) times daily. 02/04/14   Gust Rung, DO  IPRATROPIUM BROMIDE NA Place 2 sprays into the nose daily as needed (for stuffiness/wheezing).     Historical Provider, MD  losartan (COZAAR) 50 MG tablet Take 50 mg by mouth daily.    Historical Provider, MD  metoprolol succinate (TOPROL-XL) 25 MG 24 hr tablet Take 1 tablet (25 mg total) by mouth daily. 11/24/13   Luis Abed, MD  Multiple Vitamins-Minerals (PRESERVISION AREDS 2 PO) Take 1 tablet by mouth 2 (two) times daily.    Historical Provider, MD  nitroGLYCERIN (NITROSTAT) 0.4 MG SL tablet Place 0.4 mg under  the tongue every 5 (five) minutes as needed for chest pain.    Historical Provider, MD  pantoprazole (PROTONIX) 40 MG tablet Take 40 mg by mouth daily.    Historical Provider, MD  Polyethyl Glycol-Propyl Glycol (SYSTANE) 0.4-0.3 % SOLN Apply 2 drops to eye daily as needed (for dry eyes).    Historical Provider, MD  potassium chloride SA (K-DUR,KLOR-CON) 20 MEQ tablet Take 20 mEq by mouth 2 (two) times daily.    Historical Provider, MD  tiotropium (SPIRIVA) 18 MCG inhalation capsule Place 1 capsule (18 mcg total) into inhaler and inhale daily. 03/06/14   Oretha Milch, MD  Vitamin D, Ergocalciferol, (DRISDOL) 50000 UNITS CAPS Take 50,000 Units by mouth every 14 (fourteen) days. Every other Sunday    Historical Provider, MD    Physical Exam: Filed Vitals:   05/04/14 0230 05/04/14 0245 05/04/14 0434 05/04/14 0449  BP: 130/75 119/58    Pulse: 138 110    Temp:    100.6 F (38.1 C)  TempSrc:    Rectal  Resp: Height:      Weight:      SpO2: 95% 97% 95%      General: Moderately built and nourished.  Eyes: Anicteric no pallor.  ENT: No discharge from the ears eyes nose or mouth.  Neck: No mass felt.  Cardiovascular: S1-S2 heard. Tachycardic.  Respiratory: No rhonchi or crepitations.  Abdomen: Soft nontender bowel sounds present. No guarding or rigidity.  Skin: No  rash.  Musculoskeletal: No edema.  Psychiatric: Appears normal.  Neurologic: Alert awake oriented to time place and person. Moves all extremities.  Labs on Admission:  Basic Metabolic Panel:  Recent Labs Lab 05/04/14 0120  NA 134*  K 3.1*  CL 90*  CO2 33*  GLUCOSE 187*  BUN 15  CREATININE 1.08  CALCIUM 10.3   Liver Function Tests:  Recent Labs Lab 05/04/14 0120  AST 15  ALT 8  ALKPHOS 68  BILITOT 1.3*  PROT 6.7  ALBUMIN 3.4*   No results for input(s): LIPASE, AMYLASE in the last 168 hours. No results for input(s): AMMONIA in the last 168 hours. CBC:  Recent Labs Lab 05/04/14 0120  WBC 24.2*  NEUTROABS 22.3*  HGB 12.7  HCT 38.9  MCV 86.4  PLT 274   Cardiac Enzymes: No results for input(s): CKTOTAL, CKMB, CKMBINDEX, TROPONINI in the last 168 hours.  BNP (last 3 results)  Recent Labs  11/07/13 1459 11/14/13 2307 02/01/14 1000  PROBNP 181.0* 1360.0* 490.9*   CBG: No results for input(s): GLUCAP in the last 168 hours.  Radiological Exams on Admission: Ct Abdomen Pelvis Wo Contrast  05/04/2014   CLINICAL DATA:  Nausea. Unable acute down medications or foods. I am oxygen all the time. Atrial fibrillation. Tachycardia. Shortness of breath.  EXAM: CT ABDOMEN AND PELVIS WITHOUT CONTRAST  TECHNIQUE: Multidetector CT imaging of the abdomen and pelvis was performed following the standard protocol without IV contrast.  COMPARISON:  None.  FINDINGS: Patchy airspace disease in the posterior right lower lung suggest pneumonia.  Surgical absence of the gallbladder. All vascular calcifications throughout the abdominal aorta and branch vessels. Ectatic infrarenal abdominal aorta with maximal diameter of 2.6 cm. Appears to be a stent in the right renal artery origin. Tiny sub cm low-attenuation lesion in the medial segment left lobe of the liver is too small to characterize. This is likely to represent a cyst or hemangioma. The unenhanced appearance of the pancreas,  spleen, adrenal glands,  kidneys, inferior vena cava, and retroperitoneal lymph nodes is unremarkable. Stomach and small bowel are decompressed. Diffusely stool-filled colon without abnormal distention. No free air or free fluid in the abdomen. Broad-based midline abdominal wall hernia containing transverse colon and small bowel without proximal obstruction.  Pelvis: The appendix is normal. Diverticulosis of sigmoid colon without evidence of diverticulitis. Bladder wall is not thickened. Uterus appears surgically absent. No pelvic mass or lymphadenopathy. No free or loculated pelvic fluid collections. Left hip arthroplasty with streak artifact limiting visualization of portions of the pelvis. Degenerative changes throughout the lumbar spine. No destructive bone lesions.  IMPRESSION: Airspace disease in the right lower lung suggesting pneumonia. No acute process demonstrated in the abdomen or pelvis. No evidence of bowel obstruction. Diverticulosis of the sigmoid colon without diverticulitis. Vascular calcifications.   Electronically Signed   By: Burman Nieves M.D.   On: 05/04/2014 04:36   Dg Chest Port 1 View  05/04/2014   CLINICAL DATA:  Shortness of breath and palpitations.  EXAM: PORTABLE CHEST - 1 VIEW  COMPARISON:  02/01/2014  FINDINGS: Postoperative changes in the mediastinum. Borderline heart size without vascular congestion. Emphysematous changes in the lungs with fibrosis in the apices and bases. No focal airspace disease or consolidation. No blunting of costophrenic angles. No pneumothorax. Calcification of aorta.  IMPRESSION: Emphysematous changes and chronic bronchitic changes in the lungs. No evidence of active pulmonary disease.   Electronically Signed   By: Burman Nieves M.D.   On: 05/04/2014 01:57    EKG: Atrial fibrillation with RVR.  Assessment/Plan Principal Problem:   Sepsis Active Problems:   COPD (chronic obstructive pulmonary disease)   Chronic diastolic CHF (congestive heart  failure)   UTI (lower urinary tract infection)   Atrial fibrillation with RVR   1. Sepsis - most likely secondary to pneumonia and also possible UTI. Check influenza PCR strep antigen and Legionella antigen. Continue with empiric antibiotics. Patient will be admitted to stepdown secondary to patient having A. fib with RVR. 2. Atrial fibrillation with RVR - patient has been placed on Cardizem infusion and once patient can take oral rate limiting medication patient's Cardizem infusion can be tapered. Patient probably having A. fib with RVR from #1 reason. Check thyroid function test. Patient was felt not be a candidate for anti-correlation secondary to history of GI bleed and AVMs. 3. CAD status post CABG - denies any chest pain but check troponin. #2. On aspirin. 4. COPD - present is not wheezing. Continue nebulizers. Will place patient on Xopenex instead of albuterol secondary to A. fib with RVR. 5. Chronic diastolic heart failure last year measured was 65-70% with mitral regurgitation - presently patient is on IV fluids secondary to #1. Closely observe respiratory status. Patient's diuretics on hold for now. 6. Hyperlipidemia - continue present medications.   DVT Prophylaxis Lovenox.  Code Status: Full code.  Family Communication: None.  Disposition Plan: Admit to inpatient.    Jadda Hunsucker N. [facility] Pager [phone].  If 7PM-7AM, please contact night-coverage [email or web address] Password [email or web address] 05/04/2014, 5:16 AM

## 2014-05-04 NOTE — ED Notes (Signed)
Breakfast tray ordered for pt

## 2014-05-04 NOTE — ED Provider Notes (Signed)
CSN: [id number]     Arrival date & time 05/04/14  0112 History  This chart was scribed for Richardean Canal, MD by Murriel Hopper, ED Scribe. This patient was seen in room D33C/D33C and the patient's care was started at 1:18 AM.    Chief Complaint  Patient presents with  . Nausea      The history is provided by the patient. No language interpreter was used.     HPI Comments: [name] is a 81 y.o. female with a Hx of AFib brought in by EMS who presents to the Emergency Department complaining of constant nausea with associated SOB that has been present since yesterday evening. Pt arrived to ED with a HR of 170 bpm and in AFib as per EMS. Also has diffuse weakness. Pt denies chest pain, fever, and denies being on blood thinners.   Level V caveat- condition of patient    Past Medical History  Diagnosis Date  . CAD (coronary artery disease)     a. s/p CABG x5 (2005) b. s/p BMS-prox SVG-D2 (2014)  . Hypertension   . Hyperlipidemia   . GI bleed     AVMs  . Aneurysm, aortic     thoracic aorta, stable at 4.1 cm, chest CT, May, 2012  . Syncope     Nitroglycerin plus a diuretic, April, 2009  . Hyponatremia     Chronic. Felt secondary to SIADH   . COPD (chronic obstructive pulmonary disease)   . Tobacco abuse   . Bradycardia   . Carotid artery disease     Hx of endarterectomy. Doppler October, 2011, stable, 0-39% RIC A., 40-59% LICA  . Tuberculosis     Exposures to tuberculosis 1970s, has tested negative by the health Department  . Atrial fibrillation     Paroxysmal with RVR 07/2011, not felt to be a coumadin candidate secondary to hx of GIB/AVM  . Venous stasis of lower extremity     Chronic  . Ejection fraction     a. EF 55-60%, echo, April, 2013 b. EF 55-60%, mild biatrial enlargement and PASP 42 mmH  . CHF with left ventricular diastolic dysfunction, NYHA class 2   . AAA (abdominal aortic aneurysm)   . On home O2     2L N/C  . Complication of anesthesia   . Shortness of  breath   . Hx of CABG     CABG 2005  . Hematoma     Right groin hematoma, small, post cath, April, 2014  . Mitral regurgitation     Mild, hospital, March, 2014  . Leg ulcer     Ulcerated lesion on the anterior aspect of her left lower leg, June, 2014   Past Surgical History  Procedure Laterality Date  . Cholecystectomy    . Carotid endartercetomy    . Abdominal hysterectomy    . Coronary artery bypass graft  2005  . Coronary angioplasty with stent placement  07/18/12    severe native coronary artery disease, 99% proximal stenosis of the SVG-D2 status post successful PTCA/PCI with a Veriflex bare-metal stent, widely patent SVGs to both the right PDA sequential OM1 to OM 2, EF 65-70% and 3+ mitral regurgitation  . Left heart catheterization with coronary angiogram N/A 07/18/2012    Procedure: LEFT HEART CATHETERIZATION WITH CORONARY ANGIOGRAM;  Surgeon: Marykay Lex, MD;  Location: [facility] CATH LAB;  Service: Cardiovascular;  Laterality: N/A;  . Percutaneous coronary stent intervention (pci-s)  07/18/2012    Procedure: PERCUTANEOUS  CORONARY STENT INTERVENTION (PCI-S);  Surgeon: Marykay Lex, MD;  Location: [facility] CATH LAB;  Service: Cardiovascular;;   Family History  Problem Relation Age of Onset  . Stroke Sister   . Heart failure Mother   . Cancer Sister     Breast and Lung   . Cancer Brother     breast cancer   History  Substance Use Topics  . Smoking status: Former Smoker -- 0.50 packs/day for 59 years    Types: Cigarettes    Quit date: 02/01/2013  . Smokeless tobacco: Not on file  . Alcohol Use: No   OB History    No data available     Review of Systems  Constitutional: Negative for fever.  Respiratory: Positive for shortness of breath.   Cardiovascular: Negative for chest pain.  Gastrointestinal: Positive for nausea.  All other systems reviewed and are negative.     Allergies  Other; Codeine; Sulfonamide derivatives; Warfarin; Penicillins; and Ranitidine  hcl  Home Medications   Prior to Admission medications   Medication Sig Start Date End Date Taking? Authorizing Provider  acetaminophen (TYLENOL) 500 MG tablet Take 1,000 mg by mouth every 6 (six) hours as needed for moderate pain.    Historical Provider, MD  albuterol (PROVENTIL HFA;VENTOLIN HFA) 108 (90 BASE) MCG/ACT inhaler Inhale 2 puffs into the lungs every 6 (six) hours as needed for wheezing or shortness of breath. 03/06/14   Oretha Milch, MD  albuterol (PROVENTIL) (2.5 MG/3ML) 0.083% nebulizer solution Take 2.5 mg by nebulization every 6 (six) hours as needed for wheezing.    Historical Provider, MD  aspirin EC 81 MG tablet Take 81 mg by mouth daily.    Historical Provider, MD  atorvastatin (LIPITOR) 40 MG tablet Take 0.5 tablets (20 mg total) by mouth daily at 6 PM. 11/07/13   Rosalio Macadamia, NP  budesonide-formoterol (SYMBICORT) 160-4.5 MCG/ACT inhaler Inhale 2 puffs into the lungs 2 (two) times daily. 03/06/14   Oretha Milch, MD  diltiazem (CARDIZEM CD) 300 MG 24 hr capsule Take 1 capsule (300 mg total) by mouth daily. 11/19/13   Rhetta Mura, MD  furosemide (LASIX) 40 MG tablet Take 1 tablet (40 mg total) by mouth 2 (two) times daily. 08/29/13   Luis Abed, MD  guaiFENesin (MUCINEX) 600 MG 12 hr tablet Take 1 tablet (600 mg total) by mouth 2 (two) times daily. 02/04/14   Gust Rung, DO  IPRATROPIUM BROMIDE NA Place 2 sprays into the nose daily as needed (for stuffiness/wheezing).     Historical Provider, MD  losartan (COZAAR) 50 MG tablet Take 50 mg by mouth daily.    Historical Provider, MD  metoprolol succinate (TOPROL-XL) 25 MG 24 hr tablet Take 1 tablet (25 mg total) by mouth daily. 11/24/13   Luis Abed, MD  Multiple Vitamins-Minerals (PRESERVISION AREDS 2 PO) Take 1 tablet by mouth 2 (two) times daily.    Historical Provider, MD  nitroGLYCERIN (NITROSTAT) 0.4 MG SL tablet Place 0.4 mg under the tongue every 5 (five) minutes as needed for chest pain.    Historical  Provider, MD  pantoprazole (PROTONIX) 40 MG tablet Take 40 mg by mouth daily.    Historical Provider, MD  Polyethyl Glycol-Propyl Glycol (SYSTANE) 0.4-0.3 % SOLN Apply 2 drops to eye daily as needed (for dry eyes).    Historical Provider, MD  potassium chloride SA (K-DUR,KLOR-CON) 20 MEQ tablet Take 20 mEq by mouth 2 (two) times daily.    Historical Provider, MD  tiotropium (SPIRIVA) 18 MCG inhalation capsule Place 1 capsule (18 mcg total) into inhaler and inhale daily. 03/06/14   Oretha Milch, MD  Vitamin D, Ergocalciferol, (DRISDOL) 50000 UNITS CAPS Take 50,000 Units by mouth every 14 (fourteen) days. Every other Sunday    Historical Provider, MD   BP 119/58 mmHg  Pulse 110  Temp(Src) 99.8 F (37.7 C) (Oral)  Resp 22  Ht 6' (1.829 m)  Wt 180 lb (81.647 kg)  BMI 24.41 kg/m2  SpO2 97% Physical Exam  Constitutional: She is oriented to person, place, and time. She appears well-developed and well-nourished.  HENT:  Head: Normocephalic and atraumatic.  Cardiovascular: Normal rate.   Pulmonary/Chest: Effort normal.  Diminished bilateral bases No peripheral edema   Abdominal: She exhibits no distension.  Abdomen soft and nontender  Neurological: She is alert and oriented to person, place, and time.  Skin: Skin is warm and dry.  Psychiatric: She has a normal mood and affect.  Nursing note and vitals reviewed.   ED Course  Procedures (including critical care time)  DIAGNOSTIC STUDIES: Oxygen Saturation is 93% on [place], low by my interpretation.    COORDINATION OF CARE: 1:20 AM Discussed treatment plan with pt at bedside and pt agreed to plan.  CRITICAL CARE Performed by: Silverio Lay, Fortunata Betty   Total critical care time: 30 min   Critical care time was exclusive of separately billable procedures and treating other patients.  Critical care was necessary to treat or prevent imminent or life-threatening deterioration.  Critical care was time spent personally by me on the following  activities: development of treatment plan with patient and/or surrogate as well as nursing, discussions with consultants, evaluation of patient's response to treatment, examination of patient, obtaining history from patient or surrogate, ordering and performing treatments and interventions, ordering and review of laboratory studies, ordering and review of radiographic studies, pulse oximetry and re-evaluation of patient's condition.   Labs Review Labs Reviewed  CBC WITH DIFFERENTIAL - Abnormal; Notable for the following:    WBC 24.2 (*)    Neutrophils Relative % 93 (*)    Neutro Abs 22.3 (*)    Lymphocytes Relative 2 (*)    Lymphs Abs 0.6 (*)    Monocytes Absolute 1.3 (*)    All other components within normal limits  COMPREHENSIVE METABOLIC PANEL - Abnormal; Notable for the following:    Sodium 134 (*)    Potassium 3.1 (*)    Chloride 90 (*)    CO2 33 (*)    Glucose, Bld 187 (*)    Albumin 3.4 (*)    Total Bilirubin 1.3 (*)    GFR calc non Af Amer 46 (*)    GFR calc Af Amer 54 (*)    All other components within normal limits  URINALYSIS, ROUTINE W REFLEX MICROSCOPIC - Abnormal; Notable for the following:    Color, Urine AMBER (*)    APPearance CLOUDY (*)    Hgb urine dipstick TRACE (*)    Bilirubin Urine SMALL (*)    Ketones, ur 15 (*)    Protein, ur 30 (*)    Nitrite POSITIVE (*)    Leukocytes, UA MODERATE (*)    All other components within normal limits  URINE MICROSCOPIC-ADD ON - Abnormal; Notable for the following:    Bacteria, UA MANY (*)    All other components within normal limits  CULTURE, BLOOD (ROUTINE X 2)  CULTURE, BLOOD (ROUTINE X 2)  URINE CULTURE  I-STAT TROPOININ, ED  I-STAT CG4  LACTIC ACID, ED    Imaging Review Dg Chest Port 1 View  05/04/2014   CLINICAL DATA:  Shortness of breath and palpitations.  EXAM: PORTABLE CHEST - 1 VIEW  COMPARISON:  02/01/2014  FINDINGS: Postoperative changes in the mediastinum. Borderline heart size without vascular congestion.  Emphysematous changes in the lungs with fibrosis in the apices and bases. No focal airspace disease or consolidation. No blunting of costophrenic angles. No pneumothorax. Calcification of aorta.  IMPRESSION: Emphysematous changes and chronic bronchitic changes in the lungs. No evidence of active pulmonary disease.   Electronically Signed   By: Burman NievesWilliam  Stevens M.D.   On: 05/04/2014 01:57     EKG Interpretation   Date/Time:  Friday May 04 2014 01:17:30 EST Ventricular Rate:  148 PR Interval:    QRS Duration: 88 QT Interval:  304 QTC Calculation: 477 R Axis:   -44 Text Interpretation:  Atrial fibrillation with rapid V-rate Left axis  deviation Repolarization abnormality, prob rate related No significant  change since last tracing Confirmed by Lailanie Hasley  MD, Sherril Shipman ([id number]) on 05/04/2014  1:24:56 AM      MDM   Final diagnoses:  Palpitations   [name] is a 81 y.o. female here with rapid afib, nausea, weakness. Consider sepsis vs ACS. Will check labs, UA, CXR. Will give cardizem and reassess.  3:42 AM WBC 24. Code sepsis initiated. CXR nl. UA + UTI. Given ceftriaxone. After cardizem 10 mg, still tachy in 120s. She is currently on cardizem drip. Will admit to stepdown.   I personally performed the services described in this documentation, which was scribed in my presence. The recorded information has been reviewed and is accurate.   Richardean Canalavid H Gen Clagg, MD 05/04/14 [phone]

## 2014-05-04 NOTE — ED Notes (Signed)
Pt to ED via [facility] from home for evaluation of nausea - denies vomiting- pt has been unable to keep down medications or foods.  Pt is on home oxygen 2 liters all the time.  Upon arrival to ED pt placed on heart monitor and noted to have a rate of 170 bpm and in a-fib.  Pt denies CP at this time, admits to increased SOB.

## 2014-05-04 NOTE — Progress Notes (Signed)
Patient accepted to 2W from ED, linen and pads saturated in urine. Patient desaturated upon transfer from stretcher to bed to low 80's. Increased 02 to 4L, otherwise VSS. Daughter at bedside. Patient and family oriented to unit and floor. Will continue to monitor.

## 2014-05-04 NOTE — ED Notes (Signed)
Pt requesting water. Small amount of water give.

## 2014-05-04 NOTE — ED Notes (Signed)
Sending atrovent back to pharmacy to be re-labeled

## 2014-05-04 NOTE — ED Notes (Addendum)
Pt sats droped to upper 80s while in room.  O2 was turned up to 4 LPM shortly.  Sats improved to 91%.  Now back on 2LPM [place].  Pt SOB

## 2014-05-05 ENCOUNTER — Inpatient Hospital Stay (HOSPITAL_COMMUNITY): Payer: Medicare Other

## 2014-05-05 LAB — COMPREHENSIVE METABOLIC PANEL
ALT: 6 U/L (ref 0–35)
ANION GAP: 8 (ref 5–15)
AST: 12 U/L (ref 0–37)
Albumin: 2.5 g/dL — ABNORMAL LOW (ref 3.5–5.2)
Alkaline Phosphatase: 64 U/L (ref 39–117)
BILIRUBIN TOTAL: 0.8 mg/dL (ref 0.3–1.2)
BUN: 17 mg/dL (ref 6–23)
CALCIUM: 9.1 mg/dL (ref 8.4–10.5)
CHLORIDE: 97 meq/L (ref 96–112)
CO2: 30 mmol/L (ref 19–32)
Creatinine, Ser: 1.14 mg/dL — ABNORMAL HIGH (ref 0.50–1.10)
GFR calc Af Amer: 50 mL/min — ABNORMAL LOW (ref >90)
GFR calc non Af Amer: 44 mL/min — ABNORMAL LOW (ref >90)
Glucose, Bld: 135 mg/dL — ABNORMAL HIGH (ref 70–99)
Potassium: 3.7 mmol/L (ref 3.5–5.1)
Sodium: 135 mmol/L (ref 135–145)
Total Protein: 5 g/dL — ABNORMAL LOW (ref 6.0–8.3)

## 2014-05-05 LAB — CBC
HCT: 31.9 % — ABNORMAL LOW (ref 36.0–46.0)
Hemoglobin: 9.9 g/dL — ABNORMAL LOW (ref 12.0–15.0)
MCH: 27.6 pg (ref 26.0–34.0)
MCHC: 31 g/dL (ref 30.0–36.0)
MCV: 88.9 fL (ref 78.0–100.0)
PLATELETS: 208 10*3/uL (ref 150–400)
RBC: 3.59 MIL/uL — AB (ref 3.87–5.11)
RDW: 14.5 % (ref 11.5–15.5)
WBC: 13.2 10*3/uL — ABNORMAL HIGH (ref 4.0–10.5)

## 2014-05-05 LAB — TROPONIN I
Troponin I: 0.08 ng/mL — ABNORMAL HIGH (ref <0.031)
Troponin I: 0.06 ng/mL — ABNORMAL HIGH (ref <0.031)

## 2014-05-05 LAB — HIV ANTIBODY (ROUTINE TESTING W REFLEX): HIV-1/HIV-2 Ab: NONREACTIVE

## 2014-05-05 LAB — MAGNESIUM: Magnesium: 1.6 mg/dL (ref 1.5–2.5)

## 2014-05-05 LAB — T3, FREE: T3 FREE: 1.5 pg/mL — AB (ref 2.0–4.4)

## 2014-05-05 MED ORDER — CEPASTAT 14.5 MG MT LOZG
1.0000 | LOZENGE | OROMUCOSAL | Status: DC | PRN
  Administered 2014-05-05 – 2014-05-06 (×2): 1 via BUCCAL
  Filled 2014-05-05: qty 9

## 2014-05-05 MED ORDER — IPRATROPIUM BROMIDE 0.02 % IN SOLN
0.5000 mg | Freq: Four times a day (QID) | RESPIRATORY_TRACT | Status: DC
  Administered 2014-05-05 – 2014-05-08 (×13): 0.5 mg via RESPIRATORY_TRACT
  Filled 2014-05-05 (×13): qty 2.5

## 2014-05-05 MED ORDER — FUROSEMIDE 40 MG PO TABS
40.0000 mg | ORAL_TABLET | Freq: Two times a day (BID) | ORAL | Status: DC
  Administered 2014-05-05 – 2014-05-08 (×6): 40 mg via ORAL
  Filled 2014-05-05 (×9): qty 1

## 2014-05-05 MED ORDER — POLYVINYL ALCOHOL 1.4 % OP SOLN
2.0000 [drp] | OPHTHALMIC | Status: DC | PRN
  Administered 2014-05-05 – 2014-05-06 (×2): 2 [drp] via OPHTHALMIC
  Filled 2014-05-05: qty 15

## 2014-05-05 MED ORDER — ACETAMINOPHEN 325 MG PO TABS
325.0000 mg | ORAL_TABLET | Freq: Once | ORAL | Status: AC
  Administered 2014-05-05: 325 mg via ORAL

## 2014-05-05 NOTE — Progress Notes (Signed)
PROGRESS NOTE  Randa Lynntta S Wiltsie ZOX:[id number] DOB: 1929/08/16 DOA: 05/04/2014 PCP: [name],[name], MD  Assessment/Plan: Sepsis -Present at the time of admission -Improved, afebrile and hemodynamically stable -Secondary to CAP and UTI -Influenza PCR negative Community acquired pneumonia -Continue ceftriaxone and azithromycin -Follow up blood culture data Pyuria - continue antibiotics pending culture data Chronic atrial fibrillation with RVR -Rate controlled -Not a candidate for anticoagulation secondary to GI bleed and AVMs -Continue metoprolol succinate -Continue diltiazem CD -07/19/12 Echo EF 55-60%  -continue aspirin -TSH is 0.892 Chronic respiratory failure/COPD -Patient is maintained on 2 L nasal cannula at home -No wheezing presently -Continue Pulmicort nebs CAD s/p CABG x5 2005 w/ BMS to SVG 2014 -pt c/o chest discomfort -repeat EKG -cycle troponins  -Continue atorvastatin hypertension  -Controlled on diltiazem and metoprolol succinate    Family Communication:   Pt at beside Disposition Plan:   Home when medically stable       Procedures/Studies: Ct Abdomen Pelvis Wo Contrast  05/04/2014   CLINICAL DATA:  Nausea. Unable acute down medications or foods. I am oxygen all the time. Atrial fibrillation. Tachycardia. Shortness of breath.  EXAM: CT ABDOMEN AND PELVIS WITHOUT CONTRAST  TECHNIQUE: Multidetector CT imaging of the abdomen and pelvis was performed following the standard protocol without IV contrast.  COMPARISON:  None.  FINDINGS: Patchy airspace disease in the posterior right lower lung suggest pneumonia.  Surgical absence of the gallbladder. All vascular calcifications throughout the abdominal aorta and branch vessels. Ectatic infrarenal abdominal aorta with maximal diameter of 2.6 cm. Appears to be a stent in the right renal artery origin. Tiny sub cm low-attenuation lesion in the medial segment left lobe of the liver is too small to  characterize. This is likely to represent a cyst or hemangioma. The unenhanced appearance of the pancreas, spleen, adrenal glands, kidneys, inferior vena cava, and retroperitoneal lymph nodes is unremarkable. Stomach and small bowel are decompressed. Diffusely stool-filled colon without abnormal distention. No free air or free fluid in the abdomen. Broad-based midline abdominal wall hernia containing transverse colon and small bowel without proximal obstruction.  Pelvis: The appendix is normal. Diverticulosis of sigmoid colon without evidence of diverticulitis. Bladder wall is not thickened. Uterus appears surgically absent. No pelvic mass or lymphadenopathy. No free or loculated pelvic fluid collections. Left hip arthroplasty with streak artifact limiting visualization of portions of the pelvis. Degenerative changes throughout the lumbar spine. No destructive bone lesions.  IMPRESSION: Airspace disease in the right lower lung suggesting pneumonia. No acute process demonstrated in the abdomen or pelvis. No evidence of bowel obstruction. Diverticulosis of the sigmoid colon without diverticulitis. Vascular calcifications.   Electronically Signed   By: Burman NievesWilliam  Stevens M.D.   On: 05/04/2014 04:36   Dg Chest Port 1 View  05/04/2014   CLINICAL DATA:  Shortness of breath and palpitations.  EXAM: PORTABLE CHEST - 1 VIEW  COMPARISON:  02/01/2014  FINDINGS: Postoperative changes in the mediastinum. Borderline heart size without vascular congestion. Emphysematous changes in the lungs with fibrosis in the apices and bases. No focal airspace disease or consolidation. No blunting of costophrenic angles. No pneumothorax. Calcification of aorta.  IMPRESSION: Emphysematous changes and chronic bronchitic changes in the lungs. No evidence of active pulmonary disease.   Electronically Signed   By: Burman NievesWilliam  Stevens M.D.   On: 05/04/2014 01:57         Subjective: Patient has been complaining of some intermittent chest  discomfort. She still  having some shortness of breath, but it is better than the day of admission. Denies any nausea, vomiting, diarrhea, abdominal pain, dysuria.   Objective: Filed Vitals:   05/04/14 2040 05/05/14 0028 05/05/14 0621 05/05/14 0805  BP: 111/52  116/66   Pulse: 77 74 88   Temp: 98.7 F (37.1 C)  98 F (36.7 C)   TempSrc: Axillary  Oral   Resp: Height:      Weight:   85.5 kg (188 lb 7.9 oz)   SpO2: 100% 97% 95% 96%    Intake/Output Summary (Last 24 hours) at 05/05/14 1039 Last data filed at 05/05/14 0900  Gross per 24 hour  Intake   1310 ml  Output      0 ml  Net   1310 ml   Weight change: 3.852 kg (8 lb 7.9 oz) Exam:   General:  Pt is alert, follows commands appropriately, not in acute distress  HEENT: No icterus, No thrush,  [place]/AT  Cardiovascular: IRRR, S1/S2, no rubs, no gallops  Respiratory: bibasilar crackles, right greater than left. No wheezes   Abdomen: Soft/+BS, non tender, non distended, no guarding  Extremities: No edema, No lymphangitis, No petechiae, No rashes, no synovitis  Data Reviewed: Basic Metabolic Panel:  Recent Labs Lab 05/04/14 0120 05/04/14 1047 05/05/14 0321  NA 134* 133* 135  K 3.1* 3.4* 3.7  CL 90* 95* 97  CO2 33* 32 30  GLUCOSE 187* 188* 135*  BUN CREATININE 1.08 0.95 1.14*  CALCIUM 10.3 9.1 9.1  MG  --   --  1.6   Liver Function Tests:  Recent Labs Lab 05/04/14 0120 05/04/14 1047 05/05/14 0321  AST ALT ALKPHOS 68 62 64  BILITOT 1.3* 1.1 0.8  PROT 6.7 5.3* 5.0*  ALBUMIN 3.4* 2.6* 2.5*   No results for input(s): LIPASE, AMYLASE in the last 168 hours. No results for input(s): AMMONIA in the last 168 hours. CBC:  Recent Labs Lab 05/04/14 0120 05/04/14 1047 05/05/14 0321  WBC 24.2* 22.1* 13.2*  NEUTROABS 22.3* 20.3*  --   HGB 12.7 10.6* 9.9*  HCT 38.9 33.1* 31.9*  MCV 86.4 87.8 88.9  PLT 274 221 208   Cardiac Enzymes:  Recent Labs Lab 05/04/14 1047    TROPONINI 0.04*   BNP: Invalid input(s): POCBNP CBG: No results for input(s): GLUCAP in the last 168 hours.  No results found for this or any previous visit (from the past 240 hour(s)).   Scheduled Meds: . aspirin EC  81 mg Oral Daily  . atorvastatin  20 mg Oral q1800  . azithromycin  500 mg Intravenous Q24H  . budesonide (PULMICORT) nebulizer solution  0.25 mg Nebulization BID  . cefTRIAXone (ROCEPHIN)  IV  1 g Intravenous Q24H  . diltiazem  300 mg Oral Daily  . enoxaparin (LOVENOX) injection  40 mg Subcutaneous Q24H  . ipratropium  2 spray Each Nare QID  . levalbuterol  0.63 mg Nebulization QID  . metoprolol succinate  25 mg Oral Daily  . pantoprazole  40 mg Oral Daily   Continuous Infusions: . sodium chloride 75 mL/hr at 05/05/14 0130     Trenton Verne, DO  [facility] Pager [phone]  If 7PM-7AM, please contact night-coverage [email or web address] Password [email or web address] 05/05/2014, 10:39 AM   LOS: 1 day

## 2014-05-05 NOTE — Progress Notes (Signed)
Patient was getting cleaned up and the NT called me into the room to get her up in the bed so she can breath better. Called respiratory for breathing treatment and they just gave her one at 11:30. Oxygen saturation was 81% on 2 L. I put it on 4 L and O2 sats came up to 96% after about 2 minutes. Notified Dr. Arbutus Leasat.

## 2014-05-05 NOTE — Progress Notes (Signed)
Notified by RN of decreased oxygen saturation into the 80s  I went to evaluate the patient around 1140. The patient was resting comfortably without any distress. She did complain of slight increase in shortness of breath. She denied any worsening chest discomfort, nausea, vomiting, abdominal pain.  I personally checked vital signs  HR--84--110/62--98% on 4L Decreased oxygen to 3 L-->97% -Saline lock IV fluids -Obtained portable chest x-ray -Added Atrovent nebulizers -restart home dose furosemide  DTat

## 2014-05-06 LAB — BASIC METABOLIC PANEL
Anion gap: 7 (ref 5–15)
BUN: 13 mg/dL (ref 6–23)
CO2: 31 mmol/L (ref 19–32)
CREATININE: 0.95 mg/dL (ref 0.50–1.10)
Calcium: 9.4 mg/dL (ref 8.4–10.5)
Chloride: 96 mEq/L (ref 96–112)
GFR calc non Af Amer: 54 mL/min — ABNORMAL LOW (ref >90)
GFR, EST AFRICAN AMERICAN: 63 mL/min — AB (ref >90)
GLUCOSE: 144 mg/dL — AB (ref 70–99)
POTASSIUM: 3.3 mmol/L — AB (ref 3.5–5.1)
Sodium: 134 mmol/L — ABNORMAL LOW (ref 135–145)

## 2014-05-06 LAB — URINE CULTURE: Colony Count: >=100000

## 2014-05-06 LAB — CBC
HCT: 30.5 % — ABNORMAL LOW (ref 36.0–46.0)
Hemoglobin: 9.7 g/dL — ABNORMAL LOW (ref 12.0–15.0)
MCH: 27.8 pg (ref 26.0–34.0)
MCHC: 31.8 g/dL (ref 30.0–36.0)
MCV: 87.4 fL (ref 78.0–100.0)
PLATELETS: 198 10*3/uL (ref 150–400)
RBC: 3.49 MIL/uL — ABNORMAL LOW (ref 3.87–5.11)
RDW: 14.4 % (ref 11.5–15.5)
WBC: 9.1 10*3/uL (ref 4.0–10.5)

## 2014-05-06 LAB — EXPECTORATED SPUTUM ASSESSMENT W GRAM STAIN, RFLX TO RESP C

## 2014-05-06 LAB — GRAM STAIN

## 2014-05-06 LAB — EXPECTORATED SPUTUM ASSESSMENT W REFEX TO RESP CULTURE

## 2014-05-06 MED ORDER — POTASSIUM CHLORIDE CRYS ER 20 MEQ PO TBCR
40.0000 meq | EXTENDED_RELEASE_TABLET | Freq: Once | ORAL | Status: AC
  Administered 2014-05-06: 40 meq via ORAL
  Filled 2014-05-06: qty 2

## 2014-05-06 NOTE — Progress Notes (Signed)
PROGRESS NOTE  [name] JXB:[id number] DOB: 1929-12-28 DOA: 05/04/2014 PCP: Kaleen Mask, MD  Assessment/Plan: Sepsis -Present at the time of admission -Improved, afebrile and hemodynamically stable -Secondary to CAP and UTI -Influenza PCR negative Community acquired pneumonia -Continue ceftriaxone and azithromycin -Follow up blood culture data--neg -Repeat x-ray 05/05/2014--"new" RLL consolidation UTI--Klebsiella - continueceftriaxone Chronic atrial fibrillation with RVR -Rate controlled -Not a candidate for anticoagulation secondary to GI bleed and AVMs -Continue metoprolol succinate -Continue diltiazem CD -07/19/12 Echo EF 55-60%  -continue aspirin -TSH is 0.892 Chronic respiratory failure/COPD -Patient is maintained on 2 L nasal cannula at home -No wheezing presently -Continue Pulmicort nebs CAD s/p CABG x5 2005 w/ BMS to SVG 2014 -pt c/o chest discomfort -continue ASA -Continue atorvastatin Elevated troponin -minimal -due to demand ischemia hypertension  -Controlled on diltiazem and metoprolol succinate     Family Communication:   Pt at beside Disposition Plan:   Home when medically stable       Procedures/Studies: Ct Abdomen Pelvis Wo Contrast  05/04/2014   CLINICAL DATA:  Nausea. Unable acute down medications or foods. I am oxygen all the time. Atrial fibrillation. Tachycardia. Shortness of breath.  EXAM: CT ABDOMEN AND PELVIS WITHOUT CONTRAST  TECHNIQUE: Multidetector CT imaging of the abdomen and pelvis was performed following the standard protocol without IV contrast.  COMPARISON:  None.  FINDINGS: Patchy airspace disease in the posterior right lower lung suggest pneumonia.  Surgical absence of the gallbladder. All vascular calcifications throughout the abdominal aorta and branch vessels. Ectatic infrarenal abdominal aorta with maximal diameter of 2.6 cm. Appears to be a stent in the right renal artery origin. Tiny sub cm  low-attenuation lesion in the medial segment left lobe of the liver is too small to characterize. This is likely to represent a cyst or hemangioma. The unenhanced appearance of the pancreas, spleen, adrenal glands, kidneys, inferior vena cava, and retroperitoneal lymph nodes is unremarkable. Stomach and small bowel are decompressed. Diffusely stool-filled colon without abnormal distention. No free air or free fluid in the abdomen. Broad-based midline abdominal wall hernia containing transverse colon and small bowel without proximal obstruction.  Pelvis: The appendix is normal. Diverticulosis of sigmoid colon without evidence of diverticulitis. Bladder wall is not thickened. Uterus appears surgically absent. No pelvic mass or lymphadenopathy. No free or loculated pelvic fluid collections. Left hip arthroplasty with streak artifact limiting visualization of portions of the pelvis. Degenerative changes throughout the lumbar spine. No destructive bone lesions.  IMPRESSION: Airspace disease in the right lower lung suggesting pneumonia. No acute process demonstrated in the abdomen or pelvis. No evidence of bowel obstruction. Diverticulosis of the sigmoid colon without diverticulitis. Vascular calcifications.   Electronically Signed   By: Burman Nieves M.D.   On: 05/04/2014 04:36   Dg Chest Port 1 View  05/05/2014   CLINICAL DATA:  81 year old female with chest congestion, cough and increased oxygen requirement.  EXAM: PORTABLE CHEST - 1 VIEW  COMPARISON:  Chest x-ray 05/04/2014.  FINDINGS: New opacity in the right lung base, concerning for developing pneumonia (better demonstrated on recent CT examination 05/04/2014). Small right pleural effusion. Left lung is clear. No evidence of pulmonary edema. Heart size is mildly enlarged. Upper mediastinal contours are within normal limits. Atherosclerosis in the thoracic aorta. Status post median sternotomy for CABG.  IMPRESSION: 1. New airspace consolidation in the right  lower lobe, concerning for developing pneumonia. Small right pleural effusion. 2. Atherosclerosis. 3. Mild cardiomegaly.  Electronically Signed   By: Trudie Reedaniel  Entrikin M.D.   On: 05/05/2014 15:24   Dg Chest Port 1 View  05/04/2014   CLINICAL DATA:  Shortness of breath and palpitations.  EXAM: PORTABLE CHEST - 1 VIEW  COMPARISON:  02/01/2014  FINDINGS: Postoperative changes in the mediastinum. Borderline heart size without vascular congestion. Emphysematous changes in the lungs with fibrosis in the apices and bases. No focal airspace disease or consolidation. No blunting of costophrenic angles. No pneumothorax. Calcification of aorta.  IMPRESSION: Emphysematous changes and chronic bronchitic changes in the lungs. No evidence of active pulmonary disease.   Electronically Signed   By: Burman NievesWilliam  Stevens M.D.   On: 05/04/2014 01:57         Subjective: Patient continues to complain of intermittent shortness of breath but overall is improved. Complains of a nonproductive cough. Denies any fevers, chills, chest pain, nausea, vomiting, diarrhea, abdominal pain.  Objective: Filed Vitals:   05/06/14 0419 05/06/14 0751 05/06/14 1211 05/06/14 1326  BP: 113/59   103/46  Pulse: 85   78  Temp: 98.3 F (36.8 C)   98.2 F (36.8 C)  TempSrc: Oral   Oral  Resp: 18   18  Height:      Weight: 85.8 kg (189 lb 2.5 oz)     SpO2: 96% 99% 95% 92%    Intake/Output Summary (Last 24 hours) at 05/06/14 1739 Last data filed at 05/06/14 1300  Gross per 24 hour  Intake    120 ml  Output      0 ml  Net    120 ml   Weight change: 0.3 kg (10.6 oz) Exam:   General:  Pt is alert, follows commands appropriately, not in acute distress  HEENT: No icterus, No thrush, [place]/AT  Cardiovascular: RRR, S1/S2, no rubs, no gallops  Respiratory: Diminished breath sounds, bibasilar crackles, right greater than left. No wheezing.  Abdomen: Soft/+BS, non tender, non distended, no guarding  Extremities: trace LE edema, No  lymphangitis, No petechiae, No rashes, no synovitis  Data Reviewed: Basic Metabolic Panel:  Recent Labs Lab 05/04/14 0120 05/04/14 1047 05/05/14 0321 05/06/14 0436  NA 134* 133* 135 134*  K 3.1* 3.4* 3.7 3.3*  CL 90* 95* 97 96  CO2 33* 32 30 31  GLUCOSE 187* 188* 135* 144*  BUN 15 14 17 13   CREATININE 1.08 0.95 1.14* 0.95  CALCIUM 10.3 9.1 9.1 9.4  MG  --   --  1.6  --    Liver Function Tests:  Recent Labs Lab 05/04/14 0120 05/04/14 1047 05/05/14 0321  AST 15 13 12   ALT 8 7 6   ALKPHOS 68 62 64  BILITOT 1.3* 1.1 0.8  PROT 6.7 5.3* 5.0*  ALBUMIN 3.4* 2.6* 2.5*   No results for input(s): LIPASE, AMYLASE in the last 168 hours. No results for input(s): AMMONIA in the last 168 hours. CBC:  Recent Labs Lab 05/04/14 0120 05/04/14 1047 05/05/14 0321 05/06/14 0436  WBC 24.2* 22.1* 13.2* 9.1  NEUTROABS 22.3* 20.3*  --   --   HGB 12.7 10.6* 9.9* 9.7*  HCT 38.9 33.1* 31.9* 30.5*  MCV 86.4 87.8 88.9 87.4  PLT 274 221 208 198   Cardiac Enzymes:  Recent Labs Lab 05/04/14 1047 05/05/14 1305 05/05/14 1705  TROPONINI 0.04* 0.08* 0.06*   BNP: Invalid input(s): POCBNP CBG: No results for input(s): GLUCAP in the last 168 hours.  Recent Results (from the past 240 hour(s))  Blood culture (routine x 2)  Status: None (Preliminary result)   Collection Time: 05/04/14  2:14 AM  Result Value Ref Range Status   Specimen Description BLOOD LEFT HAND  Final   Special Requests BOTTLES DRAWN AEROBIC AND ANAEROBIC 5CC  Final   Culture   Final           BLOOD CULTURE RECEIVED NO GROWTH TO DATE CULTURE WILL BE HELD FOR 5 DAYS BEFORE ISSUING A FINAL NEGATIVE REPORT Performed at [facility]    Report Status PENDING  Incomplete  Blood culture (routine x 2)     Status: None (Preliminary result)   Collection Time: 05/04/14  2:17 AM  Result Value Ref Range Status   Specimen Description BLOOD RIGHT ARM  Final   Special Requests BOTTLES DRAWN AEROBIC ONLY 5CC  Final    Culture   Final           BLOOD CULTURE RECEIVED NO GROWTH TO DATE CULTURE WILL BE HELD FOR 5 DAYS BEFORE ISSUING A FINAL NEGATIVE REPORT Performed at [facility]    Report Status PENDING  Incomplete  Urine culture     Status: None   Collection Time: 05/04/14  2:36 AM  Result Value Ref Range Status   Specimen Description URINE, RANDOM  Final   Special Requests NONE  Final   Colony Count   Final    >=100,000 COLONIES/ML Performed at [facility]    Culture   Final    KLEBSIELLA OXYTOCA Performed at [facility]    Report Status 05/06/2014 FINAL  Final   Organism ID, Bacteria KLEBSIELLA OXYTOCA  Final      Susceptibility   Klebsiella oxytoca - MIC*    AMPICILLIN >=32 RESISTANT Resistant     CEFAZOLIN 8 SENSITIVE Sensitive     CEFTRIAXONE <=1 SENSITIVE Sensitive     CIPROFLOXACIN <=0.25 SENSITIVE Sensitive     GENTAMICIN <=1 SENSITIVE Sensitive     LEVOFLOXACIN <=0.12 SENSITIVE Sensitive     NITROFURANTOIN 32 SENSITIVE Sensitive     TOBRAMYCIN <=1 SENSITIVE Sensitive     TRIMETH/SULFA <=20 SENSITIVE Sensitive     PIP/TAZO <=4 SENSITIVE Sensitive     * KLEBSIELLA OXYTOCA     Scheduled Meds: . aspirin EC  81 mg Oral Daily  . atorvastatin  20 mg Oral q1800  . azithromycin  500 mg Intravenous Q24H  . budesonide (PULMICORT) nebulizer solution  0.25 mg Nebulization BID  . cefTRIAXone (ROCEPHIN)  IV  1 g Intravenous Q24H  . diltiazem  300 mg Oral Daily  . enoxaparin (LOVENOX) injection  40 mg Subcutaneous Q24H  . furosemide  40 mg Oral BID  . ipratropium  0.5 mg Nebulization Q6H  . levalbuterol  0.63 mg Nebulization QID  . metoprolol succinate  25 mg Oral Daily  . pantoprazole  40 mg Oral Daily   Continuous Infusions:    Arlie Posch, DO  [facility] Pager [phone]  If 7PM-7AM, please contact night-coverage [email or web address] Password [email or web address] 05/06/2014, 5:39 PM   LOS: 2 days

## 2014-05-07 LAB — BASIC METABOLIC PANEL
Anion gap: 10 (ref 5–15)
BUN: 8 mg/dL (ref 6–23)
CALCIUM: 9.2 mg/dL (ref 8.4–10.5)
CHLORIDE: 94 meq/L — AB (ref 96–112)
CO2: 30 mmol/L (ref 19–32)
Creatinine, Ser: 0.97 mg/dL (ref 0.50–1.10)
GFR calc Af Amer: 61 mL/min — ABNORMAL LOW (ref >90)
GFR calc non Af Amer: 53 mL/min — ABNORMAL LOW (ref >90)
Glucose, Bld: 129 mg/dL — ABNORMAL HIGH (ref 70–99)
POTASSIUM: 3.2 mmol/L — AB (ref 3.5–5.1)
Sodium: 134 mmol/L — ABNORMAL LOW (ref 135–145)

## 2014-05-07 LAB — MAGNESIUM: Magnesium: 1.5 mg/dL (ref 1.5–2.5)

## 2014-05-07 MED ORDER — POTASSIUM CHLORIDE CRYS ER 20 MEQ PO TBCR
40.0000 meq | EXTENDED_RELEASE_TABLET | Freq: Once | ORAL | Status: AC
  Administered 2014-05-07: 40 meq via ORAL
  Filled 2014-05-07: qty 2

## 2014-05-07 MED ORDER — AZITHROMYCIN 500 MG PO TABS
500.0000 mg | ORAL_TABLET | Freq: Every day | ORAL | Status: DC
  Administered 2014-05-08: 500 mg via ORAL
  Filled 2014-05-07: qty 1

## 2014-05-07 NOTE — Progress Notes (Signed)
Medicare Important Message given? YES  (If response is "NO", the following Medicare IM given date fields will be blank)  Date Medicare IM given: 05/07/14 Medicare IM given by:  Glori Machnik  

## 2014-05-07 NOTE — Clinical Social Work Placement (Addendum)
Clinical Social Work Department CLINICAL SOCIAL WORK PLACEMENT NOTE 05/07/2014  Patient:  [name],[name]  Account Number:  [id number] Admit date:  05/04/2014  Clinical Social Worker:  Merlyn LotJENNA HOLOMAN, CLINICAL SOCIAL WORKER  Date/time:  05/07/2014 05:41 PM  Clinical Social Work is seeking post-discharge placement for this patient at the following level of care:   SKILLED NURSING   (*CSW will update this form in Epic as items are completed)   05/07/2014  [facility]family provided with [facility] of Clinical Social Work'[name] list of facilities offering this level of care within the geographic area requested by the patient (or if unable, by the patient'[name] family).  05/07/2014  Patient/family informed of their freedom to choose among providers that offer the needed level of care, that participate in Medicare, Medicaid or managed care program needed by the patient, have an available bed and are willing to accept the patient.  05/07/2014  Patient/family informed of [facility]' ownership interest in [facility], as well as of the fact that they are under no obligation to receive care at this facility.  PASARR submitted to EDS on 05/07/2014 PASARR number received on 05/07/2014  FL2 transmitted to all facilities in geographic area requested by pt/family on  05/07/2014 FL2 transmitted to all facilities within larger geographic area on   Patient informed that his/her managed care company has contracts with or will negotiate with  certain facilities, including the following:     Patient/family informed of bed offers received:  05/08/14 [facility] recommends and patient chooses bed at  [facility]  Patient to be transferred to [facility] on  05/08/14 Patient to be transferred to facility by [facility] Patient and family notified of transfer on patient to inform family Name of family member notified:    The following physician request were entered in  Epic:   Additional Comments: Merlyn LotJenna Holoman, [facility] Clinical Social Worker [phone]

## 2014-05-07 NOTE — Progress Notes (Signed)
PROGRESS NOTE  [name] ZOX:[id number] DOB: Feb 27, 1930 DOA: 05/04/2014 PCP: Kaleen Mask, MD  Assessment/Plan: Sepsis -Present at the time of admission -Improved, afebrile and hemodynamically stable -Secondary to CAP and UTI -Influenza PCR negative -improved Community acquired pneumonia -Continue ceftriaxone and azithromycin -Follow up blood culture data--neg -Repeat x-ray 05/05/2014--"new" RLL consolidation UTI--Klebsiella - continueceftriaxone Chronic atrial fibrillation with RVR -Rate controlled -Not a candidate for anticoagulation secondary to GI bleed and AVMs -Continue metoprolol succinate -Continue diltiazem CD -07/19/12 Echo EF 55-60%  -continue aspirin -TSH is 0.892 Chronic respiratory failure/COPD -Patient is maintained on 2 L nasal cannula at home -No wheezing presently -Continue Pulmicort nebs CAD s/p CABG x5 2005 w/ BMS to SVG 2014 -pt c/o chest discomfort -continue ASA -Continue atorvastatin Elevated troponin -minimal -due to demand ischemia hypertension  -Controlled on diltiazem and metoprolol succinate    Family Communication: son updated at beside Disposition Plan: SNF on 05/08/14            Procedures/Studies: Ct Abdomen Pelvis Wo Contrast  05/04/2014   CLINICAL DATA:  Nausea. Unable acute down medications or foods. I am oxygen all the time. Atrial fibrillation. Tachycardia. Shortness of breath.  EXAM: CT ABDOMEN AND PELVIS WITHOUT CONTRAST  TECHNIQUE: Multidetector CT imaging of the abdomen and pelvis was performed following the standard protocol without IV contrast.  COMPARISON:  None.  FINDINGS: Patchy airspace disease in the posterior right lower lung suggest pneumonia.  Surgical absence of the gallbladder. All vascular calcifications throughout the abdominal aorta and branch vessels. Ectatic infrarenal abdominal aorta with maximal diameter of 2.6 cm. Appears to be a stent in the right renal artery origin. Tiny  sub cm low-attenuation lesion in the medial segment left lobe of the liver is too small to characterize. This is likely to represent a cyst or hemangioma. The unenhanced appearance of the pancreas, spleen, adrenal glands, kidneys, inferior vena cava, and retroperitoneal lymph nodes is unremarkable. Stomach and small bowel are decompressed. Diffusely stool-filled colon without abnormal distention. No free air or free fluid in the abdomen. Broad-based midline abdominal wall hernia containing transverse colon and small bowel without proximal obstruction.  Pelvis: The appendix is normal. Diverticulosis of sigmoid colon without evidence of diverticulitis. Bladder wall is not thickened. Uterus appears surgically absent. No pelvic mass or lymphadenopathy. No free or loculated pelvic fluid collections. Left hip arthroplasty with streak artifact limiting visualization of portions of the pelvis. Degenerative changes throughout the lumbar spine. No destructive bone lesions.  IMPRESSION: Airspace disease in the right lower lung suggesting pneumonia. No acute process demonstrated in the abdomen or pelvis. No evidence of bowel obstruction. Diverticulosis of the sigmoid colon without diverticulitis. Vascular calcifications.   Electronically Signed   By: Burman Nieves M.D.   On: 05/04/2014 04:36   Dg Chest Port 1 View  05/05/2014   CLINICAL DATA:  81 year old female with chest congestion, cough and increased oxygen requirement.  EXAM: PORTABLE CHEST - 1 VIEW  COMPARISON:  Chest x-ray 05/04/2014.  FINDINGS: New opacity in the right lung base, concerning for developing pneumonia (better demonstrated on recent CT examination 05/04/2014). Small right pleural effusion. Left lung is clear. No evidence of pulmonary edema. Heart size is mildly enlarged. Upper mediastinal contours are within normal limits. Atherosclerosis in the thoracic aorta. Status post median sternotomy for CABG.  IMPRESSION: 1. New airspace consolidation in the  right lower lobe, concerning for developing pneumonia. Small right pleural effusion. 2. Atherosclerosis. 3. Mild cardiomegaly.  Electronically Signed   By: Trudie Reed M.D.   On: 05/05/2014 15:24   Dg Chest Port 1 View  05/04/2014   CLINICAL DATA:  Shortness of breath and palpitations.  EXAM: PORTABLE CHEST - 1 VIEW  COMPARISON:  02/01/2014  FINDINGS: Postoperative changes in the mediastinum. Borderline heart size without vascular congestion. Emphysematous changes in the lungs with fibrosis in the apices and bases. No focal airspace disease or consolidation. No blunting of costophrenic angles. No pneumothorax. Calcification of aorta.  IMPRESSION: Emphysematous changes and chronic bronchitic changes in the lungs. No evidence of active pulmonary disease.   Electronically Signed   By: Burman Nieves M.D.   On: 05/04/2014 01:57         Subjective: Patient continues to complain of some shortness of breath but improving. She has dyspnea on exertion. Denies any fevers, chills, chest pain, nausea, vomiting, diarrhea, abdominal pain.   Objective: Filed Vitals:   05/07/14 1111 05/07/14 1335 05/07/14 1503 05/07/14 1527  BP:   129/59   Pulse:   89   Temp:   98.3 F (36.8 C)   TempSrc:   Oral   Resp:   18   Height:      Weight:      SpO2: 97% 94% 95% 95%    Intake/Output Summary (Last 24 hours) at 05/07/14 1905 Last data filed at 05/07/14 1833  Gross per 24 hour  Intake    360 ml  Output    650 ml  Net   -290 ml   Weight change: -0.2 kg (-7.1 oz) Exam:   General:  Pt is alert, follows commands appropriately, not in acute distress  HEENT: No icterus, No thrush,  [place]/AT  Cardiovascular: RRR, S1/S2, no rubs, no gallops  Respiratory: Bibasilar crackles, right greater than left. No wheezing.   Abdomen: Soft/+BS, non tender, non distended, no guarding  Extremities: trace LE edema, No lymphangitis, No petechiae, No rashes, no synovitis  Data Reviewed: Basic Metabolic  Panel:  Recent Labs Lab 05/04/14 0120 05/04/14 1047 05/05/14 0321 05/06/14 0436 05/07/14 0347  NA 134* 133* 135 134* 134*  K 3.1* 3.4* 3.7 3.3* 3.2*  CL 90* 95* 97 96 94*  CO2 33* 32 GLUCOSE 187* 188* 135* 144* 129*  BUN CREATININE 1.08 0.95 1.14* 0.95 0.97  CALCIUM 10.3 9.1 9.1 9.4 9.2  MG  --   --  1.6  --  1.5   Liver Function Tests:  Recent Labs Lab 05/04/14 0120 05/04/14 1047 05/05/14 0321  AST ALT ALKPHOS 68 62 64  BILITOT 1.3* 1.1 0.8  PROT 6.7 5.3* 5.0*  ALBUMIN 3.4* 2.6* 2.5*   No results for input(s): LIPASE, AMYLASE in the last 168 hours. No results for input(s): AMMONIA in the last 168 hours. CBC:  Recent Labs Lab 05/04/14 0120 05/04/14 1047 05/05/14 0321 05/06/14 0436  WBC 24.2* 22.1* 13.2* 9.1  NEUTROABS 22.3* 20.3*  --   --   HGB 12.7 10.6* 9.9* 9.7*  HCT 38.9 33.1* 31.9* 30.5*  MCV 86.4 87.8 88.9 87.4  PLT 274 221 208 198   Cardiac Enzymes:  Recent Labs Lab 05/04/14 1047 05/05/14 1305 05/05/14 1705  TROPONINI 0.04* 0.08* 0.06*   BNP: Invalid input(s): POCBNP CBG: No results for input(s): GLUCAP in the last 168 hours.  Recent Results (from the past 240 hour(s))  Blood culture (routine x 2)     Status:  None (Preliminary result)   Collection Time: 05/04/14  2:14 AM  Result Value Ref Range Status   Specimen Description BLOOD LEFT HAND  Final   Special Requests BOTTLES DRAWN AEROBIC AND ANAEROBIC 5CC  Final   Culture   Final           BLOOD CULTURE RECEIVED NO GROWTH TO DATE CULTURE WILL BE HELD FOR 5 DAYS BEFORE ISSUING A FINAL NEGATIVE REPORT [facility]    Report Status PENDING  Incomplete  Blood culture (routine x 2)     Status: None (Preliminary result)   Collection Time: 05/04/14  2:17 AM  Result Value Ref Range Status   Specimen Description BLOOD RIGHT ARM  Final   Special Requests BOTTLES DRAWN AEROBIC ONLY 5CC  Final   Culture   Final           BLOOD  CULTURE RECEIVED NO GROWTH TO DATE CULTURE WILL BE HELD FOR 5 DAYS BEFORE ISSUING A FINAL NEGATIVE REPORT [facility]    Report Status PENDING  Incomplete  Urine culture     Status: None   Collection Time: 05/04/14  2:36 AM  Result Value Ref Range Status   Specimen Description URINE, RANDOM  Final   Special Requests NONE  Final   Colony Count   Final    >=100,000 COLONIES/ML [facility]    Culture   Final    KLEBSIELLA OXYTOCA [facility] at [facility]    Report Status 05/06/2014 FINAL  Final   Organism ID, Bacteria KLEBSIELLA OXYTOCA  Final      Susceptibility   Klebsiella oxytoca - MIC*    AMPICILLIN >=32 RESISTANT Resistant     CEFAZOLIN 8 SENSITIVE Sensitive     CEFTRIAXONE <=1 SENSITIVE Sensitive     CIPROFLOXACIN <=0.25 SENSITIVE Sensitive     GENTAMICIN <=1 SENSITIVE Sensitive     LEVOFLOXACIN <=0.12 SENSITIVE Sensitive     NITROFURANTOIN 32 SENSITIVE Sensitive     TOBRAMYCIN <=1 SENSITIVE Sensitive     TRIMETH/SULFA <=20 SENSITIVE Sensitive     PIP/TAZO <=4 SENSITIVE Sensitive     * KLEBSIELLA OXYTOCA  Gram stain     Status: None   Collection Time: 05/06/14  7:02 PM  Result Value Ref Range Status   Specimen Description SPUTUM  Final   Special Requests NONE  Final   Gram Stain   Final    FEW WBC PRESENT,BOTH PMN AND MONONUCLEAR RARE YEAST    Report Status 05/06/2014 FINAL  Final  Culture, respiratory (NON-Expectorated)     Status: None (Preliminary result)   Collection Time: 05/06/14  7:02 PM  Result Value Ref Range Status   Specimen Description SPUTUM  Final   Special Requests NONE  Final   Gram Stain PENDING  Incomplete   Culture NO GROWTH [facility] at [facility]   Final   Report Status PENDING  Incomplete  Culture, expectorated sputum-assessment     Status: None   Collection Time: 05/06/14  7:03 PM  Result Value Ref Range Status   Specimen Description SPUTUM  Final   Special Requests NONE   Final   Sputum evaluation   Final    THIS SPECIMEN IS ACCEPTABLE. RESPIRATORY CULTURE REPORT TO FOLLOW.   Report Status 05/06/2014 FINAL  Final     Scheduled Meds: . aspirin EC  81 mg Oral Daily  . atorvastatin  20 mg Oral q1800  . [START ON 05/08/2014] azithromycin  500  mg Oral Daily  . budesonide (PULMICORT) nebulizer solution  0.25 mg Nebulization BID  . cefTRIAXone (ROCEPHIN)  IV  1 g Intravenous Q24H  . diltiazem  300 mg Oral Daily  . enoxaparin (LOVENOX) injection  40 mg Subcutaneous Q24H  . furosemide  40 mg Oral BID  . ipratropium  0.5 mg Nebulization Q6H  . levalbuterol  0.63 mg Nebulization QID  . metoprolol succinate  25 mg Oral Daily  . pantoprazole  40 mg Oral Daily   Continuous Infusions:    Francie Keeling, DO  [facility] Pager [phone]  If 7PM-7AM, please contact night-coverage [email or web address] Password [email or web address] 05/07/2014, 7:05 PM   LOS: 3 days

## 2014-05-07 NOTE — Clinical Social Work Psychosocial (Signed)
Clinical Social Work Department BRIEF PSYCHOSOCIAL ASSESSMENT 05/07/2014  Patient:  [name],[name]     Account Number:  [id number]     Admit date:  05/04/2014  Clinical Social Worker:  Merlyn LotHOLOMAN,Zacharias Ridling, CLINICAL SOCIAL WORKER  Date/Time:  05/07/2014 05:37 PM  Referred by:  Physician  Date Referred:  05/07/2014 Referred for  SNF Placement   Other Referral:   Interview type:  Patient Other interview type:    PSYCHOSOCIAL DATA Living Status:  FAMILY Admitted from facility:   Level of care:   Primary support name:  [name] Primary support relationship to patient:  CHILD, ADULT Degree of support available:   high level of support- patient also lives with her grandson who is able to help some during the day but sleeps at the patients house    CURRENT CONCERNS Current Concerns  Post-Acute Placement   Other Concerns:    SOCIAL WORK ASSESSMENT / PLAN CSW spoke with patient concerning SNF placement.  Patient is agreeable to placement at [facility] nursing home where she has been in the past.  CSW will continue to follow.   Assessment/plan status:  Psychosocial Support/Ongoing Assessment of Needs Other assessment/ plan:   FL2 update   Information/referral to community resources:    PATIENT'[name]/FAMILY'[name] RESPONSE TO PLAN OF CARE: Patient is agreeable to SNF placement if it is at [facility] nursing home- patient is hopeful that [facility] with have availability and that patient will be able to get well soon.       Merlyn LotJenna Holoman, LCSWA Clinical Social Worker [phone]

## 2014-05-07 NOTE — Progress Notes (Signed)
Utilization Review Completed.Shante Maysonet T1/02/2015  

## 2014-05-08 LAB — LEGIONELLA ANTIGEN, URINE

## 2014-05-08 LAB — BASIC METABOLIC PANEL
Anion gap: 8 (ref 5–15)
BUN: 6 mg/dL (ref 6–23)
CALCIUM: 8.9 mg/dL (ref 8.4–10.5)
CHLORIDE: 92 meq/L — AB (ref 96–112)
CO2: 30 mmol/L (ref 19–32)
Creatinine, Ser: 0.87 mg/dL (ref 0.50–1.10)
GFR calc Af Amer: 70 mL/min — ABNORMAL LOW (ref >90)
GFR calc non Af Amer: 60 mL/min — ABNORMAL LOW (ref >90)
GLUCOSE: 125 mg/dL — AB (ref 70–99)
POTASSIUM: 3.5 mmol/L (ref 3.5–5.1)
SODIUM: 130 mmol/L — AB (ref 135–145)

## 2014-05-08 LAB — CULTURE, RESPIRATORY

## 2014-05-08 LAB — CULTURE, RESPIRATORY W GRAM STAIN

## 2014-05-08 LAB — MAGNESIUM: MAGNESIUM: 1.4 mg/dL — AB (ref 1.5–2.5)

## 2014-05-08 MED ORDER — CEFDINIR 300 MG PO CAPS: 300.0000 mg | ORAL_CAPSULE | Freq: Two times a day (BID) | ORAL | Status: DC

## 2014-05-08 MED ORDER — AZITHROMYCIN 500 MG PO TABS: 500.0000 mg | ORAL_TABLET | Freq: Every day | ORAL | Status: DC

## 2014-05-08 MED ORDER — MAGNESIUM SULFATE 2 GM/50ML IV SOLN
2.0000 g | Freq: Once | INTRAVENOUS | Status: AC
  Administered 2014-05-08: 2 g via INTRAVENOUS
  Filled 2014-05-08: qty 50

## 2014-05-08 MED ORDER — MAGNESIUM SULFATE 2 GM/50ML IV SOLN
2.0000 g | Freq: Once | INTRAVENOUS | Status: DC
  Filled 2014-05-08: qty 50

## 2014-05-08 MED ORDER — POTASSIUM CHLORIDE CRYS ER 20 MEQ PO TBCR
40.0000 meq | EXTENDED_RELEASE_TABLET | Freq: Once | ORAL | Status: AC
  Administered 2014-05-08: 40 meq via ORAL
  Filled 2014-05-08: qty 2

## 2014-05-08 NOTE — Clinical Social Work Note (Signed)
Patient will discharge to [facility] Anticipated discharge date:05/08/14 Family notified: patient to notify family Transportation by [facility]- scheduled for 4pm when patient is done with magnesium drip- RN aware  CSW signing off.  Merlyn LotJenna Holoman, LCSWA Clinical Social Worker [phone]

## 2014-05-08 NOTE — Progress Notes (Signed)
Pt alert and oriented, d/c ordered, d/c instructions given and pt educated. IV d/c and ems picked pt up. [facility]. Hiram Combererek Ferry Matthis RN

## 2014-05-08 NOTE — Care Management Note (Signed)
    Page 1 of 1   05/08/2014     5:01:29 PM CARE MANAGEMENT NOTE 05/08/2014  Patient:  [name],[name]   Account Number:  [id number]  Date Initiated:  05/07/2014  Documentation initiated by:  Donn PieriniWEBSTER,Maytte Jacot  Subjective/Objective Assessment:   Pt admitted with sepsis/PNA     Action/Plan:   PTA pt lived at home- [facility] also has [facility] and walker-- NCM to follow for d/c needs   Anticipated DC Date:  05/09/2014   Anticipated DC Plan:  HOME/SELF CARE         Choice offered to / List presented to:             Status of service:  Completed, signed off Medicare Important Message given?  YES (If response is "NO", the following Medicare IM given date fields will be blank) Date Medicare IM given:  05/07/2014 Medicare IM given by:  Donn PieriniWEBSTER,Elder Davidian Date Additional Medicare IM given:   Additional Medicare IM given by:    Discharge Disposition:  HOME/SELF CARE  Per UR Regulation:  Reviewed for med. necessity/level of care/duration of stay  If discussed at Long Length of Stay Meetings, dates discussed:    Comments:

## 2014-05-08 NOTE — Evaluation (Signed)
Physical Therapy Evaluation Patient Details Name: [name] MRN: [id number] DOB: Sep 29, 1929 Today's Date: 05/08/2014   History of Present Illness   [name] is a 81 y.o. female with history of COPD, chronic respiratory failure, chronic atrial fibrillation not on Coumadin secondary to GI bleed/AVMs, CAD presents to the ER because of nausea and weakness with subjective feeling of fever and chills. Pt with sepsis with UTI and PNA  Clinical Impression  Pt pleasant but easily fatigued lady who lives with grandson that works and does not have 24hr assist at home. Pt unable to ambulate today due to fatigue but was able to complete pericare with set up. Pt educated for HEP but unable to perform also due to fatigue. Pt will benefit from acute therapy to maximize mobility, function, gait and activity tolerance to decrease burden of care and return pt to PLOF.     Follow Up Recommendations SNF;Supervision/Assistance - 24 hour    Equipment Recommendations  None recommended by PT    Recommendations for Other Services OT consult     Precautions / Restrictions        Mobility  Bed Mobility Overal bed mobility: Modified Independent             General bed mobility comments: with rail and HOB 20 degrees  Transfers Overall transfer level: Needs assistance   Transfers: Sit to/from Stand;Stand Pivot Transfers Sit to Stand: Supervision Stand pivot transfers: Supervision       General transfer comment: cues for hand placement with assist to manage lines  Ambulation/Gait Ambulation/Gait assistance:  (pt unable secondary to fatigue)              Stairs            Wheelchair Mobility    Modified Rankin (Stroke Patients Only)       Balance Overall balance assessment: Needs assistance   Sitting balance-Leahy Scale: Good       Standing balance-Leahy Scale: Poor                               Pertinent Vitals/Pain Pain Assessment: No/denies  pain  On 2.5 L at rest 91% 3L with limited tranfers dropped to 88%    Home Living Family/patient expects to be discharged to:: Private residence Living Arrangements: Other relatives Available Help at Discharge: Available PRN/intermittently Type of Home: House Home Access: Ramped entrance     Home Layout: One level Home Equipment: Cane - single point;Hospital bed;Walker - 2 wheels;Walker - standard;Walker - 4 wheels;Bedside commode;Shower seat;Grab bars - tub/shower;Other (comment) Additional Comments: daughter in law does pt errands and cleaning.      Prior Function Level of Independence: Independent with assistive device(s)         Comments: amb with rollator, states she sponge bathes and dresses on her own     Hand Dominance        Extremity/Trunk Assessment   Upper Extremity Assessment: Generalized weakness           Lower Extremity Assessment: Generalized weakness;RLE deficits/detail;LLE deficits/detail RLE Deficits / Details: 3/5 hip flexion, knee flexion and extension bil LE LLE Deficits / Details: 3/5 hip flexion, knee flexion and extension bil LE  Cervical / Trunk Assessment: Kyphotic  Communication   Communication: No difficulties  Cognition Arousal/Alertness: Awake/alert Behavior During Therapy: WFL for tasks assessed/performed Overall Cognitive Status: Within Functional Limits for tasks assessed  General Comments      Exercises        Assessment/Plan    PT Assessment Patient needs continued PT services  PT Diagnosis Generalized weakness;Difficulty walking   PT Problem List Decreased strength;Decreased activity tolerance;Decreased mobility;Decreased safety awareness;Decreased knowledge of use of DME  PT Treatment Interventions Gait training;DME instruction;Functional mobility training;Therapeutic activities;Therapeutic exercise;Patient/family education   PT Goals (Current goals can be found in the Care Plan  section) Acute Rehab PT Goals Patient Stated Goal: go to rehab then home PT Goal Formulation: With patient Time For Goal Achievement: 05/22/14 Potential to Achieve Goals: Fair    Frequency Min 3X/week   Barriers to discharge Decreased caregiver support      Co-evaluation               End of Session   Activity Tolerance: Patient limited by fatigue Patient left: in chair;with call bell/phone within reach Nurse Communication: Mobility status         Time: [id number] PT Time Calculation (min) (ACUTE ONLY): 22 min   Charges:   PT Evaluation $Initial PT Evaluation Tier I: 1 Procedure PT Treatments $Therapeutic Activity: 8-22 mins   PT G CodesDelorse Lek:        Tabor, Uday Jantz Beth 05/08/2014, 1:17 PM Delaney MeigsMaija Tabor Cherron Blitzer, PT [phone]

## 2014-05-08 NOTE — Progress Notes (Signed)
RT Note: Patient was found this AM on room air. Her oxygen was not connected to the wall. She was saturating 85% and had increased WOB and SOB. Patient was placed on her nebulizer treatment with oxygen and her Spo2 came up to 94% and increased WOB and SOB was resolved. Rt will place patient back on her nasal cannula post neb and monitor her Spo2. Rt will continue to monitor.

## 2014-05-08 NOTE — Discharge Summary (Signed)
Physician Discharge Summary  [name]:[id number] DOB: June 10, 1929 DOA: 05/04/2014  PCP: Kaleen Mask, MD  Admit date: 05/04/2014 Discharge date: 05/08/2014  Recommendations for Outpatient Follow-up:  1. Pt will need to follow up with PCP in 2 weeks post discharge 2. Please obtain BMP and CBC in one week 3. Please keep patient on 2 L nasal cannula  Discharge Diagnoses:  Sepsis -Present at the time of admission -Improved, afebrile and hemodynamically stable -Secondary to CAP and UTI -Influenza PCR negative -improved--patient remains afebrile and hemodynamically stable Community acquired pneumonia -Continued ceftriaxone and azithromycin -Patient will be discharged with Omnicef and azithromycin for 3 additional days which will finish 1 week of therapy -Follow up blood culture data--neg -Repeat x-ray 05/05/2014--"new" RLL consolidation UTI--Klebsiella - continue ceftriaxone--d/c with omnicef x 3 additional days Chronic atrial fibrillation with RVR -Rate controlled -Not a candidate for anticoagulation secondary to GI bleed and AVMs -Continue metoprolol succinate -Continue diltiazem CD -07/19/12 Echo EF 55-60%  -continue aspirin -TSH is 0.892 -Patient's blood pressure remained stable off on losartan--will not restart after discharge Chronic respiratory failure/COPD -Patient is maintained on 2 L nasal cannula at home--presently stable on 2 L -No wheezing presently -Continued Pulmicort nebs -Restart long-acting beta agonist- CAD s/p CABG x5 2005 w/ BMS to SVG 2014 -pt without c/o chest discomfort -continue ASA -Continue atorvastatin Elevated troponin -minimal -due to demand ischemia -no chest pain hypertension  -Controlled on diltiazem and metoprolol succinate   Discharge Condition: stable  Disposition: SNF--[facility]  Diet:heart healthy Wt Readings from Last 3 Encounters:  05/08/14 86.8 kg (191 lb 5.8 oz)  03/26/14 84.369 kg (186 lb)  02/04/14 89 kg  (196 lb 3.4 oz)    History of present illness:  81 y.o. female with history of COPD, chronic atrial fibrillation not on Coumadin secondary to GI bleed/AVMs, and CAD who presented to the ER w/ c/o nausea and weakness with subjective fever and chills over 2 days.   In the ER patient was found to be in atrial fibrillation with RVR and patient was febrile with labs showing leukocytosis. Patient was started on Cardizem infusion for atrial fibrillation with RVR. The patient was started on intravenous antibiotics and fluid hydration. The patient's heart rate improved and was controlled for the rest of the hospitalization. The patient was continued on aerosolized albuterol and Atrovent. Her respiratory status improved. She remained stable on 2 L nasal cannula. The patient will be discharged with [facility] and azithromycin for 3 additional days which will complete 7 days of therapy. The patient's leukocytosis improved and was 9.1 at time of d/c.  Blood cultures were negative. Urine culture grew Klebsiella sensitive to ceftriaxone. Physical therapy was consulted. The patient participated and they recommended skilled nursing facility.      Discharge Exam: Filed Vitals:   05/08/14 0618  BP: 138/65  Pulse: 108  Temp: 98.2 F (36.8 C)  Resp: 20   Filed Vitals:   05/08/14 0242 05/08/14 0618 05/08/14 0807 05/08/14 0815  BP:  138/65    Pulse: 102 108    Temp:  98.2 F (36.8 C)    TempSrc:  Oral    Resp: 20 20    Height:      Weight:  86.8 kg (191 lb 5.8 oz)    SpO2: 94% 90% 85% 97%   General: Awake and alert, NAD, pleasant, cooperative Cardiovascular: IRRR, no rub, no gallop, no S3 Respiratory: Bibasilar rales, right greater than left. No wheezing. Good air movement.  Abdomen:soft, nontender, nondistended, positive  bowel sounds Extremities: 1+LE edema, No lymphangitis, no petechiae  Discharge Instructions      Discharge Instructions    Diet - low sodium heart healthy    Complete by:  As  directed      Increase activity slowly    Complete by:  As directed             Medication List    STOP taking these medications        losartan 50 MG tablet  Commonly known as:  COZAAR      TAKE these medications        albuterol 108 (90 BASE) MCG/ACT inhaler  Commonly known as:  PROVENTIL HFA;VENTOLIN HFA  Inhale 2 puffs into the lungs every 6 (six) hours as needed for wheezing or shortness of breath.     albuterol (2.5 MG/3ML) 0.083% nebulizer solution  Commonly known as:  PROVENTIL  Take 2.5 mg by nebulization every 6 (six) hours as needed for wheezing.     aspirin EC 81 MG tablet  Take 81 mg by mouth daily.     atorvastatin 40 MG tablet  Commonly known as:  LIPITOR  Take 0.5 tablets (20 mg total) by mouth daily at 6 PM.     azithromycin 500 MG tablet  Commonly known as:  ZITHROMAX  Take 1 tablet (500 mg total) by mouth daily. Start 05/09/14  Start taking on:  05/09/2014     budesonide-formoterol 160-4.5 MCG/ACT inhaler  Commonly known as:  SYMBICORT  Inhale 2 puffs into the lungs 2 (two) times daily.     cefdinir 300 MG capsule  Commonly known as:  OMNICEF  Take 1 capsule (300 mg total) by mouth 2 (two) times daily. Start 05/09/14  Start taking on:  05/09/2014     diltiazem 300 MG 24 hr capsule  Commonly known as:  CARDIZEM CD  Take 1 capsule (300 mg total) by mouth daily.     furosemide 40 MG tablet  Commonly known as:  LASIX  Take 1 tablet (40 mg total) by mouth 2 (two) times daily.     metoprolol succinate 25 MG 24 hr tablet  Commonly known as:  TOPROL-XL  Take 1 tablet (25 mg total) by mouth daily.     nitroGLYCERIN 0.4 MG SL tablet  Commonly known as:  NITROSTAT  Place 0.4 mg under the tongue every 5 (five) minutes as needed for chest pain.     pantoprazole 40 MG tablet  Commonly known as:  PROTONIX  Take 40 mg by mouth daily.     potassium chloride SA 20 MEQ tablet  Commonly known as:  K-DUR,KLOR-CON  Take 20 mEq by mouth 2 (two) times  daily.     PRESERVISION AREDS 2 PO  Take 1 tablet by mouth 2 (two) times daily.     tiotropium 18 MCG inhalation capsule  Commonly known as:  SPIRIVA  Place 1 capsule (18 mcg total) into inhaler and inhale daily.     Vitamin D (Ergocalciferol) 50000 UNITS Caps capsule  Commonly known as:  DRISDOL  Take 50,000 Units by mouth every 14 (fourteen) days. Every other Sunday         The results of significant diagnostics from this hospitalization (including imaging, microbiology, ancillary and laboratory) are listed below for reference.    Significant Diagnostic Studies: Ct Abdomen Pelvis Wo Contrast  05/04/2014   CLINICAL DATA:  Nausea. Unable acute down medications or foods. I am oxygen all the time. Atrial fibrillation. Tachycardia.  Shortness of breath.  EXAM: CT ABDOMEN AND PELVIS WITHOUT CONTRAST  TECHNIQUE: Multidetector CT imaging of the abdomen and pelvis was performed following the standard protocol without IV contrast.  COMPARISON:  None.  FINDINGS: Patchy airspace disease in the posterior right lower lung suggest pneumonia.  Surgical absence of the gallbladder. All vascular calcifications throughout the abdominal aorta and branch vessels. Ectatic infrarenal abdominal aorta with maximal diameter of 2.6 cm. Appears to be a stent in the right renal artery origin. Tiny sub cm low-attenuation lesion in the medial segment left lobe of the liver is too small to characterize. This is likely to represent a cyst or hemangioma. The unenhanced appearance of the pancreas, spleen, adrenal glands, kidneys, inferior vena cava, and retroperitoneal lymph nodes is unremarkable. Stomach and small bowel are decompressed. Diffusely stool-filled colon without abnormal distention. No free air or free fluid in the abdomen. Broad-based midline abdominal wall hernia containing transverse colon and small bowel without proximal obstruction.  Pelvis: The appendix is normal. Diverticulosis of sigmoid colon without evidence  of diverticulitis. Bladder wall is not thickened. Uterus appears surgically absent. No pelvic mass or lymphadenopathy. No free or loculated pelvic fluid collections. Left hip arthroplasty with streak artifact limiting visualization of portions of the pelvis. Degenerative changes throughout the lumbar spine. No destructive bone lesions.  IMPRESSION: Airspace disease in the right lower lung suggesting pneumonia. No acute process demonstrated in the abdomen or pelvis. No evidence of bowel obstruction. Diverticulosis of the sigmoid colon without diverticulitis. Vascular calcifications.   Electronically Signed   By: Burman NievesWilliam  Stevens M.D.   On: 05/04/2014 04:36   Dg Chest Port 1 View  05/05/2014   CLINICAL DATA:  81 year old female with chest congestion, cough and increased oxygen requirement.  EXAM: PORTABLE CHEST - 1 VIEW  COMPARISON:  Chest x-ray 05/04/2014.  FINDINGS: New opacity in the right lung base, concerning for developing pneumonia (better demonstrated on recent CT examination 05/04/2014). Small right pleural effusion. Left lung is clear. No evidence of pulmonary edema. Heart size is mildly enlarged. Upper mediastinal contours are within normal limits. Atherosclerosis in the thoracic aorta. Status post median sternotomy for CABG.  IMPRESSION: 1. New airspace consolidation in the right lower lobe, concerning for developing pneumonia. Small right pleural effusion. 2. Atherosclerosis. 3. Mild cardiomegaly.   Electronically Signed   By: Trudie Reedaniel  Entrikin M.D.   On: 05/05/2014 15:24   Dg Chest Port 1 View  05/04/2014   CLINICAL DATA:  Shortness of breath and palpitations.  EXAM: PORTABLE CHEST - 1 VIEW  COMPARISON:  02/01/2014  FINDINGS: Postoperative changes in the mediastinum. Borderline heart size without vascular congestion. Emphysematous changes in the lungs with fibrosis in the apices and bases. No focal airspace disease or consolidation. No blunting of costophrenic angles. No pneumothorax. Calcification of  aorta.  IMPRESSION: Emphysematous changes and chronic bronchitic changes in the lungs. No evidence of active pulmonary disease.   Electronically Signed   By: Burman NievesWilliam  Stevens M.D.   On: 05/04/2014 01:57     Microbiology: Recent Results (from the past 240 hour(s))  Blood culture (routine x 2)     Status: None (Preliminary result)   Collection Time: 05/04/14  2:14 AM  Result Value Ref Range Status   Specimen Description BLOOD LEFT HAND  Final   Special Requests BOTTLES DRAWN AEROBIC AND ANAEROBIC 5CC  Final   Culture   Final           BLOOD CULTURE RECEIVED NO GROWTH TO DATE CULTURE WILL BE  HELD FOR 5 DAYS BEFORE ISSUING A FINAL NEGATIVE REPORT Performed at [facility]    Report Status PENDING  Incomplete  Blood culture (routine x 2)     Status: None (Preliminary result)   Collection Time: 05/04/14  2:17 AM  Result Value Ref Range Status   Specimen Description BLOOD RIGHT ARM  Final   Special Requests BOTTLES DRAWN AEROBIC ONLY 5CC  Final   Culture   Final           BLOOD CULTURE RECEIVED NO GROWTH TO DATE CULTURE WILL BE HELD FOR 5 DAYS BEFORE ISSUING A FINAL NEGATIVE REPORT Performed at [facility]    Report Status PENDING  Incomplete  Urine culture     Status: None   Collection Time: 05/04/14  2:36 AM  Result Value Ref Range Status   Specimen Description URINE, RANDOM  Final   Special Requests NONE  Final   Colony Count   Final    >=100,000 COLONIES/ML Performed at [facility]    Culture   Final    KLEBSIELLA OXYTOCA Performed at [facility]    Report Status 05/06/2014 FINAL  Final   Organism ID, Bacteria KLEBSIELLA OXYTOCA  Final      Susceptibility   Klebsiella oxytoca - MIC*    AMPICILLIN >=32 RESISTANT Resistant     CEFAZOLIN 8 SENSITIVE Sensitive     CEFTRIAXONE <=1 SENSITIVE Sensitive     CIPROFLOXACIN <=0.25 SENSITIVE Sensitive     GENTAMICIN <=1 SENSITIVE Sensitive     LEVOFLOXACIN <=0.12 SENSITIVE Sensitive      NITROFURANTOIN 32 SENSITIVE Sensitive     TOBRAMYCIN <=1 SENSITIVE Sensitive     TRIMETH/SULFA <=20 SENSITIVE Sensitive     PIP/TAZO <=4 SENSITIVE Sensitive     * KLEBSIELLA OXYTOCA  Gram stain     Status: None   Collection Time: 05/06/14  7:02 PM  Result Value Ref Range Status   Specimen Description SPUTUM  Final   Special Requests NONE  Final   Gram Stain   Final    FEW WBC PRESENT,BOTH PMN AND MONONUCLEAR RARE YEAST    Report Status 05/06/2014 FINAL  Final  Culture, respiratory (NON-Expectorated)     Status: None   Collection Time: 05/06/14  7:02 PM  Result Value Ref Range Status   Specimen Description SPUTUM  Final   Special Requests NONE  Final   Gram Stain   Final    FEW WBC PRESENT,BOTH PMN AND MONONUCLEAR RARE SQUAMOUS EPITHELIAL CELLS PRESENT RARE YEAST Performed at [facility] Performed at [facility]    Culture   Final    FEW CANDIDA ALBICANS Performed at [facility]    Report Status 05/08/2014 FINAL  Final  Culture, expectorated sputum-assessment     Status: None   Collection Time: 05/06/14  7:03 PM  Result Value Ref Range Status   Specimen Description SPUTUM  Final   Special Requests NONE  Final   Sputum evaluation   Final    THIS SPECIMEN IS ACCEPTABLE. RESPIRATORY CULTURE REPORT TO FOLLOW.   Report Status 05/06/2014 FINAL  Final     Labs: Basic Metabolic Panel:  Recent Labs Lab 05/04/14 1047 05/05/14 0321 05/06/14 0436 05/07/14 0347 05/08/14 0551  NA 133* 135 134* 134* 130*  K 3.4* 3.7 3.3* 3.2* 3.5  CL 95* 97 96 94* 92*  CO2 32 30 31 30 30   GLUCOSE 188* 135* 144* 129* 125*  BUN 14 17 13 8  6  CREATININE 0.95 1.14* 0.95 0.97 0.87  CALCIUM 9.1 9.1 9.4 9.2 8.9  MG  --  1.6  --  1.5 1.4*   Liver Function Tests:  Recent Labs Lab 05/04/14 0120 05/04/14 1047 05/05/14 0321  AST 15 13 12   ALT 8 7 6   ALKPHOS 68 62 64  BILITOT 1.3* 1.1 0.8  PROT 6.7 5.3* 5.0*  ALBUMIN 3.4* 2.6* 2.5*   No results for input(s):  LIPASE, AMYLASE in the last 168 hours. No results for input(s): AMMONIA in the last 168 hours. CBC:  Recent Labs Lab 05/04/14 0120 05/04/14 1047 05/05/14 0321 05/06/14 0436  WBC 24.2* 22.1* 13.2* 9.1  NEUTROABS 22.3* 20.3*  --   --   HGB 12.7 10.6* 9.9* 9.7*  HCT 38.9 33.1* 31.9* 30.5*  MCV 86.4 87.8 88.9 87.4  PLT 274 221 208 198   Cardiac Enzymes:  Recent Labs Lab 05/04/14 1047 05/05/14 1305 05/05/14 1705  TROPONINI 0.04* 0.08* 0.06*   BNP: Invalid input(s): POCBNP CBG: No results for input(s): GLUCAP in the last 168 hours.  Time coordinating discharge:  Greater than 30 minutes  Signed:  Tabatha Razzano, DO [facility] Pager: [phone] 05/08/2014, 2:16 PM

## 2014-05-10 LAB — CULTURE, BLOOD (ROUTINE X 2)
CULTURE: NO GROWTH
Culture: NO GROWTH

## 2014-06-06 ENCOUNTER — Ambulatory Visit: Payer: Medicare Other | Admitting: Adult Health

## 2014-06-07 ENCOUNTER — Ambulatory Visit: Payer: Medicare Other | Admitting: Adult Health

## 2014-06-11 ENCOUNTER — Other Ambulatory Visit: Payer: Self-pay | Admitting: Pulmonary Disease

## 2014-06-12 ENCOUNTER — Other Ambulatory Visit: Payer: Self-pay | Admitting: Pulmonary Disease

## 2014-06-14 ENCOUNTER — Other Ambulatory Visit: Payer: Self-pay | Admitting: *Deleted

## 2014-06-14 MED ORDER — BUDESONIDE-FORMOTEROL FUMARATE 160-4.5 MCG/ACT IN AERO: 2.0000 | INHALATION_SPRAY | Freq: Two times a day (BID) | RESPIRATORY_TRACT | Status: DC

## 2014-07-16 ENCOUNTER — Encounter: Payer: Self-pay | Admitting: Adult Health

## 2014-07-16 ENCOUNTER — Ambulatory Visit: Payer: Medicare Other | Admitting: Adult Health

## 2014-07-16 ENCOUNTER — Ambulatory Visit (INDEPENDENT_AMBULATORY_CARE_PROVIDER_SITE_OTHER)
Admission: RE | Admit: 2014-07-16 | Discharge: 2014-07-16 | Disposition: A | Discharge status: Home / Self Care | Payer: Medicare Other | Source: Ambulatory Visit | Attending: Adult Health | Admitting: Adult Health

## 2014-07-16 ENCOUNTER — Ambulatory Visit (INDEPENDENT_AMBULATORY_CARE_PROVIDER_SITE_OTHER): Payer: Medicare Other | Admitting: Adult Health

## 2014-07-16 VITALS — BP 120/82 | HR 87 | Ht 72.0 in | Wt 185.0 lb

## 2014-07-16 DIAGNOSIS — J449 Chronic obstructive pulmonary disease, unspecified: Secondary | ICD-10-CM | POA: Diagnosis not present

## 2014-07-16 DIAGNOSIS — J189 Pneumonia, unspecified organism: Secondary | ICD-10-CM | POA: Diagnosis not present

## 2014-07-16 DIAGNOSIS — J9611 Chronic respiratory failure with hypoxia: Secondary | ICD-10-CM

## 2014-07-16 MED ORDER — ALBUTEROL SULFATE HFA 108 (90 BASE) MCG/ACT IN AERS: 2.0000 | INHALATION_SPRAY | RESPIRATORY_TRACT | Status: AC | PRN

## 2014-07-16 MED ORDER — TIOTROPIUM BROMIDE MONOHYDRATE 2.5 MCG/ACT IN AERS: 2.0000 | INHALATION_SPRAY | Freq: Every day | RESPIRATORY_TRACT | Status: DC

## 2014-07-16 NOTE — Addendum Note (Signed)
Addended by: Boone MasterJONES, JESSICA E on: 07/16/2014 05:43 PM   Modules accepted: Orders

## 2014-07-16 NOTE — Progress Notes (Signed)
   Subjective:    Patient ID: [name], female    DOB: March 10, 1930, 81 y.o.   MRN: [id number]  HPI   [name] , smoker presents for FU of gold D COPD, 2-3 admits/ yr.  She has been on home O2 since dec 2010 ([employer]). She smoked 1/2 PPD, about 2740 Pyrs - chantix made her sick & she is afraid of cardiac side effects with patches. She sees Dr Myrtis SerKatz for CAD & has tolerated lisinopril & metoprolol, echo 7/10 showed nml LV fn & non dilated RV. An episode of syncope in 2009 was attributed to NTG & diuretics.   Significant tests/ events  PFTs 08/19/09 >> severe airway obstruction, FEV1 47%, no BD response, air trapping +, severe decrease in diffusion  CT angio 5/12 asc aortic aneurysm 41mm, hepatic cysts  Hosp 06/2011 for New paroxysmal atrial fibrillation with RVR and COPD exacerbation  - not felt to be a coumadin candidate secondary to hx of GIB/AVMs   Hosp adm 07/2012 for stent, 09/2012 for LLE cellulitis & bleeding, ABI (demonstarted moderate Left lower extremity PAD)  LE dopplers (no DVT)   Admitted 01/2014 for AECOPD   03/06/2014  Chief Complaint  Patient presents with  . Follow-up    f/u COPD; deep cough; chest congestion;chest tightness   Accompanied by sister Dyspnea at baseline, walks around the house, no sputum production Remains on symbicort and spiriva .  No chest pain, pedal edema Compliant with o2 Son mark lives closeby  07/16/2014 Post Hospital follow up  Returns for post hospital follow up  Admitted 1/8-1/12 for CAP, sepsis , UTI  tx w/ IV abx and nebs.  Discharged on abx. Omnicef/zithro.  She is feeling better.  Has good/bad days.  Get winded easily. On O2 2 l/m 24/7 .  No chest pain, orthopnea, edema or fever.  On Spiriva and Symbicort , wants to try Spiriva respimat.     Review of Systems neg for any significant sore throat, dysphagia, itching, sneezing, nasal congestion or excess/ purulent secretions, fever, chills, sweats, unintended wt loss,  pleuritic or exertional cp, hempoptysis, orthopnea pnd or change in chronic leg swelling. Also denies presyncope, palpitations, heartburn, abdominal pain, nausea, vomiting, diarrhea or change in bowel or urinary habits, dysuria,hematuria, rash, arthralgias, visual complaints, headache, numbness weakness or ataxia.      Objective:   Physical Exam  Gen. Pleasant, well-nourished, chronically ill,in no distress, in wc  ENT - no lesions, no post nasal drip Neck: No JVD, no thyromegaly, no carotid bruits Lungs: no use of accessory muscles, no dullness to percussion, decreased without rales or rhonchi  Cardiovascular: Rhythm regular, heart sounds  normal, no murmurs or gallops, no peripheral edema Musculoskeletal: No deformities, no cyanosis or clubbing  Skin: thin skin with scattered ecchymosis / skin tears.        Assessment & Plan:

## 2014-07-16 NOTE — Assessment & Plan Note (Signed)
Compensated without flare  Cont on current regimen , trial of spiriva respimat per pt request

## 2014-07-16 NOTE — Assessment & Plan Note (Signed)
Cont on O2 .  

## 2014-07-16 NOTE — Progress Notes (Signed)
Reviewed & agree with plan  

## 2014-07-16 NOTE — Patient Instructions (Addendum)
Chest xray today  Spiriva Respimat trial  Follow up with Dr. Vassie LollAlva 2 months and As needed   Please contact office for sooner follow up if symptoms do not improve or worsen or seek emergency care

## 2014-07-16 NOTE — Assessment & Plan Note (Signed)
Clinically improved  Check cxr to ensure clearance

## 2014-07-17 ENCOUNTER — Ambulatory Visit: Payer: Medicare Other | Admitting: Adult Health

## 2014-07-17 NOTE — Progress Notes (Signed)
Quick Note:  Called spoke with patient, advised of cxr results / recs as stated by TP. Pt verbalized her understanding and denied any questions. ______ 

## 2014-08-08 ENCOUNTER — Other Ambulatory Visit: Payer: Self-pay

## 2014-08-08 MED ORDER — DILTIAZEM HCL ER COATED BEADS 300 MG PO CP24: 300.0000 mg | ORAL_CAPSULE | Freq: Every day | ORAL | Status: DC

## 2014-09-05 ENCOUNTER — Ambulatory Visit
Admission: RE | Admit: 2014-09-05 | Discharge: 2014-09-05 | Disposition: A | Discharge status: Home / Self Care | Payer: Medicare Other | Source: Ambulatory Visit | Attending: Family Medicine | Admitting: Family Medicine

## 2014-09-05 ENCOUNTER — Other Ambulatory Visit: Payer: Self-pay | Admitting: Family Medicine

## 2014-09-05 DIAGNOSIS — R600 Localized edema: Secondary | ICD-10-CM

## 2014-09-05 DIAGNOSIS — M25511 Pain in right shoulder: Secondary | ICD-10-CM

## 2014-09-09 ENCOUNTER — Other Ambulatory Visit: Payer: Self-pay | Admitting: Pulmonary Disease

## 2014-09-10 NOTE — Telephone Encounter (Signed)
Next appt 09/19/14.

## 2014-09-19 ENCOUNTER — Ambulatory Visit: Payer: Medicare Other | Admitting: Pulmonary Disease

## 2014-10-23 ENCOUNTER — Other Ambulatory Visit: Payer: Self-pay | Admitting: Cardiology

## 2014-10-25 NOTE — Telephone Encounter (Signed)
Per note 11.30.15 

## 2014-10-28 ENCOUNTER — Emergency Department (HOSPITAL_COMMUNITY): Payer: Medicare Other

## 2014-10-28 ENCOUNTER — Emergency Department (HOSPITAL_COMMUNITY)
Admission: EM | Admit: 2014-10-28 | Discharge: 2014-10-28 | Disposition: A | Discharge status: Home / Self Care | Payer: Medicare Other | Attending: Emergency Medicine | Admitting: Emergency Medicine

## 2014-10-28 ENCOUNTER — Encounter (HOSPITAL_COMMUNITY): Payer: Self-pay | Admitting: Emergency Medicine

## 2014-10-28 DIAGNOSIS — Z794 Long term (current) use of insulin: Secondary | ICD-10-CM | POA: Insufficient documentation

## 2014-10-28 DIAGNOSIS — I480 Paroxysmal atrial fibrillation: Secondary | ICD-10-CM | POA: Insufficient documentation

## 2014-10-28 DIAGNOSIS — Z79899 Other long term (current) drug therapy: Secondary | ICD-10-CM | POA: Insufficient documentation

## 2014-10-28 DIAGNOSIS — I2510 Atherosclerotic heart disease of native coronary artery without angina pectoris: Secondary | ICD-10-CM | POA: Diagnosis not present

## 2014-10-28 DIAGNOSIS — R630 Anorexia: Secondary | ICD-10-CM | POA: Insufficient documentation

## 2014-10-28 DIAGNOSIS — I509 Heart failure, unspecified: Secondary | ICD-10-CM | POA: Diagnosis not present

## 2014-10-28 DIAGNOSIS — Z951 Presence of aortocoronary bypass graft: Secondary | ICD-10-CM | POA: Diagnosis not present

## 2014-10-28 DIAGNOSIS — Z9861 Coronary angioplasty status: Secondary | ICD-10-CM | POA: Insufficient documentation

## 2014-10-28 DIAGNOSIS — Z872 Personal history of diseases of the skin and subcutaneous tissue: Secondary | ICD-10-CM | POA: Insufficient documentation

## 2014-10-28 DIAGNOSIS — Z8611 Personal history of tuberculosis: Secondary | ICD-10-CM | POA: Insufficient documentation

## 2014-10-28 DIAGNOSIS — Z880 Allergy status to penicillin: Secondary | ICD-10-CM | POA: Insufficient documentation

## 2014-10-28 DIAGNOSIS — Z7951 Long term (current) use of inhaled steroids: Secondary | ICD-10-CM | POA: Insufficient documentation

## 2014-10-28 DIAGNOSIS — Z87891 Personal history of nicotine dependence: Secondary | ICD-10-CM | POA: Insufficient documentation

## 2014-10-28 DIAGNOSIS — E785 Hyperlipidemia, unspecified: Secondary | ICD-10-CM | POA: Diagnosis not present

## 2014-10-28 DIAGNOSIS — Z7982 Long term (current) use of aspirin: Secondary | ICD-10-CM | POA: Diagnosis not present

## 2014-10-28 DIAGNOSIS — Z9889 Other specified postprocedural states: Secondary | ICD-10-CM | POA: Diagnosis not present

## 2014-10-28 DIAGNOSIS — J449 Chronic obstructive pulmonary disease, unspecified: Secondary | ICD-10-CM | POA: Diagnosis not present

## 2014-10-28 DIAGNOSIS — N390 Urinary tract infection, site not specified: Secondary | ICD-10-CM | POA: Insufficient documentation

## 2014-10-28 DIAGNOSIS — I10 Essential (primary) hypertension: Secondary | ICD-10-CM | POA: Insufficient documentation

## 2014-10-28 DIAGNOSIS — R110 Nausea: Secondary | ICD-10-CM | POA: Insufficient documentation

## 2014-10-28 DIAGNOSIS — Z9981 Dependence on supplemental oxygen: Secondary | ICD-10-CM | POA: Insufficient documentation

## 2014-10-28 LAB — COMPREHENSIVE METABOLIC PANEL
ALT: 184 U/L — AB (ref 14–54)
ANION GAP: 13 (ref 5–15)
AST: 91 U/L — ABNORMAL HIGH (ref 15–41)
Albumin: 3.2 g/dL — ABNORMAL LOW (ref 3.5–5.0)
Alkaline Phosphatase: 158 U/L — ABNORMAL HIGH (ref 38–126)
BILIRUBIN TOTAL: 1.2 mg/dL (ref 0.3–1.2)
BUN: 12 mg/dL (ref 6–20)
CO2: 33 mmol/L — ABNORMAL HIGH (ref 22–32)
CREATININE: 1.19 mg/dL — AB (ref 0.44–1.00)
Calcium: 10 mg/dL (ref 8.9–10.3)
Chloride: 89 mmol/L — ABNORMAL LOW (ref 101–111)
GFR calc non Af Amer: 41 mL/min — ABNORMAL LOW (ref >60)
GFR, EST AFRICAN AMERICAN: 48 mL/min — AB (ref >60)
GLUCOSE: 166 mg/dL — AB (ref 65–99)
Potassium: 3.5 mmol/L (ref 3.5–5.1)
Sodium: 135 mmol/L (ref 135–145)
TOTAL PROTEIN: 6 g/dL — AB (ref 6.5–8.1)

## 2014-10-28 LAB — CBC
HCT: 34 % — ABNORMAL LOW (ref 36.0–46.0)
Hemoglobin: 11 g/dL — ABNORMAL LOW (ref 12.0–15.0)
MCH: 28.6 pg (ref 26.0–34.0)
MCHC: 32.4 g/dL (ref 30.0–36.0)
MCV: 88.5 fL (ref 78.0–100.0)
Platelets: 182 10*3/uL (ref 150–400)
RBC: 3.84 MIL/uL — ABNORMAL LOW (ref 3.87–5.11)
RDW: 13.7 % (ref 11.5–15.5)
WBC: 8.7 10*3/uL (ref 4.0–10.5)

## 2014-10-28 LAB — URINALYSIS, ROUTINE W REFLEX MICROSCOPIC
Bilirubin Urine: NEGATIVE
Glucose, UA: NEGATIVE mg/dL
Hgb urine dipstick: NEGATIVE
KETONES UR: NEGATIVE mg/dL
NITRITE: NEGATIVE
Protein, ur: NEGATIVE mg/dL
Specific Gravity, Urine: 1.016 (ref 1.005–1.030)
UROBILINOGEN UA: 0.2 mg/dL (ref 0.0–1.0)
pH: 5.5 (ref 5.0–8.0)

## 2014-10-28 LAB — URINE MICROSCOPIC-ADD ON

## 2014-10-28 LAB — TROPONIN I: Troponin I: 0.03 ng/mL (ref <0.031)

## 2014-10-28 LAB — LIPASE, BLOOD: Lipase: 31 U/L (ref 22–51)

## 2014-10-28 MED ORDER — ONDANSETRON HCL 4 MG/2ML IJ SOLN
4.0000 mg | Freq: Once | INTRAMUSCULAR | Status: AC
  Administered 2014-10-28: 4 mg via INTRAVENOUS
  Filled 2014-10-28: qty 2

## 2014-10-28 MED ORDER — ONDANSETRON HCL 8 MG PO TABS: 8.0000 mg | ORAL_TABLET | Freq: Three times a day (TID) | ORAL | Status: AC | PRN

## 2014-10-28 MED ORDER — CEPHALEXIN 500 MG PO CAPS: 500.0000 mg | ORAL_CAPSULE | Freq: Four times a day (QID) | ORAL | Status: DC

## 2014-10-28 MED ORDER — DEXTROSE 5 % IV SOLN
1.0000 g | Freq: Once | INTRAVENOUS | Status: AC
  Administered 2014-10-28: 1 g via INTRAVENOUS
  Filled 2014-10-28: qty 10

## 2014-10-28 NOTE — ED Notes (Signed)
NAD at this time. Pt is stable and leaving with her daughter. 

## 2014-10-28 NOTE — Discharge Instructions (Signed)
Drink 1-2 quarts of water each day. Avoid drinking [facility]. Start the antibiotic prescription today.     Nausea, Adult Nausea is the feeling that you have an upset stomach or have to vomit. Nausea by itself is not likely a serious concern, but it may be an early sign of more serious medical problems. As nausea gets worse, it can lead to vomiting. If vomiting develops, there is the risk of dehydration.  CAUSES   Viral infections.  Food poisoning.  Medicines.  Pregnancy.  Motion sickness.  Migraine headaches.  Emotional distress.  Severe pain from any source.  Alcohol intoxication. HOME CARE INSTRUCTIONS  Get plenty of rest.  Ask your caregiver about specific rehydration instructions.  Eat small amounts of food and sip liquids more often.  Take all medicines as told by your caregiver. SEEK MEDICAL CARE IF:  You have not improved after 2 days, or you get worse.  You have a headache. SEEK IMMEDIATE MEDICAL CARE IF:   You have a fever.  You faint.  You keep vomiting or have blood in your vomit.  You are extremely weak or dehydrated.  You have dark or bloody stools.  You have severe chest or abdominal pain. MAKE SURE YOU:  Understand these instructions.  Will watch your condition.  Will get help right away if you are not doing well or get worse. Document Released: 05/21/2004 Document Revised: 01/06/2012 Document Reviewed: 12/24/2010 [facility] Patient Information 2015 [place], [place]. This information is not intended to replace advice given to you by your health care provider. Make sure you discuss any questions you have with your health care provider.  Urinary Tract Infection Urinary tract infections (UTIs) can develop anywhere along your urinary tract. Your urinary tract is your body's drainage system for removing wastes and extra water. Your urinary tract includes two kidneys, two ureters, a bladder, and a urethra. Your kidneys are a pair of  bean-shaped organs. Each kidney is about the size of your fist. They are located below your ribs, one on each side of your spine. CAUSES Infections are caused by microbes, which are microscopic organisms, including fungi, viruses, and bacteria. These organisms are so small that they can only be seen through a microscope. Bacteria are the microbes that most commonly cause UTIs. SYMPTOMS  Symptoms of UTIs may vary by age and gender of the patient and by the location of the infection. Symptoms in young women typically include a frequent and intense urge to urinate and a painful, burning feeling in the bladder or urethra during urination. Older women and men are more likely to be tired, shaky, and weak and have muscle aches and abdominal pain. A fever may mean the infection is in your kidneys. Other symptoms of a kidney infection include pain in your back or sides below the ribs, nausea, and vomiting. DIAGNOSIS To diagnose a UTI, your caregiver will ask you about your symptoms. Your caregiver also will ask to provide a urine sample. The urine sample will be tested for bacteria and white blood cells. White blood cells are made by your body to help fight infection. TREATMENT  Typically, UTIs can be treated with medication. Because most UTIs are caused by a bacterial infection, they usually can be treated with the use of antibiotics. The choice of antibiotic and length of treatment depend on your symptoms and the type of bacteria causing your infection. HOME CARE INSTRUCTIONS  If you were prescribed antibiotics, take them exactly as your caregiver instructs you. Finish the  medication even if you feel better after you have only taken some of the medication.  Drink enough water and fluids to keep your urine clear or pale yellow.  Avoid caffeine, tea, and carbonated beverages. They tend to irritate your bladder.  Empty your bladder often. Avoid holding urine for long periods of time.  Empty your bladder  before and after sexual intercourse.  After a bowel movement, women should cleanse from front to back. Use each tissue only once. SEEK MEDICAL CARE IF:   You have back pain.  You develop a fever.  Your symptoms do not begin to resolve within 3 days. SEEK IMMEDIATE MEDICAL CARE IF:   You have severe back pain or lower abdominal pain.  You develop chills.  You have nausea or vomiting.  You have continued burning or discomfort with urination. MAKE SURE YOU:   Understand these instructions.  Will watch your condition.  Will get help right away if you are not doing well or get worse. Document Released: 01/21/2005 Document Revised: 10/13/2011 Document Reviewed: 05/22/2011 [facility] Patient Information 2015 [place]. This information is not intended to replace advice given to you by your health care provider. Make sure you discuss any questions you have with your health care provider.

## 2014-10-28 NOTE — ED Notes (Signed)
Patient transported to Ultrasound 

## 2014-10-28 NOTE — ED Notes (Signed)
Patient transported to X-ray 

## 2014-10-28 NOTE — ED Notes (Signed)
TO ED via [facility] from home with c/o increasing weakness for past two days-- [facility]. Pt is alert/oriented to person, place, thing-- unsure of day and time. Pt wears O2 continuously at home at 3L/M/[place].

## 2014-10-28 NOTE — ED Provider Notes (Signed)
CSN: [id number]     Arrival date & time 10/28/14  [id number] History   First MD Initiated Contact with Patient 10/28/14 [phone]     Chief Complaint  Patient presents with  . Nausea     (Consider location/radiation/quality/duration/timing/severity/associated sxs/prior Treatment) HPI   [name] is a 81 y.o. female who presents for evaluation of "nausea". She is a vague historian and has no other complaints. She's not sure if she was supposed to take any medications this morning. She states that she is not thirsty or hungry. She reports having decreased appetite yesterday. She is unable to specify any other concern or problem. There are no other known modifying factors.  Level V Caveat- poor historian   Past Medical History  Diagnosis Date  . CAD (coronary artery disease)     a. s/p CABG x5 (2005) b. s/p BMS-prox SVG-D2 (2014)  . Hypertension   . Hyperlipidemia   . GI bleed     AVMs  . Aneurysm, aortic     thoracic aorta, stable at 4.1 cm, chest CT, May, 2012  . Syncope     Nitroglycerin plus a diuretic, April, 2009  . Hyponatremia     Chronic. Felt secondary to SIADH   . COPD (chronic obstructive pulmonary disease)   . Tobacco abuse   . Bradycardia   . Carotid artery disease     Hx of endarterectomy. Doppler October, 2011, stable, 0-39% RIC A., 40-59% LICA  . Tuberculosis     Exposures to tuberculosis 1970s, has tested negative by the health Department  . Atrial fibrillation     Paroxysmal with RVR 07/2011, not felt to be a coumadin candidate secondary to hx of GIB/AVM  . Venous stasis of lower extremity     Chronic  . Ejection fraction     a. EF 55-60%, echo, April, 2013 b. EF 55-60%, mild biatrial enlargement and PASP 42 mmH  . CHF with left ventricular diastolic dysfunction, NYHA class 2   . AAA (abdominal aortic aneurysm)   . On home O2     2L N/C  . Complication of anesthesia   . Shortness of breath   . Hx of CABG     CABG 2005  . Hematoma     Right groin hematoma,  small, post cath, April, 2014  . Mitral regurgitation     Mild, hospital, March, 2014  . Leg ulcer     Ulcerated lesion on the anterior aspect of her left lower leg, June, 2014   Past Surgical History  Procedure Laterality Date  . Cholecystectomy    . Carotid endartercetomy    . Abdominal hysterectomy    . Coronary artery bypass graft  2005  . Coronary angioplasty with stent placement  07/18/12    severe native coronary artery disease, 99% proximal stenosis of the SVG-D2 status post successful PTCA/PCI with a Veriflex bare-metal stent, widely patent SVGs to both the right PDA sequential OM1 to OM 2, EF 65-70% and 3+ mitral regurgitation  . Left heart catheterization with coronary angiogram N/A 07/18/2012    Procedure: LEFT HEART CATHETERIZATION WITH CORONARY ANGIOGRAM;  Surgeon: Marykay Lexavid W Harding, MD;  [facility];  Service: Cardiovascular;  Laterality: N/A;  . Percutaneous coronary stent intervention (pci-s)  07/18/2012    Procedure: PERCUTANEOUS CORONARY STENT INTERVENTION (PCI-S);  Surgeon: Marykay Lexavid W Harding, MD;  [facility];  Service: Cardiovascular;;   Family History  Problem Relation Age of Onset  . Stroke Sister   .  Heart failure Mother   . Cancer Sister     Breast and Lung   . Cancer Brother     breast cancer   History  Substance Use Topics  . Smoking status: Former Smoker -- 0.50 packs/day for 59 years    Types: Cigarettes    Quit date: 02/01/2013  . Smokeless tobacco: Not on file  . Alcohol Use: No   OB History    No data available     Review of Systems  Unable to perform ROS     Allergies  Other; Codeine; Sulfonamide derivatives; Warfarin; Penicillins; and Ranitidine hcl  Home Medications   Prior to Admission medications   Medication Sig Start Date End Date Taking? Authorizing Provider  albuterol (PROVENTIL HFA;VENTOLIN HFA) 108 (90 BASE) MCG/ACT inhaler Inhale 2 puffs into the lungs every 4 (four) hours as needed for wheezing or shortness  of breath. 07/16/14   Tammy S Parrett, NP  albuterol (PROVENTIL) (2.5 MG/3ML) 0.083% nebulizer solution Take 2.5 mg by nebulization every 6 (six) hours as needed for wheezing.    Historical Provider, MD  ALPRAZolam Prudy Feeler) 0.25 MG tablet Take 0.25 mg by mouth at bedtime as needed for anxiety.    Historical Provider, MD  aspirin EC 81 MG tablet Take 81 mg by mouth daily.    Historical Provider, MD  atorvastatin (LIPITOR) 40 MG tablet Take 0.5 tablets (20 mg total) by mouth daily at 6 PM. 11/07/13   Rosalio Macadamia, NP  budesonide-formoterol (SYMBICORT) 160-4.5 MCG/ACT inhaler Inhale 2 puffs into the lungs 2 (two) times daily. 06/14/14   Oretha Milch, MD  CARTIA XT 300 MG 24 hr capsule TAKE 1 CAPSULE DAILY 10/25/14   Luis Abed, MD  furosemide (LASIX) 40 MG tablet Take 1 tablet (40 mg total) by mouth 2 (two) times daily. 08/29/13   Luis Abed, MD  insulin aspart protamine- aspart (NOVOLOG MIX 70/30) (70-30) 100 UNIT/ML injection Inject into the skin.    Historical Provider, MD  magnesium oxide (MAG-OX) 400 MG tablet Take 400 mg by mouth daily.    Historical Provider, MD  metFORMIN (GLUCOPHAGE) 500 MG tablet Take 500 mg by mouth 2 (two) times daily with a meal.    Historical Provider, MD  metoprolol succinate (TOPROL-XL) 25 MG 24 hr tablet Take 1 tablet (25 mg total) by mouth daily. 11/24/13   Luis Abed, MD  Multiple Vitamins-Minerals (PRESERVISION AREDS 2 PO) Take 1 tablet by mouth 2 (two) times daily.    Historical Provider, MD  nitroGLYCERIN (NITROSTAT) 0.4 MG SL tablet Place 0.4 mg under the tongue every 5 (five) minutes as needed for chest pain.    Historical Provider, MD  OXYGEN Inhale into the lungs. 2L continously    Historical Provider, MD  pantoprazole (PROTONIX) 40 MG tablet Take 40 mg by mouth daily.    Historical Provider, MD  potassium chloride SA (K-DUR,KLOR-CON) 20 MEQ tablet Take 20 mEq by mouth 2 (two) times daily.    Historical Provider, MD  SPIRIVA HANDIHALER 18 MCG  inhalation capsule INHALE THE CONTENTS OF 1 CAPSULE DAILY 09/10/14   Oretha Milch, MD  Tiotropium Bromide Monohydrate (SPIRIVA RESPIMAT) 2.5 MCG/ACT AERS Inhale 2 puffs into the lungs daily. 07/16/14   Tammy S Parrett, NP  Vitamin D, Ergocalciferol, (DRISDOL) 50000 UNITS CAPS Take 50,000 Units by mouth every 14 (fourteen) days. Every other Sunday    Historical Provider, MD   BP 119/54 mmHg  Pulse 109  Temp(Src) 98.3 F (36.8  C) (Oral)  Resp 18  SpO2 99% Physical Exam  Constitutional: She appears well-developed.  Elderly, frail  HENT:  Head: Normocephalic and atraumatic.  Right Ear: External ear normal.  Left Ear: External ear normal.  Mucous membranes are slightly dry  Eyes: Conjunctivae and EOM are normal. Pupils are equal, round, and reactive to light. Right eye exhibits no discharge. Left eye exhibits no discharge. No scleral icterus.  Neck: Normal range of motion and phonation normal. Neck supple.  Cardiovascular: Normal rate, regular rhythm and normal heart sounds.   Pulmonary/Chest: Effort normal and breath sounds normal. She exhibits no bony tenderness.  Abdominal: Soft. There is no tenderness.  Musculoskeletal: Normal range of motion. She exhibits no edema or tenderness.  Neurological: She is alert. No cranial nerve deficit or sensory deficit. She exhibits normal muscle tone. Coordination normal.  Skin: Skin is warm, dry and intact.  Scattered excoriations of anterior surfaces of lower arms and legs, bilaterally, consistent with self-induced scratching injuries.  Psychiatric: She has a normal mood and affect. Her behavior is normal.  Nursing note and vitals reviewed.   ED Course  Procedures (including critical care time) Medications  ondansetron (ZOFRAN) injection 4 mg (not administered)    Patient Vitals for the past 24 hrs:  BP Temp Temp src Pulse Resp SpO2  10/28/14 0930 (!) 119/54 mmHg 98.3 F (36.8 C) Oral 109 18 99 %  10/28/14 0925 - - - - - 94 %    1:15 PM  Reevaluation with update and discussion. After initial assessment and treatment, an updated evaluation reveals patient is unchanged. She's been able to drink some fluids but still complains of nausea. Her daughter is here now and is concerned that she has not been able to anything in the last 3 days. Patient has been able to drink 5 or 6 Mountain Dew sodas each day. She probably drinks [facility] very heavily. The daughter did not offer any additional information. Anneta Rounds L   3:32 PM Reevaluation with update and discussion. After initial assessment and treatment, an updated evaluation reveals no additional complaints. Findings discussed with family and patient, all questions answered.Mancel Bale L   Labs Review Labs Reviewed - No data to display  Imaging Review No results found.   EKG Interpretation None      MDM   Final diagnoses:  Nausea  Anorexia  Urinary tract infection without hematuria, site unspecified    Nonspecific, nausea, with mild transaminitis. No urinary tract symptoms with borderline abnormal urine testing. Culture pending. No evidence for liver or pancreas abnormalities. She is status post cholecystectomy. Doubt sepsis, metabolic instability or impending vascular collapse. I suspect that she is mildly dehydrated. She is stable for discharge, with outpatient management.  Nursing Notes Reviewed/ Care Coordinated Applicable Imaging Reviewed Interpretation of Laboratory Data incorporated into ED treatment  The patient appears reasonably screened and/or stabilized for discharge and I doubt any other medical condition or other [facility] requiring further screening, evaluation, or treatment in the ED at this time prior to discharge.  Plan: Home Medications- Zofran; Home Treatments- push oral fluids and food; return here if the recommended treatment, does not improve the symptoms; Recommended follow up- PCP check up in 2-3 days   Mancel Bale, MD 10/28/14 1651

## 2014-10-28 NOTE — ED Notes (Signed)
Pt not wanting to eat anything. Taking sips of sprite. States "i feel like I am going to throw up" . Daughter - in - law at bedside states that she cannot make her eat-- has been cooking dinner, cleaning for pt.

## 2014-10-29 LAB — URINE CULTURE: Special Requests: NORMAL

## 2014-11-17 ENCOUNTER — Inpatient Hospital Stay (HOSPITAL_COMMUNITY)
Admission: EM | Admit: 2014-11-17 | Discharge: 2014-11-23 | DRG: 872 | Disposition: A | Discharge status: Skilled Nursing Facility | Payer: Medicare Other | Attending: Internal Medicine | Admitting: Internal Medicine

## 2014-11-17 ENCOUNTER — Emergency Department (HOSPITAL_COMMUNITY): Payer: Medicare Other

## 2014-11-17 ENCOUNTER — Encounter (HOSPITAL_COMMUNITY): Payer: Self-pay

## 2014-11-17 DIAGNOSIS — K8050 Calculus of bile duct without cholangitis or cholecystitis without obstruction: Secondary | ICD-10-CM

## 2014-11-17 DIAGNOSIS — I4891 Unspecified atrial fibrillation: Secondary | ICD-10-CM | POA: Diagnosis present

## 2014-11-17 DIAGNOSIS — R630 Anorexia: Secondary | ICD-10-CM | POA: Diagnosis present

## 2014-11-17 DIAGNOSIS — E785 Hyperlipidemia, unspecified: Secondary | ICD-10-CM | POA: Diagnosis present

## 2014-11-17 DIAGNOSIS — Z87891 Personal history of nicotine dependence: Secondary | ICD-10-CM

## 2014-11-17 DIAGNOSIS — Z888 Allergy status to other drugs, medicaments and biological substances status: Secondary | ICD-10-CM

## 2014-11-17 DIAGNOSIS — E872 Acidosis: Secondary | ICD-10-CM | POA: Diagnosis present

## 2014-11-17 DIAGNOSIS — Z8249 Family history of ischemic heart disease and other diseases of the circulatory system: Secondary | ICD-10-CM

## 2014-11-17 DIAGNOSIS — E860 Dehydration: Secondary | ICD-10-CM | POA: Diagnosis present

## 2014-11-17 DIAGNOSIS — R740 Nonspecific elevation of levels of transaminase and lactic acid dehydrogenase [LDH]: Secondary | ICD-10-CM | POA: Diagnosis present

## 2014-11-17 DIAGNOSIS — E876 Hypokalemia: Secondary | ICD-10-CM | POA: Diagnosis present

## 2014-11-17 DIAGNOSIS — Z885 Allergy status to narcotic agent status: Secondary | ICD-10-CM

## 2014-11-17 DIAGNOSIS — Z853 Personal history of malignant neoplasm of breast: Secondary | ICD-10-CM

## 2014-11-17 DIAGNOSIS — Z794 Long term (current) use of insulin: Secondary | ICD-10-CM

## 2014-11-17 DIAGNOSIS — F0390 Unspecified dementia without behavioral disturbance: Secondary | ICD-10-CM | POA: Diagnosis present

## 2014-11-17 DIAGNOSIS — I2510 Atherosclerotic heart disease of native coronary artery without angina pectoris: Secondary | ICD-10-CM | POA: Diagnosis present

## 2014-11-17 DIAGNOSIS — Z9981 Dependence on supplemental oxygen: Secondary | ICD-10-CM

## 2014-11-17 DIAGNOSIS — R7989 Other specified abnormal findings of blood chemistry: Secondary | ICD-10-CM

## 2014-11-17 DIAGNOSIS — A4152 Sepsis due to Pseudomonas: Secondary | ICD-10-CM | POA: Diagnosis not present

## 2014-11-17 DIAGNOSIS — Z79899 Other long term (current) drug therapy: Secondary | ICD-10-CM

## 2014-11-17 DIAGNOSIS — K8689 Other specified diseases of pancreas: Secondary | ICD-10-CM

## 2014-11-17 DIAGNOSIS — I878 Other specified disorders of veins: Secondary | ICD-10-CM | POA: Diagnosis present

## 2014-11-17 DIAGNOSIS — R0602 Shortness of breath: Secondary | ICD-10-CM

## 2014-11-17 DIAGNOSIS — I480 Paroxysmal atrial fibrillation: Secondary | ICD-10-CM | POA: Diagnosis present

## 2014-11-17 DIAGNOSIS — Z955 Presence of coronary angioplasty implant and graft: Secondary | ICD-10-CM

## 2014-11-17 DIAGNOSIS — K869 Disease of pancreas, unspecified: Secondary | ICD-10-CM | POA: Diagnosis present

## 2014-11-17 DIAGNOSIS — I712 Thoracic aortic aneurysm, without rupture: Secondary | ICD-10-CM | POA: Diagnosis present

## 2014-11-17 DIAGNOSIS — E119 Type 2 diabetes mellitus without complications: Secondary | ICD-10-CM | POA: Diagnosis present

## 2014-11-17 DIAGNOSIS — J449 Chronic obstructive pulmonary disease, unspecified: Secondary | ICD-10-CM | POA: Diagnosis present

## 2014-11-17 DIAGNOSIS — R945 Abnormal results of liver function studies: Secondary | ICD-10-CM

## 2014-11-17 DIAGNOSIS — A419 Sepsis, unspecified organism: Secondary | ICD-10-CM | POA: Diagnosis present

## 2014-11-17 DIAGNOSIS — R4182 Altered mental status, unspecified: Secondary | ICD-10-CM | POA: Diagnosis present

## 2014-11-17 DIAGNOSIS — Z880 Allergy status to penicillin: Secondary | ICD-10-CM

## 2014-11-17 DIAGNOSIS — R7401 Elevation of levels of liver transaminase levels: Secondary | ICD-10-CM | POA: Diagnosis present

## 2014-11-17 DIAGNOSIS — Z882 Allergy status to sulfonamides status: Secondary | ICD-10-CM

## 2014-11-17 DIAGNOSIS — Z7982 Long term (current) use of aspirin: Secondary | ICD-10-CM

## 2014-11-17 DIAGNOSIS — R778 Other specified abnormalities of plasma proteins: Secondary | ICD-10-CM

## 2014-11-17 DIAGNOSIS — J9610 Chronic respiratory failure, unspecified whether with hypoxia or hypercapnia: Secondary | ICD-10-CM | POA: Diagnosis present

## 2014-11-17 DIAGNOSIS — K831 Obstruction of bile duct: Secondary | ICD-10-CM | POA: Insufficient documentation

## 2014-11-17 DIAGNOSIS — K5900 Constipation, unspecified: Secondary | ICD-10-CM | POA: Diagnosis present

## 2014-11-17 DIAGNOSIS — D696 Thrombocytopenia, unspecified: Secondary | ICD-10-CM | POA: Diagnosis present

## 2014-11-17 DIAGNOSIS — I714 Abdominal aortic aneurysm, without rupture: Secondary | ICD-10-CM | POA: Diagnosis present

## 2014-11-17 DIAGNOSIS — D649 Anemia, unspecified: Secondary | ICD-10-CM | POA: Diagnosis present

## 2014-11-17 DIAGNOSIS — I5032 Chronic diastolic (congestive) heart failure: Secondary | ICD-10-CM | POA: Diagnosis present

## 2014-11-17 DIAGNOSIS — I10 Essential (primary) hypertension: Secondary | ICD-10-CM | POA: Diagnosis present

## 2014-11-17 DIAGNOSIS — I340 Nonrheumatic mitral (valve) insufficiency: Secondary | ICD-10-CM | POA: Diagnosis present

## 2014-11-17 DIAGNOSIS — Z951 Presence of aortocoronary bypass graft: Secondary | ICD-10-CM

## 2014-11-17 DIAGNOSIS — N390 Urinary tract infection, site not specified: Secondary | ICD-10-CM | POA: Diagnosis present

## 2014-11-17 DIAGNOSIS — K219 Gastro-esophageal reflux disease without esophagitis: Secondary | ICD-10-CM | POA: Diagnosis present

## 2014-11-17 LAB — URINALYSIS, ROUTINE W REFLEX MICROSCOPIC
GLUCOSE, UA: NEGATIVE mg/dL
HGB URINE DIPSTICK: NEGATIVE
Ketones, ur: 15 mg/dL — AB
Nitrite: POSITIVE — AB
Protein, ur: 100 mg/dL — AB
Specific Gravity, Urine: 1.021 (ref 1.005–1.030)
UROBILINOGEN UA: 2 mg/dL — AB (ref 0.0–1.0)
pH: 7.5 (ref 5.0–8.0)

## 2014-11-17 LAB — LIPASE, BLOOD: Lipase: 28 U/L (ref 22–51)

## 2014-11-17 LAB — COMPREHENSIVE METABOLIC PANEL
ALBUMIN: 3.6 g/dL (ref 3.5–5.0)
ALT: 415 U/L — ABNORMAL HIGH (ref 14–54)
ANION GAP: 13 (ref 5–15)
AST: 353 U/L — AB (ref 15–41)
Alkaline Phosphatase: 301 U/L — ABNORMAL HIGH (ref 38–126)
BILIRUBIN TOTAL: 5 mg/dL — AB (ref 0.3–1.2)
BUN: 12 mg/dL (ref 6–20)
CO2: 33 mmol/L — AB (ref 22–32)
CREATININE: 1.22 mg/dL — AB (ref 0.44–1.00)
Calcium: 10.3 mg/dL (ref 8.9–10.3)
Chloride: 87 mmol/L — ABNORMAL LOW (ref 101–111)
GFR calc Af Amer: 46 mL/min — ABNORMAL LOW (ref >60)
GFR, EST NON AFRICAN AMERICAN: 40 mL/min — AB (ref >60)
Glucose, Bld: 189 mg/dL — ABNORMAL HIGH (ref 65–99)
Potassium: 3.4 mmol/L — ABNORMAL LOW (ref 3.5–5.1)
SODIUM: 133 mmol/L — AB (ref 135–145)
Total Protein: 7 g/dL (ref 6.5–8.1)

## 2014-11-17 LAB — CBC WITH DIFFERENTIAL/PLATELET
BASOS ABS: 0 10*3/uL (ref 0.0–0.1)
Basophils Relative: 0 % (ref 0–1)
Eosinophils Absolute: 0 10*3/uL (ref 0.0–0.7)
Eosinophils Relative: 0 % (ref 0–5)
HCT: 36.2 % (ref 36.0–46.0)
Hemoglobin: 11.9 g/dL — ABNORMAL LOW (ref 12.0–15.0)
LYMPHS ABS: 0.2 10*3/uL — AB (ref 0.7–4.0)
LYMPHS PCT: 1 % — AB (ref 12–46)
MCH: 28.8 pg (ref 26.0–34.0)
MCHC: 32.9 g/dL (ref 30.0–36.0)
MCV: 87.7 fL (ref 78.0–100.0)
Monocytes Absolute: 0.8 10*3/uL (ref 0.1–1.0)
Monocytes Relative: 5 % (ref 3–12)
Neutro Abs: 16.9 10*3/uL — ABNORMAL HIGH (ref 1.7–7.7)
Neutrophils Relative %: 94 % — ABNORMAL HIGH (ref 43–77)
PLATELETS: 192 10*3/uL (ref 150–400)
RBC: 4.13 MIL/uL (ref 3.87–5.11)
RDW: 13.8 % (ref 11.5–15.5)
WBC: 17.9 10*3/uL — AB (ref 4.0–10.5)

## 2014-11-17 LAB — CBG MONITORING, ED: Glucose-Capillary: 171 mg/dL — ABNORMAL HIGH (ref 65–99)

## 2014-11-17 LAB — PROTIME-INR
INR: 1.13 (ref 0.00–1.49)
Prothrombin Time: 14.7 seconds (ref 11.6–15.2)

## 2014-11-17 LAB — URINE MICROSCOPIC-ADD ON

## 2014-11-17 LAB — I-STAT CG4 LACTIC ACID, ED: LACTIC ACID, VENOUS: 2.73 mmol/L — AB (ref 0.5–2.0)

## 2014-11-17 LAB — BRAIN NATRIURETIC PEPTIDE: B NATRIURETIC PEPTIDE 5: 179 pg/mL — AB (ref 0.0–100.0)

## 2014-11-17 LAB — TROPONIN I: Troponin I: 0.04 ng/mL — ABNORMAL HIGH (ref <0.031)

## 2014-11-17 MED ORDER — SODIUM CHLORIDE 0.9 % IV SOLN
500.0000 mg | Freq: Once | INTRAVENOUS | Status: DC
  Filled 2014-11-17: qty 500

## 2014-11-17 MED ORDER — SODIUM CHLORIDE 0.9 % IV SOLN
Freq: Once | INTRAVENOUS | Status: AC
  Administered 2014-11-17: 1000 mL via INTRAVENOUS

## 2014-11-17 MED ORDER — PIPERACILLIN-TAZOBACTAM 3.375 G IVPB 30 MIN: 3.3750 g | Freq: Once | INTRAVENOUS | Status: DC

## 2014-11-17 MED ORDER — SODIUM CHLORIDE 0.9 % IV BOLUS (SEPSIS)
500.0000 mL | Freq: Once | INTRAVENOUS | Status: AC
  Administered 2014-11-18: 500 mL via INTRAVENOUS

## 2014-11-17 MED ORDER — IOHEXOL 300 MG/ML  SOLN
25.0000 mL | Freq: Once | INTRAMUSCULAR | Status: AC | PRN
  Administered 2014-11-17: 25 mL via ORAL

## 2014-11-17 MED ORDER — VANCOMYCIN HCL 10 G IV SOLR
1250.0000 mg | Freq: Once | INTRAVENOUS | Status: AC
  Administered 2014-11-18: 1250 mg via INTRAVENOUS
  Filled 2014-11-17: qty 1250

## 2014-11-17 MED ORDER — DILTIAZEM HCL 100 MG IV SOLR
5.0000 mg/h | Freq: Once | INTRAVENOUS | Status: AC
  Administered 2014-11-17: 10 mg/h via INTRAVENOUS

## 2014-11-17 NOTE — ED Notes (Signed)
CBG 171 

## 2014-11-17 NOTE — ED Notes (Signed)
Pt from home, lives with family, hx of dementia, family reports not taking meds (specifically cardiazem today) pt has been drinking a lot of mountain dew as well and now presents with increased confusion and tachycardia.

## 2014-11-17 NOTE — ED Provider Notes (Signed)
CSN: [id number]     Arrival date & time    History   First MD Initiated Contact with Patient 11/17/14 2200     Chief Complaint  Patient presents with  . Altered Mental Status  . Tachycardia     (Consider location/radiation/quality/duration/timing/severity/associated sxs/prior Treatment) HPI  81 y.o. female with history of COPD, chronic respiratory failure, chronic atrial fibrillation not on Coumadin secondary to GI bleed/AVMs, CAD presents to the ER for altered mental status today. Family reports patient has been declining slowly over the past month with decreased by mouth intake. Today they report she was not acting her normal self. Was not eating at all. Began slumping over.  Family at bedside denies patient complaining of any chest pain cough, shortness of breath. Denied witnessing any vomiting, syncopal episodes, or falls. EMS arrived at the patient's as and found to be tachycardic into the 160s and 170s. Not hypotensive however. Started on diltiazem drip and brought to the emergency department.  Past Medical History  Diagnosis Date  . CAD (coronary artery disease)     a. s/p CABG x5 (2005) b. s/p BMS-prox SVG-D2 (2014)  . Hypertension   . Hyperlipidemia   . GI bleed     AVMs  . Aneurysm, aortic     thoracic aorta, stable at 4.1 cm, chest CT, May, 2012  . Syncope     Nitroglycerin plus a diuretic, April, 2009  . Hyponatremia     Chronic. Felt secondary to SIADH   . COPD (chronic obstructive pulmonary disease)   . Tobacco abuse   . Bradycardia   . Carotid artery disease     Hx of endarterectomy. Doppler October, 2011, stable, 0-39% RIC A., 40-59% LICA  . Tuberculosis     Exposures to tuberculosis 1970s, has tested negative by the health Department  . Atrial fibrillation     Paroxysmal with RVR 07/2011, not felt to be a coumadin candidate secondary to hx of GIB/AVM  . Venous stasis of lower extremity     Chronic  . Ejection fraction     a. EF 55-60%, echo, April, 2013 b. EF  55-60%, mild biatrial enlargement and PASP 42 mmH  . CHF with left ventricular diastolic dysfunction, NYHA class 2   . AAA (abdominal aortic aneurysm)   . On home O2     2L N/C  . Complication of anesthesia   . Shortness of breath   . Hx of CABG     CABG 2005  . Hematoma     Right groin hematoma, small, post cath, April, 2014  . Mitral regurgitation     Mild, hospital, March, 2014  . Leg ulcer     Ulcerated lesion on the anterior aspect of her left lower leg, June, 2014   Past Surgical History  Procedure Laterality Date  . Cholecystectomy    . Carotid endartercetomy    . Abdominal hysterectomy    . Coronary artery bypass graft  2005  . Coronary angioplasty with stent placement  07/18/12    severe native coronary artery disease, 99% proximal stenosis of the SVG-D2 status post successful PTCA/PCI with a Veriflex bare-metal stent, widely patent SVGs to both the right PDA sequential OM1 to OM 2, EF 65-70% and 3+ mitral regurgitation  . Left heart catheterization with coronary angiogram N/A 07/18/2012    Procedure: LEFT HEART CATHETERIZATION WITH CORONARY ANGIOGRAM;  Surgeon: Marykay Lex, MD;  Location: [facility] CATH LAB;  Service: Cardiovascular;  Laterality: N/A;  . Percutaneous coronary  stent intervention (pci-s)  07/18/2012    Procedure: PERCUTANEOUS CORONARY STENT INTERVENTION (PCI-S);  Surgeon: Marykay Lex, MD;  Location: [facility] CATH LAB;  Service: Cardiovascular;;   Family History  Problem Relation Age of Onset  . Stroke Sister   . Heart failure Mother   . Cancer Sister     Breast and Lung   . Cancer Brother     breast cancer   History  Substance Use Topics  . Smoking status: Former Smoker -- 0.50 packs/day for 59 years    Types: Cigarettes    Quit date: 02/01/2013  . Smokeless tobacco: Not on file  . Alcohol Use: No   OB History    No data available     Review of Systems  Unable to perform ROS: Mental status change  Cardiovascular: Negative for chest pain and  palpitations.  Gastrointestinal: Positive for abdominal pain.   Allergies  Other; Codeine; Sulfonamide derivatives; Warfarin; Penicillins; and Ranitidine hcl  Home Medications   Prior to Admission medications   Medication Sig Start Date End Date Taking? Authorizing Provider  albuterol (PROVENTIL HFA;VENTOLIN HFA) 108 (90 BASE) MCG/ACT inhaler Inhale 2 puffs into the lungs every 4 (four) hours as needed for wheezing or shortness of breath. 07/16/14   Tammy S Parrett, NP  albuterol (PROVENTIL) (2.5 MG/3ML) 0.083% nebulizer solution Take 2.5 mg by nebulization every 6 (six) hours as needed for wheezing.    Historical Provider, MD  ALPRAZolam Prudy Feeler) 0.25 MG tablet Take 0.25 mg by mouth at bedtime as needed for anxiety.    Historical Provider, MD  aspirin EC 81 MG tablet Take 81 mg by mouth daily.    Historical Provider, MD  atorvastatin (LIPITOR) 40 MG tablet Take 0.5 tablets (20 mg total) by mouth daily at 6 PM. 11/07/13   Rosalio Macadamia, NP  budesonide-formoterol (SYMBICORT) 160-4.5 MCG/ACT inhaler Inhale 2 puffs into the lungs 2 (two) times daily. 06/14/14   Oretha Milch, MD  CARTIA XT 300 MG 24 hr capsule TAKE 1 CAPSULE DAILY 10/25/14   Luis Abed, MD  cephALEXin (KEFLEX) 500 MG capsule Take 1 capsule (500 mg total) by mouth 4 (four) times daily. 10/28/14   Mancel Bale, MD  furosemide (LASIX) 40 MG tablet Take 1 tablet (40 mg total) by mouth 2 (two) times daily. 08/29/13   Luis Abed, MD  insulin aspart protamine- aspart (NOVOLOG MIX 70/30) (70-30) 100 UNIT/ML injection Inject into the skin.    Historical Provider, MD  magnesium oxide (MAG-OX) 400 MG tablet Take 400 mg by mouth daily.    Historical Provider, MD  metFORMIN (GLUCOPHAGE) 500 MG tablet Take 500 mg by mouth 2 (two) times daily with a meal.    Historical Provider, MD  metoprolol succinate (TOPROL-XL) 25 MG 24 hr tablet Take 1 tablet (25 mg total) by mouth daily. 11/24/13   Luis Abed, MD  Multiple Vitamins-Minerals  (PRESERVISION AREDS 2 PO) Take 1 tablet by mouth 2 (two) times daily.    Historical Provider, MD  nitroGLYCERIN (NITROSTAT) 0.4 MG SL tablet Place 0.4 mg under the tongue every 5 (five) minutes as needed for chest pain.    Historical Provider, MD  ondansetron (ZOFRAN) 8 MG tablet Take 1 tablet (8 mg total) by mouth every 8 (eight) hours as needed for nausea or vomiting. 10/28/14   Mancel Bale, MD  OXYGEN Inhale into the lungs. 2L continously    Historical Provider, MD  pantoprazole (PROTONIX) 40 MG tablet Take 40 mg by  mouth daily.    Historical Provider, MD  potassium chloride SA (K-DUR,KLOR-CON) 20 MEQ tablet Take 20 mEq by mouth 2 (two) times daily.    Historical Provider, MD  SPIRIVA HANDIHALER 18 MCG inhalation capsule INHALE THE CONTENTS OF 1 CAPSULE DAILY 09/10/14   Oretha Milch, MD  Tiotropium Bromide Monohydrate (SPIRIVA RESPIMAT) 2.5 MCG/ACT AERS Inhale 2 puffs into the lungs daily. 07/16/14   Tammy S Parrett, NP  Vitamin D, Ergocalciferol, (DRISDOL) 50000 UNITS CAPS Take 50,000 Units by mouth every 14 (fourteen) days. Every other Sunday    Historical Provider, MD   BP 124/77 mmHg  Pulse 117  Temp(Src) 101.9 F (38.8 C) (Rectal)  Resp 22  Ht 6' (1.829 m)  Wt 184 lb (83.462 kg)  BMI 24.95 kg/m2  SpO2 89% Physical Exam  Constitutional: She has a sickly appearance. She appears distressed.  HENT:  Head: Normocephalic and atraumatic.  Mouth/Throat: No oropharyngeal exudate.  Eyes: Conjunctivae are normal. Pupils are equal, round, and reactive to light.  Neck: Normal range of motion.  Cardiovascular: An irregularly irregular rhythm present. Tachycardia present.   Pulses:      Radial pulses are 2+ on the right side, and 2+ on the left side.  Pulmonary/Chest: She has no decreased breath sounds. She has no wheezes.  Abdominal: There is generalized tenderness. There is no rigidity, no rebound, no guarding, no CVA tenderness, no tenderness at McBurney's point and negative Murphy's sign.   Neurological: She is alert. She has normal strength. No cranial nerve deficit. GCS eye subscore is 4. GCS verbal subscore is 5. GCS motor subscore is 6.    ED Course  Procedures (including critical care time) Labs Review Labs Reviewed  CBC WITH DIFFERENTIAL/PLATELET - Abnormal; Notable for the following:    WBC 17.9 (*)    Hemoglobin 11.9 (*)    Neutrophils Relative % 94 (*)    Neutro Abs 16.9 (*)    Lymphocytes Relative 1 (*)    Lymphs Abs 0.2 (*)    All other components within normal limits  COMPREHENSIVE METABOLIC PANEL - Abnormal; Notable for the following:    Sodium 133 (*)    Potassium 3.4 (*)    Chloride 87 (*)    CO2 33 (*)    Glucose, Bld 189 (*)    Creatinine, Ser 1.22 (*)    AST 353 (*)    ALT 415 (*)    Alkaline Phosphatase 301 (*)    Total Bilirubin 5.0 (*)    GFR calc non Af Amer 40 (*)    GFR calc Af Amer 46 (*)    All other components within normal limits  TROPONIN I - Abnormal; Notable for the following:    Troponin I 0.04 (*)    All other components within normal limits  URINALYSIS, ROUTINE W REFLEX MICROSCOPIC (NOT AT [facility]) - Abnormal; Notable for the following:    Bilirubin Urine MODERATE (*)    Ketones, ur 15 (*)    Protein, ur 100 (*)    Urobilinogen, UA 2.0 (*)    Nitrite POSITIVE (*)    Leukocytes, UA SMALL (*)    All other components within normal limits  BRAIN NATRIURETIC PEPTIDE - Abnormal; Notable for the following:    B Natriuretic Peptide 179.0 (*)    All other components within normal limits  URINE MICROSCOPIC-ADD ON - Abnormal; Notable for the following:    Bacteria, UA FEW (*)    All other components within normal limits  I-STAT  CG4 LACTIC ACID, ED - Abnormal; Notable for the following:    Lactic Acid, Venous 2.73 (*)    All other components within normal limits  CBG MONITORING, ED - Abnormal; Notable for the following:    Glucose-Capillary 171 (*)    All other components within normal limits  URINE CULTURE  CULTURE, BLOOD  (ROUTINE X 2)  CULTURE, BLOOD (ROUTINE X 2)  LIPASE, BLOOD  PROTIME-INR  I-STAT CG4 LACTIC ACID, ED  I-STAT CG4 LACTIC ACID, ED    Imaging Review Ct Abdomen Pelvis W Contrast  11/18/2014   CLINICAL DATA:  81 year old female with fever and abdominal pain.  EXAM: CT ABDOMEN AND PELVIS WITH CONTRAST  TECHNIQUE: Multidetector CT imaging of the abdomen and pelvis was performed using the standard protocol following bolus administration of intravenous contrast.  CONTRAST:  80mL OMNIPAQUE IOHEXOL 300 MG/ML  SOLN  COMPARISON:  CT dated 05/04/2014 and ultrasound dated 10/28/2014  FINDINGS: Evaluation is limited due to streak artifact caused by patient's arms and metallic left hip arthroplasty.  Emphysematous changes of the lung bases. There is coronary vascular calcification. No intra-abdominal free air or free fluid.  There is mild hepatic surface irregularity. A 1.6 x 1.0 cm hypodense lesion in the inferior right lobe of the liver (series 201 image 37) is not well characterized. MRI may provide better evaluation. Cholecystectomy. The pancreas is atrophic. There is an ill-defined soft tissue prominence involving the head of the pancreas at the ampulla of Vater. This area is not well evaluated on the CT. The the capillary mass or pancreatic lesion is not excluded. Endoscopic ultrasound is recommended for better evaluation. The spleen, adrenal glands appear unremarkable. There is moderate bilateral renal atrophy. There is no hydronephrosis. The visualized ureters and urinary bladder appear unremarkable. Hysterectomy.  Extensive sigmoid diverticulosis with muscular hypertrophy. No definite active inflammatory changes. Moderate stool in the distal colon. No evidence of bowel obstruction. There is a 1.6 cm duodenal diverticulum. There is soft tissue density along the medial wall of the second portion of the duodenum (series 201, image 35). Normal appendix.  There is aortoiliac atherosclerotic disease. A 3.2 cm  infrarenal abdominal aortic aneurysm. The origins of the celiac axis, SMA, IMA as well as the origins of the renal arteries appear patent. No portal venous gas identified. Ill-defined nodular soft tissue density in the region of the porta hepatis likely represent lymph nodes. There is diastases of anterior abdominal wall musculature in the midline with a small fat containing umbilical hernia. Degenerative changes of the spine. Left hip arthroplasty. No acute fracture.  IMPRESSION: Ill-defined soft tissue thickness involving the head and uncinate process of the pancreas as well as the medial wall of the second portion of the duodenum. MRI or endoscopic ultrasound is recommended for further evaluation.  Sigmoid diverticulosis. No evidence of bowel obstruction or inflammation. Normal appendix.  Incompletely characterized hypodense lesion along the inferior surface of the right lobe of the liver. MRI may provide better characterization.   Electronically Signed   By: Elgie Collard M.D.   On: 11/18/2014 01:03   Dg Chest Portable 1 View  11/17/2014   CLINICAL DATA:  81 year old female with shortness of breath and tachycardia.  EXAM: PORTABLE CHEST - 1 VIEW  COMPARISON:  Radiograph dated 10/28/2014  FINDINGS: Single-view of the chest demonstrate emphysematous changes of the lungs. There is no focal consolidation, pleural effusion, or pneumothorax. The cardiomediastinal silhouette is within normal limits. Degenerative changes of the osseous structures.  IMPRESSION: Emphysema.  No  focal consolidation or pneumothorax.   Electronically Signed   By: Elgie Collard M.D.   On: 11/17/2014 22:50     EKG Interpretation   Date/Time:  Saturday November 17 2014 21:53:42 EDT Ventricular Rate:  117 PR Interval:    QRS Duration: 98 QT Interval:  325 QTC Calculation: 453 R Axis:   -66 Text Interpretation:  Atrial fibrillation Paired ventricular premature  complexes Left anterior fascicular block Abnormal R-wave  progression,  early transition Minimal ST depression, anterolateral leads Confirmed by  Lincoln Brigham [phone]) on 11/17/2014 10:12:41 PM      MDM   Final diagnoses:  None    On initial evaluation patient was tachycardic to the 120s but with normal blood pressure. Patient was somnolent on exam but would answer questions. Minor leak confuse. No focal neurologic deficits seen. Patient with mildly tender abdomen.  Chest x-ray performed showing no acute cardiopulmonary processes. Lactic acid elevated at 2.7. WBC 17.8. Gentle fluids given in the ED. Patient placed on Cardizem drip with heart rates in the 110hs. Mildly elevated troponin at 0.04. With known heart disease is probably due to strain with the tachycardia. No acute ischemic changes on EKG. Doubt ACS. Urinary tract infection present on UA. Patient started on broad-spectrum antibiotics in the emergency department. Critical care consult at for admission of the patient for sepsis and A. fib with RVR. CT scan of the abdomen was pending upon consultation of critical care.  Critical care found not necessary for ICU at this point and Dr. Allena Katz with Hospitalist service consulted for admission to step down.    If performed, labs, EKGs, and imaging were reviewed/interpreted by myself and my attending and incorporated into medical decision making.  Discussed pertinent finding with patient or caregiver prior to admission with no further questions.  Pt care supervised by my attending Dr. Manson Allan, MD PGY-2  Emergency Medicine     Tery Sanfilippo, MD 11/18/14 1303  Tilden Fossa, MD 11/18/14 [phone]

## 2014-11-18 ENCOUNTER — Inpatient Hospital Stay (HOSPITAL_COMMUNITY): Payer: Medicare Other

## 2014-11-18 ENCOUNTER — Other Ambulatory Visit: Payer: Self-pay | Admitting: Pulmonary Disease

## 2014-11-18 ENCOUNTER — Emergency Department (HOSPITAL_COMMUNITY): Payer: Medicare Other

## 2014-11-18 ENCOUNTER — Encounter (HOSPITAL_COMMUNITY): Payer: Self-pay

## 2014-11-18 DIAGNOSIS — Z951 Presence of aortocoronary bypass graft: Secondary | ICD-10-CM | POA: Diagnosis not present

## 2014-11-18 DIAGNOSIS — J410 Simple chronic bronchitis: Secondary | ICD-10-CM

## 2014-11-18 DIAGNOSIS — I10 Essential (primary) hypertension: Secondary | ICD-10-CM | POA: Diagnosis present

## 2014-11-18 DIAGNOSIS — E872 Acidosis: Secondary | ICD-10-CM | POA: Diagnosis present

## 2014-11-18 DIAGNOSIS — R932 Abnormal findings on diagnostic imaging of liver and biliary tract: Secondary | ICD-10-CM | POA: Diagnosis not present

## 2014-11-18 DIAGNOSIS — Z880 Allergy status to penicillin: Secondary | ICD-10-CM | POA: Diagnosis not present

## 2014-11-18 DIAGNOSIS — D649 Anemia, unspecified: Secondary | ICD-10-CM | POA: Diagnosis present

## 2014-11-18 DIAGNOSIS — E876 Hypokalemia: Secondary | ICD-10-CM | POA: Diagnosis present

## 2014-11-18 DIAGNOSIS — I480 Paroxysmal atrial fibrillation: Secondary | ICD-10-CM | POA: Diagnosis present

## 2014-11-18 DIAGNOSIS — Z87891 Personal history of nicotine dependence: Secondary | ICD-10-CM | POA: Diagnosis not present

## 2014-11-18 DIAGNOSIS — K869 Disease of pancreas, unspecified: Secondary | ICD-10-CM | POA: Diagnosis present

## 2014-11-18 DIAGNOSIS — I509 Heart failure, unspecified: Secondary | ICD-10-CM | POA: Diagnosis not present

## 2014-11-18 DIAGNOSIS — N390 Urinary tract infection, site not specified: Secondary | ICD-10-CM | POA: Diagnosis present

## 2014-11-18 DIAGNOSIS — Z8249 Family history of ischemic heart disease and other diseases of the circulatory system: Secondary | ICD-10-CM | POA: Diagnosis not present

## 2014-11-18 DIAGNOSIS — Z9981 Dependence on supplemental oxygen: Secondary | ICD-10-CM | POA: Diagnosis not present

## 2014-11-18 DIAGNOSIS — I878 Other specified disorders of veins: Secondary | ICD-10-CM | POA: Diagnosis present

## 2014-11-18 DIAGNOSIS — I340 Nonrheumatic mitral (valve) insufficiency: Secondary | ICD-10-CM | POA: Diagnosis present

## 2014-11-18 DIAGNOSIS — A4152 Sepsis due to Pseudomonas: Secondary | ICD-10-CM | POA: Diagnosis present

## 2014-11-18 DIAGNOSIS — R4182 Altered mental status, unspecified: Secondary | ICD-10-CM | POA: Diagnosis present

## 2014-11-18 DIAGNOSIS — J449 Chronic obstructive pulmonary disease, unspecified: Secondary | ICD-10-CM | POA: Diagnosis present

## 2014-11-18 DIAGNOSIS — E785 Hyperlipidemia, unspecified: Secondary | ICD-10-CM | POA: Diagnosis present

## 2014-11-18 DIAGNOSIS — R7401 Elevation of levels of liver transaminase levels: Secondary | ICD-10-CM | POA: Diagnosis present

## 2014-11-18 DIAGNOSIS — F0390 Unspecified dementia without behavioral disturbance: Secondary | ICD-10-CM | POA: Diagnosis present

## 2014-11-18 DIAGNOSIS — R630 Anorexia: Secondary | ICD-10-CM | POA: Diagnosis present

## 2014-11-18 DIAGNOSIS — R0602 Shortness of breath: Secondary | ICD-10-CM | POA: Diagnosis not present

## 2014-11-18 DIAGNOSIS — I4891 Unspecified atrial fibrillation: Secondary | ICD-10-CM | POA: Diagnosis not present

## 2014-11-18 DIAGNOSIS — Z853 Personal history of malignant neoplasm of breast: Secondary | ICD-10-CM | POA: Diagnosis not present

## 2014-11-18 DIAGNOSIS — E119 Type 2 diabetes mellitus without complications: Secondary | ICD-10-CM | POA: Diagnosis present

## 2014-11-18 DIAGNOSIS — R740 Nonspecific elevation of levels of transaminase and lactic acid dehydrogenase [LDH]: Secondary | ICD-10-CM | POA: Diagnosis present

## 2014-11-18 DIAGNOSIS — Z882 Allergy status to sulfonamides status: Secondary | ICD-10-CM | POA: Diagnosis not present

## 2014-11-18 DIAGNOSIS — I712 Thoracic aortic aneurysm, without rupture: Secondary | ICD-10-CM | POA: Diagnosis present

## 2014-11-18 DIAGNOSIS — A419 Sepsis, unspecified organism: Secondary | ICD-10-CM | POA: Diagnosis not present

## 2014-11-18 DIAGNOSIS — R7989 Other specified abnormal findings of blood chemistry: Secondary | ICD-10-CM | POA: Diagnosis not present

## 2014-11-18 DIAGNOSIS — I2510 Atherosclerotic heart disease of native coronary artery without angina pectoris: Secondary | ICD-10-CM | POA: Diagnosis present

## 2014-11-18 DIAGNOSIS — Z79899 Other long term (current) drug therapy: Secondary | ICD-10-CM | POA: Diagnosis not present

## 2014-11-18 DIAGNOSIS — E860 Dehydration: Secondary | ICD-10-CM | POA: Diagnosis present

## 2014-11-18 DIAGNOSIS — K831 Obstruction of bile duct: Secondary | ICD-10-CM | POA: Diagnosis not present

## 2014-11-18 DIAGNOSIS — Z885 Allergy status to narcotic agent status: Secondary | ICD-10-CM | POA: Diagnosis not present

## 2014-11-18 DIAGNOSIS — Z955 Presence of coronary angioplasty implant and graft: Secondary | ICD-10-CM | POA: Diagnosis not present

## 2014-11-18 DIAGNOSIS — Z888 Allergy status to other drugs, medicaments and biological substances status: Secondary | ICD-10-CM | POA: Diagnosis not present

## 2014-11-18 DIAGNOSIS — Z7982 Long term (current) use of aspirin: Secondary | ICD-10-CM | POA: Diagnosis not present

## 2014-11-18 DIAGNOSIS — G9340 Encephalopathy, unspecified: Secondary | ICD-10-CM | POA: Diagnosis not present

## 2014-11-18 DIAGNOSIS — J9601 Acute respiratory failure with hypoxia: Secondary | ICD-10-CM

## 2014-11-18 DIAGNOSIS — K219 Gastro-esophageal reflux disease without esophagitis: Secondary | ICD-10-CM | POA: Diagnosis present

## 2014-11-18 DIAGNOSIS — K5900 Constipation, unspecified: Secondary | ICD-10-CM | POA: Diagnosis present

## 2014-11-18 DIAGNOSIS — Z794 Long term (current) use of insulin: Secondary | ICD-10-CM | POA: Diagnosis not present

## 2014-11-18 DIAGNOSIS — J439 Emphysema, unspecified: Secondary | ICD-10-CM | POA: Diagnosis not present

## 2014-11-18 DIAGNOSIS — D696 Thrombocytopenia, unspecified: Secondary | ICD-10-CM | POA: Diagnosis present

## 2014-11-18 DIAGNOSIS — J9610 Chronic respiratory failure, unspecified whether with hypoxia or hypercapnia: Secondary | ICD-10-CM | POA: Diagnosis present

## 2014-11-18 DIAGNOSIS — I714 Abdominal aortic aneurysm, without rupture: Secondary | ICD-10-CM | POA: Diagnosis present

## 2014-11-18 DIAGNOSIS — I5032 Chronic diastolic (congestive) heart failure: Secondary | ICD-10-CM | POA: Diagnosis present

## 2014-11-18 LAB — COMPREHENSIVE METABOLIC PANEL
ALK PHOS: 230 U/L — AB (ref 38–126)
ALT: 300 U/L — ABNORMAL HIGH (ref 14–54)
AST: 223 U/L — ABNORMAL HIGH (ref 15–41)
Albumin: 2.9 g/dL — ABNORMAL LOW (ref 3.5–5.0)
Anion gap: 10 (ref 5–15)
BUN: 13 mg/dL (ref 6–20)
CO2: 32 mmol/L (ref 22–32)
Calcium: 9.2 mg/dL (ref 8.9–10.3)
Chloride: 89 mmol/L — ABNORMAL LOW (ref 101–111)
Creatinine, Ser: 1.26 mg/dL — ABNORMAL HIGH (ref 0.44–1.00)
GFR calc non Af Amer: 38 mL/min — ABNORMAL LOW (ref >60)
GFR, EST AFRICAN AMERICAN: 44 mL/min — AB (ref >60)
GLUCOSE: 177 mg/dL — AB (ref 65–99)
Potassium: 3.4 mmol/L — ABNORMAL LOW (ref 3.5–5.1)
Sodium: 131 mmol/L — ABNORMAL LOW (ref 135–145)
Total Bilirubin: 4.1 mg/dL — ABNORMAL HIGH (ref 0.3–1.2)
Total Protein: 5.7 g/dL — ABNORMAL LOW (ref 6.5–8.1)

## 2014-11-18 LAB — CREATININE, URINE, RANDOM: Creatinine, Urine: 149.81 mg/dL

## 2014-11-18 LAB — URINALYSIS, ROUTINE W REFLEX MICROSCOPIC
Glucose, UA: NEGATIVE mg/dL
HGB URINE DIPSTICK: NEGATIVE
KETONES UR: 15 mg/dL — AB
Nitrite: POSITIVE — AB
PROTEIN: 30 mg/dL — AB
Specific Gravity, Urine: 1.036 — ABNORMAL HIGH (ref 1.005–1.030)
Urobilinogen, UA: 1 mg/dL (ref 0.0–1.0)
pH: 7 (ref 5.0–8.0)

## 2014-11-18 LAB — CBC WITH DIFFERENTIAL/PLATELET
BASOS ABS: 0 10*3/uL (ref 0.0–0.1)
Basophils Relative: 0 % (ref 0–1)
Eosinophils Absolute: 0 10*3/uL (ref 0.0–0.7)
Eosinophils Relative: 0 % (ref 0–5)
HEMATOCRIT: 30.3 % — AB (ref 36.0–46.0)
HEMOGLOBIN: 9.9 g/dL — AB (ref 12.0–15.0)
LYMPHS ABS: 0.2 10*3/uL — AB (ref 0.7–4.0)
LYMPHS PCT: 1 % — AB (ref 12–46)
MCH: 28.7 pg (ref 26.0–34.0)
MCHC: 32.7 g/dL (ref 30.0–36.0)
MCV: 87.8 fL (ref 78.0–100.0)
MONO ABS: 1.1 10*3/uL — AB (ref 0.1–1.0)
Monocytes Relative: 6 % (ref 3–12)
NEUTROS ABS: 16.9 10*3/uL — AB (ref 1.7–7.7)
Neutrophils Relative %: 93 % — ABNORMAL HIGH (ref 43–77)
Platelets: 155 10*3/uL (ref 150–400)
RBC: 3.45 MIL/uL — AB (ref 3.87–5.11)
RDW: 14 % (ref 11.5–15.5)
WBC: 18.2 10*3/uL — AB (ref 4.0–10.5)

## 2014-11-18 LAB — TROPONIN I
Troponin I: 0.12 ng/mL — ABNORMAL HIGH (ref <0.031)
Troponin I: 0.04 ng/mL — ABNORMAL HIGH (ref <0.031)
Troponin I: 0.04 ng/mL — ABNORMAL HIGH (ref <0.031)

## 2014-11-18 LAB — SODIUM, URINE, RANDOM: SODIUM UR: 13 mmol/L

## 2014-11-18 LAB — GLUCOSE, CAPILLARY
GLUCOSE-CAPILLARY: 131 mg/dL — AB (ref 65–99)
Glucose-Capillary: 128 mg/dL — ABNORMAL HIGH (ref 65–99)
Glucose-Capillary: 107 mg/dL — ABNORMAL HIGH (ref 65–99)
Glucose-Capillary: 115 mg/dL — ABNORMAL HIGH (ref 65–99)

## 2014-11-18 LAB — APTT: aPTT: 27 seconds (ref 24–37)

## 2014-11-18 LAB — I-STAT CG4 LACTIC ACID, ED: Lactic Acid, Venous: 2.12 mmol/L (ref 0.5–2.0)

## 2014-11-18 LAB — URINE MICROSCOPIC-ADD ON

## 2014-11-18 LAB — PROTIME-INR
INR: 1.32 (ref 0.00–1.49)
Prothrombin Time: 16.5 seconds — ABNORMAL HIGH (ref 11.6–15.2)

## 2014-11-18 LAB — MRSA PCR SCREENING: MRSA by PCR: NEGATIVE

## 2014-11-18 LAB — TSH: TSH: 0.702 u[IU]/mL (ref 0.350–4.500)

## 2014-11-18 LAB — OSMOLALITY, URINE: Osmolality, Ur: 495 mOsm/kg (ref 390–1090)

## 2014-11-18 LAB — URIC ACID: URIC ACID, SERUM: 7.2 mg/dL — AB (ref 2.3–6.6)

## 2014-11-18 LAB — OSMOLALITY: Osmolality: 275 mOsm/kg (ref 275–300)

## 2014-11-18 LAB — PROCALCITONIN: Procalcitonin: 2.02 ng/mL

## 2014-11-18 MED ORDER — IOHEXOL 300 MG/ML  SOLN
80.0000 mL | Freq: Once | INTRAMUSCULAR | Status: AC | PRN
  Administered 2014-11-18: 80 mL via INTRAVENOUS

## 2014-11-18 MED ORDER — ONDANSETRON HCL 4 MG/2ML IJ SOLN
4.0000 mg | Freq: Four times a day (QID) | INTRAMUSCULAR | Status: DC | PRN
  Administered 2014-11-18 – 2014-11-20 (×3): 4 mg via INTRAVENOUS
  Filled 2014-11-18 (×3): qty 2

## 2014-11-18 MED ORDER — ACETAMINOPHEN 325 MG PO TABS
650.0000 mg | ORAL_TABLET | Freq: Four times a day (QID) | ORAL | Status: DC | PRN
  Administered 2014-11-18: 650 mg via ORAL
  Administered 2014-11-21: 325 mg via ORAL
  Filled 2014-11-18: qty 2

## 2014-11-18 MED ORDER — ACETAMINOPHEN 650 MG RE SUPP: 650.0000 mg | Freq: Four times a day (QID) | RECTAL | Status: DC | PRN

## 2014-11-18 MED ORDER — CETYLPYRIDINIUM CHLORIDE 0.05 % MT LIQD
7.0000 mL | Freq: Two times a day (BID) | OROMUCOSAL | Status: DC
  Administered 2014-11-18 – 2014-11-23 (×9): 7 mL via OROMUCOSAL

## 2014-11-18 MED ORDER — METRONIDAZOLE IN NACL 5-0.79 MG/ML-% IV SOLN
500.0000 mg | Freq: Three times a day (TID) | INTRAVENOUS | Status: DC
  Administered 2014-11-18 – 2014-11-20 (×6): 500 mg via INTRAVENOUS
  Filled 2014-11-18 (×8): qty 100

## 2014-11-18 MED ORDER — SODIUM CHLORIDE 0.9 % IJ SOLN
3.0000 mL | Freq: Two times a day (BID) | INTRAMUSCULAR | Status: DC
  Administered 2014-11-18 – 2014-11-21 (×5): 3 mL via INTRAVENOUS
  Administered 2014-11-22: 10 mL via INTRAVENOUS

## 2014-11-18 MED ORDER — DILTIAZEM HCL ER COATED BEADS 300 MG PO CP24
300.0000 mg | ORAL_CAPSULE | Freq: Every day | ORAL | Status: DC
  Administered 2014-11-18: 300 mg via ORAL
  Filled 2014-11-18 (×2): qty 1

## 2014-11-18 MED ORDER — ALPRAZOLAM 0.25 MG PO TABS: 0.2500 mg | ORAL_TABLET | Freq: Every evening | ORAL | Status: DC | PRN

## 2014-11-18 MED ORDER — TIOTROPIUM BROMIDE MONOHYDRATE 18 MCG IN CAPS
1.0000 | ORAL_CAPSULE | Freq: Every day | RESPIRATORY_TRACT | Status: DC
  Administered 2014-11-18 – 2014-11-23 (×5): 18 ug via RESPIRATORY_TRACT
  Filled 2014-11-18 (×2): qty 5

## 2014-11-18 MED ORDER — VANCOMYCIN HCL IN DEXTROSE 1-5 GM/200ML-% IV SOLN: 1000.0000 mg | Freq: Once | INTRAVENOUS | Status: DC

## 2014-11-18 MED ORDER — LEVOFLOXACIN IN D5W 750 MG/150ML IV SOLN
750.0000 mg | Freq: Once | INTRAVENOUS | Status: AC
  Administered 2014-11-18: 750 mg via INTRAVENOUS
  Filled 2014-11-18: qty 150

## 2014-11-18 MED ORDER — INSULIN ASPART 100 UNIT/ML ~~LOC~~ SOLN
0.0000 [IU] | Freq: Every day | SUBCUTANEOUS | Status: DC
  Administered 2014-11-21 – 2014-11-22 (×2): 2 [IU] via SUBCUTANEOUS

## 2014-11-18 MED ORDER — BUDESONIDE-FORMOTEROL FUMARATE 160-4.5 MCG/ACT IN AERO: 2.0000 | INHALATION_SPRAY | Freq: Two times a day (BID) | RESPIRATORY_TRACT | Status: DC

## 2014-11-18 MED ORDER — METOPROLOL SUCCINATE ER 25 MG PO TB24
25.0000 mg | ORAL_TABLET | Freq: Every day | ORAL | Status: DC
  Administered 2014-11-18 – 2014-11-23 (×6): 25 mg via ORAL
  Filled 2014-11-18 (×6): qty 1

## 2014-11-18 MED ORDER — DEXTROSE 5 % IV SOLN
1.0000 g | Freq: Two times a day (BID) | INTRAVENOUS | Status: DC
  Administered 2014-11-18 – 2014-11-19 (×4): 1 g via INTRAVENOUS
  Filled 2014-11-18 (×5): qty 1

## 2014-11-18 MED ORDER — ATORVASTATIN CALCIUM 20 MG PO TABS
20.0000 mg | ORAL_TABLET | Freq: Every day | ORAL | Status: DC
  Administered 2014-11-18 – 2014-11-22 (×5): 20 mg via ORAL
  Filled 2014-11-18 (×6): qty 1

## 2014-11-18 MED ORDER — HEPARIN SODIUM (PORCINE) 5000 UNIT/ML IJ SOLN
5000.0000 [IU] | Freq: Three times a day (TID) | INTRAMUSCULAR | Status: AC
  Administered 2014-11-18 – 2014-11-19 (×5): 5000 [IU] via SUBCUTANEOUS
  Filled 2014-11-18 (×7): qty 1

## 2014-11-18 MED ORDER — PANTOPRAZOLE SODIUM 40 MG PO TBEC
40.0000 mg | DELAYED_RELEASE_TABLET | Freq: Every day | ORAL | Status: DC
  Administered 2014-11-18 – 2014-11-23 (×6): 40 mg via ORAL
  Filled 2014-11-18 (×6): qty 1

## 2014-11-18 MED ORDER — DEXTROSE 5 % IV SOLN
5.0000 mg/h | INTRAVENOUS | Status: DC
  Administered 2014-11-18: 15 mg/h via INTRAVENOUS
  Filled 2014-11-18: qty 100

## 2014-11-18 MED ORDER — LORAZEPAM 2 MG/ML IJ SOLN
1.0000 mg | Freq: Once | INTRAMUSCULAR | Status: AC
  Administered 2014-11-18: 1 mg via INTRAVENOUS
  Filled 2014-11-18: qty 1

## 2014-11-18 MED ORDER — POTASSIUM CHLORIDE CRYS ER 20 MEQ PO TBCR
40.0000 meq | EXTENDED_RELEASE_TABLET | Freq: Once | ORAL | Status: AC
  Administered 2014-11-18: 40 meq via ORAL
  Filled 2014-11-18: qty 2

## 2014-11-18 MED ORDER — VANCOMYCIN HCL IN DEXTROSE 1-5 GM/200ML-% IV SOLN
1000.0000 mg | INTRAVENOUS | Status: DC
  Administered 2014-11-18: 1000 mg via INTRAVENOUS
  Filled 2014-11-18: qty 200

## 2014-11-18 MED ORDER — BUDESONIDE-FORMOTEROL FUMARATE 160-4.5 MCG/ACT IN AERO
2.0000 | INHALATION_SPRAY | Freq: Two times a day (BID) | RESPIRATORY_TRACT | Status: DC
  Administered 2014-11-18 – 2014-11-22 (×9): 2 via RESPIRATORY_TRACT
  Filled 2014-11-18 (×2): qty 6

## 2014-11-18 MED ORDER — ONDANSETRON HCL 4 MG PO TABS: 4.0000 mg | ORAL_TABLET | Freq: Four times a day (QID) | ORAL | Status: DC | PRN

## 2014-11-18 MED ORDER — POTASSIUM CHLORIDE IN NACL 40-0.9 MEQ/L-% IV SOLN
INTRAVENOUS | Status: AC
  Administered 2014-11-18 (×2): 75 mL/h via INTRAVENOUS
  Filled 2014-11-18 (×2): qty 1000

## 2014-11-18 MED ORDER — INSULIN ASPART 100 UNIT/ML ~~LOC~~ SOLN
0.0000 [IU] | Freq: Three times a day (TID) | SUBCUTANEOUS | Status: DC
  Administered 2014-11-18 – 2014-11-19 (×4): 2 [IU] via SUBCUTANEOUS
  Administered 2014-11-19: 8 [IU] via SUBCUTANEOUS
  Administered 2014-11-20 (×2): 5 [IU] via SUBCUTANEOUS
  Administered 2014-11-21 (×2): 3 [IU] via SUBCUTANEOUS
  Administered 2014-11-21: 2 [IU] via SUBCUTANEOUS
  Administered 2014-11-22: 5 [IU] via SUBCUTANEOUS
  Administered 2014-11-22: 8 [IU] via SUBCUTANEOUS
  Administered 2014-11-22 – 2014-11-23 (×2): 5 [IU] via SUBCUTANEOUS

## 2014-11-18 MED ORDER — ASPIRIN EC 81 MG PO TBEC
81.0000 mg | DELAYED_RELEASE_TABLET | Freq: Every day | ORAL | Status: DC
  Administered 2014-11-18 – 2014-11-23 (×6): 81 mg via ORAL
  Filled 2014-11-18 (×6): qty 1

## 2014-11-18 NOTE — ED Notes (Signed)
MD at bedside.critical; care

## 2014-11-18 NOTE — ED Notes (Addendum)
Pericare done on pt.

## 2014-11-18 NOTE — Progress Notes (Signed)
Pt troponin 0.12 and K 3.4.  [facility] notified with results via text page.  Pt denies chest pain, only c/o headache.  Will continue to monitor closely.

## 2014-11-18 NOTE — ED Notes (Signed)
Dr. Patel at bedside 

## 2014-11-18 NOTE — ED Notes (Signed)
Returned from ct 

## 2014-11-18 NOTE — Consult Note (Signed)
Name: [name] MRN: [id number] DOB: 04/22/1930    ADMISSION DATE:  11/17/2014 CONSULTATION DATE:  11/18/2014  REFERRING MD :  Emergency department  CHIEF COMPLAINT:  SOB/DOE  HISTORY OF PRESENT ILLNESS:   81F with h/o Afib and COPD here with SOB and DOE x several days.  She is a poor historian but states that she has been having trouble breathing for years.  Her Daughter-in-law brought her to the ED though she does not know why.  She reports some SOB but has not turned up her O2 or used her albuterol more frequently then usual.  She reports nausea but no vomiting or diarrhea.  She simply reports feeling "unwell" but is unable to elaborate.  PAST MEDICAL HISTORY :   has a past medical history of CAD (coronary artery disease); Hypertension; Hyperlipidemia; GI bleed; Aneurysm, aortic; Syncope; Hyponatremia; COPD (chronic obstructive pulmonary disease); Tobacco abuse; Bradycardia; Carotid artery disease; Tuberculosis; Atrial fibrillation; Venous stasis of lower extremity; Ejection fraction; CHF with left ventricular diastolic dysfunction, NYHA class 2; AAA (abdominal aortic aneurysm); On home O2; Complication of anesthesia; Shortness of breath; CABG; Hematoma; Mitral regurgitation; and Leg ulcer.  has past surgical history that includes Cholecystectomy; carotid endartercetomy; Abdominal hysterectomy; Coronary artery bypass graft (2005); Coronary angioplasty with stent (07/18/12); left heart catheterization with coronary angiogram (N/A, 07/18/2012); and percutaneous coronary stent intervention (pci-s) (07/18/2012). Prior to Admission medications   Medication Sig Start Date End Date Taking? Authorizing Provider  albuterol (PROVENTIL HFA;VENTOLIN HFA) 108 (90 BASE) MCG/ACT inhaler Inhale 2 puffs into the lungs every 4 (four) hours as needed for wheezing or shortness of breath. 07/16/14   Tammy S Parrett, NP  albuterol (PROVENTIL) (2.5 MG/3ML) 0.083% nebulizer solution Take 2.5 mg by nebulization  every 6 (six) hours as needed for wheezing.    Historical Provider, MD  ALPRAZolam Prudy Feeler) 0.25 MG tablet Take 0.25 mg by mouth at bedtime as needed for anxiety.    Historical Provider, MD  aspirin EC 81 MG tablet Take 81 mg by mouth daily.    Historical Provider, MD  atorvastatin (LIPITOR) 40 MG tablet Take 0.5 tablets (20 mg total) by mouth daily at 6 PM. 11/07/13   Rosalio Macadamia, NP  budesonide-formoterol (SYMBICORT) 160-4.5 MCG/ACT inhaler Inhale 2 puffs into the lungs 2 (two) times daily. 06/14/14   Oretha Milch, MD  CARTIA XT 300 MG 24 hr capsule TAKE 1 CAPSULE DAILY 10/25/14   Luis Abed, MD  cephALEXin (KEFLEX) 500 MG capsule Take 1 capsule (500 mg total) by mouth 4 (four) times daily. 10/28/14   Mancel Bale, MD  furosemide (LASIX) 40 MG tablet Take 1 tablet (40 mg total) by mouth 2 (two) times daily. 08/29/13   Luis Abed, MD  insulin aspart protamine- aspart (NOVOLOG MIX 70/30) (70-30) 100 UNIT/ML injection Inject into the skin.    Historical Provider, MD  magnesium oxide (MAG-OX) 400 MG tablet Take 400 mg by mouth daily.    Historical Provider, MD  metFORMIN (GLUCOPHAGE) 500 MG tablet Take 500 mg by mouth 2 (two) times daily with a meal.    Historical Provider, MD  metoprolol succinate (TOPROL-XL) 25 MG 24 hr tablet Take 1 tablet (25 mg total) by mouth daily. 11/24/13   Luis Abed, MD  Multiple Vitamins-Minerals (PRESERVISION AREDS 2 PO) Take 1 tablet by mouth 2 (two) times daily.    Historical Provider, MD  nitroGLYCERIN (NITROSTAT) 0.4 MG SL tablet Place 0.4 mg under the tongue every 5 (five) minutes  as needed for chest pain.    Historical Provider, MD  ondansetron (ZOFRAN) 8 MG tablet Take 1 tablet (8 mg total) by mouth every 8 (eight) hours as needed for nausea or vomiting. 10/28/14   Mancel Bale, MD  OXYGEN Inhale into the lungs. 2L continously    Historical Provider, MD  pantoprazole (PROTONIX) 40 MG tablet Take 40 mg by mouth daily.    Historical Provider, MD  potassium  chloride SA (K-DUR,KLOR-CON) 20 MEQ tablet Take 20 mEq by mouth 2 (two) times daily.    Historical Provider, MD  SPIRIVA HANDIHALER 18 MCG inhalation capsule INHALE THE CONTENTS OF 1 CAPSULE DAILY 09/10/14   Oretha Milch, MD  Tiotropium Bromide Monohydrate (SPIRIVA RESPIMAT) 2.5 MCG/ACT AERS Inhale 2 puffs into the lungs daily. 07/16/14   Tammy S Parrett, NP  Vitamin D, Ergocalciferol, (DRISDOL) 50000 UNITS CAPS Take 50,000 Units by mouth every 14 (fourteen) days. Every other Sunday    Historical Provider, MD   Allergies  Allergen Reactions  . Other Other (See Comments)    TETANUS-can only take 1/2 dose at one time, can take the other 1/2 dose about 3 days later  . Codeine Nausea And Vomiting  . Sulfonamide Derivatives Nausea And Vomiting  . Warfarin     H/o AVM's prechief complaintcluding anticoagulation  . Penicillins Itching, Rash and Other (See Comments)    Nasal itching   . Ranitidine Hcl Itching and Rash    FAMILY HISTORY:  family history includes Cancer in her brother and sister; Heart failure in her mother; Stroke in her sister. SOCIAL HISTORY:  reports that she quit smoking about 21 months ago. Her smoking use included Cigarettes. She has a 29.5 pack-year smoking history. She does not have any smokeless tobacco history on file. She reports that she does not drink alcohol.  REVIEW OF SYSTEMS:   Constitutional: Negative for fever, chills, weight loss, malaise/fatigue and diaphoresis.  HENT: Negative for hearing loss, ear pain, nosebleeds, congestion, sore throat, neck pain, tinnitus and ear discharge.   Eyes: Negative for blurred vision, double vision, photophobia, pain, discharge and redness.  Respiratory: Negative for cough, hemoptysis, sputum production, shortness of breath, wheezing and stridor.   Cardiovascular: Negative for chest pain, palpitations, orthopnea, claudication, leg swelling and PND.  Gastrointestinal: Negative for heartburn, nausea, vomiting, abdominal pain,  diarrhea, constipation, blood in stool and melena.  Genitourinary: Negative for dysuria, urgency, frequency, hematuria and flank pain.  Musculoskeletal: Negative for myalgias, back pain, joint pain and falls.  Skin: Negative for itching and rash.  Neurological: Negative for dizziness, tingling, tremors, sensory change, speech change, focal weakness, seizures, loss of consciousness, weakness and headaches.  Endo/Heme/Allergies: Negative for environmental allergies and polydipsia. Does not bruise/bleed easily.  SUBJECTIVE:   VITAL SIGNS: Temp:  [101.9 F (38.8 C)] 101.9 F (38.8 C) (07/23 2317) Pulse Rate:  [96-146] 117 (07/24 0100) Resp:  [20-26] 22 (07/24 0100) BP: (93-141)/(64-92) 124/77 mmHg (07/24 0100) SpO2:  [89 %-100 %] 89 % (07/24 0100) Weight:  [83.462 kg (184 lb)] 83.462 kg (184 lb) (07/23 2203)  PHYSICAL EXAMINATION: General:  NAD Neuro:  AAOx3, CN II-XII intact HEENT:  Poor dentition, NCAT Cardiovascular:  Irregular rhythm, tachycardic, no m/r/g Lungs:  CTA b/l no w/r/r.  Globally decreased breath sounds. Abdomen:  Soft, normal bowel sounds.  Significant RUQ and LLQ TTP.  No rebound or gaurding, negative murphy sign Musculoskeletal:  Moves all extremities Skin:  Psoriatic rash with open lesions on b/l forearms as well as anterior LLE.  LE: no c/c/e   Recent Labs Lab 11/17/14 2222  NA 133*  K 3.4*  CL 87*  CO2 33*  BUN 12  CREATININE 1.22*  GLUCOSE 189*    Recent Labs Lab 11/17/14 2222  HGB 11.9*  HCT 36.2  WBC 17.9*  PLT 192   Ct Abdomen Pelvis W Contrast  11/18/2014   CLINICAL DATA:  81 year old female with fever and abdominal pain.  EXAM: CT ABDOMEN AND PELVIS WITH CONTRAST  TECHNIQUE: Multidetector CT imaging of the abdomen and pelvis was performed using the standard protocol following bolus administration of intravenous contrast.  CONTRAST:  80mL OMNIPAQUE IOHEXOL 300 MG/ML  SOLN  COMPARISON:  CT dated 05/04/2014 and ultrasound dated 10/28/2014   FINDINGS: Evaluation is limited due to streak artifact caused by patient's arms and metallic left hip arthroplasty.  Emphysematous changes of the lung bases. There is coronary vascular calcification. No intra-abdominal free air or free fluid.  There is mild hepatic surface irregularity. A 1.6 x 1.0 cm hypodense lesion in the inferior right lobe of the liver (series 201 image 37) is not well characterized. MRI may provide better evaluation. Cholecystectomy. The pancreas is atrophic. There is an ill-defined soft tissue prominence involving the head of the pancreas at the ampulla of Vater. This area is not well evaluated on the CT. The the capillary mass or pancreatic lesion is not excluded. Endoscopic ultrasound is recommended for better evaluation. The spleen, adrenal glands appear unremarkable. There is moderate bilateral renal atrophy. There is no hydronephrosis. The visualized ureters and urinary bladder appear unremarkable. Hysterectomy.  Extensive sigmoid diverticulosis with muscular hypertrophy. No definite active inflammatory changes. Moderate stool in the distal colon. No evidence of bowel obstruction. There is a 1.6 cm duodenal diverticulum. There is soft tissue density along the medial wall of the second portion of the duodenum (series 201, image 35). Normal appendix.  There is aortoiliac atherosclerotic disease. A 3.2 cm infrarenal abdominal aortic aneurysm. The origins of the celiac axis, SMA, IMA as well as the origins of the renal arteries appear patent. No portal venous gas identified. Ill-defined nodular soft tissue density in the region of the porta hepatis likely represent lymph nodes. There is diastases of anterior abdominal wall musculature in the midline with a small fat containing umbilical hernia. Degenerative changes of the spine. Left hip arthroplasty. No acute fracture.  IMPRESSION: Ill-defined soft tissue thickness involving the head and uncinate process of the pancreas as well as the  medial wall of the second portion of the duodenum. MRI or endoscopic ultrasound is recommended for further evaluation.  Sigmoid diverticulosis. No evidence of bowel obstruction or inflammation. Normal appendix.  Incompletely characterized hypodense lesion along the inferior surface of the right lobe of the liver. MRI may provide better characterization.   Electronically Signed   By: Elgie Collard M.D.   On: 11/18/2014 01:03   Dg Chest Portable 1 View  11/17/2014   CLINICAL DATA:  81 year old female with shortness of breath and tachycardia.  EXAM: PORTABLE CHEST - 1 VIEW  COMPARISON:  Radiograph dated 10/28/2014  FINDINGS: Single-view of the chest demonstrate emphysematous changes of the lungs. There is no focal consolidation, pleural effusion, or pneumothorax. The cardiomediastinal silhouette is within normal limits. Degenerative changes of the osseous structures.  IMPRESSION: Emphysema.  No focal consolidation or pneumothorax.   Electronically Signed   By: Elgie Collard M.D.   On: 11/17/2014 22:50    ASSESSMENT / PLAN:  89F with Afib and RVR likely secondary to acute  cholecystitis vs ascending cholangitis.  Currently she is on dilt gtt on fixed rate and is rate controlled, hemodynamically stable.  She does not appear to be decompensated from a respiratory perspective as she is on her home O2 without adventitious breath sounds.  Recommend: - Stable for stepdown, no ICU requirements at this time. - cont dilt gtt - treatment of underlying intra-abdominal process per primary team - continue home symbicort 160-4.5 2 puff BID - continue Tiotropium - would hold albuterol as she is not decompensated at this time and this may worsen her HR - continue supplemental O2 PRN for O2 Sat <88%  Total critical care time: 30 min  Critical care time was exclusive of separately billable procedures and treating other patients.  Critical care was necessary to treat or prevent imminent or life-threatening  deterioration.  Critical care was time spent personally by me on the following activities: development of treatment plan with patient and/or surrogate as well as nursing, discussions with consultants, evaluation of patient's response to treatment, examination of patient, obtaining history from patient or surrogate, ordering and performing treatments and interventions, ordering and review of laboratory studies, ordering and review of radiographic studies, pulse oximetry and re-evaluation of patient's condition.   Galvin Proffer, DO., MS [facility]    Pulmonary and Critical Care Medicine [facility] Pager: [phone]  11/18/2014, 1:45 AM

## 2014-11-18 NOTE — ED Notes (Signed)
Patient transported to CT 

## 2014-11-18 NOTE — Consult Note (Signed)
[facility] Consult: 9:18 AM 11/18/2014  LOS: 0 days    Referring Provider: Dr Candiss Norse  Primary Care Physician:  Leonard Downing, MD Primary Gastroenterologist:  Dr. Deatra Ina      Reason for Consultation:  Pancreatic/duodenal mass and elevated LFTs.    HPI: [name] is a 81 y.o. female.  O2 dependent COPD. CAD, diastolic dysfunction.  S/p CABG2005 and stent 06/2012.  Parox A fib.  4.1 cm thoracic aortic aneurysm.  IDDM.  S/p cholecystectomy. ERCP/sphincterotomy 2008 for abnormal cholangiogram.  Multiple stones removed from [facility] 10/2006 colonoscopy and EGD for hematochezia, remote adenomatous polyps in setting of cardiac cath, plavix and integrillin: non-bleeding cecal AVMs, transverse , descending and sigmoid tics.    Takes Protonix daily, no NSAIDs except 81 ASA at home.   In declining mental status with loss of memory over one year.  Chronic issues with nausea and upset stomach but not much vomiting for years.  Chronic constipation.  2 months of anorexia and more recently increased nausea with non-bloody emesis.  Loose, non-bloody stools as opposed to the normal constipation. 7/3 visit to ED with weakness.  Labs with Alk phos 158, AST/ALT 91/184, normal t bili.  Ultrasound: s/p chole, heterogenous liver with no discrete mass, 5 mm CBD. Sent home on prn Zofran.  Later got Rx for promethazine which helped though anorexia and nausea persisted.  Follow up 7/22 with Dr Arelia Sneddon, told family to stop her Aricept and stick with Zofran.  Yesterday, was profoundly weak, babbling with AMS.  C/o SOB and chest pressure.   Family brought her to ED.    Labs with t bili 5, alk phos 301, AST/ALT 353/415.  troponins 0.04, 0.12. CT scan with mass at pancreatic head and uncinate, and D2.  Sigmoid tics, hazy lesion in right  inferior lobe liver.  MRCP planned today if she can tolerate.  ROS:  Chronic itching for years.  Chronic cough, worsening SOB.  Urine concentrated and dark.  No jaundice.  No bleeding PR or in emesis.  No chills, fever.  Right shoulder pain in the joint itself, unable to raise that arm.  No LE edema.      Past Medical History  Diagnosis Date  . CAD (coronary artery disease)     a. s/p CABG x5 (2005) b. s/p BMS-prox SVG-D2 (2014)  . Hypertension   . Hyperlipidemia   . GI bleed     AVMs  . Aneurysm, aortic     thoracic aorta, stable at 4.1 cm, chest CT, May, 2012  . Syncope     Nitroglycerin plus a diuretic, April, 2009  . Hyponatremia     Chronic. Felt secondary to SIADH   . COPD (chronic obstructive pulmonary disease)     on home oxygen  . Tobacco abuse   . Bradycardia   . Carotid artery disease     Hx of endarterectomy. Doppler October, 2011, stable, 3-54% RIC A., 65-68% LICA  . Tuberculosis     Exposures to tuberculosis 1970s, has tested negative by the health Department  . Atrial  fibrillation     Paroxysmal with RVR 07/2011, not felt to be a coumadin candidate secondary to hx of GIB/AVM  . Venous stasis of lower extremity     Chronic  . Ejection fraction     a. EF 55-60%, echo, April, 2013 b. EF 55-60%, mild biatrial enlargement and PASP 42 mmH  . CHF with left ventricular diastolic dysfunction, NYHA class 2   . AAA (abdominal aortic aneurysm)   . Complication of anesthesia   . Hx of CABG     CABG 2005  . Hematoma     Right groin hematoma, small, post cath, April, 2014  . Mitral regurgitation     Mild, hospital, March, 2014  . Leg ulcer     Ulcerated lesion on the anterior aspect of her left lower leg, June, 2014    Past Surgical History  Procedure Laterality Date  . Cholecystectomy    . Carotid endartercetomy    . Abdominal hysterectomy    . Coronary artery bypass graft  2005  . Coronary angioplasty with stent placement  07/18/12    severe native coronary  artery disease, 99% proximal stenosis of the SVG-D2 status post successful PTCA/PCI with a Veriflex bare-metal stent, widely patent SVGs to both the right PDA sequential OM1 to OM 2, EF 65-70% and 3+ mitral regurgitation  . Left heart catheterization with coronary angiogram N/A 07/18/2012    Procedure: LEFT HEART CATHETERIZATION WITH CORONARY ANGIOGRAM;  Surgeon: Leonie Man, MD;  Location: [facility] CATH LAB;  Service: Cardiovascular;  Laterality: N/A;  . Percutaneous coronary stent intervention (pci-s)  07/18/2012    Procedure: PERCUTANEOUS CORONARY STENT INTERVENTION (PCI-S);  Surgeon: Leonie Man, MD;  Location: [facility] C[facility] LAB;  Service: Cardiovascular;;    Prior to Admission medications   Medication Sig Start Date End Date Taking? Authorizing Provider  albuterol (PROVENTIL HFA;VENTOLIN HFA) 108 (90 BASE) MCG/ACT inhaler Inhale 2 puffs into the lungs every 4 (four) hours as needed for wheezing or shortness of breath. 07/16/14   Tammy S Parrett, NP  albuterol (PROVENTIL) (2.5 MG/3ML) 0.083% nebulizer solution Take 2.5 mg by nebulization every 6 (six) hours as needed for wheezing.    Historical Provider, MD  ALPRAZolam Duanne Moron) 0.25 MG tablet Take 0.25 mg by mouth at bedtime as needed for anxiety.    Historical Provider, MD  aspirin EC 81 MG tablet Take 81 mg by mouth daily.    Historical Provider, MD  atorvastatin (LIPITOR) 40 MG tablet Take 0.5 tablets (20 mg total) by mouth daily at 6 PM. 11/07/13   Burtis Junes, NP  budesonide-formoterol (SYMBICORT) 160-4.5 MCG/ACT inhaler Inhale 2 puffs into the lungs 2 (two) times daily. 06/14/14   Rigoberto Noel, MD  CARTIA XT 300 MG 24 hr capsule TAKE 1 CAPSULE DAILY 10/25/14   Carlena Bjornstad, MD  furosemide (LASIX) 40 MG tablet Take 1 tablet (40 mg total) by mouth 2 (two) times daily. 08/29/13   Carlena Bjornstad, MD  insulin aspart protamine- aspart (NOVOLOG MIX 70/30) (70-30) 100 UNIT/ML injection Inject into the skin.    Historical Provider, MD  magnesium oxide  (MAG-OX) 400 MG tablet Take 400 mg by mouth daily.    Historical Provider, MD  metFORMIN (GLUCOPHAGE) 500 MG tablet Take 500 mg by mouth 2 (two) times daily with a meal.    Historical Provider, MD  metoprolol succinate (TOPROL-XL) 25 MG 24 hr tablet Take 1 tablet (25 mg total) by mouth daily. 11/24/13   Carlena Bjornstad,  MD  Multiple Vitamins-Minerals (PRESERVISION AREDS 2 PO) Take 1 tablet by mouth 2 (two) times daily.    Historical Provider, MD  nitroGLYCERIN (NITROSTAT) 0.4 MG SL tablet Place 0.4 mg under the tongue every 5 (five) minutes as needed for chest pain.    Historical Provider, MD  ondansetron (ZOFRAN) 8 MG tablet Take 1 tablet (8 mg total) by mouth every 8 (eight) hours as needed for nausea or vomiting. 10/28/14   Daleen Bo, MD  OXYGEN Inhale into the lungs. 2L continously    Historical Provider, MD  pantoprazole (PROTONIX) 40 MG tablet Take 40 mg by mouth daily.    Historical Provider, MD  potassium chloride SA (K-DUR,KLOR-CON) 20 MEQ tablet Take 20 mEq by mouth 2 (two) times daily.    Historical Provider, MD  SPIRIVA HANDIHALER 18 MCG inhalation capsule INHALE THE CONTENTS OF 1 CAPSULE DAILY 09/10/14   Rigoberto Noel, MD  Tiotropium Bromide Monohydrate (SPIRIVA RESPIMAT) 2.5 MCG/ACT AERS Inhale 2 puffs into the lungs daily. 07/16/14   Tammy S Parrett, NP  Vitamin D, Ergocalciferol, (DRISDOL) 50000 UNITS CAPS Take 50,000 Units by mouth every 14 (fourteen) days. Every other Sunday    Historical Provider, MD    Scheduled Meds: . antiseptic oral rinse  7 mL Mouth Rinse BID  . aspirin EC  81 mg Oral Daily  . atorvastatin  20 mg Oral q1800  . budesonide-formoterol  2 puff Inhalation BID  . cefTAZidime (FORTAZ)  IV  1 g Intravenous Q12H  . diltiazem  300 mg Oral Daily  . heparin  5,000 Units Subcutaneous 3 times per day  . insulin aspart  0-15 Units Subcutaneous TID WC  . insulin aspart  0-5 Units Subcutaneous QHS  . LORazepam  1 mg Intravenous Once  . metoprolol succinate  25 mg Oral  Daily  . pantoprazole  40 mg Oral Daily  . sodium chloride  3 mL Intravenous Q12H  . tiotropium  1 capsule Inhalation Daily  . vancomycin  1,000 mg Intravenous Q24H   Infusions: . 0.9 % NaCl with KCl 40 mEq / L 75 mL/hr (11/18/14 0810)  . diltiazem (CARDIZEM) infusion 15 mg/hr (11/18/14 0810)   PRN Meds: acetaminophen **OR** acetaminophen, ALPRAZolam, ondansetron **OR** ondansetron (ZOFRAN) IV   Allergies as of 11/17/2014 - Review Complete 11/17/2014  Allergen Reaction Noted  . Other Other (See Comments) 04/28/2013  . Codeine Nausea And Vomiting   . Sulfonamide derivatives Nausea And Vomiting   . Warfarin  11/20/2013  . Penicillins Itching, Rash, and Other (See Comments)   . Ranitidine hcl Itching and Rash     Family History  Problem Relation Age of Onset  . Stroke Sister   . Heart failure Mother   . Cancer Sister     Breast and Lung   . Cancer Brother     breast cancer    History   Social History  . Marital Status: Widowed    Spouse Name: N/A  . Number of Children: 1  . Years of Education: N/A   Occupational History  . retired     [employer]   Social History Main Topics  . Smoking status: Former Smoker -- 0.50 packs/day for 59 years    Types: Cigarettes    Quit date: 02/01/2013  . Smokeless tobacco: Not on file  . Alcohol Use: No  . Drug Use: Not on file  . Sexual Activity: Not on file   Other Topics Concern  . Not on file   Social History  Narrative    REVIEW OF SYSTEMS: Constitutional:  Per HPI ENT:  No nose bleeds Pulm:  Per hpi.   CV:  No palpitations, no LE edema.  GU:  No hematuria, no frequency GI:  Per hpi Heme:  No unusual bleeding or bruising   Transfusions:  None per her recall Neuro:  No headaches, no peripheral tingling or numbness Derm:  No itching, no rash or sores.  Endocrine:  No sweats or chills.  No polyuria or dysuria Immunization:  Not queried Travel:  None beyond local counties in last few months.    PHYSICAL EXAM: Vital  signs in last 24 hours: Filed Vitals:   11/18/14 0400  BP: 106/61  Pulse: 112  Temp: 98.5 F (36.9 C)  Resp: 22   Wt Readings from Last 3 Encounters:  11/18/14 185 lb 6.5 oz (84.1 kg)  07/16/14 185 lb (83.915 kg)  05/08/14 191 lb 5.8 oz (86.8 kg)    General: frail, unwell looking WF.   Head:  No swelling or assymetry  Eyes:  Clear, no icterus, no pallor Ears:  HOH  Nose:  No congestion or discharge Mouth:  Dry mm but clear,  Teeth missing Neck:  No jvd or bruit.  No mass, no TMG Lungs:  Clear bil but very diminished sounds.  Cough and moderately labored breathing Heart: irreg, irreg: afib on tele.  No mrg.  S1/s2 audible Abdomen:  Soft, protuberant, distended, fine incision scar at umbilicus.   Rectal: deferred   Musc/Skeltl: no joint redness or swelling Extremities:  No CCE  Neurologic:  Oriented to year,not month.  To self and to [facility].  Alert.  Moves all 4s, strength not tested.  Skin:  Silvery plaques with excoriation/scabs on extensor surfaces of both arms and left leg Tattoos:  none Nodes:  No cervical adenopathy   Psych:  Pleasant, cooperative, not agitated.   Intake/Output from previous day: 07/23 0701 - 07/24 0700 In: 50 [IV Piggyback:50] Out: 413 [Urine:413] Intake/Output this shift:    LAB RESULTS:  Recent Labs  11/17/14 2222 11/18/14 0321  WBC 17.9* 18.2*  HGB 11.9* 9.9*  HCT 36.2 30.3*  PLT 192 155  MCV     87  BMET Lab Results  Component Value Date   NA 131* 11/18/2014   NA 133* 11/17/2014   NA 135 10/28/2014   K 3.4* 11/18/2014   K 3.4* 11/17/2014   K 3.5 10/28/2014   CL 89* 11/18/2014   CL 87* 11/17/2014   CL 89* 10/28/2014   CO2 32 11/18/2014   CO2 33* 11/17/2014   CO2 33* 10/28/2014   GLUCOSE 177* 11/18/2014   GLUCOSE 189* 11/17/2014   GLUCOSE 166* 10/28/2014   BUN 13 11/18/2014   BUN 12 11/17/2014   BUN 12 10/28/2014   CREATININE 1.26* 11/18/2014   CREATININE 1.22* 11/17/2014   CREATININE 1.19* 10/28/2014   CALCIUM 9.2  11/18/2014   CALCIUM 10.3 11/17/2014   CALCIUM 10.0 10/28/2014   LFT  Recent Labs  11/17/14 2222 11/18/14 0321  PROT 7.0 5.7*  ALBUMIN 3.6 2.9*  AST 353* 223*  ALT 415* 300*  ALKPHOS 301* 230*  BILITOT 5.0* 4.1*   PT/INR Lab Results  Component Value Date   INR 1.32 11/18/2014   INR 1.13 11/17/2014   INR 1.04 07/18/2012   Hepatitis Panel No results for input(s): HEPBSAG, HCVAB, HEPAIGM, HEPBIGM in the last 72 hours. C-Diff No components found for: CDIFF Lipase     Component Value Date/Time   LIPASE  28 11/17/2014 2222    Drugs of Abuse  No results found for: LABOPIA, COCAINSCRNUR, LABBENZ, AMPHETMU, THCU, LABBARB   RADIOLOGY STUDIES: Ct Abdomen Pelvis W Contrast  11/18/2014   CLINICAL DATA:  81 year old female with fever and abdominal pain.  EXAM: CT ABDOMEN AND PELVIS WITH CONTRAST  TECHNIQUE: Multidetector CT imaging of the abdomen and pelvis was performed using the standard protocol following bolus administration of intravenous contrast.  CONTRAST:  38m OMNIPAQUE IOHEXOL 300 MG/ML  SOLN  COMPARISON:  CT dated 05/04/2014 and ultrasound dated 10/28/2014  FINDINGS: Evaluation is limited due to streak artifact caused by patient's arms and metallic left hip arthroplasty.  Emphysematous changes of the lung bases. There is coronary vascular calcification. No intra-abdominal free air or free fluid.  There is mild hepatic surface irregularity. A 1.6 x 1.0 cm hypodense lesion in the inferior right lobe of the liver (series 201 image 37) is not well characterized. MRI may provide better evaluation. Cholecystectomy. The pancreas is atrophic. There is an ill-defined soft tissue prominence involving the head of the pancreas at the ampulla of Vater. This area is not well evaluated on the CT. The the capillary mass or pancreatic lesion is not excluded. Endoscopic ultrasound is recommended for better evaluation. The spleen, adrenal glands appear unremarkable. There is moderate bilateral renal  atrophy. There is no hydronephrosis. The visualized ureters and urinary bladder appear unremarkable. Hysterectomy.  Extensive sigmoid diverticulosis with muscular hypertrophy. No definite active inflammatory changes. Moderate stool in the distal colon. No evidence of bowel obstruction. There is a 1.6 cm duodenal diverticulum. There is soft tissue density along the medial wall of the second portion of the duodenum (series 201, image 35). Normal appendix.  There is aortoiliac atherosclerotic disease. A 3.2 cm infrarenal abdominal aortic aneurysm. The origins of the celiac axis, SMA, IMA as well as the origins of the renal arteries appear patent. No portal venous gas identified. Ill-defined nodular soft tissue density in the region of the porta hepatis likely represent lymph nodes. There is diastases of anterior abdominal wall musculature in the midline with a small fat containing umbilical hernia. Degenerative changes of the spine. Left hip arthroplasty. No acute fracture.  IMPRESSION: Ill-defined soft tissue thickness involving the head and uncinate process of the pancreas as well as the medial wall of the second portion of the duodenum. MRI or endoscopic ultrasound is recommended for further evaluation.  Sigmoid diverticulosis. No evidence of bowel obstruction or inflammation. Normal appendix.  Incompletely characterized hypodense lesion along the inferior surface of the right lobe of the liver. MRI may provide better characterization.   Electronically Signed   By: AAnner CreteM.D.   On: 11/18/2014 01:03   Dg Chest Portable 1 View  11/17/2014   CLINICAL DATA:  81year old female with shortness of breath and tachycardia.  EXAM: PORTABLE CHEST - 1 VIEW  COMPARISON:  Radiograph dated 10/28/2014  FINDINGS: Single-view of the chest demonstrate emphysematous changes of the lungs. There is no focal consolidation, pleural effusion, or pneumothorax. The cardiomediastinal silhouette is within normal limits.  Degenerative changes of the osseous structures.  IMPRESSION: Emphysema.  No focal consolidation or pneumothorax.   Electronically Signed   By: AAnner CreteM.D.   On: 11/17/2014 22:50    ENDOSCOPIC STUDIES: Per HPI  IMPRESSION:   *  Elevated LFTs with pancreatic vs duodenal mass on CT.  MRCP ordered 2 months of anorexia, n/v.   *  Oxygen dependent COPD  *  A fib, rate in lower  100s.  Minor elevation of 2 Troponins. Hx CABG, stents, CAD.    *  AMS.  Background of dementia with 1 years decline.  Acute altered MS has improved overnight.  *  Normocytic anemia.  *  Hx GI bleed in setting of antiplatlet therapy in 2008: ? Diverticular vs secondary to AVMs.     PLAN:     *  Await MRCP.  Will likely need ERCP.     Azucena Freed  11/18/2014, 9:18 AM Pager: [phone] Attending MD note:   I have taken a history, examined the patient, and reviewed the chart. Just returned from MRCP- poor quality due to motion effect. No dilation of bile ducts ( extra or intrahepatic), mild focal dilation at the porta hepatis, with questionable periportal lymph node .No  evidence of pancreatic mass .Question intrahepatic cholestasis. Would check hepatitis serologies, ANA, AntiSM antib, ,Cerruloplasmin,ammonia level. Coags are normal as is splenic size.Would wait with ERCP ,need more information. DC LIpitor,    [facility] Pager # [phone]

## 2014-11-18 NOTE — H&P (Signed)
[facility] History and Physical  Patient: [name]  MRN: [id number]  DOB: 10-26-1929  DOS: the patient was seen and examined on 11/18/2014 PCP: Kaleen Mask, MD  Referring physician: Dr. Shan Levans Chief Complaint: Shortness of breath  HPI: [name] is a 81 y.o. female with Past medical history of coronary artery disease status post CABG, GI bleeding, atrial fibrillation not on any anticoagulation, hypertension, peripheral vascular disease, COPD, chronic respiratory failure on oxygen, chronic diastolic dysfunction. The patient is presenting with complaint of shortness of breath. Next and the patient is a poor historian as she is unable to provide any significant history. There was no family member available at the time of my evaluation therefore the history has been obtained from ED documentation. The patient was initially brought to ER for acute encephalopathy as well as shortness of breath. Secondary to her general condition critical care was consulted and later on since critical care for the patient to be appropriately treated in stepdown unit hospitalist was consulted for admission. The patient at the time of my evaluation denies any fall, passing out episodes, headache, focal deficit, chest pain, abdominal pain, cough, nausea, vomiting, diarrhea, constipation, burning urination. Patient mentions her daughter-in-law told that she was more short of breath. Reportedly she was significantly weak and was not eating enough as well as was confused and was not acting herself.  The patient is coming from home  At her baseline ambulates with walker And is independent for most of her ADL does not manages her medication on her own.  Review of Systems: as mentioned in the history of present illness.  A comprehensive review of the other systems is negative.  Past Medical History  Diagnosis Date  . CAD (coronary artery disease)     a. s/p CABG x5 (2005) b. s/p  BMS-prox SVG-D2 (2014)  . Hypertension   . Hyperlipidemia   . GI bleed     AVMs  . Aneurysm, aortic     thoracic aorta, stable at 4.1 cm, chest CT, May, 2012  . Syncope     Nitroglycerin plus a diuretic, April, 2009  . Hyponatremia     Chronic. Felt secondary to SIADH   . COPD (chronic obstructive pulmonary disease)   . Tobacco abuse   . Bradycardia   . Carotid artery disease     Hx of endarterectomy. Doppler October, 2011, stable, 0-39% RIC A., 40-59% LICA  . Tuberculosis     Exposures to tuberculosis 1970s, has tested negative by the health Department  . Atrial fibrillation     Paroxysmal with RVR 07/2011, not felt to be a coumadin candidate secondary to hx of GIB/AVM  . Venous stasis of lower extremity     Chronic  . Ejection fraction     a. EF 55-60%, echo, April, 2013 b. EF 55-60%, mild biatrial enlargement and PASP 42 mmH  . CHF with left ventricular diastolic dysfunction, NYHA class 2   . AAA (abdominal aortic aneurysm)   . On home O2     2L N/C  . Complication of anesthesia   . Shortness of breath   . Hx of CABG     CABG 2005  . Hematoma     Right groin hematoma, small, post cath, April, 2014  . Mitral regurgitation     Mild, hospital, March, 2014  . Leg ulcer     Ulcerated lesion on the anterior aspect of her left lower leg, June, 2014   Past Surgical History  Procedure Laterality Date  . Cholecystectomy    . Carotid endartercetomy    . Abdominal hysterectomy    . Coronary artery bypass graft  2005  . Coronary angioplasty with stent placement  07/18/12    severe native coronary artery disease, 99% proximal stenosis of the SVG-D2 status post successful PTCA/PCI with a Veriflex bare-metal stent, widely patent SVGs to both the right PDA sequential OM1 to OM 2, EF 65-70% and 3+ mitral regurgitation  . Left heart catheterization with coronary angiogram N/A 07/18/2012    Procedure: LEFT HEART CATHETERIZATION WITH CORONARY ANGIOGRAM;  Surgeon: Marykay Lex, MD;   Location: [facility] CATH LAB;  Service: Cardiovascular;  Laterality: N/A;  . Percutaneous coronary stent intervention (pci-s)  07/18/2012    Procedure: PERCUTANEOUS CORONARY STENT INTERVENTION (PCI-S);  Surgeon: Marykay Lex, MD;  Location: [facility] CATH LAB;  Service: Cardiovascular;;   Social History:  reports that she quit smoking about 21 months ago. Her smoking use included Cigarettes. She has a 29.5 pack-year smoking history. She does not have any smokeless tobacco history on file. She reports that she does not drink alcohol. Her drug history is not on file.  Allergies  Allergen Reactions  . Other Other (See Comments)    TETANUS-can only take 1/2 dose at one time, can take the other 1/2 dose about 3 days later  . Codeine Nausea And Vomiting  . Sulfonamide Derivatives Nausea And Vomiting  . Warfarin     H/o AVM's prechief complaintcluding anticoagulation  . Penicillins Itching, Rash and Other (See Comments)    Nasal itching   . Ranitidine Hcl Itching and Rash    Family History  Problem Relation Age of Onset  . Stroke Sister   . Heart failure Mother   . Cancer Sister     Breast and Lung   . Cancer Brother     breast cancer    Prior to Admission medications   Medication Sig Start Date End Date Taking? Authorizing Provider  albuterol (PROVENTIL HFA;VENTOLIN HFA) 108 (90 BASE) MCG/ACT inhaler Inhale 2 puffs into the lungs every 4 (four) hours as needed for wheezing or shortness of breath. 07/16/14   Tammy S Parrett, NP  albuterol (PROVENTIL) (2.5 MG/3ML) 0.083% nebulizer solution Take 2.5 mg by nebulization every 6 (six) hours as needed for wheezing.    Historical Provider, MD  ALPRAZolam Prudy Feeler) 0.25 MG tablet Take 0.25 mg by mouth at bedtime as needed for anxiety.    Historical Provider, MD  aspirin EC 81 MG tablet Take 81 mg by mouth daily.    Historical Provider, MD  atorvastatin (LIPITOR) 40 MG tablet Take 0.5 tablets (20 mg total) by mouth daily at 6 PM. 11/07/13   Rosalio Macadamia, NP    budesonide-formoterol (SYMBICORT) 160-4.5 MCG/ACT inhaler Inhale 2 puffs into the lungs 2 (two) times daily. 06/14/14   Oretha Milch, MD  CARTIA XT 300 MG 24 hr capsule TAKE 1 CAPSULE DAILY 10/25/14   Luis Abed, MD  furosemide (LASIX) 40 MG tablet Take 1 tablet (40 mg total) by mouth 2 (two) times daily. 08/29/13   Luis Abed, MD  insulin aspart protamine- aspart (NOVOLOG MIX 70/30) (70-30) 100 UNIT/ML injection Inject into the skin.    Historical Provider, MD  magnesium oxide (MAG-OX) 400 MG tablet Take 400 mg by mouth daily.    Historical Provider, MD  metFORMIN (GLUCOPHAGE) 500 MG tablet Take 500 mg by mouth 2 (two) times daily with a meal.  Historical Provider, MD  metoprolol succinate (TOPROL-XL) 25 MG 24 hr tablet Take 1 tablet (25 mg total) by mouth daily. 11/24/13   Luis Abed, MD  Multiple Vitamins-Minerals (PRESERVISION AREDS 2 PO) Take 1 tablet by mouth 2 (two) times daily.    Historical Provider, MD  nitroGLYCERIN (NITROSTAT) 0.4 MG SL tablet Place 0.4 mg under the tongue every 5 (five) minutes as needed for chest pain.    Historical Provider, MD  ondansetron (ZOFRAN) 8 MG tablet Take 1 tablet (8 mg total) by mouth every 8 (eight) hours as needed for nausea or vomiting. 10/28/14   Mancel Bale, MD  OXYGEN Inhale into the lungs. 2L continously    Historical Provider, MD  pantoprazole (PROTONIX) 40 MG tablet Take 40 mg by mouth daily.    Historical Provider, MD  potassium chloride SA (K-DUR,KLOR-CON) 20 MEQ tablet Take 20 mEq by mouth 2 (two) times daily.    Historical Provider, MD  SPIRIVA HANDIHALER 18 MCG inhalation capsule INHALE THE CONTENTS OF 1 CAPSULE DAILY 09/10/14   Oretha Milch, MD  Tiotropium Bromide Monohydrate (SPIRIVA RESPIMAT) 2.5 MCG/ACT AERS Inhale 2 puffs into the lungs daily. 07/16/14   Tammy S Parrett, NP  Vitamin D, Ergocalciferol, (DRISDOL) 50000 UNITS CAPS Take 50,000 Units by mouth every 14 (fourteen) days. Every other Sunday    Historical Provider, MD     Physical Exam: Filed Vitals:   11/18/14 0315 11/18/14 0330 11/18/14 0335 11/18/14 0400  BP: 114/51 121/61  106/61  Pulse: 118 114 112   Temp:    98.5 F (36.9 C)  TempSrc:    Oral  Resp: 23 19 22    Height:   6' (1.829 m)   Weight:   84.1 kg (185 lb 6.5 oz)   SpO2: 97% 94% 98%     General: Alert, Awake and Oriented to Time, Place and Person. Appear in mild distress Eyes: PERRL ENT: Oral Mucosa clear dry. Neck: Difficult to assess JVD Cardiovascular: S1 and S2 Present, aortic systolic Murmur, Peripheral Pulses Present Respiratory: Bilateral Air entry equal and Decreased,  Clear to Auscultation, no Crackles, no wheezes Abdomen: Bowel Sound present, Soft and non- tender Skin: Multiple scratch marks on both upper extremity as well as left leg Multiple macular rash on the same medication. There is some white discoloration of his extremities which she mentions is due to some ointment that she applies.  Extremities: No Pedal edema, no calf tenderness Neurologic: Grossly no focal neuro deficit.  Labs on Admission:  CBC:  Recent Labs Lab 11/17/14 2222 11/18/14 0321  WBC 17.9* 18.2*  NEUTROABS 16.9* 16.9*  HGB 11.9* 9.9*  HCT 36.2 30.3*  MCV 87.7 87.8  PLT 192 155    CMP     Component Value Date/Time   NA 131* 11/18/2014 0321   K 3.4* 11/18/2014 0321   CL 89* 11/18/2014 0321   CO2 32 11/18/2014 0321   GLUCOSE 177* 11/18/2014 0321   BUN 13 11/18/2014 0321   CREATININE 1.26* 11/18/2014 0321   CALCIUM 9.2 11/18/2014 0321   PROT 5.7* 11/18/2014 0321   ALBUMIN 2.9* 11/18/2014 0321   AST 223* 11/18/2014 0321   ALT 300* 11/18/2014 0321   ALKPHOS 230* 11/18/2014 0321   BILITOT 4.1* 11/18/2014 0321   GFRNONAA 38* 11/18/2014 0321   GFRAA 44* 11/18/2014 0321     Recent Labs Lab 11/17/14 2222  LIPASE 28     Recent Labs Lab 11/17/14 2222 11/18/14 0321  TROPONINI 0.04* 0.12*   BNP (  last 3 results)  Recent Labs  11/17/14 2223  BNP 179.0*    ProBNP  (last 3 results)  Recent Labs  02/01/14 1000  PROBNP 490.9*     Radiological Exams on Admission: Ct Abdomen Pelvis W Contrast  11/18/2014   CLINICAL DATA:  81 year old female with fever and abdominal pain.  EXAM: CT ABDOMEN AND PELVIS WITH CONTRAST  TECHNIQUE: Multidetector CT imaging of the abdomen and pelvis was performed using the standard protocol following bolus administration of intravenous contrast.  CONTRAST:  80mL OMNIPAQUE IOHEXOL 300 MG/ML  SOLN  COMPARISON:  CT dated 05/04/2014 and ultrasound dated 10/28/2014  FINDINGS: Evaluation is limited due to streak artifact caused by patient's arms and metallic left hip arthroplasty.  Emphysematous changes of the lung bases. There is coronary vascular calcification. No intra-abdominal free air or free fluid.  There is mild hepatic surface irregularity. A 1.6 x 1.0 cm hypodense lesion in the inferior right lobe of the liver (series 201 image 37) is not well characterized. MRI may provide better evaluation. Cholecystectomy. The pancreas is atrophic. There is an ill-defined soft tissue prominence involving the head of the pancreas at the ampulla of Vater. This area is not well evaluated on the CT. The the capillary mass or pancreatic lesion is not excluded. Endoscopic ultrasound is recommended for better evaluation. The spleen, adrenal glands appear unremarkable. There is moderate bilateral renal atrophy. There is no hydronephrosis. The visualized ureters and urinary bladder appear unremarkable. Hysterectomy.  Extensive sigmoid diverticulosis with muscular hypertrophy. No definite active inflammatory changes. Moderate stool in the distal colon. No evidence of bowel obstruction. There is a 1.6 cm duodenal diverticulum. There is soft tissue density along the medial wall of the second portion of the duodenum (series 201, image 35). Normal appendix.  There is aortoiliac atherosclerotic disease. A 3.2 cm infrarenal abdominal aortic aneurysm. The origins of the  celiac axis, SMA, IMA as well as the origins of the renal arteries appear patent. No portal venous gas identified. Ill-defined nodular soft tissue density in the region of the porta hepatis likely represent lymph nodes. There is diastases of anterior abdominal wall musculature in the midline with a small fat containing umbilical hernia. Degenerative changes of the spine. Left hip arthroplasty. No acute fracture.  IMPRESSION: Ill-defined soft tissue thickness involving the head and uncinate process of the pancreas as well as the medial wall of the second portion of the duodenum. MRI or endoscopic ultrasound is recommended for further evaluation.  Sigmoid diverticulosis. No evidence of bowel obstruction or inflammation. Normal appendix.  Incompletely characterized hypodense lesion along the inferior surface of the right lobe of the liver. MRI may provide better characterization.   Electronically Signed   By: Elgie Collard M.D.   On: 11/18/2014 01:03   Dg Chest Portable 1 View  11/17/2014   CLINICAL DATA:  81 year old female with shortness of breath and tachycardia.  EXAM: PORTABLE CHEST - 1 VIEW  COMPARISON:  Radiograph dated 10/28/2014  FINDINGS: Single-view of the chest demonstrate emphysematous changes of the lungs. There is no focal consolidation, pleural effusion, or pneumothorax. The cardiomediastinal silhouette is within normal limits. Degenerative changes of the osseous structures.  IMPRESSION: Emphysema.  No focal consolidation or pneumothorax.   Electronically Signed   By: Elgie Collard M.D.   On: 11/17/2014 22:50   Assessment/Plan Principal Problem:   Sepsis Active Problems:   OXYGEN-USE OF SUPPLEMENTAL   Hypertension   COPD (chronic obstructive pulmonary disease)   Hx of CABG   Chronic  diastolic CHF (congestive heart failure)   Atrial fibrillation with rapid ventricular response   Transaminitis   Pancreatic lesion   1. Sepsis  Transaminitis. Pancreatic lesion.  The patient was  brought in by family due to his generalized weakness and fatigue. Patient was found to be having A. fib with RVR. Further workup shows that she also has fever, elevated bilirubin, transaminitis, lactic acidosis, leukocytosis concerning for possible sepsis. Possible source could be urine as well as intra-abdominal. CT scan of the abdomen although does not show any acute abnormality and shows possible pancreatic lesion. Recommendation is for an MRI. Chest x-ray is clear. Patient was initially discussed with critical care but later on felt appropriate for stepdown unit. Would continue with vancomycin and I'll change her antibiotics to [place]. Follow cultures. We will recheck LFT and if remain elevated we will get an MRI of abdomen.  2. A. fib with RVR. The patient is on diltiazem as well as metoprolol at home. Currently placed on diltiazem drip. We'll continue the drip as it will allow [place] to titrate according to her blood pressure. We will hold off on the oral Cardizem therefore. Also holding off on Lopressor at present. Can resume medication once her rate is subsequently controlled.  3. Coronary artery disease, history of chronic diastolic heart failure. In view of ongoing sepsis holding Lasix. Continue with aspirin.  4. Diabetes mellitus. Holding oral hypoglycemic agent placing her on sliding scale.  5. GERD. Continuing PPI.  6. COPD. Does not appear to be having an acute flareup. Continuing inhalers.  Advance goals of care discussion: Full code as per my discussion with patient and patient's son will be her power of attorney.   Consults: ED discussed with critical care  DVT Prophylaxis: subcutaneous Heparin Nutrition: Cardiac and diabetic diet  Disposition: Admitted as inpatient, step-down unit.  Author: Lynden Oxford, MD [facility] Pager: [phone] 11/18/2014  If 7PM-7AM, please contact night-coverage [email or web address] Password [email or web address]

## 2014-11-18 NOTE — Progress Notes (Signed)
Patient Demographics:    [name], is a 81 y.o. female, DOB - Oct 30, 1929, [id number]:[id number]  Admit date - 11/17/2014   Admitting Physician Rolly Salter, MD  Outpatient Primary MD for the patient is Kaleen Mask, MD  LOS - 0   Chief Complaint  Patient presents with  . Altered Mental Status  . Tachycardia        Subjective:    [name] today has, No headache, No chest pain, No abdominal pain - No Nausea, No new weakness tingling or numbness, mild dry cough with shortness of breath   Assessment  & Plan :    1.? Sepsis - unclear presentation with generalized weakness, leukocytosis, encephalopathy. Does have elevated liver enzymes, does have question of a pancreatic mass - question if she has mild cholangitis, no abdominal pain, for now she has been started on Vanco along with ceftaz edema upon admission, we will add IV Flagyl. CT scan abdomen and pelvis and MRI abdomen both inconclusive. GI has been consulted. Will follow cultures, chest x-ray negative, UA with possible mild UTI. We will monitor.   2. Elevated liver enzymes with questionable pancreatic mass on CT and MRI abdomen. Could have mild cholangitis as she had leukocytosis and encephalopathy on admission, anti-biotics as above, may need ERCP. GI consulted.   3. COPD. On home oxygen, currently at baseline, no wheezing on exam, supportive care with oxygen nebulizer treatments as needed.   4. Atrial fibrillation chronic in RVR upon admission. [place] Vasc of greater than 3. Now in rate control on IV Cardizem drip, titrate off Cardizem drip, placed on home dose oral Cardizem and Toprol-XL. Not on anticoagulation due to previous GI bleed.   5. CAD with history of chronic diastolic CHF last EF of 55% in 2014. Clinically compensated  from CHF standpoint, chest pain-free, mild elevation of troponin not in ACS pattern. Continue aspirin, beta blocker and statin for secondary prevention. Repeat echogram to evaluate wall motion.   6. Hyponatremia. Could be dehydrated as her oral intake prior to admission was poor, urine electrolytes, serum and urine osmolality. Gentle hydration and repeat BMP in the morning.   7. Hypokalemia. Replaced.   8. GERD. On PPI continue.   9.  Dyslipidemia. On home dose statin continue.    10. Encephalopathy due to #1 above. Much improved, continue supportive care, at risk for delirium. Minimize narcotics and benzodiazepine's.    11.DM type II. Hold Glucophage, on sliding scale monitor CBGs.  CBG (last 3)   Recent Labs  11/17/14 2237 11/18/14 1227  GLUCAP 171* 107*      Code Status : Full  Family Communication  : None present  Disposition Plan  : To be decided  Consults  : GI  Procedures  :   CT Abd - ? Pancreatic mass/edema  MRI ABD - ? Pancreatic mass  DVT Prophylaxis  :   Heparin   Lab Results  Component Value Date   PLT 155 11/18/2014    Inpatient Medications  Scheduled Meds: . antiseptic oral rinse  7 mL Mouth Rinse BID  . aspirin EC  81 mg Oral Daily  . atorvastatin  20 mg Oral q1800  . budesonide-formoterol  2 puff Inhalation BID  . cefTAZidime (FORTAZ)  IV  1 g Intravenous Q12H  . diltiazem  300 mg Oral Daily  . heparin  5,000 Units Subcutaneous 3 times per day  . insulin aspart  0-15 Units Subcutaneous TID WC  . insulin aspart  0-5 Units Subcutaneous QHS  . metoprolol succinate  25 mg Oral Daily  . pantoprazole  40 mg Oral Daily  . sodium chloride  3 mL Intravenous Q12H  . tiotropium  1 capsule Inhalation Daily  . vancomycin  1,000 mg Intravenous Q24H   Continuous Infusions: . 0.9 % NaCl with KCl 40 mEq / L 75 mL/hr (11/18/14 0810)  . diltiazem (CARDIZEM) infusion 5 mg/hr (11/18/14 1142)   PRN Meds:.acetaminophen **OR** acetaminophen,  ALPRAZolam, ondansetron **OR** ondansetron (ZOFRAN) IV  Antibiotics  :    Anti-infectives    Start     Dose/Rate Route Frequency Ordered Stop   11/18/14 1800  vancomycin (VANCOCIN) IVPB 1000 mg/200 mL premix     1,000 mg 200 mL/hr over 60 Minutes Intravenous Every 24 hours 11/18/14 0313     11/18/14 0330  cefTAZidime (FORTAZ) 1 g in dextrose 5 % 50 mL IVPB     1 g 100 mL/hr over 30 Minutes Intravenous Every 12 hours 11/18/14 0313     11/18/14 0300  levofloxacin (LEVAQUIN) IVPB 750 mg     750 mg 100 mL/hr over 90 Minutes Intravenous  Once 11/18/14 0246 11/18/14 0452   11/18/14 0300  vancomycin (VANCOCIN) IVPB 1000 mg/200 mL premix  Status:  Discontinued     1,000 mg 200 mL/hr over 60 Minutes Intravenous  Once 11/18/14 0246 11/18/14 0247   11/17/14 2345  imipenem-cilastatin (PRIMAXIN) 500 mg in sodium chloride 0.9 % 100 mL IVPB  Status:  Discontinued     500 mg 200 mL/hr over 30 Minutes Intravenous  Once 11/17/14 2338 11/18/14 0246   11/17/14 2315  piperacillin-tazobactam (ZOSYN) IVPB 3.375 g  Status:  Discontinued     3.375 g 100 mL/hr over 30 Minutes Intravenous  Once 11/17/14 2301 11/17/14 2302   11/17/14 2315  piperacillin-tazobactam (ZOSYN) IVPB 3.375 g  Status:  Discontinued     3.375 g 100 mL/hr over 30 Minutes Intravenous  Once 11/17/14 2302 11/17/14 2308   11/17/14 2315  vancomycin (VANCOCIN) 1,250 mg in sodium chloride 0.9 % 250 mL IVPB     1,250 mg 166.7 mL/hr over 90 Minutes Intravenous  Once 11/17/14 2305 11/18/14 0321        Objective:   Filed Vitals:   11/18/14 0900 11/18/14 1000 11/18/14 1135 11/18/14 1227  BP:   107/55   Pulse:  75 70 93  Temp:    98.6 F (37 C)  TempSrc:    Oral  Resp:      Height:      Weight:      SpO2: 99% 84% 96% 99%    Wt Readings from Last 3 Encounters:  11/18/14 84.1 kg (185 lb 6.5 oz)  07/16/14 83.915 kg (185 lb)  05/08/14 86.8 kg (191 lb 5.8 oz)     Intake/Output Summary (Last 24 hours) at 11/18/14 1307 Last data filed  at 11/18/14 0900  Gross per 24 hour  Intake    200 ml  Output    413 ml  Net   -213 ml     Physical Exam  Awake Alert, Oriented X 3, No new F.N deficits, Normal affect [place].AT,PERRAL Supple Neck,No JVD, No cervical lymphadenopathy appriciated.  Symmetrical Chest wall movement, moderate air movement bilaterally, CTAB RRR,No Gallops,Rubs or  new Murmurs, No Parasternal Heave +ve B.Sounds, Abd Soft, No tenderness, No organomegaly appriciated, No rebound - guarding or rigidity. No Cyanosis, Clubbing or edema, No new Rash or bruise       Data Review:   Micro Results Recent Results (from the past 240 hour(s))  MRSA PCR Screening     Status: None   Collection Time: 11/18/14  4:29 AM  Result Value Ref Range Status   MRSA by PCR NEGATIVE NEGATIVE Final    Comment:        The GeneXpert MRSA Assay (FDA approved for NASAL specimens only), is one component of a comprehensive MRSA colonization surveillance program. It is not intended to diagnose MRSA infection nor to guide or monitor treatment for MRSA infections.     Radiology Reports Dg Chest 2 View  10/28/2014   CLINICAL DATA:  Shortness of breath  EXAM: CHEST  2 VIEW  COMPARISON:  07/16/2014  FINDINGS: There is hyperinflation of the lungs compatible with COPD. Prior CABG. Cardiomegaly. Lungs are clear. No effusions. No acute bony abnormality.  IMPRESSION: COPD, cardiomegaly.  No active disease.   Electronically Signed   By: Charlett Nose M.D.   On: 10/28/2014 10:23   Mr Abdomen Wo Contrast  11/18/2014   CLINICAL DATA:  Elevated liver function tests. Remote history of breast cancer. Question of duodenal or pancreatic mass on prior exam.  EXAM: MRI ABDOMEN WITHOUT CONTRAST  TECHNIQUE: Multiplanar multisequence MR imaging was performed without the administration of intravenous contrast.  COMPARISON:  CT abdomen/pelvis 11/18/2014 and 05/04/2014, chest CT with incomplete upper abdominal imaging 09/25/2010  FINDINGS: Contrast was not  administered because the patient was unable to remain still and requested that the examination be terminated. There is significant motion artifact on multiple imaging sequences.  There is mild central intrahepatic ductal dilatation and mild periportal edema. Several too small to characterize hepatic T2 hyperintense lesions are most likely cysts. There is trace porta hepatis stranding/fluid but no definable collection is identified. Again noted is suggestion of irregular soft tissue at the porta hepatis just inferior to the portal vein but this cannot be measured with accuracy due to motion artifact on the current exam.  The common duct is normal in caliber and no filling defect is identified.  The pancreas is unremarkable without pancreatic ductal dilatation.  Adrenal glands are normal. Probable subcentimeter left renal cortical cyst. No hydronephrosis.  No measurable lymphadenopathy or aortic aneurysm. Artifact from sternal wires partly visualized.  IMPRESSION: Minimal periportal edema and central mild intrahepatic ductal dilatation. No hepatic mass is identified. Evaluation is limited by patient motion and lack of ability to administer contrast. There is a persisting question of soft tissue mass within the porta hepatis inferior to the portal vein which could be a lymph node or mass producing minimal central obstruction. Other etiologies could include stricture, occult stone, or bile duct mass not visualized. Groove pancreatitis is felt less likely because the patient reportedly has normal pancreatic enzymes at recent lab values.  No gross evidence for pancreatic mass.  Findings discussed in exam reviewed with Dr. Juanda Chance by Dr. Chilton Si on 11/18/2014 at 12:40 p.m.   Electronically Signed   By: Christiana Pellant M.D.   On: 11/18/2014 12:46   US Abdomen Complete  10/28/2014   CLINICAL DATA:  Nausea, hypertension, hyperlipidemia, COPD, smoking, coronary artery disease post CABG, atrial fibrillation, CHF  EXAM:  ULTRASOUND ABDOMEN COMPLETE  COMPARISON:  CT abdomen and pelvis 05/04/2014  FINDINGS: Gallbladder: Surgically absent  Common bile  duct: Diameter: 5 mm diameter common normal  Liver: Mildly heterogeneous echogenicity without focal mass. Hepatopetal portal venous flow.  IVC: Normal appearance  Pancreas: Normal appearance  Spleen: Normal appearance, 11.4 cm length  Right Kidney: Length: 11.9 cm. Cortical thinning. Normal cortical echogenicity. No mass hydronephrosis.  Left Kidney: Length: 9.9 cm. Normal cortical echogenicity. Cortical thinning. No mass or hydronephrosis.  Abdominal aorta: Atherosclerotic changes without aneurysm.  Other findings: No free fluid  IMPRESSION: Post cholecystectomy.  Heterogeneous hepatic echogenicity without discrete mass.  BILATERAL renal cortical atrophy.  Otherwise negative exam.   Electronically Signed   By: Ulyses Southward M.D.   On: 10/28/2014 15:11   Ct Abdomen Pelvis W Contrast  11/18/2014   CLINICAL DATA:  81 year old female with fever and abdominal pain.  EXAM: CT ABDOMEN AND PELVIS WITH CONTRAST  TECHNIQUE: Multidetector CT imaging of the abdomen and pelvis was performed using the standard protocol following bolus administration of intravenous contrast.  CONTRAST:  80mL OMNIPAQUE IOHEXOL 300 MG/ML  SOLN  COMPARISON:  CT dated 05/04/2014 and ultrasound dated 10/28/2014  FINDINGS: Evaluation is limited due to streak artifact caused by patient's arms and metallic left hip arthroplasty.  Emphysematous changes of the lung bases. There is coronary vascular calcification. No intra-abdominal free air or free fluid.  There is mild hepatic surface irregularity. A 1.6 x 1.0 cm hypodense lesion in the inferior right lobe of the liver (series 201 image 37) is not well characterized. MRI may provide better evaluation. Cholecystectomy. The pancreas is atrophic. There is an ill-defined soft tissue prominence involving the head of the pancreas at the ampulla of Vater. This area is not well  evaluated on the CT. The the capillary mass or pancreatic lesion is not excluded. Endoscopic ultrasound is recommended for better evaluation. The spleen, adrenal glands appear unremarkable. There is moderate bilateral renal atrophy. There is no hydronephrosis. The visualized ureters and urinary bladder appear unremarkable. Hysterectomy.  Extensive sigmoid diverticulosis with muscular hypertrophy. No definite active inflammatory changes. Moderate stool in the distal colon. No evidence of bowel obstruction. There is a 1.6 cm duodenal diverticulum. There is soft tissue density along the medial wall of the second portion of the duodenum (series 201, image 35). Normal appendix.  There is aortoiliac atherosclerotic disease. A 3.2 cm infrarenal abdominal aortic aneurysm. The origins of the celiac axis, SMA, IMA as well as the origins of the renal arteries appear patent. No portal venous gas identified. Ill-defined nodular soft tissue density in the region of the porta hepatis likely represent lymph nodes. There is diastases of anterior abdominal wall musculature in the midline with a small fat containing umbilical hernia. Degenerative changes of the spine. Left hip arthroplasty. No acute fracture.  IMPRESSION: Ill-defined soft tissue thickness involving the head and uncinate process of the pancreas as well as the medial wall of the second portion of the duodenum. MRI or endoscopic ultrasound is recommended for further evaluation.  Sigmoid diverticulosis. No evidence of bowel obstruction or inflammation. Normal appendix.  Incompletely characterized hypodense lesion along the inferior surface of the right lobe of the liver. MRI may provide better characterization.   Electronically Signed   By: Elgie Collard M.D.   On: 11/18/2014 01:03   Dg Chest Portable 1 View  11/17/2014   CLINICAL DATA:  81 year old female with shortness of breath and tachycardia.  EXAM: PORTABLE CHEST - 1 VIEW  COMPARISON:  Radiograph dated  10/28/2014  FINDINGS: Single-view of the chest demonstrate emphysematous changes of the lungs. There is  no focal consolidation, pleural effusion, or pneumothorax. The cardiomediastinal silhouette is within normal limits. Degenerative changes of the osseous structures.  IMPRESSION: Emphysema.  No focal consolidation or pneumothorax.   Electronically Signed   By: Elgie Collard M.D.   On: 11/17/2014 22:50     CBC  Recent Labs Lab 11/17/14 2222 11/18/14 0321  WBC 17.9* 18.2*  HGB 11.9* 9.9*  HCT 36.2 30.3*  PLT 192 155  MCV 87.7 87.8  MCH 28.8 28.7  MCHC 32.9 32.7  RDW 13.8 14.0  LYMPHSABS 0.2* 0.2*  MONOABS 0.8 1.1*  EOSABS 0.0 0.0  BASOSABS 0.0 0.0    Chemistries   Recent Labs Lab 11/17/14 2222 11/18/14 0321  NA 133* 131*  K 3.4* 3.4*  CL 87* 89*  CO2 33* 32  GLUCOSE 189* 177*  BUN 12 13  CREATININE 1.22* 1.26*  CALCIUM 10.3 9.2  AST 353* 223*  ALT 415* 300*  ALKPHOS 301* 230*  BILITOT 5.0* 4.1*   ------------------------------------------------------------------------------------------------------------------ estimated creatinine clearance is 39 mL/min (by C-G formula based on Cr of 1.26). ------------------------------------------------------------------------------------------------------------------ No results for input(s): HGBA1C in the last 72 hours. ------------------------------------------------------------------------------------------------------------------ No results for input(s): CHOL, HDL, LDLCALC, TRIG, CHOLHDL, LDLDIRECT in the last 72 hours. ------------------------------------------------------------------------------------------------------------------  Recent Labs  11/18/14 0845  TSH 0.702   ------------------------------------------------------------------------------------------------------------------ No results for input(s): VITAMINB12, FOLATE, FERRITIN, TIBC, IRON, RETICCTPCT in the last 72 hours.  Coagulation profile  Recent  Labs Lab 11/17/14 2222 11/18/14 0321  INR 1.13 1.32    No results for input(s): DDIMER in the last 72 hours.  Cardiac Enzymes  Recent Labs Lab 11/17/14 2222 11/18/14 0321 11/18/14 0845  TROPONINI 0.04* 0.12* 0.04*   ------------------------------------------------------------------------------------------------------------------ Invalid input(s): POCBNP   Time Spent in minutes  35   Tiwan Schnitker K M.D on 11/18/2014 at 1:07 PM  Between 7am to 7pm - Pager - [phone]  After 7pm go to [email or web address] - password [facility]  [facility] -  Office  [phone]

## 2014-11-18 NOTE — Progress Notes (Signed)
CRITICAL VALUE ALERT  Critical value received:  Aerobic blood culture bottle came back positive gram neg rods  Date of notification:  11/18/14  Time of notification:  2015  Critical value read back:Yes.    Nurse who received alert:  L Hitt RN  MD notified (1st page):  Rolene Course NP  Time of first page:  2016  MD notified (2nd page):  Time of second page:  Responding MD:  Merdis Delay  Time MD responded:

## 2014-11-18 NOTE — ED Notes (Signed)
Attempted report 

## 2014-11-18 NOTE — Progress Notes (Signed)
ANTIBIOTIC CONSULT NOTE - INITIAL  Pharmacy Consult for Vancomycin and Fortaz Indication: rule out sepsis  Allergies  Allergen Reactions  . Other Other (See Comments)    TETANUS-can only take 1/2 dose at one time, can take the other 1/2 dose about 3 days later  . Codeine Nausea And Vomiting  . Sulfonamide Derivatives Nausea And Vomiting  . Warfarin     H/o AVM's prechief complaintcluding anticoagulation  . Penicillins Itching, Rash and Other (See Comments)    Nasal itching   . Ranitidine Hcl Itching and Rash    Patient Measurements: Height: 6' (182.9 cm) Weight: 184 lb (83.462 kg) IBW/kg (Calculated) : 73.1  Vital Signs: Temp: 101.9 F (38.8 C) (07/23 2317) Temp Source: Rectal (07/23 2317) BP: 118/58 mmHg (07/24 0200) Pulse Rate: 125 (07/24 0200) Intake/Output from previous day: 07/23 0701 - 07/24 0700 In: -  Out: 13 [Urine:13] Intake/Output from this shift: Total I/O In: -  Out: 13 [Urine:13]  Labs:  Recent Labs  11/17/14 2222  WBC 17.9*  HGB 11.9*  PLT 192  CREATININE 1.22*   Estimated Creatinine Clearance: 40.3 mL/min (by C-G formula based on Cr of 1.22). No results for input(s): VANCOTROUGH, VANCOPEAK, VANCORANDOM, GENTTROUGH, GENTPEAK, GENTRANDOM, TOBRATROUGH, TOBRAPEAK, TOBRARND, AMIKACINPEAK, AMIKACINTROU, AMIKACIN in the last 72 hours.   Microbiology: Recent Results (from the past 720 hour(s))  Urine culture     Status: None   Collection Time: 10/28/14 10:59 AM  Result Value Ref Range Status   Specimen Description URINE, CLEAN CATCH  Final   Special Requests Normal  Final   Culture   Final    MULTIPLE SPECIES PRESENT, SUGGEST RECOLLECTION IF CLINICALLY INDICATED   Report Status 10/29/2014 FINAL  Final    Medical History: Past Medical History  Diagnosis Date  . CAD (coronary artery disease)     a. s/p CABG x5 (2005) b. s/p BMS-prox SVG-D2 (2014)  . Hypertension   . Hyperlipidemia   . GI bleed     AVMs  . Aneurysm, aortic     thoracic  aorta, stable at 4.1 cm, chest CT, May, 2012  . Syncope     Nitroglycerin plus a diuretic, April, 2009  . Hyponatremia     Chronic. Felt secondary to SIADH   . COPD (chronic obstructive pulmonary disease)   . Tobacco abuse   . Bradycardia   . Carotid artery disease     Hx of endarterectomy. Doppler October, 2011, stable, 0-39% RIC A., 40-59% LICA  . Tuberculosis     Exposures to tuberculosis 1970s, has tested negative by the health Department  . Atrial fibrillation     Paroxysmal with RVR 07/2011, not felt to be a coumadin candidate secondary to hx of GIB/AVM  . Venous stasis of lower extremity     Chronic  . Ejection fraction     a. EF 55-60%, echo, April, 2013 b. EF 55-60%, mild biatrial enlargement and PASP 42 mmH  . CHF with left ventricular diastolic dysfunction, NYHA class 2   . AAA (abdominal aortic aneurysm)   . On home O2     2L N/C  . Complication of anesthesia   . Shortness of breath   . Hx of CABG     CABG 2005  . Hematoma     Right groin hematoma, small, post cath, April, 2014  . Mitral regurgitation     Mild, hospital, March, 2014  . Leg ulcer     Ulcerated lesion on the anterior aspect of her left  lower leg, June, 2014    Medications:  Albuterol  Lipitor  Symbicort  Diltiazem  Keflex  Lasix  Toprol XL   Spiriva  Albuterol  ASA  Xanax  Novolog 70/30  Magox  Metformin  MVI  Ntg  Protonix  KCl  Vit D     Assessment: 81 y.o. female with AMS/SOB/tachycardia, possible urosepsis, for empiric antibiotics  Vancomycin 1250 mg IV given in ED at  0045  Goal of Therapy:  Vancomycin trough level 15-20 mcg/ml  Plan:  Vancomycin 1 g IV q24h Fortaz 1 g IV q12h  Holly Iannaccone, Gary Fleet 11/18/2014,2:57 AM

## 2014-11-19 ENCOUNTER — Inpatient Hospital Stay (HOSPITAL_COMMUNITY): Payer: Medicare Other

## 2014-11-19 DIAGNOSIS — R7989 Other specified abnormal findings of blood chemistry: Secondary | ICD-10-CM

## 2014-11-19 DIAGNOSIS — I509 Heart failure, unspecified: Secondary | ICD-10-CM

## 2014-11-19 DIAGNOSIS — I5032 Chronic diastolic (congestive) heart failure: Secondary | ICD-10-CM

## 2014-11-19 DIAGNOSIS — R778 Other specified abnormalities of plasma proteins: Secondary | ICD-10-CM

## 2014-11-19 DIAGNOSIS — R0602 Shortness of breath: Secondary | ICD-10-CM

## 2014-11-19 DIAGNOSIS — I4891 Unspecified atrial fibrillation: Secondary | ICD-10-CM

## 2014-11-19 DIAGNOSIS — R740 Nonspecific elevation of levels of transaminase and lactic acid dehydrogenase [LDH]: Secondary | ICD-10-CM

## 2014-11-19 DIAGNOSIS — Z9981 Dependence on supplemental oxygen: Secondary | ICD-10-CM

## 2014-11-19 DIAGNOSIS — J439 Emphysema, unspecified: Secondary | ICD-10-CM

## 2014-11-19 LAB — CBC
HEMATOCRIT: 29.9 % — AB (ref 36.0–46.0)
HEMOGLOBIN: 9.5 g/dL — AB (ref 12.0–15.0)
MCH: 28.3 pg (ref 26.0–34.0)
MCHC: 31.8 g/dL (ref 30.0–36.0)
MCV: 89 fL (ref 78.0–100.0)
Platelets: 136 10*3/uL — ABNORMAL LOW (ref 150–400)
RBC: 3.36 MIL/uL — ABNORMAL LOW (ref 3.87–5.11)
RDW: 14.1 % (ref 11.5–15.5)
WBC: 9.4 10*3/uL (ref 4.0–10.5)

## 2014-11-19 LAB — COMPREHENSIVE METABOLIC PANEL
ALK PHOS: 199 U/L — AB (ref 38–126)
ALT: 198 U/L — ABNORMAL HIGH (ref 14–54)
AST: 87 U/L — AB (ref 15–41)
Albumin: 2.8 g/dL — ABNORMAL LOW (ref 3.5–5.0)
Anion gap: 10 (ref 5–15)
BILIRUBIN TOTAL: 2.8 mg/dL — AB (ref 0.3–1.2)
BUN: 18 mg/dL (ref 6–20)
CALCIUM: 9.2 mg/dL (ref 8.9–10.3)
CO2: 28 mmol/L (ref 22–32)
CREATININE: 1.26 mg/dL — AB (ref 0.44–1.00)
Chloride: 95 mmol/L — ABNORMAL LOW (ref 101–111)
GFR calc non Af Amer: 38 mL/min — ABNORMAL LOW (ref >60)
GFR, EST AFRICAN AMERICAN: 44 mL/min — AB (ref >60)
Glucose, Bld: 117 mg/dL — ABNORMAL HIGH (ref 65–99)
POTASSIUM: 3.9 mmol/L (ref 3.5–5.1)
Sodium: 133 mmol/L — ABNORMAL LOW (ref 135–145)
Total Protein: 5.5 g/dL — ABNORMAL LOW (ref 6.5–8.1)

## 2014-11-19 LAB — GLUCOSE, CAPILLARY
GLUCOSE-CAPILLARY: 137 mg/dL — AB (ref 65–99)
GLUCOSE-CAPILLARY: 165 mg/dL — AB (ref 65–99)
GLUCOSE-CAPILLARY: 271 mg/dL — AB (ref 65–99)
Glucose-Capillary: 115 mg/dL — ABNORMAL HIGH (ref 65–99)
Glucose-Capillary: 183 mg/dL — ABNORMAL HIGH (ref 65–99)

## 2014-11-19 LAB — HEMOGLOBIN A1C
Hgb A1c MFr Bld: 5.5 % (ref 4.8–5.6)
MEAN PLASMA GLUCOSE: 111 mg/dL

## 2014-11-19 LAB — CANCER ANTIGEN 19-9: CA 19-9: 88 U/mL — ABNORMAL HIGH (ref 0–35)

## 2014-11-19 LAB — MAGNESIUM: Magnesium: 1.6 mg/dL — ABNORMAL LOW (ref 1.7–2.4)

## 2014-11-19 MED ORDER — MAGNESIUM SULFATE 2 GM/50ML IV SOLN
2.0000 g | Freq: Once | INTRAVENOUS | Status: AC
  Administered 2014-11-19: 2 g via INTRAVENOUS
  Filled 2014-11-19: qty 50

## 2014-11-19 MED ORDER — DILTIAZEM LOAD VIA INFUSION
10.0000 mg | Freq: Once | INTRAVENOUS | Status: AC
  Administered 2014-11-19: 10 mg via INTRAVENOUS
  Filled 2014-11-19: qty 10

## 2014-11-19 MED ORDER — DILTIAZEM HCL 100 MG IV SOLR
5.0000 mg/h | INTRAVENOUS | Status: AC
  Administered 2014-11-19 – 2014-11-20 (×2): 5 mg/h via INTRAVENOUS
  Filled 2014-11-19: qty 100

## 2014-11-19 MED ORDER — DEXTROSE 5 % IV SOLN
2.0000 g | Freq: Two times a day (BID) | INTRAVENOUS | Status: DC
  Administered 2014-11-19 – 2014-11-23 (×8): 2 g via INTRAVENOUS
  Filled 2014-11-19 (×11): qty 2

## 2014-11-19 MED ORDER — FUROSEMIDE 10 MG/ML IJ SOLN
40.0000 mg | Freq: Once | INTRAMUSCULAR | Status: DC
  Filled 2014-11-19: qty 4

## 2014-11-19 MED ORDER — POTASSIUM CHLORIDE IN NACL 40-0.9 MEQ/L-% IV SOLN
INTRAVENOUS | Status: DC
  Filled 2014-11-19: qty 1000

## 2014-11-19 MED ORDER — DILTIAZEM HCL 25 MG/5ML IV SOLN
10.0000 mg | Freq: Four times a day (QID) | INTRAVENOUS | Status: DC | PRN
  Filled 2014-11-19: qty 5

## 2014-11-19 MED ORDER — SODIUM CHLORIDE 0.9 % IV SOLN: INTRAVENOUS | Status: AC

## 2014-11-19 MED ORDER — METHYLPREDNISOLONE SODIUM SUCC 125 MG IJ SOLR
60.0000 mg | Freq: Three times a day (TID) | INTRAMUSCULAR | Status: DC
  Administered 2014-11-19 – 2014-11-23 (×13): 60 mg via INTRAVENOUS
  Filled 2014-11-19: qty 0.96
  Filled 2014-11-19 (×2): qty 2
  Filled 2014-11-19 (×2): qty 0.96
  Filled 2014-11-19 (×3): qty 2
  Filled 2014-11-19 (×3): qty 0.96
  Filled 2014-11-19 (×4): qty 2

## 2014-11-19 MED ORDER — METOPROLOL TARTRATE 1 MG/ML IV SOLN
5.0000 mg | Freq: Once | INTRAVENOUS | Status: AC
  Administered 2014-11-19: 5 mg via INTRAVENOUS
  Filled 2014-11-19: qty 5

## 2014-11-19 MED ORDER — METOPROLOL TARTRATE 1 MG/ML IV SOLN
2.5000 mg | Freq: Once | INTRAVENOUS | Status: AC
  Administered 2014-11-19: 2.5 mg via INTRAVENOUS
  Filled 2014-11-19: qty 5

## 2014-11-19 NOTE — Progress Notes (Signed)
Pt refuse evening Symbicort inhaler.

## 2014-11-19 NOTE — Consult Note (Signed)
Admit date: 11/17/2014 Referring Physician  Dr. Thedore Mins Primary Physician  Dr. Windle Guard Primary Cardiologist  Dr. Myrtis Ser Reason for Consultation  Afib  HPI: This is an 81yo female with a history of ASCAD s/p CABG 2005 and BMS to Prox SVG to D2 2014, HTN, dyslipidemia, COPD, bradycardia, PAF (not coumadin candidate due to h/o GIB/AVM), chronic diastolic CHF NYHA class 2 who presented with complaints of shortness of breath. Patient is a poor historian as she is unable to provide any significant history.There was no family member available at the time of my evaluation therefore the history has been obtained from ED documentation and Hospitalist note. The patient was initially brought to ER for acute encephalopathy as well as shortness of breath.  Patient mentions her daughter-in-law told that she was more short of breath.  Reportedly she was significantly weak and was not eating enough as well as was confused and was not acting herself.  On admission the patient was felt to be septic with transaminitis, lactic acidosis and leukocytosis.  CT scan of the abdomen showed a pancreatic lesion.  She was started on antibx.  She was noted on admission to be in afib with RVR and was started on IV Cardizem gtt and continued on her home dose of BB.  Yesterday am she was changed from IV Cardizem to PO and continued on Toprol.  She was noted to have a mild elevation in her troponin.  This am went back into atrial fibrillation with RVR in the 120-130's.  Cardiology is now asked to consult to help in management of her PAF.        PMH:   Past Medical History  Diagnosis Date  . CAD (coronary artery disease)     a. s/p CABG x5 (2005) b. s/p BMS-prox SVG-D2 (2014)  . Hypertension   . Hyperlipidemia   . GI bleed 2008    AVMs  . Aneurysm, aortic     thoracic aorta, stable at 4.1 cm, chest CT, May, 2012  . Syncope     Nitroglycerin plus a diuretic, April, 2009  . Hyponatremia     Chronic. Felt secondary to  SIADH   . COPD (chronic obstructive pulmonary disease)     on home oxygen  . Tobacco abuse   . Bradycardia   . Carotid artery disease     Hx of endarterectomy. Doppler October, 2011, stable, 0-39% RIC A., 40-59% LICA  . Tuberculosis     Exposures to tuberculosis 1970s, has tested negative by the health Department  . Atrial fibrillation     Paroxysmal with RVR 07/2011, not felt to be a coumadin candidate secondary to hx of GIB/AVM  . Venous stasis of lower extremity     Chronic  . CHF with left ventricular diastolic dysfunction, NYHA class 2     a. EF 55-60%, echo, April, 2013 b. EF 55-60%, mild biatrial enlargement and PASP 42 mmH  . AAA (abdominal aortic aneurysm)   . Complication of anesthesia   . Hx of CABG     CABG 2005  . Hematoma     Right groin hematoma, small, post cath, April, 2014  . Mitral regurgitation     Mild, hospital, March, 2014  . Leg ulcer     Ulcerated lesion on the anterior aspect of her left lower leg, June, 2014     PSH:   Past Surgical History  Procedure Laterality Date  . Cholecystectomy    . Carotid endartercetomy    .  Abdominal hysterectomy    . Coronary artery bypass graft  2005  . Coronary angioplasty with stent placement  07/18/12    severe native coronary artery disease, 99% proximal stenosis of the SVG-D2 status post successful PTCA/PCI with a Veriflex bare-metal stent, widely patent SVGs to both the right PDA sequential OM1 to OM 2, EF 65-70% and 3+ mitral regurgitation  . Left heart catheterization with coronary angiogram N/A 07/18/2012    Procedure: LEFT HEART CATHETERIZATION WITH CORONARY ANGIOGRAM;  Surgeon: Marykay Lex, MD;  Location: [facility] CATH LAB;  Service: Cardiovascular;  Laterality: N/A;  . Percutaneous coronary stent intervention (pci-s)  07/18/2012    Procedure: PERCUTANEOUS CORONARY STENT INTERVENTION (PCI-S);  Surgeon: Marykay Lex, MD;  Location: [facility] CATH LAB;  Service: Cardiovascular;;    Allergies:  Other; Codeine; Sulfonamide  derivatives; Warfarin; Penicillins; and Ranitidine hcl Prior to Admit Meds:   Prescriptions prior to admission  Medication Sig Dispense Refill Last Dose  . albuterol (PROVENTIL HFA;VENTOLIN HFA) 108 (90 BASE) MCG/ACT inhaler Inhale 2 puffs into the lungs every 4 (four) hours as needed for wheezing or shortness of breath. 1 Inhaler 5 2 wks ago at Unknown time  . albuterol (PROVENTIL) (2.5 MG/3ML) 0.083% nebulizer solution Take 2.5 mg by nebulization every 6 (six) hours as needed for wheezing.   11/15/2014  . aspirin EC 81 MG tablet Take 81 mg by mouth daily.   11/16/2014  . atorvastatin (LIPITOR) 40 MG tablet Take 0.5 tablets (20 mg total) by mouth daily at 6 PM. 90 tablet 1 11/16/2014  . CARTIA XT 300 MG 24 hr capsule TAKE 1 CAPSULE DAILY 30 capsule 1 11/16/2014  . diazepam (VALIUM) 2 MG tablet Take 2 mg by mouth 2 (two) times daily as needed for anxiety.    Past Week at Unknown time  . furosemide (LASIX) 40 MG tablet Take 1 tablet (40 mg total) by mouth 2 (two) times daily. 180 tablet 1 11/16/2014  . magnesium oxide (MAG-OX) 400 MG tablet Take 400 mg by mouth daily.   11/16/2014  . metFORMIN (GLUCOPHAGE) 500 MG tablet Take 500 mg by mouth 2 (two) times daily with a meal.   11/16/2014  . metoprolol succinate (TOPROL-XL) 25 MG 24 hr tablet Take 1 tablet (25 mg total) by mouth daily. 90 tablet 0 11/16/2014  . nitroGLYCERIN (NITROSTAT) 0.4 MG SL tablet Place 0.4 mg under the tongue every 5 (five) minutes as needed for chest pain.   PRN  . ondansetron (ZOFRAN) 8 MG tablet Take 1 tablet (8 mg total) by mouth every 8 (eight) hours as needed for nausea or vomiting. 20 tablet 0 2 wks ago at Unknown time  . OXYGEN Inhale into the lungs. 2L continously   Taking  . pantoprazole (PROTONIX) 40 MG tablet Take 40 mg by mouth daily.   11/16/2014  . potassium chloride SA (K-DUR,KLOR-CON) 20 MEQ tablet Take 20 mEq by mouth 2 (two) times daily.   11/15/2014  . Tiotropium Bromide Monohydrate (SPIRIVA RESPIMAT) 2.5 MCG/ACT AERS  Inhale 2 puffs into the lungs daily. 4 g 5 11/16/2014  . Vitamin D, Ergocalciferol, (DRISDOL) 50000 UNITS CAPS Take 50,000 Units by mouth every 14 (fourteen) days. Every other Sunday   11/11/2014   Fam HX:    Family History  Problem Relation Age of Onset  . Stroke Sister   . Heart failure Mother   . Cancer Sister     Breast and Lung   . Cancer Brother     breast cancer  Social HX:    History   Social History  . Marital Status: Widowed    Spouse Name: N/A  . Number of Children: 1  . Years of Education: N/A   Occupational History  . retired     [employer]   Social History Main Topics  . Smoking status: Former Smoker -- 0.50 packs/day for 59 years    Types: Cigarettes    Quit date: 02/01/2013  . Smokeless tobacco: Not on file  . Alcohol Use: No  . Drug Use: Not on file  . Sexual Activity: Not on file   Other Topics Concern  . Not on file   Social History Narrative     ROS:  All 11 ROS were addressed and are negative except what is stated in the HPI  Physical Exam: Blood pressure 135/70, pulse 116, temperature 98.9 F (37.2 C), temperature source Oral, resp. rate 16, height 6' (1.829 m), weight 190 lb 11.2 oz (86.5 kg), SpO2 98 %.    General: Well developed, well nourished, in no acute distress Head: Eyes PERRLA, No xanthomas.   Normal cephalic and atramatic  Lungs:   Clear bilaterally to auscultation and percussion. Heart:   Irregularly irregular S1 S2 Pulses are 2+ & equal.            No carotid bruit. No JVD.  No abdominal bruits. No femoral bruits. Abdomen: Bowel sounds are positive, abdomen soft and non-tender without masses Extremities:   No clubbing, cyanosis or edema.  DP +1 Neuro: Alert and oriented X 3. Psych:  Good affect, responds appropriately    Labs:   Lab Results  Component Value Date   WBC 9.4 11/19/2014   HGB 9.5* 11/19/2014   HCT 29.9* 11/19/2014   MCV 89.0 11/19/2014   PLT 136* 11/19/2014    Recent Labs Lab 11/19/14 0235  NA 133*  K  3.9  CL 95*  CO2 28  BUN 18  CREATININE 1.26*  CALCIUM 9.2  PROT 5.5*  BILITOT 2.8*  ALKPHOS 199*  ALT 198*  AST 87*  GLUCOSE 117*   No results found for: PTT Lab Results  Component Value Date   INR 1.32 11/18/2014   INR 1.13 11/17/2014   INR 1.04 07/18/2012   Lab Results  Component Value Date   CKTOTAL 69 09/25/2010   CKMB 5.4* 09/25/2010   TROPONINI 0.04* 11/18/2014     Lab Results  Component Value Date   CHOL 121 07/19/2012   CHOL  08/21/2007    153        ATP III CLASSIFICATION:  <200     mg/dL   Desirable  161-096  mg/dL   Borderline High  >=045    mg/dL   High   Lab Results  Component Value Date   HDL 39* 07/19/2012   HDL 42 08/21/2007   Lab Results  Component Value Date   LDLCALC 62 07/19/2012   LDLCALC  08/21/2007    81        Total Cholesterol/HDL:CHD Risk Coronary Heart Disease Risk Table                     Men   Women  1/2 Average Risk   3.4   3.3   Lab Results  Component Value Date   TRIG 102 07/19/2012   TRIG 148 08/21/2007   Lab Results  Component Value Date   CHOLHDL 3.1 07/19/2012   CHOLHDL 3.6 08/21/2007   No results found for: LDLDIRECT  Radiology:  Mr Abdomen Wo Contrast  11/18/2014   CLINICAL DATA:  Elevated liver function tests. Remote history of breast cancer. Question of duodenal or pancreatic mass on prior exam.  EXAM: MRI ABDOMEN WITHOUT CONTRAST  TECHNIQUE: Multiplanar multisequence MR imaging was performed without the administration of intravenous contrast.  COMPARISON:  CT abdomen/pelvis 11/18/2014 and 05/04/2014, chest CT with incomplete upper abdominal imaging 09/25/2010  FINDINGS: Contrast was not administered because the patient was unable to remain still and requested that the examination be terminated. There is significant motion artifact on multiple imaging sequences.  There is mild central intrahepatic ductal dilatation and mild periportal edema. Several too small to characterize hepatic T2 hyperintense lesions are  most likely cysts. There is trace porta hepatis stranding/fluid but no definable collection is identified. Again noted is suggestion of irregular soft tissue at the porta hepatis just inferior to the portal vein but this cannot be measured with accuracy due to motion artifact on the current exam.  The common duct is normal in caliber and no filling defect is identified.  The pancreas is unremarkable without pancreatic ductal dilatation.  Adrenal glands are normal. Probable subcentimeter left renal cortical cyst. No hydronephrosis.  No measurable lymphadenopathy or aortic aneurysm. Artifact from sternal wires partly visualized.  IMPRESSION: Minimal periportal edema and central mild intrahepatic ductal dilatation. No hepatic mass is identified. Evaluation is limited by patient motion and lack of ability to administer contrast. There is a persisting question of soft tissue mass within the porta hepatis inferior to the portal vein which could be a lymph node or mass producing minimal central obstruction. Other etiologies could include stricture, occult stone, or bile duct mass not visualized. Groove pancreatitis is felt less likely because the patient reportedly has normal pancreatic enzymes at recent lab values.  No gross evidence for pancreatic mass.  Findings discussed in exam reviewed with Dr. Juanda Chance by Dr. Chilton Si on 11/18/2014 at 12:40 p.m.   Electronically Signed   By: Christiana Pellant M.D.   On: 11/18/2014 12:46   Ct Abdomen Pelvis W Contrast  11/18/2014   CLINICAL DATA:  81 year old female with fever and abdominal pain.  EXAM: CT ABDOMEN AND PELVIS WITH CONTRAST  TECHNIQUE: Multidetector CT imaging of the abdomen and pelvis was performed using the standard protocol following bolus administration of intravenous contrast.  CONTRAST:  80mL OMNIPAQUE IOHEXOL 300 MG/ML  SOLN  COMPARISON:  CT dated 05/04/2014 and ultrasound dated 10/28/2014  FINDINGS: Evaluation is limited due to streak artifact caused by patient's  arms and metallic left hip arthroplasty.  Emphysematous changes of the lung bases. There is coronary vascular calcification. No intra-abdominal free air or free fluid.  There is mild hepatic surface irregularity. A 1.6 x 1.0 cm hypodense lesion in the inferior right lobe of the liver (series 201 image 37) is not well characterized. MRI may provide better evaluation. Cholecystectomy. The pancreas is atrophic. There is an ill-defined soft tissue prominence involving the head of the pancreas at the ampulla of Vater. This area is not well evaluated on the CT. The the capillary mass or pancreatic lesion is not excluded. Endoscopic ultrasound is recommended for better evaluation. The spleen, adrenal glands appear unremarkable. There is moderate bilateral renal atrophy. There is no hydronephrosis. The visualized ureters and urinary bladder appear unremarkable. Hysterectomy.  Extensive sigmoid diverticulosis with muscular hypertrophy. No definite active inflammatory changes. Moderate stool in the distal colon. No evidence of bowel obstruction. There is a 1.6 cm duodenal diverticulum. There is soft tissue density  along the medial wall of the second portion of the duodenum (series 201, image 35). Normal appendix.  There is aortoiliac atherosclerotic disease. A 3.2 cm infrarenal abdominal aortic aneurysm. The origins of the celiac axis, SMA, IMA as well as the origins of the renal arteries appear patent. No portal venous gas identified. Ill-defined nodular soft tissue density in the region of the porta hepatis likely represent lymph nodes. There is diastases of anterior abdominal wall musculature in the midline with a small fat containing umbilical hernia. Degenerative changes of the spine. Left hip arthroplasty. No acute fracture.  IMPRESSION: Ill-defined soft tissue thickness involving the head and uncinate process of the pancreas as well as the medial wall of the second portion of the duodenum. MRI or endoscopic ultrasound  is recommended for further evaluation.  Sigmoid diverticulosis. No evidence of bowel obstruction or inflammation. Normal appendix.  Incompletely characterized hypodense lesion along the inferior surface of the right lobe of the liver. MRI may provide better characterization.   Electronically Signed   By: Elgie Collard M.D.   On: 11/18/2014 01:03   Dg Chest Port 1 View  11/19/2014   CLINICAL DATA:  Shortness of breath, history of COPD on home oxygen, history of CHF and CABG, previous tobacco use.  EXAM: PORTABLE CHEST - 1 VIEW  COMPARISON:  Portable chest x-ray of November 17, 2014  FINDINGS: The lungs remain hyperinflated. There is no focal infiltrate. The cardiac silhouette remains enlarged. The pulmonary vascularity is prominent centrally. There is no pulmonary interstitial edema. There are post CABG changes. There is no pleural effusion. The bony thorax exhibits no acute abnormality.  IMPRESSION: COPD with low-grade compensated CHF. There is no pulmonary edema or pneumonia.   Electronically Signed   By: David  [place] M.D.   On: 11/19/2014 07:59   Dg Chest Portable 1 View  11/17/2014   CLINICAL DATA:  81 year old female with shortness of breath and tachycardia.  EXAM: PORTABLE CHEST - 1 VIEW  COMPARISON:  Radiograph dated 10/28/2014  FINDINGS: Single-view of the chest demonstrate emphysematous changes of the lungs. There is no focal consolidation, pleural effusion, or pneumothorax. The cardiomediastinal silhouette is within normal limits. Degenerative changes of the osseous structures.  IMPRESSION: Emphysema.  No focal consolidation or pneumothorax.   Electronically Signed   By: Elgie Collard M.D.   On: 11/17/2014 22:50    EKG:  afib with RVR, occasional aberration and LAFB  ASSESSMENT/PLAN: 1.  Atrial fibrillation with RVR of unknown duration.  She was in afib on arrival in ER.  HR initially slowed on Cardizem gtt but now rapid again after starting PO Cardizem.  This is probably being driven by  acute illness.  She has been deemed not to be a candidate for long term anticoagulation in the past due to GIB/AVMs.  Recommend placing back on IV Cardizem gtt and stopping PO cardizem until acute illness resolved.  Would avoid amio unless absolutely necessary given underlying elevated LFTs.   2.  Elevated troponin most consistent with demand ischemia in the setting of sepsis and intraabdominal process.  She has had some discomfort but this is in the epigastric area and most likely related to underlying abdominal process.  The trend is flat and is not consistent with an acute coronary syndrome.  EKG is nonischemic. 2D echo pending.   3.  ASCAD with remote CABG and BMS to prox SVG to D2 2014.  Continue ASA/statin 4.  Chronic diastolic CHF NYHA class 2 - appears euvolemic on exam.  Lasix on hold due to sepsis 5.  Sepsis ? From intraabdominal process with elevated LFTs/pancreatic mass 6.  HTN controlled   Quintella Reichert, MD  11/19/2014  10:08 AM

## 2014-11-19 NOTE — Clinical Social Work Note (Signed)
CSW Consult Acknowledged:   CSW received a consult for muitiple services. CSW left a voice message for the pt's daughter. CSW is awaiting a call back.       Damyia Strider, MSW, LCSWA [phone]

## 2014-11-19 NOTE — Clinical Social Work Note (Signed)
Clinical Social Work Assessment  Patient Details  Name: [name] MRN: [id number] Date of Birth: 1929-08-15  Date of referral:  11/19/14               Reason for consult:  Facility Placement              Housing/Transportation Living arrangements for the past 2 months:  Single Family Home Source of Information:  Adult Children [name], daughter-in-law) Patient Interpreter Needed:  None Criminal Activity/Legal Involvement Pertinent to Current Situation/Hospitalization:   No  Significant Relationships:  Adult Children Lives with:  Self Do you feel safe going back to the place where you live?  No Need for family participation in patient care:   Yes   Care giving concerns:  [name] expressed concerns regarding the pt's medical status.    Social Worker assessment / plan:  CSW spoke with the pt's daughter-in-law [name]. CSW introduced self and purpose of the call. CSW discussed SNF rehab. CSW explained the SNF process. CSW explained insurance and its relation to SNF placement. [name] declined SNF list. [name] reported that she would like pt to go to [facility]. [name] reported that the pt has been to [facility] twice in the past. CSW answered all questions in which the [name] inquired about. CSW will continue to follow this pt and assist with discharge as needed.   Employment status:  Disabled (Comment on whether or not currently receiving Disability) Insurance information:  Managed Medicare PT Recommendations:  Skilled Nursing Facility Information / Referral to community resources:  Skilled Nursing Facility  Patient/Family's Response to care: [name] reported that the care in which the pt has received has been well.   Patient/Family's Understanding of and Emotional Response to Diagnosis, Current Treatment, and Prognosis: [name] reported needing a better understanding of the pt's prognosis. [name] reported that she will arrive at the hospital early in the morning to meet with the doctor.    Emotional Assessment Appearance:   (Uable to Assess ) Attitude/Demeanor/Rapport:  Unable to Assess Affect (typically observed):  Unable to Assess Orientation:  Oriented to Self, Oriented to Place Alcohol / Substance use:  Not Applicable Psych involvement (Current and /or in the community):  No (Comment)  Discharge Needs  Concerns to be addressed:  Denies Needs/Concerns at this time Readmission within the last 30 days:  No Current discharge risk:  None Barriers to Discharge:  No Barriers Identified   Cobe Viney, LCSW 11/19/2014, 3:13 PM

## 2014-11-19 NOTE — Care Management Note (Signed)
Case Management Note  Patient Details  Name: [name] MRN: [id number] Date of Birth: [birth year]  Subjective/Objective:                 Admitted with sepsis   Action/Plan: SNF, CM to f/u with d/c disposition.  Expected Discharge Date:  11/19/14               Expected Discharge Plan:  Skilled Nursing Facility  In-House Referral:  Clinical Social Work  Discharge planning Services  CM Consult  Post Acute Care Choice:    Choice offered to:     DME Arranged:    DME Agency:     HH Arranged:    HH Agency:     Status of Service:  In process, will continue to follow  Medicare Important Message Given:    Date Medicare IM Given:    Medicare IM give by:    Date Additional Medicare IM Given:    Additional Medicare Important Message give by:     If discussed at Long Length of Stay Meetings, dates discussed:    Additional Comments:[name] [phone], [name] (Daughter) [phone]    Epifanio Lesches, RN 11/19/2014, 11:03 PM

## 2014-11-19 NOTE — Progress Notes (Signed)
ANTIBIOTIC CONSULT NOTE - FOLLOW UP  Pharmacy Consult for Ceftazidime Indication: Bacteremia / Urosepsis  Allergies  Allergen Reactions  . Other Other (See Comments)    TETANUS-can only take 1/2 dose at one time, can take the other 1/2 dose about 3 days later  . Codeine Nausea And Vomiting  . Sulfonamide Derivatives Nausea And Vomiting  . Warfarin     H/o AVM's prechief complaintcluding anticoagulation  . Penicillins Itching, Rash and Other (See Comments)    Nasal itching   . Ranitidine Hcl Itching and Rash    Patient Measurements: Height: 6' (182.9 cm) Weight: 190 lb 11.2 oz (86.5 kg) IBW/kg (Calculated) : 73.1  Vital Signs: Temp: 98.6 F (37 C) (07/25 1200) Temp Source: Oral (07/25 1200) BP: 117/72 mmHg (07/25 1200) Pulse Rate: 96 (07/25 1200) Intake/Output from previous day: 07/24 0701 - 07/25 0700 In: 2110 [P.O.:120; I.V.:1540; IV Piggyback:450] Out: 1100 [Urine:1100] Intake/Output from this shift: Total I/O In: 120 [P.O.:120] Out: -   Labs:  Recent Labs  11/17/14 2222 11/18/14 0321 11/18/14 1510 11/19/14 0235  WBC 17.9* 18.2*  --  9.4  HGB 11.9* 9.9*  --  9.5*  PLT 192 155  --  136*  LABCREA  --   --  149.81  --   CREATININE 1.22* 1.26*  --  1.26*   Estimated Creatinine Clearance: 39 mL/min (by C-G formula based on Cr of 1.26). No results for input(s): VANCOTROUGH, VANCOPEAK, VANCORANDOM, GENTTROUGH, GENTPEAK, GENTRANDOM, TOBRATROUGH, TOBRAPEAK, TOBRARND, AMIKACINPEAK, AMIKACINTROU, AMIKACIN in the last 72 hours.   Microbiology: Recent Results (from the past 720 hour(s))  Urine culture     Status: None   Collection Time: 10/28/14 10:59 AM  Result Value Ref Range Status   Specimen Description URINE, CLEAN CATCH  Final   Special Requests Normal  Final   Culture   Final    MULTIPLE SPECIES PRESENT, SUGGEST RECOLLECTION IF CLINICALLY INDICATED   Report Status 10/29/2014 FINAL  Final  Urine culture     Status: None (Preliminary result)   Collection  Time: 11/17/14 11:20 PM  Result Value Ref Range Status   Specimen Description URINE, CATHETERIZED  Final   Special Requests NONE  Final   Culture >=100,000 COLONIES/mL PSEUDOMONAS AERUGINOSA  Final   Report Status PENDING  Incomplete  Blood culture (routine x 2)     Status: None (Preliminary result)   Collection Time: 11/18/14 12:37 AM  Result Value Ref Range Status   Specimen Description BLOOD RIGHT WRIST  Final   Special Requests BOTTLES DRAWN AEROBIC AND ANAEROBIC 5CC  Final   Culture  Setup Time   Final    GRAM NEGATIVE RODS AEROBIC BOTTLE ONLY CRITICAL RESULT CALLED TO, READ BACK BY AND VERIFIED WITH: L HITT,RN 07.24.16 2015 M SHIPMAN CONFIRMED BY M CAMPBELL    Culture GRAM NEGATIVE RODS  Final   Report Status PENDING  Incomplete  Blood culture (routine x 2)     Status: None (Preliminary result)   Collection Time: 11/18/14 12:38 AM  Result Value Ref Range Status   Specimen Description BLOOD LEFT ARM  Final   Special Requests BOTTLES DRAWN AEROBIC AND ANAEROBIC 5CC  Final   Culture  Setup Time   Final    GRAM NEGATIVE RODS AEROBIC BOTTLE ONLY CRITICAL RESULT CALLED TO, READ BACK BY AND VERIFIED WITH: HITT,L RN 11/18/14 2015 WOOTEN,K CONFIRMED BY K BARR    Culture GRAM NEGATIVE RODS  Final   Report Status PENDING  Incomplete  MRSA PCR Screening  Status: None   Collection Time: 11/18/14  4:29 AM  Result Value Ref Range Status   MRSA by PCR NEGATIVE NEGATIVE Final    Comment:        The GeneXpert MRSA Assay (FDA approved for NASAL specimens only), is one component of a comprehensive MRSA colonization surveillance program. It is not intended to diagnose MRSA infection nor to guide or monitor treatment for MRSA infections.   Urine culture     Status: None (Preliminary result)   Collection Time: 11/18/14  3:10 PM  Result Value Ref Range Status   Specimen Description URINE, RANDOM  Final   Special Requests NONE  Final   Culture CULTURE REINCUBATED FOR BETTER GROWTH   Final   Report Status PENDING  Incomplete    Anti-infectives    Start     Dose/Rate Route Frequency Ordered Stop   11/19/14 2200  cefTAZidime (FORTAZ) 2 g in dextrose 5 % 50 mL IVPB     2 g 100 mL/hr over 30 Minutes Intravenous Every 12 hours 11/19/14 1307     11/18/14 1800  vancomycin (VANCOCIN) IVPB 1000 mg/200 mL premix  Status:  Discontinued     1,000 mg 200 mL/hr over 60 Minutes Intravenous Every 24 hours 11/18/14 0313 11/19/14 0707   11/18/14 1400  metroNIDAZOLE (FLAGYL) IVPB 500 mg     500 mg 100 mL/hr over 60 Minutes Intravenous Every 8 hours 11/18/14 1320     11/18/14 0330  cefTAZidime (FORTAZ) 1 g in dextrose 5 % 50 mL IVPB  Status:  Discontinued     1 g 100 mL/hr over 30 Minutes Intravenous Every 12 hours 11/18/14 0313 11/19/14 1307   11/18/14 0300  levofloxacin (LEVAQUIN) IVPB 750 mg     750 mg 100 mL/hr over 90 Minutes Intravenous  Once 11/18/14 0246 11/18/14 0452   11/18/14 0300  vancomycin (VANCOCIN) IVPB 1000 mg/200 mL premix  Status:  Discontinued     1,000 mg 200 mL/hr over 60 Minutes Intravenous  Once 11/18/14 0246 11/18/14 0247   11/17/14 2345  imipenem-cilastatin (PRIMAXIN) 500 mg in sodium chloride 0.9 % 100 mL IVPB  Status:  Discontinued     500 mg 200 mL/hr over 30 Minutes Intravenous  Once 11/17/14 2338 11/18/14 0246   11/17/14 2315  piperacillin-tazobactam (ZOSYN) IVPB 3.375 g  Status:  Discontinued     3.375 g 100 mL/hr over 30 Minutes Intravenous  Once 11/17/14 2301 11/17/14 2302   11/17/14 2315  piperacillin-tazobactam (ZOSYN) IVPB 3.375 g  Status:  Discontinued     3.375 g 100 mL/hr over 30 Minutes Intravenous  Once 11/17/14 2302 11/17/14 2308   11/17/14 2315  vancomycin (VANCOCIN) 1,250 mg in sodium chloride 0.9 % 250 mL IVPB     1,250 mg 166.7 mL/hr over 90 Minutes Intravenous  Once 11/17/14 2305 11/18/14 0321      Assessment: 81 y.o. female with AMS/SOB/tachycardia. Day #2 of abx for bacteremia and urosepsis. Blood cx's are 2/2 for GNRs and  Urine is showing > 100K pseudomonas. Currently afebrile and WBC wnl. SCr 1.26, CrCl ~35ml/min.  Goal of Therapy:  Resolution of infection  Plan:  Change ceftazidime to 2g IV Q12 Continue flagyl  IV Q8 per MD Monitor clinical picture, renal function F/U C&S, abx deescalation / LOT  Virgal [place] J 11/19/2014,1:07 PM

## 2014-11-19 NOTE — Evaluation (Signed)
Physical Therapy Evaluation Patient Details Name: [name] MRN: [id number] DOB: 07/26/1929 Today's Date: 11/19/2014   History of Present Illness  Patient is a 81 y/o female with hx of CAD s/p CABG, GI bleed, A-fib not on any anticoagulation, HTN, PVD, COPD, chronic respiratory failure on oxygen, chronic diastolic dysfunction presents with SOB. Admited with sepsis w/ unclear presentation with generalized weakness, leukocytosis, encephalopathy. 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 Workup pending.    Clinical Impression  Patient presents with fatigue, generalized weakness, impaired endurance and decreased activity tolerance impacting safe mobility. Pt with abnormally elevated HR, A-fib, with minimal activity during session from 95-143 bpm. Pt is alone most of the day as grandson works but son does live behind pt. Recommend ST SNF to maximize independence and mobility prior to return home. Pt reluctant to go to rehab and if decides to return home will need 24/7 S and HHPT.   Follow Up Recommendations SNF;Supervision/Assistance - 24 hour    Equipment Recommendations  None recommended by PT    Recommendations for Other Services       Precautions / Restrictions Precautions Precautions: Fall Precaution Comments: monitor HR Restrictions Weight Bearing Restrictions: No      Mobility  Bed Mobility               General bed mobility comments: Sitting in chair upon PT arrival.   Transfers Overall transfer level: Needs assistance Equipment used: Rolling walker (2 wheeled) Transfers: Sit to/from Stand Sit to Stand: Min assist         General transfer comment: Min A to boost to standing position x2 from chair.  Ambulation/Gait Ambulation/Gait assistance: Min guard Ambulation Distance (Feet): 12 Feet (x2 bouts) Assistive device: Rolling walker (2 wheeled) Gait Pattern/deviations: Step-through pattern;Decreased stride length;Trunk flexed   Gait  velocity interpretation: <1.8 ft/sec, indicative of risk for recurrent falls General Gait Details: Slow, guarded gait. Dyspnea present 3/4. HR ranged from 94-143 bpm A-fib.  Stairs            Wheelchair Mobility    Modified Rankin (Stroke Patients Only)       Balance Overall balance assessment: Needs assistance Sitting-balance support: Feet supported;No upper extremity supported Sitting balance-Leahy Scale: Fair     Standing balance support: During functional activity Standing balance-Leahy Scale: Poor Standing balance comment: Relient on RW for support.                              Pertinent Vitals/Pain Pain Assessment: No/denies pain    Home Living Family/patient expects to be discharged to:: Private residence Living Arrangements: Other relatives (grandson) Available Help at Discharge: Available PRN/intermittently Type of Home: House Home Access: Ramped entrance     Home Layout: One level Home Equipment: Cane - single point;Hospital bed;Walker - 2 wheels;Walker - standard;Walker - 4 wheels;Bedside commode;Shower seat;Grab bars - tub/shower;Other (comment)      Prior Function Level of Independence: Independent with assistive device(s)         Comments: amb with rollator, states she sponge bathes and dresses on her own     Hand Dominance   Dominant Hand: Right    Extremity/Trunk Assessment   Upper Extremity Assessment: Defer to OT evaluation           Lower Extremity Assessment: Generalized weakness         Communication   Communication: No difficulties  Cognition Arousal/Alertness: Awake/alert Behavior During Therapy: WFL for tasks  assessed/performed Overall Cognitive Status: No family/caregiver present to determine baseline cognitive functioning                      General Comments      Exercises        Assessment/Plan    PT Assessment Patient needs continued PT services  PT Diagnosis Difficulty  walking;Generalized weakness   PT Problem List Decreased strength;Cardiopulmonary status limiting activity;Decreased activity tolerance;Decreased balance;Decreased mobility;Decreased safety awareness  PT Treatment Interventions Balance training;Gait training;Functional mobility training;Therapeutic activities;Therapeutic exercise;Patient/family education   PT Goals (Current goals can be found in the Care Plan section) Acute Rehab PT Goals Patient Stated Goal: to feel better and go home PT Goal Formulation: With patient Time For Goal Achievement: 12/03/14 Potential to Achieve Goals: Good    Frequency Min 3X/week   Barriers to discharge Decreased caregiver support Pt alone during the day as grandson works. Reports son lives in house behind her.    Co-evaluation               End of Session Equipment Utilized During Treatment: Gait belt;Oxygen Activity Tolerance: Patient limited by fatigue Patient left: in chair;with call bell/phone within reach;with chair alarm set Nurse Communication: Mobility status         Time: 1050-1110 PT Time Calculation (min) (ACUTE ONLY): 20 min   Charges:   PT Evaluation $Initial PT Evaluation Tier I: 1 Procedure     PT G Codes:        [name] 11/19/2014, 11:43 AM  Mylo Red, PT, DPT [phone]

## 2014-11-19 NOTE — Progress Notes (Signed)
Patient Demographics:    [name], is a 81 y.o. female, DOB - 1929/06/07, QMV:[id number]  Admit date - 11/17/2014   Admitting Physician Rolly Salter, MD  Outpatient Primary MD for the patient is Kaleen Mask, MD  LOS - 1   Chief Complaint  Patient presents with  . Altered Mental Status  . Tachycardia        Subjective:    [name] today has, No headache, No chest pain, No abdominal pain - No Nausea, No new weakness tingling or numbness, mild dry cough with shortness of breath   Assessment  & Plan :    1.? Sepsis - unclear presentation with generalized weakness, leukocytosis, encephalopathy. Does have elevated liver enzymes, does have question of a pancreatic mass - question if she has mild cholangitis, no abdominal pain, Blood cultures 2/2 growing gram-negative rods. CT scan abdomen and pelvis and MRI abdomen both inconclusive. GI has been consulted. Further workup per GI. Might need EUS.  We'll stop vancomycin, on Flagyl along with [place]. Will continue till cultures are finalized.   2. Elevated liver enzymes with questionable pancreatic mass on CT and MRI abdomen. Could have mild cholangitis as she had leukocytosis and encephalopathy on admission, anti-biotics as above, may need ERCP. Plan as above.   3. COPD. On home oxygen, currently at baseline, no wheezing on exam, but minimal movement, still complaining of some shortness of breath will give her a trial of IV Steroid, CXR non acute, Continue supportive care with oxygen nebulizer treatments as needed.   4. Atrial fibrillation chronic in RVR upon admission. [place] Vasc of greater than 3. Now in rate control on IV Cardizem drip, Cards following, continue Toprol-XL. Not on anticoagulation due to previous GI bleed.   5. CAD  with history of chronic diastolic CHF last EF of 55% in 2014. Clinically compensated from CHF standpoint, chest pain-free, mild elevation of troponin not in ACS pattern. Continue aspirin, beta blocker and statin for secondary prevention. Pending Repeat echogram to evaluate wall motion.   6.Hyponatremia. Was dehydrated as her oral intake prior to admission was poor, improved after hydration with IV fluids continue gentle IV fluids.   7. Hypokalemia. Replaced.   8. GERD. On PPI continue.   9.  Dyslipidemia. On home dose statin continue.    10. Encephalopathy due to #1 above. Much improved, continue supportive care, at risk for delirium. Minimize narcotics and benzodiazepine's.    11.DM type II. Hold Glucophage, on sliding scale monitor CBGs.  CBG (last 3)   Recent Labs  11/18/14 2124 11/19/14 0750 11/19/14 0759  GLUCAP 115* 115* 137*      Code Status : Full  Family Communication  : daughter in law over there phone, told prognosis guarded she understands  Disposition Plan  : To be decided  Consults  : GI, Crds  Procedures  :   CT Abd - ? Pancreatic mass/edema  MRI ABD - ? Pancreatic mass  TTE  DVT Prophylaxis  :   Heparin   Lab Results  Component Value Date   PLT 136* 11/19/2014    Inpatient Medications  Scheduled Meds: . antiseptic oral rinse  7 mL Mouth Rinse BID  . aspirin EC  81 mg Oral Daily  .  atorvastatin  20 mg Oral q1800  . budesonide-formoterol  2 puff Inhalation BID  . cefTAZidime (FORTAZ)  IV  1 g Intravenous Q12H  . diltiazem  10 mg Intravenous Once  . furosemide  40 mg Intravenous Once  . heparin  5,000 Units Subcutaneous 3 times per day  . insulin aspart  0-15 Units Subcutaneous TID WC  . insulin aspart  0-5 Units Subcutaneous QHS  . methylPREDNISolone (SOLU-MEDROL) injection  60 mg Intravenous 3 times per day  . metoprolol succinate  25 mg Oral Daily  . metronidazole  500 mg Intravenous Q8H  . pantoprazole  40 mg Oral Daily  .  sodium chloride  3 mL Intravenous Q12H  . tiotropium  1 capsule Inhalation Daily   Continuous Infusions: . diltiazem (CARDIZEM) infusion     PRN Meds:.acetaminophen **OR** acetaminophen, ALPRAZolam, diltiazem, ondansetron **OR** ondansetron (ZOFRAN) IV  Antibiotics  :    Anti-infectives    Start     Dose/Rate Route Frequency Ordered Stop   11/18/14 1800  vancomycin (VANCOCIN) IVPB 1000 mg/200 mL premix  Status:  Discontinued     1,000 mg 200 mL/hr over 60 Minutes Intravenous Every 24 hours 11/18/14 0313 11/19/14 0707   11/18/14 1400  metroNIDAZOLE (FLAGYL) IVPB 500 mg     500 mg 100 mL/hr over 60 Minutes Intravenous Every 8 hours 11/18/14 1320     11/18/14 0330  cefTAZidime (FORTAZ) 1 g in dextrose 5 % 50 mL IVPB     1 g 100 mL/hr over 30 Minutes Intravenous Every 12 hours 11/18/14 0313     11/18/14 0300  levofloxacin (LEVAQUIN) IVPB 750 mg     750 mg 100 mL/hr over 90 Minutes Intravenous  Once 11/18/14 0246 11/18/14 0452   11/18/14 0300  vancomycin (VANCOCIN) IVPB 1000 mg/200 mL premix  Status:  Discontinued     1,000 mg 200 mL/hr over 60 Minutes Intravenous  Once 11/18/14 0246 11/18/14 0247   11/17/14 2345  imipenem-cilastatin (PRIMAXIN) 500 mg in sodium chloride 0.9 % 100 mL IVPB  Status:  Discontinued     500 mg 200 mL/hr over 30 Minutes Intravenous  Once 11/17/14 2338 11/18/14 0246   11/17/14 2315  piperacillin-tazobactam (ZOSYN) IVPB 3.375 g  Status:  Discontinued     3.375 g 100 mL/hr over 30 Minutes Intravenous  Once 11/17/14 2301 11/17/14 2302   11/17/14 2315  piperacillin-tazobactam (ZOSYN) IVPB 3.375 g  Status:  Discontinued     3.375 g 100 mL/hr over 30 Minutes Intravenous  Once 11/17/14 2302 11/17/14 2308   11/17/14 2315  vancomycin (VANCOCIN) 1,250 mg in sodium chloride 0.9 % 250 mL IVPB     1,250 mg 166.7 mL/hr over 90 Minutes Intravenous  Once 11/17/14 2305 11/18/14 0321        Objective:   Filed Vitals:   11/19/14 0402 11/19/14 0500 11/19/14 0800 11/19/14  0928  BP: 116/58  135/70   Pulse: 148  116   Temp:   98.9 F (37.2 C)   TempSrc:   Oral   Resp:      Height:      Weight:  86.5 kg (190 lb 11.2 oz)    SpO2: 97%  99% 98%    Wt Readings from Last 3 Encounters:  11/19/14 86.5 kg (190 lb 11.2 oz)  07/16/14 83.915 kg (185 lb)  05/08/14 86.8 kg (191 lb 5.8 oz)     Intake/Output Summary (Last 24 hours) at 11/19/14 1059 Last data filed at 11/19/14 9604  Gross  per 24 hour  Intake   1860 ml  Output   1100 ml  Net    760 ml     Physical Exam  Awake Alert, Oriented X 3, No new F.N deficits, Normal affect [place].AT,PERRAL Supple Neck,No JVD, No cervical lymphadenopathy appriciated.  Symmetrical Chest wall movement, moderate air movement bilaterally, CTAB RRR,No Gallops,Rubs or new Murmurs, No Parasternal Heave +ve B.Sounds, Abd Soft, No tenderness, No organomegaly appriciated, No rebound - guarding or rigidity. No Cyanosis, Clubbing or edema, No new Rash or bruise       Data Review:   Micro Results Recent Results (from the past 240 hour(s))  Blood culture (routine x 2)     Status: None (Preliminary result)   Collection Time: 11/18/14 12:37 AM  Result Value Ref Range Status   Specimen Description BLOOD RIGHT WRIST  Final   Special Requests BOTTLES DRAWN AEROBIC AND ANAEROBIC 5CC  Final   Culture  Setup Time   Final    GRAM NEGATIVE RODS AEROBIC BOTTLE ONLY CRITICAL RESULT CALLED TO, READ BACK BY AND VERIFIED WITH: L HITT,RN 07.24.16 2015 M SHIPMAN CONFIRMED BY M CAMPBELL    Culture GRAM NEGATIVE RODS  Final   Report Status PENDING  Incomplete  Blood culture (routine x 2)     Status: None (Preliminary result)   Collection Time: 11/18/14 12:38 AM  Result Value Ref Range Status   Specimen Description BLOOD LEFT ARM  Final   Special Requests BOTTLES DRAWN AEROBIC AND ANAEROBIC 5CC  Final   Culture  Setup Time   Final    GRAM NEGATIVE RODS AEROBIC BOTTLE ONLY CRITICAL RESULT CALLED TO, READ BACK BY AND VERIFIED WITH: HITT,L  RN 11/18/14 2015 WOOTEN,K CONFIRMED BY K BARR    Culture GRAM NEGATIVE RODS  Final   Report Status PENDING  Incomplete  MRSA PCR Screening     Status: None   Collection Time: 11/18/14  4:29 AM  Result Value Ref Range Status   MRSA by PCR NEGATIVE NEGATIVE Final    Comment:        The GeneXpert MRSA Assay (FDA approved for NASAL specimens only), is one component of a comprehensive MRSA colonization surveillance program. It is not intended to diagnose MRSA infection nor to guide or monitor treatment for MRSA infections.     Radiology Reports Dg Chest 2 View  10/28/2014   CLINICAL DATA:  Shortness of breath  EXAM: CHEST  2 VIEW  COMPARISON:  07/16/2014  FINDINGS: There is hyperinflation of the lungs compatible with COPD. Prior CABG. Cardiomegaly. Lungs are clear. No effusions. No acute bony abnormality.  IMPRESSION: COPD, cardiomegaly.  No active disease.   Electronically Signed   By: Charlett Nose M.D.   On: 10/28/2014 10:23   Mr Abdomen Wo Contrast  11/18/2014   CLINICAL DATA:  Elevated liver function tests. Remote history of breast cancer. Question of duodenal or pancreatic mass on prior exam.  EXAM: MRI ABDOMEN WITHOUT CONTRAST  TECHNIQUE: Multiplanar multisequence MR imaging was performed without the administration of intravenous contrast.  COMPARISON:  CT abdomen/pelvis 11/18/2014 and 05/04/2014, chest CT with incomplete upper abdominal imaging 09/25/2010  FINDINGS: Contrast was not administered because the patient was unable to remain still and requested that the examination be terminated. There is significant motion artifact on multiple imaging sequences.  There is mild central intrahepatic ductal dilatation and mild periportal edema. Several too small to characterize hepatic T2 hyperintense lesions are most likely cysts. There is trace porta hepatis stranding/fluid but  no definable collection is identified. Again noted is suggestion of irregular soft tissue at the porta hepatis just  inferior to the portal vein but this cannot be measured with accuracy due to motion artifact on the current exam.  The common duct is normal in caliber and no filling defect is identified.  The pancreas is unremarkable without pancreatic ductal dilatation.  Adrenal glands are normal. Probable subcentimeter left renal cortical cyst. No hydronephrosis.  No measurable lymphadenopathy or aortic aneurysm. Artifact from sternal wires partly visualized.  IMPRESSION: Minimal periportal edema and central mild intrahepatic ductal dilatation. No hepatic mass is identified. Evaluation is limited by patient motion and lack of ability to administer contrast. There is a persisting question of soft tissue mass within the porta hepatis inferior to the portal vein which could be a lymph node or mass producing minimal central obstruction. Other etiologies could include stricture, occult stone, or bile duct mass not visualized. Groove pancreatitis is felt less likely because the patient reportedly has normal pancreatic enzymes at recent lab values.  No gross evidence for pancreatic mass.  Findings discussed in exam reviewed with Dr. Juanda Chance by Dr. Chilton Si on 11/18/2014 at 12:40 p.m.   Electronically Signed   By: Christiana Pellant M.D.   On: 11/18/2014 12:46   US Abdomen Complete  10/28/2014   CLINICAL DATA:  Nausea, hypertension, hyperlipidemia, COPD, smoking, coronary artery disease post CABG, atrial fibrillation, CHF  EXAM: ULTRASOUND ABDOMEN COMPLETE  COMPARISON:  CT abdomen and pelvis 05/04/2014  FINDINGS: Gallbladder: Surgically absent  Common bile duct: Diameter: 5 mm diameter common normal  Liver: Mildly heterogeneous echogenicity without focal mass. Hepatopetal portal venous flow.  IVC: Normal appearance  Pancreas: Normal appearance  Spleen: Normal appearance, 11.4 cm length  Right Kidney: Length: 11.9 cm. Cortical thinning. Normal cortical echogenicity. No mass hydronephrosis.  Left Kidney: Length: 9.9 cm. Normal cortical  echogenicity. Cortical thinning. No mass or hydronephrosis.  Abdominal aorta: Atherosclerotic changes without aneurysm.  Other findings: No free fluid  IMPRESSION: Post cholecystectomy.  Heterogeneous hepatic echogenicity without discrete mass.  BILATERAL renal cortical atrophy.  Otherwise negative exam.   Electronically Signed   By: Ulyses Southward M.D.   On: 10/28/2014 15:11   Ct Abdomen Pelvis W Contrast  11/18/2014   CLINICAL DATA:  81 year old female with fever and abdominal pain.  EXAM: CT ABDOMEN AND PELVIS WITH CONTRAST  TECHNIQUE: Multidetector CT imaging of the abdomen and pelvis was performed using the standard protocol following bolus administration of intravenous contrast.  CONTRAST:  80mL OMNIPAQUE IOHEXOL 300 MG/ML  SOLN  COMPARISON:  CT dated 05/04/2014 and ultrasound dated 10/28/2014  FINDINGS: Evaluation is limited due to streak artifact caused by patient's arms and metallic left hip arthroplasty.  Emphysematous changes of the lung bases. There is coronary vascular calcification. No intra-abdominal free air or free fluid.  There is mild hepatic surface irregularity. A 1.6 x 1.0 cm hypodense lesion in the inferior right lobe of the liver (series 201 image 37) is not well characterized. MRI may provide better evaluation. Cholecystectomy. The pancreas is atrophic. There is an ill-defined soft tissue prominence involving the head of the pancreas at the ampulla of Vater. This area is not well evaluated on the CT. The the capillary mass or pancreatic lesion is not excluded. Endoscopic ultrasound is recommended for better evaluation. The spleen, adrenal glands appear unremarkable. There is moderate bilateral renal atrophy. There is no hydronephrosis. The visualized ureters and urinary bladder appear unremarkable. Hysterectomy.  Extensive sigmoid diverticulosis with muscular  hypertrophy. No definite active inflammatory changes. Moderate stool in the distal colon. No evidence of bowel obstruction. There is a  1.6 cm duodenal diverticulum. There is soft tissue density along the medial wall of the second portion of the duodenum (series 201, image 35). Normal appendix.  There is aortoiliac atherosclerotic disease. A 3.2 cm infrarenal abdominal aortic aneurysm. The origins of the celiac axis, SMA, IMA as well as the origins of the renal arteries appear patent. No portal venous gas identified. Ill-defined nodular soft tissue density in the region of the porta hepatis likely represent lymph nodes. There is diastases of anterior abdominal wall musculature in the midline with a small fat containing umbilical hernia. Degenerative changes of the spine. Left hip arthroplasty. No acute fracture.  IMPRESSION: Ill-defined soft tissue thickness involving the head and uncinate process of the pancreas as well as the medial wall of the second portion of the duodenum. MRI or endoscopic ultrasound is recommended for further evaluation.  Sigmoid diverticulosis. No evidence of bowel obstruction or inflammation. Normal appendix.  Incompletely characterized hypodense lesion along the inferior surface of the right lobe of the liver. MRI may provide better characterization.   Electronically Signed   By: Elgie Collard M.D.   On: 11/18/2014 01:03   Dg Chest Port 1 View  11/19/2014   CLINICAL DATA:  Shortness of breath, history of COPD on home oxygen, history of CHF and CABG, previous tobacco use.  EXAM: PORTABLE CHEST - 1 VIEW  COMPARISON:  Portable chest x-ray of November 17, 2014  FINDINGS: The lungs remain hyperinflated. There is no focal infiltrate. The cardiac silhouette remains enlarged. The pulmonary vascularity is prominent centrally. There is no pulmonary interstitial edema. There are post CABG changes. There is no pleural effusion. The bony thorax exhibits no acute abnormality.  IMPRESSION: COPD with low-grade compensated CHF. There is no pulmonary edema or pneumonia.   Electronically Signed   By: David  [place] M.D.   On: 11/19/2014  07:59   Dg Chest Portable 1 View  11/17/2014   CLINICAL DATA:  81 year old female with shortness of breath and tachycardia.  EXAM: PORTABLE CHEST - 1 VIEW  COMPARISON:  Radiograph dated 10/28/2014  FINDINGS: Single-view of the chest demonstrate emphysematous changes of the lungs. There is no focal consolidation, pleural effusion, or pneumothorax. The cardiomediastinal silhouette is within normal limits. Degenerative changes of the osseous structures.  IMPRESSION: Emphysema.  No focal consolidation or pneumothorax.   Electronically Signed   By: Elgie Collard M.D.   On: 11/17/2014 22:50     CBC  Recent Labs Lab 11/17/14 2222 11/18/14 0321 11/19/14 0235  WBC 17.9* 18.2* 9.4  HGB 11.9* 9.9* 9.5*  HCT 36.2 30.3* 29.9*  PLT 192 155 136*  MCV 87.7 87.8 89.0  MCH 28.8 28.7 28.3  MCHC 32.9 32.7 31.8  RDW 13.8 14.0 14.1  LYMPHSABS 0.2* 0.2*  --   MONOABS 0.8 1.1*  --   EOSABS 0.0 0.0  --   BASOSABS 0.0 0.0  --     Chemistries   Recent Labs Lab 11/17/14 2222 11/18/14 0321 11/19/14 0235  NA 133* 131* 133*  K 3.4* 3.4* 3.9  CL 87* 89* 95*  CO2 33* 32 28  GLUCOSE 189* 177* 117*  BUN 12 13 18   CREATININE 1.22* 1.26* 1.26*  CALCIUM 10.3 9.2 9.2  MG  --   --  1.6*  AST 353* 223* 87*  ALT 415* 300* 198*  ALKPHOS 301* 230* 199*  BILITOT 5.0* 4.1* 2.8*   ------------------------------------------------------------------------------------------------------------------  estimated creatinine clearance is 39 mL/min (by C-G formula based on Cr of 1.26). ------------------------------------------------------------------------------------------------------------------ No results for input(s): HGBA1C in the last 72 hours. ------------------------------------------------------------------------------------------------------------------ No results for input(s): CHOL, HDL, LDLCALC, TRIG, CHOLHDL, LDLDIRECT in the last 72  hours. ------------------------------------------------------------------------------------------------------------------  Recent Labs  11/18/14 0845  TSH 0.702   ------------------------------------------------------------------------------------------------------------------ No results for input(s): VITAMINB12, FOLATE, FERRITIN, TIBC, IRON, RETICCTPCT in the last 72 hours.  Coagulation profile  Recent Labs Lab 11/17/14 2222 11/18/14 0321  INR 1.13 1.32    No results for input(s): DDIMER in the last 72 hours.  Cardiac Enzymes  Recent Labs Lab 11/18/14 0321 11/18/14 0845 11/18/14 1536  TROPONINI 0.12* 0.04* 0.04*   ------------------------------------------------------------------------------------------------------------------ Invalid input(s): POCBNP   Time Spent in minutes  35   SINGH,PRASHANT K M.D on 11/19/2014 at 10:59 AM  Between 7am to 7pm - Pager - [phone]  After 7pm go to [email or web address] - password [facility]  [facility] -  Office  [phone]

## 2014-11-19 NOTE — Progress Notes (Signed)
Daily Rounding Note  11/19/2014, 8:29 AM  LOS: 1 day   SUBJECTIVE:       Dyspnea improved.  "spitting up", not vomiting, no nausea.  Remains anorexic.  No BM.  No pain anywhere.   OBJECTIVE:         Vital signs in last 24 hours:    Temp:  [98.1 F (36.7 C)-98.8 F (37.1 C)] 98.1 F (36.7 C) (07/25 0356) Pulse Rate:  [70-148] 148 (07/25 0402) Resp:  [16] 16 (07/24 2006) BP: (104-138)/(51-97) 116/58 mmHg (07/25 0402) SpO2:  [84 %-99 %] 97 % (07/25 0402) Weight:  [190 lb 11.2 oz (86.5 kg)] 190 lb 11.2 oz (86.5 kg) (07/25 0500)   Filed Weights   11/17/14 2203 11/18/14 0335 11/19/14 0500  Weight: 184 lb (83.462 kg) 185 lb 6.5 oz (84.1 kg) 190 lb 11.2 oz (86.5 kg)   General: looks ill, comfortable   Heart: RRR Chest: greatly reduced globally but no adventitious sounds.  No cough, some mild dyspnea  Abdomen: soft, ND, slight discomfort in right mid to lower abdomen (not present at rest).  BS active.    Extremities: no CCE Neuro/Psych:  Pleasant, affect subdued.  Oriented to place, year and self.  No gross limb weakness of deficits.   Intake/Output from previous day: 07/24 0701 - 07/25 0700 In: 2110 [P.O.:120; I.V.:1540; IV Piggyback:450] Out: 1100 [Urine:1100]  Intake/Output this shift:    Lab Results:  Recent Labs  11/17/14 2222 11/18/14 0321 11/19/14 0235  WBC 17.9* 18.2* 9.4  HGB 11.9* 9.9* 9.5*  HCT 36.2 30.3* 29.9*  PLT 192 155 136*   BMET  Recent Labs  11/17/14 2222 11/18/14 0321 11/19/14 0235  NA 133* 131* 133*  K 3.4* 3.4* 3.9  CL 87* 89* 95*  CO2 33* 32 28  GLUCOSE 189* 177* 117*  BUN 12 13 18   CREATININE 1.22* 1.26* 1.26*  CALCIUM 10.3 9.2 9.2   LFT  Recent Labs  11/17/14 2222 11/18/14 0321 11/19/14 0235  PROT 7.0 5.7* 5.5*  ALBUMIN 3.6 2.9* 2.8*  AST 353* 223* 87*  ALT 415* 300* 198*  ALKPHOS 301* 230* 199*  BILITOT 5.0* 4.1* 2.8*   PT/INR  Recent Labs  11/17/14 2222  11/18/14 0321  LABPROT 14.7 16.5*  INR 1.13 1.32   Hepatitis Panel No results for input(s): HEPBSAG, HCVAB, HEPAIGM, HEPBIGM in the last 72 hours.  Studies/Results: Mr Abdomen Wo Contrast  11/18/2014   CLINICAL DATA:  Elevated liver function tests. Remote history of breast cancer. Question of duodenal or pancreatic mass on prior exam.  EXAM: MRI ABDOMEN WITHOUT CONTRAST  TECHNIQUE: Multiplanar multisequence MR imaging was performed without the administration of intravenous contrast.  COMPARISON:  CT abdomen/pelvis 11/18/2014 and 05/04/2014, chest CT with incomplete upper abdominal imaging 09/25/2010  FINDINGS: Contrast was not administered because the patient was unable to remain still and requested that the examination be terminated. There is significant motion artifact on multiple imaging sequences.  There is mild central intrahepatic ductal dilatation and mild periportal edema. Several too small to characterize hepatic T2 hyperintense lesions are most likely cysts. There is trace porta hepatis stranding/fluid but no definable collection is identified. Again noted is suggestion of irregular soft tissue at the porta hepatis just inferior to the portal vein but this cannot be measured with accuracy due to motion artifact on the current exam.  The common duct is normal in caliber and no filling defect is identified.  The pancreas  is unremarkable without pancreatic ductal dilatation.  Adrenal glands are normal. Probable subcentimeter left renal cortical cyst. No hydronephrosis.  No measurable lymphadenopathy or aortic aneurysm. Artifact from sternal wires partly visualized.  IMPRESSION: Minimal periportal edema and central mild intrahepatic ductal dilatation. No hepatic mass is identified. Evaluation is limited by patient motion and lack of ability to administer contrast. There is a persisting question of soft tissue mass within the porta hepatis inferior to the portal vein which could be a lymph node or mass  producing minimal central obstruction. Other etiologies could include stricture, occult stone, or bile duct mass not visualized. Groove pancreatitis is felt less likely because the patient reportedly has normal pancreatic enzymes at recent lab values.  No gross evidence for pancreatic mass.  Findings discussed in exam reviewed with Dr. Juanda Chance by Dr. Chilton Si on 11/18/2014 at 12:40 p.m.   Electronically Signed   By: Christiana Pellant M.D.   On: 11/18/2014 12:46   Ct Abdomen Pelvis W Contrast  11/18/2014   CLINICAL DATA:  81 year old female with fever and abdominal pain.  EXAM: CT ABDOMEN AND PELVIS WITH CONTRAST  TECHNIQUE: Multidetector CT imaging of the abdomen and pelvis was performed using the standard protocol following bolus administration of intravenous contrast.  CONTRAST:  80mL OMNIPAQUE IOHEXOL 300 MG/ML  SOLN  COMPARISON:  CT dated 05/04/2014 and ultrasound dated 10/28/2014  FINDINGS: Evaluation is limited due to streak artifact caused by patient's arms and metallic left hip arthroplasty.  Emphysematous changes of the lung bases. There is coronary vascular calcification. No intra-abdominal free air or free fluid.  There is mild hepatic surface irregularity. A 1.6 x 1.0 cm hypodense lesion in the inferior right lobe of the liver (series 201 image 37) is not well characterized. MRI may provide better evaluation. Cholecystectomy. The pancreas is atrophic. There is an ill-defined soft tissue prominence involving the head of the pancreas at the ampulla of Vater. This area is not well evaluated on the CT. The the capillary mass or pancreatic lesion is not excluded. Endoscopic ultrasound is recommended for better evaluation. The spleen, adrenal glands appear unremarkable. There is moderate bilateral renal atrophy. There is no hydronephrosis. The visualized ureters and urinary bladder appear unremarkable. Hysterectomy.  Extensive sigmoid diverticulosis with muscular hypertrophy. No definite active inflammatory  changes. Moderate stool in the distal colon. No evidence of bowel obstruction. There is a 1.6 cm duodenal diverticulum. There is soft tissue density along the medial wall of the second portion of the duodenum (series 201, image 35). Normal appendix.  There is aortoiliac atherosclerotic disease. A 3.2 cm infrarenal abdominal aortic aneurysm. The origins of the celiac axis, SMA, IMA as well as the origins of the renal arteries appear patent. No portal venous gas identified. Ill-defined nodular soft tissue density in the region of the porta hepatis likely represent lymph nodes. There is diastases of anterior abdominal wall musculature in the midline with a small fat containing umbilical hernia. Degenerative changes of the spine. Left hip arthroplasty. No acute fracture.  IMPRESSION: Ill-defined soft tissue thickness involving the head and uncinate process of the pancreas as well as the medial wall of the second portion of the duodenum. MRI or endoscopic ultrasound is recommended for further evaluation.  Sigmoid diverticulosis. No evidence of bowel obstruction or inflammation. Normal appendix.  Incompletely characterized hypodense lesion along the inferior surface of the right lobe of the liver. MRI may provide better characterization.   Electronically Signed   By: Elgie Collard M.D.   On: 11/18/2014  01:03   Dg Chest Port 1 View  11/19/2014   CLINICAL DATA:  Shortness of breath, history of COPD on home oxygen, history of CHF and CABG, previous tobacco use.  EXAM: PORTABLE CHEST - 1 VIEW  COMPARISON:  Portable chest x-ray of November 17, 2014  FINDINGS: The lungs remain hyperinflated. There is no focal infiltrate. The cardiac silhouette remains enlarged. The pulmonary vascularity is prominent centrally. There is no pulmonary interstitial edema. There are post CABG changes. There is no pleural effusion. The bony thorax exhibits no acute abnormality.  IMPRESSION: COPD with low-grade compensated CHF. There is no pulmonary  edema or pneumonia.   Electronically Signed   By: David  [place] M.D.   On: 11/19/2014 07:59   Dg Chest Portable 1 View  11/17/2014   CLINICAL DATA:  81 year old female with shortness of breath and tachycardia.  EXAM: PORTABLE CHEST - 1 VIEW  COMPARISON:  Radiograph dated 10/28/2014  FINDINGS: Single-view of the chest demonstrate emphysematous changes of the lungs. There is no focal consolidation, pleural effusion, or pneumothorax. The cardiomediastinal silhouette is within normal limits. Degenerative changes of the osseous structures.  IMPRESSION: Emphysema.  No focal consolidation or pneumothorax.   Electronically Signed   By: Elgie Collard M.D.   On: 11/17/2014 22:50   Scheduled Meds: . antiseptic oral rinse  7 mL Mouth Rinse BID  . aspirin EC  81 mg Oral Daily  . atorvastatin  20 mg Oral q1800  . budesonide-formoterol  2 puff Inhalation BID  . cefTAZidime (FORTAZ)  IV  1 g Intravenous Q12H  . diltiazem  300 mg Oral Daily  . heparin  5,000 Units Subcutaneous 3 times per day  . insulin aspart  0-15 Units Subcutaneous TID WC  . insulin aspart  0-5 Units Subcutaneous QHS  . methylPREDNISolone (SOLU-MEDROL) injection  60 mg Intravenous 3 times per day  . metoprolol succinate  25 mg Oral Daily  . metronidazole  500 mg Intravenous Q8H  . pantoprazole  40 mg Oral Daily  . sodium chloride  3 mL Intravenous Q12H  . tiotropium  1 capsule Inhalation Daily   Continuous Infusions: . 0.9 % NaCl with KCl 40 mEq / L 35 mL/hr (11/19/14 0811)   PRN Meds:.acetaminophen **OR** acetaminophen, ALPRAZolam, diltiazem, ondansetron **OR** ondansetron (ZOFRAN) IV  ASSESMENT:   *  Elevated LFTs, improving.  Lipase 28.  ~ 2 months anorexia, increase of chronic nausea, no abd pain.   Ultrasound 7/3: heterogenous liver, no mass, 5 mm CBD.  CT with pancreatic vs duodenal mass, right liver lesion.  MRI without MRCP: central, mild, intrahepatic duct dilatation.  Mild periportal edema. TSTC hyperintense, hepatic  lesions: likely cysts.  Trace porta hepatis stranding.  Irregluar soft tissue at porta hepatis poorly measured due to motion artifact.  Normal diameter CBD. Pancreas unremarkable.      *  Oxygen dependent COPD.    *  Afib, rate lower 100s. Troponins mimimally elevated to 0.12 but decllined to 0.04 x 2.   *  Dementia with acute AMS. MS improved.    *  Normocytic anemia.  Diverticular vs AVM induced bleeding in setting antiplatelet Rx 2008 Thrombocytopenia, new, non-critical.   *  Hyponatremia.  Non-critical.   *  GNR bacteremia on 2/2 blood cultures.  Day 3 IV flagyl and fortaz.     PLAN   *  ? Endoscopic ultrasound?     Jennye Moccasin  11/19/2014, 8:29 AM Pager: [phone]    Attending physician's note   I  have taken an interval history, reviewed the chart and examined the patient. I agree with the Advanced Practitioner's note, impression and recommendations. Imaging reviewed. Mild intrahepatic biliary dilation, possible porta hepatis soft tissue mass, possible pancreatic or duodenal mass. LFTs improving but remain elevated. GNR bacteremia. I will review with Dr. Jacobs re: EUS and/or ERCP.   VeniChristella HartiganLick. Russella Dar, MD [facility]

## 2014-11-19 NOTE — Clinical Social Work Placement (Signed)
   CLINICAL SOCIAL WORK PLACEMENT  NOTE  Date:  11/19/2014  Patient Details  Name: [name] MRN: [id number] Date of Birth: 06/14/1929  Clinical Social Work is seeking post-discharge placement for this patient at the Skilled  Nursing Facility level of care (*CSW will initial, date and re-position this form in  chart as items are completed):  Yes   Patient/family provided with [facility] Clinical Social Work Department's list of facilities offering this level of care within the geographic area requested by the patient (or if unable, by the patient's family).  Yes   Patient/family informed of their freedom to choose among providers that offer the needed level of care, that participate in Medicare, Medicaid or managed care program needed by the patient, have an available bed and are willing to accept the patient.  Yes   Patient/family informed of [facility]'s ownership interest in [facility] and [facility], as well as of the fact that they are under no obligation to receive care at these facilities.  PASRR submitted to EDS on       PASRR number received on       Existing PASRR number confirmed on 11/19/14     FL2 transmitted to all facilities in geographic area requested by pt/family on       FL2 transmitted to all facilities within larger geographic area on 11/19/14     Patient informed that his/her managed care company has contracts with or will negotiate with certain facilities, including the following:            Patient/family informed of bed offers received.  Patient chooses bed at       Physician recommends and patient chooses bed at      Patient to be transferred to   on  .  Patient to be transferred to facility by       Patient family notified on   of transfer.  Name of family member notified:        PHYSICIAN       Additional Comment:    _______________________________________________ Gwynne Edinger, LCSW 11/19/2014, 3:28 PM

## 2014-11-19 NOTE — Progress Notes (Signed)
*  PRELIMINARY RESULTS* Echocardiogram 2D Echocardiogram has been performed.  [name] 11/19/2014, 1:24 PM

## 2014-11-19 NOTE — Progress Notes (Signed)
Pt HR elevated 120 to 130's afib sustained. BP stable and patient resting. NP Schorr notified, new orders received. Will continue to monitor.

## 2014-11-20 ENCOUNTER — Ambulatory Visit: Payer: Medicare Other | Admitting: Pulmonary Disease

## 2014-11-20 ENCOUNTER — Encounter (HOSPITAL_COMMUNITY): Payer: Self-pay | Admitting: Certified Registered"

## 2014-11-20 ENCOUNTER — Inpatient Hospital Stay (HOSPITAL_COMMUNITY): Payer: Medicare Other

## 2014-11-20 ENCOUNTER — Inpatient Hospital Stay (HOSPITAL_COMMUNITY): Payer: Medicare Other | Admitting: Certified Registered"

## 2014-11-20 ENCOUNTER — Encounter (HOSPITAL_COMMUNITY)
Admission: EM | Disposition: A | Discharge status: Skilled Nursing Facility | Payer: Self-pay | Source: Home / Self Care | Attending: Internal Medicine

## 2014-11-20 DIAGNOSIS — R945 Abnormal results of liver function studies: Secondary | ICD-10-CM

## 2014-11-20 DIAGNOSIS — R7989 Other specified abnormal findings of blood chemistry: Secondary | ICD-10-CM | POA: Insufficient documentation

## 2014-11-20 DIAGNOSIS — K831 Obstruction of bile duct: Secondary | ICD-10-CM | POA: Insufficient documentation

## 2014-11-20 HISTORY — PX: ERCP: SHX5425

## 2014-11-20 LAB — COMPREHENSIVE METABOLIC PANEL
ALBUMIN: 2.7 g/dL — AB (ref 3.5–5.0)
ALK PHOS: 178 U/L — AB (ref 38–126)
ALT: 137 U/L — AB (ref 14–54)
AST: 34 U/L (ref 15–41)
Anion gap: 7 (ref 5–15)
BUN: 18 mg/dL (ref 6–20)
CHLORIDE: 97 mmol/L — AB (ref 101–111)
CO2: 27 mmol/L (ref 22–32)
Calcium: 9 mg/dL (ref 8.9–10.3)
Creatinine, Ser: 1.04 mg/dL — ABNORMAL HIGH (ref 0.44–1.00)
GFR calc Af Amer: 56 mL/min — ABNORMAL LOW (ref >60)
GFR calc non Af Amer: 48 mL/min — ABNORMAL LOW (ref >60)
Glucose, Bld: 163 mg/dL — ABNORMAL HIGH (ref 65–99)
POTASSIUM: 4.4 mmol/L (ref 3.5–5.1)
SODIUM: 131 mmol/L — AB (ref 135–145)
Total Bilirubin: 1.6 mg/dL — ABNORMAL HIGH (ref 0.3–1.2)
Total Protein: 5.5 g/dL — ABNORMAL LOW (ref 6.5–8.1)

## 2014-11-20 LAB — CBC
HEMATOCRIT: 27.3 % — AB (ref 36.0–46.0)
Hemoglobin: 9 g/dL — ABNORMAL LOW (ref 12.0–15.0)
MCH: 28.9 pg (ref 26.0–34.0)
MCHC: 33 g/dL (ref 30.0–36.0)
MCV: 87.8 fL (ref 78.0–100.0)
Platelets: 138 10*3/uL — ABNORMAL LOW (ref 150–400)
RBC: 3.11 MIL/uL — AB (ref 3.87–5.11)
RDW: 14 % (ref 11.5–15.5)
WBC: 6.4 10*3/uL (ref 4.0–10.5)

## 2014-11-20 LAB — URINE CULTURE: Culture: >=100000

## 2014-11-20 LAB — GLUCOSE, CAPILLARY
Glucose-Capillary: 223 mg/dL — ABNORMAL HIGH (ref 65–99)
Glucose-Capillary: 222 mg/dL — ABNORMAL HIGH (ref 65–99)
Glucose-Capillary: 189 mg/dL — ABNORMAL HIGH (ref 65–99)

## 2014-11-20 LAB — MAGNESIUM: Magnesium: 2.4 mg/dL (ref 1.7–2.4)

## 2014-11-20 SURGERY — ERCP, WITH INTERVENTION IF INDICATED
Anesthesia: General

## 2014-11-20 MED ORDER — ONDANSETRON HCL 4 MG/2ML IJ SOLN
INTRAMUSCULAR | Status: DC | PRN
  Administered 2014-11-20: 4 mg via INTRAVENOUS

## 2014-11-20 MED ORDER — SODIUM CHLORIDE 0.9 % IV SOLN: INTRAVENOUS | Status: DC

## 2014-11-20 MED ORDER — FENTANYL CITRATE (PF) 250 MCG/5ML IJ SOLN
INTRAMUSCULAR | Status: DC | PRN
  Administered 2014-11-20: 100 ug via INTRAVENOUS

## 2014-11-20 MED ORDER — LACTATED RINGERS IV SOLN
INTRAVENOUS | Status: DC
  Administered 2014-11-20: 08:00:00 via INTRAVENOUS

## 2014-11-20 MED ORDER — INDOMETHACIN 50 MG RE SUPP
RECTAL | Status: DC | PRN
  Administered 2014-11-20: 100 mg via RECTAL

## 2014-11-20 MED ORDER — HEPARIN SODIUM (PORCINE) 5000 UNIT/ML IJ SOLN
5000.0000 [IU] | Freq: Three times a day (TID) | INTRAMUSCULAR | Status: DC
  Administered 2014-11-20 – 2014-11-23 (×9): 5000 [IU] via SUBCUTANEOUS
  Filled 2014-11-20 (×10): qty 1

## 2014-11-20 MED ORDER — GLUCAGON HCL RDNA (DIAGNOSTIC) 1 MG IJ SOLR
INTRAMUSCULAR | Status: DC | PRN
  Administered 2014-11-20: 0.25 mg via INTRAVENOUS
  Administered 2014-11-20: .25 mg via INTRAVENOUS

## 2014-11-20 MED ORDER — FENTANYL CITRATE (PF) 100 MCG/2ML IJ SOLN: 25.0000 ug | INTRAMUSCULAR | Status: DC | PRN

## 2014-11-20 MED ORDER — PROPOFOL 10 MG/ML IV BOLUS
INTRAVENOUS | Status: DC | PRN
  Administered 2014-11-20: 30 mg via INTRAVENOUS
  Administered 2014-11-20: 100 mg via INTRAVENOUS

## 2014-11-20 MED ORDER — INDOMETHACIN 50 MG RE SUPP
100.0000 mg | Freq: Once | RECTAL | Status: DC
  Filled 2014-11-20: qty 2

## 2014-11-20 MED ORDER — LIDOCAINE HCL (CARDIAC) 20 MG/ML IV SOLN
INTRAVENOUS | Status: DC | PRN
  Administered 2014-11-20: 60 mg via INTRAVENOUS

## 2014-11-20 MED ORDER — SODIUM CHLORIDE 0.9 % IV SOLN
INTRAVENOUS | Status: DC | PRN
  Administered 2014-11-20: 30 mL

## 2014-11-20 MED ORDER — SODIUM CHLORIDE 0.9 % IV SOLN
INTRAVENOUS | Status: DC
Start: 2014-11-20 — End: 2014-11-21
  Administered 2014-11-20: 14:00:00 via INTRAVENOUS

## 2014-11-20 MED ORDER — SUCCINYLCHOLINE CHLORIDE 20 MG/ML IJ SOLN
INTRAMUSCULAR | Status: DC | PRN
  Administered 2014-11-20: 60 mg via INTRAVENOUS

## 2014-11-20 NOTE — Transfer of Care (Signed)
Immediate Anesthesia Transfer of Care Note  Patient: [name]  Procedure(s) Performed: Procedure(s): ENDOSCOPIC RETROGRADE CHOLANGIOPANCREATOGRAPHY (ERCP) (N/A)  Patient Location: Endoscopy Unit  Anesthesia Type:General  Level of Consciousness: awake, alert , oriented and patient cooperative  Airway & Oxygen Therapy: Patient Spontanous Breathing and Patient connected to nasal cannula oxygen  Post-op Assessment: Report given to RN, Post -op Vital signs reviewed and stable and Patient moving all extremities  Post vital signs: Reviewed and stable  Last Vitals:  Filed Vitals:   11/20/14 0751  BP: 158/78  Pulse: 108  Temp: 36.5 C  Resp: 22    Complications: No apparent anesthesia complications

## 2014-11-20 NOTE — Progress Notes (Signed)
Patient Demographics:    [name], is a 81 y.o. female, DOB - 03-Apr-1930, ZOX:[id number]  Admit date - 11/17/2014   Admitting Physician Rolly Salter, MD  Outpatient Primary MD for the patient is Kaleen Mask, MD  LOS - 2   Chief Complaint  Patient presents with  . Altered Mental Status  . Tachycardia        Subjective:    [name] today has, No headache, No chest pain, No abdominal pain - No Nausea, No new weakness tingling or numbness, mild dry cough with shortness of breath   Assessment  & Plan :    1.? Sepsis - unclear presentation with generalized weakness, leukocytosis, encephalopathy. Does have elevated liver enzymes, does have question of a pancreatic mass - question if she has mild cholangitis, no abdominal pain, Blood cultures 2/2 growing gram-negative rods. CT scan abdomen and pelvis and MRI abdomen both inconclusive. GI on board ERCP stable follow biopsy may need EUS, will defer to GI. Continue ABX.    2. Elevated liver enzymes with questionable pancreatic mass on CT and MRI abdomen. Could have mild cholangitis as she had leukocytosis and encephalopathy on admission, anti-biotics as above, may need ERCP. Plan as above.   3. COPD. On home oxygen, currently at baseline, no wheezing on exam, but minimal movement, still complaining of some shortness of breath will give her a trial of IV Steroid, CXR non acute, Continue supportive care with oxygen nebulizer treatments as needed.   4. Atrial fibrillation chronic in RVR upon admission. [place] Vasc of greater than 3. Now in rate control on IV Cardizem drip, Cards following, continue Toprol-XL. Not on anticoagulation due to previous GI bleed.   5. CAD with history of chronic diastolic CHF last EF of 55% in 2014. Clinically  compensated from CHF standpoint, chest pain-free, mild elevation of troponin not in ACS pattern. Continue aspirin, beta blocker and statin for secondary prevention. Stable TTE.   6.Hyponatremia. Was dehydrated as her oral intake prior to admission was poor, improved after hydration with IV fluids continue gentle IV fluids.   7. Hypokalemia. Replaced.   8. GERD. On PPI continue.   9.  Dyslipidemia. On home dose statin continue.    10. Encephalopathy due to #1 above. Much improved, continue supportive care, at risk for delirium. Minimize narcotics and benzodiazepine's.    11.UTI - On ABX.   12. DM type II. Hold Glucophage, on sliding scale monitor CBGs.  CBG (last 3)   Recent Labs  11/19/14 1201 11/19/14 1632 11/19/14 2253  GLUCAP 165* 271* 183*      Code Status : Full  Family Communication  : daughter in law over there phone, told prognosis guarded she understands, Grand daughter bedside  Disposition Plan  : To be decided  Consults  : GI, Cards  Procedures  :   CT Abd - ? Pancreatic mass/edema  MRI ABD - ? Pancreatic mass  TTE  - Left ventricle: The cavity size was normal. Wall thickness wasincreased in a pattern of mild LVH. Systolic function was normal.The estimated ejection fraction was in the range of 55% to 60%. Regional wall motion abnormalities cannot be excluded. - Mitral valve: There was mild regurgitation. - Left atrium: The atrium was  moderately dilated. - Right atrium: The atrium was mildly dilated. - Pulmonary arteries: PA peak pressure: 35 mm Hg (S).   DVT Prophylaxis  :   Heparin   Lab Results  Component Value Date   PLT 138* 11/20/2014    Inpatient Medications  Scheduled Meds: . antiseptic oral rinse  7 mL Mouth Rinse BID  . aspirin EC  81 mg Oral Daily  . atorvastatin  20 mg Oral q1800  . budesonide-formoterol  2 puff Inhalation BID  . cefTAZidime (FORTAZ)  IV  2 g Intravenous Q12H  . indomethacin  100 mg Rectal Once  .  insulin aspart  0-15 Units Subcutaneous TID WC  . insulin aspart  0-5 Units Subcutaneous QHS  . methylPREDNISolone (SOLU-MEDROL) injection  60 mg Intravenous 3 times per day  . metoprolol succinate  25 mg Oral Daily  . metronidazole  500 mg Intravenous Q8H  . pantoprazole  40 mg Oral Daily  . sodium chloride  3 mL Intravenous Q12H  . tiotropium  1 capsule Inhalation Daily   Continuous Infusions: . sodium chloride 50 mL/hr at 11/19/14 1113  . sodium chloride Stopped (11/20/14 0300)  . sodium chloride Stopped (11/20/14 0546)  . diltiazem (CARDIZEM) infusion 5 mg/hr (11/20/14 0846)  . lactated ringers 50 mL/hr at 11/20/14 0755   PRN Meds:.acetaminophen **OR** acetaminophen, ALPRAZolam, diltiazem, ondansetron **OR** ondansetron (ZOFRAN) IV  Antibiotics  :    Anti-infectives    Start     Dose/Rate Route Frequency Ordered Stop   11/19/14 2200  cefTAZidime (FORTAZ) 2 g in dextrose 5 % 50 mL IVPB     2 g 100 mL/hr over 30 Minutes Intravenous Every 12 hours 11/19/14 1307     11/18/14 1800  vancomycin (VANCOCIN) IVPB 1000 mg/200 mL premix  Status:  Discontinued     1,000 mg 200 mL/hr over 60 Minutes Intravenous Every 24 hours 11/18/14 0313 11/19/14 0707   11/18/14 1400  metroNIDAZOLE (FLAGYL) IVPB 500 mg     500 mg 100 mL/hr over 60 Minutes Intravenous Every 8 hours 11/18/14 1320     11/18/14 0330  cefTAZidime (FORTAZ) 1 g in dextrose 5 % 50 mL IVPB  Status:  Discontinued     1 g 100 mL/hr over 30 Minutes Intravenous Every 12 hours 11/18/14 0313 11/19/14 1307   11/18/14 0300  levofloxacin (LEVAQUIN) IVPB 750 mg     750 mg 100 mL/hr over 90 Minutes Intravenous  Once 11/18/14 0246 11/18/14 0452   11/18/14 0300  vancomycin (VANCOCIN) IVPB 1000 mg/200 mL premix  Status:  Discontinued     1,000 mg 200 mL/hr over 60 Minutes Intravenous  Once 11/18/14 0246 11/18/14 0247   11/17/14 2345  imipenem-cilastatin (PRIMAXIN) 500 mg in sodium chloride 0.9 % 100 mL IVPB  Status:  Discontinued     500  mg 200 mL/hr over 30 Minutes Intravenous  Once 11/17/14 2338 11/18/14 0246   11/17/14 2315  piperacillin-tazobactam (ZOSYN) IVPB 3.375 g  Status:  Discontinued     3.375 g 100 mL/hr over 30 Minutes Intravenous  Once 11/17/14 2301 11/17/14 2302   11/17/14 2315  piperacillin-tazobactam (ZOSYN) IVPB 3.375 g  Status:  Discontinued     3.375 g 100 mL/hr over 30 Minutes Intravenous  Once 11/17/14 2302 11/17/14 2308   11/17/14 2315  vancomycin (VANCOCIN) 1,250 mg in sodium chloride 0.9 % 250 mL IVPB     1,250 mg 166.7 mL/hr over 90 Minutes Intravenous  Once 11/17/14 2305 11/18/14 0321  Objective:   Filed Vitals:   11/20/14 1000 11/20/14 1005 11/20/14 1010 11/20/14 1043  BP: 166/83 150/93 167/83 154/88  Pulse: 125 102  84  Temp:    97.8 F (36.6 C)  TempSrc:    Oral  Resp: 20 19    Height:      Weight:      SpO2: 98% 95%  97%    Wt Readings from Last 3 Encounters:  11/20/14 87.7 kg (193 lb 5.5 oz)  07/16/14 83.915 kg (185 lb)  05/08/14 86.8 kg (191 lb 5.8 oz)     Intake/Output Summary (Last 24 hours) at 11/20/14 1059 Last data filed at 11/20/14 1056  Gross per 24 hour  Intake 1094.67 ml  Output   1000 ml  Net  94.67 ml     Physical Exam  Awake Alert, Oriented X 3, No new F.N deficits, Normal affect [place].AT,PERRAL Supple Neck,No JVD, No cervical lymphadenopathy appriciated.  Symmetrical Chest wall movement, moderate air movement bilaterally, CTAB RRR,No Gallops,Rubs or new Murmurs, No Parasternal Heave +ve B.Sounds, Abd Soft, No tenderness, No organomegaly appriciated, No rebound - guarding or rigidity. No Cyanosis, Clubbing or edema, No new Rash or bruise       Data Review:   Micro Results Recent Results (from the past 240 hour(s))  Urine culture     Status: None   Collection Time: 11/17/14 11:20 PM  Result Value Ref Range Status   Specimen Description URINE, CATHETERIZED  Final   Special Requests NONE  Final   Culture >=100,000 COLONIES/mL PSEUDOMONAS  AERUGINOSA  Final   Report Status 11/20/2014 FINAL  Final   Organism ID, Bacteria PSEUDOMONAS AERUGINOSA  Final      Susceptibility   Pseudomonas aeruginosa - MIC*    CEFTAZIDIME 8 SENSITIVE Sensitive     CIPROFLOXACIN <=0.25 SENSITIVE Sensitive     GENTAMICIN 2 SENSITIVE Sensitive     IMIPENEM 2 SENSITIVE Sensitive     PIP/TAZO 16 SENSITIVE Sensitive     CEFEPIME 4 SENSITIVE Sensitive     * >=100,000 COLONIES/mL PSEUDOMONAS AERUGINOSA  Blood culture (routine x 2)     Status: None (Preliminary result)   Collection Time: 11/18/14 12:37 AM  Result Value Ref Range Status   Specimen Description BLOOD RIGHT WRIST  Final   Special Requests BOTTLES DRAWN AEROBIC AND ANAEROBIC 5CC  Final   Culture  Setup Time   Final    GRAM NEGATIVE RODS AEROBIC BOTTLE ONLY CRITICAL RESULT CALLED TO, READ BACK BY AND VERIFIED WITH: L HITT,RN 07.24.16 2015 M SHIPMAN CONFIRMED BY M CAMPBELL    Culture PSEUDOMONAS AERUGINOSA  Final   Report Status PENDING  Incomplete  Blood culture (routine x 2)     Status: None (Preliminary result)   Collection Time: 11/18/14 12:38 AM  Result Value Ref Range Status   Specimen Description BLOOD LEFT ARM  Final   Special Requests BOTTLES DRAWN AEROBIC AND ANAEROBIC 5CC  Final   Culture  Setup Time   Final    GRAM NEGATIVE RODS AEROBIC BOTTLE ONLY CRITICAL RESULT CALLED TO, READ BACK BY AND VERIFIED WITH: HITT,L RN 11/18/14 2015 WOOTEN,K CONFIRMED BY K BARR    Culture PSEUDOMONAS AERUGINOSA  Final   Report Status PENDING  Incomplete  MRSA PCR Screening     Status: None   Collection Time: 11/18/14  4:29 AM  Result Value Ref Range Status   MRSA by PCR NEGATIVE NEGATIVE Final    Comment:        The  GeneXpert MRSA Assay (FDA approved for NASAL specimens only), is one component of a comprehensive MRSA colonization surveillance program. It is not intended to diagnose MRSA infection nor to guide or monitor treatment for MRSA infections.   Urine culture     Status:  None (Preliminary result)   Collection Time: 11/18/14  3:10 PM  Result Value Ref Range Status   Specimen Description URINE, RANDOM  Final   Special Requests NONE  Final   Culture CULTURE REINCUBATED FOR BETTER GROWTH  Final   Report Status PENDING  Incomplete    Radiology Reports Dg Chest 2 View  10/28/2014   CLINICAL DATA:  Shortness of breath  EXAM: CHEST  2 VIEW  COMPARISON:  07/16/2014  FINDINGS: There is hyperinflation of the lungs compatible with COPD. Prior CABG. Cardiomegaly. Lungs are clear. No effusions. No acute bony abnormality.  IMPRESSION: COPD, cardiomegaly.  No active disease.   Electronically Signed   By: Charlett Nose M.D.   On: 10/28/2014 10:23   Mr Abdomen Wo Contrast  11/18/2014   CLINICAL DATA:  Elevated liver function tests. Remote history of breast cancer. Question of duodenal or pancreatic mass on prior exam.  EXAM: MRI ABDOMEN WITHOUT CONTRAST  TECHNIQUE: Multiplanar multisequence MR imaging was performed without the administration of intravenous contrast.  COMPARISON:  CT abdomen/pelvis 11/18/2014 and 05/04/2014, chest CT with incomplete upper abdominal imaging 09/25/2010  FINDINGS: Contrast was not administered because the patient was unable to remain still and requested that the examination be terminated. There is significant motion artifact on multiple imaging sequences.  There is mild central intrahepatic ductal dilatation and mild periportal edema. Several too small to characterize hepatic T2 hyperintense lesions are most likely cysts. There is trace porta hepatis stranding/fluid but no definable collection is identified. Again noted is suggestion of irregular soft tissue at the porta hepatis just inferior to the portal vein but this cannot be measured with accuracy due to motion artifact on the current exam.  The common duct is normal in caliber and no filling defect is identified.  The pancreas is unremarkable without pancreatic ductal dilatation.  Adrenal glands are  normal. Probable subcentimeter left renal cortical cyst. No hydronephrosis.  No measurable lymphadenopathy or aortic aneurysm. Artifact from sternal wires partly visualized.  IMPRESSION: Minimal periportal edema and central mild intrahepatic ductal dilatation. No hepatic mass is identified. Evaluation is limited by patient motion and lack of ability to administer contrast. There is a persisting question of soft tissue mass within the porta hepatis inferior to the portal vein which could be a lymph node or mass producing minimal central obstruction. Other etiologies could include stricture, occult stone, or bile duct mass not visualized. Groove pancreatitis is felt less likely because the patient reportedly has normal pancreatic enzymes at recent lab values.  No gross evidence for pancreatic mass.  Findings discussed in exam reviewed with Dr. Juanda Chance by Dr. Chilton Si on 11/18/2014 at 12:40 p.m.   Electronically Signed   By: Christiana Pellant M.D.   On: 11/18/2014 12:46   US Abdomen Complete  10/28/2014   CLINICAL DATA:  Nausea, hypertension, hyperlipidemia, COPD, smoking, coronary artery disease post CABG, atrial fibrillation, CHF  EXAM: ULTRASOUND ABDOMEN COMPLETE  COMPARISON:  CT abdomen and pelvis 05/04/2014  FINDINGS: Gallbladder: Surgically absent  Common bile duct: Diameter: 5 mm diameter common normal  Liver: Mildly heterogeneous echogenicity without focal mass. Hepatopetal portal venous flow.  IVC: Normal appearance  Pancreas: Normal appearance  Spleen: Normal appearance, 11.4 cm length  Right  Kidney: Length: 11.9 cm. Cortical thinning. Normal cortical echogenicity. No mass hydronephrosis.  Left Kidney: Length: 9.9 cm. Normal cortical echogenicity. Cortical thinning. No mass or hydronephrosis.  Abdominal aorta: Atherosclerotic changes without aneurysm.  Other findings: No free fluid  IMPRESSION: Post cholecystectomy.  Heterogeneous hepatic echogenicity without discrete mass.  BILATERAL renal cortical atrophy.   Otherwise negative exam.   Electronically Signed   By: Ulyses Southward M.D.   On: 10/28/2014 15:11   Ct Abdomen Pelvis W Contrast  11/18/2014   CLINICAL DATA:  81 year old female with fever and abdominal pain.  EXAM: CT ABDOMEN AND PELVIS WITH CONTRAST  TECHNIQUE: Multidetector CT imaging of the abdomen and pelvis was performed using the standard protocol following bolus administration of intravenous contrast.  CONTRAST:  80mL OMNIPAQUE IOHEXOL 300 MG/ML  SOLN  COMPARISON:  CT dated 05/04/2014 and ultrasound dated 10/28/2014  FINDINGS: Evaluation is limited due to streak artifact caused by patient's arms and metallic left hip arthroplasty.  Emphysematous changes of the lung bases. There is coronary vascular calcification. No intra-abdominal free air or free fluid.  There is mild hepatic surface irregularity. A 1.6 x 1.0 cm hypodense lesion in the inferior right lobe of the liver (series 201 image 37) is not well characterized. MRI may provide better evaluation. Cholecystectomy. The pancreas is atrophic. There is an ill-defined soft tissue prominence involving the head of the pancreas at the ampulla of Vater. This area is not well evaluated on the CT. The the capillary mass or pancreatic lesion is not excluded. Endoscopic ultrasound is recommended for better evaluation. The spleen, adrenal glands appear unremarkable. There is moderate bilateral renal atrophy. There is no hydronephrosis. The visualized ureters and urinary bladder appear unremarkable. Hysterectomy.  Extensive sigmoid diverticulosis with muscular hypertrophy. No definite active inflammatory changes. Moderate stool in the distal colon. No evidence of bowel obstruction. There is a 1.6 cm duodenal diverticulum. There is soft tissue density along the medial wall of the second portion of the duodenum (series 201, image 35). Normal appendix.  There is aortoiliac atherosclerotic disease. A 3.2 cm infrarenal abdominal aortic aneurysm. The origins of the celiac  axis, SMA, IMA as well as the origins of the renal arteries appear patent. No portal venous gas identified. Ill-defined nodular soft tissue density in the region of the porta hepatis likely represent lymph nodes. There is diastases of anterior abdominal wall musculature in the midline with a small fat containing umbilical hernia. Degenerative changes of the spine. Left hip arthroplasty. No acute fracture.  IMPRESSION: Ill-defined soft tissue thickness involving the head and uncinate process of the pancreas as well as the medial wall of the second portion of the duodenum. MRI or endoscopic ultrasound is recommended for further evaluation.  Sigmoid diverticulosis. No evidence of bowel obstruction or inflammation. Normal appendix.  Incompletely characterized hypodense lesion along the inferior surface of the right lobe of the liver. MRI may provide better characterization.   Electronically Signed   By: Elgie Collard M.D.   On: 11/18/2014 01:03   Dg Chest Port 1 View  11/19/2014   CLINICAL DATA:  Shortness of breath, history of COPD on home oxygen, history of CHF and CABG, previous tobacco use.  EXAM: PORTABLE CHEST - 1 VIEW  COMPARISON:  Portable chest x-ray of November 17, 2014  FINDINGS: The lungs remain hyperinflated. There is no focal infiltrate. The cardiac silhouette remains enlarged. The pulmonary vascularity is prominent centrally. There is no pulmonary interstitial edema. There are post CABG changes. There is no pleural  effusion. The bony thorax exhibits no acute abnormality.  IMPRESSION: COPD with low-grade compensated CHF. There is no pulmonary edema or pneumonia.   Electronically Signed   By: David  [place] M.D.   On: 11/19/2014 07:59   Dg Chest Portable 1 View  11/17/2014   CLINICAL DATA:  81 year old female with shortness of breath and tachycardia.  EXAM: PORTABLE CHEST - 1 VIEW  COMPARISON:  Radiograph dated 10/28/2014  FINDINGS: Single-view of the chest demonstrate emphysematous changes of the  lungs. There is no focal consolidation, pleural effusion, or pneumothorax. The cardiomediastinal silhouette is within normal limits. Degenerative changes of the osseous structures.  IMPRESSION: Emphysema.  No focal consolidation or pneumothorax.   Electronically Signed   By: Elgie Collard M.D.   On: 11/17/2014 22:50   Dg Ercp Biliary & Pancreatic Ducts  11/20/2014   CLINICAL DATA:  81 year old female with choledocholithiasis  EXAM: ERCP  TECHNIQUE: Multiple spot images obtained with the fluoroscopic device and submitted for interpretation post-procedure.  FLUOROSCOPY TIME:  Please see operative report for further detail.  COMPARISON:  MRCP 11/18/2014  FINDINGS: A total of 8 intraoperative spot films demonstrate a flexible endoscope in the descending duodenum followed by wire cannulation of the common bile duct and cholangiogram. Cholangiogram demonstrates several rounded filling defects in the distal common bile duct consistent with choledocholithiasis. The images demonstrate balloon sweep of the common bile duct. On the final image, no further filling defects are identified.  IMPRESSION: Choledocholithiasis with sphincterotomy and balloon sweep of the common bile duct.  These images were submitted for radiologic interpretation only. Please see the procedural report for the amount of contrast and the fluoroscopy time utilized.   Electronically Signed   By: Malachy Moan M.D.   On: 11/20/2014 09:42     CBC  Recent Labs Lab 11/17/14 2222 11/18/14 0321 11/19/14 0235 11/20/14 0259  WBC 17.9* 18.2* 9.4 6.4  HGB 11.9* 9.9* 9.5* 9.0*  HCT 36.2 30.3* 29.9* 27.3*  PLT 192 155 136* 138*  MCV 87.7 87.8 89.0 87.8  MCH 28.8 28.7 28.3 28.9  MCHC 32.9 32.7 31.8 33.0  RDW 13.8 14.0 14.1 14.0  LYMPHSABS 0.2* 0.2*  --   --   MONOABS 0.8 1.1*  --   --   EOSABS 0.0 0.0  --   --   BASOSABS 0.0 0.0  --   --     Chemistries   Recent Labs Lab 11/17/14 2222 11/18/14 0321 11/19/14 0235 11/20/14 0259    NA 133* 131* 133* 131*  K 3.4* 3.4* 3.9 4.4  CL 87* 89* 95* 97*  CO2 33* 32 28 27  GLUCOSE 189* 177* 117* 163*  BUN 12 13 18 18   CREATININE 1.22* 1.26* 1.26* 1.04*  CALCIUM 10.3 9.2 9.2 9.0  MG  --   --  1.6* 2.4  AST 353* 223* 87* 34  ALT 415* 300* 198* 137*  ALKPHOS 301* 230* 199* 178*  BILITOT 5.0* 4.1* 2.8* 1.6*   ------------------------------------------------------------------------------------------------------------------ estimated creatinine clearance is 47.3 mL/min (by C-G formula based on Cr of 1.04). ------------------------------------------------------------------------------------------------------------------  Recent Labs  11/18/14 0321  HGBA1C 5.5   ------------------------------------------------------------------------------------------------------------------ No results for input(s): CHOL, HDL, LDLCALC, TRIG, CHOLHDL, LDLDIRECT in the last 72 hours. ------------------------------------------------------------------------------------------------------------------  Recent Labs  11/18/14 0845  TSH 0.702   ------------------------------------------------------------------------------------------------------------------ No results for input(s): VITAMINB12, FOLATE, FERRITIN, TIBC, IRON, RETICCTPCT in the last 72 hours.  Coagulation profile  Recent Labs Lab 11/17/14 2222 11/18/14 0321  INR 1.13 1.32    No  results for input(s): DDIMER in the last 72 hours.  Cardiac Enzymes  Recent Labs Lab 11/18/14 0321 11/18/14 0845 11/18/14 1536  TROPONINI 0.12* 0.04* 0.04*   ------------------------------------------------------------------------------------------------------------------ Invalid input(s): POCBNP   Time Spent in minutes  35   Tinna Kolker K M.D on 11/20/2014 at 10:59 AM  Between 7am to 7pm - Pager - [phone]  After 7pm go to [email or web address] - password [facility]  [facility] -  Office  [phone]

## 2014-11-20 NOTE — Care Management Important Message (Signed)
Important Message  Patient Details  Name: [name] MRN: [id number] Date of Birth: 14-May-1929   Medicare Important Message Given:  Yes-second notification given    Kyla Balzarine 11/20/2014, 1:55 PMImportant Message  Patient Details  Name: [name] MRN: [id number] Date of Birth: 11-May-1929   Medicare Important Message Given:  Yes-second notification given    Kyla Balzarine 11/20/2014, 1:55 PM

## 2014-11-20 NOTE — Anesthesia Postprocedure Evaluation (Signed)
  Anesthesia Post-op Note  Patient: [name]  Procedure(s) Performed: Procedure(s): ENDOSCOPIC RETROGRADE CHOLANGIOPANCREATOGRAPHY (ERCP) (N/A)  Patient Location: PACU  Anesthesia Type:General  Level of Consciousness: awake  Airway and Oxygen Therapy: Patient Spontanous Breathing and Patient connected to nasal cannula oxygen  Post-op Pain: none  Post-op Assessment: Post-op Vital signs reviewed, Patient's Cardiovascular Status Stable, Respiratory Function Stable, Patent Airway, No signs of Nausea or vomiting and Pain level controlled LLE Motor Response: Purposeful movement, Responds to commands   RLE Motor Response: Purposeful movement, Responds to commands        Post-op Vital Signs: Reviewed and stable  Last Vitals:  Filed Vitals:   11/20/14 1224  BP:   Pulse:   Temp: 36.5 C  Resp:     Complications: No apparent anesthesia complications

## 2014-11-20 NOTE — H&P (View-Only) (Signed)
[facility] Consult: 9:18 AM 11/18/2014  LOS: 0 days    Referring Provider: Dr Candiss Norse  Primary Care Physician:  Leonard Downing, MD Primary Gastroenterologist:  Dr. Deatra Ina      Reason for Consultation:  Pancreatic/duodenal mass and elevated LFTs.    HPI: [name] is a 81 y.o. female.  O2 dependent COPD. CAD, diastolic dysfunction.  S/p CABG2005 and stent 06/2012.  Parox A fib.  4.1 cm thoracic aortic aneurysm.  IDDM.  S/p cholecystectomy. ERCP/sphincterotomy 2008 for abnormal cholangiogram.  Multiple stones removed from CHD 10/2006 colonoscopy and EGD for hematochezia, remote adenomatous polyps in setting of cardiac cath, plavix and integrillin: non-bleeding cecal AVMs, transverse , descending and sigmoid tics.    Takes Protonix daily, no NSAIDs except 81 ASA at home.   In declining mental status with loss of memory over one year.  Chronic issues with nausea and upset stomach but not much vomiting for years.  Chronic constipation.  2 months of anorexia and more recently increased nausea with non-bloody emesis.  Loose, non-bloody stools as opposed to the normal constipation. 7/3 visit to ED with weakness.  Labs with Alk phos 158, AST/ALT 91/184, normal t bili.  Ultrasound: s/p chole, heterogenous liver with no discrete mass, 5 mm CBD. Sent home on prn Zofran.  Later got Rx for promethazine which helped though anorexia and nausea persisted.  Follow up 7/22 with Dr Arelia Sneddon, told family to stop her Aricept and stick with Zofran.  Yesterday, was profoundly weak, babbling with AMS.  C/o SOB and chest pressure.   Family brought her to ED.    Labs with t bili 5, alk phos 301, AST/ALT 353/415.  troponins 0.04, 0.12. CT scan with mass at pancreatic head and uncinate, and D2.  Sigmoid tics, hazy lesion in right  inferior lobe liver.  MRCP planned today if she can tolerate.  ROS:  Chronic itching for years.  Chronic cough, worsening SOB.  Urine concentrated and dark.  No jaundice.  No bleeding PR or in emesis.  No chills, fever.  Right shoulder pain in the joint itself, unable to raise that arm.  No LE edema.      Past Medical History  Diagnosis Date  . CAD (coronary artery disease)     a. s/p CABG x5 (2005) b. s/p BMS-prox SVG-D2 (2014)  . Hypertension   . Hyperlipidemia   . GI bleed     AVMs  . Aneurysm, aortic     thoracic aorta, stable at 4.1 cm, chest CT, May, 2012  . Syncope     Nitroglycerin plus a diuretic, April, 2009  . Hyponatremia     Chronic. Felt secondary to SIADH   . COPD (chronic obstructive pulmonary disease)     on home oxygen  . Tobacco abuse   . Bradycardia   . Carotid artery disease     Hx of endarterectomy. Doppler October, 2011, stable, 3-54% RIC A., 65-68% LICA  . Tuberculosis     Exposures to tuberculosis 1970s, has tested negative by the health Department  . Atrial  fibrillation     Paroxysmal with RVR 07/2011, not felt to be a coumadin candidate secondary to hx of GIB/AVM  . Venous stasis of lower extremity     Chronic  . Ejection fraction     a. EF 55-60%, echo, April, 2013 b. EF 55-60%, mild biatrial enlargement and PASP 42 mmH  . CHF with left ventricular diastolic dysfunction, NYHA class 2   . AAA (abdominal aortic aneurysm)   . Complication of anesthesia   . Hx of CABG     CABG 2005  . Hematoma     Right groin hematoma, small, post cath, April, 2014  . Mitral regurgitation     Mild, hospital, March, 2014  . Leg ulcer     Ulcerated lesion on the anterior aspect of her left lower leg, June, 2014    Past Surgical History  Procedure Laterality Date  . Cholecystectomy    . Carotid endartercetomy    . Abdominal hysterectomy    . Coronary artery bypass graft  2005  . Coronary angioplasty with stent placement  07/18/12    severe native coronary  artery disease, 99% proximal stenosis of the SVG-D2 status post successful PTCA/PCI with a Veriflex bare-metal stent, widely patent SVGs to both the right PDA sequential OM1 to OM 2, EF 65-70% and 3+ mitral regurgitation  . Left heart catheterization with coronary angiogram N/A 07/18/2012    Procedure: LEFT HEART CATHETERIZATION WITH CORONARY ANGIOGRAM;  Surgeon: David W Harding, MD;  Location: [facility] CATH [facility];  Service: Cardiovascular;  Laterality: N/A;  . Percutaneous coronary stent intervention (pci-s)  07/18/2012    Procedure: PERCUTANEOUS CORONARY STENT INTERVENTION (PCI-S);  Surgeon: David W Harding, MD;  Location: [facility] CATH [facility];  Service: Cardiovascular;;    Prior to Admission medications   Medication Sig Start Date End Date Taking? Authorizing Provider  albuterol (PROVENTIL HFA;VENTOLIN HFA) 108 (90 BASE) MCG/ACT inhaler Inhale 2 puffs into the lungs every 4 (four) hours as needed for wheezing or shortness of breath. 07/16/14   Tammy S Parrett, NP  albuterol (PROVENTIL) (2.5 MG/3ML) 0.083% nebulizer solution Take 2.5 mg by nebulization every 6 (six) hours as needed for wheezing.    Historical Provider, MD  ALPRAZolam (XANAX) 0.25 MG tablet Take 0.25 mg by mouth at bedtime as needed for anxiety.    Historical Provider, MD  aspirin EC 81 MG tablet Take 81 mg by mouth daily.    Historical Provider, MD  atorvastatin (LIPITOR) 40 MG tablet Take 0.5 tablets (20 mg total) by mouth daily at 6 PM. 11/07/13   Lori C Gerhardt, NP  budesonide-formoterol (SYMBICORT) 160-4.5 MCG/ACT inhaler Inhale 2 puffs into the lungs 2 (two) times daily. 06/14/14   Rakesh Alva V, MD  CARTIA XT 300 MG 24 hr capsule TAKE 1 CAPSULE DAILY 10/25/14   Jeffrey D Katz, MD  furosemide (LASIX) 40 MG tablet Take 1 tablet (40 mg total) by mouth 2 (two) times daily. 08/29/13   Jeffrey D Katz, MD  insulin aspart protamine- aspart (NOVOLOG MIX 70/30) (70-30) 100 UNIT/ML injection Inject into the skin.    Historical Provider, MD  magnesium oxide  (MAG-OX) 400 MG tablet Take 400 mg by mouth daily.    Historical Provider, MD  metFORMIN (GLUCOPHAGE) 500 MG tablet Take 500 mg by mouth 2 (two) times daily with a meal.    Historical Provider, MD  metoprolol succinate (TOPROL-XL) 25 MG 24 hr tablet Take 1 tablet (25 mg total) by mouth daily. 11/24/13   Jeffrey D Katz,   MD  Multiple Vitamins-Minerals (PRESERVISION AREDS 2 PO) Take 1 tablet by mouth 2 (two) times daily.    Historical Provider, MD  nitroGLYCERIN (NITROSTAT) 0.4 MG SL tablet Place 0.4 mg under the tongue every 5 (five) minutes as needed for chest pain.    Historical Provider, MD  ondansetron (ZOFRAN) 8 MG tablet Take 1 tablet (8 mg total) by mouth every 8 (eight) hours as needed for nausea or vomiting. 10/28/14   Elliott Wentz, MD  OXYGEN Inhale into the lungs. 2L continously    Historical Provider, MD  pantoprazole (PROTONIX) 40 MG tablet Take 40 mg by mouth daily.    Historical Provider, MD  potassium chloride SA (K-DUR,KLOR-CON) 20 MEQ tablet Take 20 mEq by mouth 2 (two) times daily.    Historical Provider, MD  SPIRIVA HANDIHALER 18 MCG inhalation capsule INHALE THE CONTENTS OF 1 CAPSULE DAILY 09/10/14   Rakesh Alva V, MD  Tiotropium Bromide Monohydrate (SPIRIVA RESPIMAT) 2.5 MCG/ACT AERS Inhale 2 puffs into the lungs daily. 07/16/14   Tammy S Parrett, NP  Vitamin D, Ergocalciferol, (DRISDOL) 50000 UNITS CAPS Take 50,000 Units by mouth every 14 (fourteen) days. Every other Sunday    Historical Provider, MD    Scheduled Meds: . antiseptic oral rinse  7 mL Mouth Rinse BID  . aspirin EC  81 mg Oral Daily  . atorvastatin  20 mg Oral q1800  . budesonide-formoterol  2 puff Inhalation BID  . cefTAZidime (FORTAZ)  IV  1 g Intravenous Q12H  . diltiazem  300 mg Oral Daily  . heparin  5,000 Units Subcutaneous 3 times per day  . insulin aspart  0-15 Units Subcutaneous TID WC  . insulin aspart  0-5 Units Subcutaneous QHS  . LORazepam  1 mg Intravenous Once  . metoprolol succinate  25 mg Oral  Daily  . pantoprazole  40 mg Oral Daily  . sodium chloride  3 mL Intravenous Q12H  . tiotropium  1 capsule Inhalation Daily  . vancomycin  1,000 mg Intravenous Q24H   Infusions: . 0.9 % NaCl with KCl 40 mEq / L 75 mL/hr (11/18/14 0810)  . diltiazem (CARDIZEM) infusion 15 mg/hr (11/18/14 0810)   PRN Meds: acetaminophen **OR** acetaminophen, ALPRAZolam, ondansetron **OR** ondansetron (ZOFRAN) IV   Allergies as of 11/17/2014 - Review Complete 11/17/2014  Allergen Reaction Noted  . Other Other (See Comments) 04/28/2013  . Codeine Nausea And Vomiting   . Sulfonamide derivatives Nausea And Vomiting   . Warfarin  11/20/2013  . Penicillins Itching, Rash, and Other (See Comments)   . Ranitidine hcl Itching and Rash     Family History  Problem Relation Age of Onset  . Stroke Sister   . Heart failure Mother   . Cancer Sister     Breast and Lung   . Cancer Brother     breast cancer    History   Social History  . Marital Status: Widowed    Spouse Name: N/A  . Number of Children: 1  . Years of Education: N/A   Occupational History  . retired     [employer]   Social History Main Topics  . Smoking status: Former Smoker -- 0.50 packs/day for 59 years    Types: Cigarettes    Quit date: 02/01/2013  . Smokeless tobacco: Not on file  . Alcohol Use: No  . Drug Use: Not on file  . Sexual Activity: Not on file   Other Topics Concern  . Not on file   Social History   Narrative    REVIEW OF SYSTEMS: Constitutional:  Per HPI ENT:  No nose bleeds Pulm:  Per hpi.   CV:  No palpitations, no LE edema.  GU:  No hematuria, no frequency GI:  Per hpi Heme:  No unusual bleeding or bruising   Transfusions:  None per her recall Neuro:  No headaches, no peripheral tingling or numbness Derm:  No itching, no rash or sores.  Endocrine:  No sweats or chills.  No polyuria or dysuria Immunization:  Not queried Travel:  None beyond local counties in last few months.    PHYSICAL EXAM: Vital  signs in last 24 hours: Filed Vitals:   11/18/14 0400  BP: 106/61  Pulse: 112  Temp: 98.5 F (36.9 C)  Resp: 22   Wt Readings from Last 3 Encounters:  11/18/14 185 lb 6.5 oz (84.1 kg)  07/16/14 185 lb (83.915 kg)  05/08/14 191 lb 5.8 oz (86.8 kg)    General: frail, unwell looking WF.   Head:  No swelling or assymetry  Eyes:  Clear, no icterus, no pallor Ears:  HOH  Nose:  No congestion or discharge Mouth:  Dry mm but clear,  Teeth missing Neck:  No jvd or bruit.  No mass, no TMG Lungs:  Clear bil but very diminished sounds.  Cough and moderately labored breathing Heart: irreg, irreg: afib on tele.  No mrg.  S1/s2 audible Abdomen:  Soft, protuberant, distended, fine incision scar at umbilicus.   Rectal: deferred   Musc/Skeltl: no joint redness or swelling Extremities:  No CCE  Neurologic:  Oriented to year,not month.  To self and to [facility].  Alert.  Moves all 4s, strength not tested.  Skin:  Silvery plaques with excoriation/scabs on extensor surfaces of both arms and left leg Tattoos:  none Nodes:  No cervical adenopathy   Psych:  Pleasant, cooperative, not agitated.   Intake/Output from previous day: 07/23 0701 - 07/24 0700 In: 50 [IV Piggyback:50] Out: 413 [Urine:413] Intake/Output this shift:    LAB RESULTS:  Recent Labs  11/17/14 2222 11/18/14 0321  WBC 17.9* 18.2*  HGB 11.9* 9.9*  HCT 36.2 30.3*  PLT 192 155  MCV     87  BMET Lab Results  Component Value Date   NA 131* 11/18/2014   NA 133* 11/17/2014   NA 135 10/28/2014   K 3.4* 11/18/2014   K 3.4* 11/17/2014   K 3.5 10/28/2014   CL 89* 11/18/2014   CL 87* 11/17/2014   CL 89* 10/28/2014   CO2 32 11/18/2014   CO2 33* 11/17/2014   CO2 33* 10/28/2014   GLUCOSE 177* 11/18/2014   GLUCOSE 189* 11/17/2014   GLUCOSE 166* 10/28/2014   BUN 13 11/18/2014   BUN 12 11/17/2014   BUN 12 10/28/2014   CREATININE 1.26* 11/18/2014   CREATININE 1.22* 11/17/2014   CREATININE 1.19* 10/28/2014   CALCIUM 9.2  11/18/2014   CALCIUM 10.3 11/17/2014   CALCIUM 10.0 10/28/2014   LFT  Recent Labs  11/17/14 2222 11/18/14 0321  PROT 7.0 5.7*  ALBUMIN 3.6 2.9*  AST 353* 223*  ALT 415* 300*  ALKPHOS 301* 230*  BILITOT 5.0* 4.1*   PT/INR Lab Results  Component Value Date   INR 1.32 11/18/2014   INR 1.13 11/17/2014   INR 1.04 07/18/2012   Hepatitis Panel No results for input(s): HEPBSAG, HCVAB, HEPAIGM, HEPBIGM in the last 72 hours. C-Diff No components found for: CDIFF Lipase     Component Value Date/Time   LIPASE  28 11/17/2014 2222    Drugs of Abuse  No results found for: LABOPIA, COCAINSCRNUR, LABBENZ, AMPHETMU, THCU, LABBARB   RADIOLOGY STUDIES: Ct Abdomen Pelvis W Contrast  11/18/2014   CLINICAL DATA:  85-year-old female with fever and abdominal pain.  EXAM: CT ABDOMEN AND PELVIS WITH CONTRAST  TECHNIQUE: Multidetector CT imaging of the abdomen and pelvis was performed using the standard protocol following bolus administration of intravenous contrast.  CONTRAST:  80mL OMNIPAQUE IOHEXOL 300 MG/ML  SOLN  COMPARISON:  CT dated 05/04/2014 and ultrasound dated 10/28/2014  FINDINGS: Evaluation is limited due to streak artifact caused by patient's arms and metallic left hip arthroplasty.  Emphysematous changes of the lung bases. There is coronary vascular calcification. No intra-abdominal free air or free fluid.  There is mild hepatic surface irregularity. A 1.6 x 1.0 cm hypodense lesion in the inferior right lobe of the liver (series 201 image 37) is not well characterized. MRI may provide better evaluation. Cholecystectomy. The pancreas is atrophic. There is an ill-defined soft tissue prominence involving the head of the pancreas at the ampulla of Vater. This area is not well evaluated on the CT. The the capillary mass or pancreatic lesion is not excluded. Endoscopic ultrasound is recommended for better evaluation. The spleen, adrenal glands appear unremarkable. There is moderate bilateral renal  atrophy. There is no hydronephrosis. The visualized ureters and urinary bladder appear unremarkable. Hysterectomy.  Extensive sigmoid diverticulosis with muscular hypertrophy. No definite active inflammatory changes. Moderate stool in the distal colon. No evidence of bowel obstruction. There is a 1.6 cm duodenal diverticulum. There is soft tissue density along the medial wall of the second portion of the duodenum (series 201, image 35). Normal appendix.  There is aortoiliac atherosclerotic disease. A 3.2 cm infrarenal abdominal aortic aneurysm. The origins of the celiac axis, SMA, IMA as well as the origins of the renal arteries appear patent. No portal venous gas identified. Ill-defined nodular soft tissue density in the region of the porta hepatis likely represent lymph nodes. There is diastases of anterior abdominal wall musculature in the midline with a small fat containing umbilical hernia. Degenerative changes of the spine. Left hip arthroplasty. No acute fracture.  IMPRESSION: Ill-defined soft tissue thickness involving the head and uncinate process of the pancreas as well as the medial wall of the second portion of the duodenum. MRI or endoscopic ultrasound is recommended for further evaluation.  Sigmoid diverticulosis. No evidence of bowel obstruction or inflammation. Normal appendix.  Incompletely characterized hypodense lesion along the inferior surface of the right lobe of the liver. MRI may provide better characterization.   Electronically Signed   By: Arash  Radparvar M.D.   On: 11/18/2014 01:03   Dg Chest Portable 1 View  11/17/2014   CLINICAL DATA:  85-year-old female with shortness of breath and tachycardia.  EXAM: PORTABLE CHEST - 1 VIEW  COMPARISON:  Radiograph dated 10/28/2014  FINDINGS: Single-view of the chest demonstrate emphysematous changes of the lungs. There is no focal consolidation, pleural effusion, or pneumothorax. The cardiomediastinal silhouette is within normal limits.  Degenerative changes of the osseous structures.  IMPRESSION: Emphysema.  No focal consolidation or pneumothorax.   Electronically Signed   By: Arash  Radparvar M.D.   On: 11/17/2014 22:50    ENDOSCOPIC STUDIES: Per HPI  IMPRESSION:   *  Elevated LFTs with pancreatic vs duodenal mass on CT.  MRCP ordered 2 months of anorexia, n/v.   *  Oxygen dependent COPD  *  A fib, rate in lower   100s.  Minor elevation of 2 Troponins. Hx CABG, stents, CAD.    *  AMS.  Background of dementia with 1 years decline.  Acute altered MS has improved overnight.  *  Normocytic anemia.  *  Hx GI bleed in setting of antiplatlet therapy in 2008: ? Diverticular vs secondary to AVMs.     PLAN:     *  Await MRCP.  Will likely need ERCP.     Azucena Freed  11/18/2014, 9:18 AM Pager: [phone] Attending MD note:   I have taken a history, examined the patient, and reviewed the chart. Just returned from MRCP- poor quality due to motion effect. No dilation of bile ducts ( extra or intrahepatic), mild focal dilation at the porta hepatis, with questionable periportal lymph node .No  evidence of pancreatic mass .Question intrahepatic cholestasis. Would check hepatitis serologies, ANA, AntiSM antib, ,Cerruloplasmin,ammonia level. Coags are normal as is splenic size.Would wait with ERCP ,need more information. DC LIpitor,    [facility] Pager # [phone]

## 2014-11-20 NOTE — Op Note (Signed)
[facility] [address] [place], [address]   ERCP PROCEDURE REPORT        EXAM DATE: 11/20/2014  PATIENT NAME:          [name], [name]          MR #: [id number] BIRTHDATE:       1929/05/25     VISIT #:     [phone] ATTENDING:     Meryl Dare, MD, Clementeen Graham     STATUS:     inpatient  ASSISTANT:      Nilsa Nutting and Waynard Edwards  INDICATIONS:  The patient is a 81 yr old female here for an ERCP due to abnormal abdominal CT, abnormal MRCP, and abnormal liver function test . PROCEDURE PERFORMED:     ERCP with sphincterotomy/papillotomy ERCP with removal of calculus/calculi and brush cytology  MEDICATIONS:     Per Anesthesia CONSENT: The patient understands the risks and benefits of the procedure and understands that these risks include, but are not limited to: sedation, allergic reaction, infection, perforation and/or bleeding. Alternative means of evaluation and treatment include, among others: physical exam, x-rays, and/or surgical intervention. The patient elects to proceed with this endoscopic procedure. DESCRIPTION OF PROCEDURE: During intra-op preparation period all mechanical & medical equipment was checked for proper function. Hand hygiene and appropriate measures for infection prevention was taken. After the risks, benefits and alternatives of the procedure were thoroughly explained, Informed was verified, confirmed and timeout was successfully executed by the treatment team. With the patient in left semi-prone position, medications were administered intravenously.The Pentax Ercp Scope [id number] was passed from the mouth into the esophagus and further advanced from the esophagus into the stomach. From stomach scope was directed to the second portion of the duodenum.  Major papilla was aligned with the duodenoscope. The scope position was confirmed fluoroscopically. Rest of the findings/therapeutics are given below. The scope  was then completely withdrawn from the patient and the procedure completed. The pulse, BP, and O2 saturation were monitored and documented by the physician and the nursing staff throughout the entire procedure. The patient was cared for as planned according to standard protocol. The patient was then discharged to recovery in stable condition and with appropriate post procedure care. Estimated blood loss is zero unless otherwise noted in this procedure report.  Ampulla had a small prior sphincterotomy. The CBD was easily, freely cannulated . Guidewire advanced into the biliary tree and contrast injected. There was mild dilation of the intraheptic ducts. Two round filling defects were noted in the distal CBD.  A subtle smooth stenosis was noted in the proximal common bile duct. Balloon had mild resistance to passing this area. Brush cytology of stenosis performed however extrinic compression suspected. Stent was not placed as CBD had a substantial lumen at the stenosis and LFTs had significantly dropped.  With guidewire in the bile duct, a biliary sphincterotomy was performed using sphincterotome to enlarge the sphincterotomy to allow balloon pull throughs. No stones noted after 2 pull throughs and the filling defects appeared to be air bubbles. Final occusion cholangiogram did not reveal any filling defects.  PD not cannulated or injected with contrast by intention. Excellent biliary drainage noted.    ADVERSE EVENT:     There were no complications. IMPRESSIONS:     1.  Sublte smooth stenosis in the proximal common bile duct; brush cytology obtained 2.  Prior small sphincterotomy noted; biliary sphincterotomy extended 3.  Filling defects in CBD  appeared to be air bubbles  RECOMMENDATIONS:     1.  Trend liver enzymes 2.  Continue antibiotics 3.  Await cytology 4.  Consider elective EUS    Meryl Dare, MD, Clement[facility] Graham eSigned:  Meryl Dare, MD, [facility] 11/20/2014 9:43 AM  cc:  [facility]      PATIENT NAME:  [name] MR#: [id number]

## 2014-11-20 NOTE — Clinical Social Work Note (Signed)
CSW reviewed chart. Once pt is medically stable she can transition to [facility]. CSW will continue to follow and assist with discharge.   Petina Muraski, MSW, LCSWA [phone]

## 2014-11-20 NOTE — Anesthesia Preprocedure Evaluation (Signed)
Anesthesia Evaluation  Patient identified by MRN, date of birth, ID band Patient awake    Reviewed: Allergy & Precautions, NPO status , Patient's Chart, lab work & pertinent test results  History of Anesthesia Complications Negative for: history of anesthetic complications  Airway Mallampati: II  TM Distance: >3 FB Neck ROM: Full    Dental  (+) Chipped, Poor Dentition   Pulmonary shortness of breath, COPDformer smoker,  breath sounds clear to auscultation        Cardiovascular hypertension, Pt. on medications - angina+ CAD, + CABG, + Peripheral Vascular Disease and +CHF + dysrhythmias Atrial Fibrillation + Valvular Problems/Murmurs MR Rhythm:Irregular     Neuro/Psych negative neurological ROS  negative psych ROS   GI/Hepatic Neg liver ROS, GERD-  Medicated,Elevated liver enzymes, pancreatic mass   Endo/Other    Renal/GU Renal InsufficiencyRenal disease     Musculoskeletal   Abdominal   Peds  Hematology  (+) anemia ,   Anesthesia Other Findings   Reproductive/Obstetrics                             Anesthesia Physical Anesthesia Plan  ASA: III  Anesthesia Plan: General   Post-op Pain Management:    Induction: Intravenous  Airway Management Planned: Oral ETT  Additional Equipment: None  Intra-op Plan:   Post-operative Plan: Extubation in OR  Informed Consent: I have reviewed the patients History and Physical, chart, labs and discussed the procedure including the risks, benefits and alternatives for the proposed anesthesia with the patient or authorized representative who has indicated his/her understanding and acceptance.   Dental advisory given  Plan Discussed with: CRNA and Surgeon  Anesthesia Plan Comments:         Anesthesia Quick Evaluation

## 2014-11-20 NOTE — Interval H&P Note (Signed)
History and Physical Interval Note:  11/20/2014 8:43 AM  [name]  has presented today for surgery, with the diagnosis of elevated lfts, ? pancreatic mass.  The various methods of treatment have been discussed with the patient and family. After consideration of risks, benefits and other options for treatment, the patient has consented to  Procedure(s): ENDOSCOPIC RETROGRADE CHOLANGIOPANCREATOGRAPHY (ERCP) (N/A) as a surgical intervention .  The patient's history has been reviewed, patient examined, no change in status, stable for surgery.  I have reviewed the patient's chart and labs.  Questions were answered to the patient's satisfaction.     Venita Lick. Russella Dar

## 2014-11-20 NOTE — Progress Notes (Signed)
Pt c/o constant headache 10/10. She states current pain medication is not working. Dr. Suszanne Conners notified. New orders received to administer  morphine Q2H IV. Will carry out orders and continue to monitor.

## 2014-11-20 NOTE — Anesthesia Procedure Notes (Signed)
Procedure Name: Intubation Date/Time: 11/20/2014 8:57 AM Performed by: Jerilee Hoh Pre-anesthesia Checklist: Patient identified, Emergency Drugs available, Suction available and Patient being monitored Patient Re-evaluated:Patient Re-evaluated prior to inductionOxygen Delivery Method: Circle system utilized Preoxygenation: Pre-oxygenation with 100% oxygen Intubation Type: IV induction Ventilation: Mask ventilation without difficulty Laryngoscope Size: Mac and 3 Grade View: Grade II Tube type: Oral Tube size: 7.0 mm Number of attempts: 1 Airway Equipment and Method: Stylet Placement Confirmation: ETT inserted through vocal cords under direct vision,  positive ETCO2 and breath sounds checked- equal and bilateral Secured at: 22 cm Tube secured with: Tape Dental Injury: Teeth and Oropharynx as per pre-operative assessment and Injury to lip  Comments: Very small laceration noted to upper lip after intubation.

## 2014-11-21 ENCOUNTER — Other Ambulatory Visit (HOSPITAL_COMMUNITY): Payer: Medicare Other

## 2014-11-21 ENCOUNTER — Encounter (HOSPITAL_COMMUNITY): Payer: Self-pay | Admitting: Gastroenterology

## 2014-11-21 LAB — COMPREHENSIVE METABOLIC PANEL
ALT: 105 U/L — AB (ref 14–54)
AST: 23 U/L (ref 15–41)
Albumin: 2.7 g/dL — ABNORMAL LOW (ref 3.5–5.0)
Alkaline Phosphatase: 158 U/L — ABNORMAL HIGH (ref 38–126)
Anion gap: 7 (ref 5–15)
BILIRUBIN TOTAL: 1.2 mg/dL (ref 0.3–1.2)
BUN: 28 mg/dL — AB (ref 6–20)
CALCIUM: 9.4 mg/dL (ref 8.9–10.3)
CHLORIDE: 98 mmol/L — AB (ref 101–111)
CO2: 26 mmol/L (ref 22–32)
CREATININE: 1.22 mg/dL — AB (ref 0.44–1.00)
GFR calc non Af Amer: 40 mL/min — ABNORMAL LOW (ref >60)
GFR, EST AFRICAN AMERICAN: 46 mL/min — AB (ref >60)
Glucose, Bld: 182 mg/dL — ABNORMAL HIGH (ref 65–99)
Potassium: 4.3 mmol/L (ref 3.5–5.1)
SODIUM: 131 mmol/L — AB (ref 135–145)
Total Protein: 5.5 g/dL — ABNORMAL LOW (ref 6.5–8.1)

## 2014-11-21 LAB — CULTURE, BLOOD (ROUTINE X 2)

## 2014-11-21 LAB — CBC
HCT: 27.5 % — ABNORMAL LOW (ref 36.0–46.0)
Hemoglobin: 9.2 g/dL — ABNORMAL LOW (ref 12.0–15.0)
MCH: 29.5 pg (ref 26.0–34.0)
MCHC: 33.5 g/dL (ref 30.0–36.0)
MCV: 88.1 fL (ref 78.0–100.0)
Platelets: 170 10*3/uL (ref 150–400)
RBC: 3.12 MIL/uL — ABNORMAL LOW (ref 3.87–5.11)
RDW: 14 % (ref 11.5–15.5)
WBC: 8.4 10*3/uL (ref 4.0–10.5)

## 2014-11-21 LAB — GLUCOSE, CAPILLARY
GLUCOSE-CAPILLARY: 216 mg/dL — AB (ref 65–99)
Glucose-Capillary: 197 mg/dL — ABNORMAL HIGH (ref 65–99)
Glucose-Capillary: 176 mg/dL — ABNORMAL HIGH (ref 65–99)
Glucose-Capillary: 129 mg/dL — ABNORMAL HIGH (ref 65–99)

## 2014-11-21 MED ORDER — DILTIAZEM HCL 90 MG PO TABS: 90.0000 mg | ORAL_TABLET | Freq: Three times a day (TID) | ORAL | Status: DC

## 2014-11-21 MED ORDER — DILTIAZEM HCL 60 MG PO TABS
120.0000 mg | ORAL_TABLET | Freq: Three times a day (TID) | ORAL | Status: DC
  Administered 2014-11-21 – 2014-11-23 (×7): 120 mg via ORAL
  Filled 2014-11-21 (×9): qty 2

## 2014-11-21 MED ORDER — DOCUSATE SODIUM 100 MG PO CAPS: 100.0000 mg | ORAL_CAPSULE | Freq: Every day | ORAL | Status: DC | PRN

## 2014-11-21 MED ORDER — SODIUM CHLORIDE 0.9 % IV SOLN
INTRAVENOUS | Status: DC
  Administered 2014-11-21 (×2): via INTRAVENOUS

## 2014-11-21 NOTE — Progress Notes (Addendum)
Daily Rounding Note  11/21/2014, 8:35 AM  LOS: 3 days   SUBJECTIVE:       Appetite better. No abdominal pain or nausea.  No stool for several days.  Breathing at her baseline of dyspnea.    OBJECTIVE:         Vital signs in last 24 hours:    Temp:  [97 F (36.1 C)-98.5 F (36.9 C)] 98.5 F (36.9 C) (07/27 0337) Pulse Rate:  [65-125] 80 (07/27 0400) Resp:  [17-20] 19 (07/26 1005) BP: (124-170)/(65-93) 131/65 mmHg (07/27 0400) SpO2:  [95 %-100 %] 98 % (07/27 0400) Weight:  [196 lb 3.4 oz (89 kg)] 196 lb 3.4 oz (89 kg) (07/27 0500) Last BM Date: 11/18/14 Filed Weights   11/19/14 0500 11/20/14 0500 11/21/14 0500  Weight: 190 lb 11.2 oz (86.5 kg) 193 lb 5.5 oz (87.7 kg) 196 lb 3.4 oz (89 kg)   General: looks chronically ill, but a little better overall   Heart: RRR Chest: diminished, some rales in bases Abdomen: soft, NT.  Active BS  Extremities: no CCE Neuro/Psych:  Pleasant, appropriate.  Moves all 4s.   Intake/Output from previous day: 07/26 0701 - 07/27 0700 In: 1587.2 [P.O.:960; I.V.:627.2] Out: 800 [Urine:800]  Intake/Output this shift:    Lab Results:  Recent Labs  11/19/14 0235 11/20/14 0259 11/21/14 0246  WBC 9.4 6.4 8.4  HGB 9.5* 9.0* 9.2*  HCT 29.9* 27.3* 27.5*  PLT 136* 138* 170   BMET  Recent Labs  11/19/14 0235 11/20/14 0259 11/21/14 0246  NA 133* 131* 131*  K 3.9 4.4 4.3  CL 95* 97* 98*  CO2 28 27 26   GLUCOSE 117* 163* 182*  BUN 18 18 28*  CREATININE 1.26* 1.04* 1.22*  CALCIUM 9.2 9.0 9.4   LFT  Recent Labs  11/19/14 0235 11/20/14 0259 11/21/14 0246  PROT 5.5* 5.5* 5.5*  ALBUMIN 2.8* 2.7* 2.7*  AST 87* 34 23  ALT 198* 137* 105*  ALKPHOS 199* 178* 158*  BILITOT 2.8* 1.6* 1.2   PT/INR No results for input(s): LABPROT, INR in the last 72 hours. Hepatitis Panel No results for input(s): HEPBSAG, HCVAB, HEPAIGM, HEPBIGM in the last 72 hours.  Studies/Results: Dg  Ercp Biliary & Pancreatic Ducts  11/20/2014   CLINICAL DATA:  81 year old female with choledocholithiasis  EXAM: ERCP  TECHNIQUE: Multiple spot images obtained with the fluoroscopic device and submitted for interpretation post-procedure.  FLUOROSCOPY TIME:  Please see operative report for further detail.  COMPARISON:  MRCP 11/18/2014  FINDINGS: A total of 8 intraoperative spot films demonstrate a flexible endoscope in the descending duodenum followed by wire cannulation of the common bile duct and cholangiogram. Cholangiogram demonstrates several rounded filling defects in the distal common bile duct consistent with choledocholithiasis. The images demonstrate balloon sweep of the common bile duct. On the final image, no further filling defects are identified.  IMPRESSION: Choledocholithiasis with sphincterotomy and balloon sweep of the common bile duct.  These images were submitted for radiologic interpretation only. Please see the procedural report for the amount of contrast and the fluoroscopy time utilized.   Electronically Signed   By: Malachy Moan M.D.   On: 11/20/2014 09:42    ASSESMENT:   * Elevated LFTs, improving. Lipase 28. ~ 2 months anorexia, increase of chronic nausea, no abd pain.  Ultrasound 7/3: heterogenous liver, no mass, 5 mm CBD.  CT with pancreatic vs duodenal mass, right liver lesion.  MRI without MRCP:  central, mild, intrahepatic duct dilatation. Mild periportal edema. TSTC hyperintense, hepatic lesions: likely cysts. Trace porta hepatis stranding. Irregluar soft tissue at porta hepatis poorly measured due to motion artifact. Normal diameter CBD. Pancreas unremarkable. ERCP 7/26: subtle stenosis of CBD, brush cytology obtained and pending. Filling defects, likely air bubbles. Previous small sphincterotomy extended, balloon sweep delivered golden bile.  LFTs continue to improve.  CA19-9: 88.   *   Normocytic anemia, chronic.  Stable.    PLAN   *  Will arrange  GI office follow up with Dr. Darrel Hoover, consider scheduling EUS at that time. Cytology on duct brushings showed benign cells.    Jennye Moccasin  11/21/2014, 8:35 AM Pager: [phone]    Attending physician's note   I have taken an interval history, reviewed the chart and examined the patient. I agree with the Advanced Practitioner's note, impression and recommendations. GNR sepsis. Clinically improved. LFTs markedly improved. She may have passed a CBD stone prior to yesterday's ERCP. Brush cytology of subtle CBD stenosis showed benign cells. Awaiting ID of GNR. Continue antibiotics. GI signing off. Follow up office visit with Dr. Melvia Heaps. She may need an elective EUS which can be arranged at office visit.    Venita Lick. Russella Dar, MD [facility]

## 2014-11-21 NOTE — Progress Notes (Signed)
  Pt admitted to the unit. Pt is stable, alert and oriented per baseline. Oriented to room, staff, and call bell. Educated to call for any assistance. Bed in lowest position, call bell within reach- will continue to monitor. 

## 2014-11-21 NOTE — Progress Notes (Signed)
UR COMPLETED  

## 2014-11-21 NOTE — Progress Notes (Signed)
Physical Therapy Treatment Patient Details Name: [name] MRN: [id number] DOB: [birth year] Today's Date: 11/21/2014    History of Present Illness Patient is a 81 y/o female with hx of CAD s/p CABG, GI bleed, A-fib not on any anticoagulation, HTN, PVD, COPD, chronic respiratory failure on oxygen, chronic diastolic dysfunction presents with SOB. Admited with sepsis w/ unclear presentation with generalized weakness, leukocytosis, encephalopathy. 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 Workup pending.    PT Comments    Progressing slowly.  Still not fully oriented to situation, DOE with sats maintained >92% on 5L, but not lowered to see if 4 L would be sufficient.   Follow Up Recommendations  SNF;Supervision/Assistance - 24 hour     Equipment Recommendations  None recommended by PT    Recommendations for Other Services       Precautions / Restrictions Precautions Precautions: Fall Precaution Comments: monitor HR    Mobility  Bed Mobility Overal bed mobility: Needs Assistance Bed Mobility: Supine to Sit     Supine to sit: Supervision     General bed mobility comments: use of the rail  Transfers Overall transfer level: Needs assistance Equipment used: Rolling walker (2 wheeled) Transfers: Sit to/from Stand Sit to Stand: Min assist         General transfer comment: assist to come forward  Ambulation/Gait Ambulation/Gait assistance: Min assist Ambulation Distance (Feet): 60 Feet (with 1 standing rest break) Assistive device: Rolling walker (2 wheeled) Gait Pattern/deviations: Step-through pattern;Trunk flexed;Wide base of support Gait velocity: slower and guarded   General Gait Details: slow "weak"-kneed and guarded. Dyspneic at 2-3/4 and having trouble talking while walking   Stairs            Wheelchair Mobility    Modified Rankin (Stroke Patients Only)       Balance Overall balance assessment: Needs  assistance Sitting-balance support: Feet supported Sitting balance-Leahy Scale: Fair     Standing balance support: No upper extremity supported Standing balance-Leahy Scale: Poor Standing balance comment: needs RW                    Cognition Arousal/Alertness: Awake/alert Behavior During Therapy: WFL for tasks assessed/performed Overall Cognitive Status: No family/caregiver present to determine baseline cognitive functioning                      Exercises      General Comments General comments (skin integrity, edema, etc.): EHR stayed below 105 bpm and SpO2 at dropped toward 92 on 3 L with dyspnea so raised to 5L as set earlier and her sats rose to 98-100%      Pertinent Vitals/Pain Pain Assessment: No/denies pain    Home Living                      Prior Function            PT Goals (current goals can now be found in the care plan section) Acute Rehab PT Goals Patient Stated Goal: to feel better and go home PT Goal Formulation: With patient Time For Goal Achievement: 12/03/14 Potential to Achieve Goals: Good Progress towards PT goals: Progressing toward goals    Frequency  Min 3X/week    PT Plan Current plan remains appropriate    Co-evaluation             End of Session Equipment Utilized During Treatment: Oxygen Activity Tolerance: Patient limited by fatigue Patient left: in chair;with  call bell/phone within reach;with chair alarm set     Time: [phone] PT Time Calculation (min) (ACUTE ONLY): 22 min  Charges:  $Gait Training: 8-22 mins                    G Codes:      Kinsley Holderman, Eliseo Gum 11/21/2014, 4:18 PM 11/21/2014  [place], [place] [phone]  (pager)

## 2014-11-21 NOTE — Progress Notes (Signed)
I know this patient well from the cardiac viewpoint. I follow worsen outpatient. I agree with the assessment by Dr. Thedore Mins this a.m. Nothing I can add this morning.  Jerral Bonito, MD

## 2014-11-21 NOTE — Progress Notes (Signed)
Patient Demographics:    [name], is a 81 y.o. female, DOB - 03-Mar-1930, WUJ:[id number]  Admit date - 11/17/2014   Admitting Physician Rolly Salter, MD  Outpatient Primary MD for the patient is Kaleen Mask, MD  LOS - 3   Chief Complaint  Patient presents with  . Altered Mental Status  . Tachycardia        Subjective:    [name] today has, No headache, No chest pain, No abdominal pain - No Nausea, No new weakness tingling or numbness, mild dry cough with shortness of breath   Assessment  & Plan :    1.? Sepsis - unclear presentation with generalized weakness, leukocytosis, encephalopathy. Does have elevated liver enzymes, does have question of a pancreatic mass - question if she has mild cholangitis, no abdominal pain, Blood cultures 2/2 growing gram-negative rods. CT scan abdomen and pelvis and MRI abdomen both inconclusive. GI on board ERCP stable follow biopsy may need EUS, will defer to GI. Continue ABX. Likely bacteremia from UTI.    2. Elevated liver enzymes with questionable pancreatic mass on CT and MRI abdomen. Could have mild cholangitis as she had leukocytosis and encephalopathy on admission, anti-biotics as above, may need ERCP. Plan as above.   3. COPD. On home oxygen, currently at baseline, no wheezing on exam, but minimal movement, still complaining of some shortness of breath will give her a trial of IV Steroid, CXR non acute, Continue supportive care with oxygen nebulizer treatments as needed.   4. Atrial fibrillation chronic in RVR upon admission. [place] Vasc of greater than 3. Now in rate control have placed on Cardizem oral (was on 300 mg at home) , will try to titrate off IV Cardizem drip, Cards following, continue Toprol-XL. Not on anticoagulation due to  previous GI bleed.   5. CAD with history of chronic diastolic CHF last EF of 55% in 2014. Clinically compensated from CHF standpoint, chest pain-free, mild elevation of troponin not in ACS pattern. Continue aspirin, beta blocker and statin for secondary prevention. Stable TTE.   6.Hyponatremia. Was dehydrated as her oral intake prior to admission was poor, improved after hydration with IV fluids continue gentle IV fluids.   7. Hypokalemia. Replaced.   8. GERD. On PPI continue.   9.  Dyslipidemia. On home dose statin continue.    10. Encephalopathy due to #1 above. Much improved, continue supportive care, at risk for delirium. Minimize narcotics and benzodiazepine's.    11.UTI with pansensitive pseudomonas bacteremia and sepsis - on IV ceftaz appropriate anti-biotic on since 11/17/2014 (initially was on Zosyn), will require total of 14 days. Likely be switched to Cipro later.   12. DM type II. Hold Glucophage, on sliding scale monitor CBGs.  CBG (last 3)   Recent Labs  11/20/14 1539 11/20/14 2127 11/21/14 0806  GLUCAP 222* 189* 197*      Code Status : Full  Family Communication  : daughter in law over there phone, told prognosis guarded she understands, Grand daughter bedside  Disposition Plan  : To be decided  Consults  : GI, Cards  Procedures  :   CT Abd - ? Pancreatic mass/edema  MRI ABD - ? Pancreatic mass  ERCP done by Dr. Russella Dar on  11/20/2014. Showing CBD stenosis, brush cytology obtained. Prior evidence of Sphinterectomy.  TTE  - Left ventricle: The cavity size was normal. Wall thickness wasincreased in a pattern of mild LVH. Systolic function was normal.The estimated ejection fraction was in the range of 55% to 60%. Regional wall motion abnormalities cannot be excluded. - Mitral valve: There was mild regurgitation. - Left atrium: The atrium was moderately dilated. - Right atrium: The atrium was mildly dilated. - Pulmonary arteries: PA peak  pressure: 35 mm Hg (S).   DVT Prophylaxis  :   Heparin   Lab Results  Component Value Date   PLT 170 11/21/2014    Inpatient Medications  Scheduled Meds: . antiseptic oral rinse  7 mL Mouth Rinse BID  . aspirin EC  81 mg Oral Daily  . atorvastatin  20 mg Oral q1800  . budesonide-formoterol  2 puff Inhalation BID  . cefTAZidime (FORTAZ)  IV  2 g Intravenous Q12H  . diltiazem  90 mg Oral 3 times per day  . heparin  5,000 Units Subcutaneous 3 times per day  . insulin aspart  0-15 Units Subcutaneous TID WC  . insulin aspart  0-5 Units Subcutaneous QHS  . methylPREDNISolone (SOLU-MEDROL) injection  60 mg Intravenous 3 times per day  . metoprolol succinate  25 mg Oral Daily  . pantoprazole  40 mg Oral Daily  . sodium chloride  3 mL Intravenous Q12H  . tiotropium  1 capsule Inhalation Daily   Continuous Infusions: . sodium chloride 75 mL/hr at 11/21/14 0730  . diltiazem (CARDIZEM) infusion 5 mg/hr (11/20/14 1344)   PRN Meds:.acetaminophen **OR** acetaminophen, ALPRAZolam, diltiazem, ondansetron **OR** ondansetron (ZOFRAN) IV  Antibiotics  :    Anti-infectives    Start     Dose/Rate Route Frequency Ordered Stop   11/19/14 2200  cefTAZidime (FORTAZ) 2 g in dextrose 5 % 50 mL IVPB     2 g 100 mL/hr over 30 Minutes Intravenous Every 12 hours 11/19/14 1307     11/18/14 1800  vancomycin (VANCOCIN) IVPB 1000 mg/200 mL premix  Status:  Discontinued     1,000 mg 200 mL/hr over 60 Minutes Intravenous Every 24 hours 11/18/14 0313 11/19/14 0707   11/18/14 1400  metroNIDAZOLE (FLAGYL) IVPB 500 mg  Status:  Discontinued     500 mg 100 mL/hr over 60 Minutes Intravenous Every 8 hours 11/18/14 1320 11/20/14 1119   11/18/14 0330  cefTAZidime (FORTAZ) 1 g in dextrose 5 % 50 mL IVPB  Status:  Discontinued     1 g 100 mL/hr over 30 Minutes Intravenous Every 12 hours 11/18/14 0313 11/19/14 1307   11/18/14 0300  levofloxacin (LEVAQUIN) IVPB 750 mg     750 mg 100 mL/hr over 90 Minutes  Intravenous  Once 11/18/14 0246 11/18/14 0452   11/18/14 0300  vancomycin (VANCOCIN) IVPB 1000 mg/200 mL premix  Status:  Discontinued     1,000 mg 200 mL/hr over 60 Minutes Intravenous  Once 11/18/14 0246 11/18/14 0247   11/17/14 2345  imipenem-cilastatin (PRIMAXIN) 500 mg in sodium chloride 0.9 % 100 mL IVPB  Status:  Discontinued     500 mg 200 mL/hr over 30 Minutes Intravenous  Once 11/17/14 2338 11/18/14 0246   11/17/14 2315  piperacillin-tazobactam (ZOSYN) IVPB 3.375 g  Status:  Discontinued     3.375 g 100 mL/hr over 30 Minutes Intravenous  Once 11/17/14 2301 11/17/14 2302   11/17/14 2315  piperacillin-tazobactam (ZOSYN) IVPB 3.375 g  Status:  Discontinued  3.375 g 100 mL/hr over 30 Minutes Intravenous  Once 11/17/14 2302 11/17/14 2308   11/17/14 2315  vancomycin (VANCOCIN) 1,250 mg in sodium chloride 0.9 % 250 mL IVPB     1,250 mg 166.7 mL/hr over 90 Minutes Intravenous  Once 11/17/14 2305 11/18/14 0321        Objective:   Filed Vitals:   11/21/14 0337 11/21/14 0400 11/21/14 0500 11/21/14 0700  BP:  131/65    Pulse:  80    Temp: 98.5 F (36.9 C)   98.2 F (36.8 C)  TempSrc: Oral   Oral  Resp:      Height:      Weight:   89 kg (196 lb 3.4 oz)   SpO2:  98%      Wt Readings from Last 3 Encounters:  11/21/14 89 kg (196 lb 3.4 oz)  07/16/14 83.915 kg (185 lb)  05/08/14 86.8 kg (191 lb 5.8 oz)     Intake/Output Summary (Last 24 hours) at 11/21/14 0851 Last data filed at 11/21/14 0338  Gross per 24 hour  Intake 1587.17 ml  Output    800 ml  Net 787.17 ml     Physical Exam  Awake Alert, Oriented X 3, No new F.N deficits, Normal affect [place].AT,PERRAL Supple Neck,No JVD, No cervical lymphadenopathy appriciated.  Symmetrical Chest wall movement, moderate air movement bilaterally, CTAB RRR,No Gallops,Rubs or new Murmurs, No Parasternal Heave +ve B.Sounds, Abd Soft, No tenderness, No organomegaly appriciated, No rebound - guarding or rigidity. No Cyanosis,  Clubbing or edema, No new Rash or bruise       Data Review:   Micro Results Recent Results (from the past 240 hour(s))  Urine culture     Status: None   Collection Time: 11/17/14 11:20 PM  Result Value Ref Range Status   Specimen Description URINE, CATHETERIZED  Final   Special Requests NONE  Final   Culture >=100,000 COLONIES/mL PSEUDOMONAS AERUGINOSA  Final   Report Status 11/20/2014 FINAL  Final   Organism ID, Bacteria PSEUDOMONAS AERUGINOSA  Final      Susceptibility   Pseudomonas aeruginosa - MIC*    CEFTAZIDIME 8 SENSITIVE Sensitive     CIPROFLOXACIN <=0.25 SENSITIVE Sensitive     GENTAMICIN 2 SENSITIVE Sensitive     IMIPENEM 2 SENSITIVE Sensitive     PIP/TAZO 16 SENSITIVE Sensitive     CEFEPIME 4 SENSITIVE Sensitive     * >=100,000 COLONIES/mL PSEUDOMONAS AERUGINOSA  Blood culture (routine x 2)     Status: None (Preliminary result)   Collection Time: 11/18/14 12:37 AM  Result Value Ref Range Status   Specimen Description BLOOD RIGHT WRIST  Final   Special Requests BOTTLES DRAWN AEROBIC AND ANAEROBIC 5CC  Final   Culture  Setup Time   Final    GRAM NEGATIVE RODS AEROBIC BOTTLE ONLY CRITICAL RESULT CALLED TO, READ BACK BY AND VERIFIED WITH: L HITT,RN 07.24.16 2015 M SHIPMAN CONFIRMED BY M CAMPBELL    Culture PSEUDOMONAS AERUGINOSA  Final   Report Status PENDING  Incomplete  Blood culture (routine x 2)     Status: None (Preliminary result)   Collection Time: 11/18/14 12:38 AM  Result Value Ref Range Status   Specimen Description BLOOD LEFT ARM  Final   Special Requests BOTTLES DRAWN AEROBIC AND ANAEROBIC 5CC  Final   Culture  Setup Time   Final    GRAM NEGATIVE RODS AEROBIC BOTTLE ONLY CRITICAL RESULT CALLED TO, READ BACK BY AND VERIFIED WITH: HITT,L RN 11/18/14 2015  WOOTEN,K CONFIRMED BY K BARR    Culture PSEUDOMONAS AERUGINOSA  Final   Report Status PENDING  Incomplete  MRSA PCR Screening     Status: None   Collection Time: 11/18/14  4:29 AM  Result Value Ref  Range Status   MRSA by PCR NEGATIVE NEGATIVE Final    Comment:        The GeneXpert MRSA Assay (FDA approved for NASAL specimens only), is one component of a comprehensive MRSA colonization surveillance program. It is not intended to diagnose MRSA infection nor to guide or monitor treatment for MRSA infections.   Urine culture     Status: None   Collection Time: 11/18/14  3:10 PM  Result Value Ref Range Status   Specimen Description URINE, RANDOM  Final   Special Requests NONE  Final   Culture MULTIPLE SPECIES PRESENT, SUGGEST RECOLLECTION  Final   Report Status 11/20/2014 FINAL  Final    Radiology Reports Dg Chest 2 View  10/28/2014   CLINICAL DATA:  Shortness of breath  EXAM: CHEST  2 VIEW  COMPARISON:  07/16/2014  FINDINGS: There is hyperinflation of the lungs compatible with COPD. Prior CABG. Cardiomegaly. Lungs are clear. No effusions. No acute bony abnormality.  IMPRESSION: COPD, cardiomegaly.  No active disease.   Electronically Signed   By: Charlett Nose M.D.   On: 10/28/2014 10:23   Mr Abdomen Wo Contrast  11/18/2014   CLINICAL DATA:  Elevated liver function tests. Remote history of breast cancer. Question of duodenal or pancreatic mass on prior exam.  EXAM: MRI ABDOMEN WITHOUT CONTRAST  TECHNIQUE: Multiplanar multisequence MR imaging was performed without the administration of intravenous contrast.  COMPARISON:  CT abdomen/pelvis 11/18/2014 and 05/04/2014, chest CT with incomplete upper abdominal imaging 09/25/2010  FINDINGS: Contrast was not administered because the patient was unable to remain still and requested that the examination be terminated. There is significant motion artifact on multiple imaging sequences.  There is mild central intrahepatic ductal dilatation and mild periportal edema. Several too small to characterize hepatic T2 hyperintense lesions are most likely cysts. There is trace porta hepatis stranding/fluid but no definable collection is identified. Again noted  is suggestion of irregular soft tissue at the porta hepatis just inferior to the portal vein but this cannot be measured with accuracy due to motion artifact on the current exam.  The common duct is normal in caliber and no filling defect is identified.  The pancreas is unremarkable without pancreatic ductal dilatation.  Adrenal glands are normal. Probable subcentimeter left renal cortical cyst. No hydronephrosis.  No measurable lymphadenopathy or aortic aneurysm. Artifact from sternal wires partly visualized.  IMPRESSION: Minimal periportal edema and central mild intrahepatic ductal dilatation. No hepatic mass is identified. Evaluation is limited by patient motion and lack of ability to administer contrast. There is a persisting question of soft tissue mass within the porta hepatis inferior to the portal vein which could be a lymph node or mass producing minimal central obstruction. Other etiologies could include stricture, occult stone, or bile duct mass not visualized. Groove pancreatitis is felt less likely because the patient reportedly has normal pancreatic enzymes at recent lab values.  No gross evidence for pancreatic mass.  Findings discussed in exam reviewed with Dr. Juanda Chance by Dr. Chilton Si on 11/18/2014 at 12:40 p.m.   Electronically Signed   By: Christiana Pellant M.D.   On: 11/18/2014 12:46   US Abdomen Complete  10/28/2014   CLINICAL DATA:  Nausea, hypertension, hyperlipidemia, COPD, smoking, coronary artery  disease post CABG, atrial fibrillation, CHF  EXAM: ULTRASOUND ABDOMEN COMPLETE  COMPARISON:  CT abdomen and pelvis 05/04/2014  FINDINGS: Gallbladder: Surgically absent  Common bile duct: Diameter: 5 mm diameter common normal  Liver: Mildly heterogeneous echogenicity without focal mass. Hepatopetal portal venous flow.  IVC: Normal appearance  Pancreas: Normal appearance  Spleen: Normal appearance, 11.4 cm length  Right Kidney: Length: 11.9 cm. Cortical thinning. Normal cortical echogenicity. No mass  hydronephrosis.  Left Kidney: Length: 9.9 cm. Normal cortical echogenicity. Cortical thinning. No mass or hydronephrosis.  Abdominal aorta: Atherosclerotic changes without aneurysm.  Other findings: No free fluid  IMPRESSION: Post cholecystectomy.  Heterogeneous hepatic echogenicity without discrete mass.  BILATERAL renal cortical atrophy.  Otherwise negative exam.   Electronically Signed   By: Ulyses Southward M.D.   On: 10/28/2014 15:11   Ct Abdomen Pelvis W Contrast  11/18/2014   CLINICAL DATA:  81 year old female with fever and abdominal pain.  EXAM: CT ABDOMEN AND PELVIS WITH CONTRAST  TECHNIQUE: Multidetector CT imaging of the abdomen and pelvis was performed using the standard protocol following bolus administration of intravenous contrast.  CONTRAST:  80mL OMNIPAQUE IOHEXOL 300 MG/ML  SOLN  COMPARISON:  CT dated 05/04/2014 and ultrasound dated 10/28/2014  FINDINGS: Evaluation is limited due to streak artifact caused by patient's arms and metallic left hip arthroplasty.  Emphysematous changes of the lung bases. There is coronary vascular calcification. No intra-abdominal free air or free fluid.  There is mild hepatic surface irregularity. A 1.6 x 1.0 cm hypodense lesion in the inferior right lobe of the liver (series 201 image 37) is not well characterized. MRI may provide better evaluation. Cholecystectomy. The pancreas is atrophic. There is an ill-defined soft tissue prominence involving the head of the pancreas at the ampulla of Vater. This area is not well evaluated on the CT. The the capillary mass or pancreatic lesion is not excluded. Endoscopic ultrasound is recommended for better evaluation. The spleen, adrenal glands appear unremarkable. There is moderate bilateral renal atrophy. There is no hydronephrosis. The visualized ureters and urinary bladder appear unremarkable. Hysterectomy.  Extensive sigmoid diverticulosis with muscular hypertrophy. No definite active inflammatory changes. Moderate stool in  the distal colon. No evidence of bowel obstruction. There is a 1.6 cm duodenal diverticulum. There is soft tissue density along the medial wall of the second portion of the duodenum (series 201, image 35). Normal appendix.  There is aortoiliac atherosclerotic disease. A 3.2 cm infrarenal abdominal aortic aneurysm. The origins of the celiac axis, SMA, IMA as well as the origins of the renal arteries appear patent. No portal venous gas identified. Ill-defined nodular soft tissue density in the region of the porta hepatis likely represent lymph nodes. There is diastases of anterior abdominal wall musculature in the midline with a small fat containing umbilical hernia. Degenerative changes of the spine. Left hip arthroplasty. No acute fracture.  IMPRESSION: Ill-defined soft tissue thickness involving the head and uncinate process of the pancreas as well as the medial wall of the second portion of the duodenum. MRI or endoscopic ultrasound is recommended for further evaluation.  Sigmoid diverticulosis. No evidence of bowel obstruction or inflammation. Normal appendix.  Incompletely characterized hypodense lesion along the inferior surface of the right lobe of the liver. MRI may provide better characterization.   Electronically Signed   By: Elgie Collard M.D.   On: 11/18/2014 01:03   Dg Chest Port 1 View  11/19/2014   CLINICAL DATA:  Shortness of breath, history of COPD on home  oxygen, history of CHF and CABG, previous tobacco use.  EXAM: PORTABLE CHEST - 1 VIEW  COMPARISON:  Portable chest x-ray of November 17, 2014  FINDINGS: The lungs remain hyperinflated. There is no focal infiltrate. The cardiac silhouette remains enlarged. The pulmonary vascularity is prominent centrally. There is no pulmonary interstitial edema. There are post CABG changes. There is no pleural effusion. The bony thorax exhibits no acute abnormality.  IMPRESSION: COPD with low-grade compensated CHF. There is no pulmonary edema or pneumonia.    Electronically Signed   By: David  [place] M.D.   On: 11/19/2014 07:59   Dg Chest Portable 1 View  11/17/2014   CLINICAL DATA:  81 year old female with shortness of breath and tachycardia.  EXAM: PORTABLE CHEST - 1 VIEW  COMPARISON:  Radiograph dated 10/28/2014  FINDINGS: Single-view of the chest demonstrate emphysematous changes of the lungs. There is no focal consolidation, pleural effusion, or pneumothorax. The cardiomediastinal silhouette is within normal limits. Degenerative changes of the osseous structures.  IMPRESSION: Emphysema.  No focal consolidation or pneumothorax.   Electronically Signed   By: Elgie Collard M.D.   On: 11/17/2014 22:50   Dg Ercp Biliary & Pancreatic Ducts  11/20/2014   CLINICAL DATA:  81 year old female with choledocholithiasis  EXAM: ERCP  TECHNIQUE: Multiple spot images obtained with the fluoroscopic device and submitted for interpretation post-procedure.  FLUOROSCOPY TIME:  Please see operative report for further detail.  COMPARISON:  MRCP 11/18/2014  FINDINGS: A total of 8 intraoperative spot films demonstrate a flexible endoscope in the descending duodenum followed by wire cannulation of the common bile duct and cholangiogram. Cholangiogram demonstrates several rounded filling defects in the distal common bile duct consistent with choledocholithiasis. The images demonstrate balloon sweep of the common bile duct. On the final image, no further filling defects are identified.  IMPRESSION: Choledocholithiasis with sphincterotomy and balloon sweep of the common bile duct.  These images were submitted for radiologic interpretation only. Please see the procedural report for the amount of contrast and the fluoroscopy time utilized.   Electronically Signed   By: Malachy Moan M.D.   On: 11/20/2014 09:42     CBC  Recent Labs Lab 11/17/14 2222 11/18/14 0321 11/19/14 0235 11/20/14 0259 11/21/14 0246  WBC 17.9* 18.2* 9.4 6.4 8.4  HGB 11.9* 9.9* 9.5* 9.0* 9.2*  HCT  36.2 30.3* 29.9* 27.3* 27.5*  PLT 192 155 136* 138* 170  MCV 87.7 87.8 89.0 87.8 88.1  MCH 28.8 28.7 28.3 28.9 29.5  MCHC 32.9 32.7 31.8 33.0 33.5  RDW 13.8 14.0 14.1 14.0 14.0  LYMPHSABS 0.2* 0.2*  --   --   --   MONOABS 0.8 1.1*  --   --   --   EOSABS 0.0 0.0  --   --   --   BASOSABS 0.0 0.0  --   --   --     Chemistries   Recent Labs Lab 11/17/14 2222 11/18/14 0321 11/19/14 0235 11/20/14 0259 11/21/14 0246  NA 133* 131* 133* 131* 131*  K 3.4* 3.4* 3.9 4.4 4.3  CL 87* 89* 95* 97* 98*  CO2 33* 32 28 27 26   GLUCOSE 189* 177* 117* 163* 182*  BUN 12 13 18 18  28*  CREATININE 1.22* 1.26* 1.26* 1.04* 1.22*  CALCIUM 10.3 9.2 9.2 9.0 9.4  MG  --   --  1.6* 2.4  --   AST 353* 223* 87* 34 23  ALT 415* 300* 198* 137* 105*  ALKPHOS 301* 230* 199*  178* 158*  BILITOT 5.0* 4.1* 2.8* 1.6* 1.2   ------------------------------------------------------------------------------------------------------------------ estimated creatinine clearance is 43.8 mL/min (by C-G formula based on Cr of 1.22). ------------------------------------------------------------------------------------------------------------------ No results for input(s): HGBA1C in the last 72 hours. ------------------------------------------------------------------------------------------------------------------ No results for input(s): CHOL, HDL, LDLCALC, TRIG, CHOLHDL, LDLDIRECT in the last 72 hours. ------------------------------------------------------------------------------------------------------------------ No results for input(s): TSH, T4TOTAL, T3FREE, THYROIDAB in the last 72 hours.  Invalid input(s): FREET3 ------------------------------------------------------------------------------------------------------------------ No results for input(s): VITAMINB12, FOLATE, FERRITIN, TIBC, IRON, RETICCTPCT in the last 72 hours.  Coagulation profile  Recent Labs Lab 11/17/14 2222 11/18/14 0321  INR 1.13 1.32    No  results for input(s): DDIMER in the last 72 hours.  Cardiac Enzymes  Recent Labs Lab 11/18/14 0321 11/18/14 0845 11/18/14 1536  TROPONINI 0.12* 0.04* 0.04*   ------------------------------------------------------------------------------------------------------------------ Invalid input(s): POCBNP   Time Spent in minutes  35   Azelea Seguin K M.D on 11/21/2014 at 8:51 AM  Between 7am to 7pm - Pager - [phone]  After 7pm go to [email or web address] - password [facility]  [facility] -  Office  [phone]

## 2014-11-22 ENCOUNTER — Other Ambulatory Visit (HOSPITAL_COMMUNITY): Payer: Medicare Other

## 2014-11-22 DIAGNOSIS — A4152 Sepsis due to Pseudomonas: Principal | ICD-10-CM

## 2014-11-22 LAB — COMPREHENSIVE METABOLIC PANEL
ALBUMIN: 2.8 g/dL — AB (ref 3.5–5.0)
ALK PHOS: 146 U/L — AB (ref 38–126)
ALT: 82 U/L — ABNORMAL HIGH (ref 14–54)
AST: 20 U/L (ref 15–41)
Anion gap: 7 (ref 5–15)
BILIRUBIN TOTAL: 1.3 mg/dL — AB (ref 0.3–1.2)
BUN: 30 mg/dL — ABNORMAL HIGH (ref 6–20)
CO2: 25 mmol/L (ref 22–32)
CREATININE: 1.16 mg/dL — AB (ref 0.44–1.00)
Calcium: 9.6 mg/dL (ref 8.9–10.3)
Chloride: 102 mmol/L (ref 101–111)
GFR calc Af Amer: 49 mL/min — ABNORMAL LOW (ref >60)
GFR calc non Af Amer: 42 mL/min — ABNORMAL LOW (ref >60)
Glucose, Bld: 246 mg/dL — ABNORMAL HIGH (ref 65–99)
Potassium: 4.3 mmol/L (ref 3.5–5.1)
SODIUM: 134 mmol/L — AB (ref 135–145)
TOTAL PROTEIN: 5.5 g/dL — AB (ref 6.5–8.1)

## 2014-11-22 LAB — GLUCOSE, CAPILLARY
GLUCOSE-CAPILLARY: 237 mg/dL — AB (ref 65–99)
Glucose-Capillary: 238 mg/dL — ABNORMAL HIGH (ref 65–99)
Glucose-Capillary: 272 mg/dL — ABNORMAL HIGH (ref 65–99)

## 2014-11-22 MED ORDER — ALPRAZOLAM 0.5 MG PO TABS
0.5000 mg | ORAL_TABLET | Freq: Once | ORAL | Status: AC
  Administered 2014-11-22: 0.5 mg via ORAL
  Filled 2014-11-22: qty 1

## 2014-11-22 MED ORDER — INSULIN GLARGINE 100 UNIT/ML ~~LOC~~ SOLN
20.0000 [IU] | Freq: Every day | SUBCUTANEOUS | Status: DC
  Administered 2014-11-22 – 2014-11-23 (×2): 20 [IU] via SUBCUTANEOUS
  Filled 2014-11-22 (×3): qty 0.2

## 2014-11-22 MED ORDER — ALBUTEROL SULFATE (2.5 MG/3ML) 0.083% IN NEBU
2.5000 mg | INHALATION_SOLUTION | Freq: Four times a day (QID) | RESPIRATORY_TRACT | Status: DC
  Administered 2014-11-22 – 2014-11-23 (×5): 2.5 mg via RESPIRATORY_TRACT
  Filled 2014-11-22 (×5): qty 3

## 2014-11-22 NOTE — Care Management Important Message (Signed)
Important Message  Patient Details  Name: CATHARINE KETTLEWELL MRN: [id number] Date of Birth: 1930/01/30   Medicare Important Message Given:  Yes-third notification given    Kyla Balzarine 11/22/2014, 1:04 PMImportant Message  Patient Details  Name: DEREKA LUERAS MRN: [id number] Date of Birth: 1929-11-03   Medicare Important Message Given:  Yes-third notification given    Kyla Balzarine 11/22/2014, 1:04 PM

## 2014-11-22 NOTE — Progress Notes (Signed)
ANTIBIOTIC CONSULT NOTE - FOLLOW UP  Pharmacy Consult for Ceftazidime Indication: Bacteremia / Urosepsis  Allergies  Allergen Reactions  . Other Other (See Comments)    TETANUS-can only take 1/2 dose at one time, can take the other 1/2 dose about 3 days later  . Codeine Nausea And Vomiting  . Sulfonamide Derivatives Nausea And Vomiting  . Warfarin     H/o AVM's prechief complaintcluding anticoagulation  . Penicillins Itching, Rash and Other (See Comments)    Nasal itching   . Ranitidine Hcl Itching and Rash    Patient Measurements: Height: 6' (182.9 cm) Weight: 198 lb 10.2 oz (90.1 kg) IBW/kg (Calculated) : 73.1  Vital Signs: Temp: 97.5 F (36.4 C) (07/28 0518) Temp Source: Oral (07/28 0518) BP: 130/76 mmHg (07/28 0518) Pulse Rate: 61 (07/28 0518) Intake/Output from previous day: 07/27 0701 - 07/28 0700 In: 785 [P.O.:720; I.V.:15; IV Piggyback:50] Out: 850 [Urine:850] Intake/Output from this shift: Total I/O In: 200 [P.O.:200] Out: -   Labs:  Recent Labs  11/20/14 0259 11/21/14 0246 11/22/14 0533  WBC 6.4 8.4  --   HGB 9.0* 9.2*  --   PLT 138* 170  --   CREATININE 1.04* 1.22* 1.16*   Estimated Creatinine Clearance: 46.4 mL/min (by C-G formula based on Cr of 1.16). No results for input(s): VANCOTROUGH, VANCOPEAK, VANCORANDOM, GENTTROUGH, GENTPEAK, GENTRANDOM, TOBRATROUGH, TOBRAPEAK, TOBRARND, AMIKACINPEAK, AMIKACINTROU, AMIKACIN in the last 72 hours.   Microbiology: Recent Results (from the past 720 hour(s))  Urine culture     Status: None   Collection Time: 10/28/14 10:59 AM  Result Value Ref Range Status   Specimen Description URINE, CLEAN CATCH  Final   Special Requests Normal  Final   Culture   Final    MULTIPLE SPECIES PRESENT, SUGGEST RECOLLECTION IF CLINICALLY INDICATED   Report Status 10/29/2014 FINAL  Final  Urine culture     Status: None   Collection Time: 11/17/14 11:20 PM  Result Value Ref Range Status   Specimen Description URINE,  CATHETERIZED  Final   Special Requests NONE  Final   Culture >=100,000 COLONIES/mL PSEUDOMONAS AERUGINOSA  Final   Report Status 11/20/2014 FINAL  Final   Organism ID, Bacteria PSEUDOMONAS AERUGINOSA  Final      Susceptibility   Pseudomonas aeruginosa - MIC*    CEFTAZIDIME 8 SENSITIVE Sensitive     CIPROFLOXACIN <=0.25 SENSITIVE Sensitive     GENTAMICIN 2 SENSITIVE Sensitive     IMIPENEM 2 SENSITIVE Sensitive     PIP/TAZO 16 SENSITIVE Sensitive     CEFEPIME 4 SENSITIVE Sensitive     * >=100,000 COLONIES/mL PSEUDOMONAS AERUGINOSA  Blood culture (routine x 2)     Status: None   Collection Time: 11/18/14 12:37 AM  Result Value Ref Range Status   Specimen Description BLOOD RIGHT WRIST  Final   Special Requests BOTTLES DRAWN AEROBIC AND ANAEROBIC 5CC  Final   Culture  Setup Time   Final    GRAM NEGATIVE RODS AEROBIC BOTTLE ONLY CRITICAL RESULT CALLED TO, READ BACK BY AND VERIFIED WITH: L HITT,RN 07.24.16 2015 M SHIPMAN CONFIRMED BY M CAMPBELL    Culture   Final    PSEUDOMONAS AERUGINOSA SUSCEPTIBILITIES PERFORMED ON PREVIOUS CULTURE WITHIN THE LAST 5 DAYS.    Report Status 11/21/2014 FINAL  Final  Blood culture (routine x 2)     Status: None   Collection Time: 11/18/14 12:38 AM  Result Value Ref Range Status   Specimen Description BLOOD LEFT ARM  Final   Special  Requests BOTTLES DRAWN AEROBIC AND ANAEROBIC 5CC  Final   Culture  Setup Time   Final    GRAM NEGATIVE RODS AEROBIC BOTTLE ONLY CRITICAL RESULT CALLED TO, READ BACK BY AND VERIFIED WITH: HITT,L RN 11/18/14 2015 WOOTEN,K CONFIRMED BY K BARR    Culture PSEUDOMONAS AERUGINOSA  Final   Report Status 11/21/2014 FINAL  Final   Organism ID, Bacteria PSEUDOMONAS AERUGINOSA  Final      Susceptibility   Pseudomonas aeruginosa - MIC*    CEFTAZIDIME 4 SENSITIVE Sensitive     CIPROFLOXACIN <=0.25 SENSITIVE Sensitive     GENTAMICIN 2 SENSITIVE Sensitive     IMIPENEM 0.5 SENSITIVE Sensitive     PIP/TAZO 8 SENSITIVE Sensitive      CEFEPIME 2 SENSITIVE Sensitive     * PSEUDOMONAS AERUGINOSA  MRSA PCR Screening     Status: None   Collection Time: 11/18/14  4:29 AM  Result Value Ref Range Status   MRSA by PCR NEGATIVE NEGATIVE Final    Comment:        The GeneXpert MRSA Assay (FDA approved for NASAL specimens only), is one component of a comprehensive MRSA colonization surveillance program. It is not intended to diagnose MRSA infection nor to guide or monitor treatment for MRSA infections.   Urine culture     Status: None   Collection Time: 11/18/14  3:10 PM  Result Value Ref Range Status   Specimen Description URINE, RANDOM  Final   Special Requests NONE  Final   Culture MULTIPLE SPECIES PRESENT, SUGGEST RECOLLECTION  Final   Report Status 11/20/2014 FINAL  Final  Culture, blood (routine x 2)     Status: None (Preliminary result)   Collection Time: 11/21/14  1:05 PM  Result Value Ref Range Status   Specimen Description BLOOD RIGHT ANTECUBITAL  Final   Special Requests BOTTLES DRAWN AEROBIC AND ANAEROBIC 10CC  Final   Culture PENDING  Incomplete   Report Status PENDING  Incomplete  Culture, blood (routine x 2)     Status: None (Preliminary result)   Collection Time: 11/21/14  1:11 PM  Result Value Ref Range Status   Specimen Description BLOOD RIGHT ARM  Final   Special Requests BOTTLES DRAWN AEROBIC AND ANAEROBIC 5CC  Final   Culture PENDING  Incomplete   Report Status PENDING  Incomplete    Anti-infectives    Start     Dose/Rate Route Frequency Ordered Stop   11/19/14 2200  cefTAZidime (FORTAZ) 2 g in dextrose 5 % 50 mL IVPB     2 g 100 mL/hr over 30 Minutes Intravenous Every 12 hours 11/19/14 1307     11/18/14 1800  vancomycin (VANCOCIN) IVPB 1000 mg/200 mL premix  Status:  Discontinued     1,000 mg 200 mL/hr over 60 Minutes Intravenous Every 24 hours 11/18/14 0313 11/19/14 0707   11/18/14 1400  metroNIDAZOLE (FLAGYL) IVPB 500 mg  Status:  Discontinued     500 mg 100 mL/hr over 60 Minutes  Intravenous Every 8 hours 11/18/14 1320 11/20/14 1119   11/18/14 0330  cefTAZidime (FORTAZ) 1 g in dextrose 5 % 50 mL IVPB  Status:  Discontinued     1 g 100 mL/hr over 30 Minutes Intravenous Every 12 hours 11/18/14 0313 11/19/14 1307   11/18/14 0300  levofloxacin (LEVAQUIN) IVPB 750 mg     750 mg 100 mL/hr over 90 Minutes Intravenous  Once 11/18/14 0246 11/18/14 0452   11/18/14 0300  vancomycin (VANCOCIN) IVPB 1000 mg/200 mL premix  Status:  Discontinued     1,000 mg 200 mL/hr over 60 Minutes Intravenous  Once 11/18/14 0246 11/18/14 0247   11/17/14 2345  imipenem-cilastatin (PRIMAXIN) 500 mg in sodium chloride 0.9 % 100 mL IVPB  Status:  Discontinued     500 mg 200 mL/hr over 30 Minutes Intravenous  Once 11/17/14 2338 11/18/14 0246   11/17/14 2315  piperacillin-tazobactam (ZOSYN) IVPB 3.375 g  Status:  Discontinued     3.375 g 100 mL/hr over 30 Minutes Intravenous  Once 11/17/14 2301 11/17/14 2302   11/17/14 2315  piperacillin-tazobactam (ZOSYN) IVPB 3.375 g  Status:  Discontinued     3.375 g 100 mL/hr over 30 Minutes Intravenous  Once 11/17/14 2302 11/17/14 2308   11/17/14 2315  vancomycin (VANCOCIN) 1,250 mg in sodium chloride 0.9 % 250 mL IVPB     1,250 mg 166.7 mL/hr over 90 Minutes Intravenous  Once 11/17/14 2305 11/18/14 0321      Assessment: 81 y.o. female with AMS/SOB/tachycardia. Day #5 of abx for bacteremia and urosepsis. 7/24 Blood cx's are 2/2 for pseudomonas and 7/23 Urine > 100K pseudomonas. Currently afebrile and WBC wnl. SCr 1.16, CrCl ~61ml/min.  Repeat BCx 7/27 pending  Goal of Therapy:  Resolution of infection Appropriate adjustment of abx for renal fxn   Plan:  Continue ceftazidime 2g IV Q12 Monitor clinical picture, renal function F/U repeat C&S  Remi Haggard, PharmD Clinical Pharmacist- Resident Pager: [phone]  11/22/2014,11:39 AM

## 2014-11-22 NOTE — Clinical Social Work Note (Signed)
CSW provided handoff to the unit CSW. This CSW will sign off.   Gicela Schwarting, MSW, LCSWA [phone] 

## 2014-11-22 NOTE — Progress Notes (Addendum)
Patient Demographics:    [name], is a 81 y.o. female, DOB - 1929/07/18, WUJ:[id number]  Admit date - 11/17/2014   Admitting Physician Rolly Salter, MD  Outpatient Primary MD for the patient is Kaleen Mask, MD  LOS - 4   Chief Complaint  Patient presents with  . Altered Mental Status  . Tachycardia        Subjective:    [name] today has, No headache, No chest pain, No abdominal pain - No Nausea, No new weakness tingling or numbness, mild dry cough with shortness of breath, overall better.   Assessment  & Plan :    1. Sepsis with Pseudomonas bacteremia due to underlying UTI with generalized weakness, leukocytosis, encephalopathy. Does have elevated liver enzymes, does have question of a pancreatic mass - question if she has mild cholangitis as well due to a passed CBD stone, no abdominal pain. CT scan abdomen and pelvis and MRI abdomen both inconclusive. GI on board ERCP stable follow biopsy may need EUS, will defer to GI. Continue IV Elita Quick for now.  Repeat surveillance cultures drawn on 11/21/2014 negative so far.    2. Elevated liver enzymes with questionable pancreatic mass on CT and MRI abdomen. Could have mild cholangitis as she had leukocytosis and encephalopathy on admission, anti-biotics as above, may need ERCP. Plan as above.   3. COPD. On home oxygen, currently at baseline, no wheezing on exam, but minimal movement, still complaining of some shortness of breath will give her a trial of IV Steroid, CXR non acute, Continue supportive care with oxygen nebulizer treatments as needed.   4. Atrial fibrillation chronic in RVR upon admission. [place] Vasc of greater than 3. Now in rate control have placed on PO Cardizem oral (was on 300 mg at home)  continue Toprol-XL. Not on  anticoagulation due to previous GI bleed.   5. CAD with history of chronic diastolic CHF last EF of 55% in 2014. Clinically compensated from CHF standpoint, chest pain-free, mild elevation of troponin not in ACS pattern. Continue aspirin, beta blocker and statin for secondary prevention. Stable TTE.   6.Hyponatremia. Was dehydrated as her oral intake prior to admission was poor, improved after hydration with IV fluids continue gentle IV fluids.   7. Hypokalemia. Replaced.   8. GERD. On PPI continue.   9.  Dyslipidemia. On home dose statin continue.    10. Encephalopathy due to #1 above. Much improved, continue supportive care, at risk for delirium. Minimize narcotics and benzodiazepine's.    11.UTI with pansensitive pseudomonas bacteremia and sepsis - on IV ceftaz appropriate anti-biotic on since 11/17/2014 (initially was on Zosyn), will require total of 14 days. Likely be switched to Cipro later.   12. DM type II. Hold Glucophage, add low dose Lantus + on sliding scale monitor CBGs.  CBG (last 3)   Recent Labs  11/21/14 1710 11/21/14 2158 11/22/14 0809  GLUCAP 129* 216* 238*      Code Status : Full  Family Communication  : daughter in law over there phone, told prognosis guarded she understands, Grand daughter bedside  Disposition Plan  : SNF in am  Consults  : GI, Cards  Procedures  :   CT Abd - ? Pancreatic mass/edema  MRI ABD - ? Pancreatic mass  ERCP done by Dr. Russella Dar on 11/20/2014. Showing CBD stenosis, brush cytology obtained. Prior evidence of Sphinterectomy.  TTE  - Left ventricle: The cavity size was normal. Wall thickness wasincreased in a pattern of mild LVH. Systolic function was normal.The estimated ejection fraction was in the range of 55% to 60%. Regional wall motion abnormalities cannot be excluded. - Mitral valve: There was mild regurgitation. - Left atrium: The atrium was moderately dilated. - Right atrium: The atrium was mildly  dilated. - Pulmonary arteries: PA peak pressure: 35 mm Hg (S).   DVT Prophylaxis  :   Heparin   Lab Results  Component Value Date   PLT 170 11/21/2014    Inpatient Medications  Scheduled Meds: . albuterol  2.5 mg Nebulization Q6H  . antiseptic oral rinse  7 mL Mouth Rinse BID  . aspirin EC  81 mg Oral Daily  . atorvastatin  20 mg Oral q1800  . budesonide-formoterol  2 puff Inhalation BID  . cefTAZidime (FORTAZ)  IV  2 g Intravenous Q12H  . diltiazem  120 mg Oral 3 times per day  . heparin  5,000 Units Subcutaneous 3 times per day  . insulin aspart  0-15 Units Subcutaneous TID WC  . insulin aspart  0-5 Units Subcutaneous QHS  . methylPREDNISolone (SOLU-MEDROL) injection  60 mg Intravenous 3 times per day  . metoprolol succinate  25 mg Oral Daily  . pantoprazole  40 mg Oral Daily  . sodium chloride  3 mL Intravenous Q12H  . tiotropium  1 capsule Inhalation Daily   Continuous Infusions:   PRN Meds:.acetaminophen **OR** acetaminophen, ALPRAZolam, diltiazem, docusate sodium, ondansetron **OR** ondansetron (ZOFRAN) IV  Antibiotics  :    Anti-infectives    Start     Dose/Rate Route Frequency Ordered Stop   11/19/14 2200  cefTAZidime (FORTAZ) 2 g in dextrose 5 % 50 mL IVPB     2 g 100 mL/hr over 30 Minutes Intravenous Every 12 hours 11/19/14 1307     11/18/14 1800  vancomycin (VANCOCIN) IVPB 1000 mg/200 mL premix  Status:  Discontinued     1,000 mg 200 mL/hr over 60 Minutes Intravenous Every 24 hours 11/18/14 0313 11/19/14 0707   11/18/14 1400  metroNIDAZOLE (FLAGYL) IVPB 500 mg  Status:  Discontinued     500 mg 100 mL/hr over 60 Minutes Intravenous Every 8 hours 11/18/14 1320 11/20/14 1119   11/18/14 0330  cefTAZidime (FORTAZ) 1 g in dextrose 5 % 50 mL IVPB  Status:  Discontinued     1 g 100 mL/hr over 30 Minutes Intravenous Every 12 hours 11/18/14 0313 11/19/14 1307   11/18/14 0300  levofloxacin (LEVAQUIN) IVPB 750 mg     750 mg 100 mL/hr over 90 Minutes Intravenous   Once 11/18/14 0246 11/18/14 0452   11/18/14 0300  vancomycin (VANCOCIN) IVPB 1000 mg/200 mL premix  Status:  Discontinued     1,000 mg 200 mL/hr over 60 Minutes Intravenous  Once 11/18/14 0246 11/18/14 0247   11/17/14 2345  imipenem-cilastatin (PRIMAXIN) 500 mg in sodium chloride 0.9 % 100 mL IVPB  Status:  Discontinued     500 mg 200 mL/hr over 30 Minutes Intravenous  Once 11/17/14 2338 11/18/14 0246   11/17/14 2315  piperacillin-tazobactam (ZOSYN) IVPB 3.375 g  Status:  Discontinued     3.375 g 100 mL/hr over 30 Minutes Intravenous  Once 11/17/14 2301 11/17/14 2302   11/17/14 2315  piperacillin-tazobactam (ZOSYN) IVPB 3.375 g  Status:  Discontinued     3.375 g 100 mL/hr over 30 Minutes Intravenous  Once 11/17/14 2302 11/17/14 2308   11/17/14 2315  vancomycin (VANCOCIN) 1,250 mg in sodium chloride 0.9 % 250 mL IVPB     1,250 mg 166.7 mL/hr over 90 Minutes Intravenous  Once 11/17/14 2305 11/18/14 0321        Objective:   Filed Vitals:   11/21/14 2036 11/21/14 2130 11/22/14 0518 11/22/14 0805  BP:  144/62 130/76   Pulse:  90 61   Temp:  97.7 F (36.5 C) 97.5 F (36.4 C)   TempSrc:  Oral Oral   Resp:  20 20   Height:      Weight:   90.1 kg (198 lb 10.2 oz)   SpO2: 96% 100% 98% 96%    Wt Readings from Last 3 Encounters:  11/22/14 90.1 kg (198 lb 10.2 oz)  07/16/14 83.915 kg (185 lb)  05/08/14 86.8 kg (191 lb 5.8 oz)     Intake/Output Summary (Last 24 hours) at 11/22/14 1131 Last data filed at 11/22/14 0935  Gross per 24 hour  Intake    680 ml  Output    850 ml  Net   -170 ml     Physical Exam  Awake Alert, Oriented X 3, No new F.N deficits, Normal affect Richards.AT,PERRAL Supple Neck,No JVD, No cervical lymphadenopathy appriciated.  Symmetrical Chest wall movement, moderate air movement bilaterally, CTAB RRR,No Gallops,Rubs or new Murmurs, No Parasternal Heave +ve B.Sounds, Abd Soft, No tenderness, No organomegaly appriciated, No rebound - guarding or rigidity. No  Cyanosis, Clubbing or edema, No new Rash or bruise       Data Review:   Micro Results Recent Results (from the past 240 hour(s))  Urine culture     Status: None   Collection Time: 11/17/14 11:20 PM  Result Value Ref Range Status   Specimen Description URINE, CATHETERIZED  Final   Special Requests NONE  Final   Culture >=100,000 COLONIES/mL PSEUDOMONAS AERUGINOSA  Final   Report Status 11/20/2014 FINAL  Final   Organism ID, Bacteria PSEUDOMONAS AERUGINOSA  Final      Susceptibility   Pseudomonas aeruginosa - MIC*    CEFTAZIDIME 8 SENSITIVE Sensitive     CIPROFLOXACIN <=0.25 SENSITIVE Sensitive     GENTAMICIN 2 SENSITIVE Sensitive     IMIPENEM 2 SENSITIVE Sensitive     PIP/TAZO 16 SENSITIVE Sensitive     CEFEPIME 4 SENSITIVE Sensitive     * >=100,000 COLONIES/mL PSEUDOMONAS AERUGINOSA  Blood culture (routine x 2)     Status: None   Collection Time: 11/18/14 12:37 AM  Result Value Ref Range Status   Specimen Description BLOOD RIGHT WRIST  Final   Special Requests BOTTLES DRAWN AEROBIC AND ANAEROBIC 5CC  Final   Culture  Setup Time   Final    GRAM NEGATIVE RODS AEROBIC BOTTLE ONLY CRITICAL RESULT CALLED TO, READ BACK BY AND VERIFIED WITH: L HITT,RN 07.24.16 2015 M SHIPMAN CONFIRMED BY M CAMPBELL    Culture   Final    PSEUDOMONAS AERUGINOSA SUSCEPTIBILITIES PERFORMED ON PREVIOUS CULTURE WITHIN THE LAST 5 DAYS.    Report Status 11/21/2014 FINAL  Final  Blood culture (routine x 2)     Status: None   Collection Time: 11/18/14 12:38 AM  Result Value Ref Range Status   Specimen Description BLOOD LEFT ARM  Final   Special Requests BOTTLES DRAWN AEROBIC AND ANAEROBIC 5CC  Final   Culture  Setup Time   Final  GRAM NEGATIVE RODS AEROBIC BOTTLE ONLY CRITICAL RESULT CALLED TO, READ BACK BY AND VERIFIED WITH: HITT,L RN 11/18/14 2015 WOOTEN,K CONFIRMED BY K BARR    Culture PSEUDOMONAS AERUGINOSA  Final   Report Status 11/21/2014 FINAL  Final   Organism ID, Bacteria PSEUDOMONAS  AERUGINOSA  Final      Susceptibility   Pseudomonas aeruginosa - MIC*    CEFTAZIDIME 4 SENSITIVE Sensitive     CIPROFLOXACIN <=0.25 SENSITIVE Sensitive     GENTAMICIN 2 SENSITIVE Sensitive     IMIPENEM 0.5 SENSITIVE Sensitive     PIP/TAZO 8 SENSITIVE Sensitive     CEFEPIME 2 SENSITIVE Sensitive     * PSEUDOMONAS AERUGINOSA  MRSA PCR Screening     Status: None   Collection Time: 11/18/14  4:29 AM  Result Value Ref Range Status   MRSA by PCR NEGATIVE NEGATIVE Final    Comment:        The GeneXpert MRSA Assay (FDA approved for NASAL specimens only), is one component of a comprehensive MRSA colonization surveillance program. It is not intended to diagnose MRSA infection nor to guide or monitor treatment for MRSA infections.   Urine culture     Status: None   Collection Time: 11/18/14  3:10 PM  Result Value Ref Range Status   Specimen Description URINE, RANDOM  Final   Special Requests NONE  Final   Culture MULTIPLE SPECIES PRESENT, SUGGEST RECOLLECTION  Final   Report Status 11/20/2014 FINAL  Final  Culture, blood (routine x 2)     Status: None (Preliminary result)   Collection Time: 11/21/14  1:05 PM  Result Value Ref Range Status   Specimen Description BLOOD RIGHT ANTECUBITAL  Final   Special Requests BOTTLES DRAWN AEROBIC AND ANAEROBIC 10CC  Final   Culture PENDING  Incomplete   Report Status PENDING  Incomplete  Culture, blood (routine x 2)     Status: None (Preliminary result)   Collection Time: 11/21/14  1:11 PM  Result Value Ref Range Status   Specimen Description BLOOD RIGHT ARM  Final   Special Requests BOTTLES DRAWN AEROBIC AND ANAEROBIC 5CC  Final   Culture PENDING  Incomplete   Report Status PENDING  Incomplete    Radiology Reports Dg Chest 2 View  10/28/2014   CLINICAL DATA:  Shortness of breath  EXAM: CHEST  2 VIEW  COMPARISON:  07/16/2014  FINDINGS: There is hyperinflation of the lungs compatible with COPD. Prior CABG. Cardiomegaly. Lungs are clear. No  effusions. No acute bony abnormality.  IMPRESSION: COPD, cardiomegaly.  No active disease.   Electronically Signed   By: Charlett Nose M.D.   On: 10/28/2014 10:23   Mr Abdomen Wo Contrast  11/18/2014   CLINICAL DATA:  Elevated liver function tests. Remote history of breast cancer. Question of duodenal or pancreatic mass on prior exam.  EXAM: MRI ABDOMEN WITHOUT CONTRAST  TECHNIQUE: Multiplanar multisequence MR imaging was performed without the administration of intravenous contrast.  COMPARISON:  CT abdomen/pelvis 11/18/2014 and 05/04/2014, chest CT with incomplete upper abdominal imaging 09/25/2010  FINDINGS: Contrast was not administered because the patient was unable to remain still and requested that the examination be terminated. There is significant motion artifact on multiple imaging sequences.  There is mild central intrahepatic ductal dilatation and mild periportal edema. Several too small to characterize hepatic T2 hyperintense lesions are most likely cysts. There is trace porta hepatis stranding/fluid but no definable collection is identified. Again noted is suggestion of irregular soft tissue at the porta  hepatis just inferior to the portal vein but this cannot be measured with accuracy due to motion artifact on the current exam.  The common duct is normal in caliber and no filling defect is identified.  The pancreas is unremarkable without pancreatic ductal dilatation.  Adrenal glands are normal. Probable subcentimeter left renal cortical cyst. No hydronephrosis.  No measurable lymphadenopathy or aortic aneurysm. Artifact from sternal wires partly visualized.  IMPRESSION: Minimal periportal edema and central mild intrahepatic ductal dilatation. No hepatic mass is identified. Evaluation is limited by patient motion and lack of ability to administer contrast. There is a persisting question of soft tissue mass within the porta hepatis inferior to the portal vein which could be a lymph node or mass  producing minimal central obstruction. Other etiologies could include stricture, occult stone, or bile duct mass not visualized. Groove pancreatitis is felt less likely because the patient reportedly has normal pancreatic enzymes at recent lab values.  No gross evidence for pancreatic mass.  Findings discussed in exam reviewed with Dr. Juanda Chance by Dr. Chilton Si on 11/18/2014 at 12:40 p.m.   Electronically Signed   By: Christiana Pellant M.D.   On: 11/18/2014 12:46   US Abdomen Complete  10/28/2014   CLINICAL DATA:  Nausea, hypertension, hyperlipidemia, COPD, smoking, coronary artery disease post CABG, atrial fibrillation, CHF  EXAM: ULTRASOUND ABDOMEN COMPLETE  COMPARISON:  CT abdomen and pelvis 05/04/2014  FINDINGS: Gallbladder: Surgically absent  Common bile duct: Diameter: 5 mm diameter common normal  Liver: Mildly heterogeneous echogenicity without focal mass. Hepatopetal portal venous flow.  IVC: Normal appearance  Pancreas: Normal appearance  Spleen: Normal appearance, 11.4 cm length  Right Kidney: Length: 11.9 cm. Cortical thinning. Normal cortical echogenicity. No mass hydronephrosis.  Left Kidney: Length: 9.9 cm. Normal cortical echogenicity. Cortical thinning. No mass or hydronephrosis.  Abdominal aorta: Atherosclerotic changes without aneurysm.  Other findings: No free fluid  IMPRESSION: Post cholecystectomy.  Heterogeneous hepatic echogenicity without discrete mass.  BILATERAL renal cortical atrophy.  Otherwise negative exam.   Electronically Signed   By: Ulyses Southward M.D.   On: 10/28/2014 15:11   Ct Abdomen Pelvis W Contrast  11/18/2014   CLINICAL DATA:  81 year old female with fever and abdominal pain.  EXAM: CT ABDOMEN AND PELVIS WITH CONTRAST  TECHNIQUE: Multidetector CT imaging of the abdomen and pelvis was performed using the standard protocol following bolus administration of intravenous contrast.  CONTRAST:  80mL OMNIPAQUE IOHEXOL 300 MG/ML  SOLN  COMPARISON:  CT dated 05/04/2014 and ultrasound dated  10/28/2014  FINDINGS: Evaluation is limited due to streak artifact caused by patient's arms and metallic left hip arthroplasty.  Emphysematous changes of the lung bases. There is coronary vascular calcification. No intra-abdominal free air or free fluid.  There is mild hepatic surface irregularity. A 1.6 x 1.0 cm hypodense lesion in the inferior right lobe of the liver (series 201 image 37) is not well characterized. MRI may provide better evaluation. Cholecystectomy. The pancreas is atrophic. There is an ill-defined soft tissue prominence involving the head of the pancreas at the ampulla of Vater. This area is not well evaluated on the CT. The the capillary mass or pancreatic lesion is not excluded. Endoscopic ultrasound is recommended for better evaluation. The spleen, adrenal glands appear unremarkable. There is moderate bilateral renal atrophy. There is no hydronephrosis. The visualized ureters and urinary bladder appear unremarkable. Hysterectomy.  Extensive sigmoid diverticulosis with muscular hypertrophy. No definite active inflammatory changes. Moderate stool in the distal colon. No evidence of bowel  obstruction. There is a 1.6 cm duodenal diverticulum. There is soft tissue density along the medial wall of the second portion of the duodenum (series 201, image 35). Normal appendix.  There is aortoiliac atherosclerotic disease. A 3.2 cm infrarenal abdominal aortic aneurysm. The origins of the celiac axis, SMA, IMA as well as the origins of the renal arteries appear patent. No portal venous gas identified. Ill-defined nodular soft tissue density in the region of the porta hepatis likely represent lymph nodes. There is diastases of anterior abdominal wall musculature in the midline with a small fat containing umbilical hernia. Degenerative changes of the spine. Left hip arthroplasty. No acute fracture.  IMPRESSION: Ill-defined soft tissue thickness involving the head and uncinate process of the pancreas as well  as the medial wall of the second portion of the duodenum. MRI or endoscopic ultrasound is recommended for further evaluation.  Sigmoid diverticulosis. No evidence of bowel obstruction or inflammation. Normal appendix.  Incompletely characterized hypodense lesion along the inferior surface of the right lobe of the liver. MRI may provide better characterization.   Electronically Signed   By: Elgie Collard M.D.   On: 11/18/2014 01:03   Dg Chest Port 1 View  11/19/2014   CLINICAL DATA:  Shortness of breath, history of COPD on home oxygen, history of CHF and CABG, previous tobacco use.  EXAM: PORTABLE CHEST - 1 VIEW  COMPARISON:  Portable chest x-ray of November 17, 2014  FINDINGS: The lungs remain hyperinflated. There is no focal infiltrate. The cardiac silhouette remains enlarged. The pulmonary vascularity is prominent centrally. There is no pulmonary interstitial edema. There are post CABG changes. There is no pleural effusion. The bony thorax exhibits no acute abnormality.  IMPRESSION: COPD with low-grade compensated CHF. There is no pulmonary edema or pneumonia.   Electronically Signed   By: David  [place] M.D.   On: 11/19/2014 07:59   Dg Chest Portable 1 View  11/17/2014   CLINICAL DATA:  81 year old female with shortness of breath and tachycardia.  EXAM: PORTABLE CHEST - 1 VIEW  COMPARISON:  Radiograph dated 10/28/2014  FINDINGS: Single-view of the chest demonstrate emphysematous changes of the lungs. There is no focal consolidation, pleural effusion, or pneumothorax. The cardiomediastinal silhouette is within normal limits. Degenerative changes of the osseous structures.  IMPRESSION: Emphysema.  No focal consolidation or pneumothorax.   Electronically Signed   By: Elgie Collard M.D.   On: 11/17/2014 22:50   Dg Ercp Biliary & Pancreatic Ducts  11/20/2014   CLINICAL DATA:  81 year old female with choledocholithiasis  EXAM: ERCP  TECHNIQUE: Multiple spot images obtained with the fluoroscopic device and  submitted for interpretation post-procedure.  FLUOROSCOPY TIME:  Please see operative report for further detail.  COMPARISON:  MRCP 11/18/2014  FINDINGS: A total of 8 intraoperative spot films demonstrate a flexible endoscope in the descending duodenum followed by wire cannulation of the common bile duct and cholangiogram. Cholangiogram demonstrates several rounded filling defects in the distal common bile duct consistent with choledocholithiasis. The images demonstrate balloon sweep of the common bile duct. On the final image, no further filling defects are identified.  IMPRESSION: Choledocholithiasis with sphincterotomy and balloon sweep of the common bile duct.  These images were submitted for radiologic interpretation only. Please see the procedural report for the amount of contrast and the fluoroscopy time utilized.   Electronically Signed   By: Malachy Moan M.D.   On: 11/20/2014 09:42     CBC  Recent Labs Lab 11/17/14 2222 11/18/14 0321 11/19/14  0235 11/20/14 0259 11/21/14 0246  WBC 17.9* 18.2* 9.4 6.4 8.4  HGB 11.9* 9.9* 9.5* 9.0* 9.2*  HCT 36.2 30.3* 29.9* 27.3* 27.5*  PLT 192 155 136* 138* 170  MCV 87.7 87.8 89.0 87.8 88.1  MCH 28.8 28.7 28.3 28.9 29.5  MCHC 32.9 32.7 31.8 33.0 33.5  RDW 13.8 14.0 14.1 14.0 14.0  LYMPHSABS 0.2* 0.2*  --   --   --   MONOABS 0.8 1.1*  --   --   --   EOSABS 0.0 0.0  --   --   --   BASOSABS 0.0 0.0  --   --   --     Chemistries   Recent Labs Lab 11/18/14 0321 11/19/14 0235 11/20/14 0259 11/21/14 0246 11/22/14 0533  NA 131* 133* 131* 131* 134*  K 3.4* 3.9 4.4 4.3 4.3  CL 89* 95* 97* 98* 102  CO2 32 GLUCOSE 177* 117* 163* 182* 246*  BUN 28* 30*  CREATININE 1.26* 1.26* 1.04* 1.22* 1.16*  CALCIUM 9.2 9.2 9.0 9.4 9.6  MG  --  1.6* 2.4  --   --   AST 223* 87* 34 23 20  ALT 300* 198* 137* 105* 82*  ALKPHOS 230* 199* 178* 158* 146*  BILITOT 4.1* 2.8* 1.6* 1.2 1.3*    ------------------------------------------------------------------------------------------------------------------ estimated creatinine clearance is 46.4 mL/min (by C-G formula based on Cr of 1.16). ------------------------------------------------------------------------------------------------------------------ No results for input(s): HGBA1C in the last 72 hours. ------------------------------------------------------------------------------------------------------------------ No results for input(s): CHOL, HDL, LDLCALC, TRIG, CHOLHDL, LDLDIRECT in the last 72 hours. ------------------------------------------------------------------------------------------------------------------ No results for input(s): TSH, T4TOTAL, T3FREE, THYROIDAB in the last 72 hours.  Invalid input(s): FREET3 ------------------------------------------------------------------------------------------------------------------ No results for input(s): VITAMINB12, FOLATE, FERRITIN, TIBC, IRON, RETICCTPCT in the last 72 hours.  Coagulation profile  Recent Labs Lab 11/17/14 2222 11/18/14 0321  INR 1.13 1.32    No results for input(s): DDIMER in the last 72 hours.  Cardiac Enzymes  Recent Labs Lab 11/18/14 0321 11/18/14 0845 11/18/14 1536  TROPONINI 0.12* 0.04* 0.04*   ------------------------------------------------------------------------------------------------------------------ Invalid input(s): POCBNP   Time Spent in minutes  35   SINGH,PRASHANT K M.D on 11/22/2014 at 11:31 AM  Between 7am to 7pm - Pager - [phone]  After 7pm go to [email or web address] - password [facility]  [facility] -  Office  [phone]

## 2014-11-23 LAB — GLUCOSE, CAPILLARY: Glucose-Capillary: 201 mg/dL — ABNORMAL HIGH (ref 65–99)

## 2014-11-23 MED ORDER — INSULIN ASPART 100 UNIT/ML ~~LOC~~ SOLN: SUBCUTANEOUS | Status: DC

## 2014-11-23 MED ORDER — DILTIAZEM HCL 120 MG PO TABS
120.0000 mg | ORAL_TABLET | Freq: Three times a day (TID) | ORAL | Status: DC
Start: 2014-11-23 — End: 2014-12-26

## 2014-11-23 MED ORDER — PREDNISONE 5 MG PO TABS: ORAL_TABLET | ORAL | Status: DC

## 2014-11-23 MED ORDER — CIPROFLOXACIN HCL 500 MG PO TABS: 500.0000 mg | ORAL_TABLET | Freq: Two times a day (BID) | ORAL | Status: DC

## 2014-11-23 MED ORDER — INSULIN GLARGINE 100 UNIT/ML ~~LOC~~ SOLN: 20.0000 [IU] | Freq: Every day | SUBCUTANEOUS | Status: DC

## 2014-11-23 MED ORDER — ALPRAZOLAM 0.25 MG PO TABS: 0.2500 mg | ORAL_TABLET | Freq: Every evening | ORAL | Status: DC | PRN

## 2014-11-23 MED ORDER — INSULIN GLARGINE 100 UNIT/ML ~~LOC~~ SOLN: 15.0000 [IU] | Freq: Every day | SUBCUTANEOUS | Status: DC

## 2014-11-23 MED ORDER — CEFPODOXIME PROXETIL 200 MG PO TABS: 200.0000 mg | ORAL_TABLET | Freq: Two times a day (BID) | ORAL | Status: DC

## 2014-11-23 NOTE — Discharge Instructions (Signed)
Follow with Primary MD Kaleen Mask, MD in 7 days   Get CBC, CMP, 2 view Chest X ray checked  by Primary MD next visit.    Activity: As tolerated with Full fall precautions use walker/cane & assistance as needed   Disposition SNF   Diet: Heart Healthy Low Carb.  Accuchecks 4 times/day, Once in AM empty stomach and then before each meal. Log in all results and show them to your Prim.MD in 3 days. If any glucose reading is under 80 or above 300 call your Prim MD immidiately. Follow Low glucose instructions for glucose under 80 as instructed.   For Heart failure patients - Check your Weight same time everyday, if you gain over 2 pounds, or you develop in leg swelling, experience more shortness of breath or chest pain, call your Primary MD immediately. Follow Cardiac Low Salt Diet and 1.5 lit/day fluid restriction.   On your next visit with your primary care physician please Get Medicines reviewed and adjusted.   Please request your Prim.MD to go over all Hospital Tests and Procedure/Radiological results at the follow up, please get all Hospital records sent to your Prim MD by signing hospital release before you go home.   If you experience worsening of your admission symptoms, develop shortness of breath, life threatening emergency, suicidal or homicidal thoughts you must seek medical attention immediately by calling 911 or calling your MD immediately  if symptoms less severe.  You Must read complete instructions/literature along with all the possible adverse reactions/side effects for all the Medicines you take and that have been prescribed to you. Take any new Medicines after you have completely understood and accpet all the possible adverse reactions/side effects.   Do not drive, operating heavy machinery, perform activities at heights, swimming or participation in water activities or provide baby sitting services if your were admitted for syncope or siezures until you have  seen by Primary MD or a Neurologist and advised to do so again.  Do not drive when taking Pain medications.    Do not take more than prescribed Pain, Sleep and Anxiety Medications  Special Instructions: If you have smoked or chewed Tobacco  in the last 2 yrs please stop smoking, stop any regular Alcohol  and or any Recreational drug use.  Wear Seat belts while driving.   Please note  You were cared for by a hospitalist during your hospital stay. If you have any questions about your discharge medications or the care you received while you were in the hospital after you are discharged, you can call the unit and asked to speak with the hospitalist on call if the hospitalist that took care of you is not available. Once you are discharged, your primary care physician will handle any further medical issues. Please note that NO REFILLS for any discharge medications will be authorized once you are discharged, as it is imperative that you return to your primary care physician (or establish a relationship with a primary care physician if you do not have one) for your aftercare needs so that they can reassess your need for medications and monitor your lab values.

## 2014-11-23 NOTE — Progress Notes (Signed)
Pt prepared for d/c to SNF. IV d/c'd. Skin intact except as most recently charted. Vitals are stable. Report called to receiving facility. Pt to be transported by ambulance service. 

## 2014-11-23 NOTE — Clinical Social Work Placement (Signed)
   CLINICAL SOCIAL WORK PLACEMENT  NOTE  Date:  11/23/2014  Patient Details  Name: [name] MRN: [id number] Date of Birth: [birth year]  Clinical Social Work is seeking post-discharge placement for this patient at the Skilled  Nursing Facility level of care (*CSW will initial, date and re-position this form in  chart as items are completed):  Yes   Patient/family provided with [facility] Clinical Social Work Department's list of facilities offering this level of care within the geographic area requested by the patient (or if unable, by the patient's family).  Yes   Patient/family informed of their freedom to choose among providers that offer the needed level of care, that participate in Medicare, Medicaid or managed care program needed by the patient, have an available bed and are willing to accept the patient.  Yes   Patient/family informed of [facility] ownership interest in [facility] and [facility], as well as of the fact that they are under no obligation to receive care at these facilities.  PASRR submitted to EDS on       PASRR number received on       Existing PASRR number confirmed on 11/19/14     FL2 transmitted to all facilities in geographic area requested by pt/family on 11/19/14     FL2 transmitted to all facilities within larger geographic area on 11/19/14     Patient informed that his/her managed care company has contracts with or will negotiate with certain facilities, including the following:        Yes   Patient/family informed of bed offers received.  Patient chooses bed at [facility] [place]     Physician recommends and patient chooses bed at      Patient to be transferred to [facility] [place] on 11/23/14.  Patient to be transferred to facility by ambulance     Patient family notified on 11/23/14 of transfer.  Name of family member notified:  [name]     PHYSICIAN       Additional Comment:  Per MD patient ready for DC to  [facility] [place]. RN, patient, patient's family, and facility notified of DC. RN given number for report. DC packet on chart. Ambulance transport requested for patient. CSW signing off.   _______________________________________________ Roddie Mc MSW, [place], [place], [phone]

## 2014-11-23 NOTE — Care Management Note (Signed)
Case Management Note  Patient Details  Name: [name] MRN: [id number] Date of Birth: 22-Nov-1929  Subjective/Objective:                 Patient admitted with sepsis. Discharging today to SNF.   Action/Plan:  Patient discharging to [facility].   Expected Discharge Date:  11/19/14               Expected Discharge Plan:  Skilled Nursing Facility  In-House Referral:  Clinical Social Work  Discharge planning Services  CM Consult  Post Acute Care Choice:  Durable Medical Equipment Choice offered to:     DME Arranged:  Oxygen (PT STATES USES [facility] FOR O2) DME Agency:  [facility]  HH Arranged:    HH Agency:     Status of Service:  In process, will continue to follow  Medicare Important Message Given:  Yes-third notification given Date Medicare IM Given:    Medicare IM give by:    Date Additional Medicare IM Given:    Additional Medicare Important Message give by:     If discussed at Long Length of Stay Meetings, dates discussed:    Additional Comments:  Lawerance Sabal, RN 11/23/2014, 11:01 AM

## 2014-11-23 NOTE — Discharge Summary (Addendum)
[name], is a 81 y.o. female  DOB Mar 22, 1930  MRN [id number].  Admission date:  11/17/2014  Admitting Physician  Rolly Salter, MD  Discharge Date:  11/23/2014   Primary MD  Kaleen Mask, MD  Recommendations for primary care physician for things to follow:   Monitor CBGs and adjust insulin dose carefully.  Check CBC, CMP 2 view chest x-ray in a week   Admission Diagnosis  aloc elevated lfts, ? pancreatic mass.   Discharge Diagnosis  aloc elevated lfts, ? pancreatic mass.    Principal Problem:   Sepsis Active Problems:   OXYGEN-USE OF SUPPLEMENTAL   Hypertension   COPD (chronic obstructive pulmonary disease)   Hx of CABG   Chronic diastolic CHF (congestive heart failure)   Atrial fibrillation with rapid ventricular response   Transaminitis   Pancreatic lesion   Elevated troponin   SOB (shortness of breath)   Bile duct obstruction   Elevated LFTs      Past Medical History  Diagnosis Date  . CAD (coronary artery disease)     a. s/p CABG x5 (2005) b. s/p BMS-prox SVG-D2 (2014)  . Hypertension   . Hyperlipidemia   . GI bleed 2008    AVMs  . Aneurysm, aortic     thoracic aorta, stable at 4.1 cm, chest CT, May, 2012  . Syncope     Nitroglycerin plus a diuretic, April, 2009  . Hyponatremia     Chronic. Felt secondary to SIADH   . COPD (chronic obstructive pulmonary disease)     on home oxygen  . Tobacco abuse   . Bradycardia   . Carotid artery disease     Hx of endarterectomy. Doppler October, 2011, stable, 0-39% RIC A., 40-59% LICA  . Tuberculosis     Exposures to tuberculosis 1970s, has tested negative by the health Department  . Atrial fibrillation     Paroxysmal with RVR 07/2011, not felt to be a coumadin candidate secondary to hx of GIB/AVM  . Venous stasis of lower  extremity     Chronic  . CHF with left ventricular diastolic dysfunction, NYHA class 2     a. EF 55-60%, echo, April, 2013 b. EF 55-60%, mild biatrial enlargement and PASP 42 mmH  . AAA (abdominal aortic aneurysm)   . Complication of anesthesia   . Hx of CABG     CABG 2005  . Hematoma     Right groin hematoma, small, post cath, April, 2014  . Mitral regurgitation     Mild, hospital, March, 2014  . Leg ulcer     Ulcerated lesion on the anterior aspect of her left lower leg, June, 2014    Past Surgical History  Procedure Laterality Date  . Cholecystectomy    . Carotid endartercetomy    . Abdominal hysterectomy    . Coronary artery bypass graft  2005  . Coronary angioplasty with stent placement  07/18/12    severe native coronary artery disease, 99% proximal stenosis of the SVG-D2 status post  successful PTCA/PCI with a Veriflex bare-metal stent, widely patent SVGs to both the right PDA sequential OM1 to OM 2, EF 65-70% and 3+ mitral regurgitation  . Left heart catheterization with coronary angiogram N/A 07/18/2012    Procedure: LEFT HEART CATHETERIZATION WITH CORONARY ANGIOGRAM;  Surgeon: Marykay Lex, MD;  Location: [facility] CATH LAB;  Service: Cardiovascular;  Laterality: N/A;  . Percutaneous coronary stent intervention (pci-s)  07/18/2012    Procedure: PERCUTANEOUS CORONARY STENT INTERVENTION (PCI-S);  Surgeon: Marykay Lex, MD;  Location: [facility] CATH LAB;  Service: Cardiovascular;;  . Ercp N/A 11/20/2014    Procedure: ENDOSCOPIC RETROGRADE CHOLANGIOPANCREATOGRAPHY (ERCP);  Surgeon: Meryl Dare, MD;  Location: [facility] ENDOSCOPY;  Service: Endoscopy;  Laterality: N/A;       HPI  from the history and physical done on the day of admission:    [name] is a 81 y.o. female with Past medical history of coronary artery disease status post CABG, GI bleeding, atrial fibrillation not on any anticoagulation, hypertension, peripheral vascular disease, COPD, chronic respiratory failure on  oxygen, chronic diastolic dysfunction. The patient is presenting with complaint of shortness of breath. Next and the patient is a poor historian as she is unable to provide any significant history. There was no family member available at the time of my evaluation therefore the history has been obtained from ED documentation. The patient was initially brought to ER for acute encephalopathy as well as shortness of breath. Secondary to her general condition critical care was consulted and later on since critical care for the patient to be appropriately treated in stepdown unit hospitalist was consulted for admission. The patient at the time of my evaluation denies any fall, passing out episodes, headache, focal deficit, chest pain, abdominal pain, cough, nausea, vomiting, diarrhea, constipation, burning urination. Patient mentions her daughter-in-law told that she was more short of breath. Reportedly she was significantly weak and was not eating enough as well as was confused and was not acting herself.  The patient is coming from home  At her baseline ambulates with walker And is independent for most of her ADL does not manages her medication on her own.     Hospital Course:      1. Sepsis - unclear presentation with generalized weakness, leukocytosis, encephalopathy. With pseudomonas UTI and bacteremia. Also evidence of passed CBD stone clinically. She was having nonspecific nausea vomiting and GI discomfort for the last few weeks, came to the hospital with elevated liver enzymes with CT MRI suggesting possible pancreatic mass. Was placed on anti-biotics for UTI and bacteremia, stop date for Cipro will be 12/02/2014.   For her elevated liver enzymes and possible pancreatic mass she was seen by GI see #2 below.   2. Elevated liver enzymes with questionable pancreatic mass on CT and MRI abdomen. Could have had mild cholangitis as she had leukocytosis and encephalopathy on admission caused by  transient CBD sludge/stones which she passed, ERCP with brushing inconclusive, may require EUS later upper GI. Will require [facility] follow-up.   3. COPD. On home oxygen, currently at baseline, no wheezing on exam, but minimal movement, she did not have much wheezing but responded very well to IV steroids indicating primarily emphysematus COPD, will taper to oral prednisone, chest x-ray remained nonacute. Continue supportive care with oxygen nebulizer treatments as needed.   4. Atrial fibrillation chronic in RVR upon admission. [place] Vasc of greater than 3. Now in rate control have placed on Cardizem oral (was on 300 mg  at home) continue Toprol-XL. Not on anticoagulation due to previous GI bleed. Was seen by cardiology. Goal will be rate controlled.   5. CAD with history of chronic diastolic CHF last EF of 55% in 2014. Clinically compensated from CHF standpoint, chest pain-free, mild elevation of troponin not in ACS pattern. Continue aspirin, beta blocker and statin for secondary prevention. Stable TTE.   6.Hyponatremia. Was dehydrated as her oral intake prior to admission was poor, improved after hydration with IV fluids continue gentle IV fluids.   7. Hypokalemia. Replaced.   8. GERD. On PPI continue.   9. Dyslipidemia. On home dose statin continue.    10. Encephalopathy due to #1 above. Much improved now at baseline, continue supportive care, at risk for delirium. Minimize narcotics and benzodiazepine's.    11.UTI with pansensitive pseudomonas bacteremia and sepsis - on IV ceftaz appropriate anti-biotic on since 11/17/2014 (initially was on Zosyn), will require total of 14 days. Cipro  upon discharge stop date is above.   12. DM type II. Continued Glucophage due to her age, placed on low-dose Lantus along with sliding scale.  Will Need close glucose monitoring and insulin adjustment with tapering prednisone.  Lab Results  Component Value Date   HGBA1C 5.5 11/18/2014   CBG  (last 3)   Recent Labs  11/22/14 1158 11/22/14 1722 11/23/14 0816  GLUCAP 272* 237* 201*          Discharge Condition: Stable  Follow UP  Follow-up Information    Follow up with Kaleen Mask, MD. Schedule an appointment as soon as possible for a visit in 1 week.   Specialty:  Family Medicine   Contact information:   [address] [place] [address] [phone]       Follow up with Willa Rough, MD. Schedule an appointment as soon as possible for a visit in 1 week.   Specialty:  Cardiology   Why:  Afib   Contact information:   [address] [place] [address] [phone]       Follow up with Rachael Fee, MD. Schedule an appointment as soon as possible for a visit in 1 week.   Specialty:  Gastroenterology   Why:  EUS   Contact information:   [address] [place] [address] [phone]        Consults obtained - GI, Cards  Diet and Activity recommendation: See Discharge Instructions below  Discharge Instructions           Discharge Instructions    Discharge instructions    Complete by:  As directed   Follow with Primary MD Kaleen Mask, MD in 7 days   Get CBC, CMP, 2 view Chest X ray checked  by Primary MD next visit.    Activity: As tolerated with Full fall precautions use walker/cane & assistance as needed   Disposition SNF   Diet: Heart Healthy Low Carb.  Accuchecks 4 times/day, Once in AM empty stomach and then before each meal. Log in all results and show them to your Prim.MD in 3 days. If any glucose reading is under 80 or above 300 call your Prim MD immidiately. Follow Low glucose instructions for glucose under 80 as instructed.    For Heart failure patients - Check your Weight same time everyday, if you gain over 2 pounds, or you develop in leg swelling, experience more shortness of breath or chest pain, call your Primary MD immediately. Follow Cardiac Low Salt Diet and 1.5  lit/day fluid restriction.   On your next visit with your primary care physician please Get Medicines reviewed and adjusted.   Please request your Prim.MD to go over all Hospital Tests and Procedure/Radiological results at the follow up, please get all Hospital records sent to your Prim MD by signing hospital release before you go home.   If you experience worsening of your admission symptoms, develop shortness of breath, life threatening emergency, suicidal or homicidal thoughts you must seek medical attention immediately by calling 911 or calling your MD immediately  if symptoms less severe.  You Must read complete instructions/literature along with all the possible adverse reactions/side effects for all the Medicines you take and that have been prescribed to you. Take any new Medicines after you have completely understood and accpet all the possible adverse reactions/side effects.   Do not drive, operating heavy machinery, perform activities at heights, swimming or participation in water activities or provide baby sitting services if your were admitted for syncope or siezures until you have seen by Primary MD or a Neurologist and advised to do so again.  Do not drive when taking Pain medications.    Do not take more than prescribed Pain, Sleep and Anxiety Medications  Special Instructions: If you have smoked or chewed Tobacco  in the last 2 yrs please stop smoking, stop any regular Alcohol  and or any Recreational drug use.  Wear Seat belts while driving.   Please note  You were cared for by a hospitalist during your hospital stay. If you have any questions about your discharge medications or the care you received while you were in the hospital after you are discharged, you can call the unit and asked to speak with the hospitalist on call if the hospitalist that took care of you is not available. Once you are discharged, your primary care physician will handle any further medical  issues. Please note that NO REFILLS for any discharge medications will be authorized once you are discharged, as it is imperative that you return to your primary care physician (or establish a relationship with a primary care physician if you do not have one) for your aftercare needs so that they can reassess your need for medications and monitor your lab values.     Increase activity slowly    Complete by:  As directed              Discharge Medications       Medication List    STOP taking these medications        CARTIA XT 300 MG 24 hr capsule  Generic drug:  diltiazem     diazepam 2 MG tablet  Commonly known as:  VALIUM     metFORMIN 500 MG tablet  Commonly known as:  GLUCOPHAGE      TAKE these medications        albuterol 108 (90 BASE) MCG/ACT inhaler  Commonly known as:  PROVENTIL HFA;VENTOLIN HFA  Inhale 2 puffs into the lungs every 4 (four) hours as needed for wheezing or shortness of breath.     albuterol (2.5 MG/3ML) 0.083% nebulizer solution  Commonly known as:  PROVENTIL  Take 2.5 mg by nebulization every 6 (six) hours as needed for wheezing.     ALPRAZolam 0.25 MG tablet  Commonly known as:  XANAX  Take 1 tablet (0.25 mg total) by mouth at bedtime as needed for anxiety.     aspirin EC 81 MG tablet  Take 81 mg by  mouth daily.     atorvastatin 40 MG tablet  Commonly known as:  LIPITOR  Take 0.5 tablets (20 mg total) by mouth daily at 6 PM.     ciprofloxacin 500 MG tablet  Commonly known as:  CIPRO  Take 1 tablet (500 mg total) by mouth 2 (two) times daily. Stop date 12-01-13     diltiazem 120 MG tablet  Commonly known as:  CARDIZEM  Take 1 tablet (120 mg total) by mouth every 8 (eight) hours.     furosemide 40 MG tablet  Commonly known as:  LASIX  Take 1 tablet (40 mg total) by mouth 2 (two) times daily.     insulin aspart 100 UNIT/ML injection  Commonly known as:  NOVOLOG  Before each meal 3 times a day, 140-199 - 2 units, 200-250 - 4 units,  251-299 - 6 units,  300-349 - 8 units,  350 or above 10 units. Dispense syringes and needles as needed, Ok to switch to PEN if approved. Substitute to any brand approved. DX DM2, Code E11.65     insulin glargine 100 UNIT/ML injection  Commonly known as:  LANTUS  Inject 0.15 mLs (15 Units total) into the skin daily.     magnesium oxide 400 MG tablet  Commonly known as:  MAG-OX  Take 400 mg by mouth daily.     metoprolol succinate 25 MG 24 hr tablet  Commonly known as:  TOPROL-XL  Take 1 tablet (25 mg total) by mouth daily.     nitroGLYCERIN 0.4 MG SL tablet  Commonly known as:  NITROSTAT  Place 0.4 mg under the tongue every 5 (five) minutes as needed for chest pain.     ondansetron 8 MG tablet  Commonly known as:  ZOFRAN  Take 1 tablet (8 mg total) by mouth every 8 (eight) hours as needed for nausea or vomiting.     OXYGEN  Inhale into the lungs. 2L continously     pantoprazole 40 MG tablet  Commonly known as:  PROTONIX  Take 40 mg by mouth daily.     potassium chloride SA 20 MEQ tablet  Commonly known as:  K-DUR,KLOR-CON  Take 20 mEq by mouth 2 (two) times daily.     predniSONE 5 MG tablet  Commonly known as:  DELTASONE  Label  & dispense according to the schedule below. 10 Pills PO for 3 days then, 8 Pills PO for 3 days, 6 Pills PO for 3 days, 4 Pills PO for 3 days, 2 Pills PO for 3 days, 1 Pills PO for 3 days, 1/2 Pill  PO for 3 days then STOP. Total 95 pills.     SYMBICORT 160-4.5 MCG/ACT inhaler  Generic drug:  budesonide-formoterol  USE 2 INHALATIONS TWICE A DAY     Tiotropium Bromide Monohydrate 2.5 MCG/ACT Aers  Commonly known as:  SPIRIVA RESPIMAT  Inhale 2 puffs into the lungs daily.     Vitamin D (Ergocalciferol) 50000 UNITS Caps capsule  Commonly known as:  DRISDOL  Take 50,000 Units by mouth every 14 (fourteen) days. Every other Sunday        Major procedures and Radiology Reports - PLEASE review detailed and final reports for all details, in brief -      CT Abd - ? Pancreatic mass/edema  MRI ABD - ? Pancreatic mass  ERCP done by Dr. Russella Dar on 11/20/2014. Showing CBD stenosis, brush cytology obtained. Prior evidence of Sphinterectomy.  TTE  - Left ventricle: The cavity size was normal.  Wall thickness wasincreased in a pattern of mild LVH. Systolic function was normal.The estimated ejection fraction was in the range of 55% to 60%. Regional wall motion abnormalities cannot be excluded. - Mitral valve: There was mild regurgitation. - Left atrium: The atrium was moderately dilated. - Right atrium: The atrium was mildly dilated. - Pulmonary arteries: PA peak pressure: 35 mm Hg (S).   Dg Chest 2 View  10/28/2014   CLINICAL DATA:  Shortness of breath  EXAM: CHEST  2 VIEW  COMPARISON:  07/16/2014  FINDINGS: There is hyperinflation of the lungs compatible with COPD. Prior CABG. Cardiomegaly. Lungs are clear. No effusions. No acute bony abnormality.  IMPRESSION: COPD, cardiomegaly.  No active disease.   Electronically Signed   By: Charlett Nose M.D.   On: 10/28/2014 10:23   Mr Abdomen Wo Contrast  11/18/2014   CLINICAL DATA:  Elevated liver function tests. Remote history of breast cancer. Question of duodenal or pancreatic mass on prior exam.  EXAM: MRI ABDOMEN WITHOUT CONTRAST  TECHNIQUE: Multiplanar multisequence MR imaging was performed without the administration of intravenous contrast.  COMPARISON:  CT abdomen/pelvis 11/18/2014 and 05/04/2014, chest CT with incomplete upper abdominal imaging 09/25/2010  FINDINGS: Contrast was not administered because the patient was unable to remain still and requested that the examination be terminated. There is significant motion artifact on multiple imaging sequences.  There is mild central intrahepatic ductal dilatation and mild periportal edema. Several too small to characterize hepatic T2 hyperintense lesions are most likely cysts. There is trace porta hepatis stranding/fluid but no definable collection is  identified. Again noted is suggestion of irregular soft tissue at the porta hepatis just inferior to the portal vein but this cannot be measured with accuracy due to motion artifact on the current exam.  The common duct is normal in caliber and no filling defect is identified.  The pancreas is unremarkable without pancreatic ductal dilatation.  Adrenal glands are normal. Probable subcentimeter left renal cortical cyst. No hydronephrosis.  No measurable lymphadenopathy or aortic aneurysm. Artifact from sternal wires partly visualized.  IMPRESSION: Minimal periportal edema and central mild intrahepatic ductal dilatation. No hepatic mass is identified. Evaluation is limited by patient motion and lack of ability to administer contrast. There is a persisting question of soft tissue mass within the porta hepatis inferior to the portal vein which could be a lymph node or mass producing minimal central obstruction. Other etiologies could include stricture, occult stone, or bile duct mass not visualized. Groove pancreatitis is felt less likely because the patient reportedly has normal pancreatic enzymes at recent lab values.  No gross evidence for pancreatic mass.  Findings discussed in exam reviewed with Dr. Juanda Chance by Dr. Chilton Si on 11/18/2014 at 12:40 p.m.   Electronically Signed   By: Christiana Pellant M.D.   On: 11/18/2014 12:46   US Abdomen Complete  10/28/2014   CLINICAL DATA:  Nausea, hypertension, hyperlipidemia, COPD, smoking, coronary artery disease post CABG, atrial fibrillation, CHF  EXAM: ULTRASOUND ABDOMEN COMPLETE  COMPARISON:  CT abdomen and pelvis 05/04/2014  FINDINGS: Gallbladder: Surgically absent  Common bile duct: Diameter: 5 mm diameter common normal  Liver: Mildly heterogeneous echogenicity without focal mass. Hepatopetal portal venous flow.  IVC: Normal appearance  Pancreas: Normal appearance  Spleen: Normal appearance, 11.4 cm length  Right Kidney: Length: 11.9 cm. Cortical thinning. Normal cortical  echogenicity. No mass hydronephrosis.  Left Kidney: Length: 9.9 cm. Normal cortical echogenicity. Cortical thinning. No mass or hydronephrosis.  Abdominal aorta: Atherosclerotic changes without  aneurysm.  Other findings: No free fluid  IMPRESSION: Post cholecystectomy.  Heterogeneous hepatic echogenicity without discrete mass.  BILATERAL renal cortical atrophy.  Otherwise negative exam.   Electronically Signed   By: Ulyses Southward M.D.   On: 10/28/2014 15:11   Ct Abdomen Pelvis W Contrast  11/18/2014   CLINICAL DATA:  81 year old female with fever and abdominal pain.  EXAM: CT ABDOMEN AND PELVIS WITH CONTRAST  TECHNIQUE: Multidetector CT imaging of the abdomen and pelvis was performed using the standard protocol following bolus administration of intravenous contrast.  CONTRAST:  80mL OMNIPAQUE IOHEXOL 300 MG/ML  SOLN  COMPARISON:  CT dated 05/04/2014 and ultrasound dated 10/28/2014  FINDINGS: Evaluation is limited due to streak artifact caused by patient's arms and metallic left hip arthroplasty.  Emphysematous changes of the lung bases. There is coronary vascular calcification. No intra-abdominal free air or free fluid.  There is mild hepatic surface irregularity. A 1.6 x 1.0 cm hypodense lesion in the inferior right lobe of the liver (series 201 image 37) is not well characterized. MRI may provide better evaluation. Cholecystectomy. The pancreas is atrophic. There is an ill-defined soft tissue prominence involving the head of the pancreas at the ampulla of Vater. This area is not well evaluated on the CT. The the capillary mass or pancreatic lesion is not excluded. Endoscopic ultrasound is recommended for better evaluation. The spleen, adrenal glands appear unremarkable. There is moderate bilateral renal atrophy. There is no hydronephrosis. The visualized ureters and urinary bladder appear unremarkable. Hysterectomy.  Extensive sigmoid diverticulosis with muscular hypertrophy. No definite active inflammatory  changes. Moderate stool in the distal colon. No evidence of bowel obstruction. There is a 1.6 cm duodenal diverticulum. There is soft tissue density along the medial wall of the second portion of the duodenum (series 201, image 35). Normal appendix.  There is aortoiliac atherosclerotic disease. A 3.2 cm infrarenal abdominal aortic aneurysm. The origins of the celiac axis, SMA, IMA as well as the origins of the renal arteries appear patent. No portal venous gas identified. Ill-defined nodular soft tissue density in the region of the porta hepatis likely represent lymph nodes. There is diastases of anterior abdominal wall musculature in the midline with a small fat containing umbilical hernia. Degenerative changes of the spine. Left hip arthroplasty. No acute fracture.  IMPRESSION: Ill-defined soft tissue thickness involving the head and uncinate process of the pancreas as well as the medial wall of the second portion of the duodenum. MRI or endoscopic ultrasound is recommended for further evaluation.  Sigmoid diverticulosis. No evidence of bowel obstruction or inflammation. Normal appendix.  Incompletely characterized hypodense lesion along the inferior surface of the right lobe of the liver. MRI may provide better characterization.   Electronically Signed   By: Elgie Collard M.D.   On: 11/18/2014 01:03   Dg Chest Port 1 View  11/19/2014   CLINICAL DATA:  Shortness of breath, history of COPD on home oxygen, history of CHF and CABG, previous tobacco use.  EXAM: PORTABLE CHEST - 1 VIEW  COMPARISON:  Portable chest x-ray of November 17, 2014  FINDINGS: The lungs remain hyperinflated. There is no focal infiltrate. The cardiac silhouette remains enlarged. The pulmonary vascularity is prominent centrally. There is no pulmonary interstitial edema. There are post CABG changes. There is no pleural effusion. The bony thorax exhibits no acute abnormality.  IMPRESSION: COPD with low-grade compensated CHF. There is no pulmonary  edema or pneumonia.   Electronically Signed   By: David  [place] M.D.  On: 11/19/2014 07:59   Dg Chest Portable 1 View  11/17/2014   CLINICAL DATA:  81 year old female with shortness of breath and tachycardia.  EXAM: PORTABLE CHEST - 1 VIEW  COMPARISON:  Radiograph dated 10/28/2014  FINDINGS: Single-view of the chest demonstrate emphysematous changes of the lungs. There is no focal consolidation, pleural effusion, or pneumothorax. The cardiomediastinal silhouette is within normal limits. Degenerative changes of the osseous structures.  IMPRESSION: Emphysema.  No focal consolidation or pneumothorax.   Electronically Signed   By: Elgie Collard M.D.   On: 11/17/2014 22:50   Dg Ercp Biliary & Pancreatic Ducts  11/20/2014   CLINICAL DATA:  81 year old female with choledocholithiasis  EXAM: ERCP  TECHNIQUE: Multiple spot images obtained with the fluoroscopic device and submitted for interpretation post-procedure.  FLUOROSCOPY TIME:  Please see operative report for further detail.  COMPARISON:  MRCP 11/18/2014  FINDINGS: A total of 8 intraoperative spot films demonstrate a flexible endoscope in the descending duodenum followed by wire cannulation of the common bile duct and cholangiogram. Cholangiogram demonstrates several rounded filling defects in the distal common bile duct consistent with choledocholithiasis. The images demonstrate balloon sweep of the common bile duct. On the final image, no further filling defects are identified.  IMPRESSION: Choledocholithiasis with sphincterotomy and balloon sweep of the common bile duct.  These images were submitted for radiologic interpretation only. Please see the procedural report for the amount of contrast and the fluoroscopy time utilized.   Electronically Signed   By: Malachy Moan M.D.   On: 11/20/2014 09:42    Micro Results      Recent Results (from the past 240 hour(s))  Urine culture     Status: None   Collection Time: 11/17/14 11:20 PM  Result  Value Ref Range Status   Specimen Description URINE, CATHETERIZED  Final   Special Requests NONE  Final   Culture >=100,000 COLONIES/mL PSEUDOMONAS AERUGINOSA  Final   Report Status 11/20/2014 FINAL  Final   Organism ID, Bacteria PSEUDOMONAS AERUGINOSA  Final      Susceptibility   Pseudomonas aeruginosa - MIC*    CEFTAZIDIME 8 SENSITIVE Sensitive     CIPROFLOXACIN <=0.25 SENSITIVE Sensitive     GENTAMICIN 2 SENSITIVE Sensitive     IMIPENEM 2 SENSITIVE Sensitive     PIP/TAZO 16 SENSITIVE Sensitive     CEFEPIME 4 SENSITIVE Sensitive     * >=100,000 COLONIES/mL PSEUDOMONAS AERUGINOSA  Blood culture (routine x 2)     Status: None   Collection Time: 11/18/14 12:37 AM  Result Value Ref Range Status   Specimen Description BLOOD RIGHT WRIST  Final   Special Requests BOTTLES DRAWN AEROBIC AND ANAEROBIC 5CC  Final   Culture  Setup Time   Final    GRAM NEGATIVE RODS AEROBIC BOTTLE ONLY CRITICAL RESULT CALLED TO, READ BACK BY AND VERIFIED WITH: L HITT,RN 07.24.16 2015 M SHIPMAN CONFIRMED BY M CAMPBELL    Culture   Final    PSEUDOMONAS AERUGINOSA SUSCEPTIBILITIES PERFORMED ON PREVIOUS CULTURE WITHIN THE LAST 5 DAYS.    Report Status 11/21/2014 FINAL  Final  Blood culture (routine x 2)     Status: None   Collection Time: 11/18/14 12:38 AM  Result Value Ref Range Status   Specimen Description BLOOD LEFT ARM  Final   Special Requests BOTTLES DRAWN AEROBIC AND ANAEROBIC 5CC  Final   Culture  Setup Time   Final    GRAM NEGATIVE RODS AEROBIC BOTTLE ONLY CRITICAL RESULT CALLED TO, READ BACK  BY AND VERIFIED WITH: HITT,L RN 11/18/14 2015 WOOTEN,K CONFIRMED BY K BARR    Culture PSEUDOMONAS AERUGINOSA  Final   Report Status 11/21/2014 FINAL  Final   Organism ID, Bacteria PSEUDOMONAS AERUGINOSA  Final      Susceptibility   Pseudomonas aeruginosa - MIC*    CEFTAZIDIME 4 SENSITIVE Sensitive     CIPROFLOXACIN <=0.25 SENSITIVE Sensitive     GENTAMICIN 2 SENSITIVE Sensitive     IMIPENEM 0.5  SENSITIVE Sensitive     PIP/TAZO 8 SENSITIVE Sensitive     CEFEPIME 2 SENSITIVE Sensitive     * PSEUDOMONAS AERUGINOSA  MRSA PCR Screening     Status: None   Collection Time: 11/18/14  4:29 AM  Result Value Ref Range Status   MRSA by PCR NEGATIVE NEGATIVE Final    Comment:        The GeneXpert MRSA Assay (FDA approved for NASAL specimens only), is one component of a comprehensive MRSA colonization surveillance program. It is not intended to diagnose MRSA infection nor to guide or monitor treatment for MRSA infections.   Urine culture     Status: None   Collection Time: 11/18/14  3:10 PM  Result Value Ref Range Status   Specimen Description URINE, RANDOM  Final   Special Requests NONE  Final   Culture MULTIPLE SPECIES PRESENT, SUGGEST RECOLLECTION  Final   Report Status 11/20/2014 FINAL  Final  Culture, blood (routine x 2)     Status: None (Preliminary result)   Collection Time: 11/21/14  1:05 PM  Result Value Ref Range Status   Specimen Description BLOOD RIGHT ANTECUBITAL  Final   Special Requests BOTTLES DRAWN AEROBIC AND ANAEROBIC 10CC  Final   Culture NO GROWTH 1 DAY  Final   Report Status PENDING  Incomplete  Culture, blood (routine x 2)     Status: None (Preliminary result)   Collection Time: 11/21/14  1:11 PM  Result Value Ref Range Status   Specimen Description BLOOD RIGHT ARM  Final   Special Requests BOTTLES DRAWN AEROBIC AND ANAEROBIC 5CC  Final   Culture NO GROWTH 1 DAY  Final   Report Status PENDING  Incomplete       Today   Subjective    [name] today has no headache,no chest abdominal pain,no new weakness tingling or numbness, feels much better.  Objective   Blood pressure 123/64, pulse 81, temperature 97.7 F (36.5 C), temperature source Oral, resp. rate 18, height 6' (1.829 m), weight 93 kg (205 lb 0.4 oz), SpO2 98 %.   Intake/Output Summary (Last 24 hours) at 11/23/14 1034 Last data filed at 11/23/14 0950  Gross per 24 hour  Intake     500 ml  Output    600 ml  Net   -100 ml    Exam Awake Alert, Oriented x 3, No new F.N deficits, Normal affect Eugenio Saenz.AT,PERRAL Supple Neck,No JVD, No cervical lymphadenopathy appriciated.  Symmetrical Chest wall movement, Good air movement bilaterally, CTAB iRRR,No Gallops,Rubs or new Murmurs, No Parasternal Heave +ve B.Sounds, Abd Soft, Non tender, No organomegaly appriciated, No rebound -guarding or rigidity. No Cyanosis, Clubbing or edema, No new Rash or bruise   Data Review   CBC w Diff:  Lab Results  Component Value Date   WBC 8.4 11/21/2014   HGB 9.2* 11/21/2014   HCT 27.5* 11/21/2014   PLT 170 11/21/2014   LYMPHOPCT 1* 11/18/2014   MONOPCT 6 11/18/2014   EOSPCT 0 11/18/2014   BASOPCT 0 11/18/2014  CMP:  Lab Results  Component Value Date   NA 134* 11/22/2014   K 4.3 11/22/2014   CL 102 11/22/2014   CO2 25 11/22/2014   BUN 30* 11/22/2014   CREATININE 1.16* 11/22/2014   PROT 5.5* 11/22/2014   ALBUMIN 2.8* 11/22/2014   BILITOT 1.3* 11/22/2014   ALKPHOS 146* 11/22/2014   AST 20 11/22/2014   ALT 82* 11/22/2014  .   Total Time in preparing paper work, data evaluation and todays exam - 35 minutes  Leroy Sea M.D on 11/23/2014 at 10:34 AM  [facility]   Office  [phone]

## 2014-11-26 LAB — CULTURE, BLOOD (ROUTINE X 2)
CULTURE: NO GROWTH
Culture: NO GROWTH

## 2014-12-03 ENCOUNTER — Ambulatory Visit (INDEPENDENT_AMBULATORY_CARE_PROVIDER_SITE_OTHER): Payer: Medicare Other | Admitting: Gastroenterology

## 2014-12-03 ENCOUNTER — Encounter: Payer: Self-pay | Admitting: Gastroenterology

## 2014-12-03 ENCOUNTER — Other Ambulatory Visit (INDEPENDENT_AMBULATORY_CARE_PROVIDER_SITE_OTHER): Payer: Medicare Other

## 2014-12-03 VITALS — BP 112/56 | Ht 69.0 in | Wt 197.4 lb

## 2014-12-03 DIAGNOSIS — R945 Abnormal results of liver function studies: Secondary | ICD-10-CM

## 2014-12-03 DIAGNOSIS — K7689 Other specified diseases of liver: Secondary | ICD-10-CM

## 2014-12-03 LAB — CBC WITH DIFFERENTIAL/PLATELET
BASOS PCT: 0 % (ref 0.0–3.0)
Basophils Absolute: 0 10*3/uL (ref 0.0–0.1)
EOS ABS: 0.1 10*3/uL (ref 0.0–0.7)
Eosinophils Relative: 0.5 % (ref 0.0–5.0)
HCT: 32.6 % — ABNORMAL LOW (ref 36.0–46.0)
HEMOGLOBIN: 10.6 g/dL — AB (ref 12.0–15.0)
LYMPHS ABS: 0.8 10*3/uL (ref 0.7–4.0)
LYMPHS PCT: 4.5 % — AB (ref 12.0–46.0)
MCHC: 32.6 g/dL (ref 30.0–36.0)
MCV: 89 fl (ref 78.0–100.0)
MONO ABS: 1 10*3/uL (ref 0.1–1.0)
Monocytes Relative: 5.7 % (ref 3.0–12.0)
NEUTROS ABS: 16.2 10*3/uL — AB (ref 1.4–7.7)
Neutrophils Relative %: 89.3 % — ABNORMAL HIGH (ref 43.0–77.0)
Platelets: 229 10*3/uL (ref 150.0–400.0)
RBC: 3.66 Mil/uL — ABNORMAL LOW (ref 3.87–5.11)
RDW: 15.4 % (ref 11.5–15.5)

## 2014-12-03 LAB — COMPREHENSIVE METABOLIC PANEL
ALT: 38 U/L — ABNORMAL HIGH (ref 0–35)
AST: 13 U/L (ref 0–37)
Albumin: 3.3 g/dL — ABNORMAL LOW (ref 3.5–5.2)
Alkaline Phosphatase: 89 U/L (ref 39–117)
BUN: 29 mg/dL — ABNORMAL HIGH (ref 6–23)
CO2: 36 meq/L — AB (ref 19–32)
Calcium: 9.6 mg/dL (ref 8.4–10.5)
Chloride: 98 mEq/L (ref 96–112)
Creatinine, Ser: 0.97 mg/dL (ref 0.40–1.20)
GFR: 58.25 mL/min — AB (ref >60.00)
Glucose, Bld: 129 mg/dL — ABNORMAL HIGH (ref 70–99)
Potassium: 3.5 mEq/L (ref 3.5–5.1)
Sodium: 140 mEq/L (ref 135–145)
Total Bilirubin: 0.8 mg/dL (ref 0.2–1.2)
Total Protein: 5.7 g/dL — ABNORMAL LOW (ref 6.0–8.3)

## 2014-12-03 NOTE — Progress Notes (Signed)
HPI:  Primary GI: Dr. Arlyce Dice  This is a very pleasant 81 year old woman who has significant comorbidities whom I'm meeting for the first time today  Chief complaint is recent ERCP  She presented last month to the hospital with fever, white count, elevated liver tests and imaging as described below. She grew gram-negative rods in her blood. These were found to be Pseudomonas. She also grew pseudomonas in her urine.  MRI 10/2014: Minimal periportal edema and central mild intrahepatic ductaldilatation. No hepatic mass is identified. Evaluation is limited bypatient motion and lack of ability to administer contrast. There isa persisting question of soft tissue mass within the porta hepatisinferior to the portal vein which could be a lymph node or massproducing minimal central obstruction. Other etiologies couldinclude stricture, occult stone, or bile duct mass not visualized.Groove pancreatitis is felt less likely because the patientreportedly has normal pancreatic enzymes at recent lab values.No gross evidence for pancreatic mass.  CT scan 10/2014:Ill-defined soft tissue thickness involving the head and uncinateprocess of the pancreas as well as the medial wall of the secondportion of the duodenum. MRI or endoscopic ultrasound is recommendedfor further evaluation  ERCP DR. STark 10/2014: 1. Sublte smooth stenosis in the proximal common bile duct; brush cytology obtained 2. Prior small sphincterotomy noted; biliary sphincterotomy extended  3. Filling defects in CBD appeared to be air bubbles. No stent was placed. Brushing cytology; "no malignant cells identified"   Her T bili mas was 5.0.  Had begun to improve even prior to the ERCP above.  Past Medical History  Diagnosis Date  . CAD (coronary artery disease)     a. s/p CABG x5 (2005) b. s/p BMS-prox SVG-D2 (2014)  . Hypertension   . Hyperlipidemia   . GI bleed 2008    AVMs  . Aneurysm, aortic     thoracic aorta, stable at 4.1 cm,  chest CT, May, 2012  . Syncope     Nitroglycerin plus a diuretic, April, 2009  . Hyponatremia     Chronic. Felt secondary to SIADH   . COPD (chronic obstructive pulmonary disease)     on home oxygen  . Tobacco abuse   . Bradycardia   . Carotid artery disease     Hx of endarterectomy. Doppler October, 2011, stable, 0-39% RIC A., 40-59% LICA  . Tuberculosis     Exposures to tuberculosis 1970s, has tested negative by the health Department  . Atrial fibrillation     Paroxysmal with RVR 07/2011, not felt to be a coumadin candidate secondary to hx of GIB/AVM  . Venous stasis of lower extremity     Chronic  . CHF with left ventricular diastolic dysfunction, NYHA class 2     a. EF 55-60%, echo, April, 2013 b. EF 55-60%, mild biatrial enlargement and PASP 42 mmH  . AAA (abdominal aortic aneurysm)   . Complication of anesthesia   . Hx of CABG     CABG 2005  . Hematoma     Right groin hematoma, small, post cath, April, 2014  . Mitral regurgitation     Mild, hospital, March, 2014  . Leg ulcer     Ulcerated lesion on the anterior aspect of her left lower leg, June, 2014    Past Surgical History  Procedure Laterality Date  . Cholecystectomy    . Carotid endartercetomy    . Abdominal hysterectomy    . Coronary artery bypass graft  2005  . Coronary angioplasty with stent placement  07/18/12    severe  native coronary artery disease, 99% proximal stenosis of the SVG-D2 status post successful PTCA/PCI with a Veriflex bare-metal stent, widely patent SVGs to both the right PDA sequential OM1 to OM 2, EF 65-70% and 3+ mitral regurgitation  . Left heart catheterization with coronary angiogram N/A 07/18/2012    Procedure: LEFT HEART CATHETERIZATION WITH CORONARY ANGIOGRAM;  Surgeon: Marykay Lex, MD;  Location: [facility] CATH LAB;  Service: Cardiovascular;  Laterality: N/A;  . Percutaneous coronary stent intervention (pci-s)  07/18/2012    Procedure: PERCUTANEOUS CORONARY STENT INTERVENTION (PCI-S);   Surgeon: Marykay Lex, MD;  Location: [facility] CATH LAB;  Service: Cardiovascular;;  . Ercp N/A 11/20/2014    Procedure: ENDOSCOPIC RETROGRADE CHOLANGIOPANCREATOGRAPHY (ERCP);  Surgeon: Meryl Dare, MD;  Location: [facility] ENDOSCOPY;  Service: Endoscopy;  Laterality: N/A;    Current Outpatient Prescriptions  Medication Sig Dispense Refill  . albuterol (PROVENTIL HFA;VENTOLIN HFA) 108 (90 BASE) MCG/ACT inhaler Inhale 2 puffs into the lungs every 4 (four) hours as needed for wheezing or shortness of breath. 1 Inhaler 5  . albuterol (PROVENTIL) (2.5 MG/3ML) 0.083% nebulizer solution Take 2.5 mg by nebulization every 6 (six) hours as needed for wheezing.    Marland Kitchen ALPRAZolam (XANAX) 0.25 MG tablet Take 1 tablet (0.25 mg total) by mouth at bedtime as needed for anxiety. 10 tablet 0  . aspirin EC 81 MG tablet Take 81 mg by mouth daily.    Marland Kitchen atorvastatin (LIPITOR) 40 MG tablet Take 0.5 tablets (20 mg total) by mouth daily at 6 PM. 90 tablet 1  . diltiazem (CARDIZEM) 120 MG tablet Take 1 tablet (120 mg total) by mouth every 8 (eight) hours.    . furosemide (LASIX) 40 MG tablet Take 1 tablet (40 mg total) by mouth 2 (two) times daily. 180 tablet 1  . insulin aspart (NOVOLOG) 100 UNIT/ML injection Before each meal 3 times a day, 140-199 - 2 units, 200-250 - 4 units, 251-299 - 6 units,  300-349 - 8 units,  350 or above 10 units. Dispense syringes and needles as needed, Ok to switch to PEN if approved. Substitute to any brand approved. DX DM2, Code E11.65 1 vial 12  . insulin glargine (LANTUS) 100 UNIT/ML injection Inject 0.15 mLs (15 Units total) into the skin daily. 10 mL 11  . magnesium oxide (MAG-OX) 400 MG tablet Take 400 mg by mouth daily.    . metoprolol succinate (TOPROL-XL) 25 MG 24 hr tablet Take 1 tablet (25 mg total) by mouth daily. 90 tablet 0  . nitroGLYCERIN (NITROSTAT) 0.4 MG SL tablet Place 0.4 mg under the tongue every 5 (five) minutes as needed for chest pain.    Marland Kitchen ondansetron (ZOFRAN) 8 MG tablet  Take 1 tablet (8 mg total) by mouth every 8 (eight) hours as needed for nausea or vomiting. 20 tablet 0  . OXYGEN Inhale into the lungs. 2L continously    . pantoprazole (PROTONIX) 40 MG tablet Take 40 mg by mouth daily.    . polyethylene glycol (MIRALAX / GLYCOLAX) packet Take 17 g by mouth daily.    . potassium chloride SA (K-DUR,KLOR-CON) 20 MEQ tablet Take 20 mEq by mouth 2 (two) times daily.    Marland Kitchen senna (SENOKOT) 8.6 MG TABS tablet Take 1 tablet by mouth daily.    . SYMBICORT 160-4.5 MCG/ACT inhaler USE 2 INHALATIONS TWICE A DAY 30.6 g 0  . Tiotropium Bromide Monohydrate (SPIRIVA RESPIMAT) 2.5 MCG/ACT AERS Inhale 2 puffs into the lungs daily. 4 g 5  . Vitamin  D, Ergocalciferol, (DRISDOL) 50000 UNITS CAPS Take 50,000 Units by mouth every 14 (fourteen) days. Every other Sunday     No current facility-administered medications for this visit.    Allergies as of 12/03/2014 - Review Complete 12/03/2014  Allergen Reaction Noted  . Other Other (See Comments) 04/28/2013  . Codeine Nausea And Vomiting   . Sulfonamide derivatives Nausea And Vomiting   . Warfarin  11/20/2013  . Penicillins Itching, Rash, and Other (See Comments)   . Ranitidine hcl Itching and Rash     Family History  Problem Relation Age of Onset  . Stroke Sister   . Heart failure Mother   . Cancer Sister     Breast and Lung   . Cancer Brother     breast cancer    History   Social History  . Marital Status: Widowed    Spouse Name: N/A  . Number of Children: 1  . Years of Education: N/A   Occupational History  . retired     [employer]   Social History Main Topics  . Smoking status: Former Smoker -- 0.50 packs/day for 59 years    Types: Cigarettes    Quit date: 02/01/2013  . Smokeless tobacco: Not on file  . Alcohol Use: No  . Drug Use: No  . Sexual Activity: Not on file   Other Topics Concern  . Not on file   Social History Narrative     Physical Exam: BP 112/56 mmHg  Ht  (1.753 m)  Wt 197 lb 6  oz (89.529 kg)  BMI 29.13 kg/m2 Constitutional: Chronically ill-appearing, sitting in a wheelchair, breathing with nasal cannula oxygen Psychiatric: alert and oriented x3 Abdomen: soft, nontender, nondistended, no obvious ascites, no peritoneal signs, normal bowel sounds   Assessment and plan: 81 y.o. female with recent ERCP for elevated liver tests, abnormal biliary imaging  In retrospect it looks like her acute illness last month was from urinary tract infection. She grew pseudomonas in her urine and also Pseudomonas in her blood. She did have what was described as a subtle proximal bile duct narrowing but it was not significant enough to require stenting. Cytology was negative. Her liver tests that are improved even before that ERCP was done however they were not yet completely normal. Perhaps her liver elevation was more of a systemic sign of her urosepsis. I do not think she needs any further biliary workup at this point and in fact I don't think she is well enough to undergo that unless it is an emergency. She can take only a few steps without a walker. She is on constant nasal cannula oxygen and today she just tells me she is very fatigued. She will get a basic set of labs today including a CBC, complete metabolic profile and we will let her know the how those are.   Rob Bunting, MD [facility] 12/03/2014, 10:32 AM

## 2014-12-03 NOTE — Patient Instructions (Addendum)
Will follow liver tests periodically (cmet, cbc today). For now, no further testing.

## 2014-12-26 ENCOUNTER — Encounter: Payer: Self-pay | Admitting: Cardiology

## 2014-12-26 ENCOUNTER — Ambulatory Visit (INDEPENDENT_AMBULATORY_CARE_PROVIDER_SITE_OTHER): Payer: Medicare Other | Admitting: Cardiology

## 2014-12-26 VITALS — BP 110/54 | HR 111 | Ht 69.0 in | Wt 181.0 lb

## 2014-12-26 DIAGNOSIS — I5032 Chronic diastolic (congestive) heart failure: Secondary | ICD-10-CM

## 2014-12-26 DIAGNOSIS — I10 Essential (primary) hypertension: Secondary | ICD-10-CM

## 2014-12-26 DIAGNOSIS — I25810 Atherosclerosis of coronary artery bypass graft(s) without angina pectoris: Secondary | ICD-10-CM | POA: Diagnosis not present

## 2014-12-26 DIAGNOSIS — I482 Chronic atrial fibrillation, unspecified: Secondary | ICD-10-CM

## 2014-12-26 DIAGNOSIS — I4891 Unspecified atrial fibrillation: Secondary | ICD-10-CM

## 2014-12-26 DIAGNOSIS — J42 Unspecified chronic bronchitis: Secondary | ICD-10-CM

## 2014-12-26 LAB — HEPATIC FUNCTION PANEL
ALK PHOS: 71 U/L (ref 39–117)
ALT: 24 U/L (ref 0–35)
AST: 16 U/L (ref 0–37)
Albumin: 3.6 g/dL (ref 3.5–5.2)
BILIRUBIN DIRECT: 0.1 mg/dL (ref 0.0–0.3)
BILIRUBIN TOTAL: 0.6 mg/dL (ref 0.2–1.2)
TOTAL PROTEIN: 6.1 g/dL (ref 6.0–8.3)

## 2014-12-26 LAB — CBC
HCT: 35.3 % — ABNORMAL LOW (ref 36.0–46.0)
Hemoglobin: 11.6 g/dL — ABNORMAL LOW (ref 12.0–15.0)
MCHC: 33 g/dL (ref 30.0–36.0)
MCV: 88.1 fl (ref 78.0–100.0)
Platelets: 257 10*3/uL (ref 150.0–400.0)
RBC: 4 Mil/uL (ref 3.87–5.11)
RDW: 15.4 % (ref 11.5–15.5)
WBC: 17.2 10*3/uL — ABNORMAL HIGH (ref 4.0–10.5)

## 2014-12-26 LAB — BASIC METABOLIC PANEL
BUN: 38 mg/dL — ABNORMAL HIGH (ref 6–23)
CALCIUM: 10 mg/dL (ref 8.4–10.5)
CHLORIDE: 99 meq/L (ref 96–112)
CO2: 34 meq/L — AB (ref 19–32)
Creatinine, Ser: 1.28 mg/dL — ABNORMAL HIGH (ref 0.40–1.20)
GFR: 42.29 mL/min — ABNORMAL LOW (ref >60.00)
Glucose, Bld: 122 mg/dL — ABNORMAL HIGH (ref 70–99)
Potassium: 3.9 mEq/L (ref 3.5–5.1)
SODIUM: 140 meq/L (ref 135–145)

## 2014-12-26 MED ORDER — METOPROLOL SUCCINATE ER 25 MG PO TB24: 25.0000 mg | ORAL_TABLET | Freq: Every day | ORAL | Status: DC

## 2014-12-26 MED ORDER — FUROSEMIDE 40 MG PO TABS: 40.0000 mg | ORAL_TABLET | Freq: Two times a day (BID) | ORAL | Status: DC

## 2014-12-26 MED ORDER — PANTOPRAZOLE SODIUM 40 MG PO TBEC: 40.0000 mg | DELAYED_RELEASE_TABLET | Freq: Every day | ORAL | Status: AC

## 2014-12-26 MED ORDER — CARTIA XT 300 MG PO CP24: 300.0000 mg | ORAL_CAPSULE | Freq: Every day | ORAL | Status: DC

## 2014-12-26 NOTE — Progress Notes (Signed)
Cardiology Office Note   Date:  12/26/2014   ID:  [name], DOB July 27, 1929, MRN [id number]  PCP:  Garlan Fillers, MD  Cardiologist:  Dr. Myrtis Ser    Chief Complaint  Patient presents with  . Hospitalization Follow-up    a fib with RVR,       History of Present Illness: [name] is a 81 y.o. female who presents for post hospital visit for sepsis,AMS, severe-copd, elevated LFTs and rapid VR of her chronic a fib.  Pt recovered and has been in SNF until 2 weeks ago.  Now at home her grandson stays with her and family members stay on the property.   With hypotension in hospital her cardizem was changed to 120 every 8 hours.   She is not anticoagulation candidate due to GI bleed in the past.  Cardiac hx with a fib, chronic , CAD with CABG in 2005 with LIMA->LAD, rSVG->diag, seq r SVG -> 1st OM and distal LCX, rSVG->PDA. Last cath  06/2012:  Severe native coronary disease as described.  Severe proximal stenosis of the SVG-D2: Subtotal, 99% occlusion  Successful PTCA/PCI of the possible SVG-D2 with a Veriflex BMS 3.0 mm x 20 mm, postdilated to 3.4 mm  Widely patent SVGs to both the RPDA and sequential to OM1-OM 2  Well-preserved EF by LV gram however there is evidence of significant MR.  Last echo 10/2014  Study Conclusions - Left ventricle: The cavity size was normal. Wall thickness was increased in a pattern of mild LVH. Systolic function was normal. The estimated ejection fraction was in the range of 55% to 60%. Regional wall motion abnormalities cannot be excluded. - Mitral valve: There was mild regurgitation. - Left atrium: The atrium was moderately dilated. - Right atrium: The atrium was mildly dilated. - Pulmonary arteries: PA peak pressure: 35 mm Hg (S).  Pt poor historian and poor general memory.  The memory issues began prior to admit. She denies chest pain and her SOB is at baseline.  No longer on steroids and family checks her glucose daily.  She has  not yet followed up with Dr. Jeannetta Nap her PCP .    Past Medical History  Diagnosis Date  . CAD (coronary artery disease)     a. s/p CABG x5 (2005) b. s/p BMS-prox SVG-D2 (2014)  . Hypertension   . Hyperlipidemia   . GI bleed 2008    AVMs  . Aneurysm, aortic     thoracic aorta, stable at 4.1 cm, chest CT, May, 2012  . Syncope     Nitroglycerin plus a diuretic, April, 2009  . Hyponatremia     Chronic. Felt secondary to SIADH   . COPD (chronic obstructive pulmonary disease)     on home oxygen  . Tobacco abuse   . Bradycardia   . Carotid artery disease     Hx of endarterectomy. Doppler October, 2011, stable, 0-39% RIC A., 40-59% LICA  . Tuberculosis     Exposures to tuberculosis 1970s, has tested negative by the health Department  . Atrial fibrillation     Paroxysmal with RVR 07/2011, not felt to be a coumadin candidate secondary to hx of GIB/AVM  . Venous stasis of lower extremity     Chronic  . CHF with left ventricular diastolic dysfunction, NYHA class 2     a. EF 55-60%, echo, April, 2013 b. EF 55-60%, mild biatrial enlargement and PASP 42 mmH  . AAA (abdominal aortic aneurysm)   . Complication of  anesthesia   . Hx of CABG     CABG 2005  . Hematoma     Right groin hematoma, small, post cath, April, 2014  . Mitral regurgitation     Mild, hospital, March, 2014  . Leg ulcer     Ulcerated lesion on the anterior aspect of her left lower leg, June, 2014    Past Surgical History  Procedure Laterality Date  . Cholecystectomy    . Carotid endartercetomy    . Abdominal hysterectomy    . Coronary artery bypass graft  2005  . Coronary angioplasty with stent placement  07/18/12    severe native coronary artery disease, 99% proximal stenosis of the SVG-D2 status post successful PTCA/PCI with a Veriflex bare-metal stent, widely patent SVGs to both the right PDA sequential OM1 to OM 2, EF 65-70% and 3+ mitral regurgitation  . Left heart catheterization with coronary angiogram N/A  07/18/2012    Procedure: LEFT HEART CATHETERIZATION WITH CORONARY ANGIOGRAM;  Surgeon: Marykay Lex, MD;  Location: [facility] CATH LAB;  Service: Cardiovascular;  Laterality: N/A;  . Percutaneous coronary stent intervention (pci-s)  07/18/2012    Procedure: PERCUTANEOUS CORONARY STENT INTERVENTION (PCI-S);  Surgeon: Marykay Lex, MD;  Location: [facility] CATH LAB;  Service: Cardiovascular;;  . Ercp N/A 11/20/2014    Procedure: ENDOSCOPIC RETROGRADE CHOLANGIOPANCREATOGRAPHY (ERCP);  Surgeon: Meryl Dare, MD;  Location: [facility] ENDOSCOPY;  Service: Endoscopy;  Laterality: N/A;     Current Outpatient Prescriptions  Medication Sig Dispense Refill  . albuterol (PROVENTIL HFA;VENTOLIN HFA) 108 (90 BASE) MCG/ACT inhaler Inhale 2 puffs into the lungs every 4 (four) hours as needed for wheezing or shortness of breath. 1 Inhaler 5  . albuterol (PROVENTIL) (2.5 MG/3ML) 0.083% nebulizer solution Take 2.5 mg by nebulization every 6 (six) hours as needed for wheezing.    Marland Kitchen ALPRAZolam (XANAX) 0.25 MG tablet Take 1 tablet (0.25 mg total) by mouth at bedtime as needed for anxiety. 10 tablet 0  . aspirin EC 81 MG tablet Take 81 mg by mouth daily.    Marland Kitchen atorvastatin (LIPITOR) 40 MG tablet Take 0.5 tablets (20 mg total) by mouth daily at 6 PM. 90 tablet 1  . furosemide (LASIX) 40 MG tablet Take 1 tablet (40 mg total) by mouth 2 (two) times daily. 180 tablet 1  . insulin aspart (NOVOLOG) 100 UNIT/ML injection Before each meal 3 times a day, 140-199 - 2 units, 200-250 - 4 units, 251-299 - 6 units,  300-349 - 8 units,  350 or above 10 units. Dispense syringes and needles as needed, Ok to switch to PEN if approved. Substitute to any brand approved. DX DM2, Code E11.65 1 vial 12  . insulin glargine (LANTUS) 100 UNIT/ML injection Inject 0.15 mLs (15 Units total) into the skin daily. 10 mL 11  . magnesium oxide (MAG-OX) 400 MG tablet Take 400 mg by mouth daily.    . metoprolol succinate (TOPROL-XL) 25 MG 24 hr tablet Take 1 tablet (25  mg total) by mouth daily. 90 tablet 0  . nitroGLYCERIN (NITROSTAT) 0.4 MG SL tablet Place 0.4 mg under the tongue every 5 (five) minutes as needed for chest pain.    Marland Kitchen ondansetron (ZOFRAN) 8 MG tablet Take 1 tablet (8 mg total) by mouth every 8 (eight) hours as needed for nausea or vomiting. 20 tablet 0  . OXYGEN Inhale into the lungs. 2L continously    . pantoprazole (PROTONIX) 40 MG tablet Take 40 mg by mouth daily.    Marland Kitchen  polyethylene glycol (MIRALAX / GLYCOLAX) packet Take 17 g by mouth daily.    . potassium chloride SA (K-DUR,KLOR-CON) 20 MEQ tablet Take 20 mEq by mouth 2 (two) times daily.    Marland Kitchen senna (SENOKOT) 8.6 MG TABS tablet Take 1 tablet by mouth daily.    Marland Kitchen SPIRIVA HANDIHALER 18 MCG inhalation capsule     . SYMBICORT 160-4.5 MCG/ACT inhaler USE 2 INHALATIONS TWICE A DAY 30.6 g 0  . Tiotropium Bromide Monohydrate (SPIRIVA RESPIMAT) 2.5 MCG/ACT AERS Inhale 2 puffs into the lungs daily. 4 g 5  . Vitamin D, Ergocalciferol, (DRISDOL) 50000 UNITS CAPS Take 50,000 Units by mouth every 14 (fourteen) days. Every other Sunday    . CARTIA XT 300 MG 24 hr capsule Take 300 mg by mouth daily.      No current facility-administered medications for this visit.    Allergies:   Other; Codeine; Sulfonamide derivatives; Warfarin; Penicillins; and Ranitidine hcl    Social History:  The patient  reports that she quit smoking about 22 months ago. Her smoking use included Cigarettes. She has a 29.5 pack-year smoking history. She does not have any smokeless tobacco history on file. She reports that she does not drink alcohol or use illicit drugs.   Family History:  The patient's family history includes Cancer in her brother and sister; Heart failure in her mother; Stroke in her sister. There is no history of Heart attack or Hypertension.    ROS:  General:no colds or fevers,  weight has decreased since discharge, but family states she is eating well. Skin:no rashes or ulcers HEENT:no blurred vision, no  congestion CV:see HPI PUL:see HPI GI:no diarrhea constipation or melena, no indigestion GU:no hematuria, no dysuria MS:no joint pain, no claudication Neuro:no syncope, no lightheadedness Endo:+ diabetes, no thyroid disease  Wt Readings from Last 3 Encounters:  12/26/14 181 lb (82.101 kg)  12/03/14 197 lb 6 oz (89.529 kg)  11/23/14 205 lb 0.4 oz (93 kg)     PHYSICAL EXAM: VS:  BP 110/54 mmHg  Pulse 111  Ht  (1.753 m)  Wt 181 lb (82.101 kg)  BMI 26.72 kg/m2  SpO2 95% , BMI Body mass index is 26.72 kg/(m^2). General:Pleasant affect, NAD Skin:Warm and dry, brisk capillary refill HEENT:normocephalic, sclera clear, mucus membranes moist Neck:supple, no JVD, no bruits  Heart:irreg irreg without murmur, gallup, rub or click Lungs:clear without rales, rhonchi, or wheezes WUJ:WJXB, non tender, + BS, do not palpate liver spleen or masses Ext:no lower ext edema,  2+ radial pulses, scars on legs. Neuro:alert and oriented to year and person, not month or year, family stated she repeats same questions freq.   MAE, follows commands, + facial symmetry    EKG:  EKG is ordered today. The ekg ordered today demonstrates a fib with RVR at 111, though after pt rested her HR was < 100.   Recent Labs: 02/01/2014: Pro B Natriuretic peptide (BNP) 490.9* 11/17/2014: B Natriuretic Peptide 179.0* 11/18/2014: TSH 0.702 11/20/2014: Magnesium 2.4 12/03/2014: ALT 38*; BUN 29*; Creatinine, Ser 0.97; Hemoglobin 10.6*; Platelets 229.0; Potassium 3.5; Sodium 140    Lipid Panel    Component Value Date/Time   CHOL 121 07/19/2012 0256   TRIG 102 07/19/2012 0256   HDL 39* 07/19/2012 0256   CHOLHDL 3.1 07/19/2012 0256   VLDL 20 07/19/2012 0256   LDLCALC 62 07/19/2012 0256       Other studies Reviewed: Additional studies/ records that were reviewed today include: Echo, cath, CABG..   ASSESSMENT AND  PLAN:  1. Atrial fibrillation chronic in RVR upon admission. [place] Vasc of greater than 3.Not on  anticoagulation due to previous GI bleed.  Now in rate control  On dilt 120 mg every 8 hours.  Continue Toprol-XL.  Rate up some with exertion but at rest < 100. Will check CMP, cbc.  She will follow up in 2 months with Dr. Delton See as Dr. Myrtis Ser is retiring.    2 Chronic diastolic CHF last EF of 55%- 605 on recent echo Clinically compensated from CHF standpoint,   3.  CAD with hx CABG 2005, and 2014 PCI with BMS to VG-OM2.  chest pain-free,  Continue aspirin, beta blocker and statin for secondary prevention.   4. HTN controlled to borderline for this pt.   5. Severe COPD and chronic resp. Failure. Followed by Dr. Vassie Loll  6. DM-2 follow up with Dr. Jeannetta Nap.   7. Memory issues, follow up with PCP.    Current medicines are reviewed with the patient today.  The patient Has no concerns regarding medicines.  The following changes have been made:  See above Labs/ tests ordered today include:see above  Disposition:   FU:  see above  Nyoka Lint, NP  12/26/2014 2:31 PM    [facility] [address] [place], [place], [place]  [address]/ [address] [place], [place] Phone: [phone]; Fax: [phone]  [phone]

## 2014-12-26 NOTE — Patient Instructions (Signed)
Medication Instructions:  Your physician recommends that you continue on your current medications as directed. Please refer to the Current Medication list given to you today.   Labwork: Lab work to be done today--BMP, Liver profile, CBC  Testing/Procedures: none  Follow-Up: Your physician recommends that you schedule a follow-up appointment in: 2 months with Dr. Delton See  See Dr. Shelah Lewandowsky in one week.

## 2014-12-28 ENCOUNTER — Telehealth: Payer: Self-pay

## 2014-12-28 MED ORDER — FUROSEMIDE 40 MG PO TABS: 40.0000 mg | ORAL_TABLET | Freq: Every day | ORAL | Status: DC

## 2014-12-28 NOTE — Telephone Encounter (Signed)
Called patient's daughter-in-law [name] [facility]) with lab results. Per Karen Boozer NP, Decrease lasix to once a day and follow up with pulmonary -her white count remains elevated. Repeat CXR, but see pulmonary Dr. Vassie Loll this week or first of next we may need to make the appt. The pt has memory issues so we need to notify family for instructions not pt. [name] stated that she would call and get an appointment with Dr. Vassie Loll for patient to see ASAP and will see about getting a repeat CXR with him. [name] understands instructions and informed the office that patient's PCP [name] discontinued patient's Cartia XT 300 mg due to low BP. Will forward to [name] to let her know about the change.

## 2015-01-01 ENCOUNTER — Encounter: Payer: Self-pay | Admitting: Pulmonary Disease

## 2015-01-01 ENCOUNTER — Ambulatory Visit (INDEPENDENT_AMBULATORY_CARE_PROVIDER_SITE_OTHER): Payer: Medicare Other | Admitting: Pulmonary Disease

## 2015-01-01 VITALS — BP 104/62 | HR 109 | Ht 69.0 in | Wt 181.0 lb

## 2015-01-01 DIAGNOSIS — J441 Chronic obstructive pulmonary disease with (acute) exacerbation: Secondary | ICD-10-CM

## 2015-01-01 DIAGNOSIS — J9611 Chronic respiratory failure with hypoxia: Secondary | ICD-10-CM | POA: Diagnosis not present

## 2015-01-01 MED ORDER — DILTIAZEM HCL ER COATED BEADS 120 MG PO CP24: 120.0000 mg | ORAL_CAPSULE | Freq: Every day | ORAL | Status: DC

## 2015-01-01 MED ORDER — PREDNISONE 5 MG PO TABS: ORAL_TABLET | ORAL | Status: DC

## 2015-01-01 NOTE — Progress Notes (Signed)
   Subjective:    Patient ID: [name], female    DOB: Dec 13, 1929, 81 y.o.   MRN: [id number]  HPI 85/F , ex-smoker presents for FU of gold D COPD, 2-3 admits/ yr.  She has been on home O2 since dec 2010 ([employer]). She smoked 1/2 PPD, about 40 Pyrs - . She sees Dr Myrtis Ser for CAD & has tolerated lisinopril & metoprolol, An episode of syncope in 2009 was attributed to NTG & diuretics.     01/01/2015  Chief Complaint  Patient presents with  . COPD    breathing is not doing well.  Wearing 2L pulse. no cough. Gets out of breath at the slightest movement    accompanied by granddaughter , who lives with her now , and great grandson   Adm 10/2014 UTI with pansensitive pseudomonas bacteremia  Elevated liver enzymes with questionable pancreatic mass on CT and MRI abdomen. Could have had mild cholangitis as she had leukocytosis and encephalopathy on admission caused by transient CBD sludge/stones which she passed, ERCP with brushing inconclusive, may require EUS later upper GI  She was dc'd to [facility] ds ,now on [facility]PT  she is maintained on 20 mg of prednisone since discharge-although this is not listed on her med list she hardly does much walking , only around the house   leukocytosis noted on labs  Chest x-ray 10/2014-interstitial edema, otherwise clear    Significant tests/ events  PFTs 08/19/09 >> severe airway obstruction, FEV1 47%, no BD response, air trapping +, severe decrease in diffusion  CT angio 5/12 asc aortic aneurysm 41mm, hepatic cysts   echo 10/2014 showed nml LV fn , RVSP 35  Hosp 06/2011 for New paroxysmal atrial fibrillation with RVR and COPD exacerbation  - not felt to be a coumadin candidate secondary to hx of GIB/AVMs   Hosp adm 07/2012 for stent, 09/2012 for LLE cellulitis & bleeding, ABI (demonstarted moderate Left lower extremity PAD)  LE dopplers (no DVT)   Admitted 01/2014 for AECOPD Admitted 04/2014  for CAP, sepsis , UTI        Review of  Systems neg for any significant sore throat, dysphagia, itching, sneezing, nasal congestion or excess/ purulent secretions, fever, chills, sweats, unintended wt loss, pleuritic or exertional cp, hempoptysis, orthopnea pnd or change in chronic leg swelling. Also denies presyncope, palpitations, heartburn, abdominal pain, nausea, vomiting, diarrhea or change in bowel or urinary habits, dysuria,hematuria, rash, arthralgias, visual complaints, headache, numbness weakness or ataxia.     Objective:   Physical Exam  Gen. Pleasant, well-nourished, in no distress ENT - no lesions, no post nasal drip Neck: No JVD, no thyromegaly, no carotid bruits Lungs: Kyphotic, no use of accessory muscles, no dullness to percussion, clear without rales or rhonchi  Cardiovascular: Rhythm regular, heart sounds  normal, no murmurs or gallops, no peripheral edema Musculoskeletal: No deformities, no cyanosis or clubbing        Assessment & Plan:

## 2015-01-01 NOTE — Telephone Encounter (Signed)
If pt's HR increases she should be seen,  She should take one of the cardizem 120 mg daily.  This was to keep her heart rate controlled.  How is BP now?

## 2015-01-01 NOTE — Assessment & Plan Note (Addendum)
Decrease prednisone to 10 mg daily in September On October 1, decrease to 5 mg daily Stay on symbicort , spiriva & oxygen Flu shot Leucocytosis ? Related to steroids - CXR was clear in 10/2014 & clear exam today

## 2015-01-01 NOTE — Telephone Encounter (Signed)
Called patient- BP 120/72 HR 92. Informed patient and her granddaughter of new prescription Cardizem 120 mg by mouth daily per Nada Boozer NP.  Patient verbalized understanding and informed the office that she has an appointment today with Dr. Vassie Loll.

## 2015-01-01 NOTE — Assessment & Plan Note (Signed)
Stay on O2

## 2015-01-01 NOTE — Patient Instructions (Signed)
Decrease prednisone to 10 mg daily in September On October 1, decrease to 5 mg daily Stay on symbicort , spiriva & oxygen

## 2015-01-25 ENCOUNTER — Other Ambulatory Visit: Payer: Self-pay | Admitting: *Deleted

## 2015-01-25 MED ORDER — DILTIAZEM HCL ER COATED BEADS 120 MG PO CP24: 120.0000 mg | ORAL_CAPSULE | Freq: Every day | ORAL | Status: DC

## 2015-01-29 ENCOUNTER — Other Ambulatory Visit: Payer: Self-pay | Admitting: *Deleted

## 2015-02-07 ENCOUNTER — Other Ambulatory Visit: Payer: Self-pay | Admitting: *Deleted

## 2015-02-07 MED ORDER — DILTIAZEM HCL ER COATED BEADS 120 MG PO CP24: 120.0000 mg | ORAL_CAPSULE | Freq: Every day | ORAL | Status: DC

## 2015-02-07 NOTE — Telephone Encounter (Signed)
Med was refilled under L. Annie ParasIngold, NP name.. Needs refill from MD. Rx(s) sent to pharmacy electronically.

## 2015-03-04 ENCOUNTER — Encounter: Payer: Self-pay | Admitting: Adult Health

## 2015-03-04 ENCOUNTER — Ambulatory Visit (INDEPENDENT_AMBULATORY_CARE_PROVIDER_SITE_OTHER): Payer: Medicare Other | Admitting: Adult Health

## 2015-03-04 VITALS — BP 135/74 | HR 98 | Temp 97.7°F | Ht 66.0 in | Wt 179.4 lb

## 2015-03-04 DIAGNOSIS — J449 Chronic obstructive pulmonary disease, unspecified: Secondary | ICD-10-CM | POA: Diagnosis not present

## 2015-03-04 DIAGNOSIS — J9611 Chronic respiratory failure with hypoxia: Secondary | ICD-10-CM

## 2015-03-04 NOTE — Assessment & Plan Note (Signed)
COPD stable without flare on lowering steroid dose.   Plan  Decrease 5mg  1/2 tab every other day for 2 weeks and then stop.  Continue on Symbicort and Spiriva .  Continue on Oxygen 2l/m  Follow up Dr. Vassie LollAlva  In 3 months and As needed   Please contact office for sooner follow up if symptoms do not improve or worsen or seek emergency care

## 2015-03-04 NOTE — Patient Instructions (Signed)
Decrease 5mg  1/2 tab every other day for 2 weeks and then stop.  Continue on Symbicort and Spiriva .  Continue on Oxygen 2l/m  Follow up Dr. Vassie LollAlva  In 3 months and As needed   Please contact office for sooner follow up if symptoms do not improve or worsen or seek emergency care

## 2015-03-04 NOTE — Assessment & Plan Note (Signed)
Compensated on O2  

## 2015-03-04 NOTE — Progress Notes (Signed)
Subjective:    Patient ID: [name], female    DOB: 26-Nov-1929, 81 y.o.   MRN: [id number]  HPI  85/F , smoker presents for FU of gold D COPD, 2-3 admits/ yr.  She has been on home O2 since dec 2010 ([employer]). She smoked 1/2 PPD, about 42 Pyrs - chantix made her sick & she is afraid of cardiac side effects with patches. She sees Dr Myrtis Ser for CAD & has tolerated lisinopril & metoprolol, echo 7/10 showed nml LV fn & non dilated RV. An episode of syncope in 2009 was attributed to NTG & diuretics.   Significant tests/ events  PFTs 08/19/09 >> severe airway obstruction, FEV1 47%, no BD response, air trapping +, severe decrease in diffusion  CT angio 5/12 asc aortic aneurysm 41mm, hepatic cysts  Hosp 06/2011 for New paroxysmal atrial fibrillation with RVR and COPD exacerbation  - not felt to be a coumadin candidate secondary to hx of GIB/AVMs   Hosp adm 07/2012 for stent, 09/2012 for LLE cellulitis & bleeding, ABI (demonstarted moderate Left lower extremity PAD)  LE dopplers (no DVT)      03/04/2015 Follow up : COPD/Resp Failure on O2  Pt returns for 2 month follow up .  Says her breathing is doing about the same, has good/bad days.  Get winded easily. On O2 2 l/m 24/7 .  No chest pain, orthopnea, edema or fever.  On Spiriva and Symbicort  Flu  And Prevnar utd.  Last ov , prednisone was decreased slowly to prednisone  daily .  She has not had a flare of cough, wheezing or dyspnea with lowering the prednisone.  She denies hemoptysis.   Past Medical History  Diagnosis Date  . CAD (coronary artery disease)     a. s/p CABG x5 (2005) b. s/p BMS-prox SVG-D2 (2014)  . Hypertension   . Hyperlipidemia   . GI bleed 2008    AVMs  . Aneurysm, aortic (HCC)     thoracic aorta, stable at 4.1 cm, chest CT, May, 2012  . Syncope     Nitroglycerin plus a diuretic, April, 2009  . Hyponatremia     Chronic. Felt secondary to SIADH   . COPD (chronic obstructive pulmonary disease) (HCC)    on home oxygen  . Tobacco abuse   . Bradycardia   . Carotid artery disease (HCC)     Hx of endarterectomy. Doppler October, 2011, stable, 0-39% RIC A., 40-59% LICA  . Tuberculosis     Exposures to tuberculosis 1970s, has tested negative by the health Department  . Atrial fibrillation (HCC)     Paroxysmal with RVR 07/2011, not felt to be a coumadin candidate secondary to hx of GIB/AVM  . Venous stasis of lower extremity     Chronic  . CHF with left ventricular diastolic dysfunction, NYHA class 2 (HCC)     a. EF 55-60%, echo, April, 2013 b. EF 55-60%, mild biatrial enlargement and PASP 42 mmH  . AAA (abdominal aortic aneurysm) (HCC)   . Complication of anesthesia   . Hx of CABG     CABG 2005  . Hematoma     Right groin hematoma, small, post cath, April, 2014  . Mitral regurgitation     Mild, hospital, March, 2014  . Leg ulcer (HCC)     Ulcerated lesion on the anterior aspect of her left lower leg, June, 2014   Current Outpatient Prescriptions on File Prior to Visit  Medication Sig Dispense Refill  .  albuterol (PROVENTIL HFA;VENTOLIN HFA) 108 (90 BASE) MCG/ACT inhaler Inhale 2 puffs into the lungs every 4 (four) hours as needed for wheezing or shortness of breath. 1 Inhaler 5  . albuterol (PROVENTIL) (2.5 MG/3ML) 0.083% nebulizer solution Take 2.5 mg by nebulization every 6 (six) hours as needed for wheezing.    Marland Kitchen. ALPRAZolam (XANAX) 0.25 MG tablet Take 1 tablet (0.25 mg total) by mouth at bedtime as needed for anxiety. 10 tablet 0  . aspirin EC 81 MG tablet Take 81 mg by mouth daily.    Marland Kitchen. atorvastatin (LIPITOR) 40 MG tablet Take 0.5 tablets (20 mg total) by mouth daily at 6 PM. 90 tablet 1  . diltiazem (CARDIZEM CD) 120 MG 24 hr capsule Take 1 capsule (120 mg total) by mouth daily. 90 capsule 1  . furosemide (LASIX) 40 MG tablet Take 1 tablet (40 mg total) by mouth daily. 90 tablet 3  . insulin aspart (NOVOLOG) 100 UNIT/ML injection Before each meal 3 times a day, 140-199 - 2 units,  200-250 - 4 units, 251-299 - 6 units,  300-349 - 8 units,  350 or above 10 units. Dispense syringes and needles as needed, Ok to switch to PEN if approved. Substitute to any brand approved. DX DM2, Code E11.65 1 vial 12  . insulin glargine (LANTUS) 100 UNIT/ML injection Inject 0.15 mLs (15 Units total) into the skin daily. 10 mL 11  . magnesium oxide (MAG-OX) 400 MG tablet Take 400 mg by mouth daily.    . metoprolol succinate (TOPROL-XL) 25 MG 24 hr tablet Take 1 tablet (25 mg total) by mouth daily. 90 tablet 3  . nitroGLYCERIN (NITROSTAT) 0.4 MG SL tablet Place 0.4 mg under the tongue every 5 (five) minutes as needed for chest pain.    . OXYGEN Inhale into the lungs. 2L continously    . pantoprazole (PROTONIX) 40 MG tablet Take 1 tablet (40 mg total) by mouth daily. 90 tablet 3  . potassium chloride SA (K-DUR,KLOR-CON) 20 MEQ tablet Take 20 mEq by mouth 2 (two) times daily.    . predniSONE (DELTASONE) 5 MG tablet Take 2 tablets daily through September 2016; then On October 1st, decrease to 1 tablet daily 90 tablet 1  . senna (SENOKOT) 8.6 MG TABS tablet Take 1 tablet by mouth daily.    Marland Kitchen. SPIRIVA HANDIHALER 18 MCG inhalation capsule Place 18 mcg into inhaler and inhale daily.     . SYMBICORT 160-4.5 MCG/ACT inhaler USE 2 INHALATIONS TWICE A DAY 30.6 g 0  . ondansetron (ZOFRAN) 8 MG tablet Take 1 tablet (8 mg total) by mouth every 8 (eight) hours as needed for nausea or vomiting. (Patient not taking: Reported on 03/04/2015) 20 tablet 0  . polyethylene glycol (MIRALAX / GLYCOLAX) packet Take 17 g by mouth daily.    . Tiotropium Bromide Monohydrate (SPIRIVA RESPIMAT) 2.5 MCG/ACT AERS Inhale 2 puffs into the lungs daily. (Patient not taking: Reported on 03/04/2015) 4 g 5  . Vitamin D, Ergocalciferol, (DRISDOL) 50000 UNITS CAPS Take 50,000 Units by mouth every 14 (fourteen) days. Every other Sunday     No current facility-administered medications on file prior to visit.     Review of Systems neg for  any significant sore throat, dysphagia, itching, sneezing, nasal congestion or excess/ purulent secretions, fever, chills, sweats, unintended wt loss, pleuritic or exertional cp, hempoptysis, orthopnea pnd or change in chronic leg swelling. Also denies presyncope, palpitations, heartburn, abdominal pain, nausea, vomiting, diarrhea or change in bowel or urinary habits, dysuria,hematuria,  rash, arthralgias, visual complaints, headache, numbness weakness or ataxia.      Objective:   Physical Exam  Gen. Pleasant, chronically ill,in no distress, in wc on o2  VS reviewed  ENT - no lesions, no post nasal drip Neck: No JVD, no thyromegaly, no carotid bruits Lungs: no use of accessory muscles, no dullness to percussion, decreased without rales or rhonchi  Cardiovascular: Rhythm regular, heart sounds  normal, no murmurs or gallops, no peripheral edema Musculoskeletal: No deformities, no cyanosis or clubbing  Skin: thin skin with scattered ecchymosis / skin tears.        Assessment & Plan:

## 2015-03-06 NOTE — Progress Notes (Signed)
Reviewed & agree with plan  

## 2015-03-07 ENCOUNTER — Other Ambulatory Visit: Payer: Self-pay | Admitting: Pulmonary Disease

## 2015-03-18 ENCOUNTER — Emergency Department (HOSPITAL_COMMUNITY): Payer: Medicare Other

## 2015-03-18 ENCOUNTER — Inpatient Hospital Stay (HOSPITAL_COMMUNITY)
Admission: EM | Admit: 2015-03-18 | Discharge: 2015-03-27 | DRG: 190 | Disposition: A | Discharge status: Home / Self Care | Payer: Medicare Other | Attending: Family Medicine | Admitting: Family Medicine

## 2015-03-18 ENCOUNTER — Encounter (HOSPITAL_COMMUNITY): Payer: Self-pay | Admitting: Emergency Medicine

## 2015-03-18 DIAGNOSIS — I248 Other forms of acute ischemic heart disease: Secondary | ICD-10-CM | POA: Diagnosis present

## 2015-03-18 DIAGNOSIS — I5032 Chronic diastolic (congestive) heart failure: Secondary | ICD-10-CM | POA: Diagnosis present

## 2015-03-18 DIAGNOSIS — Z9981 Dependence on supplemental oxygen: Secondary | ICD-10-CM | POA: Diagnosis not present

## 2015-03-18 DIAGNOSIS — I080 Rheumatic disorders of both mitral and aortic valves: Secondary | ICD-10-CM | POA: Diagnosis present

## 2015-03-18 DIAGNOSIS — I10 Essential (primary) hypertension: Secondary | ICD-10-CM | POA: Diagnosis present

## 2015-03-18 DIAGNOSIS — Z955 Presence of coronary angioplasty implant and graft: Secondary | ICD-10-CM | POA: Diagnosis not present

## 2015-03-18 DIAGNOSIS — I4891 Unspecified atrial fibrillation: Secondary | ICD-10-CM | POA: Diagnosis present

## 2015-03-18 DIAGNOSIS — Z794 Long term (current) use of insulin: Secondary | ICD-10-CM | POA: Diagnosis not present

## 2015-03-18 DIAGNOSIS — J440 Chronic obstructive pulmonary disease with acute lower respiratory infection: Secondary | ICD-10-CM | POA: Diagnosis present

## 2015-03-18 DIAGNOSIS — J9600 Acute respiratory failure, unspecified whether with hypoxia or hypercapnia: Secondary | ICD-10-CM | POA: Diagnosis not present

## 2015-03-18 DIAGNOSIS — I481 Persistent atrial fibrillation: Secondary | ICD-10-CM | POA: Diagnosis not present

## 2015-03-18 DIAGNOSIS — N390 Urinary tract infection, site not specified: Secondary | ICD-10-CM | POA: Diagnosis not present

## 2015-03-18 DIAGNOSIS — Z7982 Long term (current) use of aspirin: Secondary | ICD-10-CM | POA: Diagnosis not present

## 2015-03-18 DIAGNOSIS — I444 Left anterior fascicular block: Secondary | ICD-10-CM | POA: Diagnosis present

## 2015-03-18 DIAGNOSIS — J441 Chronic obstructive pulmonary disease with (acute) exacerbation: Principal | ICD-10-CM | POA: Diagnosis present

## 2015-03-18 DIAGNOSIS — E119 Type 2 diabetes mellitus without complications: Secondary | ICD-10-CM | POA: Diagnosis present

## 2015-03-18 DIAGNOSIS — I714 Abdominal aortic aneurysm, without rupture: Secondary | ICD-10-CM | POA: Diagnosis present

## 2015-03-18 DIAGNOSIS — Y95 Nosocomial condition: Secondary | ICD-10-CM | POA: Diagnosis present

## 2015-03-18 DIAGNOSIS — Z7952 Long term (current) use of systemic steroids: Secondary | ICD-10-CM | POA: Diagnosis not present

## 2015-03-18 DIAGNOSIS — E860 Dehydration: Secondary | ICD-10-CM | POA: Diagnosis present

## 2015-03-18 DIAGNOSIS — B9620 Unspecified Escherichia coli [E. coli] as the cause of diseases classified elsewhere: Secondary | ICD-10-CM | POA: Diagnosis not present

## 2015-03-18 DIAGNOSIS — J9621 Acute and chronic respiratory failure with hypoxia: Secondary | ICD-10-CM | POA: Diagnosis present

## 2015-03-18 DIAGNOSIS — I25119 Atherosclerotic heart disease of native coronary artery with unspecified angina pectoris: Secondary | ICD-10-CM | POA: Insufficient documentation

## 2015-03-18 DIAGNOSIS — J189 Pneumonia, unspecified organism: Secondary | ICD-10-CM | POA: Diagnosis present

## 2015-03-18 DIAGNOSIS — R0602 Shortness of breath: Secondary | ICD-10-CM | POA: Diagnosis present

## 2015-03-18 DIAGNOSIS — E785 Hyperlipidemia, unspecified: Secondary | ICD-10-CM | POA: Diagnosis present

## 2015-03-18 DIAGNOSIS — I2510 Atherosclerotic heart disease of native coronary artery without angina pectoris: Secondary | ICD-10-CM | POA: Diagnosis present

## 2015-03-18 DIAGNOSIS — E875 Hyperkalemia: Secondary | ICD-10-CM | POA: Diagnosis present

## 2015-03-18 DIAGNOSIS — Z951 Presence of aortocoronary bypass graft: Secondary | ICD-10-CM

## 2015-03-18 DIAGNOSIS — I340 Nonrheumatic mitral (valve) insufficiency: Secondary | ICD-10-CM | POA: Diagnosis present

## 2015-03-18 DIAGNOSIS — J9601 Acute respiratory failure with hypoxia: Secondary | ICD-10-CM | POA: Diagnosis not present

## 2015-03-18 DIAGNOSIS — I482 Chronic atrial fibrillation: Secondary | ICD-10-CM | POA: Diagnosis present

## 2015-03-18 DIAGNOSIS — R0600 Dyspnea, unspecified: Secondary | ICD-10-CM

## 2015-03-18 DIAGNOSIS — N179 Acute kidney failure, unspecified: Secondary | ICD-10-CM | POA: Diagnosis present

## 2015-03-18 HISTORY — DX: Chronic atrial fibrillation, unspecified: I48.20

## 2015-03-18 HISTORY — DX: Type 2 diabetes mellitus without complications: E11.9

## 2015-03-18 LAB — URINE MICROSCOPIC-ADD ON

## 2015-03-18 LAB — I-STAT TROPONIN, ED: TROPONIN I, POC: 0.01 ng/mL (ref 0.00–0.08)

## 2015-03-18 LAB — URINALYSIS, ROUTINE W REFLEX MICROSCOPIC
BILIRUBIN URINE: NEGATIVE
Glucose, UA: NEGATIVE mg/dL
HGB URINE DIPSTICK: NEGATIVE
KETONES UR: NEGATIVE mg/dL
Nitrite: POSITIVE — AB
PH: 5 (ref 5.0–8.0)
Protein, ur: NEGATIVE mg/dL
SPECIFIC GRAVITY, URINE: 1.016 (ref 1.005–1.030)

## 2015-03-18 LAB — I-STAT ARTERIAL BLOOD GAS, ED
ACID-BASE EXCESS: 2 mmol/L (ref 0.0–2.0)
BICARBONATE: 26.9 meq/L — AB (ref 20.0–24.0)
O2 Saturation: 97 %
PO2 ART: 91 mmHg (ref 80.0–100.0)
TCO2: 28 mmol/L (ref 0–100)
pCO2 arterial: 43.9 mmHg (ref 35.0–45.0)
pH, Arterial: 7.399 (ref 7.350–7.450)

## 2015-03-18 LAB — BASIC METABOLIC PANEL
Anion gap: 10 (ref 5–15)
BUN: 32 mg/dL — ABNORMAL HIGH (ref 6–20)
CHLORIDE: 98 mmol/L — AB (ref 101–111)
CO2: 30 mmol/L (ref 22–32)
CREATININE: 1.43 mg/dL — AB (ref 0.44–1.00)
Calcium: 10 mg/dL (ref 8.9–10.3)
GFR calc Af Amer: 38 mL/min — ABNORMAL LOW (ref >60)
GFR calc non Af Amer: 33 mL/min — ABNORMAL LOW (ref >60)
GLUCOSE: 110 mg/dL — AB (ref 65–99)
Potassium: 4.8 mmol/L (ref 3.5–5.1)
Sodium: 138 mmol/L (ref 135–145)

## 2015-03-18 LAB — CBC
HCT: 35.7 % — ABNORMAL LOW (ref 36.0–46.0)
HEMOGLOBIN: 10.9 g/dL — AB (ref 12.0–15.0)
MCH: 28.3 pg (ref 26.0–34.0)
MCHC: 30.5 g/dL (ref 30.0–36.0)
MCV: 92.7 fL (ref 78.0–100.0)
PLATELETS: 199 10*3/uL (ref 150–400)
RBC: 3.85 MIL/uL — AB (ref 3.87–5.11)
RDW: 14.5 % (ref 11.5–15.5)
WBC: 12.3 10*3/uL — ABNORMAL HIGH (ref 4.0–10.5)

## 2015-03-18 LAB — BRAIN NATRIURETIC PEPTIDE: B NATRIURETIC PEPTIDE 5: 153.9 pg/mL — AB (ref 0.0–100.0)

## 2015-03-18 LAB — I-STAT CG4 LACTIC ACID, ED: LACTIC ACID, VENOUS: 1.55 mmol/L (ref 0.5–2.0)

## 2015-03-18 LAB — TROPONIN I: TROPONIN I: 0.04 ng/mL — AB (ref <0.031)

## 2015-03-18 LAB — GLUCOSE, CAPILLARY: Glucose-Capillary: 145 mg/dL — ABNORMAL HIGH (ref 65–99)

## 2015-03-18 MED ORDER — ACETAMINOPHEN 650 MG RE SUPP: 650.0000 mg | Freq: Four times a day (QID) | RECTAL | Status: DC | PRN

## 2015-03-18 MED ORDER — LEVOFLOXACIN IN D5W 750 MG/150ML IV SOLN
750.0000 mg | INTRAVENOUS | Status: DC
  Administered 2015-03-18: 750 mg via INTRAVENOUS
  Filled 2015-03-18 (×2): qty 150

## 2015-03-18 MED ORDER — PREDNISONE 50 MG PO TABS: 50.0000 mg | ORAL_TABLET | Freq: Every day | ORAL | Status: DC

## 2015-03-18 MED ORDER — DILTIAZEM HCL ER COATED BEADS 120 MG PO CP24
120.0000 mg | ORAL_CAPSULE | Freq: Every day | ORAL | Status: DC
  Administered 2015-03-19: 120 mg via ORAL
  Filled 2015-03-18: qty 1

## 2015-03-18 MED ORDER — SODIUM CHLORIDE 0.9 % IJ SOLN
3.0000 mL | Freq: Two times a day (BID) | INTRAMUSCULAR | Status: DC
  Administered 2015-03-19 – 2015-03-27 (×17): 3 mL via INTRAVENOUS

## 2015-03-18 MED ORDER — FUROSEMIDE 40 MG PO TABS
40.0000 mg | ORAL_TABLET | Freq: Every day | ORAL | Status: DC
  Administered 2015-03-19 – 2015-03-20 (×2): 40 mg via ORAL
  Filled 2015-03-18 (×2): qty 1

## 2015-03-18 MED ORDER — SODIUM CHLORIDE 0.9 % IV BOLUS (SEPSIS): 500.0000 mL | INTRAVENOUS | Status: DC

## 2015-03-18 MED ORDER — VANCOMYCIN HCL 10 G IV SOLR
1500.0000 mg | Freq: Once | INTRAVENOUS | Status: AC
  Administered 2015-03-18: 1500 mg via INTRAVENOUS
  Filled 2015-03-18: qty 1500

## 2015-03-18 MED ORDER — CEFTAZIDIME 2 G IJ SOLR
2.0000 g | Freq: Two times a day (BID) | INTRAMUSCULAR | Status: DC
  Filled 2015-03-18: qty 2

## 2015-03-18 MED ORDER — SENNA 8.6 MG PO TABS
1.0000 | ORAL_TABLET | Freq: Two times a day (BID) | ORAL | Status: DC
  Administered 2015-03-18 – 2015-03-27 (×15): 8.6 mg via ORAL
  Filled 2015-03-18 (×18): qty 1

## 2015-03-18 MED ORDER — DEXTROSE 5 % IV SOLN
2.0000 g | Freq: Three times a day (TID) | INTRAVENOUS | Status: DC
  Administered 2015-03-18: 2 g via INTRAVENOUS
  Filled 2015-03-18: qty 2

## 2015-03-18 MED ORDER — SODIUM CHLORIDE 0.9 % IV SOLN
INTRAVENOUS | Status: DC
  Administered 2015-03-18: 21:00:00 via INTRAVENOUS

## 2015-03-18 MED ORDER — ALBUTEROL SULFATE (2.5 MG/3ML) 0.083% IN NEBU
2.5000 mg | INHALATION_SOLUTION | Freq: Four times a day (QID) | RESPIRATORY_TRACT | Status: DC | PRN
  Administered 2015-03-20 – 2015-03-24 (×4): 2.5 mg via RESPIRATORY_TRACT
  Filled 2015-03-18 (×6): qty 3

## 2015-03-18 MED ORDER — PANTOPRAZOLE SODIUM 40 MG PO TBEC
40.0000 mg | DELAYED_RELEASE_TABLET | Freq: Every day | ORAL | Status: DC
  Administered 2015-03-19 – 2015-03-27 (×9): 40 mg via ORAL
  Filled 2015-03-18 (×9): qty 1

## 2015-03-18 MED ORDER — ATORVASTATIN CALCIUM 20 MG PO TABS
20.0000 mg | ORAL_TABLET | Freq: Every day | ORAL | Status: DC
  Administered 2015-03-18 – 2015-03-26 (×9): 20 mg via ORAL
  Filled 2015-03-18 (×10): qty 1

## 2015-03-18 MED ORDER — POLYETHYLENE GLYCOL 3350 17 G PO PACK: 17.0000 g | PACK | Freq: Every day | ORAL | Status: DC | PRN

## 2015-03-18 MED ORDER — VANCOMYCIN HCL IN DEXTROSE 1-5 GM/200ML-% IV SOLN
1000.0000 mg | Freq: Once | INTRAVENOUS | Status: DC
  Filled 2015-03-18: qty 200

## 2015-03-18 MED ORDER — ASPIRIN EC 81 MG PO TBEC
81.0000 mg | DELAYED_RELEASE_TABLET | Freq: Every day | ORAL | Status: DC
  Administered 2015-03-19 – 2015-03-27 (×9): 81 mg via ORAL
  Filled 2015-03-18 (×10): qty 1

## 2015-03-18 MED ORDER — VANCOMYCIN HCL IN DEXTROSE 1-5 GM/200ML-% IV SOLN: 1000.0000 mg | INTRAVENOUS | Status: DC

## 2015-03-18 MED ORDER — BUDESONIDE-FORMOTEROL FUMARATE 160-4.5 MCG/ACT IN AERO
2.0000 | INHALATION_SPRAY | Freq: Two times a day (BID) | RESPIRATORY_TRACT | Status: DC
  Administered 2015-03-19 – 2015-03-26 (×14): 2 via RESPIRATORY_TRACT
  Filled 2015-03-18 (×3): qty 6

## 2015-03-18 MED ORDER — SODIUM CHLORIDE 0.9 % IV BOLUS (SEPSIS)
1000.0000 mL | INTRAVENOUS | Status: AC
  Administered 2015-03-18: 1000 mL via INTRAVENOUS

## 2015-03-18 MED ORDER — NITROGLYCERIN 0.4 MG SL SUBL: 0.4000 mg | SUBLINGUAL_TABLET | SUBLINGUAL | Status: DC | PRN

## 2015-03-18 MED ORDER — ALBUTEROL SULFATE (2.5 MG/3ML) 0.083% IN NEBU: 5.0000 mg | INHALATION_SOLUTION | Freq: Once | RESPIRATORY_TRACT | Status: DC

## 2015-03-18 MED ORDER — TIOTROPIUM BROMIDE MONOHYDRATE 18 MCG IN CAPS
1.0000 | ORAL_CAPSULE | Freq: Every day | RESPIRATORY_TRACT | Status: DC
  Administered 2015-03-19 – 2015-03-26 (×8): 18 ug via RESPIRATORY_TRACT
  Filled 2015-03-18 (×4): qty 5

## 2015-03-18 MED ORDER — POTASSIUM CHLORIDE CRYS ER 20 MEQ PO TBCR
20.0000 meq | EXTENDED_RELEASE_TABLET | Freq: Two times a day (BID) | ORAL | Status: DC
  Administered 2015-03-18: 20 meq via ORAL
  Filled 2015-03-18: qty 1

## 2015-03-18 MED ORDER — ACETAMINOPHEN 325 MG PO TABS
650.0000 mg | ORAL_TABLET | Freq: Four times a day (QID) | ORAL | Status: DC | PRN
  Administered 2015-03-18 – 2015-03-20 (×2): 650 mg via ORAL
  Filled 2015-03-18 (×2): qty 2

## 2015-03-18 MED ORDER — DEXTROSE 5 % IV SOLN: 2.0000 g | Freq: Once | INTRAVENOUS | Status: DC

## 2015-03-18 MED ORDER — ENOXAPARIN SODIUM 40 MG/0.4ML ~~LOC~~ SOLN
40.0000 mg | SUBCUTANEOUS | Status: DC
  Administered 2015-03-18 – 2015-03-26 (×9): 40 mg via SUBCUTANEOUS
  Filled 2015-03-18 (×10): qty 0.4

## 2015-03-18 MED ORDER — INSULIN GLARGINE 100 UNIT/ML ~~LOC~~ SOLN
10.0000 [IU] | Freq: Every day | SUBCUTANEOUS | Status: DC
  Administered 2015-03-18 – 2015-03-26 (×9): 10 [IU] via SUBCUTANEOUS
  Filled 2015-03-18 (×10): qty 0.1

## 2015-03-18 MED ORDER — METOPROLOL SUCCINATE ER 25 MG PO TB24
25.0000 mg | ORAL_TABLET | Freq: Every day | ORAL | Status: DC
  Administered 2015-03-19: 25 mg via ORAL
  Filled 2015-03-18: qty 1

## 2015-03-18 MED ORDER — INSULIN ASPART 100 UNIT/ML ~~LOC~~ SOLN
0.0000 [IU] | Freq: Three times a day (TID) | SUBCUTANEOUS | Status: DC
  Administered 2015-03-19: 2 [IU] via SUBCUTANEOUS
  Administered 2015-03-19: 1 [IU] via SUBCUTANEOUS
  Administered 2015-03-20: 5 [IU] via SUBCUTANEOUS
  Administered 2015-03-20: 3 [IU] via SUBCUTANEOUS
  Administered 2015-03-20: 1 [IU] via SUBCUTANEOUS
  Administered 2015-03-21: 5 [IU] via SUBCUTANEOUS
  Administered 2015-03-21 – 2015-03-22 (×3): 1 [IU] via SUBCUTANEOUS
  Administered 2015-03-22: 2 [IU] via SUBCUTANEOUS
  Administered 2015-03-22: 3 [IU] via SUBCUTANEOUS
  Administered 2015-03-23 (×2): 1 [IU] via SUBCUTANEOUS
  Administered 2015-03-23: 3 [IU] via SUBCUTANEOUS
  Administered 2015-03-24: 2 [IU] via SUBCUTANEOUS
  Administered 2015-03-24 (×2): 1 [IU] via SUBCUTANEOUS
  Administered 2015-03-25: 3 [IU] via SUBCUTANEOUS
  Administered 2015-03-25: 2 [IU] via SUBCUTANEOUS
  Administered 2015-03-25: 3 [IU] via SUBCUTANEOUS
  Administered 2015-03-26 (×2): 1 [IU] via SUBCUTANEOUS
  Administered 2015-03-26 – 2015-03-27 (×2): 2 [IU] via SUBCUTANEOUS

## 2015-03-18 NOTE — ED Notes (Signed)
Called pharmacy, 1500mg  of vanc currently ordered. 1g is available. Jeanice LimHolly acknowledges, advises to hold on starting vanc to verify if full dose has been sent already. If not, she will change order to 1g of vanc.

## 2015-03-18 NOTE — ED Notes (Signed)
From home via [facility] for resp dis, arrives on CPAP, has had 10mg  Alb and 1 mg Atrovent, 125mg  Solo-medrol pta, 20g LAC, no pain, resp improved on arrival

## 2015-03-18 NOTE — ED Provider Notes (Addendum)
CSN: [id number]     Arrival date & time 03/18/15  1550 History   First MD Initiated Contact with Patient 03/18/15 1609     Chief Complaint  Patient presents with  . Shortness of Breath    Complains of shortness of breath onset 3 days ago with mild cough. Patient had temperature 100.7 three days ago. Brought by EMS treated by EMS with albuterol 10 mg and Atrovent 1 mg via nebulizer, CPAP, Solu-Medrol 125 g IV. She feels improved at present. Other associated symptoms include generalized weakness. She was too weak to stand this morning. She normally has bedside commode and is somewhat ambulatory but also has wheelchair at home. Dyspnea not made better or worse by anything. No chest pain.  (Consider location/radiation/quality/duration/timing/severity/associated sxs/prior Treatment) Patient is a 81 y.o. female presenting with shortness of breath.  Shortness of Breath Associated symptoms: cough and fever     Past Medical History  Diagnosis Date  . CAD (coronary artery disease)     a. s/p CABG x5 (2005) b. s/p BMS-prox SVG-D2 (2014)  . Hypertension   . Hyperlipidemia   . GI bleed 2008    AVMs  . Aneurysm, aortic (HCC)     thoracic aorta, stable at 4.1 cm, chest CT, May, 2012  . Syncope     Nitroglycerin plus a diuretic, April, 2009  . Hyponatremia     Chronic. Felt secondary to SIADH   . COPD (chronic obstructive pulmonary disease) (HCC)     on home oxygen  . Tobacco abuse   . Bradycardia   . Carotid artery disease (HCC)     Hx of endarterectomy. Doppler October, 2011, stable, 0-39% RIC A., 40-59% LICA  . Tuberculosis     Exposures to tuberculosis 1970s, has tested negative by the health Department  . Atrial fibrillation (HCC)     Paroxysmal with RVR 07/2011, not felt to be a coumadin candidate secondary to hx of GIB/AVM  . Venous stasis of lower extremity     Chronic  . CHF with left ventricular diastolic dysfunction, NYHA class 2 (HCC)     a. EF 55-60%, echo, April, 2013 b. EF  55-60%, mild biatrial enlargement and PASP 42 mmH  . AAA (abdominal aortic aneurysm) (HCC)   . Complication of anesthesia   . Hx of CABG     CABG 2005  . Hematoma     Right groin hematoma, small, post cath, April, 2014  . Mitral regurgitation     Mild, hospital, March, 2014  . Leg ulcer (HCC)     Ulcerated lesion on the anterior aspect of her left lower leg, June, 2014   Past Surgical History  Procedure Laterality Date  . Cholecystectomy    . Carotid endartercetomy    . Abdominal hysterectomy    . Coronary artery bypass graft  2005  . Coronary angioplasty with stent placement  07/18/12    severe native coronary artery disease, 99% proximal stenosis of the SVG-D2 status post successful PTCA/PCI with a Veriflex bare-metal stent, widely patent SVGs to both the right PDA sequential OM1 to OM 2, EF 65-70% and 3+ mitral regurgitation  . Left heart catheterization with coronary angiogram N/A 07/18/2012    Procedure: LEFT HEART CATHETERIZATION WITH CORONARY ANGIOGRAM;  Surgeon: Marykay Lex, MD;  Location: [facility] CATH LAB;  Service: Cardiovascular;  Laterality: N/A;  . Percutaneous coronary stent intervention (pci-s)  07/18/2012    Procedure: PERCUTANEOUS CORONARY STENT INTERVENTION (PCI-S);  Surgeon: Marykay Lex, MD;  Location:  [facility] CATH LAB;  Service: Cardiovascular;;  . Ercp N/A 11/20/2014    Procedure: ENDOSCOPIC RETROGRADE CHOLANGIOPANCREATOGRAPHY (ERCP);  Surgeon: Meryl Dare, MD;  Location: [facility] ENDOSCOPY;  Service: Endoscopy;  Laterality: N/A;   Family History  Problem Relation Age of Onset  . Stroke Sister   . Heart failure Mother   . Cancer Sister     Breast and Lung   . Cancer Brother     breast cancer  . Heart attack Neg Hx   . Hypertension Neg Hx    Social History  Substance Use Topics  . Smoking status: Former Smoker -- 0.50 packs/day for 59 years    Types: Cigarettes    Quit date: 02/01/2013  . Smokeless tobacco: None  . Alcohol Use: No   OB History    No data  available     Review of Systems  Constitutional: Positive for fever.  HENT: Negative.   Respiratory: Positive for cough and shortness of breath.        Chronic dyspnea wears oxygen 2 L at home  Cardiovascular: Negative.   Gastrointestinal: Negative.   Musculoskeletal: Positive for gait problem.  Skin: Negative.   Neurological: Positive for weakness.  Psychiatric/Behavioral: Negative.   All other systems reviewed and are negative.     Allergies  Other; Codeine; Sulfonamide derivatives; Warfarin; Penicillins; and Ranitidine hcl  Home Medications   Prior to Admission medications   Medication Sig Start Date End Date Taking? Authorizing Provider  albuterol (PROVENTIL HFA;VENTOLIN HFA) 108 (90 BASE) MCG/ACT inhaler Inhale 2 puffs into the lungs every 4 (four) hours as needed for wheezing or shortness of breath. 07/16/14   Tammy S Parrett, NP  albuterol (PROVENTIL) (2.5 MG/3ML) 0.083% nebulizer solution Take 2.5 mg by nebulization every 6 (six) hours as needed for wheezing.    Historical Provider, MD  ALPRAZolam Prudy Feeler) 0.25 MG tablet Take 1 tablet (0.25 mg total) by mouth at bedtime as needed for anxiety. 11/23/14   Leroy Sea, MD  aspirin EC 81 MG tablet Take 81 mg by mouth daily.    Historical Provider, MD  atorvastatin (LIPITOR) 40 MG tablet Take 0.5 tablets (20 mg total) by mouth daily at 6 PM. 11/07/13   Rosalio Macadamia, NP  diltiazem (CARDIZEM CD) 120 MG 24 hr capsule Take 1 capsule (120 mg total) by mouth daily. 02/07/15   Luis Abed, MD  furosemide (LASIX) 40 MG tablet Take 1 tablet (40 mg total) by mouth daily. 12/28/14   Leone Brand, NP  insulin aspart (NOVOLOG) 100 UNIT/ML injection Before each meal 3 times a day, 140-199 - 2 units, 200-250 - 4 units, 251-299 - 6 units,  300-349 - 8 units,  350 or above 10 units. Dispense syringes and needles as needed, Ok to switch to PEN if approved. Substitute to any brand approved. DX DM2, Code E11.65 11/23/14   Leroy Sea, MD   insulin glargine (LANTUS) 100 UNIT/ML injection Inject 0.15 mLs (15 Units total) into the skin daily. 11/23/14   Leroy Sea, MD  magnesium oxide (MAG-OX) 400 MG tablet Take 400 mg by mouth daily.    Historical Provider, MD  metoprolol succinate (TOPROL-XL) 25 MG 24 hr tablet Take 1 tablet (25 mg total) by mouth daily. 12/26/14   Leone Brand, NP  nitroGLYCERIN (NITROSTAT) 0.4 MG SL tablet Place 0.4 mg under the tongue every 5 (five) minutes as needed for chest pain.    Historical Provider, MD  ondansetron (ZOFRAN) 8 MG  tablet Take 1 tablet (8 mg total) by mouth every 8 (eight) hours as needed for nausea or vomiting. Patient not taking: Reported on 03/04/2015 10/28/14   Mancel Bale, MD  OXYGEN Inhale into the lungs. 2L continously    Historical Provider, MD  pantoprazole (PROTONIX) 40 MG tablet Take 1 tablet (40 mg total) by mouth daily. 12/26/14   Leone Brand, NP  polyethylene glycol (MIRALAX / GLYCOLAX) packet Take 17 g by mouth daily.    Historical Provider, MD  potassium chloride SA (K-DUR,KLOR-CON) 20 MEQ tablet Take 20 mEq by mouth 2 (two) times daily.    Historical Provider, MD  predniSONE (DELTASONE) 5 MG tablet Take 2 tablets daily through September 2016; then On October 1st, decrease to 1 tablet daily 01/01/15   Oretha Milch, MD  senna (SENOKOT) 8.6 MG TABS tablet Take 1 tablet by mouth daily.    Historical Provider, MD  SPIRIVA HANDIHALER 18 MCG inhalation capsule Place 18 mcg into inhaler and inhale daily.  12/09/14   Historical Provider, MD  SPIRIVA HANDIHALER 18 MCG inhalation capsule INHALE THE CONTENTS OF 1 CAPSULE DAILY 03/07/15   Oretha Milch, MD  SYMBICORT 160-4.5 MCG/ACT inhaler USE 2 INHALATIONS TWICE A DAY 11/19/14   Oretha Milch, MD  Tiotropium Bromide Monohydrate (SPIRIVA RESPIMAT) 2.5 MCG/ACT AERS Inhale 2 puffs into the lungs daily. Patient not taking: Reported on 03/04/2015 07/16/14   Tammy S Parrett, NP  Vitamin D, Ergocalciferol, (DRISDOL) 50000 UNITS CAPS Take  50,000 Units by mouth every 14 (fourteen) days. Every other Sunday    Historical Provider, MD   BP 119/88 mmHg  Pulse 125  Temp(Src) 99.7 F (37.6 C) (Rectal)  Resp 20  SpO2 100% Physical Exam  Constitutional: No distress.  Chronically ill-appearing  HENT:  Head: Normocephalic and atraumatic.  Eyes: Conjunctivae are normal. Pupils are equal, round, and reactive to light.  Neck: Neck supple. No tracheal deviation present. No thyromegaly present.  Cardiovascular:  No murmur heard. Irregularly irregular, tachycardic  Pulmonary/Chest: Effort normal.  Diffuse rhonchi  Abdominal: Soft. Bowel sounds are normal. She exhibits no distension. There is no tenderness.  Obese  Musculoskeletal: Normal range of motion. She exhibits no edema or tenderness.  Neurological: She is alert. Coordination normal.  Skin: Skin is warm and dry. No rash noted.  Psychiatric: She has a normal mood and affect.  Nursing note and vitals reviewed.   ED Course  Procedures (including critical care time) Labs Review Labs Reviewed  BASIC METABOLIC PANEL - Abnormal; Notable for the following:    Chloride 98 (*)    Glucose, Bld 110 (*)    BUN 32 (*)    Creatinine, Ser 1.43 (*)    GFR calc non Af Amer 33 (*)    GFR calc Af Amer 38 (*)    All other components within normal limits  CBC - Abnormal; Notable for the following:    WBC 12.3 (*)    RBC 3.85 (*)    Hemoglobin 10.9 (*)    HCT 35.7 (*)    All other components within normal limits  BRAIN NATRIURETIC PEPTIDE  URINALYSIS, ROUTINE W REFLEX MICROSCOPIC (NOT AT [facility])  I-STAT TROPOININ, ED    Imaging Review No results found. I have personally reviewed and evaluated these images and lab results as part of my medical decision-making.   EKG Interpretation   Date/Time:  Monday March 18 2015 16:04:56 EST Ventricular Rate:  118 PR Interval:    QRS Duration: 94 QT  Interval:  316 QTC Calculation: 443 R Axis:   -56 Text Interpretation:  Atrial  fibrillation Left anterior fascicular block  Nonspecific T abnormalities, lateral leads No significant change since  last tracing Confirmed by Ethelda ChickJACUBOWITZ  MD, Jorey Dollard [phone]([id number]) on 03/18/2015  4:13:00 PM     Atrial fibrillation new over 2004  720 patient resting comfortably.  Chest xray viewed by me Results for orders placed or performed during the hospital encounter of 03/18/15  Basic metabolic panel  Result Value Ref Range   Sodium 138 135 - 145 mmol/L   Potassium 4.8 3.5 - 5.1 mmol/L   Chloride 98 (L) 101 - 111 mmol/L   CO2 30 22 - 32 mmol/L   Glucose, Bld 110 (H) 65 - 99 mg/dL   BUN 32 (H) 6 - 20 mg/dL   Creatinine, Ser 6.041.43 (H) 0.44 - 1.00 mg/dL   Calcium 54.010.0 8.9 - 98.110.3 mg/dL   GFR calc non Af Amer 33 (L) >60 mL/min   GFR calc Af Amer 38 (L) >60 mL/min   Anion gap 10 5 - 15  CBC  Result Value Ref Range   WBC 12.3 (H) 4.0 - 10.5 K/uL   RBC 3.85 (L) 3.87 - 5.11 MIL/uL   Hemoglobin 10.9 (L) 12.0 - 15.0 g/dL   HCT 19.135.7 (L) 47.836.0 - 29.546.0 %   MCV 92.7 78.0 - 100.0 fL   MCH 28.3 26.0 - 34.0 pg   MCHC 30.5 30.0 - 36.0 g/dL   RDW 62.114.5 30.811.5 - 65.715.5 %   Platelets 199 150 - 400 K/uL  Brain natriuretic peptide  Result Value Ref Range   B Natriuretic Peptide 153.9 (H) 0.0 - 100.0 pg/mL  I-Stat Troponin, ED (not at [facility])  Result Value Ref Range   Troponin i, poc 0.01 0.00 - 0.08 ng/mL   Comment 3           Dg Chest Portable 1 View  03/18/2015  CLINICAL DATA:  81 year old female in respiratory distress. Clinical history of COPD. EXAM: PORTABLE CHEST 1 VIEW COMPARISON:  Prior chest x-ray 11/19/2014 FINDINGS: Stable cardiomegaly. Patient is status post median sternotomy with evidence of prior multivessel CABG. Similar appearance of the lungs with diffuse bilateral bronchitic change and interstitial prominence. Perhaps slightly increased patchy airspace opacity in both lung bases. No new focal airspace consolidation, pulmonary edema, pleural effusion or pneumothorax. Stable mild pulmonary  vascular congestion. IMPRESSION: 1. Perhaps slightly increased patchy nonspecific opacities in both lung bases which may represent atelectasis or infiltrate. 2. Otherwise, stable appearance of chronic bronchitic change, pulmonary vascular congestion and interstitial prominence compared to prior imaging. Electronically Signed   By: Malachy MoanHeath  McCullough M.D.   On: 03/18/2015 16:51    MDM  Patient was hospitalized 1.5 months ago. In light of cough, rhonchi, shortness of breath, history of fever, leukocytosis will treat for age, code sepsis called. Afib with rver to be trewated with iv fluids, as pt likely dehydrated Final diagnoses:  None   Spoke with Dr.Mckeag admit to telemetry, intravenous antibiotics, intravenous fluids oxygen Diagnosis #1HCAP #2 sepsis #3 anemia #4 atrial fibrillation with rapid ventricular response  CRITICAL CARE Performed by: Doug SouJACUBOWITZ,Selwyn Reason Total critical care time: 30 minute minutes Critical care time was exclusive of separately billable procedures and treating other patients. Critical care was necessary to treat or prevent imminent or life-threatening deterioration. Critical care was time spent personally by me on the following activities: development of treatment plan with patient and/or surrogate as well as nursing, discussions with consultants, evaluation  of patient's response to treatment, examination of patient, obtaining history from patient or surrogate, ordering and performing treatments and interventions, ordering and review of laboratory studies, ordering and review of radiographic studies, pulse oximetry and re-evaluation of patient's condition.    Doug Sou, MD 03/18/15 Wynetta Emery  Doug Sou, MD 03/18/15 [phone]

## 2015-03-18 NOTE — ED Notes (Signed)
Admitting MD at bedside to assess pt

## 2015-03-18 NOTE — H&P (Signed)
Family Medicine Teaching [facility] Admission History and Physical Service Pager: [phone]  Patient name: [name] Medical record number: [id number] Date of birth: 1929-10-03 Age: 81 y.o. Gender: female  Primary Care Provider: Kaleen Mask, MD Consultants: None Code Status: Full  Chief Complaint: Dyspnea and cough  Assessment and Plan: [name] is a 81 y.o. female presenting with dyspnea and cough. PMH is significant for COPD (on home oxygen, 2 L), A. fib with RVR, chronic diastolic CHF, s/p CABG, CAD, PAD, aortic valve sclerosis, mitral regurgitation, hypertension, hyperlipidemia, DM2, aortic aneurysm, and tobacco abuse. Patient's current presentation most consistent with an acute COPD exacerbation. However, cannot rule out at this time HCAP (due to recent hospitalization), CHF exacerbation (CXR results, mildly elevated BNP, and neg Troponin makes this less likely), or PE (AFib places her at risk but lab evidence of heart strain is not present. Also Well's Score 1.5). Will treat for COPD exacerbation at this time.  Dyspnea, likely secondary to COPD exacerbation: Gold D COPD; On 2 L of oxygen at home. Requiring a significantly higher amount at this time. ED initially treated for HCAP, providing 1 dose of vancomycin and Fortaz. Currently afebrile. No specific region of consolidation on chest x-ray. Followed by Dr. Vassie Loll at [facility] pulmonology. - Admit to family medicine teaching service; Dr. Gwendolyn Grant attending physician - Levaquin per pharmacy - Prednisone 50 mg daily - Continue home Spiriva, Symbicort, albuterol nebulizer PRN. - Supplemental O2 to keep saturations above 92% (will contact nursing to ensure saturations are titrated down as to not persist at 100%.) - ABG pending (to ensure some confusion by patient in ED was her baseline and not symptomatic hypoxia/hypercardia >> most recent Cards office note labels patient as a poor historian) - BNP: 153.9; i-STAT  troponin: 0.01 - Contact [place] pulmonology in AM to inform them of her admission  A. fib with RVR: Patient currently in atrial fibrillation and tachycardic to the 110s on admission. Blood pressure stable in 120s/80s. Patient is not currently on anticoagulation due to previous GI bleed Patient is followed by Dr. Rejeana Brock at [facility]. - Admitted to telemetry - I-STAT troponin negative; BNP mildly elevated to 153.9 - Continue home medication regimen: Diltiazem 120 mg daily, metoprolol 25 mg daily, Lasix 40 mg daily, aspirin 81 mg daily - Home dose KDur 20 mEq twice a day - AM EKG - CHADS2 = 4  >> Would typically consider anticoagulation, but has h/o GI bleed - Contact cardiology in the a.m.  Chronic diastolic CHF/CABG (2005)/CAD/PAD/aortic valve sclerosis/mitral regurgitation/hypertension: CHF last EF of 55%- 60% - As above. - Home nitroglycerin tablet when necessary for chest pain  Type 2 diabetes: Patient on injectable insulin at home. CBG 110 on admission. - Lantus 10 units daily (home dose 15 units daily) - Sliding-scale insulin sensitive  Hyperlipidemia - Continue Lipitor 20 mg daily   FEN/GI: IV fluids KVO, heart healthy/carb modified diet Prophylaxis: Lovenox  Disposition: Pending medical improvement  History of Present Illness:  [name] is a 81 y.o. female presenting with shortness of breath and cough. Patient states that she is been feeling short of breath over the past 3 days. She states that over the past 3 days she has also had a mild to moderate cough with occasional sputum production. She reports that a cough is not uncommon for her however the sputum production is. She also reports that she had been feeling weak earlier this morning when trying to get around the house. She cannot  remember what prompted her to call EMS, and admits that she can't quite remember if she has had any fever. She denies any chest pain at this time. She denies any nausea  vomiting or diarrhea. She denies any knowledge of any dysuria, lightheadedness, headache, or confusion. She endorses high compliance with her medications.  Patient was brought in by EMS. She had been provided albuterol, Atrovent, Solu-Medrol, And supplemental oxygen via CPap. In the ED she was given a Venturi mask with good response to 4 L. However, she dropped down to the low 80's when attempting to reduce oxygen to her home level of 2 L. The decision was made to admit the patient.  Of note: Patient has a recent hospitalization (~1 month ago) for a similar issue.  Review Of Systems: Per HPI. Otherwise the remainder of the systems were negative.  Patient Active Problem List   Diagnosis Date Noted  . HCAP (healthcare-associated pneumonia) 03/18/2015  . Bile duct obstruction   . Elevated LFTs   . Elevated troponin 11/19/2014  . SOB (shortness of breath)   . Transaminitis 11/18/2014  . Pancreatic lesion 11/18/2014  . Sepsis (HCC) 05/04/2014  . UTI (lower urinary tract infection) 05/04/2014  . Atrial fibrillation with RVR (HCC) 05/04/2014  . Atrial fibrillation with rapid ventricular response (HCC)   . Pyelonephritis   . Bronchitis, chronic obstructive w acute bronchitis (HCC) 02/02/2014  . COPD exacerbation (HCC) 02/01/2014  . Dyspnea 11/15/2013  . Atrial fibrillation with tachycardic ventricular rate (HCC) 11/15/2013  . Peripheral arterial disease (HCC) 10/17/2013  . Atypical chest pain 02/08/2013  . Cellulitis, leg 09/28/2012  . Leg ulcer (HCC)   . CAD (coronary artery disease)   . Hematoma   . Mitral regurgitation   . Chronic diastolic CHF (congestive heart failure) (HCC) 07/20/2012  . Anemia 07/20/2012  . Intermediate coronary syndrome (HCC) 07/18/2012  . Acute exacerbation of chronic obstructive pulmonary disease (COPD) (HCC) 02/15/2012  . Dehydration 02/12/2012  . Alkalosis, metabolic 02/12/2012  . Generalized weakness 02/11/2012  . Atrial fibrillation (HCC)   . Chronic  respiratory failure (HCC) 07/29/2011  . H/O steroid therapy 07/28/2011  . Edema   . Aortic valve sclerosis   . Hypertension   . Hyperlipidemia   . GI bleed   . Aneurysm, aortic (HCC)   . Syncope   . Hyponatremia   . COPD (chronic obstructive pulmonary disease) (HCC)   . Tobacco abuse   . Bradycardia   . Hx of CABG   . Ejection fraction   . Carotid artery disease (HCC)   . Tuberculosis   . OXYGEN-USE OF SUPPLEMENTAL 08/02/2009    Past Medical History: Past Medical History  Diagnosis Date  . CAD (coronary artery disease)     a. s/p CABG x5 (2005) b. s/p BMS-prox SVG-D2 (2014)  . Hypertension   . Hyperlipidemia   . GI bleed 2008    AVMs  . Aneurysm, aortic (HCC)     thoracic aorta, stable at 4.1 cm, chest CT, May, 2012  . Syncope     Nitroglycerin plus a diuretic, April, 2009  . Hyponatremia     Chronic. Felt secondary to SIADH   . COPD (chronic obstructive pulmonary disease) (HCC)     on home oxygen  . Tobacco abuse   . Bradycardia   . Carotid artery disease (HCC)     Hx of endarterectomy. Doppler October, 2011, stable, 0-39% RIC A., 40-59% LICA  . Tuberculosis     Exposures to tuberculosis 1970s, has  tested negative by the health Department  . Atrial fibrillation (HCC)     Paroxysmal with RVR 07/2011, not felt to be a coumadin candidate secondary to hx of GIB/AVM  . Venous stasis of lower extremity     Chronic  . CHF with left ventricular diastolic dysfunction, NYHA class 2 (HCC)     a. EF 55-60%, echo, April, 2013 b. EF 55-60%, mild biatrial enlargement and PASP 42 mmH  . AAA (abdominal aortic aneurysm) (HCC)   . Complication of anesthesia   . Hx of CABG     CABG 2005  . Hematoma     Right groin hematoma, small, post cath, April, 2014  . Mitral regurgitation     Mild, hospital, March, 2014  . Leg ulcer (HCC)     Ulcerated lesion on the anterior aspect of her left lower leg, June, 2014    Past Surgical History: Past Surgical History  Procedure Laterality  Date  . Cholecystectomy    . Carotid endartercetomy    . Abdominal hysterectomy    . Coronary artery bypass graft  2005  . Coronary angioplasty with stent placement  07/18/12    severe native coronary artery disease, 99% proximal stenosis of the SVG-D2 status post successful PTCA/PCI with a Veriflex bare-metal stent, widely patent SVGs to both the right PDA sequential OM1 to OM 2, EF 65-70% and 3+ mitral regurgitation  . Left heart catheterization with coronary angiogram N/A 07/18/2012    Procedure: LEFT HEART CATHETERIZATION WITH CORONARY ANGIOGRAM;  Surgeon: Marykay Lex, MD;  Location[facility] CATH LAB;  Service: Cardiovascular;  Laterality: N/A;  . Percutaneous coronary stent intervention (pci-s)  07/18/2012    Procedure: PERCUTANEOUS CORONARY STENT INTERVENTION (PCI-S);  Surgeon: Marykay Lex, MD;  Location: [facility] CATH LAB;  Service: Cardiovascular;;  . Ercp N/A 11/20/2014    Procedure: ENDOSCOPIC RETROGRADE CHOLANGIOPANCREATOGRAPHY (ERCP);  Surgeon: Meryl Dare, MD;  Location: [facility] ENDOSCOPY;  Service: Endoscopy;  Laterality: N/A;    Social History: Social History  Substance Use Topics  . Smoking status: Former Smoker -- 0.50 packs/day for 59 years    Types: Cigarettes    Quit date: 02/01/2013  . Smokeless tobacco: None  . Alcohol Use: No   Additional social history: none  Please also refer to relevant sections of EMR.  Family History: Family History  Problem Relation Age of Onset  . Stroke Sister   . Heart failure Mother   . Cancer Sister     Breast and Lung   . Cancer Brother     breast cancer  . Heart attack Neg Hx   . Hypertension Neg Hx     Allergies and Medications: Allergies  Allergen Reactions  . Other Other (See Comments)    TETANUS-can only take 1/2 dose at one time, can take the other 1/2 dose about 3 days later  . Codeine Nausea And Vomiting  . Sulfonamide Derivatives Nausea And Vomiting  . Warfarin     H/o AVM's prechief complaintcluding anticoagulation   . Penicillins Itching, Rash and Other (See Comments)    Nasal itching   . Ranitidine Hcl Itching and Rash   No current facility-administered medications on file prior to encounter.   Current Outpatient Prescriptions on File Prior to Encounter  Medication Sig Dispense Refill  . albuterol (PROVENTIL HFA;VENTOLIN HFA) 108 (90 BASE) MCG/ACT inhaler Inhale 2 puffs into the lungs every 4 (four) hours as needed for wheezing or shortness of breath. 1 Inhaler 5  . albuterol (PROVENTIL) (  2.5 MG/3ML) 0.083% nebulizer solution Take 2.5 mg by nebulization every 6 (six) hours as needed for wheezing.    Marland Kitchen aspirin EC 81 MG tablet Take 81 mg by mouth daily.    Marland Kitchen atorvastatin (LIPITOR) 40 MG tablet Take 0.5 tablets (20 mg total) by mouth daily at 6 PM. 90 tablet 1  . diltiazem (CARDIZEM CD) 120 MG 24 hr capsule Take 1 capsule (120 mg total) by mouth daily. 90 capsule 1  . furosemide (LASIX) 40 MG tablet Take 1 tablet (40 mg total) by mouth daily. 90 tablet 3  . insulin aspart (NOVOLOG) 100 UNIT/ML injection Before each meal 3 times a day, 140-199 - 2 units, 200-250 - 4 units, 251-299 - 6 units,  300-349 - 8 units,  350 or above 10 units. Dispense syringes and needles as needed, Ok to switch to PEN if approved. Substitute to any brand approved. DX DM2, Code E11.65 1 vial 12  . insulin glargine (LANTUS) 100 UNIT/ML injection Inject 0.15 mLs (15 Units total) into the skin daily. (Patient taking differently: Inject 15 Units into the skin at bedtime. ) 10 mL 11  . magnesium oxide (MAG-OX) 400 MG tablet Take 400 mg by mouth daily.    . metoprolol succinate (TOPROL-XL) 25 MG 24 hr tablet Take 1 tablet (25 mg total) by mouth daily. 90 tablet 3  . nitroGLYCERIN (NITROSTAT) 0.4 MG SL tablet Place 0.4 mg under the tongue every 5 (five) minutes as needed for chest pain.    Marland Kitchen ondansetron (ZOFRAN) 8 MG tablet Take 1 tablet (8 mg total) by mouth every 8 (eight) hours as needed for nausea or vomiting. 20 tablet 0  . OXYGEN  Inhale into the lungs. 2L continously    . pantoprazole (PROTONIX) 40 MG tablet Take 1 tablet (40 mg total) by mouth daily. 90 tablet 3  . polyethylene glycol (MIRALAX / GLYCOLAX) packet Take 17 g by mouth daily as needed for mild constipation or moderate constipation.     . potassium chloride SA (K-DUR,KLOR-CON) 20 MEQ tablet Take 20 mEq by mouth 2 (two) times daily.    Marland Kitchen senna (SENOKOT) 8.6 MG TABS tablet Take 8.6 mg by mouth daily as needed for mild constipation or moderate constipation.     Marland Kitchen SPIRIVA HANDIHALER 18 MCG inhalation capsule INHALE THE CONTENTS OF 1 CAPSULE DAILY 90 capsule 0  . SYMBICORT 160-4.5 MCG/ACT inhaler USE 2 INHALATIONS TWICE A DAY 30.6 g 0  . ALPRAZolam (XANAX) 0.25 MG tablet Take 1 tablet (0.25 mg total) by mouth at bedtime as needed for anxiety. (Patient not taking: Reported on 03/18/2015) 10 tablet 0  . predniSONE (DELTASONE) 5 MG tablet Take 2 tablets daily through September 2016; then On October 1st, decrease to 1 tablet daily (Patient not taking: Reported on 03/18/2015) 90 tablet 1  . Tiotropium Bromide Monohydrate (SPIRIVA RESPIMAT) 2.5 MCG/ACT AERS Inhale 2 puffs into the lungs daily. (Patient not taking: Reported on 03/18/2015) 4 g 5    Objective: BP 119/57 mmHg  Pulse 107  Temp(Src) 97.9 F (36.6 C) (Oral)  Resp 22  Ht 6' (1.829 m)  Wt 175 lb 4.8 oz (79.516 kg)  BMI 23.77 kg/m2  SpO2 91% Exam: General -- Awake, alert, oriented x3, NAD HEENT -- Head is normocephalic. PERRLA. EOMI. Ears, nose and throat were benign. Integument -- various excoriations and ecchymoses present. 1x1cm lesions on anterior Rt should and lower leg w/ irregular boarder and coloration. Chest -- poor expansion. Bilateral rhonchi noted at the bases. Cardiac --  irregularly irregular. No murmur appreciated Abdomen -- soft, nontender. No masses palpable. Bowel sounds present. CNS -- cranial nerves II through XII grossly intact. 2+ reflexes bilaterally. Extremeties - no tenderness or  effusions noted. ROM good. 5/5 bilateral strength. Dorsalis pedis pulses present and symmetrical. Onychomycosis of multiple nailbeds bilaterally   Labs and Imaging: CBC BMET   Recent Labs Lab 03/18/15 1605  WBC 12.3*  HGB 10.9*  HCT 35.7*  PLT 199    Recent Labs Lab 03/18/15 1605  NA 138  K 4.8  CL 98*  CO2 30  BUN 32*  CREATININE 1.43*  GLUCOSE 110*  CALCIUM 10.0     CXR 11/21 IMPRESSION: 1. Perhaps slightly increased patchy nonspecific opacities in both lung bases which may represent atelectasis or infiltrate. 2. Otherwise, stable appearance of chronic bronchitic change, pulmonary vascular congestion and interstitial prominence compared to prior imaging.   Kathee DeltonIan D Robinson Brinkley, MD 03/18/2015, 10:52 PM PGY-2, [place] FPTS Intern pager: [phone], text pages welcome

## 2015-03-18 NOTE — Progress Notes (Addendum)
ANTIBIOTIC CONSULT NOTE - INITIAL  Pharmacy Consult for vanc/ceftaz Indication: sepsis  Allergies  Allergen Reactions  . Other Other (See Comments)    TETANUS-can only take 1/2 dose at one time, can take the other 1/2 dose about 3 days later  . Codeine Nausea And Vomiting  . Sulfonamide Derivatives Nausea And Vomiting  . Warfarin     H/o AVM's prechief complaintcluding anticoagulation  . Penicillins Itching, Rash and Other (See Comments)    Nasal itching   . Ranitidine Hcl Itching and Rash    Patient Measurements:     Vital Signs: Temp: 99.7 F (37.6 C) (11/21 1613) Temp Source: Rectal (11/21 1613) BP: 112/72 mmHg (11/21 1905) Pulse Rate: 126 (11/21 1905) Intake/Output from previous day:   Intake/Output from this shift:    Labs:  Recent Labs  03/18/15 1605  WBC 12.3*  HGB 10.9*  PLT 199  CREATININE 1.43*   CrCl cannot be calculated (Unknown ideal weight.). No results for input(s): VANCOTROUGH, VANCOPEAK, VANCORANDOM, GENTTROUGH, GENTPEAK, GENTRANDOM, TOBRATROUGH, TOBRAPEAK, TOBRARND, AMIKACINPEAK, AMIKACINTROU, AMIKACIN in the last 72 hours.   Microbiology: No results found for this or any previous visit (from the past 720 hour(s)).  Medical History: Past Medical History  Diagnosis Date  . CAD (coronary artery disease)     a. s/p CABG x5 (2005) b. s/p BMS-prox SVG-D2 (2014)  . Hypertension   . Hyperlipidemia   . GI bleed 2008    AVMs  . Aneurysm, aortic (HCC)     thoracic aorta, stable at 4.1 cm, chest CT, May, 2012  . Syncope     Nitroglycerin plus a diuretic, April, 2009  . Hyponatremia     Chronic. Felt secondary to SIADH   . COPD (chronic obstructive pulmonary disease) (HCC)     on home oxygen  . Tobacco abuse   . Bradycardia   . Carotid artery disease (HCC)     Hx of endarterectomy. Doppler October, 2011, stable, 0-39% RIC A., 40-59% LICA  . Tuberculosis     Exposures to tuberculosis 1970s, has tested negative by the health Department  .  Atrial fibrillation (HCC)     Paroxysmal with RVR 07/2011, not felt to be a coumadin candidate secondary to hx of GIB/AVM  . Venous stasis of lower extremity     Chronic  . CHF with left ventricular diastolic dysfunction, NYHA class 2 (HCC)     a. EF 55-60%, echo, April, 2013 b. EF 55-60%, mild biatrial enlargement and PASP 42 mmH  . AAA (abdominal aortic aneurysm) (HCC)   . Complication of anesthesia   . Hx of CABG     CABG 2005  . Hematoma     Right groin hematoma, small, post cath, April, 2014  . Mitral regurgitation     Mild, hospital, March, 2014  . Leg ulcer (HCC)     Ulcerated lesion on the anterior aspect of her left lower leg, June, 2014    Assessment: [age] yof in ED with code sepsis. Pharmacy consulted to dose vanc/ceftaz. Code Sepsis called at 1912. Hand-delivered ceftaz to RN at [phone] with instructions to give 1st. Afeb, wbc 12.3, CrCl~38.  11/21 vanc>> 11/21 ceftaz>>  11/21 BCx2>> 11/21 UC>>  Goal of Therapy:  Vancomycin trough level 15-20 mcg/ml  Plan:  Ceftazidime 2g IV q12h Vanc 1.5g IV x 1 dose; then Vanc 1g IV q24h Monitor clinical progress, c/s, renal function, abx plan/LOT VT@SS  as indicated  Babs BertinHaley Ryla Cauthon, PharmD Clinical Pharmacist Pager [phone] 03/18/2015 7:29 PM

## 2015-03-19 ENCOUNTER — Encounter (HOSPITAL_COMMUNITY): Payer: Self-pay | Admitting: Student

## 2015-03-19 DIAGNOSIS — I482 Chronic atrial fibrillation: Secondary | ICD-10-CM

## 2015-03-19 DIAGNOSIS — I4891 Unspecified atrial fibrillation: Secondary | ICD-10-CM

## 2015-03-19 DIAGNOSIS — J441 Chronic obstructive pulmonary disease with (acute) exacerbation: Principal | ICD-10-CM

## 2015-03-19 DIAGNOSIS — J9601 Acute respiratory failure with hypoxia: Secondary | ICD-10-CM | POA: Insufficient documentation

## 2015-03-19 LAB — CBC
HCT: 31.3 % — ABNORMAL LOW (ref 36.0–46.0)
HEMOGLOBIN: 9.3 g/dL — AB (ref 12.0–15.0)
MCH: 27.6 pg (ref 26.0–34.0)
MCHC: 29.7 g/dL — AB (ref 30.0–36.0)
MCV: 92.9 fL (ref 78.0–100.0)
Platelets: 170 10*3/uL (ref 150–400)
RBC: 3.37 MIL/uL — ABNORMAL LOW (ref 3.87–5.11)
RDW: 14.2 % (ref 11.5–15.5)
WBC: 9.2 10*3/uL (ref 4.0–10.5)

## 2015-03-19 LAB — TROPONIN I
TROPONIN I: 0.06 ng/mL — AB (ref <0.031)
TROPONIN I: 0.04 ng/mL — AB (ref <0.031)

## 2015-03-19 LAB — GLUCOSE, CAPILLARY
GLUCOSE-CAPILLARY: 118 mg/dL — AB (ref 65–99)
GLUCOSE-CAPILLARY: 131 mg/dL — AB (ref 65–99)
Glucose-Capillary: 174 mg/dL — ABNORMAL HIGH (ref 65–99)
Glucose-Capillary: 160 mg/dL — ABNORMAL HIGH (ref 65–99)

## 2015-03-19 LAB — BASIC METABOLIC PANEL
ANION GAP: 7 (ref 5–15)
BUN: 34 mg/dL — AB (ref 6–20)
CALCIUM: 9.7 mg/dL (ref 8.9–10.3)
CO2: 29 mmol/L (ref 22–32)
CREATININE: 1.46 mg/dL — AB (ref 0.44–1.00)
Chloride: 99 mmol/L — ABNORMAL LOW (ref 101–111)
GFR calc Af Amer: 37 mL/min — ABNORMAL LOW (ref >60)
GFR, EST NON AFRICAN AMERICAN: 32 mL/min — AB (ref >60)
GLUCOSE: 170 mg/dL — AB (ref 65–99)
Potassium: 5.6 mmol/L — ABNORMAL HIGH (ref 3.5–5.1)
Sodium: 135 mmol/L (ref 135–145)

## 2015-03-19 MED ORDER — BOOST / RESOURCE BREEZE PO LIQD
1.0000 | Freq: Three times a day (TID) | ORAL | Status: DC
  Administered 2015-03-20 (×2): 1 via ORAL

## 2015-03-19 MED ORDER — POTASSIUM CHLORIDE CRYS ER 20 MEQ PO TBCR: 20.0000 meq | EXTENDED_RELEASE_TABLET | Freq: Two times a day (BID) | ORAL | Status: DC

## 2015-03-19 MED ORDER — DILTIAZEM HCL ER COATED BEADS 120 MG PO CP24
240.0000 mg | ORAL_CAPSULE | Freq: Every day | ORAL | Status: DC
  Administered 2015-03-20 – 2015-03-27 (×8): 240 mg via ORAL
  Filled 2015-03-19: qty 2
  Filled 2015-03-19 (×3): qty 1
  Filled 2015-03-19: qty 2
  Filled 2015-03-19 (×4): qty 1

## 2015-03-19 MED ORDER — DILTIAZEM HCL ER COATED BEADS 120 MG PO CP24
120.0000 mg | ORAL_CAPSULE | Freq: Every day | ORAL | Status: AC
  Administered 2015-03-19: 120 mg via ORAL
  Filled 2015-03-19: qty 1

## 2015-03-19 MED ORDER — PREDNISONE 50 MG PO TABS
50.0000 mg | ORAL_TABLET | Freq: Every day | ORAL | Status: DC
  Administered 2015-03-19 – 2015-03-23 (×5): 50 mg via ORAL
  Filled 2015-03-19 (×5): qty 1

## 2015-03-19 NOTE — Progress Notes (Signed)
Family Medicine Teaching Service Daily Progress Note Intern Pager: [phone]  Patient name: [name] Medical record number: [id number] Date of birth: 02/23/1930 Age: 81 y.o. Gender: female  Primary Care Provider: Kaleen Mask, MD Consultants: none Code Status: FULL  Pt Overview and Major Events to Date:  11/21: admitted for dyspnea likely due to COPD exacerbation  Assessment and Plan: [name] is a 81 y.o. female presenting with dyspnea and cough. PMH is significant for COPD (on home oxygen, 2 L), A. fib with RVR (not on anticoad 2/2 hx GI bleed 2/2 AVMs), chronic diastolic CHF, s/p CABG, CAD, PAD, aortic valve sclerosis, mitral regurgitation, hypertension, hyperlipidemia, DM2, aortic aneurysm, and tobacco abuse. Patient's current presentation most consistent with an acute COPD exacerbation. However, cannot rule out at this time HCAP (due to recent hospitalization), CHF exacerbation (CXR results, mildly elevated BNP, and neg Troponin makes this less likely), or PE (AFib places her at risk but lab evidence of heart strain is not present. Also Well's Score 1.5). Will treat for COPD exacerbation at this time.  Dyspnea, likely secondary to COPD exacerbation: Gold D COPD; On 2 L of oxygen at home. Required Venturi mask in ED. ED initially treated for HCAP, providing 1 dose of vancomycin and Fortaz. Unlikely infectious process as currently afebrile., leukocytosis on admit but now wnl (9.2), and, no specific region of consolidation on chest x-ray. On 3 L [place] with good saturations, no wheezing on exam, slightly decreased breath sounds near the bases but also note crackles bilaterally. Not concerning for ACS with no acute changes in EKG and unremarkable troponins. Considered CHF however, BNP is only mildly elevated, CXR not concerning for pulmonary edema. Possibly contributed by [employer] with RVR.  - CXR on admit: flat diaphragms, hyperinflation; slightly increased patchy nonspecific opacities  in both lung bases- atelectasis vs infiltrate - Levaquin per pharmacy - Prednisone 50 mg daily - Continue home Spiriva, Symbicort, albuterol nebulizer PRN. - Supplemental O2 to keep saturations above 92% (will contact nursing to ensure saturations are titrated down as to not persist at 100%.) - ABG: pH 7.39, CO2 43.9, Bicarb 26.9 - BNP: 153.9; i-STAT troponin: 0.01; Troponin 0.04 > 0.06 - ED obtained blood and urine cultures: pending - if symptoms improve with control of Afib with RVR, should consider discontinuing Abx and Prednisone due to reasons noted above - PT consulted  A. fib with RVR: Patient in atrial fibrillation and tachycardic to the 110s on admission. Blood pressure stable in 101-134/54-94. Patient is not currently on anticoagulation due to previous GI bleed due to AVM (CHADS2 4). Patient is followed by Dr. [facility]jeana [facility] at [facility]. - On telemetry - AM EKG: AFib with increased HR, LAD; no significant change from prior - Cardiology consulted, appreciate recs: Increase Cardizem to  daily; discontinue Toprol XL - Continue Lasix 40 mg daily, aspirin 81 mg daily  Mild Hyperkalemia: 5.6 (from 4.8 on admit) - will monitor - held home Kdur   Mild AKI: (baseline Cr 1.1-1.2) - Cr 1.46 (stable from admission)  - will continue to monitor  Asymptomatic Bacteruria:  - UA: trace LE, Pos Nitrites, many bacteria, WBC 6-30 - urine culture from ED - treatment not indicated   Normocytic Anemia: Hgb on admit 10.9. 9.3 this AM (Baseline ~9) - will continue to monitor   Chronic diastolic CHF/CABG (2005)/CAD/PAD/aortic valve sclerosis/mitral regurgitation/hypertension: CHF last EF of 55%- 60% - As above. - ASA - Lipitor - Home nitroglycerin tablet when necessary for chest pain  Type  2 diabetes: Patient on injectable insulin at home. CBG 110 on admission. - Lantus 10 units daily (home dose 15 units daily) - Sliding-scale insulin sensitive  Hyperlipidemia - Continue  Lipitor 20 mg daily  FEN/GI: IV fluids KVO, heart healthy/carb modified diet; Protonix PO Prophylaxis: Lovenox  Disposition: Admit to teaching service for management of COPD exacerbation   Subjective:  - states breathing is stable from discharge - notes of some chest tightness that is constant; patient cannot remember when symptoms started  - no palpitations  Objective: Temp:  [97.6 F (36.4 C)-99.7 F (37.6 C)] 97.6 F (36.4 C) (11/22 1147) Pulse Rate:  [34-126] 89 (11/22 1147) Resp:  [15-37] 18 (11/22 1147) BP: (86-134)/(47-94) 114/64 mmHg (11/22 1147) SpO2:  [89 %-100 %] 99 % (11/22 1147) FiO2 (%):  [0 %-45 %] 0 % (11/21 2121) Weight:  [175 lb 4.8 oz (79.516 kg)-175 lb 14.8 oz (79.8 kg)] 175 lb 14.8 oz (79.8 kg) (11/22 0451) Physical Exam: General -- NAD Chest --  Diminished breath sounds near the bases, however notes of crackles at bases with R > L  Cardiac -- irregularly irregular . No murmur appreciated Abdomen -- soft, nontender. No masses palpable. Bowel sounds present. CNS -- cranial nerves II through XII grossly intact. 2+ reflexes bilaterally. Extremeties - no tenderness or effusions noted.  Laboratory:  Recent Labs Lab 03/18/15 1605 03/19/15 0304  WBC 12.3* 9.2  HGB 10.9* 9.3*  HCT 35.7* 31.3*  PLT 199 170    Recent Labs Lab 03/18/15 1605 03/19/15 0304  NA 138 135  K 4.8 5.6*  CL 98* 99*  CO2 30 29  BUN 32* 34*  CREATININE 1.43* 1.46*  CALCIUM 10.0 9.7  GLUCOSE 110* 170*    Palma HolterKanishka G Gunadasa, MD 03/19/2015, 12:34 PM PGY-1, [facility] FPTS Intern pager: [phone], text pages welcome

## 2015-03-19 NOTE — Progress Notes (Signed)
Attempted to see pt but pts troponin level is trending upward.  Will defer this evaluation until tomorrow. Tory EmeraldHolly Eshal Propps, [place] Carolina[place]/L [phone]

## 2015-03-19 NOTE — Progress Notes (Signed)
Pt is not a good historian. Admission history to be obtained while patient's family is present.

## 2015-03-19 NOTE — Evaluation (Signed)
Physical Therapy Evaluation Patient Details Name: [name] MRN: [id number] DOB: January 22, 1930 Today's Date: 03/19/2015   History of Present Illness  [name] is a 81 y.o. female presenting with dyspnea and cough. PMH is significant for COPD (on home oxygen, 2 L), A. fib with RVR, chronic diastolic CHF, s/p CABG, CAD, PAD, aortic valve sclerosis, mitral regurgitation, hypertension, hyperlipidemia, DM2, aortic aneurysm, and tobacco abuse.  Admitted with COPD exacerbation and a-fib w/ RVR.  Clinical Impression  Patient presents with decreased independence with mobility due to deficits listed in PT problem list.  She will benefit from skilled PT in the acute setting to allow return home with family support and HHPT.    Follow Up Recommendations Home health PT;Supervision for mobility/OOB    Equipment Recommendations  None recommended by PT    Recommendations for Other Services       Precautions / Restrictions Precautions Precautions: Fall Precaution Comments: watch HR      Mobility  Bed Mobility Overal bed mobility: Modified Independent             General bed mobility comments: for supine to sit and sit to supine, assist for scooting up in bed  Transfers Overall transfer level: Needs assistance Equipment used: Rolling walker (2 wheeled) Transfers: Sit to/from Stand Sit to Stand: Min guard         General transfer comment: due to severe dyspnea with any mobility  Ambulation/Gait Ambulation/Gait assistance: Min assist Ambulation Distance (Feet): 30 Feet Assistive device: Rolling walker (2 wheeled) Gait Pattern/deviations: Step-through pattern;Trunk flexed     General Gait Details: to door and back to bed with pt severely dyspneic  Stairs            Wheelchair Mobility    Modified Rankin (Stroke Patients Only)       Balance Overall balance assessment: Needs assistance   Sitting balance-Leahy Scale: Good       Standing balance-Leahy  Scale: Poor Standing balance comment: reports LE weakness                             Pertinent Vitals/Pain Pain Assessment: No/denies pain    Home Living Family/patient expects to be discharged to:: Private residence Living Arrangements: Other relatives (grandson) Available Help at Discharge: Available PRN/intermittently;Family Type of Home: House Home Access: Ramped entrance     Home Layout: One level Home Equipment: Cane - single point;Hospital bed;Walker - 2 wheels;Walker - standard;Walker - 4 wheels;Bedside commode;Shower seat;Grab bars - tub/shower;Other (comment) Additional Comments: daughter in law does pt errands and cleaning.      Prior Function Level of Independence: Independent         Comments: occasionally uses walker when more SOB or if going out      Hand Dominance   Dominant Hand: Right    Extremity/Trunk Assessment               Lower Extremity Assessment: Generalized weakness         Communication   Communication: No difficulties  Cognition Arousal/Alertness: Awake/alert Behavior During Therapy: WFL for tasks assessed/performed Overall Cognitive Status: Within Functional Limits for tasks assessed                      General Comments General comments (skin integrity, edema, etc.): HR range 111-150 with mobility, SpO2 90-97%on 3 L O2    Exercises        Assessment/Plan  PT Assessment Patient needs continued PT services  PT Diagnosis Generalized weakness   PT Problem List Decreased balance;Decreased mobility  PT Treatment Interventions DME instruction;Balance training;Gait training;Functional mobility training;Patient/family education;Therapeutic activities;Therapeutic exercise   PT Goals (Current goals can be found in the Care Plan section) Acute Rehab PT Goals Patient Stated Goal: To go home PT Goal Formulation: With patient Time For Goal Achievement: 04/02/15 Potential to Achieve Goals: Good     Frequency Min 3X/week   Barriers to discharge        Co-evaluation               End of Session Equipment Utilized During Treatment: Oxygen Activity Tolerance: Patient limited by fatigue Patient left: in bed;with call bell/phone within reach           Time: 1340-1405 PT Time Calculation (min) (ACUTE ONLY): 25 min   Charges:   PT Evaluation $Initial PT Evaluation Tier I: 1 Procedure PT Treatments $Gait Training: 8-22 mins   PT G Codes:        Deago Burruss,CYNDI 29-Mar-2015, 2:14 PM Sheran Lawless, PT [phone] 2015/03/29

## 2015-03-19 NOTE — Progress Notes (Signed)
Utilization review completed. Andric Kerce, RN, BSN. 

## 2015-03-19 NOTE — Progress Notes (Signed)
Occupational Therapy Evaluation Patient Details Name: [name] MRN: [id number] DOB: Aug 23, 1929 Today's Date: 03/19/2015    History of Present Illness [name] is a 81 y.o. female presenting with dyspnea and cough. PMH is significant for COPD (on home oxygen, 2 L), A. fib with RVR, chronic diastolic CHF, s/p CABG, CAD, PAD, aortic valve sclerosis, mitral regurgitation, hypertension, hyperlipidemia, DM2, aortic aneurysm, and tobacco abuse.  Admitted with COPD exacerbation and a-fib w/ RVR.   Clinical Impression   PTA, pt mod I with mobility and ADL. Family assists with IADL tasks. Pt limited by dyspnea. HR 88-133 during session. Only able to walk around to recliner ( ) and stated she felt she could not breath (O2 3.5L). Pt appears to have good social support. Pt will be able to d/C home with initial 24/7 S. Will follow acutely to address established goals.     Follow Up Recommendations  No OT follow up;Supervision/Assistance - 24 hour (initially; then intermittent)    Equipment Recommendations  None recommended by OT    Recommendations for Other Services       Precautions / Restrictions Precautions Precautions: Fall Precaution Comments: watch HR      Mobility Bed Mobility Overal bed mobility: Modified Independent (with HOB elevated)              Transfers Overall transfer level: Needs assistance Equipment used: Rolling walker (2 wheeled) Transfers: Sit to/from Stand Sit to Stand: Min guard         General transfer comment: due to severe dyspnea with any mobility    Balance Overall balance assessment: Needs assistance   Sitting balance-Leahy Scale: Good       Standing balance-Leahy Scale: Poor Standing balance comment: fear of falling                            ADL Overall ADL's : Needs assistance/impaired     Grooming: Set up;Supervision/safety;Sitting   Upper Body Bathing: Supervision/ safety;Set up;Sitting   Lower Body  Bathing: Minimal assistance;Sit to/from stand   Upper Body Dressing : Supervision/safety;Set up;Sitting   Lower Body Dressing: Minimal assistance;Sit to/from stand   Toilet Transfer: RW;BSC;Min guard   Toileting- Architect and Hygiene: Supervision/safety       Functional mobility during ADLs: Min guard;Rolling walker General ADL Comments: Pt states she is able to complete her ADL at baseline, however, she gets out of breath. Pt states she is usually able to function better than she is now. Began educating pt on pursed lip breathing and energy conservation. Pt with 3/4 dypsnea with minimal activity.     Vision     Perception     Praxis      Pertinent Vitals/Pain Pain Assessment: No/denies pain     Hand Dominance Right   Extremity/Trunk Assessment Upper Extremity Assessment Upper Extremity Assessment: Generalized weakness   Lower Extremity Assessment Lower Extremity Assessment: Defer to PT evaluation   Cervical / Trunk Assessment Cervical / Trunk Assessment: Kyphotic   Communication Communication Communication: No difficulties   Cognition Arousal/Alertness: Awake/alert Behavior During Therapy: WFL for tasks assessed/performed Overall Cognitive Status: Within Functional Limits for tasks assessed                     General Comments       Exercises       Shoulder Instructions      Home Living Family/patient expects to be discharged to:: Private residence Living  Arrangements: Other relatives (grandson) Available Help at Discharge: Available PRN/intermittently;Family Type of Home: House Home Access: Ramped entrance     Home Layout: One level     Bathroom Shower/Tub: Chief Strategy OfficerTub/shower unit   Bathroom Toilet: Standard Bathroom Accessibility: Yes How Accessible: Accessible via walker Home Equipment: Cane - single point;Hospital bed;Walker - 2 wheels;Walker - standard;Walker - 4 wheels;Bedside commode;Shower seat;Grab bars - tub/shower;Other  (comment)   Additional Comments: daughter in law does pt errands and cleaning.        Prior Functioning/Environment Level of Independence: Independent with assistive device(s)        Comments: occasionally uses walker when more SOB or if going out  (daughter assists with IADL tasks)    OT Diagnosis: Generalized weakness   OT Problem List: Decreased activity tolerance;Decreased knowledge of use of DME or AE;Cardiopulmonary status limiting activity   OT Treatment/Interventions: Self-care/ADL training;Energy conservation;DME and/or AE instruction;Therapeutic activities;Patient/family education    OT Goals(Current goals can be found in the care plan section) Acute Rehab OT Goals Patient Stated Goal: to be able to breath better OT Goal Formulation: With patient Time For Goal Achievement: 04/02/15 Potential to Achieve Goals: Good ADL Goals Pt Will Perform Lower Body Bathing: with set-up;with supervision;sit to/from stand Pt Will Perform Lower Body Dressing: with set-up;with supervision;sit to/from stand Pt Will Transfer to Toilet: ambulating;with supervision;bedside commode Pt Will Perform Toileting - Clothing Manipulation and hygiene: with modified independence Additional ADL Goal #1: Pt will verbalize 3 energy conservation techniques independently  OT Frequency: Min 2X/week   Barriers to D/C:            Co-evaluation              End of Session Equipment Utilized During Treatment: Gait belt;Rolling walker;Oxygen (3.5 L) Nurse Communication: Mobility status  Activity Tolerance: Patient limited by fatigue Patient left: in chair;with call bell/phone within reach   Time: 1512-1540 OT Time Calculation (min): 28 min Charges:  OT General Charges $OT Visit: 1 Procedure OT Evaluation $Initial OT Evaluation Tier I: 1 Procedure OT Treatments $Self Care/Home Management : 8-22 mins G-Codes:    Veda Arrellano,HILLARY 03/19/2015, 4:10 PM   [facility] Natisha Trzcinski, OTR/L   [phone] 03/19/2015

## 2015-03-19 NOTE — Consult Note (Signed)
CARDIOLOGY CONSULT NOTE   Patient ID: [name] MRN: [id number], DOB/AGE: 06-01-1929   Admit date: 03/18/2015 Date of Consult: 03/19/2015 Reason for  Consult: Atrial Fibrillation w/ RVR   Primary Physician: [name],[name], MD Primary Cardiologist: Dr. Myrtis SerKatz/ Now Dr. Delton SeeNelson  HPI: [name] is a 81 y.o. female with past medical history of CAD (s/p CABG x5 in 2005, BMS to SVG-D2 in 2014), HTN, HLD, Type 2 DM COPD (on home O2), chronic diastolic CHF, carotid artery disease, and chronic atrial fibrillation (not on anticoagulation due to history of GI bleed) who [facility] for new-onset shortness of breath over the past 3 days. She is currently being treated for a COPD exacerbation.  On examination today, she says her breathing has improved from yesterday. Denies any chest pain or palpitations. Is aware of how much O2 she is on at home (2L) but says she "does not need many medications and has only one prescription she takes regularly". However, she has over 20 medications on her home med list. Unfortunately, no family members are present during the encounter to elaborate on her medical condition at the time of admission or prior.  She has been started on Levaquin for her COPD exacerbation. Her O2 is currently at 3L via nasal cannula. CXR showed patchy nonspecific opacities in both lung bases which may represent atelectasis or infiltrate. Her BNP was at 153 at time of admission, consistent with previous values. Troponin has been flat at 0.04 and 0.06. On telemetry, she remains in atrial fibrillation with rate in the 110's - 150's. Her BP has been soft at times with an overall range of 86/47 - 134/94 since admitted.  She was last seen in the office by Nada BoozerLaura Ingold, NP in 11/2014 for post-hospital visit during which she developed atrial fibrillation with RVR in the setting of sepsis. She was continued on Diltiazem CD 120mg  daily along with Toprol-XL 25mg   daily. Her HR at time of visit was 111 but was noted to be less than 100 after a period of rest.  She currently lives by herself and has a grandson who stays with her at night. Her son lives nearby. They help with her activities of daily living.   Problem List Past Medical History  Diagnosis Date  . CAD (coronary artery disease)     a. s/p CABG x5 (2005) b. s/p BMS-prox SVG-D2 (2014)  . Hypertension   . Hyperlipidemia   . GI bleed 2008    AVMs  . Aneurysm, aortic (HCC)     thoracic aorta, stable at 4.1 cm, chest CT, May, 2012  . Syncope     Nitroglycerin plus a diuretic, April, 2009  . Hyponatremia     Chronic. Felt secondary to SIADH   . COPD (chronic obstructive pulmonary disease) (HCC)     on home oxygen  . Tobacco abuse   . Bradycardia   . Carotid artery disease (HCC)     Hx of endarterectomy. Doppler October, 2011, stable, 0-39% RIC A., 40-59% LICA  . Tuberculosis     Exposures to tuberculosis 1970s, has tested negative by the health Department  . Atrial fibrillation (HCC)     Paroxysmal with RVR 07/2011, not felt to be a coumadin candidate secondary to hx of GIB/AVM  . Venous stasis of lower extremity     Chronic  . CHF with left ventricular diastolic dysfunction, NYHA class 2 (HCC)     a. EF 55-60%, echo, April,  2013 b. EF 55-60%, mild biatrial enlargement and PASP 42 mmH  . AAA (abdominal aortic aneurysm) (HCC)   . Complication of anesthesia   . Hx of CABG     CABG 2005  . Hematoma     Right groin hematoma, small, post cath, April, 2014  . Mitral regurgitation     Mild, hospital, March, 2014  . Leg ulcer (HCC)     Ulcerated lesion on the anterior aspect of her left lower leg, June, 2014    Past Surgical History  Procedure Laterality Date  . Cholecystectomy    . Carotid endartercetomy    . Abdominal hysterectomy    . Coronary artery bypass graft  2005  . Coronary angioplasty with stent placement  07/18/12    severe native coronary artery disease, 99% proximal  stenosis of the SVG-D2 status post successful PTCA/PCI with a Veriflex bare-metal stent, widely patent SVGs to both the right PDA sequential OM1 to OM 2, EF 65-70% and 3+ mitral regurgitation  . Left heart catheterization with coronary angiogram N/A 07/18/2012    Procedure: LEFT HEART CATHETERIZATION WITH CORONARY ANGIOGRAM;  Surgeon: Marykay Lex, MD;  Location: [facility] CATH LAB;  Service: Cardiovascular;  Laterality: N/A;  . Percutaneous coronary stent intervention (pci-s)  07/18/2012    Procedure: PERCUTANEOUS CORONARY STENT INTERVENTION (PCI-S);  Surgeon: Marykay Lex, MD;  Location: [facility] CATH LAB;  Service: Cardiovascular;;  . Ercp N/A 11/20/2014    Procedure: ENDOSCOPIC RETROGRADE CHOLANGIOPANCREATOGRAPHY (ERCP);  Surgeon: Meryl Dare, MD;  Location: [facility] ENDOSCOPY;  Service: Endoscopy;  Laterality: N/A;     Allergies Allergies  Allergen Reactions  . Other Other (See Comments)    TETANUS-can only take 1/2 dose at one time, can take the other 1/2 dose about 3 days later  . Codeine Nausea And Vomiting  . Sulfonamide Derivatives Nausea And Vomiting  . Warfarin     H/o AVM's prechief complaintcluding anticoagulation  . Penicillins Itching, Rash and Other (See Comments)    Nasal itching   . Ranitidine Hcl Itching and Rash      Inpatient Medications . aspirin EC  81 mg Oral Daily  . atorvastatin  20 mg Oral q1800  . budesonide-formoterol  2 puff Inhalation BID  . diltiazem  120 mg Oral Daily  . enoxaparin (LOVENOX) injection  40 mg Subcutaneous Q24H  . furosemide  40 mg Oral Daily  . insulin aspart  0-9 Units Subcutaneous TID WC  . insulin glargine  10 Units Subcutaneous QHS  . levofloxacin (LEVAQUIN) IV  750 mg Intravenous Q48H  . metoprolol succinate  25 mg Oral Daily  . pantoprazole  40 mg Oral Daily  . predniSONE  50 mg Oral Q breakfast  . senna  1 tablet Oral BID  . sodium chloride  3 mL Intravenous Q12H  . tiotropium  1 capsule Inhalation Daily    Family  History Family History  Problem Relation Age of Onset  . Stroke Sister   . Heart failure Mother   . Cancer Sister     Breast and Lung   . Cancer Brother     breast cancer  . Heart attack Neg Hx   . Hypertension Neg Hx      Social History Social History   Social History  . Marital Status: Widowed    Spouse Name: N/A  . Number of Children: 1  . Years of Education: N/A   Occupational History  . retired     [employer]   Social  History Main Topics  . Smoking status: Former Smoker -- 0.50 packs/day for 59 years    Types: Cigarettes    Quit date: 02/01/2013  . Smokeless tobacco: Not on file  . Alcohol Use: No  . Drug Use: No  . Sexual Activity: Not on file   Other Topics Concern  . Not on file   Social History Narrative     Review of Systems General:  No chills, fever, night sweats or weight changes.  Cardiovascular:  No chest pain, edema, orthopnea, palpitations, paroxysmal nocturnal dyspnea. Positive for dyspnea on exertion. Dermatological: No rash, lesions/masses Respiratory: No cough, Positive for dyspnea Urologic: No hematuria, dysuria Abdominal:   No nausea, vomiting, diarrhea, bright red blood per rectum, melena, or hematemesis Neurologic:  No visual changes, wkns, changes in mental status. All other systems reviewed and are otherwise negative except as noted above.  Physical Exam Blood pressure 130/90, pulse 100, temperature 97.6 F (36.4 C), temperature source Oral, resp. rate 18, height 6' (1.829 m), weight 175 lb 14.8 oz (79.8 kg), SpO2 92 %.  General: Pleasant, elderly Caucasian female in NAD.  Psych: Normal affect. Neuro: Alert and oriented X 2. Moves all extremities spontaneously. HEENT: Normal  Neck: Supple without bruits or JVD. Lungs:  Resp regular and unlabored, Minimal rales at bases bilaterally. Heart: Irregularly irregular, tachycardiac rate, no s3, s4, or murmurs. Abdomen: Soft, non-tender, non-distended, BS + x 4.  Extremities: No clubbing,  cyanosis or edema. DP/PT/Radials 2+ and equal bilaterally.  Labs  Recent Labs  03/18/15 2259 03/19/15 0304  TROPONINI 0.04* 0.06*   Lab Results  Component Value Date   WBC 9.2 03/19/2015   HGB 9.3* 03/19/2015   HCT 31.3* 03/19/2015   MCV 92.9 03/19/2015   PLT 170 03/19/2015    Recent Labs Lab 03/19/15 0304  NA 135  K 5.6*  CL 99*  CO2 29  BUN 34*  CREATININE 1.46*  CALCIUM 9.7  GLUCOSE 170*    Radiology/Studies Dg Chest Portable 1 View: 03/18/2015  CLINICAL DATA:  81 year old female in respiratory distress. Clinical history of COPD. EXAM: PORTABLE CHEST 1 VIEW COMPARISON:  Prior chest x-ray 11/19/2014 FINDINGS: Stable cardiomegaly. Patient is status post median sternotomy with evidence of prior multivessel CABG. Similar appearance of the lungs with diffuse bilateral bronchitic change and interstitial prominence. Perhaps slightly increased patchy airspace opacity in both lung bases. No new focal airspace consolidation, pulmonary edema, pleural effusion or pneumothorax. Stable mild pulmonary vascular congestion. IMPRESSION: 1. Perhaps slightly increased patchy nonspecific opacities in both lung bases which may represent atelectasis or infiltrate. 2. Otherwise, stable appearance of chronic bronchitic change, pulmonary vascular congestion and interstitial prominence compared to prior imaging. Electronically Signed   By: Malachy Moan M.D.   On: 03/18/2015 16:51    ECG: Atrial fibrillation w/ RVR. Rate 105.   ECHOCARDIOGRAM: 11/19/2014 Study Conclusions - Left ventricle: The cavity size was normal. Wall thickness was increased in a pattern of mild LVH. Systolic function was normal. The estimated ejection fraction was in the range of 55% to 60%. Regional wall motion abnormalities cannot be excluded. - Mitral valve: There was mild regurgitation. - Left atrium: The atrium was moderately dilated. - Right atrium: The atrium was mildly dilated. - Pulmonary arteries: PA  peak pressure: 35 mm Hg (S).  ASSESSMENT AND PLAN  1. Atrial Fibrillation with RVR - has known history of chronic atrial fibrillation. Has been in atrial fibrillation since admission with HR in 110's - 150's. Was in atrial fibrillation w/  HR at 111 during office visit in 11/2014. - This patients CHA2DS2-VASc Score and unadjusted Ischemic Stroke Rate (% per year) is equal to 11.2 % stroke rate/year from a score of 7. (CHF, HTN, DM, Vascular, Female, Age > 46 x2). Has not been considered a good candidate for anticoagulation due to history of GIB and AVM's. - was on Toprol-XL  daily and Cardizem CD  daily PTA. - Will increase Cardizem CD from  to  daily. Will stop Torpol-XL at this time. Continue to monitor BP closely (BP has been 86/47 - 134/94 in the past 24 hours).  2. Elevated Troponin - cyclic troponin values have been 0.04 and 0.06. - were similar to these values during her previous hospitalization in 10/2014.  - likely to represent demand ischemia in setting of acute COPD exacerbation and atrial fibrillation with RVR.  3. History of CAD - s/p CABG x5 in 2005, BMS to SVG-D2 in 2014 - continue ASA, statin, and BB.  4. Chronic Diastolic CHF - EF 55-60% in 10/2014. - continue home Lasix dosage  5. Hyperkalemia - potassium at 5.6 on 03/19/2015. - home potassium supplementation has been discontinued at this time.  Signed, Ellsworth Lennox, PA-C 03/19/2015, 10:31 AM Pager: [phone]  Patient seen, examined. Available data reviewed. Agree with findings, assessment, and plan as outlined by Randall An, PA-C. The patient is an elderly woman in NAD on O2. She is sitting up in bed eating lunch. Lungs with poor air movement. CV irregular and distant, no murmur. No peripheral edema.   Pt with chronic AF, not a candidate for anticoagulation as previously determined. She has severe COPD and exacerbation of COPD at present. Atrial fib will be rate-controlled. There  has been one low BP while here in the hospital. May be best to simplify her regimen and stop Toprol XL, increase Cardizem CD to 240 mg daily. Atrial fib will continue to be difficult to control in the setting of severe obstructive lung disease. Will follow with you. thx  Tonny Bollman, M.D. 03/19/2015 2:20 PM

## 2015-03-19 NOTE — Progress Notes (Signed)
Pt weaned from Venturi mask on admission to 3L [place]. Will continue to monitor.

## 2015-03-19 NOTE — Progress Notes (Signed)
PT Cancellation Note  Patient Details Name: [name] MRN: [id number] DOB: Aug 26, 1929   Cancelled Treatment:    Reason Eval/Treat Not Completed: Medical issues which prohibited therapy; patient with rising troponin level.  Will check back tomorrow.    WYNN,CYNDI 03/19/2015, 8:41 AM  Sheran Lawlessyndi Wynn, PT [phone] 03/19/2015

## 2015-03-20 DIAGNOSIS — I481 Persistent atrial fibrillation: Secondary | ICD-10-CM

## 2015-03-20 LAB — BASIC METABOLIC PANEL
Anion gap: 6 (ref 5–15)
BUN: 45 mg/dL — AB (ref 6–20)
CHLORIDE: 98 mmol/L — AB (ref 101–111)
CO2: 31 mmol/L (ref 22–32)
CREATININE: 1.53 mg/dL — AB (ref 0.44–1.00)
Calcium: 10.1 mg/dL (ref 8.9–10.3)
GFR calc Af Amer: 35 mL/min — ABNORMAL LOW (ref >60)
GFR calc non Af Amer: 30 mL/min — ABNORMAL LOW (ref >60)
Glucose, Bld: 167 mg/dL — ABNORMAL HIGH (ref 65–99)
Potassium: 4.7 mmol/L (ref 3.5–5.1)
SODIUM: 135 mmol/L (ref 135–145)

## 2015-03-20 LAB — CBC
HEMATOCRIT: 30.9 % — AB (ref 36.0–46.0)
HEMOGLOBIN: 9.4 g/dL — AB (ref 12.0–15.0)
MCH: 28 pg (ref 26.0–34.0)
MCHC: 30.4 g/dL (ref 30.0–36.0)
MCV: 92 fL (ref 78.0–100.0)
Platelets: 226 10*3/uL (ref 150–400)
RBC: 3.36 MIL/uL — ABNORMAL LOW (ref 3.87–5.11)
RDW: 14.1 % (ref 11.5–15.5)
WBC: 8.3 10*3/uL (ref 4.0–10.5)

## 2015-03-20 LAB — GLUCOSE, CAPILLARY
GLUCOSE-CAPILLARY: 224 mg/dL — AB (ref 65–99)
Glucose-Capillary: 131 mg/dL — ABNORMAL HIGH (ref 65–99)
Glucose-Capillary: 251 mg/dL — ABNORMAL HIGH (ref 65–99)
Glucose-Capillary: 271 mg/dL — ABNORMAL HIGH (ref 65–99)

## 2015-03-20 MED ORDER — PRO-STAT SUGAR FREE PO LIQD
30.0000 mL | Freq: Every day | ORAL | Status: DC
  Administered 2015-03-20 – 2015-03-27 (×6): 30 mL via ORAL
  Filled 2015-03-20 (×7): qty 30

## 2015-03-20 MED ORDER — LEVOFLOXACIN 750 MG PO TABS
750.0000 mg | ORAL_TABLET | ORAL | Status: DC
  Administered 2015-03-20 – 2015-03-22 (×2): 750 mg via ORAL
  Filled 2015-03-20 (×5): qty 1

## 2015-03-20 MED ORDER — ADULT MULTIVITAMIN W/MINERALS CH
1.0000 | ORAL_TABLET | Freq: Every day | ORAL | Status: DC
  Administered 2015-03-20 – 2015-03-27 (×8): 1 via ORAL
  Filled 2015-03-20 (×9): qty 1

## 2015-03-20 MED ORDER — ENSURE ENLIVE PO LIQD
237.0000 mL | ORAL | Status: DC
  Administered 2015-03-20 – 2015-03-25 (×5): 237 mL via ORAL

## 2015-03-20 MED ORDER — LEVOFLOXACIN 750 MG PO TABS: 750.0000 mg | ORAL_TABLET | Freq: Every day | ORAL | Status: DC

## 2015-03-20 NOTE — Progress Notes (Signed)
Patient Name: [name] Date of Encounter: 03/20/2015  Active Problems:   Atrial fibrillation (HCC)   COPD exacerbation (HCC)   HCAP (healthcare-associated pneumonia)   Acute respiratory failure with hypoxia [facility])   Primary Cardiologist: Dr. Myrtis Ser Now Dr. Delton See Patient Profile: 81 y.o. female w/ PMH of CAD (s/p CABG x5 in 2005, BMS to SVG-D2 in 2014), HTN, HLD, Type 2 DM COPD (on home O2), chronic diastolic CHF, carotid artery disease, and chronic atrial fibrillation (not on anticoagulation due to history of GI bleed) who presented to [facility] ED on 03/18/2015 with a COPD exacerbation. Cards consulted on 03/19/2015 for atrial fibrillation w/ RVR.  SUBJECTIVE: Having shortness of breath with activity (had just finished working with PT). PT reported HR was max in the 130's with activity. Denies any chest pain or palpitations.  OBJECTIVE Filed Vitals:   03/20/15 0552 03/20/15 0911 03/20/15 0938 03/20/15 0946  BP:    117/57  Pulse:    116  Temp:      TempSrc:      Resp:      Height:      Weight:      SpO2: 97% 93% 97% 98%    Intake/Output Summary (Last 24 hours) at 03/20/15 1059 Last data filed at 03/20/15 1610  Gross per 24 hour  Intake    720 ml  Output   2050 ml  Net  -1330 ml   Filed Weights   03/18/15 2113 03/19/15 0451 03/20/15 0457  Weight: 175 lb 4.8 oz (79.516 kg) 175 lb 14.8 oz (79.8 kg) 177 lb 11.2 oz (80.604 kg)    PHYSICAL EXAM General: Pleasant, elderly female in no acute distress. Head: Normocephalic, atraumatic.  Neck: Supple without bruits, JVD not elevated. Lungs:  Resp regular and unlabored, Decreased breath sounds, minimal rales at bases. Heart: Irregularly irregular, S1, S2, no S3, S4, or murmur; no rub. Abdomen: Soft, non-tender, non-distended with normoactive bowel sounds. No hepatomegaly. No rebound/guarding. No obvious abdominal masses. Extremities: No clubbing, cyanosis, or edema. Distal pedal pulses are 2+ bilaterally. Neuro:  Alert and oriented X 3. Moves all extremities spontaneously. Psych: Normal affect.   LABS: CBC: Recent Labs  03/19/15 0304 03/20/15 0348  WBC 9.2 8.3  HGB 9.3* 9.4*  HCT 31.3* 30.9*  MCV 92.9 92.0  PLT 170 226   INR:No results for input(s): INR in the last 72 hours. Basic Metabolic Panel: Recent Labs  03/19/15 0304 03/20/15 0348  NA 135 135  K 5.6* 4.7  CL 99* 98*  CO2 29 31  GLUCOSE 170* 167*  BUN 34* 45*  CREATININE 1.46* 1.53*  CALCIUM 9.7 10.1   Cardiac Enzymes: Recent Labs  03/18/15 2259 03/19/15 0304 03/19/15 1005  TROPONINI 0.04* 0.06* 0.04*    Recent Labs  03/18/15 1620  TROPIPOC 0.01   BNP:  B NATRIURETIC PEPTIDE  Date/Time Value Ref Range Status  03/18/2015 04:05 PM 153.9* 0.0 - 100.0 pg/mL Final  11/17/2014 10:23 PM 179.0* 0.0 - 100.0 pg/mL Final   TELE:  Atrial fibrillation with rate in 90's - low 100's. Peak in 120's.    ECHO: 10/2014 Study Conclusions - Left ventricle: The cavity size was normal. Wall thickness was increased in a pattern of mild LVH. Systolic function was normal. The estimated ejection fraction was in the range of 55% to 60%. Regional wall motion abnormalities cannot be excluded. - Mitral valve: There was mild regurgitation. - Left atrium: The atrium was moderately dilated. - Right atrium: The  atrium was mildly dilated. - Pulmonary arteries: PA peak pressure: 35 mm Hg (S).  Radiology/Studies: Dg Chest Portable 1 View: 03/18/2015  CLINICAL DATA:  81 year old female in respiratory distress. Clinical history of COPD. EXAM: PORTABLE CHEST 1 VIEW COMPARISON:  Prior chest x-ray 11/19/2014 FINDINGS: Stable cardiomegaly. Patient is status post median sternotomy with evidence of prior multivessel CABG. Similar appearance of the lungs with diffuse bilateral bronchitic change and interstitial prominence. Perhaps slightly increased patchy airspace opacity in both lung bases. No new focal airspace consolidation, pulmonary edema,  pleural effusion or pneumothorax. Stable mild pulmonary vascular congestion. IMPRESSION: 1. Perhaps slightly increased patchy nonspecific opacities in both lung bases which may represent atelectasis or infiltrate. 2. Otherwise, stable appearance of chronic bronchitic change, pulmonary vascular congestion and interstitial prominence compared to prior imaging. Electronically Signed   By: Malachy Moan M.D.   On: 03/18/2015 16:51   Current Medications:  . aspirin EC  81 mg Oral Daily  . atorvastatin  20 mg Oral q1800  . budesonide-formoterol  2 puff Inhalation BID  . diltiazem  240 mg Oral Daily  . enoxaparin (LOVENOX) injection  40 mg Subcutaneous Q24H  . feeding supplement  1 Container Oral TID BM  . furosemide  40 mg Oral Daily  . insulin aspart  0-9 Units Subcutaneous TID WC  . insulin glargine  10 Units Subcutaneous QHS  . levofloxacin  750 mg Oral QHS  . pantoprazole  40 mg Oral Daily  . predniSONE  50 mg Oral Q breakfast  . senna  1 tablet Oral BID  . sodium chloride  3 mL Intravenous Q12H  . tiotropium  1 capsule Inhalation Daily   . sodium chloride 10 mL/hr at 03/18/15 2245    ASSESSMENT AND PLAN:  1. Atrial Fibrillation with RVR - has known history of chronic atrial fibrillation. Has been in atrial fibrillation since admission with HR in 110's - 150's. Was in atrial fibrillation w/ HR at 111 during office visit in 11/2014. - This patients CHA2DS2-VASc Score and unadjusted Ischemic Stroke Rate (% per year) is equal to 11.2 % stroke rate/year from a score of 7. (CHF, HTN, DM, Vascular, Female, Age > 7 x2). Has not been considered a good candidate for anticoagulation due to history of GIB and AVM's. - was on Toprol-XL  daily and Cardizem CD  daily PTA. Cardizem CD increased to  daily on 03/19/2015 and Toprol-XL was discontinued. BP has been stable. HR is now better controlled and staying in 90's - low 100's. Peaked into 130's when walking with PT.  2. Elevated  Troponin - cyclic troponin values have been 0.04 and 0.06. - were similar to these values during her previous hospitalization in 10/2014.  - likely to represent demand ischemia in setting of acute COPD exacerbation and atrial fibrillation with RVR.  3. History of CAD - s/p CABG x5 in 2005, BMS to SVG-D2 in 2014 - continue ASA and statin.  4. Chronic Diastolic CHF - EF 55-60% in 10/2014. - continue home Lasix dosage  5. Hyperkalemia - potassium at 5.6 on 03/19/2015. Normalized to 4.7 on 03/20/2015. - home potassium supplementation has been discontinued at this time.  Lorri Frederick , PA-C 10:59 AM 03/20/2015 Pager: [phone]  Patient seen, examined. Available data reviewed. Agree with findings, assessment, and plan as outlined by Randall An, PA-C. Exam reveals elderly woman in NAD, poor air movement bilaterally, heart irregular and distant, no changes from yesterday's exam. Plan as outlined with increase of cardizem CD  to 240 mg daily. BP is stable. Pt not a candidate for anticoagulation. Not much else to add. Please call if any other cardiac issues arise.   Tonny BollmanMichael Azizi Bally, M.D. 03/20/2015 2:09 PM

## 2015-03-20 NOTE — Progress Notes (Signed)
Pt achieving 1500 on Incentive Spirometer. Education given. Will continue to monitor.

## 2015-03-20 NOTE — Progress Notes (Signed)
Physical Therapy Treatment Patient Details Name: [name] MRN: [id number] DOB: [birth year] Today's Date: 03/20/2015    History of Present Illness [name] is a 81 y.o. female presenting with dyspnea and cough. PMH is significant for COPD (on home oxygen, 2 L), A. fib with RVR, chronic diastolic CHF, s/p CABG, CAD, PAD, aortic valve sclerosis, mitral regurgitation, hypertension, hyperlipidemia, DM2, aortic aneurysm, and tobacco abuse.  Admitted with COPD exacerbation and a-fib w/ RVR.    PT Comments    Pt having a hard time with mobilizing today due to increased WOB with high EHR, but acceptable sats on 2-3 L oxygen  Follow Up Recommendations  SNF (Pt needs therapy and time to help her recover and inc safety)     Equipment Recommendations  None recommended by PT    Recommendations for Other Services       Precautions / Restrictions Precautions Precautions: Fall Precaution Comments: watch HR Restrictions Weight Bearing Restrictions: No    Mobility  Bed Mobility Overal bed mobility: Modified Independent (with the HOB raised like her bed at home)                Transfers Overall transfer level: Needs assistance Equipment used: Rolling walker (2 wheeled) Transfers: Sit to/from Stand Sit to Stand: Min guard         General transfer comment: guard due to severe dyspnea with any movements  Ambulation/Gait Ambulation/Gait assistance: Min guard Ambulation Distance (Feet): 8 Feet (then additional 18 feet after 5 min rest.) Assistive device: Rolling walker (2 wheeled) Gait Pattern/deviations: Step-through pattern Gait velocity: slow   General Gait Details: generally steady with RW, flexed posture and very dyspneic.  Initial sitting EOB after warm up exercise, sats 94/95%  HR  128 down to 84 bpm.  After 8' amb, sats 91/92% with EHR 130's down to 90's in approx 5 min.  After amb 18' sats 92/94% on 3L [place], EHR  135 down to 90's.  Pt appearing in  distress.   Stairs            Wheelchair Mobility    Modified Rankin (Stroke Patients Only)       Balance Overall balance assessment: Needs assistance Sitting-balance support: No upper extremity supported Sitting balance-Leahy Scale: Good     Standing balance support: No upper extremity supported Standing balance-Leahy Scale: Poor Standing balance comment: reliant on the RW                    Cognition Arousal/Alertness: Awake/alert Behavior During Therapy: WFL for tasks assessed/performed Overall Cognitive Status: Within Functional Limits for tasks assessed                      Exercises      General Comments General comments (skin integrity, edema, etc.): Sats generally in low to mid 90's and EHR 90-140;s      Pertinent Vitals/Pain Pain Assessment: No/denies pain    Home Living                      Prior Function            PT Goals (current goals can now be found in the care plan section) Acute Rehab PT Goals Patient Stated Goal: to be able to breath better PT Goal Formulation: With patient Time For Goal Achievement: 04/02/15 Potential to Achieve Goals: Good Progress towards PT goals: Progressing toward goals    Frequency  Min 3X/week  PT Plan Current plan remains appropriate    Co-evaluation             End of Session Equipment Utilized During Treatment: Oxygen Activity Tolerance: Patient limited by fatigue Patient left: in bed;with call bell/phone within reach     Time: 1029-1108 PT Time Calculation (min) (ACUTE ONLY): 39 min  Charges:  $Gait Training: 8-22 mins $Therapeutic Activity: 23-37 mins                    G Codes:      [name], Eliseo Gum 03/20/2015, 11:44 AM 03/20/2015  [place]ott Bing, PT [phone]  (pager)

## 2015-03-20 NOTE — Progress Notes (Signed)
Occupational Therapy Treatment Patient Details Name: [name] MRN: [id number] DOB: [birth year] Today's Date: 03/20/2015    History of present illness [name] is a 81 y.o. female presenting with dyspnea and cough. PMH is significant for COPD (on home oxygen, 2 L), A. fib with RVR, chronic diastolic CHF, s/p CABG, CAD, PAD, aortic valve sclerosis, mitral regurgitation, hypertension, hyperlipidemia, DM2, aortic aneurysm, and tobacco abuse.  Admitted with COPD exacerbation and a-fib w/ RVR.   OT comments  This 81 yo female admitted with above presents to acute OT with what it appears to be increased DOE than she had yesterday, thus she is not making progress towards goals today. Have changed recommendation to ST SNF so she can get back to a Mod I level to be home alone during part of day.  Follow Up Recommendations  SNF    Equipment Recommendations  None recommended by OT       Precautions / Restrictions Precautions Precautions: Fall Precaution Comments: HR (Afib) and sats good however pt really struggling today breathing with any movement Restrictions Weight Bearing Restrictions: No       Mobility Bed Mobility Overal bed mobility: Needs Assistance Bed Mobility: Sit to Supine       Sit to supine: Min assist (LEs)      Transfers Overall transfer level: Needs assistance Equipment used: Rolling walker (2 wheeled) Transfers: Sit to/from Stand Sit to Stand: Min guard         General transfer comment: guard due to severe dyspnea with any movements    Balance Overall balance assessment: Needs assistance Sitting-balance support: No upper extremity supported;Feet supported Sitting balance-Leahy Scale: Good     Standing balance support: No upper extremity supported Standing balance-Leahy Scale: Poor Standing balance comment: has to rely on RW                   ADL Overall ADL's : Needs assistance/impaired             Lower Body Bathing: Min  guard;Sit to/from stand           Toilet Transfer: Min guard;Ambulation;RW (BSC on opposite side of bed she was on)   Toileting- ArchitectClothing Manipulation and Hygiene: Min guard;Sit to/from stand         General ADL Comments: Had to cue pt for purse lipped breathing when she got to 4/4 DOE and had to sit down. I also gave her our energy conservation handout and went over strategies from it that would be of benefit to her.                Cognition   Behavior During Therapy: Anxious (due to DOE) Overall Cognitive Status: Within Functional Limits for tasks assessed                                    Pertinent Vitals/ Pain       Pain Assessment: No/denies pain         Frequency Min 2X/week     Progress Toward Goals  OT Goals(current goals can now be found in the care plan section)  Progress towards OT goals: Not progressing toward goals - comment (due to increased DOE today)  Acute Rehab OT Goals Patient Stated Goal: to be able to breath better  Plan Discharge plan needs to be updated       End of Session Equipment Utilized During  Treatment: Rolling walker;Oxygen (3 liters)   Activity Tolerance Patient limited by fatigue (limited by DOE)   Patient Left in bed;with call bell/phone within reach;with bed alarm set   Nurse Communication  (Pt requesting inhaler due to severe DOE)        Time: 4098-1191 OT Time Calculation (min): 22 min  Charges: OT General Charges $OT Visit: 1 Procedure OT Treatments $Self Care/Home Management : 8-22 mins  Evette Georges [phone] 03/20/2015, 12:43 PM

## 2015-03-20 NOTE — Progress Notes (Signed)
Pt continues to get short of breath with minimal exertion to the bedside commode. Will continue to monitor.

## 2015-03-20 NOTE — Progress Notes (Signed)
Family Medicine Teaching Service Daily Progress Note Intern Pager: [phone]  Patient name: [name] Medical record number: [id number] Date of birth: 1929/07/30 Age: 81 y.o. Gender: female  Primary Care Provider: Kaleen Mask, MD Consultants: none Code Status: FULL  Pt Overview and Major Events to Date:  11/21: Admitted for dyspnea likely due to COPD exacerbation 11/22: Increased Cardizem dose; discontinued Toprol XL per cards  Assessment and Plan: [name] is a 81 y.o. female presenting with dyspnea and cough. PMH is significant for COPD (on home oxygen, 2 L), A. fib with RVR (not on anticoad 2/2 hx GI bleed 2/2 AVMs), chronic diastolic CHF, s/p CABG, CAD, PAD, aortic valve sclerosis, mitral regurgitation, hypertension, hyperlipidemia, DM2, aortic aneurysm, and tobacco abuse.  A. fib with RVR: Patient in atrial fibrillation and with HR 64-105. Blood pressure stable in 119-148/61-63. Patient is not currently on anticoagulation due to previous GI bleed due to AVM (CHADS2 4). Patient is followed by Dr. Rejeana Brock at [facility]. - On telemetry - 11/22 AM EKG: AFib with increased HR, LAD; no significant change from prior - Cardiology consulted, appreciate recs:  - Cardizem to  daily (increased from home dose) - Discontinued Metoprolol (per cards)   Dyspnea, likely secondary to COPD exacerbation: Gold D COPD; On 2 L of oxygen at home. Required Venturi mask in ED. ED initially treated for HCAP, providing 1 dose of vancomycin and Fortaz. Unlikely infectious process as currently afebrile., leukocytosis on admit but now wnl (9.2), and, no specific region of consolidation on chest x-ray. On 2 L [place] with good saturations, no wheezing on exam, slightly decreased breath sounds near the bases but crackles at R lung base. Not concerning for ACS with no acute changes in EKG and unremarkable troponins. Considered CHF however, BNP is only mildly elevated, CXR not concerning  for pulmonary edema, and no weight gain compared to outpatient weights. Possibly contributed by [employer] with RVR.  - CXR on admit: flat diaphragms, hyperinflation; slightly increased patchy nonspecific opacities in both lung bases- atelectasis vs infiltrate; ABG: pH 7.39, CO2 43.9, Bicarb 26.9 - Levaquin per pharmacy - Prednisone 50 mg daily - Continue home Spiriva, Symbicort, albuterol nebulizer PRN. - Supplemental O2 to keep saturations above 92% (will contact nursing to ensure saturations are titrated down as to not persist at 100%.) - ED obtained blood and urine cultures: NG < 24 hrs, urine cx pending - PT consulted: HHPT; No OT needs at this time - incentive spirometry   Mild Hyperkalemia: 4.7 (from 4.8 on admit) - will monitor - held home Kdur    Mild AKI: (baseline Cr 1.1-1.2) - Cr 1.46 > 1.53 (stable from admission)  - will continue to monitor - will hold lasix  Asymptomatic Bacteruria:  - UA: trace LE, Pos Nitrites, many bacteria, WBC 6-30 - urine culture from ED - treatment not indicated   Normocytic Anemia: Hgb on admit 10.9.> 9.3 >9.4 (Baseline ~9) - hgb stable  Chronic diastolic CHF/CABG (2005)/CAD/PAD/aortic valve sclerosis/mitral regurgitation/hypertension: CHF last EF of 55%- 60% - Continue Lasix 40 mg daily, aspirin 81 mg daily - ASA - Lipitor - Home nitroglycerin tablet when necessary for chest pain  Type 2 diabetes: Patient on injectable insulin at home. CBGs stable, 131-174 - Lantus 10 units daily (home dose 15 units daily) - Sliding-scale insulin sensitive  Hyperlipidemia - Continue Lipitor 20 mg daily  FEN/GI: IV fluids KVO, heart healthy/carb modified diet; Protonix PO Prophylaxis: Lovenox  Disposition: Admit to teaching service for management of  COPD exacerbation   Subjective:  - states breathing gets worse with ambulation or transfer   - no palpitations  - no fevers or chills, no abdominal pain   - denies dysuria, increased frequency of urination     Objective: Temp:  [97.6 F (36.4 C)-97.8 F (36.6 C)] 97.6 F (36.4 C) (11/23 0457) Pulse Rate:  [64-150] 64 (11/23 0457) Resp:  [18-20] 20 (11/23 0457) BP: (114-148)/(61-90) 148/63 mmHg (11/23 0457) SpO2:  [91 %-99 %] 97 % (11/23 0552) Weight:  [177 lb 11.2 oz (80.604 kg)] 177 lb 11.2 oz (80.604 kg) (11/23 0457) Physical Exam: General -- NAD Chest --  Improved breath sounds compared to yesterday; mild crackles noted mainly in the R base (improved from yesterday)  Cardiac -- irregularly irregular . No murmur appreciated Abdomen -- soft, nontender. No masses palpable. Bowel sounds present.. Extremeties - no tenderness or effusions noted.  Laboratory:  Recent Labs Lab 03/18/15 1605 03/19/15 0304 03/20/15 0348  WBC 12.3* 9.2 8.3  HGB 10.9* 9.3* 9.4*  HCT 35.7* 31.3* 30.9*  PLT 199 170 226    Recent Labs Lab 03/18/15 1605 03/19/15 0304  NA 138 135  K 4.8 5.6*  CL 98* 99*  CO2 30 29  BUN 32* 34*  CREATININE 1.43* 1.46*  CALCIUM 10.0 9.7  GLUCOSE 110* 170*    Palma HolterKanishka G Jalayiah Bibian, MD 03/20/2015, 6:25 AM PGY-1, [place] [facility] FPTS Intern pager: [phone], text pages welcome

## 2015-03-20 NOTE — Progress Notes (Signed)
Initial Nutrition Assessment   INTERVENTION:  Provide Ensure Enlive po once daily, each supplement provides 350 kcal and 20 grams of protein Provide Pro-stat once daily, provides 15 grams of protein and 100 kcal Provide Multivitamin with minerals daily   NUTRITION DIAGNOSIS:   Predicted suboptimal nutrient intake related to acute illness as evidenced by moderate depletions of muscle mass (varied meal completion (ranging 25% to 100%)).   GOAL:   Patient will meet greater than or equal to 90% of their needs   MONITOR:   PO intake, Supplement acceptance, Labs, Weight trends, Skin, I & O's  REASON FOR ASSESSMENT:   Consult Assessment of nutrition requirement/status  ASSESSMENT:   81 y.o. female presenting with dyspnea and cough. PMH is significant for COPD (on home oxygen, 2 L), A. fib with RVR (not on anticoad 2/2 hx GI bleed 2/2 AVMs), chronic diastolic CHF, s/p CABG, CAD, PAD, aortic valve sclerosis, mitral regurgitation, hypertension, hyperlipidemia, DM2, aortic aneurysm, and tobacco abuse.  Pt states that her appetite has been good and she has been eating well. She reports eating 3 meals daily PTA with protein-rich foods and vegetables at most meals, but low intake of fruit. She reports limiting her sugar and salt intake. She denies any unintended weight loss and reports maintaining her weight in the high 170's to 184 lbs. Per nutrition-focused physical exam, she has some moderate muscle and fast wasting and her skin is very dry and flakey. RD encouraged a general healthful diet that is low in sodium. Pt states she has been eating most of her meals. Per nursing notes, meal completion has ranged 25% to 100%. Pt has been drinking Boost Breeze, but glucose is elevated at times.   Labs: low hemoglobin, low chloride  Diet Order:  Diet heart healthy/carb modified Room service appropriate?: Yes; Fluid consistency:: Thin  Skin:  Reviewed, no issues  Last BM:  11/20  Height:   Ht  Readings from Last 1 Encounters:  03/18/15 6' (1.829 m)    Weight:   Wt Readings from Last 1 Encounters:  03/20/15 177 lb 11.2 oz (80.604 kg)    Ideal Body Weight:  72.7 kg  BMI:  Body mass index is 24.1 kg/(m^2).  Estimated Nutritional Needs:   Kcal:  1900-2200  Protein:  110-120 grams  Fluid:  1.9-2.1 L/day  EDUCATION NEEDS:   No education needs identified at this time  Dorothea Ogleeanne Jacere Pangborn RD, LDN Inpatient Clinical Dietitian Pager: [phone] After Hours Pager: [phone]

## 2015-03-21 LAB — URINE CULTURE: Culture: >=100000

## 2015-03-21 LAB — GLUCOSE, CAPILLARY
GLUCOSE-CAPILLARY: 195 mg/dL — AB (ref 65–99)
GLUCOSE-CAPILLARY: 262 mg/dL — AB (ref 65–99)
GLUCOSE-CAPILLARY: 219 mg/dL — AB (ref 65–99)
Glucose-Capillary: 135 mg/dL — ABNORMAL HIGH (ref 65–99)

## 2015-03-21 MED ORDER — PREDNISONE 20 MG PO TABS
30.0000 mg | ORAL_TABLET | Freq: Once | ORAL | Status: AC
  Administered 2015-03-21: 30 mg via ORAL
  Filled 2015-03-21: qty 1

## 2015-03-21 MED ORDER — CEPHALEXIN 250 MG PO CAPS: 250.0000 mg | ORAL_CAPSULE | Freq: Four times a day (QID) | ORAL | Status: DC

## 2015-03-21 NOTE — Progress Notes (Signed)
Family Medicine Teaching Service Daily Progress Note Intern Pager: [phone]  Patient name: Karen Lynntta S Dattilio Medical record number: [id number] Date of birth: 13-Apr-1930 Age: 81 y.o. Gender: female  Primary Care Provider: [name],[name], MD Consultants: none Code Status: FULL  Pt Overview and Major Events to Date:  11/21: Admitted for dyspnea likely due to COPD exacerbation 11/22: Increased Cardizem dose; discontinued Toprol XL per cards 11/24: HR controlled at rest  Assessment and Plan: [name] is a 81 y.o. female presenting with dyspnea and cough. PMH is significant for COPD (on home oxygen, 2 L), A. fib with RVR (not on anticoad 2/2 hx GI bleed 2/2 AVMs), chronic diastolic CHF, s/p CABG, CAD, PAD, aortic valve sclerosis, mitral regurgitation, hypertension, hyperlipidemia, DM2, aortic aneurysm, and tobacco abuse.  A. fib with RVR, RVR resolved: Patient in atrial fibrillation, rate controlled on below medicines. Blood pressure stable/at goal. Patient is not currently on anticoagulation due to previous GI bleed due to AVM (CHADS2 4). Patient is followed by Dr. Rejeana BrockKatz/Nelson at [facility]. - On telemetry - Cardiology consulted, appreciate recs:  - Cardizem to 240mg  daily (increased from home dose) - Discontinued Metoprolol (per cards) last dose 11/22   Dyspnea, likely secondary to COPD exacerbation: Gold D COPD; On 2 L of oxygen at home. Required Venturi mask in ED. ED initially treated for HCAP, providing 1 dose of vancomycin and Fortaz. Unlikely infectious process as currently afebrile, leukocytosis on admit but resolved quickly, and no specific region of consolidation on chest x-ray.  Not concerning for ACS with no acute changes in EKG and unremarkable troponins. Considered CHF however, BNP is only mildly elevated, CXR not concerning for pulmonary edema, and no weight gain compared to outpatient weights. Possibly contributed by Afib with RVR. CXR on admit: flat  diaphragms, hyperinflation; slightly increased patchy nonspecific opacities in both lung bases- atelectasis vs infiltrate; ABG: pH 7.39, CO2 43.9, Bicarb 26.9 - Levaquin per pharmacy (11/21>>) - Prednisone 50 mg daily, additional 30mg  11/24 for total daily 80mg  x 1 day - Continue home Spiriva, Symbicort, albuterol nebulizer PRN. - ED obtained blood and urine cultures: NGTD, urine cx pending - PT consulted: HHPT; No OT needs at this time - incentive spirometry   E. Coli positive urine culture: urine cx 11/21 >100k e.coli pansensitive. - UA: trace LE, Pos Nitrites, many bacteria, WBC 6-30 - should be covered by levaquin already  Mild Hyperkalemia: went up to 5.6 (no report on whether hemolysis present), now resolved - will monitor - held home Kdur    Mild AKI: (baseline Cr 1.1-1.2) - Cr 1.46 > 1.53 on 11/23 (stable from admission)  - will continue to monitor - will hold lasix  Normocytic Anemia: Hgb on admit 10.9.> 9.3 >9.4 on 11/23 (Baseline ~9) - continue to monitor  Chronic diastolic CHF/CABG (2005)/CAD/PAD/aortic valve sclerosis/mitral regurgitation/hypertension: CHF last EF of 55%- 60% - Continue Lasix 40 mg daily, aspirin 81 mg daily - ASA - Lipitor - Home nitroglycerin tablet when necessary for chest pain  Type 2 diabetes: Patient on injectable insulin at home. CBGs stable, 135-271 - Lantus 10 units daily (home dose 15 units daily) - Sliding-scale insulin sensitive  Hyperlipidemia - Continue Lipitor 20 mg daily  FEN/GI: IV fluids KVO, heart healthy/carb modified diet; Protonix PO Prophylaxis: Lovenox  Disposition: pending clinical improvement  Subjective:  Feels weak today. Thinks breathing is about the same. Has a non-productive cough still.  Objective: Temp:  [97.6 F (36.4 C)-97.8 F (36.6 C)] 97.8 F (36.6 C) (  11/24 0432) Pulse Rate:  [60-116] 60 (11/24 0432) Resp:  [18-20] 18 (11/24 0432) BP: (117-140)/(57-78) 140/78 mmHg (11/24 0432) SpO2:  [93 %-98 %]  97 % (11/24 0432) Weight:  [180 lb (81.647 kg)] 180 lb (81.647 kg) (11/24 0432) Physical Exam: General -- NAD Chest --  Improved breath sounds compared to yesterday; mild crackles noted mainly in the R base and faint wheeze noted on right with cough  Cardiac -- irregularly irregular, normal rate . No murmur appreciated Abdomen -- soft, nontender. No masses palpable. Bowel sounds present.. Extremities - no tenderness or effusions noted. There is trace edema LE.  Laboratory:  Recent Labs Lab 03/18/15 1605 03/19/15 0304 03/20/15 0348  WBC 12.3* 9.2 8.3  HGB 10.9* 9.3* 9.4*  HCT 35.7* 31.3* 30.9*  PLT 199 170 226    Recent Labs Lab 03/18/15 1605 03/19/15 0304 03/20/15 0348  NA 138 135 135  K 4.8 5.6* 4.7  CL 98* 99* 98*  CO2 BUN 32* 34* 45*  CREATININE 1.43* 1.46* 1.53*  CALCIUM 10.0 9.7 10.1  GLUCOSE 110* 170* 167*    Nani Ravens, MD 03/21/2015, 8:48 AM PGY-3, [place] [facility] FPTS Intern pager: [phone], text pages welcome

## 2015-03-21 NOTE — Progress Notes (Signed)
Pt struggled for breathing at times, noticed dyspnea during rest and much more exertion especially getting out of bed to bed side commode, throughout the time encouraged to use spirometer and flutter valve, getting oxygen via nasal canula at 3l/min, getting Respiratory treatment, tolerating diets and meds, will continue to monitor the patient.

## 2015-03-22 LAB — GLUCOSE, CAPILLARY
Glucose-Capillary: 155 mg/dL — ABNORMAL HIGH (ref 65–99)
Glucose-Capillary: 135 mg/dL — ABNORMAL HIGH (ref 65–99)
Glucose-Capillary: 216 mg/dL — ABNORMAL HIGH (ref 65–99)

## 2015-03-22 LAB — BASIC METABOLIC PANEL
Anion gap: 6 (ref 5–15)
BUN: 34 mg/dL — ABNORMAL HIGH (ref 6–20)
CHLORIDE: 98 mmol/L — AB (ref 101–111)
CO2: 33 mmol/L — AB (ref 22–32)
CREATININE: 1.09 mg/dL — AB (ref 0.44–1.00)
Calcium: 10.3 mg/dL (ref 8.9–10.3)
GFR calc non Af Amer: 46 mL/min — ABNORMAL LOW (ref >60)
GFR, EST AFRICAN AMERICAN: 53 mL/min — AB (ref >60)
GLUCOSE: 152 mg/dL — AB (ref 65–99)
Potassium: 3.8 mmol/L (ref 3.5–5.1)
Sodium: 137 mmol/L (ref 135–145)

## 2015-03-22 MED ORDER — FUROSEMIDE 40 MG PO TABS
40.0000 mg | ORAL_TABLET | Freq: Every day | ORAL | Status: DC
  Administered 2015-03-22: 40 mg via ORAL
  Filled 2015-03-22: qty 1

## 2015-03-22 NOTE — Progress Notes (Signed)
Talked to patient about DCP; patient is agreeable to go to SNF at discharge and request [facility] nursing facility. Brayton CavesJessie Soc Worker made aware. Abelino DerrickB Tamim Skog [facility] MarionRN,MHA,BSN [phone]

## 2015-03-22 NOTE — Progress Notes (Signed)
Physical Therapy Treatment Patient Details Name: [name] MRN: [id number] DOB: Jan 19, 1930 Today's Date: 03/22/2015    History of Present Illness [name] is a 81 y.o. female presenting with dyspnea and cough. PMH is significant for COPD (on home oxygen, 2 L), A. fib with RVR, chronic diastolic CHF, s/p CABG, CAD, PAD, aortic valve sclerosis, mitral regurgitation, hypertension, hyperlipidemia, DM2, aortic aneurysm, and tobacco abuse.  Admitted with COPD exacerbation and a-fib w/ RVR.    PT Comments    Pt appears distressed even when numbers are adequate every time she ambulates.  She will likely only make progress if she can get some therapy most days before then home with HHPT.  Follow Up Recommendations  SNF (family may refuse and take her home.)     Equipment Recommendations  None recommended by PT    Recommendations for Other Services       Precautions / Restrictions Precautions Precautions: Fall Precaution Comments: HR (Afib) and sats good however pt really struggling today breathing with any movement Restrictions Weight Bearing Restrictions: No    Mobility  Bed Mobility Overal bed mobility: Needs Assistance Bed Mobility: Supine to Sit     Supine to sit: Modified independent (Device/Increase time)        Transfers Overall transfer level: Needs assistance Equipment used: Rolling walker (2 wheeled) Transfers: Sit to/from Stand Sit to Stand: Min guard         General transfer comment: guard due to severe dyspnea with any movements  Ambulation/Gait Ambulation/Gait assistance: Min guard;Min assist Ambulation Distance (Feet): 25 Feet (x2 with 4-5 min rest in between, 12 feet around bed to chair) Assistive device: Rolling walker (2 wheeled) Gait Pattern/deviations: Step-through pattern Gait velocity: slow Gait velocity interpretation: Below normal speed for age/gender General Gait Details: generally steady, but guarded and slow.   Stairs             Wheelchair Mobility    Modified Rankin (Stroke Patients Only)       Balance Overall balance assessment: Needs assistance Sitting-balance support: No upper extremity supported Sitting balance-Leahy Scale: Good       Standing balance-Leahy Scale: Fair                      Cognition Arousal/Alertness: Awake/alert Behavior During Therapy: Anxious Overall Cognitive Status: Within Functional Limits for tasks assessed                      Exercises      General Comments General comments (skin integrity, edema, etc.): Sats at rest on 3L  at 96% ahd HR 114 bpm.  After amb 25' sats 91% and EHR up to 152 bpm, after 1.5 min 94% 92 bpm.  On 4 L [place] amb w5'  sats 93% and EHR 128 bpm, after 1.4 min sats 96% and 89 bpm.  amb 15'  safts 95% and 141 bpm wiht 95% and 120's down to 100 in 1.5 min.  No matter what the numbers, pt appears in distress and likely somewhat anxiety.      Pertinent Vitals/Pain Pain Assessment: No/denies pain    Home Living                      Prior Function            PT Goals (current goals can now be found in the care plan section) Acute Rehab PT Goals Patient Stated Goal: to be able  to breath better PT Goal Formulation: With patient Time For Goal Achievement: 04/02/15 Potential to Achieve Goals: Good Progress towards PT goals: Progressing toward goals    Frequency  Min 3X/week    PT Plan Current plan remains appropriate    Co-evaluation             End of Session Equipment Utilized During Treatment: Oxygen Activity Tolerance: Patient limited by fatigue Patient left: with call bell/phone within reach;in chair;with family/visitor present     Time: 4098-1191 PT Time Calculation (min) (ACUTE ONLY): 34 min  Charges:  $Gait Training: 8-22 mins $Therapeutic Activity: 8-22 mins                    G Codes:      Draya Felker, Eliseo Gum 03/22/2015, 10:26 AM 03/22/2015  [place] Bing,  PT [phone]  (pager)

## 2015-03-22 NOTE — NC FL2 (Signed)
[place] MEDICAID FL2 LEVEL OF CARE SCREENING TOOL     IDENTIFICATION  Patient Name: [name] Birthdate: July 03, 1929 Sex: female Admission Date (Current Location): 03/18/2015  [facility] and [place] Number: Producer, television/film/video and Address:  [facility], [address] [place], [place] [address]      Provider Number: [id number]  Attending Physician Name and Address:  Tobey Grim, MD  Relative Name and Phone Number:       Current Level of Care: Hospital Recommended Level of Care: Skilled Nursing Facility Prior Approval Number:    Date Approved/Denied:   PASRR Number:    Discharge Plan: SNF    Current Diagnoses: Patient Active Problem List   Diagnosis Date Noted  . Acute respiratory failure with hypoxia (HCC)   . HCAP (healthcare-associated pneumonia) 03/18/2015  . Bile duct obstruction   . Elevated LFTs   . Elevated troponin 11/19/2014  . SOB (shortness of breath)   . Transaminitis 11/18/2014  . Pancreatic lesion 11/18/2014  . Sepsis (HCC) 05/04/2014  . UTI (lower urinary tract infection) 05/04/2014  . Atrial fibrillation with RVR (HCC) 05/04/2014  . Atrial fibrillation with rapid ventricular response (HCC)   . Pyelonephritis   . Bronchitis, chronic obstructive w acute bronchitis (HCC) 02/02/2014  . COPD exacerbation (HCC) 02/01/2014  . Dyspnea 11/15/2013  . Atrial fibrillation with tachycardic ventricular rate (HCC) 11/15/2013  . Peripheral arterial disease (HCC) 10/17/2013  . Atypical chest pain 02/08/2013  . Cellulitis, leg 09/28/2012  . Leg ulcer (HCC)   . CAD (coronary artery disease)   . Hematoma   . Mitral regurgitation   . Chronic diastolic CHF (congestive heart failure) (HCC) 07/20/2012  . Anemia 07/20/2012  . Intermediate coronary syndrome (HCC) 07/18/2012  . Acute exacerbation of chronic obstructive pulmonary disease (COPD) (HCC) 02/15/2012  . Dehydration 02/12/2012  . Alkalosis, metabolic 02/12/2012  .  Generalized weakness 02/11/2012  . Atrial fibrillation (HCC)   . Chronic respiratory failure (HCC) 07/29/2011  . H/O steroid therapy 07/28/2011  . Edema   . Aortic valve sclerosis   . Hypertension   . Hyperlipidemia   . GI bleed   . Aneurysm, aortic (HCC)   . Syncope   . Hyponatremia   . COPD (chronic obstructive pulmonary disease) (HCC)   . Tobacco abuse   . Bradycardia   . Hx of CABG   . Ejection fraction   . Carotid artery disease (HCC)   . Tuberculosis   . OXYGEN-USE OF SUPPLEMENTAL 08/02/2009    Orientation ACTIVITIES/SOCIAL BLADDER RESPIRATION    Self    Continent O2 (As needed) (2L)  BEHAVIORAL SYMPTOMS/MOOD NEUROLOGICAL BOWEL NUTRITION STATUS      Continent Diet (Heart Healthy and Carb Modified)  PHYSICIAN VISITS COMMUNICATION OF NEEDS Height & Weight Skin    Verbally 6' (182.9 cm) 181 lbs. Normal          AMBULATORY STATUS RESPIRATION    Assist extensive O2 (As needed) (2L)      Personal Care Assistance Level of Assistance  Bathing, Feeding, Dressing Bathing Assistance: Limited assistance Feeding assistance: Independent Dressing Assistance: Limited assistance      Functional Limitations Info  Sight, Hearing, Speech Sight Info: Adequate Hearing Info: Adequate Speech Info: Adequate       SPECIAL CARE FACTORS FREQUENCY  PT (By licensed PT), OT (By licensed OT)     PT Frequency: 3 OT Frequency: 3           Additional Factors Info  Code Status, Allergies Code Status Info: Full Allergies Info: Other, Codeine, Sulfonamide Derivatives, Warfarin, Penicillins, Ranitidine Hcl           Current Medications (03/22/2015): Current Facility-Administered Medications  Medication Dose Route Frequency Provider Last Rate Last Dose  . 0.9 %  sodium chloride infusion   Intravenous Continuous Kathee Delton, MD 10 mL/hr at 03/18/15 2245    . acetaminophen (TYLENOL) tablet 650 mg  650 mg Oral Q6H PRN Kathee Delton, MD   650 mg at 03/20/15 9604   Or  .  acetaminophen (TYLENOL) suppository 650 mg  650 mg Rectal Q6H PRN Kathee Delton, MD      . albuterol (PROVENTIL) (2.5 MG/3ML) 0.083% nebulizer solution 2.5 mg  2.5 mg Nebulization Q6H PRN Kathee Delton, MD   2.5 mg at 03/20/15 5409  . aspirin EC tablet 81 mg  81 mg Oral Daily Kathee Delton, MD   81 mg at 03/22/15 0920  . atorvastatin (LIPITOR) tablet 20 mg  20 mg Oral q1800 Kathee Delton, MD   20 mg at 03/21/15 1725  . budesonide-formoterol (SYMBICORT) 160-4.5 MCG/ACT inhaler 2 puff  2 puff Inhalation BID Kathee Delton, MD   2 puff at 03/22/15 [phone]  . diltiazem (CARDIZEM CD) 24 hr capsule 240 mg  240 mg Oral Daily Ellsworth Lennox, [place]   240 mg at 03/22/15 0920  . enoxaparin (LOVENOX) injection 40 mg  40 mg Subcutaneous Q24H Kathee Delton, MD   40 mg at 03/21/15 2202  . feeding supplement (ENSURE ENLIVE) (ENSURE ENLIVE) liquid 237 mL  237 mL Oral Q24H Reanne J Barbato, RD   237 mL at 03/21/15 1959  . feeding supplement (PRO-STAT SUGAR FREE 64) liquid 30 mL  30 mL Oral Daily Reanne J Barbato, RD   30 mL at 03/22/15 0920  . furosemide (LASIX) tablet 40 mg  40 mg Oral Daily Palma Holter, MD   40 mg at 03/22/15 1252  . insulin aspart (novoLOG) injection 0-9 Units  0-9 Units Subcutaneous TID WC Kathee Delton, MD   1 Units at 03/22/15 1251  . insulin glargine (LANTUS) injection 10 Units  10 Units Subcutaneous QHS Kathee Delton, MD   10 Units at 03/21/15 2202  . levofloxacin (LEVAQUIN) tablet 750 mg  750 mg Oral Q48H Tobey Grim, MD   750 mg at 03/20/15 2119  . multivitamin with minerals tablet 1 tablet  1 tablet Oral Daily [place] Senate, RD   1 tablet at 03/22/15 0920  . nitroGLYCERIN (NITROSTAT) SL tablet 0.4 mg  0.4 mg Sublingual Q5 min PRN Kathee Delton, MD      . pantoprazole (PROTONIX) EC tablet 40 mg  40 mg Oral Daily Kathee Delton, MD   40 mg at 03/22/15 0920  . polyethylene glycol (MIRALAX / GLYCOLAX) packet 17 g  17 g Oral Daily PRN Kathee Delton, MD      . predniSONE (DELTASONE) tablet 50  mg  50 mg Oral Q breakfast Tobey Grim, MD   50 mg at 03/22/15 0919  . senna (SENOKOT) tablet 8.6 mg  1 tablet Oral BID Kathee Delton, MD   8.6 mg at 03/22/15 0920  . sodium chloride 0.9 % injection 3 mL  3 mL Intravenous Q12H Kathee Delton, MD   3 mL at 03/22/15 1000  . tiotropium (SPIRIVA) inhalation capsule 18 mcg  1 capsule Inhalation Daily Kathee Delton, MD   [phone]  mcg at 03/22/15 [id number]   Do not use this list as official medication orders. Please verify with discharge summary.  Discharge Medications:   Medication List    ASK your doctor about these medications        albuterol 108 (90 BASE) MCG/ACT inhaler  Commonly known as:  PROVENTIL HFA;VENTOLIN HFA  Inhale 2 puffs into the lungs every 4 (four) hours as needed for wheezing or shortness of breath.     albuterol (2.5 MG/3ML) 0.083% nebulizer solution  Commonly known as:  PROVENTIL  Take 2.5 mg by nebulization every 6 (six) hours as needed for wheezing.     ALPRAZolam 0.25 MG tablet  Commonly known as:  XANAX  Take 1 tablet (0.25 mg total) by mouth at bedtime as needed for anxiety.     aspirin EC 81 MG tablet  Take 81 mg by mouth daily.     atorvastatin 40 MG tablet  Commonly known as:  LIPITOR  Take 0.5 tablets (20 mg total) by mouth daily at 6 PM.     diltiazem 120 MG 24 hr capsule  Commonly known as:  CARDIZEM CD  Take 1 capsule (120 mg total) by mouth daily.     furosemide 40 MG tablet  Commonly known as:  LASIX  Take 1 tablet (40 mg total) by mouth daily.     insulin aspart 100 UNIT/ML injection  Commonly known as:  NOVOLOG  Before each meal 3 times a day, 140-199 - 2 units, 200-250 - 4 units, 251-299 - 6 units,  300-349 - 8 units,  350 or above 10 units. Dispense syringes and needles as needed, Ok to switch to PEN if approved. Substitute to any brand approved. DX DM2, Code E11.65     insulin glargine 100 UNIT/ML injection  Commonly known as:  LANTUS  Inject 0.15 mLs (15 Units total) into the skin daily.      magnesium oxide 400 MG tablet  Commonly known as:  MAG-OX  Take 400 mg by mouth daily.     metoprolol succinate 25 MG 24 hr tablet  Commonly known as:  TOPROL-XL  Take 1 tablet (25 mg total) by mouth daily.     nitroGLYCERIN 0.4 MG SL tablet  Commonly known as:  NITROSTAT  Place 0.4 mg under the tongue every 5 (five) minutes as needed for chest pain.     ondansetron 8 MG tablet  Commonly known as:  ZOFRAN  Take 1 tablet (8 mg total) by mouth every 8 (eight) hours as needed for nausea or vomiting.     OXYGEN  Inhale into the lungs. 2L continously     pantoprazole 40 MG tablet  Commonly known as:  PROTONIX  Take 1 tablet (40 mg total) by mouth daily.     polyethylene glycol packet  Commonly known as:  MIRALAX / GLYCOLAX  Take 17 g by mouth daily as needed for mild constipation or moderate constipation.     potassium chloride SA 20 MEQ tablet  Commonly known as:  K-DUR,KLOR-CON  Take 20 mEq by mouth 2 (two) times daily.     predniSONE 5 MG tablet  Commonly known as:  DELTASONE  Take 2 tablets daily through September 2016; then On October 1st, decrease to 1 tablet daily     senna 8.6 MG Tabs tablet  Commonly known as:  SENOKOT  Take 8.6 mg by mouth daily as needed for mild constipation or moderate constipation.     SYMBICORT 160-4.5 MCG/ACT inhaler  Generic  drug:  budesonide-formoterol  USE 2 INHALATIONS TWICE A DAY     Tiotropium Bromide Monohydrate 2.5 MCG/ACT Aers  Commonly known as:  SPIRIVA RESPIMAT  Inhale 2 puffs into the lungs daily.     SPIRIVA HANDIHALER 18 MCG inhalation capsule  Generic drug:  tiotropium  INHALE THE CONTENTS OF 1 CAPSULE DAILY        Relevant Imaging Results:  Relevant Lab Results:  Recent Labs    Additional Information    Macario Golds, LCSW [phone]

## 2015-03-22 NOTE — Discharge Summary (Signed)
Family Medicine Teaching [facility] Discharge Summary  Patient name: [name] Medical record number: [id number] Date of birth: 1929-11-12 Age: 81 y.o. Gender: female Date of Admission: 03/18/2015  Date of Discharge: 03/26/15 Admitting Physician: Tobey Grim, MD  Primary Care Provider: Kaleen Mask, MD Consultants: none  Indication for Hospitalization:  Dyspnea secondary to COPD exacerbation and Afib with RVR  Discharge Diagnoses/Problem List:  COPD Exacerbation  Afib with RVR AKI  E Coli positive Urine Culture (treated)  T2DM Chronic Diastolic CHF CAD: CABG 2005, PAD, aortic valve stenosis, mitral regurgitation HTN  Disposition: SNF  Discharge Condition: improved/stable  Discharge Exam:  Filed Vitals:   03/27/15 0459 03/27/15 1049  BP: 135/66 132/72  Pulse: 99 92  Temp: 98.4 F (36.9 C) 98.4 F (36.9 C)  Resp: 21 20  General -- 81yo female resting comfortably in no apparent distress, flat affect. Chest -- CTAB; normal effort at rest, on 2L [place] with good saturations  Cardiac -- irregularly irregular. No murmur appreciated Abdomen -- soft, nontender. No masses palpable. Bowel sounds present.. Extremities - no tenderness or effusions noted. Pulses present bilaterally. Minimal trace edema noted around ankles bilaterally  Brief Hospital Course:   COPD Exacerbation: GOLD D Patient presented via EMS with shortness of breath and cough 3 days prior to presentation. She had been provided albuterol, Atrovent, Solu-Medrol, and supplemental oxygen via CPap by EMS. In the ED she was given a Venturi mask with good response to 4 L. However, she dropped down to the low 80's when attempting to reduce oxygen to her home level of 2 L. In the ED, patient was treated for HCAP with 1 dose of vancomycin and Fortaz. Patient was continued on Levaquin for 7 days for COPD exacerbation.  It was thought that an infectious process was unlikely as patient was afebrile, had  leukocytosis on admit but resolved quickly, and had no specific region of consolidation on chest x-ray. Additionally, patient was treated with 5 day course of Prednisone, and a decision was made to continue a steroid taper due to GOLD D severity of COPD. The steroid taper regimen is the following: Started 11/29 with Prednisone  daily for 3 days, then  daily for 3 days, then  daily for 3 days, then  daily for 3 days. On day of discharge, patient had good O2 saturations with home O2 2L [place]. Her lung exam was unremarkable on day of discharge. Blood cultures collected in the ED showed no growth x 5 days.    Symptoms were not concerning for ACS with no acute changes in EKG and unremarkable troponins. Patient's dyspnea was likely contributed by Afib with RVR (noted below).    Afib with RVR, RVR resolved: Blood pressure was stable through out hospital course. Patient is not currently on anticoagulation due to previous GI bleed due to AVM (CHADS2 4). Patient is followed by Dr. Rejeana Brock at [facility]. Cardiology was consulted and increased home Cardizem dose to  daily. Home metoprolol was discontinued by cardiology, however this was eventually restarted for better HR control. HR was in the upper 90s on day of discharge.   E Coli Positive Urine Culture:  Patient was asymptomatic, however this was covered by Levaquin.   Mild AKI:  Creatinine peaked at 1.53 (baseline around 1.1-1.2). Home Lasix was held and then restarted and held again due to creatinine bump. Patient's creatinine returned to baseline therefore home Lasix was restarted. Home Kdur dose was held during hospitalization and was restarted on day  of discharge at a decreased dose of daily (instead of BID)  T2DM: Home Lantus is 15 units per day. Patient was started on Lantus 10 units daily in addition to sensitive SSI while in the hospital. Patient was discharged with Lantus 10 due to stable CBGs. Consider increasing  Lantus dose if CBGs are elevated.    Issues for Follow Up:  - Patient is to complete a steroid taper. This was started on 11/29:  with Prednisone 50mg  daily for 3 days, then 25mg  daily for 3 days, then 10mg  daily for 3 days, then 5mg  daily for 3 days.  - Patient was discharged with Lantus 10 due to stable CBGs (home Lantus is 15units). Consider increasing Lantus dose if CBGs are elevated. - Consider repeat BMP to monitor creatinine (at baseline on discharge) and potassium - consider starting antidepressant medication; patient's mood may benefit from med  Significant Procedures: none  Significant Labs and Imaging:   Recent Labs Lab 03/25/15 0515  WBC 21.2*  HGB 10.6*  HCT 33.4*  PLT 291    Recent Labs Lab 03/23/15 0236 03/24/15 0955 03/25/15 0515 03/26/15 0450 03/27/15 0539  NA 136 134* 134* 136 136  K 4.2 3.8 4.8 4.6 3.8  CL 94* 93* 94* 97* 92*  CO2 36* 33* 33* 32 38*  GLUCOSE 206* 136* 213* 168* 125*  BUN 48* 38* 41* 37* 40*  CREATININE 1.37* 1.11* 1.41* 1.10* 1.13*  CALCIUM 10.1 9.8 9.5 9.5 9.5  ABG on admit: pH 7.399, Co2 43.9, O2 91; Bicarb 26.9 ABG 11/27: pH 7.422, CO2 50.5, O2 98.4, Bicab 32.3   Troponins: 0.04 > 0.06 > 0.04  CXR 11/21: IMPRESSION: 1. Perhaps slightly increased patchy nonspecific opacities in both lung bases which may represent atelectasis or infiltrate. 2. Otherwise, stable appearance of chronic bronchitic change, pulmonary vascular congestion and interstitial prominence compared to prior imaging.  CXR 11/27: FINDINGS: There is developing basilar opacity bilaterally which may represent small effusions or bibasilar airspace consolidation or both. No other significant interval changes are evident. There is no pneumothorax. There is prior sternotomy and CABG.  IMPRESSION: Developing basilar opacities bilaterally, due to effusions or airspace consolidation or both.  Results/Tests Pending at Time of Discharge: none  Discharge  Medications:    Medication List    STOP taking these medications        diltiazem 120 MG 24 hr capsule  Commonly known as:  CARDIZEM CD  Replaced by:  diltiazem 240 MG 24 hr capsule      TAKE these medications        albuterol 108 (90 BASE) MCG/ACT inhaler  Commonly known as:  PROVENTIL HFA;VENTOLIN HFA  Inhale 2 puffs into the lungs every 4 (four) hours as needed for wheezing or shortness of breath.     albuterol (2.5 MG/3ML) 0.083% nebulizer solution  Commonly known as:  PROVENTIL  Take 2.5 mg by nebulization every 6 (six) hours as needed for wheezing.     ALPRAZolam 0.25 MG tablet  Commonly known as:  XANAX  Take 1 tablet (0.25 mg total) by mouth at bedtime as needed for anxiety.     aspirin EC 81 MG tablet  Take 81 mg by mouth daily.     atorvastatin 40 MG tablet  Commonly known as:  LIPITOR  Take 0.5 tablets (20 mg total) by mouth daily at 6 PM.     diltiazem 240 MG 24 hr capsule  Commonly known as:  CARDIZEM CD  Take 1 capsule (240 mg  total) by mouth daily.  Start taking on:  03/28/2015     feeding supplement (ENSURE ENLIVE) Liqd  Take 237 mLs by mouth daily.     feeding supplement (PRO-STAT SUGAR FREE 64) Liqd  Take 30 mLs by mouth daily.     furosemide 40 MG tablet  Commonly known as:  LASIX  Take 1 tablet (40 mg total) by mouth daily.  Start taking on:  03/28/2015     insulin aspart 100 UNIT/ML injection  Commonly known as:  NOVOLOG  Before each meal 3 times a day, 140-199 - 2 units, 200-250 - 4 units, 251-299 - 6 units,  300-349 - 8 units,  350 or above 10 units. Dispense syringes and needles as needed, Ok to switch to PEN if approved. Substitute to any brand approved. DX DM2, Code E11.65     insulin glargine 100 UNIT/ML injection  Commonly known as:  LANTUS  Inject 0.1 mLs (10 Units total) into the skin at bedtime.     magnesium oxide 400 MG tablet  Commonly known as:  MAG-OX  Take 400 mg by mouth daily.     metoprolol succinate 25 MG 24 hr tablet   Commonly known as:  TOPROL-XL  Take 1 tablet (25 mg total) by mouth daily.     nitroGLYCERIN 0.4 MG SL tablet  Commonly known as:  NITROSTAT  Place 0.4 mg under the tongue every 5 (five) minutes as needed for chest pain.     ondansetron 8 MG tablet  Commonly known as:  ZOFRAN  Take 1 tablet (8 mg total) by mouth every 8 (eight) hours as needed for nausea or vomiting.     OXYGEN  Inhale into the lungs. 2L continously     pantoprazole 40 MG tablet  Commonly known as:  PROTONIX  Take 1 tablet (40 mg total) by mouth daily.     polyethylene glycol packet  Commonly known as:  MIRALAX / GLYCOLAX  Take 17 g by mouth daily as needed for mild constipation or moderate constipation.     potassium chloride SA 20 MEQ tablet  Commonly known as:  K-DUR,KLOR-CON  Take 1 tablet (20 mEq total) by mouth daily.  Start taking on:  03/28/2015     predniSONE 50 MG tablet  Commonly known as:  DELTASONE  Please take 50mg  daily (1 tablet) for the next day (12/1), then take 25mg  daily (half tablet) for 3 days     predniSONE 5 MG tablet  Commonly known as:  DELTASONE  Please take 10mg  for 3 days (starting 12/5), then take 5mg  daily for 3 days  Start taking on:  04/01/2015     senna 8.6 MG Tabs tablet  Commonly known as:  SENOKOT  Take 8.6 mg by mouth daily as needed for mild constipation or moderate constipation.     SPIRIVA HANDIHALER 18 MCG inhalation capsule  Generic drug:  tiotropium  INHALE THE CONTENTS OF 1 CAPSULE DAILY     SYMBICORT 160-4.5 MCG/ACT inhaler  Generic drug:  budesonide-formoterol  USE 2 INHALATIONS TWICE A DAY        Discharge Instructions: Please refer to Patient Instructions section of EMR for full details.  Patient was counseled important signs and symptoms that should prompt return to medical care, changes in medications, dietary instructions, activity restrictions, and follow up appointments.   Follow-Up Appointments: Follow-up Information    Follow up with  [name],[name], MD.   Specialty:  Family Medicine   Why:  please make a  hospital follow up appointment in 1-2 weeks    Contact information:   [address] [place] [address] [phone]       Follow up with Oretha Milch., MD.   Specialty:  Pulmonary Disease   Why:  Please call to make an appointment with Dr. Vassie Loll soon (let the clinic know that you were hospitalized for COPD flare)    Contact information:   [address] [place] [address] [phone]       Follow up with Lars Masson, MD.   Specialty:  Cardiology   Why:  Please make an appointment with your cardiologist (Dr. Delton See) soon. Please let the clinic know that you were hospitalized for Atrial Fibrillation with Rapid Rate   Contact information:   [address] [place] [address] [phone]      Palma Holter, MD 03/27/2015, 8:55 PM PGY-1, [facility] Family Medicine

## 2015-03-22 NOTE — Progress Notes (Signed)
Family Medicine Teaching Service Daily Progress Note Intern Pager: [phone]  Patient name: [name] Medical record number: [id number] Date of birth: Jan 10, 1930 Age: 81 y.o. Gender: female  Primary Care Provider: Kaleen Mask, MD Consultants: none Code Status: FULL  Pt Overview and Major Events to Date:  11/21: Admitted for dyspnea likely due to COPD exacerbation 11/22: Increased Cardizem dose; discontinued Toprol XL per cards 11/24: HR controlled at rest  Assessment and Plan: [name] is a 81 y.o. female presenting with dyspnea and cough found to be in Afib with RVR. PMH is significant for COPD (on home oxygen, 2 L), A. fib with RVR (not on anticoad 2/2 hx GI bleed 2/2 AVMs), chronic diastolic CHF, s/p CABG, CAD, PAD, aortic valve sclerosis, mitral regurgitation, hypertension, hyperlipidemia, DM2, aortic aneurysm, and tobacco abuse.  A. fib with RVR, RVR resolved: Patient in atrial fibrillation, rate controlled on below medicines. Blood pressure stable/at goal. Patient is not currently on anticoagulation due to previous GI bleed due to AVM (CHADS2 4). Patient is followed by Dr. Rejeana Brock at [facility]. - On telemetry - Cardiology consulted, appreciate recs:  - Cardizem to  daily (increased from home dose) - Discontinued Metoprolol (per cards) last dose 11/22    Dyspnea, likely secondary to COPD exacerbation: Gold D COPD; On 2 L of oxygen at home. Required Venturi mask in ED. ED initially treated for HCAP, providing 1 dose of vancomycin and Fortaz. Unlikely infectious process as currently afebrile, leukocytosis on admit but resolved quickly, and no specific region of consolidation on chest x-ray.  Not concerning for ACS with no acute changes in EKG and unremarkable troponins. Considered CHF however, BNP is only mildly elevated, CXR not concerning for pulmonary edema, and no weight gain compared to outpatient weights. Possibly contributed by Afib with RVR.  CXR on admit: flat diaphragms, hyperinflation; slightly increased patchy nonspecific opacities in both lung bases- atelectasis vs infiltrate; ABG fairly unremarkable - Levaquin per pharmacy (11/21>>)  - Prednisone 50 mg daily, additional  11/24 for total daily  x 1 day - Continue home Spiriva, Symbicort, albuterol nebulizer PRN. - ED obtained blood and urine cultures: NGTD, urine cx < 100,000 EColi - PT consulted: SNF; No OT needs at this time - incentive spirometry   E. Coli positive urine culture: urine cx 11/21 >100k e.coli pansensitive. - UA: trace LE, Pos Nitrites, many bacteria, WBC 6-30 - should be covered by levaquin already  Mild Hyperkalemia: went up to 5.6 (no report on whether hemolysis present), now resolved - will monitor - held home Kdur    Mild AKI: Resolved Ce 1.09 (baseline Cr 1.1-1.2) - Cr 1.46 > 1.53 > 1.09 - will continue to monitor - consider restarting Lasix   Normocytic Anemia: Hgb on admit 10.9.> 9.3 >9.4 on 11/23 (Baseline ~9) - continue to monitor  Chronic diastolic CHF/CABG (2005)/CAD/PAD/aortic valve sclerosis/mitral regurgitation/hypertension: CHF last EF of 55%- 60% - consider restarting Lasix 40 mg as Cr improved, aspirin 81 mg daily - ASA - Lipitor - Home nitroglycerin tablet when necessary for chest pain  Type 2 diabetes: Patient on injectable insulin at home. CBGs stable, 155-262 - Lantus 10 units daily (home dose 15 units daily) - Sliding-scale insulin sensitive  Hyperlipidemia - Continue Lipitor 20 mg daily  FEN/GI: IV fluids KVO, heart healthy/carb modified diet; Protonix PO Prophylaxis: Lovenox  Disposition: pending clinical improvement; SNF (SW on board)  Subjective:  Feels weak today. Thinks breathing is about the same. Has a non-productive cough still.  Objective: Temp:  [97.3 F (36.3 C)-97.4 F (36.3 C)] 97.4 F (36.3 C) (11/25 0530) Pulse Rate:  [86-96] 86 (11/25 0530) Resp:  [18] 18 (11/25 0530) BP:  (130-136)/(64-79) 130/64 mmHg (11/25 0530) SpO2:  [97 %-98 %] 98 % (11/25 0530) Weight:  [181 lb 6.4 oz (82.283 kg)] 181 lb 6.4 oz (82.283 kg) (11/25 0530) Physical Exam: General -- NAD Chest --  Mild crackles at the bases (overall improved); normal effort at rest, on 2.5L [place] with good saturations  Cardiac -- irregularly irregular, normal rate . No murmur appreciated Abdomen -- soft, nontender. No masses palpable. Bowel sounds present.. Extremities - no tenderness or effusions noted. .  Laboratory:  Recent Labs Lab 03/18/15 1605 03/19/15 0304 03/20/15 0348  WBC 12.3* 9.2 8.3  HGB 10.9* 9.3* 9.4*  HCT 35.7* 31.3* 30.9*  PLT 199 170 226    Recent Labs Lab 03/18/15 1605 03/19/15 0304 03/20/15 0348  NA 138 135 135  K 4.8 5.6* 4.7  CL 98* 99* 98*  CO2 30 29 31   BUN 32* 34* 45*  CREATININE 1.43* 1.46* 1.53*  CALCIUM 10.0 9.7 10.1  GLUCOSE 110* 170* 167*    Palma HolterKanishka G Gunadasa, MD 03/22/2015, 6:35 AM PGY-1, [place] [facility] FPTS Intern pager: [phone], text pages welcome

## 2015-03-23 LAB — CULTURE, BLOOD (ROUTINE X 2)
Culture: NO GROWTH
Culture: NO GROWTH

## 2015-03-23 LAB — BASIC METABOLIC PANEL
ANION GAP: 6 (ref 5–15)
BUN: 48 mg/dL — ABNORMAL HIGH (ref 6–20)
CHLORIDE: 94 mmol/L — AB (ref 101–111)
CO2: 36 mmol/L — ABNORMAL HIGH (ref 22–32)
Calcium: 10.1 mg/dL (ref 8.9–10.3)
Creatinine, Ser: 1.37 mg/dL — ABNORMAL HIGH (ref 0.44–1.00)
GFR calc non Af Amer: 35 mL/min — ABNORMAL LOW (ref >60)
GFR, EST AFRICAN AMERICAN: 40 mL/min — AB (ref >60)
Glucose, Bld: 206 mg/dL — ABNORMAL HIGH (ref 65–99)
POTASSIUM: 4.2 mmol/L (ref 3.5–5.1)
SODIUM: 136 mmol/L (ref 135–145)

## 2015-03-23 LAB — GLUCOSE, CAPILLARY
GLUCOSE-CAPILLARY: 127 mg/dL — AB (ref 65–99)
GLUCOSE-CAPILLARY: 131 mg/dL — AB (ref 65–99)
GLUCOSE-CAPILLARY: 227 mg/dL — AB (ref 65–99)
GLUCOSE-CAPILLARY: 267 mg/dL — AB (ref 65–99)

## 2015-03-23 NOTE — Progress Notes (Signed)
Family Medicine Teaching Service Daily Progress Note Intern Pager: [phone]  Patient name: [name] Medical record number: [id number] Date of birth: 12-Sep-1929 Age: 81 y.o. Gender: female  Primary Care Provider: [name],[name], MD Consultants: none Code Status: FULL  Pt Overview and Major Events to Date:  11/21: Admitted for dyspnea likely due to COPD exacerbation 11/22: Increased Cardizem dose; discontinued Toprol XL per cards 11/24: HR controlled at rest  Assessment and Plan: [name] is a 81 y.o. female who presented with dyspnea and cough found to be in Afib with RVR. PMH is significant for COPD (on home oxygen, 2 L), A. fib with RVR (not on anticoad 2/2 hx GI bleed 2/2 AVMs), chronic diastolic CHF, s/p CABG, CAD, PAD, aortic valve sclerosis, mitral regurgitation, hypertension, hyperlipidemia, DM2, aortic aneurysm, and tobacco abuse.  A. fib with RVR, RVR resolved: Patient presented in atrial fibrillation, rate controlled on below medicines. Blood pressure stable/at goal. Patient is not currently on anticoagulation due to previous GI bleed due to AVM (CHADS2 4). Patient is [facility] - On telemetry - Cardiology consulted, appreciate recs:  - Cardizem to 240mg  daily (increased from home dose) - Discontinued Metoprolol (per cards) last dose 11/22    Dyspnea, secondary to COPD exacerbation: Gold D COPD; On 2 L of oxygen at home. Required Venturi mask in ED. CXR w/ no specific region of consolidation, and not concerning for pulmonary edema. Signs of hyperinflation. ABG fairly unremarkable - Levaquin per pharmacy (11/21>>) Will DC either today or after tomorrow's dose  - Prednisone 50 mg daily >> DC today 11/26 - Continue home Spiriva, Symbicort, albuterol nebulizer PRN. - ED obtained blood and urine cultures: NGTD, urine cx >100,000 EColi - PT consulted: SNF; No OT needs at this time - incentive spirometry   E. Coli  positive urine culture: urine cx 11/21 >100k e.coli pansensitive. - UA: trace LE, Pos Nitrites, many bacteria, WBC 6-30 - should be covered by levaquin  Mild Hyperkalemia: resolved - will monitor - held home Kdur    Mild AKI: Resolved Ce 1.09 (baseline Cr 1.1-1.2) - Cr 1.46 > 1.53 > 1.09 > 1.37 - will continue to monitor - Restarted Lasix 11/25 >> Cr bumped >> holding today.  Normocytic Anemia: Hgb on admit 10.9.> 9.3 >9.4 on 11/23 (Baseline ~9) - continue to monitor  Chronic diastolic CHF/CABG (2005)/CAD/PAD/aortic valve sclerosis/mitral regurgitation/hypertension: CHF last EF of 55%- 60% - Lasix restarted yesterday (11/25) >> Cr bumped again, holding today, ?consider every-other-day dosing - aspirin 81 mg daily - ASA - Lipitor - Home nitroglycerin tablet when necessary for chest pain  Type 2 diabetes: Patient on injectable insulin at home. CBGs stable, 127-216 - Lantus 10 units daily (home dose 15 units daily) - Sliding-scale insulin sensitive  Hyperlipidemia - Continue Lipitor 20 mg daily  FEN/GI: IV fluids KVO, heart healthy/carb modified diet; Protonix PO Prophylaxis: Lovenox  Disposition: pending clinical improvement; SNF (SW on board)  Subjective:  Feels well today. No significant changes in status overnight.   Objective: Temp:  [97.5 F (36.4 C)-97.7 F (36.5 C)] 97.6 F (36.4 C) (11/26 0607) Pulse Rate:  [52-77] 71 (11/26 0607) Resp:  [18] 18 (11/26 0607) BP: (129-151)/(72-78) 139/76 mmHg (11/26 0607) SpO2:  [97 %-100 %] 100 % (11/26 0607) Weight:  [180 lb 9.6 oz (81.92 kg)] 180 lb 9.6 oz (81.92 kg) (11/26 [id number]) Physical Exam: General -- NAD, sitting up in bed, flat affect. Chest --  Minimal crackles at the  bases (overall improved); normal effort at rest, on 2L [place] with good saturations  Cardiac -- irregularly irregular, normal rate. No murmur appreciated Abdomen -- soft, nontender. No masses palpable. Bowel sounds present.. Extremities - no tenderness or  effusions noted. Pulses present bilaterally. No edema.  Laboratory:  Recent Labs Lab 03/18/15 1605 03/19/15 0304 03/20/15 0348  WBC 12.3* 9.2 8.3  HGB 10.9* 9.3* 9.4*  HCT 35.7* 31.3* 30.9*  PLT 199 170 226    Recent Labs Lab 03/20/15 0348 03/22/15 0713 03/23/15 0236  NA 135 137 136  K 4.7 3.8 4.2  CL 98* 98* 94*  CO2 31 33* 36*  BUN 45* 34* 48*  CREATININE 1.53* 1.09* 1.37*  CALCIUM 10.1 10.3 10.1  GLUCOSE 167* 152* 206*    Kathee Delton, MD 03/23/2015, 9:43 AM PGY-2, [place] [facility] FPTS Intern pager: [phone], text pages welcome

## 2015-03-24 ENCOUNTER — Inpatient Hospital Stay (HOSPITAL_COMMUNITY): Payer: Medicare Other

## 2015-03-24 ENCOUNTER — Encounter (HOSPITAL_COMMUNITY): Payer: Self-pay | Admitting: Rehabilitation

## 2015-03-24 LAB — BLOOD GAS, ARTERIAL
Acid-Base Excess: 7.7 mmol/L — ABNORMAL HIGH (ref 0.0–2.0)
Bicarbonate: 32.3 mEq/L — ABNORMAL HIGH (ref 20.0–24.0)
DRAWN BY: 24486
Delivery systems: POSITIVE
Expiratory PAP: 5
FIO2: 0.4
INSPIRATORY PAP: 10
O2 SAT: 97.7 %
PATIENT TEMPERATURE: 98.6
PCO2 ART: 50.5 mmHg — AB (ref 35.0–45.0)
PO2 ART: 98.4 mmHg (ref 80.0–100.0)
RATE: 10 resp/min
TCO2: 33.9 mmol/L (ref 0–100)
pH, Arterial: 7.422 (ref 7.350–7.450)

## 2015-03-24 LAB — BASIC METABOLIC PANEL
Anion gap: 8 (ref 5–15)
BUN: 38 mg/dL — AB (ref 6–20)
CO2: 33 mmol/L — ABNORMAL HIGH (ref 22–32)
CREATININE: 1.11 mg/dL — AB (ref 0.44–1.00)
Calcium: 9.8 mg/dL (ref 8.9–10.3)
Chloride: 93 mmol/L — ABNORMAL LOW (ref 101–111)
GFR calc Af Amer: 52 mL/min — ABNORMAL LOW (ref >60)
GFR, EST NON AFRICAN AMERICAN: 45 mL/min — AB (ref >60)
Glucose, Bld: 136 mg/dL — ABNORMAL HIGH (ref 65–99)
Potassium: 3.8 mmol/L (ref 3.5–5.1)
SODIUM: 134 mmol/L — AB (ref 135–145)

## 2015-03-24 LAB — GLUCOSE, CAPILLARY
GLUCOSE-CAPILLARY: 126 mg/dL — AB (ref 65–99)
GLUCOSE-CAPILLARY: 134 mg/dL — AB (ref 65–99)
Glucose-Capillary: 172 mg/dL — ABNORMAL HIGH (ref 65–99)
Glucose-Capillary: 120 mg/dL — ABNORMAL HIGH (ref 65–99)

## 2015-03-24 MED ORDER — LORAZEPAM 2 MG/ML IJ SOLN
INTRAMUSCULAR | Status: AC
  Administered 2015-03-24: 1 mg
  Filled 2015-03-24: qty 1

## 2015-03-24 MED ORDER — METOPROLOL TARTRATE 1 MG/ML IV SOLN
5.0000 mg | INTRAVENOUS | Status: DC | PRN
  Administered 2015-03-24 – 2015-03-25 (×2): 5 mg via INTRAVENOUS
  Filled 2015-03-24 (×2): qty 5

## 2015-03-24 MED ORDER — ALBUTEROL SULFATE (2.5 MG/3ML) 0.083% IN NEBU: 2.5000 mg | INHALATION_SOLUTION | RESPIRATORY_TRACT | Status: DC | PRN

## 2015-03-24 MED ORDER — METHYLPREDNISOLONE SODIUM SUCC 40 MG IJ SOLR
40.0000 mg | Freq: Four times a day (QID) | INTRAMUSCULAR | Status: AC
  Administered 2015-03-24 – 2015-03-25 (×3): 40 mg via INTRAVENOUS
  Filled 2015-03-24 (×3): qty 1

## 2015-03-24 MED ORDER — GUAIFENESIN ER 600 MG PO TB12
600.0000 mg | ORAL_TABLET | Freq: Two times a day (BID) | ORAL | Status: DC
  Administered 2015-03-25 – 2015-03-27 (×5): 600 mg via ORAL
  Filled 2015-03-24 (×7): qty 1

## 2015-03-24 MED ORDER — ALBUTEROL SULFATE (2.5 MG/3ML) 0.083% IN NEBU
2.5000 mg | INHALATION_SOLUTION | RESPIRATORY_TRACT | Status: AC
  Administered 2015-03-25 (×4): 2.5 mg via RESPIRATORY_TRACT
  Filled 2015-03-24 (×4): qty 3

## 2015-03-24 NOTE — Progress Notes (Signed)
Breath sounds very diminished and pt very SOB. Breathing treatment given and respiratory at bedside. Rapid response and Dr. Jeanene Erballed. Chest X-Ray ordered.

## 2015-03-24 NOTE — Progress Notes (Addendum)
FPTS Interim Progress Note  S:Paged by RN regarding acute worsening respiratory status, rapid response called, at approx 2030. Per nursing and RT, patient attempted to get out of bed on her own and go to bedside commode, this caused her HR to increase and acute dyspnea. She was evaluated by RT and given albuterol nebulizer x 2 without significant improvement and reportedly had diminished air movement. She was placed on BiPAP around 2100. Reported interval improvement in respiratory distress by RT after BiPAP for several minutes, some improved air movement.  Went to evaluate patient promptly, she was resting in bed, reports BiPAP mask helped her breathing a "little" but still short of breath. Denies any pain in chest or anywhere else in body.  O: BP 134/86 mmHg  Pulse 129  Temp(Src) 97.4 F (36.3 C) (Oral)  Resp 21  Ht 6' (1.829 m)  Wt 180 lb 9.6 oz (81.92 kg)  BMI 24.49 kg/m2  SpO2 100%  Gen - elderly female, chronically ill, sitting up in bed, BIPAP mask on, improving distress Heart - tachycardic irregularly irregular up 120-140 on monitor then improved to 80-100, no murmurs heard Lungs - Bilateral diminished breath sounds all over with some improved air movement mid to upper lung fields, bases diminished with some crackles, rhonchi. No obvious wheezing. Skin - warm, dry Ext - no edema Neuro - awake, alert, baseline poor orientation  A/P: Briefly, [name] is an 81 yr female with complex PMH with known COPD, CAD s/p CABG, Chronic diastolic CHF, AFib, AS, HTN, HLD, DM2, tobacco abuse, admitted on 03/18/15 with AECOPD.  # AECOPD, improving now with worsening acute on chronic respiratory failure Severe acute resp distress from ambulating to bedside commode, no other obvious trigger. No CP. S/p albuterol nebs without resolution, placed on BiPAP per RT, with interval improvement. - S/p Levaquin antibiotic course, Prednisone burst therapy - Check CXR stat, eval for any acute fluid  overload, despite clinical without evidence overload - Continue BiPAP - Transfer to SDU for closer monitoring and continuous BiPAP - Check ABG in 1 hr on BiPAP, discussed with RT - Contacted PCCM E-link to notify attending of worsening resp status. Discussed case with Dr Arsenio Loader, recommended to resume IV steroids, schedule nebulizers (albuterol if concern thick secretions), follow-up CXR. - Start Solumedrol IV  q 6 hr scheduled x 3 doses for repeat steroid burst - Start Albuterol Nebulizers scheduled q 4 hr overnight and q 2 hr PRN - Review CXR results with developing bilateral basilar opacities vs effusion or consolidation - Consider lasix, less likely given CXR, of note last dose 2 days ago home PO 40 - Concern may develop underlying PNA in setting of prolonged hospitalization. Despite Levaquin therapy since 11/21. However afebrile at this time. - Continue Levaquin 750 PO course with last dose tonight - Check CBC with diff in AM  - If spike fever overnight, will add repeat blood cultures and will broaden IV antibiotics to Vancomycin / Zosyn  # Atrial Fibrillation with RVR Possible etiology of triggered worsening dyspnea now with RVR to 120-140s, improving after evaluation down to 100s, now on BiPAP. - Already on increased dose Cardizem, Cardiology has signed off - Consider Metoprolol IV PRN if BP stable (last checked SBP 150-160s, with MAP >85)  UPDATE 2212 Patient transferred to SDU on 3 [place]. Remains on BiPAP. Persistent AFib RVR with HR 130-140 sustained. Paged by RN. - EKG AFib RVR - Start Metoprolol IV  q 4 hr PRN sustained tachycardia >130 - Decided against  starting Diltiazem IV drip given currently already on Cardizem 240mg  24 hr tab, and received this earlier today - Pending ABG currently  UPDATE 2245 - HR improved down to 100. S/p Metoprolol IV 5mg  x 1 dose - ABG results on about 2 hr BiPAP with: pH 7.422, pCO2 50.5; pO2 98.4; %O2 Sat 97.7, consistent with prior, last  ABG on admit with CO2 in mid 40s. Likely mild worsening with a little more CO2 retention. Oxygenating appropriately. Chronicly compensated COPD. - Continue BiPAP overnight  Smitty CordsAlexander J Karamalegos, DO 03/24/2015, 9:16 PM PGY-3, [facility] pager [phone]

## 2015-03-24 NOTE — Progress Notes (Signed)
RT note: RT called rapid response because patient was in severe respiratory distress and acutely dimmensed over all areas. RT gave patient second prn albuterol treatment and placed patient on BIPAP per RCP protocol.

## 2015-03-24 NOTE — Progress Notes (Signed)
Family Medicine Teaching Service Daily Progress Note Intern Pager: [phone]  Patient name: [name] Medical record number: [id number] Date of birth: [birth year] Age: 81 y.o. Gender: female  Primary Care Provider: Kaleen Mask, MD Consultants: none Code Status: FULL  Assessment and Plan: 81 y.o. female who presented with dyspnea and cough found to be in Afib with RVR. Now being treated for COPD exacerbation. PMH is significant for COPD (on home oxygen, 2 L), A. fib with RVR (not on anticoad 2/2 hx GI bleed 2/2 AVMs), chronic diastolic CHF, s/p CABG, CAD, PAD, aortic valve sclerosis, mitral regurgitation, hypertension, hyperlipidemia, DM2, aortic aneurysm, and tobacco abuse.  # Dyspnea, secondary to COPD exacerbation: Gold D COPD; On 2 L of oxygen at home. - Currently on 2L [place]  - Levaquin per pharmacy (11/21>>11/27). Discontinue today. - Prednisone 50 mg daily discontinued 03/23/15 - Continue home Spiriva, Symbicort, albuterol nebulizer PRN. - Social Work for [employer] placement - Incentive Spirometry   # A. fib with RVR, RVR resolved: HR 111. Patient is not currently on anticoagulation due to previous GI bleed due to AVM (CHADS2 4). Patient is followed by Dr. [facility]jeana Brock at [facility]. - On telemetry - Cardiology consulted, appreciate recs  - Cardizem to  daily (increased from home dose) - Discontinued Metoprolol (per cards) last dose 11/22    # E. Coli positive urine culture: Urinalysis with trace LE, positive nitrites, many bacteria, WBC 6-30. Urine cx 11/21 >100k e.coli pansensitive. - Levaquin (11/2>>11/27)  # Mild AKI: Resolved Ce 1.09 (baseline Cr 1.1-1.2) - Today's creatinine pending - Continue to monitor - Holding home lasix today. Consider restarting pending creatinine  # Chronic diastolic CHF/CABG (2005)/CAD/PAD/aortic valve sclerosis/mitral regurgitation/hypertension: CHF last EF of 55%- 60% - Holding Lasix given elevation in creatinine, consider  restarting pending creatinine. Consider every-other-day dosing - Aspirin 81 mg daily - Lipitor - Home nitroglycerin tablet when necessary for chest pain  # Type 2 diabetes: Patient on injectable insulin at home - Lantus 10 units daily (home dose 15 units daily) - Sliding-scale insulin sensitive  FEN/GI: IV fluids KVO, heart healthy/carb modified diet; Protonix PO Prophylaxis: Lovenox  Disposition: pending clinical improvement; SNF (SW on board)  Subjective:  Feels improved from admission, however continues to note mild dyspnea consistent with baseline. Denies pain. Has not thought about SNF placement, but agrees that she might need to go somewhere to work on her strength. No acute complaints today.  Objective: Temp:  [97.4 F (36.3 C)-97.9 F (36.6 C)] 97.4 F (36.3 C) (11/27 0531) Pulse Rate:  [60-124] 111 (11/27 0531) Resp:  [18] 18 (11/27 0531) BP: (102-157)/(69-84) 102/69 mmHg (11/27 0531) SpO2:  [98 %-100 %] 98 % (11/27 0531) Physical Exam: General -- 81yo female resting comfortably in no apparent distress, flat affect. Chest --  Minimal crackles at the bases; normal effort at rest, on 2L [place] with good saturations  Cardiac -- irregularly irregular, tachycardic. No murmur appreciated Abdomen -- soft, nontender. No masses palpable. Bowel sounds present.. Extremities - no tenderness or effusions noted. Pulses present bilaterally. No edema.  Laboratory:  Recent Labs Lab 03/18/15 1605 03/19/15 0304 03/20/15 0348  WBC 12.3* 9.2 8.3  HGB 10.9* 9.3* 9.4*  HCT 35.7* 31.3* 30.9*  PLT 199 170 226    Recent Labs Lab 03/20/15 0348 03/22/15 0713 03/23/15 0236  NA 135 137 136  K 4.7 3.8 4.2  CL 98* 98* 94*  CO2 31 33* 36*  BUN 45* 34* 48*  [phone]* 1.09* 1.37*  CALCIUM 10.1 10.3 10.1  GLUCOSE 167* 152* 206*   Urinalysis    Component Value Date/Time   COLORURINE AMBER* 03/18/2015 1815   APPEARANCEUR CLOUDY* 03/18/2015 1815   LABSPEC 1.016 03/18/2015 1815    PHURINE 5.0 03/18/2015 1815   GLUCOSEU NEGATIVE 03/18/2015 1815   HGBUR NEGATIVE 03/18/2015 1815   BILIRUBINUR NEGATIVE 03/18/2015 1815   KETONESUR NEGATIVE 03/18/2015 1815   PROTEINUR NEGATIVE 03/18/2015 1815   UROBILINOGEN 1.0 11/18/2014 1541   NITRITE POSITIVE* 03/18/2015 1815   LEUKOCYTESUR TRACE* 03/18/2015 1815  - BNP 153.9 - Troponin [address]0.06>0.04  [place] N UrbanaRumley, DO 03/24/2015, 7:24 AM PGY-2, [facility] FPTS Intern pager: [phone], text pages welcome

## 2015-03-24 NOTE — Progress Notes (Signed)
Called per [name] Respiratory [name] at 2030 regarding Pt in severe respiratory distress. Upon my arrival Pt found in bed , RR 35-60, increased WOB, with accessory muscle use and severe anxiety. Placed on NRB with po2 sats 98-100% Lung sounds diminished with very little air movement. Pt awake alert able to follow simple commands. Teaching Service Resident [name] called per floor RN. Bipap placed per RRT protocol at 2045. Pt RR improved and WOB improved after BIPAP placement. [name] to bedside at 2055 to assess Pt. Verbal orders received for lorazepam 1mg  and SDU transfer ASAP. CXR ordered and completed STAT.  2110-Pt resting quietly, RR 25-30, Po2 97-100%, calm denies pain. Pt transferred to [facility] [place] [facility]. [place] report provided and Pt transferred to [facility] [place] [facility]

## 2015-03-25 DIAGNOSIS — Z794 Long term (current) use of insulin: Secondary | ICD-10-CM

## 2015-03-25 DIAGNOSIS — I25119 Atherosclerotic heart disease of native coronary artery with unspecified angina pectoris: Secondary | ICD-10-CM

## 2015-03-25 DIAGNOSIS — N179 Acute kidney failure, unspecified: Secondary | ICD-10-CM

## 2015-03-25 DIAGNOSIS — E119 Type 2 diabetes mellitus without complications: Secondary | ICD-10-CM | POA: Insufficient documentation

## 2015-03-25 DIAGNOSIS — J9600 Acute respiratory failure, unspecified whether with hypoxia or hypercapnia: Secondary | ICD-10-CM

## 2015-03-25 DIAGNOSIS — I5032 Chronic diastolic (congestive) heart failure: Secondary | ICD-10-CM

## 2015-03-25 LAB — CBC WITH DIFFERENTIAL/PLATELET
Basophils Absolute: 0 10*3/uL (ref 0.0–0.1)
Basophils Relative: 0 %
EOS ABS: 0 10*3/uL (ref 0.0–0.7)
EOS PCT: 0 %
HCT: 33.4 % — ABNORMAL LOW (ref 36.0–46.0)
Hemoglobin: 10.6 g/dL — ABNORMAL LOW (ref 12.0–15.0)
LYMPHS ABS: 0.5 10*3/uL — AB (ref 0.7–4.0)
Lymphocytes Relative: 2 %
MCH: 28.4 pg (ref 26.0–34.0)
MCHC: 31.7 g/dL (ref 30.0–36.0)
MCV: 89.5 fL (ref 78.0–100.0)
MONO ABS: 0.2 10*3/uL (ref 0.1–1.0)
MONOS PCT: 1 %
Neutro Abs: 20.5 10*3/uL — ABNORMAL HIGH (ref 1.7–7.7)
Neutrophils Relative %: 97 %
PLATELETS: 291 10*3/uL (ref 150–400)
RBC: 3.73 MIL/uL — AB (ref 3.87–5.11)
RDW: 14.2 % (ref 11.5–15.5)
WBC: 21.2 10*3/uL — AB (ref 4.0–10.5)

## 2015-03-25 LAB — BASIC METABOLIC PANEL
Anion gap: 7 (ref 5–15)
BUN: 41 mg/dL — AB (ref 6–20)
CHLORIDE: 94 mmol/L — AB (ref 101–111)
CO2: 33 mmol/L — ABNORMAL HIGH (ref 22–32)
CREATININE: 1.41 mg/dL — AB (ref 0.44–1.00)
Calcium: 9.5 mg/dL (ref 8.9–10.3)
GFR calc Af Amer: 39 mL/min — ABNORMAL LOW (ref >60)
GFR calc non Af Amer: 33 mL/min — ABNORMAL LOW (ref >60)
GLUCOSE: 213 mg/dL — AB (ref 65–99)
Potassium: 4.8 mmol/L (ref 3.5–5.1)
SODIUM: 134 mmol/L — AB (ref 135–145)

## 2015-03-25 LAB — GLUCOSE, CAPILLARY
GLUCOSE-CAPILLARY: 216 mg/dL — AB (ref 65–99)
Glucose-Capillary: 210 mg/dL — ABNORMAL HIGH (ref 65–99)
Glucose-Capillary: 201 mg/dL — ABNORMAL HIGH (ref 65–99)
Glucose-Capillary: 192 mg/dL — ABNORMAL HIGH (ref 65–99)

## 2015-03-25 LAB — MRSA PCR SCREENING: MRSA BY PCR: NEGATIVE

## 2015-03-25 MED ORDER — METOPROLOL TARTRATE 12.5 MG HALF TABLET
12.5000 mg | ORAL_TABLET | Freq: Two times a day (BID) | ORAL | Status: DC
  Administered 2015-03-25 (×2): 12.5 mg via ORAL
  Filled 2015-03-25 (×5): qty 1

## 2015-03-25 MED ORDER — PREDNISONE 50 MG PO TABS
50.0000 mg | ORAL_TABLET | Freq: Every day | ORAL | Status: DC
  Administered 2015-03-26 – 2015-03-27 (×2): 50 mg via ORAL
  Filled 2015-03-25 (×3): qty 1

## 2015-03-25 NOTE — Progress Notes (Signed)
[facility] bed on tele and Bipap with RR RN, 3E RN, and RT. Patient oriented to unit and room, instructed on callbell and placed at side. Bed alarm on. No family at bedside. Belongings sent with patient. Will continue to monitor.

## 2015-03-25 NOTE — Progress Notes (Signed)
Pt walked to bedside commode, HR afib 110's-140's, RR 20-30's, pt states she feels short of breath. [place] on 3L, o2 maintained greater than 90. Will continue to monitor.

## 2015-03-25 NOTE — Progress Notes (Signed)
UR COMPLETED  

## 2015-03-25 NOTE — Progress Notes (Signed)
Patient sleeping well at this time. Patient wearing oxygen set at 2lpm with no signs of respiratory distress. Patient does not want to wear BIPAP. Will continue to monitor patients respiratory status.

## 2015-03-25 NOTE — Progress Notes (Signed)
Family Medicine Teaching Service Daily Progress Note Intern Pager: [phone]  Patient name: [name] Medical record number: [id number] Date of birth: 1929/09/15 Age: 81 y.o. Gender: female  Primary Care Provider: Kaleen Mask, MD Consultants: none Code Status: FULL  Pt Overview and Major Events to Date:  11/21: Admitted for dyspnea likely due to COPD exacerbation 11/22: Increased Cardizem dose; discontinued Toprol XL per cards 11/24: HR controlled at rest 11/27: Had episode of dyspnea overnight requiring BiPaP and also Afib with RVR, now on 2L [place] and HR improved with PRN Metoprolol  11/28: restarted Metoprolol BID    Assessment and Plan: 81 y.o. female who presented with dyspnea and cough found to be in Afib with RVR. Now being treated for COPD exacerbation. PMH is significant for COPD (on home oxygen, 2 L), A. fib with RVR (not on anticoad 2/2 hx GI bleed 2/2 AVMs), chronic diastolic CHF, s/p CABG, CAD, PAD, aortic valve sclerosis, mitral regurgitation, hypertension, hyperlipidemia, DM2, aortic aneurysm, and tobacco abuse.  # Dyspnea, secondary to COPD exacerbation: Gold D COPD; On 2 L of oxygen at home. At acute episode overnight requiring BiPaP but is on 2L [place] this AM with good saturations.  Repeat ABG: pH 7.4, CO2 50.5, bicarb 32.3 (slightly increased CO2 from prior).  - Repeat CXR: Developing basilar opacities bilaterally, due to effusions or airspace consolidation or both.  - 5 day course Prednisone 50 mg daily completed 03/23/15; started IV solumedrol 11/27; will start taper 11/29: PO  x3 days, then  x 3 days, then  x 3 days, then  x 3 days  - Levaquin per pharmacy (11/21>>11/27).  - Albuterol q 4 hr and 12hr PRN  - Continue home Spiriva, Symbicort, albuterol nebulizer PRN. - Incentive Spirometry  - Social Work for SNF placement  # A. fib with RVR, RVR resolved: HR 97-132, low 100s.  Patient is not currently on anticoagulation due to previous GI bleed  due to AVM (CHADS2 4). Patient is followed by Dr. Rejeana Brock at [facility].  - On telemetry - Cardiology consulted, appreciate recs: signed off  - Cardizem to  daily (increased from home dose) - will restart Metoprolol 12.5 BID for better control of HR  # E. Coli positive urine culture: Urinalysis with trace LE, positive nitrites, many bacteria, WBC 6-30. Urine cx 11/21 >100k e.coli pansensitive. - Levaquin (11/21>>11/27)  # Mild AKI: Resolved Cr 1.41 (from 1.11) (baseline Cr 1.1-1.2) - Continue to monitor - continue to hold Lasix - Consider restarting tomorrow   # Chronic diastolic CHF/CABG (2005)/CAD/PAD/aortic valve sclerosis/mitral regurgitation/hypertension: CHF last EF of 55%- 60% - Holding Lasix given elevation in creatinine, consider restarting pending creatinine. Consider every-other-day dosing - Aspirin 81 mg daily - Lipitor - Home nitroglycerin tablet when necessary for chest pain  # Type 2 diabetes: Patient on injectable insulin at home. CBGs 120-213 - Lantus 10 units daily (home dose 15 units daily) - Sliding-scale insulin sensitive  FEN/GI: IV fluids KVO, heart healthy/carb modified diet; Protonix PO Prophylaxis: Lovenox  Disposition: pending clinical improvement; SNF (SW on board)  Subjective:  - overnight had cute worsening respiratory status requiring BiPAP. BiPap continued overnight.  - PCCM: rec IV steroids x 3, scheduled nebs, f/u CXR: IV Solumedrol  q 6 hr; Albuterol neb q4 and q 2hr PRN - EKG showed Afib with RVR: Metoprolol IV  q 4 PRN (x 2 ON) - no concerns this morning from patient. States her breathing is okay today.   Objective: Temp:  [97.5 F (  36.4 C)-98 F (36.7 C)] 98 F (36.7 C) (11/28 0411) Pulse Rate:  [97-149] 97 (11/28 0427) Resp:  [15-38] 22 (11/28 0605) BP: (95-175)/(68-95) 121/68 mmHg (11/28 0411) SpO2:  [98 %-100 %] 99 % (11/28 0605) FiO2 (%):  [35 %-50 %] 35 % (11/28 0427) Weight:  [183 lb 8 oz (83.235  kg)-184 lb 8.4 oz (83.7 kg)] 183 lb 8 oz (83.235 kg) (11/28 0411) Physical Exam: General -- 81yo female resting comfortably in no apparent distress, flat affect. Chest --  Minimal crackles at the bases; normal effort at rest, on 2L [place] with good saturations  Cardiac -- irregularly irregular, tachycardic. No murmur appreciated Abdomen -- soft, nontender. No masses palpable. Bowel sounds present.. Extremities - no tenderness or effusions noted. Pulses present bilaterally. No edema.  Laboratory:  Recent Labs Lab 03/19/15 0304 03/20/15 0348 03/25/15 0515  WBC 9.2 8.3 21.2*  HGB 9.3* 9.4* 10.6*  HCT 31.3* 30.9* 33.4*  PLT 170 226 291    Recent Labs Lab 03/23/15 0236 03/24/15 0955 03/25/15 0515  NA 136 134* 134*  K 4.2 3.8 4.8  CL 94* 93* 94*  CO2 36* 33* 33*  BUN 48* 38* 41*  CREATININE 1.37* 1.11* 1.41*  CALCIUM 10.1 9.8 9.5  GLUCOSE 206* 136* 213*   Urinalysis    Component Value Date/Time   COLORURINE AMBER* 03/18/2015 1815   APPEARANCEUR CLOUDY* 03/18/2015 1815   LABSPEC 1.016 03/18/2015 1815   PHURINE 5.0 03/18/2015 1815   GLUCOSEU NEGATIVE 03/18/2015 1815   HGBUR NEGATIVE 03/18/2015 1815   BILIRUBINUR NEGATIVE 03/18/2015 1815   KETONESUR NEGATIVE 03/18/2015 1815   PROTEINUR NEGATIVE 03/18/2015 1815   UROBILINOGEN 1.0 11/18/2014 1541   NITRITE POSITIVE* 03/18/2015 1815   LEUKOCYTESUR TRACE* 03/18/2015 1815  - BNP 153.9 - Troponin 0.06>0.04  CXR: IMPRESSION: Developing basilar opacities bilaterally, due to effusions or airspace consolidation or both.  Palma HolterKanishka G Reniya Mcclees, MD 03/25/2015, 6:12 AM PGY-1, [facility] FPTS Intern pager: [phone], text pages welcome

## 2015-03-26 LAB — BASIC METABOLIC PANEL
ANION GAP: 7 (ref 5–15)
BUN: 37 mg/dL — ABNORMAL HIGH (ref 6–20)
CALCIUM: 9.5 mg/dL (ref 8.9–10.3)
CO2: 32 mmol/L (ref 22–32)
Chloride: 97 mmol/L — ABNORMAL LOW (ref 101–111)
Creatinine, Ser: 1.1 mg/dL — ABNORMAL HIGH (ref 0.44–1.00)
GFR, EST AFRICAN AMERICAN: 52 mL/min — AB (ref >60)
GFR, EST NON AFRICAN AMERICAN: 45 mL/min — AB (ref >60)
GLUCOSE: 168 mg/dL — AB (ref 65–99)
Potassium: 4.6 mmol/L (ref 3.5–5.1)
SODIUM: 136 mmol/L (ref 135–145)

## 2015-03-26 LAB — GLUCOSE, CAPILLARY
Glucose-Capillary: 130 mg/dL — ABNORMAL HIGH (ref 65–99)
Glucose-Capillary: 147 mg/dL — ABNORMAL HIGH (ref 65–99)
Glucose-Capillary: 163 mg/dL — ABNORMAL HIGH (ref 65–99)
Glucose-Capillary: 179 mg/dL — ABNORMAL HIGH (ref 65–99)

## 2015-03-26 MED ORDER — DILTIAZEM HCL ER COATED BEADS 240 MG PO CP24: 240.0000 mg | ORAL_CAPSULE | Freq: Every day | ORAL | Status: DC

## 2015-03-26 MED ORDER — PRO-STAT SUGAR FREE PO LIQD: 30.0000 mL | Freq: Every day | ORAL | Status: AC

## 2015-03-26 MED ORDER — FUROSEMIDE 40 MG PO TABS
40.0000 mg | ORAL_TABLET | Freq: Every day | ORAL | Status: DC
  Administered 2015-03-26: 40 mg via ORAL
  Filled 2015-03-26: qty 1

## 2015-03-26 MED ORDER — INSULIN GLARGINE 100 UNIT/ML ~~LOC~~ SOLN: 10.0000 [IU] | Freq: Every day | SUBCUTANEOUS | Status: DC

## 2015-03-26 MED ORDER — ENSURE ENLIVE PO LIQD: 237.0000 mL | ORAL | Status: DC

## 2015-03-26 MED ORDER — PREDNISONE 50 MG PO TABS: ORAL_TABLET | ORAL | Status: DC

## 2015-03-26 MED ORDER — FUROSEMIDE 40 MG PO TABS
40.0000 mg | ORAL_TABLET | ORAL | Status: DC
Start: 2015-03-26 — End: 2015-03-27

## 2015-03-26 MED ORDER — PREDNISONE 5 MG PO TABS: ORAL_TABLET | ORAL | Status: DC

## 2015-03-26 MED ORDER — GUAIFENESIN ER 600 MG PO TB12: 600.0000 mg | ORAL_TABLET | Freq: Two times a day (BID) | ORAL | Status: DC | PRN

## 2015-03-26 MED ORDER — METOPROLOL SUCCINATE ER 25 MG PO TB24
25.0000 mg | ORAL_TABLET | Freq: Every day | ORAL | Status: DC
  Administered 2015-03-26 – 2015-03-27 (×2): 25 mg via ORAL
  Filled 2015-03-26 (×2): qty 1

## 2015-03-26 MED ORDER — FUROSEMIDE 40 MG PO TABS: 40.0000 mg | ORAL_TABLET | ORAL | Status: DC

## 2015-03-26 NOTE — Progress Notes (Signed)
Physical Therapy Treatment Patient Details Name: Karen Lynntta S Storlie MRN: [id number] DOB: 12/28/1929 Today's Date: 03/26/2015    History of Present Illness [name] is a 81 y.o. female presenting with dyspnea and cough. PMH is significant for COPD (on home oxygen, 2 L), A. fib with RVR, chronic diastolic CHF, s/p CABG, CAD, PAD, aortic valve sclerosis, mitral regurgitation, hypertension, hyperlipidemia, DM2, aortic aneurysm, and tobacco abuse.  Admitted with COPD exacerbation and a-fib w/ RVR.    PT Comments    Pt continues to struggle with mobility due to dyspnea and ?anxiety.   Follow Up Recommendations  SNF (pt may refuse.)     Equipment Recommendations  None recommended by PT    Recommendations for Other Services       Precautions / Restrictions Precautions Precautions: Fall    Mobility  Bed Mobility                  Transfers     Transfers: Sit to/from Stand Sit to Stand: Min guard         General transfer comment: guard due to severe dyspnea with any movements  Ambulation/Gait Ambulation/Gait assistance: Min guard Ambulation Distance (Feet): 50 Feet (total with 2 sitting rests 15'x2, and 20' x1) Assistive device: Rolling walker (2 wheeled) Gait Pattern/deviations: Step-through pattern Gait velocity: slow   General Gait Details: generally steady, quickly fatigues, becomes dyspneic and anxious, but sats generally between 92-97 except 89% after 3rd trial.  EHR fluctuates between 116-125 and down to 90's after 1-2 min   Stairs            Wheelchair Mobility    Modified Rankin (Stroke Patients Only)       Balance Overall balance assessment: Needs assistance Sitting-balance support: No upper extremity supported Sitting balance-Leahy Scale: Good       Standing balance-Leahy Scale: Fair                      Cognition   Behavior During Therapy: Anxious Overall Cognitive Status: Within Functional Limits for tasks assessed                       Exercises      General Comments        Pertinent Vitals/Pain Pain Assessment: No/denies pain    Home Living                      Prior Function            PT Goals (current goals can now be found in the care plan section) Acute Rehab PT Goals Patient Stated Goal: to be able to breath better PT Goal Formulation: With patient Time For Goal Achievement: 04/02/15 Potential to Achieve Goals: Good Progress towards PT goals: Progressing toward goals    Frequency  Min 3X/week    PT Plan Current plan remains appropriate    Co-evaluation             End of Session Equipment Utilized During Treatment: Oxygen Activity Tolerance: Patient limited by fatigue Patient left: with call bell/phone within reach;in chair;with family/visitor present     Time: [id number] PT Time Calculation (min) (ACUTE ONLY): 24 min  Charges:  $Gait Training: 8-22 mins $Therapeutic Activity: 8-22 mins                    G Codes:      Dangela How, Eliseo GumKenneth V 03/26/2015, 4:44 PM 03/26/2015  Donnella Sham, PT [phone]  (pager)

## 2015-03-26 NOTE — Care Management Important Message (Signed)
Important Message  Patient Details  Name: [name] MRN: [id number] Date of Birth: 1929/10/01   Medicare Important Message Given:  Yes    Kyla BalzarineShealy, Brynlee Pennywell Abena 03/26/2015, 12:22 PM

## 2015-03-26 NOTE — Progress Notes (Signed)
Family Medicine Teaching Service Daily Progress Note Intern Pager: [phone]  Patient name: [name] Medical record number: [id number] Date of birth: 10-04-1929 Age: [age] y.o. Gender: female  Primary Care Provider: [name],[name], MD Consultants: none Code Status: FULL  Pt Overview and Major Events to Date:  11/21: Admitted for dyspnea likely due to COPD exacerbation 11/22: Increased Cardizem dose; discontinued Toprol XL per cards 11/24: HR controlled at rest 11/27: Had episode of dyspnea overnight requiring BiPaP and also Afib with RVR, now on 2L [place] and HR improved with PRN Metoprolol  11/28: restarted Metoprolol BID for better HR control  11/29 switched to Metoprolol XL   Assessment and Plan: 81 y.o. female who presented with dyspnea and cough found to be in Afib with RVR. Now being treated for COPD exacerbation. PMH is significant for COPD (on home oxygen, 2 L), A. fib with RVR (not on anticoad 2/2 hx GI bleed 2/2 AVMs), chronic diastolic CHF, s/p CABG, CAD, PAD, aortic valve sclerosis, mitral regurgitation, hypertension, hyperlipidemia, DM2, aortic aneurysm, and tobacco abuse.  # Dyspnea, secondary to COPD exacerbation: Gold D COPD; On 2 L of oxygen at home. At acute episode overnight requiring BiPaP but is on 2L [place] this AM with good saturations.  Repeat ABG: pH 7.4, CO2 50.5, bicarb 32.3 (slightly increased CO2 from prior).  - Repeat CXR 11/28: Developing basilar opacities bilaterally, due to effusions or airspace consolidation or both.  - On 2L O2 overnight; 3L for a few hours during the day 11/28 - 5 day course Prednisone 50 mg daily completed 03/23/15; started IV solumedrol 11/27; start taper 11/29: PO 50mg  x3 days, then 25mg  x 3 days, then 10mg  x 3 days, then 5mg  x 3 days  - Levaquin per pharmacy (11/21>>11/27).  - Continue home Spiriva, Symbicort, albuterol nebulizer PRN - Incentive Spirometry  - Social Work for SNF placement  # A. fib with RVR, RVR resolved: HR  86-114  Patient is not currently on anticoagulation due to previous GI bleed due to AVM (CHADS2 4). Patient is [facility]  - On telemetry - Cardiology consulted, appreciate recs: signed off  - Cardizem to 240mg  daily (increased from home dose) - Metoprolol XL 25mg   # Mild AKI: Resolved Cr (1.10) from 1.41  (baseline Cr 1.1-1.2) - Continue to monitor - restarted home Lasix today   # E. Coli positive urine culture, treated: Urinalysis with trace LE, positive nitrites, many bacteria, WBC 6-30. Urine cx 11/21 >100k e.coli pansensitive. - Levaquin (11/21>>11/27)  # Chronic diastolic CHF/CABG (2005)/CAD/PAD/aortic valve sclerosis/mitral regurgitation/hypertension: CHF last EF of 55%- 60% - Holding Lasix given elevation in creatinine, consider restarting pending creatinine. Consider every-other-day dosing - Aspirin 81 mg daily - Lipitor - Home nitroglycerin tablet when necessary for chest pain  # Type 2 diabetes: Patient on injectable insulin at home. CBGs 168-216 - Lantus 10 units daily (home dose 15 units daily) - Sliding-scale insulin sensitive  FEN/GI: IV fluids KVO, heart healthy/carb modified diet; Protonix PO Prophylaxis: Lovenox  Disposition: possibly to SNF today (SW on board)  Subjective:  - per nursing note overnight HR 110-140s, RR 20-30s, patient short of breath. Oxygen increased to 3L but decreased back down to 2L  - patient doing okay today; states her breathing has not changed since admission, but denies feeling short of breath  - denies fevers, chills, chest pain   Objective: Temp:  [97.5 F (36.4 C)-98.1 F (36.7 C)] 98 F (36.7 C) (11/29 0306) Pulse  Rate:  [86-146] 86 (11/29 0306) Resp:  [13-30] 13 (11/29 0306) BP: (100-129)/(56-76) 100/69 mmHg (11/29 0306) SpO2:  [90 %-99 %] 98 % (11/29 0306) Weight:  [180 lb 14.4 oz (82.056 kg)] 180 lb 14.4 oz (82.056 kg) (11/29 0455) Physical Exam: General -- 81yo female resting  comfortably in no apparent distress, flat affect. Chest --  CTAB; normal effort at rest, on 2L [place] with good saturations  Cardiac -- irregularly irregular, tachycardic. No murmur appreciated Abdomen -- soft, nontender. No masses palpable. Bowel sounds present.. Extremities - no tenderness or effusions noted. Pulses present bilaterally. No edema.  Laboratory:  Recent Labs Lab 03/20/15 0348 03/25/15 0515  WBC 8.3 21.2*  HGB 9.4* 10.6*  HCT 30.9* 33.4*  PLT 226 291    Recent Labs Lab 03/24/15 0955 03/25/15 0515 03/26/15 0450  NA 134* 134* 136  K 3.8 4.8 4.6  CL 93* 94* 97*  CO2 33* 33* 32  BUN 38* 41* 37*  CREATININE 1.11* 1.41* 1.10*  CALCIUM 9.8 9.5 9.5  GLUCOSE 136* 213* 168*   Urinalysis    Component Value Date/Time   COLORURINE AMBER* 03/18/2015 1815   APPEARANCEUR CLOUDY* 03/18/2015 1815   LABSPEC 1.016 03/18/2015 1815   PHURINE 5.0 03/18/2015 1815   GLUCOSEU NEGATIVE 03/18/2015 1815   HGBUR NEGATIVE 03/18/2015 1815   BILIRUBINUR NEGATIVE 03/18/2015 1815   KETONESUR NEGATIVE 03/18/2015 1815   PROTEINUR NEGATIVE 03/18/2015 1815   UROBILINOGEN 1.0 11/18/2014 1541   NITRITE POSITIVE* 03/18/2015 1815   LEUKOCYTESUR TRACE* 03/18/2015 1815  - BNP 153.9 - Troponin 0.06>0.04  CXR: IMPRESSION: Developing basilar opacities bilaterally, due to effusions or airspace consolidation or both.  Palma Holter, MD 03/26/2015, 6:34 AM PGY-1, [place] [facility] FPTS Intern pager: [phone], text pages welcome

## 2015-03-26 NOTE — Progress Notes (Signed)
Admission note:  Arrival Method: wheelchair  Mental Orientation: alert & oriented x 4  Telemetry: box #26 applied and CCMD notified  Assessment: completed  Skin: skin tear to left elbow; ecchymosis on bilateral upper extremities  IV: right FA saline locked  Pain: pt denies  Tubes: N/A  Safety Measures: Patient Handbook has been given, and discussed the Fall Prevention worksheet. Left at bedside   6E Orientation: Patient has been oriented to the unit, staff and to the room.  Family: At the bedside-daughter   [facility] Giann Obara BSN, RN [facility] Phone [phone]

## 2015-03-26 NOTE — Progress Notes (Addendum)
CM spoke with pt regarding discharge to SNF. Pt refuses to be d/c to SNF. Pt states she going home to her family. CM  got approval from CM to speak  with son(Michael @ [phone]) and grandaugter [name]([name] @ [phone]  ) whom both confirmed plan is for pt to d/c to home. Grandaughter states she lives with pt and 24/7 supervision will be provided for pt once d/c. CM to f/u with d/c needs. Gae Gallopngela Aparna Vanderweele RN,BSN,CM [phone]

## 2015-03-27 LAB — BASIC METABOLIC PANEL
ANION GAP: 6 (ref 5–15)
BUN: 40 mg/dL — ABNORMAL HIGH (ref 6–20)
CHLORIDE: 92 mmol/L — AB (ref 101–111)
CO2: 38 mmol/L — AB (ref 22–32)
Calcium: 9.5 mg/dL (ref 8.9–10.3)
Creatinine, Ser: 1.13 mg/dL — ABNORMAL HIGH (ref 0.44–1.00)
GFR calc Af Amer: 51 mL/min — ABNORMAL LOW (ref >60)
GFR calc non Af Amer: 44 mL/min — ABNORMAL LOW (ref >60)
GLUCOSE: 125 mg/dL — AB (ref 65–99)
POTASSIUM: 3.8 mmol/L (ref 3.5–5.1)
Sodium: 136 mmol/L (ref 135–145)

## 2015-03-27 LAB — GLUCOSE, CAPILLARY
Glucose-Capillary: 115 mg/dL — ABNORMAL HIGH (ref 65–99)
Glucose-Capillary: 153 mg/dL — ABNORMAL HIGH (ref 65–99)

## 2015-03-27 MED ORDER — POTASSIUM CHLORIDE CRYS ER 20 MEQ PO TBCR
20.0000 meq | EXTENDED_RELEASE_TABLET | Freq: Every day | ORAL | Status: DC
  Administered 2015-03-27: 20 meq via ORAL
  Filled 2015-03-27: qty 1

## 2015-03-27 MED ORDER — FUROSEMIDE 40 MG PO TABS: 40.0000 mg | ORAL_TABLET | Freq: Every day | ORAL | Status: DC

## 2015-03-27 MED ORDER — PREDNISONE 50 MG PO TABS: ORAL_TABLET | ORAL | Status: AC

## 2015-03-27 MED ORDER — POTASSIUM CHLORIDE CRYS ER 20 MEQ PO TBCR: 20.0000 meq | EXTENDED_RELEASE_TABLET | Freq: Every day | ORAL | Status: DC

## 2015-03-27 MED ORDER — DILTIAZEM HCL ER COATED BEADS 240 MG PO CP24: 240.0000 mg | ORAL_CAPSULE | Freq: Every day | ORAL | Status: DC

## 2015-03-27 MED ORDER — PREDNISONE 5 MG PO TABS: ORAL_TABLET | ORAL | Status: DC

## 2015-03-27 MED ORDER — FUROSEMIDE 40 MG PO TABS
40.0000 mg | ORAL_TABLET | Freq: Every day | ORAL | Status: DC
  Administered 2015-03-27: 40 mg via ORAL
  Filled 2015-03-27 (×2): qty 1

## 2015-03-27 NOTE — Care Management Note (Signed)
Case Management Note  Patient Details  Name: [name] MRN: [id number] Date of Birth: Sep 07, 1929  Subjective/Objective:     CM following for progression and d/c planning.               Action/Plan: Met with pt re HH needs, pt somewhat slow to make decisions, she gave permission for this CM to call her family re her d/c need. This CM spoke with granddaughter who will be providing care at home. She stated that the pt had been active with [place] and they wish to continue with that agency for [facility] needs. The pt has walkers, wheelchair and commode seat at home no further DME needs per granddaughter.  [facility] notified of plan to d/c this pt today.   Expected Discharge Date:                  Expected Discharge Plan:  [place]  In-House Referral:     Discharge [place]  Post Acute Care Choice:  Resumption of Svcs/PTA Provider Choice offered to:  Adult Children  DME Arranged:  N/A DME Agency:  [facility] (Pt is active with [facility], oxygen)  HH Arranged:  RN, PT HH Agency:  [place]  Status of Service:  Completed, signed off  Medicare Important Message Given:  Yes Date Medicare IM Given:    Medicare IM give by:    Date Additional Medicare IM Given:    Additional Medicare Important Message give by:     If discussed at [place] of Stay Meetings, dates discussed:    Additional Comments:  Adron Bene, RN 03/27/2015, 11:33 AM

## 2015-03-27 NOTE — Discharge Instructions (Signed)
You were hospitalized for shortness of breath. This was thought to be due to a flare of your COPD. Additionally, you heart rate was significantly elevated when you were first admitted which also contributed to your shortness of breath.  - you completed a course of antibiotics (which is usually done when patients come in with a flare of their COPD) - you will go home on a course of steroid taper that was started while you were in the hospital. You will take Prednisone 50mg  daily on 03/28/15, then Prednisone 25mg  daily for the next 3 days, then Prednisone  10mg  daily for the next  3 days, then Prednisone 5mg  daily for the next  3 days  - Continue to take your Lasix daily. We decreased Kdur (potassium tablet) to 20mEq daily (instead of twice a day) - We increased the dose of your Cardizem medication for your Atrial Fibrillation. - please make an appointment with your primary care doctor in 1-2 weeks - please make an appointment with your pulmonologist and cardiologist soon

## 2015-03-27 NOTE — Progress Notes (Signed)
Occupational Therapy Treatment Patient Details Name: TILDA SAMUDIO MRN: [id number] DOB: Oct 13, 1929 Today's Date: 03/27/2015    History of present illness PEARLA MCKINNY is a 81 y.o. female presenting with dyspnea and cough. PMH is significant for COPD (on home oxygen, 2 L), A. fib with RVR, chronic diastolic CHF, s/p CABG, CAD, PAD, aortic valve sclerosis, mitral regurgitation, hypertension, hyperlipidemia, DM2, aortic aneurysm, and tobacco abuse.  Admitted with COPD exacerbation and a-fib w/ RVR.   OT comments  Pt. Actively participated in skilled OT session today.  Focus on hand placement and sequencing during stand pivot transfers to/from bsc.  Will continue to follow acutely.  Follow Up Recommendations  SNF    Equipment Recommendations  None recommended by OT    Recommendations for Other Services      Precautions / Restrictions Precautions Precautions: Fall       Mobility Bed Mobility               General bed mobility comments: in recliner upon arrival  Transfers Overall transfer level: Needs assistance   Transfers: Sit to/from Stand;Stand Pivot Transfers Sit to Stand: Min guard Stand pivot transfers: Min guard       General transfer comment: cues for hand placement    Balance                                   ADL Overall ADL's : Needs assistance/impaired                     Lower Body Dressing: Supervision/safety;Sitting/lateral leans Lower Body Dressing Details (indicate cue type and reason): able to reach B les for don/doff socks and place lower extremity garments over legs Toilet Transfer: Min guard;Cueing for sequencing;Stand-pivot;BSC Toilet Transfer Details (indicate cue type and reason): pt. required cues to reach for arm rest of bsc properly as she was attempting to grab each arm rest with b ues.  educated that this would trap her but if she reaches across it starts a pivot naturally, pt. able to return demo and get to  bsc Toileting- Architect and Hygiene: Min guard;Sit to/from stand       Functional mobility during ADLs: Min guard General ADL Comments: educated on safe sequencing and hand placement for stand pivot transfers to/from bsc      Vision                     Perception     Praxis      Cognition   Behavior During Therapy: [facility] for tasks assessed/performed Overall Cognitive Status: Within Functional Limits for tasks assessed                       Extremity/Trunk Assessment               Exercises     Shoulder Instructions       General Comments      Pertinent Vitals/ Pain       Pain Assessment: No/denies pain  Home Living                                          Prior Functioning/Environment              Frequency Min 2X/week  Progress Toward Goals  OT Goals(current goals can now be found in the care plan section)  Progress towards OT goals: Progressing toward goals     Plan Discharge plan remains appropriate    Co-evaluation                 End of Session Equipment Utilized During Treatment: Gait belt   Activity Tolerance Patient tolerated treatment well   Patient Left in chair;with call bell/phone within reach   Nurse Communication          Time: [id number] OT Time Calculation (min): 11 min  Charges: OT General Charges $OT Visit: 1 Procedure OT Treatments $Self Care/Home Management : 8-22 mins  Robet LeuMorris, Tan Clopper Lorraine, COTA/L 03/27/2015, 12:36 PM

## 2015-03-27 NOTE — Progress Notes (Signed)
Family Medicine Teaching Service Daily Progress Note Intern Pager: [phone]  Patient name: [name] Medical record number: [id number] Date of birth: 09-Aug-1929 Age: 81 y.o. Gender: female  Primary Care Provider: Kaleen Mask, MD Consultants: none Code Status: FULL  Pt Overview and Major Events to Date:  11/21: Admitted for dyspnea likely due to COPD exacerbation 11/22: Increased Cardizem dose; discontinued Toprol XL per cards 11/24: HR controlled at rest 11/27: Had episode of dyspnea overnight requiring BiPaP and also Afib with RVR, now on 2L [place] and HR improved with PRN Metoprolol  11/28: restarted Metoprolol BID for better HR control  11/29 switched to Metoprolol XL   Assessment and Plan: 81 y.o. female who presented with dyspnea and cough found to be in Afib with RVR. Now being treated for COPD exacerbation. PMH is significant for COPD (on home oxygen, 2 L), A. fib with RVR (not on anticoad 2/2 hx GI bleed 2/2 AVMs), chronic diastolic CHF, s/p CABG, CAD, PAD, aortic valve sclerosis, mitral regurgitation, hypertension, hyperlipidemia, DM2, aortic aneurysm, and tobacco abuse.  # Dyspnea secondary to COPD exacerbation, Improved: Gold D COPD; On 2 L of oxygen at home.  - On 2L O2 overnight; 3L this AM with 100% saturation, decreased down to home oxygen - 5 day course Prednisone 50 mg daily completed 03/23/15; started IV solumedrol 11/27; started taper 11/29: PO  x3 days, then  x 3 days, then  x 3 days, then  x 3 days  - Levaquin per pharmacy (11/21>>11/27)  - Continue home Spiriva, Symbicort, albuterol nebulizer PRN - Incentive Spirometry   # A. fib with RVR, RVR resolved: HR mainly 80s-90s, with one 141 noted in chart.  Patient is not currently on anticoagulation due to previous GI bleed due to AVM (CHADS2 4). Patient is followed by Dr. [facility]jeana Brock at [facility].  - On telemetry - Cardiology consulted, appreciate recs: signed off  - Cardizem to   daily - Metoprolol XL   # Chronic diastolic CHF/CABG (2005)/CAD/PAD/aortic valve sclerosis/mitral regurgitation/hypertension: CHF last EF of 55%- 60% - Lasix daily  - will start Kdur daily (half home dose) - Aspirin 81 mg daily - Lipitor - Home nitroglycerin tablet when necessary for chest pain  # Mild AKI: Resolved Cr 1.13 < 1.10  (baseline Cr 1.1-1.2) - Continue to monitor - restarted home Lasix 11/29    # E. Coli positive urine culture, treated: Urinalysis with trace LE, positive nitrites, many bacteria, WBC 6-30. Urine cx 11/21 >100k e.coli pansensitive. - Levaquin (11/21>>11/27)  # Type 2 diabetes: Patient on injectable insulin at home. CBGs 125-179. - Lantus 10 units daily (home dose 15 units daily) - Sliding-scale insulin sensitive  FEN/GI: IV fluids KVO, heart healthy/carb modified diet; Protonix PO Prophylaxis: Lovenox SQ  Disposition: likely home today with HHPT/RN  Subjective:  - no concerns today; states she thinks SNF is easier for family but she would like to go home - notes of shortness of breath that "comes and goes" when "moving around". States she is unsure if this is chronic for her - denies fevers, chills, chest pain, orthopnea  Objective: Temp:  [97.8 F (36.6 C)-98.4 F (36.9 C)] 98.4 F (36.9 C) (11/30 0459) Pulse Rate:  [80-99] 99 (11/30 0459) Resp:  [18-21] 21 (11/30 0459) BP: (119-135)/(64-76) 135/66 mmHg (11/30 0459) SpO2:  [90 %-100 %] 100 % (11/30 0459) Weight:  [180 lb 5.4 oz (81.8 kg)] 180 lb 5.4 oz (81.8 kg) (11/29 2029) Physical Exam: General -- 81yo female resting  comfortably in no apparent distress, flat affect. Chest --  CTAB; normal effort at rest, on 3L [place] this AM with 100% saturation (decreased to 2L- home oxygen) Cardiac -- irregularly irregular, tachycardic. No murmur appreciated Abdomen -- soft, nontender. No masses palpable. Bowel sounds present.. Extremities - no tenderness or effusions noted. Pulses present  bilaterally. Pitting edema in LE more prominent this AM (Lasix restarted 11/30; held due to AKI)  Laboratory:  Recent Labs Lab 03/25/15 0515  WBC 21.2*  HGB 10.6*  HCT 33.4*  PLT 291    Recent Labs Lab 03/25/15 0515 03/26/15 0450 03/27/15 0539  NA 134* 136 136  K 4.8 4.6 3.8  CL 94* 97* 92*  CO2 33* 32 38*  BUN 41* 37* 40*  CREATININE 1.41* 1.10* 1.13*  CALCIUM 9.5 9.5 9.5  GLUCOSE 213* 168* 125*   Urinalysis    Component Value Date/Time   COLORURINE AMBER* 03/18/2015 1815   APPEARANCEUR CLOUDY* 03/18/2015 1815   LABSPEC 1.016 03/18/2015 1815   PHURINE 5.0 03/18/2015 1815   GLUCOSEU NEGATIVE 03/18/2015 1815   HGBUR NEGATIVE 03/18/2015 1815   BILIRUBINUR NEGATIVE 03/18/2015 1815   KETONESUR NEGATIVE 03/18/2015 1815   PROTEINUR NEGATIVE 03/18/2015 1815   UROBILINOGEN 1.0 11/18/2014 1541   NITRITE POSITIVE* 03/18/2015 1815   LEUKOCYTESUR TRACE* 03/18/2015 1815  - BNP 153.9 - Troponin 0.06>0.04 > 0.04  CXR 11/28: IMPRESSION: Developing basilar opacities bilaterally, due to effusions or airspace consolidation or both.  Palma HolterKanishka G Caleigha Zale, MD 03/27/2015, 9:00 AM PGY-1, [facility] FPTS Intern pager: [phone], text pages welcome

## 2015-03-27 NOTE — Progress Notes (Signed)
Physical Therapy Treatment Patient Details Name: [name] MRN: [id number] DOB: 04-Dec-1929 Today's Date: 03/27/2015    History of Present Illness [name] is a 81 y.o. female presenting with dyspnea and cough. PMH is significant for COPD (on home oxygen, 2 L), A. fib with RVR, chronic diastolic CHF, s/p CABG, CAD, PAD, aortic valve sclerosis, mitral regurgitation, hypertension, hyperlipidemia, DM2, aortic aneurysm, and tobacco abuse.  Admitted with COPD exacerbation and a-fib w/ RVR.    PT Comments    Limited treatment today. Pt able to stand with min assist but fatigued quickly and needed to sit. HR 115-120s, SpO2 95% on 2L supplemental O2. Despite a rest break, she declined to attempt to stand again, preventing gait training today. Obviously very deconditioned. Apparently she will be cared for by granddaughter at home.   Follow Up Recommendations  SNF (pt may refuse. - Recommend HHPT)     Equipment Recommendations  None recommended by PT    Recommendations for Other Services       Precautions / Restrictions Precautions Precautions: Fall Restrictions Weight Bearing Restrictions: No    Mobility  Bed Mobility               General bed mobility comments: in recliner  Transfers Overall transfer level: Needs assistance Equipment used: 1 person hand held assist Transfers: Sit to/from Stand Sit to Stand: Min assist Stand pivot transfers: Min guard       General transfer comment: Min assist for boost to stand from recliner. VC for upright posture. Fatigues rapidly and needed to sit before able to ambulate at all. HR in 120s, SpO2 95% on 2L supplemental O2. Cues for safe descent into chair with fair control.  Ambulation/Gait             General Gait Details: Declines to ambulate   Stairs            Wheelchair Mobility    Modified Rankin (Stroke Patients Only)       Balance                                    Cognition  Arousal/Alertness: Awake/alert Behavior During Therapy: Anxious Overall Cognitive Status: Within Functional Limits for tasks assessed                      Exercises      General Comments General comments (skin integrity, edema, etc.): HR 115 at rest with SpO2 95% on 2L supplemental O2. Spent time discussing discharge needs with patient and MD. MD called son.      Pertinent Vitals/Pain Pain Assessment: No/denies pain    Home Living                      Prior Function            PT Goals (current goals can now be found in the care plan section) Acute Rehab PT Goals Patient Stated Goal: to be able to breath better PT Goal Formulation: With patient Time For Goal Achievement: 04/02/15 Potential to Achieve Goals: Good Progress towards PT goals: Progressing toward goals    Frequency  Min 3X/week    PT Plan Current plan remains appropriate    Co-evaluation             End of Session Equipment Utilized During Treatment: Oxygen Activity Tolerance: Patient limited by fatigue Patient  left: with call bell/phone within reach;in chair     Time: 1341-1404 (10 minutes non-billable speaking with Pt, MD, and Son) PT Time Calculation (min) (ACUTE ONLY): 23 min  Charges:  $Therapeutic Activity: 8-22 mins                    G Codes:      Berton Mount 04/19/15, 3:10 PM  [place], [place] [phone]

## 2015-04-01 ENCOUNTER — Encounter: Payer: Self-pay | Admitting: Cardiology

## 2015-04-01 ENCOUNTER — Ambulatory Visit (INDEPENDENT_AMBULATORY_CARE_PROVIDER_SITE_OTHER): Payer: Medicare Other | Admitting: Cardiology

## 2015-04-01 VITALS — BP 126/64 | HR 103 | Ht 72.0 in | Wt 174.6 lb

## 2015-04-01 DIAGNOSIS — J441 Chronic obstructive pulmonary disease with (acute) exacerbation: Secondary | ICD-10-CM | POA: Diagnosis not present

## 2015-04-01 DIAGNOSIS — I4891 Unspecified atrial fibrillation: Secondary | ICD-10-CM

## 2015-04-01 DIAGNOSIS — R9431 Abnormal electrocardiogram [ECG] [EKG]: Secondary | ICD-10-CM

## 2015-04-01 DIAGNOSIS — Z951 Presence of aortocoronary bypass graft: Secondary | ICD-10-CM

## 2015-04-01 DIAGNOSIS — I5032 Chronic diastolic (congestive) heart failure: Secondary | ICD-10-CM | POA: Diagnosis not present

## 2015-04-01 MED ORDER — DIGOXIN 125 MCG PO TABS: 0.1250 mg | ORAL_TABLET | Freq: Every day | ORAL | Status: DC

## 2015-04-01 NOTE — Patient Instructions (Signed)
Medication Instructions:   START TAKING DIGOXIN 0.125 MG ONCE DAILY   Labwork:  IN AROUND 2- 2 1/2 WEEKS TO HAVE A CMET DONE---HAVE THIS DONE PRIOR TO YOUR 3 WEEK FOLLOW-UP APPOINTMENT WITH ONE OF OUR EXTENDERS    Follow-Up:  3 WEEKS WITH AN EXTENDER OR FLEX EXTENDER IN OUR OFFICE---PLEASE HAVE YOUR LAB APPOINTMENT DONE PRIOR TO THIS FOLLOW-UP APPOINTMENT    If you need a refill on your cardiac medications before your next appointment, please call your pharmacy.

## 2015-04-01 NOTE — Progress Notes (Signed)
Patient ID: [name], female   DOB: [birth year], 81 y.o.   MRN: [id number]      Cardiology Office Note   Date:  04/01/2015   ID:  [name], DOB December 12, 1929, MRN [id number]  PCP:  Kaleen Mask, MD  Cardiologist:   Lars Masson, MD, previously Dr. Myrtis Ser  Chief Complaint  Patient presents with  . New Patient (Initial Visit)  . Atrial Fibrillation  . Coronary Artery Disease  . Congestive Heart Failure      History of Present Illness: [name] is a 81 y.o. female who was hospitalized in 11/2014 with sepsis,AMS, severe-copd, elevated LFTs and rapid VR of her chronic a fib.  Pt recovered and has been in SNF.  Now at home her grandson stays with her and family members stay on the property.   With hypotension in hospital her cardizem was changed to 120 every 8 hours.   She is not anticoagulation candidate due to GI bleed in the past.  Cardiac hx with a fib, chronic , CAD with CABG in 2005 with LIMA->LAD, rSVG->diag, seq r SVG -> 1st OM and distal LCX, rSVG->PDA. Last cath  06/2012:  Severe native coronary disease as described.  Severe proximal stenosis of the SVG-D2: Subtotal, 99% occlusion  Successful PTCA/PCI of the possible SVG-D2 with a Veriflex BMS 3.0 mm x 20 mm, postdilated to 3.4 mm  Widely patent SVGs to both the RPDA and sequential to OM1-OM 2  Well-preserved EF by LV gram however there is evidence of significant MR.  Last echo 10/2014  Study Conclusions - Left ventricle: The cavity size was normal. Wall thickness was increased in a pattern of mild LVH. Systolic function was normal. The estimated ejection fraction was in the range of 55% to 60%. Regional wall motion abnormalities cannot be excluded. - Mitral valve: There was mild regurgitation. - Left atrium: The atrium was moderately dilated. - Right atrium: The atrium was mildly dilated. - Pulmonary arteries: PA peak pressure: 35 mm Hg (S).  Pt poor historian and poor general memory.   The memory issues began prior to admit. She denies chest pain and her SOB is at baseline.  No longer on steroids and family checks her glucose daily.  She has not yet followed up with Dr. Jeannetta Nap her PCP.  She was hospitalized on 03/20/2015 with a COPD exacerbation. Cards consulted on 03/19/2015 for atrial fibrillation w/ RVR. She continued to be tachycardic she saw the hospitalization and her Cardizem CD was increased to 240 mg daily. She continues to be tachycardic at home and states that with minimal exertion her heart rate increases up to 140s. She denies any chest pain.   Past Medical History  Diagnosis Date  . CAD (coronary artery disease)     a. s/p CABG x5 (2005) LIMA->LAD, rSVG->diag, seq r SVG -> 1st OM and distal LCX, rSVG->PDA b. s/p BMS-prox SVG-D2 (2014)  . Hypertension   . Hyperlipidemia   . GI bleed 2008    AVMs  . Aneurysm, aortic (HCC)     thoracic aorta, stable at 4.1 cm, chest CT, May, 2012  . Syncope     Nitroglycerin plus a diuretic, April, 2009  . Hyponatremia     Chronic. Felt secondary to SIADH   . COPD (chronic obstructive pulmonary disease) (HCC)     on home oxygen  . Tobacco abuse   . Bradycardia   . Carotid artery disease (HCC)     Hx of endarterectomy. Doppler October,  2011, stable, 0-39% RIC A., 40-59% LICA  . Tuberculosis     Exposures to tuberculosis 1970s, has tested negative by the health Department  . Chronic atrial fibrillation (HCC)     Not felt to be an anticoagulation candidate secondary to hx of GIB/AVM  . Venous stasis of lower extremity     Chronic  . CHF with left ventricular diastolic dysfunction, NYHA class 2 (HCC)     a. EF 55-60%, echo, April, 2013 b. EF 55-60%, mild biatrial enlargement and PASP 42 mmH  . AAA (abdominal aortic aneurysm) (HCC)   . Complication of anesthesia   . Hx of CABG     CABG 2005  . Hematoma     Right groin hematoma, small, post cath, April, 2014  . Mitral regurgitation     Mild, hospital, March, 2014  .  Leg ulcer (HCC)     Ulcerated lesion on the anterior aspect of her left lower leg, June, 2014  . Type 2 diabetes mellitus [facility])     Past Surgical History  Procedure Laterality Date  . Cholecystectomy    . Carotid endartercetomy    . Abdominal hysterectomy    . Coronary artery bypass graft  2005  . Coronary angioplasty with stent placement  07/18/12    severe native coronary artery disease, 99% proximal stenosis of the SVG-D2 status post successful PTCA/PCI with a Veriflex bare-metal stent, widely patent SVGs to both the right PDA sequential OM1 to OM 2, EF 65-70% and 3+ mitral regurgitation  . Left heart catheterization with coronary angiogram N/A 07/18/2012    Procedure: LEFT HEART CATHETERIZATION WITH CORONARY ANGIOGRAM;  Surgeon: Marykay Lex, MD;  Location: [facility] CATH LAB;  Service: Cardiovascular;  Laterality: N/A;  . Percutaneous coronary stent intervention (pci-s)  07/18/2012    Procedure: PERCUTANEOUS CORONARY STENT INTERVENTION (PCI-S);  Surgeon: Marykay Lex, MD;  Location: [facility] CATH LAB;  Service: Cardiovascular;;  . Ercp N/A 11/20/2014    Procedure: ENDOSCOPIC RETROGRADE CHOLANGIOPANCREATOGRAPHY (ERCP);  Surgeon: Meryl Dare, MD;  Location: [facility] ENDOSCOPY;  Service: Endoscopy;  Laterality: N/A;     Current Outpatient Prescriptions  Medication Sig Dispense Refill  . albuterol (PROVENTIL HFA;VENTOLIN HFA) 108 (90 BASE) MCG/ACT inhaler Inhale 2 puffs into the lungs every 4 (four) hours as needed for wheezing or shortness of breath. 1 Inhaler 5  . albuterol (PROVENTIL) (2.5 MG/3ML) 0.083% nebulizer solution Take 2.5 mg by nebulization every 6 (six) hours as needed for wheezing.    Marland Kitchen ALPRAZolam (XANAX) 0.25 MG tablet Take 1 tablet (0.25 mg total) by mouth at bedtime as needed for anxiety. 10 tablet 0  . Amino Acids-Protein Hydrolys (FEEDING SUPPLEMENT, PRO-STAT SUGAR FREE 64,) LIQD Take 30 mLs by mouth daily. 900 mL 0  . aspirin EC 81 MG tablet Take 81 mg by mouth daily.    Marland Kitchen  atorvastatin (LIPITOR) 40 MG tablet Take 0.5 tablets (20 mg total) by mouth daily at 6 PM. 90 tablet 1  . diltiazem (CARDIZEM CD) 240 MG 24 hr capsule Take 1 capsule (240 mg total) by mouth daily. 30 capsule 0  . feeding supplement, ENSURE ENLIVE, (ENSURE ENLIVE) LIQD Take 237 mLs by mouth daily. 237 mL 12  . furosemide (LASIX) 40 MG tablet Take 1 tablet (40 mg total) by mouth daily. 90 tablet 0  . insulin aspart (NOVOLOG) 100 UNIT/ML injection Before each meal 3 times a day, 140-199 - 2 units, 200-250 - 4 units, 251-299 - 6 units,  300-349 - 8 units,  350 or above 10 units. Dispense syringes and needles as needed, Ok to switch to PEN if approved. Substitute to any brand approved. DX DM2, Code E11.65 1 vial 12  . insulin glargine (LANTUS) 100 UNIT/ML injection Inject 0.1 mLs (10 Units total) into the skin at bedtime. 10 mL 11  . magnesium oxide (MAG-OX) 400 MG tablet Take 400 mg by mouth daily.    . metoprolol succinate (TOPROL-XL) 25 MG 24 hr tablet Take 1 tablet (25 mg total) by mouth daily. 90 tablet 3  . nitroGLYCERIN (NITROSTAT) 0.4 MG SL tablet Place 0.4 mg under the tongue every 5 (five) minutes as needed for chest pain.    Marland Kitchen. ondansetron (ZOFRAN) 8 MG tablet Take 1 tablet (8 mg total) by mouth every 8 (eight) hours as needed for nausea or vomiting. 20 tablet 0  . OXYGEN Inhale into the lungs. 2L continously    . pantoprazole (PROTONIX) 40 MG tablet Take 1 tablet (40 mg total) by mouth daily. 90 tablet 3  . polyethylene glycol (MIRALAX / GLYCOLAX) packet Take 17 g by mouth daily as needed for mild constipation or moderate constipation.     . potassium chloride SA (K-DUR,KLOR-CON) 20 MEQ tablet Take 1 tablet (20 mEq total) by mouth daily. 30 tablet 0  . predniSONE (DELTASONE) 5 MG tablet Please take 10mg  for 3 days (starting 12/5), then take 5mg  daily for 3 days 9 tablet 0  . senna (SENOKOT) 8.6 MG TABS tablet Take 8.6 mg by mouth daily as needed for mild constipation or moderate constipation.      Marland Kitchen. SPIRIVA HANDIHALER 18 MCG inhalation capsule INHALE THE CONTENTS OF 1 CAPSULE DAILY 90 capsule 0  . SYMBICORT 160-4.5 MCG/ACT inhaler USE 2 INHALATIONS TWICE A DAY 30.6 g 0   No current facility-administered medications for this visit.    Allergies:   Other; Codeine; Sulfonamide derivatives; Warfarin; Penicillins; and Ranitidine hcl   Social History:  The patient  reports that she quit smoking about 2 years ago. Her smoking use included Cigarettes. She has a 29.5 pack-year smoking history. She does not have any smokeless tobacco history on file. She reports that she does not drink alcohol or use illicit drugs.   Family History:  The patient's family history includes Cancer in her brother and sister; Heart failure in her mother; Stroke in her sister. There is no history of Heart attack or Hypertension.    ROS:  Please see the history of present illness.   Otherwise, review of systems are positive for none.   All other systems are reviewed and negative.    PHYSICAL EXAM: VS:  BP 126/64 mmHg  Pulse 103  Ht 6' (1.829 m)  Wt 174 lb 9.6 oz (79.198 kg)  BMI 23.67 kg/m2 , BMI Body mass index is 23.67 kg/(m^2). GEN: Well nourished, well developed, in no acute distress HEENT: normal Neck: no JVD, carotid bruits, or masses Cardiac:iRRR; no murmurs, rubs, or gallops,no edema  Respiratory:  clear to auscultation bilaterally, normal work of breathing GI: soft, nontender, nondistended, + BS MS: no deformity or atrophy Skin: warm and dry, no rash Neuro:  Strength and sensation are intact Psych: euthymic mood, full affect   EKG:  EKG is not ordered today.  Recent Labs: 11/18/2014: TSH 0.702 11/20/2014: Magnesium 2.4 12/26/2014: ALT 24 03/18/2015: B Natriuretic Peptide 153.9* 03/25/2015: Hemoglobin 10.6*; Platelets 291 03/27/2015: BUN 40*; Creatinine, Ser 1.13*; Potassium 3.8; Sodium 136   Lipid Panel    Component Value Date/Time   CHOL  121 07/19/2012 0256   TRIG 102 07/19/2012 0256    HDL 39* 07/19/2012 0256   CHOLHDL 3.1 07/19/2012 0256   VLDL 20 07/19/2012 0256   LDLCALC 62 07/19/2012 0256   Wt Readings from Last 3 Encounters:  04/01/15 174 lb 9.6 oz (79.198 kg)  03/26/15 180 lb 5.4 oz (81.8 kg)  03/04/15 179 lb 6.4 oz (81.375 kg)      ASSESSMENT AND PLAN:  1. Atrial Fibrillation with RVR - This patients CHA2DS2-VASc Score and unadjusted Ischemic Stroke Rate (% per year) is equal to 11.2 % stroke rate/year from a score of 7. (CHF, HTN, DM, Vascular, Female, Age > 88 x2). Has not been considered a good candidate for anticoagulation due to history of GIB and AVM's. - on Toprol-XL 25mg  daily and Cardizem CD 240mg  daily , HR continues to be in tachycardic range, we'll add digoxin 0.125 mg daily, follow in 3 weeks, check potassium and creatinine prior to these appointments.  2. Elevated Troponin - cyclic troponin values have been 0.04 and 0.06 during the last 2 hospitalization. Her EKG today show negative T waves in inferolateral leads and anterior leads this is new when compared to the prior EKG and possibly related to the A. fib with RVR and demand ischemia. She denies any chest pain, will repeat EKG at the next visit.Marland Kitchen - were similar to these values during her previous hospitalization in 10/2014.  - likely to represent demand ischemia in setting of acute COPD exacerbation and atrial fibrillation with RVR.  3. History of CAD - s/p CABG x5 in 2005, BMS to SVG-D2 in 2014 - continue ASA and statin. - new ECG changes as described above, we will control HR, repeat ECG at the next visit.   4. Chronic Diastolic CHF - EF 55-60% in 10/2014. - continue home Lasix dosage, appears euvolemic  Signed, Lars Masson, MD  04/01/2015 2:30 PM    [facility] [address] [place], [place], [place]  [address] Phone: [phone]; Fax: [phone]

## 2015-04-15 ENCOUNTER — Telehealth: Payer: Self-pay | Admitting: Cardiology

## 2015-04-15 NOTE — Telephone Encounter (Signed)
New message      Calling to see if Dr Delton SeeNelson would sign off on pt to have home health care

## 2015-04-15 NOTE — Telephone Encounter (Signed)
Marchelle FolksAmanda RN for [facility] was calling to ask Dr Delton SeeNelson if she would sign off on orders for the pt to have home health therapy for COPD and Skilled nursing and CNA need for the pt in the home.  Informed Marchelle Folksmanda that she should refer to the pts PCP for orders for Home Health for the pt, for they will be more familiar with the pts COPD and the need for Skilled nursing, and because this is not cardiac related.  Informed Marchelle Folksmanda RN that Dr Delton SeeNelson is in and out of the office so frequently, that it would be hard for her to sign off and advise on any home health orders needed for the pt on a regular basis.  Also advised Marchelle Folksmanda that she should seek out the ordering MD, who ordered [facility], to sign off on and follow.  Marchelle Folksmanda verbalized understanding and agrees with this plan.

## 2015-04-17 ENCOUNTER — Other Ambulatory Visit (INDEPENDENT_AMBULATORY_CARE_PROVIDER_SITE_OTHER): Payer: Medicare Other | Admitting: *Deleted

## 2015-04-17 DIAGNOSIS — J441 Chronic obstructive pulmonary disease with (acute) exacerbation: Secondary | ICD-10-CM | POA: Diagnosis not present

## 2015-04-17 DIAGNOSIS — D500 Iron deficiency anemia secondary to blood loss (chronic): Secondary | ICD-10-CM

## 2015-04-17 DIAGNOSIS — I4891 Unspecified atrial fibrillation: Secondary | ICD-10-CM

## 2015-04-17 DIAGNOSIS — Z794 Long term (current) use of insulin: Secondary | ICD-10-CM

## 2015-04-17 DIAGNOSIS — E785 Hyperlipidemia, unspecified: Secondary | ICD-10-CM

## 2015-04-17 DIAGNOSIS — E119 Type 2 diabetes mellitus without complications: Secondary | ICD-10-CM

## 2015-04-17 LAB — LIPID PANEL
CHOL/HDL RATIO: 2.4 ratio (ref <=5.0)
CHOLESTEROL: 134 mg/dL (ref 125–200)
HDL: 56 mg/dL (ref >=46)
LDL Cholesterol: 53 mg/dL (ref <130)
Triglycerides: 127 mg/dL (ref <150)
VLDL: 25 mg/dL (ref <30)

## 2015-04-17 LAB — COMPREHENSIVE METABOLIC PANEL
ALT: 16 U/L (ref 6–29)
AST: 21 U/L (ref 10–35)
Albumin: 3.5 g/dL — ABNORMAL LOW (ref 3.6–5.1)
Alkaline Phosphatase: 70 U/L (ref 33–130)
BUN: 15 mg/dL (ref 7–25)
CO2: 33 mmol/L — ABNORMAL HIGH (ref 20–31)
Calcium: 10.4 mg/dL (ref 8.6–10.4)
Chloride: 98 mmol/L (ref 98–110)
Creat: 0.99 mg/dL — ABNORMAL HIGH (ref 0.60–0.88)
Glucose, Bld: 92 mg/dL (ref 65–99)
Potassium: 3.8 mmol/L (ref 3.5–5.3)
Sodium: 141 mmol/L (ref 135–146)
Total Bilirubin: 1.2 mg/dL (ref 0.2–1.2)
Total Protein: 6.2 g/dL (ref 6.1–8.1)

## 2015-04-17 LAB — HEMOGLOBIN A1C
HEMOGLOBIN A1C: 5.7 % — AB (ref <5.7)
MEAN PLASMA GLUCOSE: 117 mg/dL — AB (ref <117)

## 2015-04-17 LAB — HEMOGLOBIN: HEMOGLOBIN: 11.6 g/dL — AB (ref 12.0–15.0)

## 2015-04-17 NOTE — Addendum Note (Signed)
Addended by: BOWDEN, ROBIN K on: 04/17/2015 09:46 AM   Modules accepted: Orders  

## 2015-04-17 NOTE — Addendum Note (Signed)
Addended by: Tonita PhoenixBOWDEN, ROBIN K on: 04/17/2015 09:46 AM   Modules accepted: Orders

## 2015-04-23 ENCOUNTER — Ambulatory Visit (INDEPENDENT_AMBULATORY_CARE_PROVIDER_SITE_OTHER): Payer: Medicare Other | Admitting: Cardiology

## 2015-04-23 ENCOUNTER — Encounter: Payer: Self-pay | Admitting: Cardiology

## 2015-04-23 VITALS — BP 110/50 | HR 85 | Ht 72.0 in | Wt 165.0 lb

## 2015-04-23 DIAGNOSIS — I482 Chronic atrial fibrillation, unspecified: Secondary | ICD-10-CM

## 2015-04-23 DIAGNOSIS — I25810 Atherosclerosis of coronary artery bypass graft(s) without angina pectoris: Secondary | ICD-10-CM

## 2015-04-23 DIAGNOSIS — I4891 Unspecified atrial fibrillation: Secondary | ICD-10-CM

## 2015-04-23 DIAGNOSIS — R0602 Shortness of breath: Secondary | ICD-10-CM | POA: Diagnosis not present

## 2015-04-23 NOTE — Progress Notes (Signed)
Cardiology Office Note   Date:  04/23/2015   ID:  [name], DOB Sep 16, 1929, MRN [id number]  PCP:  Karen Mask, MD  Cardiologist:  Dr. Delton [name]    Chief Complaint  Patient presents with  . Atrial Fibrillation    weak, SOB      History of Present Illness: [name] is a 81 y.o. female who presents for a fib follow up with RVR.  Now on dig with improved HR.    She has hx in 11/2014 of sepsis,AMS, severe-copd, elevated LFTs and rapid VR of her chronic a fib. Pt recovered and has been in SNF. Now at home her granddaughter stays with her and family members stay on the property. With hypotension in hospital in August her cardizem was changed to 120 every 8 hours. She is not anticoagulation candidate due to GI bleed in the past. Cardiac hx with a fib, chronic , CAD with CABG in 2005 with LIMA->LAD, rSVG->diag, seq r SVG -> 1st OM and distal LCX, rSVG->PDA. Last cath 06/2012:  Severe native coronary disease as described.  Severe proximal stenosis of the SVG-D2: Subtotal, 99% occlusion  Successful PTCA/PCI of the possible SVG-D2 with a Veriflex BMS 3.0 mm x 20 mm, postdilated to 3.4 mm  Widely patent SVGs to both the RPDA and sequential to OM1-OM 2  Well-preserved EF by LV gram however there is evidence of significant MR.  Last echo 10/2014  Study Conclusions - Left ventricle: The cavity size was normal. Wall thickness was increased in a pattern of mild LVH. Systolic function was normal. The estimated ejection fraction was in the range of 55% to 60%. Regional wall motion abnormalities cannot be excluded. - Mitral valve: There was mild regurgitation. - Left atrium: The atrium was moderately dilated. - Right atrium: The atrium was mildly dilated. - Pulmonary arteries: PA peak pressure: 35 mm Hg (S).  She was hospitalized on 03/20/2015 with a COPD exacerbation. Cards consulted on 03/19/2015 for atrial fibrillation w/ RVR. She continued to be tachycardic  she saw the hospitalization and her Cardizem CD was increased to 240 mg daily  On last visit with Dr. Delton [name] 04/01/15 her HR was elevated up to 140.  Dig was added and is back today for follow up.  Labs were done and stable.  Today very weak and her wt is down 15 lbs since  03/26/15.  She has no appetite and she does take 2 boosts per day.  She eats BK but no lunch and very little dinner.  She has no chest pain, her SOB is stable, she is off steroids.  Does not have pul. appt until Feb.   At discharge PT was recommended for her weakness but not yet started.  Dr. Delton [name] would like Dr. Lillia [name] to order.    Past Medical History  Diagnosis Date  . CAD (coronary artery disease)     a. s/p CABG x5 (2005) LIMA->LAD, rSVG->diag, seq r SVG -> 1st OM and distal LCX, rSVG->PDA b. s/p BMS-prox SVG-D2 (2014)  . Hypertension   . Hyperlipidemia   . GI bleed 2008    AVMs  . Aneurysm, aortic (HCC)     thoracic aorta, stable at 4.1 cm, chest CT, May, 2012  . Syncope     Nitroglycerin plus a diuretic, April, 2009  . Hyponatremia     Chronic. Felt secondary to SIADH   . COPD (chronic obstructive pulmonary disease) (HCC)     on home oxygen  . Tobacco abuse   .  Bradycardia   . Carotid artery disease (HCC)     Hx of endarterectomy. Doppler October, 2011, stable, 0-39% RIC A., 40-59% LICA  . Tuberculosis     Exposures to tuberculosis 1970s, has tested negative by the health Department  . Chronic atrial fibrillation (HCC)     Not felt to be an anticoagulation candidate secondary to hx of GIB/AVM  . Venous stasis of lower extremity     Chronic  . CHF with left ventricular diastolic dysfunction, NYHA class 2 (HCC)     a. EF 55-60%, echo, April, 2013 b. EF 55-60%, mild biatrial enlargement and PASP 42 mmH  . AAA (abdominal aortic aneurysm) (HCC)   . Complication of anesthesia   . Hx of CABG     CABG 2005  . Hematoma     Right groin hematoma, small, post cath, April, 2014  . Mitral regurgitation     Mild,  hospital, March, 2014  . Leg ulcer (HCC)     Ulcerated lesion on the anterior aspect of her left lower leg, June, 2014  . Type 2 diabetes mellitus [facility](HCC)     Past Surgical History  Procedure Laterality Date  . Cholecystectomy    . Carotid endartercetomy    . Abdominal hysterectomy    . Coronary artery bypass graft  2005  . Coronary angioplasty with stent placement  07/18/12    severe native coronary artery disease, 99% proximal stenosis of the SVG-D2 status post successful PTCA/PCI with a Veriflex bare-metal stent, widely patent SVGs to both the right PDA sequential OM1 to OM 2, EF 65-70% and 3+ mitral regurgitation  . Left heart catheterization with coronary angiogram N/A 07/18/2012    Procedure: LEFT HEART CATHETERIZATION WITH CORONARY ANGIOGRAM;  Surgeon: Marykay Lexavid W Harding, MD;  [facility];  Service: Cardiovascular;  Laterality: N/A;  . Percutaneous coronary stent intervention (pci-s)  07/18/2012    Procedure: PERCUTANEOUS CORONARY STENT INTERVENTION (PCI-S);  Surgeon: Marykay Lexavid W Harding, MD;  [facility];  Service: Cardiovascular;;  . Ercp N/A 11/20/2014    Procedure: ENDOSCOPIC RETROGRADE CHOLANGIOPANCREATOGRAPHY (ERCP);  Surgeon: Meryl DareMalcolm T Stark, MD;  [facility];  Service: Endoscopy;  Laterality: N/A;     Current Outpatient Prescriptions  Medication Sig Dispense Refill  . albuterol (PROVENTIL HFA;VENTOLIN HFA) 108 (90 BASE) MCG/ACT inhaler Inhale 2 puffs into the lungs every 4 (four) hours as needed for wheezing or shortness of breath. 1 Inhaler 5  . albuterol (PROVENTIL) (2.5 MG/3ML) 0.083% nebulizer solution Take 2.5 mg by nebulization every 6 (six) hours as needed for wheezing.    Marland Kitchen. ALPRAZolam (XANAX) 0.25 MG tablet Take 1 tablet (0.25 mg total) by mouth at bedtime as needed for anxiety. 10 tablet 0  . Amino Acids-Protein Hydrolys (FEEDING SUPPLEMENT, PRO-STAT SUGAR FREE 64,) LIQD Take 30 mLs by mouth daily. 900 mL 0  . aspirin EC 81 MG tablet Take 81 mg  by mouth daily.    Marland Kitchen. atorvastatin (LIPITOR) 40 MG tablet Take 0.5 tablets (20 mg total) by mouth daily at 6 PM. 90 tablet 1  . digoxin (LANOXIN) 0.125 MG tablet Take 1 tablet (0.125 mg total) by mouth daily. 90 tablet 3  . diltiazem (CARDIZEM CD) 240 MG 24 hr capsule Take 1 capsule (240 mg total) by mouth daily. 30 capsule 0  . feeding supplement, ENSURE ENLIVE, (ENSURE ENLIVE) LIQD Take 237 mLs by mouth daily. 237 mL 12  . furosemide (LASIX) 40 MG tablet Take 1 tablet (40 mg total) by mouth  daily. 90 tablet 0  . insulin aspart (NOVOLOG) 100 UNIT/ML injection Before each meal 3 times a day, 140-199 - 2 units, 200-250 - 4 units, 251-299 - 6 units,  300-349 - 8 units,  350 or above 10 units. Dispense syringes and needles as needed, Ok to switch to PEN if approved. Substitute to any brand approved. DX DM2, Code E11.65 1 vial 12  . insulin glargine (LANTUS) 100 UNIT/ML injection Inject 0.1 mLs (10 Units total) into the skin at bedtime. 10 mL 11  . magnesium oxide (MAG-OX) 400 MG tablet Take 400 mg by mouth daily.    . metoprolol succinate (TOPROL-XL) 25 MG 24 hr tablet Take 1 tablet (25 mg total) by mouth daily. 90 tablet 3  . nitroGLYCERIN (NITROSTAT) 0.4 MG SL tablet Place 0.4 mg under the tongue every 5 (five) minutes as needed for chest pain.    Marland Kitchen ondansetron (ZOFRAN) 8 MG tablet Take 1 tablet (8 mg total) by mouth every 8 (eight) hours as needed for nausea or vomiting. 20 tablet 0  . OXYGEN Inhale into the lungs. 2L continously    . pantoprazole (PROTONIX) 40 MG tablet Take 1 tablet (40 mg total) by mouth daily. 90 tablet 3  . polyethylene glycol (MIRALAX / GLYCOLAX) packet Take 17 g by mouth daily as needed for mild constipation or moderate constipation.     . potassium chloride SA (K-DUR,KLOR-CON) 20 MEQ tablet Take 1 tablet (20 mEq total) by mouth daily. 30 tablet 0  . predniSONE (DELTASONE) 5 MG tablet Please take  for 3 days (starting 12/5), then take  daily for 3 days 9 tablet 0  .  senna (SENOKOT) 8.6 MG TABS tablet Take 8.6 mg by mouth daily as needed for mild constipation or moderate constipation.     Marland Kitchen SPIRIVA HANDIHALER 18 MCG inhalation capsule INHALE THE CONTENTS OF 1 CAPSULE DAILY 90 capsule 0  . SYMBICORT 160-4.5 MCG/ACT inhaler USE 2 INHALATIONS TWICE A DAY 30.6 g 0   No current facility-administered medications for this visit.    Allergies:   Other; Codeine; Sulfonamide derivatives; Warfarin; Penicillins; and Ranitidine hcl    Social History:  The patient  reports that she quit smoking about 2 years ago. Her smoking use included Cigarettes. She has a 29.5 pack-year smoking history. She does not have any smokeless tobacco history on file. She reports that she does not drink alcohol or use illicit drugs.   Family History:  The patient's family history includes Cancer in her brother and sister; Heart failure in her mother; Stroke in her sister. There is no history of Heart attack or Hypertension.    ROS:  General:no colds or fevers, weight down 15 lbs since 03/26/15,, + weakness  Skin:no rashes or ulcers HEENT:no blurred vision, no congestion CV:[name] HPI PUL:[name] HPI GI:no diarrhea constipation or melena, no indigestion GU:no hematuria, no dysuria MS:no joint pain, no claudication Neuro:no syncope, no lightheadedness, very sleepy  Endo:+ diabetes, no thyroid disease  Wt Readings from Last 3 Encounters:  04/23/15 165 lb (74.844 kg)  04/01/15 174 lb 9.6 oz (79.198 kg)  03/26/15 180 lb 5.4 oz (81.8 kg)     PHYSICAL EXAM: VS:  BP 110/50 mmHg  Pulse 85  Ht 6' (1.829 m)  Wt 165 lb (74.844 kg)  BMI 22.37 kg/m2  SpO2 93% , BMI Body mass index is 22.37 kg/(m^2). General:Pleasant affect, NAD Skin:Warm and dry, brisk capillary refill HEENT:normocephalic, sclera clear, mucus membranes moist Neck:supple, no JVD, no bruits  Heart:S1S2  RRR without murmur, gallup, rub or click Lungs:clear to diminished without rales, rhonchi, occ wheezes RUE:AVWU, non tender,  + BS, do not palpate liver spleen or masses Ext:no lower ext edema, 2+ radial pulses Neuro:alert and oriented when awake fall asleep easily,, MAE, follows commands, + facial symmetry    EKG:  EKG is ordered today. The ekg ordered today demonstrates a fib rate controlled at 85 no acute changes.  T wave inversion has improved.    Recent Labs: 11/18/2014: TSH 0.702 11/20/2014: Magnesium 2.4 03/18/2015: B Natriuretic Peptide 153.9* 03/25/2015: Platelets 291 04/17/2015: ALT 16; BUN 15; Creat 0.99*; Hemoglobin 11.6*; Potassium 3.8; Sodium 141    Lipid Panel    Component Value Date/Time   CHOL 134 04/17/2015 0946   TRIG 127 04/17/2015 0946   HDL 56 04/17/2015 0946   CHOLHDL 2.4 04/17/2015 0946   VLDL 25 04/17/2015 0946   LDLCALC 53 04/17/2015 0946       Other studies Reviewed: Additional studies/ records that were reviewed today include: ov notes, hospital notes.   ASSESSMENT AND PLAN:  1.  Permanent a fib now rate controlled.  Since adding dig. Follow up with Dr. Delton [name] in 2 months.  2.  Chronic diastolic HF stable.   3. CAD with hx CABG 2005, and 2014 PCI with BMS to VG-OM2. chest pain-free, Continue aspirin, beta blocker and statin for secondary prevention.   4. HTN controlled to borderline for this pt.   5. Severe COPD and chronic resp. Failure. Followed by Dr. Vassie Loll- no appt until Feb and with recent hospitalization will ask pul to [name] in one to 2 weeks.    6. DM-2 follow up with Dr. Jeannetta Nap.   7. Memory issues, follow up with PCP.   8. Weakness she would benefit from home PT, grandaughter will call Dr. Jeannetta Nap and ask for him to order.  I have sent records through epic.    Current medicines are reviewed with the patient today.  The patient Has no concerns regarding medicines.  The following changes have been made:  [name] above Labs/ tests ordered today include:[name] above  Disposition:   FU:  [name] above  Signed, Leone Brand, NP  04/23/2015 1:43 PM    [facility]  [facility] [address] [place], [place], [place]  [address]/ [address] [place], [place] Phone: [phone]; Fax: [phone]  [phone]

## 2015-04-23 NOTE — Patient Instructions (Signed)
Medication Instructions:  None  Labwork: None  Testing/Procedures: None  Follow-Up: Your physician recommends that you schedule a follow-up appointment sooner with Dr. Vassie LollAlva.  Your physician recommends that you schedule a follow-up appointment in: 2 months with Dr. Delton SeeNelson.   Any Other Special Instructions Will Be Listed Below (If Applicable).     If you need a refill on your cardiac medications before your next appointment, please call your pharmacy.

## 2015-04-30 ENCOUNTER — Inpatient Hospital Stay (HOSPITAL_COMMUNITY)
Admission: EM | Admit: 2015-04-30 | Discharge: 2015-05-06 | DRG: 190 | Disposition: A | Discharge status: Skilled Nursing Facility | Payer: Medicare Other | Attending: Internal Medicine | Admitting: Internal Medicine

## 2015-04-30 ENCOUNTER — Encounter (HOSPITAL_COMMUNITY): Payer: Self-pay | Admitting: Emergency Medicine

## 2015-04-30 DIAGNOSIS — J440 Chronic obstructive pulmonary disease with acute lower respiratory infection: Secondary | ICD-10-CM | POA: Diagnosis not present

## 2015-04-30 DIAGNOSIS — J209 Acute bronchitis, unspecified: Secondary | ICD-10-CM | POA: Diagnosis present

## 2015-04-30 DIAGNOSIS — Z951 Presence of aortocoronary bypass graft: Secondary | ICD-10-CM

## 2015-04-30 DIAGNOSIS — D649 Anemia, unspecified: Secondary | ICD-10-CM | POA: Diagnosis present

## 2015-04-30 DIAGNOSIS — I714 Abdominal aortic aneurysm, without rupture: Secondary | ICD-10-CM | POA: Diagnosis present

## 2015-04-30 DIAGNOSIS — R441 Visual hallucinations: Secondary | ICD-10-CM | POA: Diagnosis present

## 2015-04-30 DIAGNOSIS — Z7982 Long term (current) use of aspirin: Secondary | ICD-10-CM

## 2015-04-30 DIAGNOSIS — Z9981 Dependence on supplemental oxygen: Secondary | ICD-10-CM

## 2015-04-30 DIAGNOSIS — R4182 Altered mental status, unspecified: Secondary | ICD-10-CM | POA: Diagnosis not present

## 2015-04-30 DIAGNOSIS — Z87891 Personal history of nicotine dependence: Secondary | ICD-10-CM

## 2015-04-30 DIAGNOSIS — Z794 Long term (current) use of insulin: Secondary | ICD-10-CM

## 2015-04-30 DIAGNOSIS — G9340 Encephalopathy, unspecified: Secondary | ICD-10-CM | POA: Diagnosis present

## 2015-04-30 DIAGNOSIS — I5032 Chronic diastolic (congestive) heart failure: Secondary | ICD-10-CM | POA: Diagnosis present

## 2015-04-30 DIAGNOSIS — R0902 Hypoxemia: Secondary | ICD-10-CM | POA: Diagnosis present

## 2015-04-30 DIAGNOSIS — I482 Chronic atrial fibrillation: Secondary | ICD-10-CM | POA: Diagnosis present

## 2015-04-30 DIAGNOSIS — N390 Urinary tract infection, site not specified: Secondary | ICD-10-CM | POA: Diagnosis present

## 2015-04-30 DIAGNOSIS — I10 Essential (primary) hypertension: Secondary | ICD-10-CM | POA: Diagnosis present

## 2015-04-30 DIAGNOSIS — Z7952 Long term (current) use of systemic steroids: Secondary | ICD-10-CM

## 2015-04-30 DIAGNOSIS — I340 Nonrheumatic mitral (valve) insufficiency: Secondary | ICD-10-CM | POA: Diagnosis present

## 2015-04-30 DIAGNOSIS — E785 Hyperlipidemia, unspecified: Secondary | ICD-10-CM | POA: Diagnosis present

## 2015-04-30 DIAGNOSIS — E119 Type 2 diabetes mellitus without complications: Secondary | ICD-10-CM | POA: Diagnosis present

## 2015-04-30 DIAGNOSIS — Z955 Presence of coronary angioplasty implant and graft: Secondary | ICD-10-CM

## 2015-04-30 DIAGNOSIS — I2510 Atherosclerotic heart disease of native coronary artery without angina pectoris: Secondary | ICD-10-CM | POA: Diagnosis present

## 2015-04-30 DIAGNOSIS — I110 Hypertensive heart disease with heart failure: Secondary | ICD-10-CM | POA: Diagnosis present

## 2015-04-30 DIAGNOSIS — R001 Bradycardia, unspecified: Secondary | ICD-10-CM | POA: Diagnosis not present

## 2015-04-30 DIAGNOSIS — J441 Chronic obstructive pulmonary disease with (acute) exacerbation: Secondary | ICD-10-CM | POA: Diagnosis present

## 2015-04-30 MED ORDER — SODIUM CHLORIDE 0.9 % IV BOLUS (SEPSIS)
1000.0000 mL | Freq: Once | INTRAVENOUS | Status: AC
  Administered 2015-05-01: 1000 mL via INTRAVENOUS

## 2015-04-30 NOTE — ED Provider Notes (Signed)
CSN: [id number]     Arrival date & time 04/30/15  2334 History  By signing my name below, I, Freida Busmaniana Omoyeni, attest that this documentation has been prepared under the direction and in the presence of Tomasita CrumbleAdeleke Shariah Assad, MD . Electronically Signed: Freida Busmaniana Omoyeni, Scribe. 05/01/2015. 12:09 AM.    Chief Complaint  Patient presents with  . Altered Mental Status   LEVEL 5 CAVEAT DUE TO MENTAL STATUS CHANGE  The history is provided by the patient and the EMS personnel. No language interpreter was used.   HPI Comments:  [name] is a 82 y.o. female with a history of CAD, HTN, HLD,CHF, and COPD, who presents to the Emergency Department via EMS for increased confusion and visual hallucinations. Pt complains of cough x 1 day and back pain. She wears 2L O2 at home. She denies fever. At this time pt is an unreliable historian due to her mental status.  Past Medical History  Diagnosis Date  . CAD (coronary artery disease)     a. s/p CABG x5 (2005) LIMA->LAD, rSVG->diag, seq r SVG -> 1st OM and distal LCX, rSVG->PDA b. s/p BMS-prox SVG-D2 (2014)  . Hypertension   . Hyperlipidemia   . GI bleed 2008    AVMs  . Aneurysm, aortic (HCC)     thoracic aorta, stable at 4.1 cm, chest CT, May, 2012  . Syncope     Nitroglycerin plus a diuretic, April, 2009  . Hyponatremia     Chronic. Felt secondary to SIADH   . COPD (chronic obstructive pulmonary disease) (HCC)     on home oxygen  . Tobacco abuse   . Bradycardia   . Carotid artery disease (HCC)     Hx of endarterectomy. Doppler October, 2011, stable, 0-39% RIC A., 40-59% LICA  . Tuberculosis     Exposures to tuberculosis 1970s, has tested negative by the health Department  . Chronic atrial fibrillation (HCC)     Not felt to be an anticoagulation candidate secondary to hx of GIB/AVM  . Venous stasis of lower extremity     Chronic  . CHF with left ventricular diastolic dysfunction, NYHA class 2 (HCC)     a. EF 55-60%, echo, April, 2013 b. EF 55-60%,  mild biatrial enlargement and PASP 42 mmH  . AAA (abdominal aortic aneurysm) (HCC)   . Complication of anesthesia   . Hx of CABG     CABG 2005  . Hematoma     Right groin hematoma, small, post cath, April, 2014  . Mitral regurgitation     Mild, hospital, March, 2014  . Leg ulcer (HCC)     Ulcerated lesion on the anterior aspect of her left lower leg, June, 2014  . Type 2 diabetes mellitus [facility](HCC)    Past Surgical History  Procedure Laterality Date  . Cholecystectomy    . Carotid endartercetomy    . Abdominal hysterectomy    . Coronary artery bypass graft  2005  . Coronary angioplasty with stent placement  07/18/12    severe native coronary artery disease, 99% proximal stenosis of the SVG-D2 status post successful PTCA/PCI with a Veriflex bare-metal stent, widely patent SVGs to both the right PDA sequential OM1 to OM 2, EF 65-70% and 3+ mitral regurgitation  . Left heart catheterization with coronary angiogram N/A 07/18/2012    Procedure: LEFT HEART CATHETERIZATION WITH CORONARY ANGIOGRAM;  Surgeon: Marykay Lexavid W Harding, MD;  Location: [facility] CATH LAB;  Service: Cardiovascular;  Laterality: N/A;  . Percutaneous coronary stent  intervention (pci-s)  07/18/2012    Procedure: PERCUTANEOUS CORONARY STENT INTERVENTION (PCI-S);  Surgeon: Marykay Lex, MD;  Location: [facility] CATH LAB;  Service: Cardiovascular;;  . Ercp N/A 11/20/2014    Procedure: ENDOSCOPIC RETROGRADE CHOLANGIOPANCREATOGRAPHY (ERCP);  Surgeon: Meryl Dare, MD;  Location: [facility] ENDOSCOPY;  Service: Endoscopy;  Laterality: N/A;   Family History  Problem Relation Age of Onset  . Stroke Sister   . Heart failure Mother   . Cancer Sister     Breast and Lung   . Cancer Brother     breast cancer  . Heart attack Neg Hx   . Hypertension Neg Hx    Social History  Substance Use Topics  . Smoking status: Former Smoker -- 0.50 packs/day for 59 years    Types: Cigarettes    Quit date: 02/01/2013  . Smokeless tobacco: None  . Alcohol Use: No    OB History    No data available     Review of Systems  Unable to perform ROS: Mental status change   Allergies  Other; Codeine; Sulfonamide derivatives; Warfarin; Penicillins; and Ranitidine hcl  Home Medications   Prior to Admission medications   Medication Sig Start Date End Date Taking? Authorizing Provider  albuterol (PROVENTIL HFA;VENTOLIN HFA) 108 (90 BASE) MCG/ACT inhaler Inhale 2 puffs into the lungs every 4 (four) hours as needed for wheezing or shortness of breath. 07/16/14   Tammy S Parrett, NP  albuterol (PROVENTIL) (2.5 MG/3ML) 0.083% nebulizer solution Take 2.5 mg by nebulization every 6 (six) hours as needed for wheezing.    Historical Provider, MD  ALPRAZolam Prudy Feeler) 0.25 MG tablet Take 1 tablet (0.25 mg total) by mouth at bedtime as needed for anxiety. 11/23/14   Leroy Sea, MD  Amino Acids-Protein Hydrolys (FEEDING SUPPLEMENT, PRO-STAT SUGAR FREE 64,) LIQD Take 30 mLs by mouth daily. 03/26/15   Palma Holter, MD  aspirin EC 81 MG tablet Take 81 mg by mouth daily.    Historical Provider, MD  atorvastatin (LIPITOR) 40 MG tablet Take 0.5 tablets (20 mg total) by mouth daily at 6 PM. 11/07/13   Rosalio Macadamia, NP  digoxin (LANOXIN) 0.125 MG tablet Take 1 tablet (0.125 mg total) by mouth daily. 04/01/15   Lars Masson, MD  diltiazem (CARDIZEM CD) 240 MG 24 hr capsule Take 1 capsule (240 mg total) by mouth daily. 03/28/15   Palma Holter, MD  feeding supplement, ENSURE ENLIVE, (ENSURE ENLIVE) LIQD Take 237 mLs by mouth daily. 03/26/15   Palma Holter, MD  furosemide (LASIX) 40 MG tablet Take 1 tablet (40 mg total) by mouth daily. 03/28/15   Palma Holter, MD  insulin aspart (NOVOLOG) 100 UNIT/ML injection Before each meal 3 times a day, 140-199 - 2 units, 200-250 - 4 units, 251-299 - 6 units,  300-349 - 8 units,  350 or above 10 units. Dispense syringes and needles as needed, Ok to switch to PEN if approved. Substitute to any brand approved. DX  DM2, Code E11.65 11/23/14   Leroy Sea, MD  insulin glargine (LANTUS) 100 UNIT/ML injection Inject 0.1 mLs (10 Units total) into the skin at bedtime. 03/26/15   Palma Holter, MD  magnesium oxide (MAG-OX) 400 MG tablet Take 400 mg by mouth daily.    Historical Provider, MD  metoprolol succinate (TOPROL-XL) 25 MG 24 hr tablet Take 1 tablet (25 mg total) by mouth daily. 12/26/14   Leone Brand, NP  nitroGLYCERIN (NITROSTAT) 0.4 MG SL  tablet Place 0.4 mg under the tongue every 5 (five) minutes as needed for chest pain.    Historical Provider, MD  ondansetron (ZOFRAN) 8 MG tablet Take 1 tablet (8 mg total) by mouth every 8 (eight) hours as needed for nausea or vomiting. 10/28/14   Mancel Bale, MD  OXYGEN Inhale into the lungs. 2L continously    Historical Provider, MD  pantoprazole (PROTONIX) 40 MG tablet Take 1 tablet (40 mg total) by mouth daily. 12/26/14   Leone Brand, NP  polyethylene glycol [facility] / GLYCOLAX) packet Take 17 g by mouth daily as needed for mild constipation or moderate constipation.     Historical Provider, MD  potassium chloride SA (K-DUR,KLOR-CON) 20 MEQ tablet Take 1 tablet (20 mEq total) by mouth daily. 03/28/15   Palma Holter, MD  predniSONE (DELTASONE) 5 MG tablet Please take 10mg  for 3 days (starting 12/5), then take 5mg  daily for 3 days 04/01/15   Palma Holter, MD  senna (SENOKOT) 8.6 MG TABS tablet Take 8.6 mg by mouth daily as needed for mild constipation or moderate constipation.     Historical Provider, MD  SPIRIVA HANDIHALER 18 MCG inhalation capsule INHALE THE CONTENTS OF 1 CAPSULE DAILY 03/07/15   Oretha Milch, MD  SYMBICORT 160-4.5 MCG/ACT inhaler USE 2 INHALATIONS TWICE A DAY 11/19/14   Oretha Milch, MD   BP 122/64 mmHg  Pulse 86  Temp(Src) 98.1 F (36.7 C) (Oral)  Resp 24  Ht 5\' 10"  (1.778 m)  Wt 165 lb (74.844 kg)  BMI 23.68 kg/m2  SpO2 98% Physical Exam  Constitutional: She appears well-developed and well-nourished. No  distress.  HENT:  Head: Normocephalic and atraumatic.  Nose: Nose normal.  Mouth/Throat: Oropharynx is clear and moist. No oropharyngeal exudate.  [place] in place  Eyes: Conjunctivae and EOM are normal. Pupils are equal, round, and reactive to light. No scleral icterus.  Neck: Normal range of motion. Neck supple. No JVD present. No tracheal deviation present. No thyromegaly present.  Cardiovascular: Normal rate, regular rhythm and normal heart sounds.  Exam reveals no gallop and no friction rub.   No murmur heard. Pulmonary/Chest: Effort normal and breath sounds normal. No respiratory distress. She has no wheezes. She exhibits no tenderness.  Abdominal: Soft. Bowel sounds are normal. She exhibits no distension and no mass. There is no tenderness. There is no rebound and no guarding.  Musculoskeletal: Normal range of motion. She exhibits no edema or tenderness.  Lymphadenopathy:    She has no cervical adenopathy.  Neurological: She is alert.  Skin: Skin is warm and dry. No rash noted. No erythema. No pallor.  Nursing note and vitals reviewed.   ED Course  Procedures   DIAGNOSTIC STUDIES:  Oxygen Saturation is 96% on [place], normal by my interpretation.    COORDINATION OF CARE:  11:53 PM Discussed treatment plan with pt at bedside and pt agreed to plan.  Labs Review Labs Reviewed  CBC WITH DIFFERENTIAL/PLATELET - Abnormal; Notable for the following:    WBC 10.7 (*)    RBC 3.46 (*)    Hemoglobin 10.0 (*)    HCT 31.3 (*)    Neutro Abs 8.6 (*)    Monocytes Absolute 1.2 (*)    All other components within normal limits  COMPREHENSIVE METABOLIC PANEL - Abnormal; Notable for the following:    Chloride 96 (*)    Glucose, Bld 106 (*)    Creatinine, Ser 1.42 (*)    Total Protein 5.7 (*)  Albumin 3.0 (*)    ALT 13 (*)    GFR calc non Af Amer 33 (*)    GFR calc Af Amer 38 (*)    All other components within normal limits  URINALYSIS, ROUTINE W REFLEX MICROSCOPIC (NOT AT [facility]) -  Abnormal; Notable for the following:    Bilirubin Urine MODERATE (*)    Ketones, ur 15 (*)    Leukocytes, UA MODERATE (*)    All other components within normal limits  URINE RAPID DRUG SCREEN, HOSP PERFORMED - Abnormal; Notable for the following:    Benzodiazepines POSITIVE (*)    All other components within normal limits  ACETAMINOPHEN LEVEL - Abnormal; Notable for the following:    Acetaminophen (Tylenol), Serum <10 (*)    All other components within normal limits  URINE MICROSCOPIC-ADD ON - Abnormal; Notable for the following:    Squamous Epithelial / LPF 0-5 (*)    Bacteria, UA RARE (*)    Casts HYALINE CASTS (*)    All other components within normal limits  URINE CULTURE  CULTURE, BLOOD (ROUTINE X 2)  CULTURE, BLOOD (ROUTINE X 2)  LIPASE, BLOOD  ETHANOL  AMMONIA  SALICYLATE LEVEL  MAGNESIUM  PROTIME-INR  I-STAT CG4 LACTIC ACID, ED  Rosezena Sensor, ED    Imaging Review Dg Chest 2 View  05/01/2015  CLINICAL DATA:  Altered mental status, cough EXAM: CHEST  2 VIEW COMPARISON:  03/24/2015 FINDINGS: Sternotomy wires overlie normal cardiac silhouette. Lungs are hyperinflated. No effusion, infiltrate, pneumothorax. IMPRESSION: Hyperinflated lungs.  No acute findings. Electronically Signed   By: Genevive Bi M.D.   On: 05/01/2015 00:25   Ct Head Wo Contrast  05/01/2015  CLINICAL DATA:  Altered mental status.  , fall EXAM: CT HEAD WITHOUT CONTRAST TECHNIQUE: Contiguous axial images were obtained from the base of the skull through the vertex without intravenous contrast. COMPARISON:  Head CT 01/06/2007 FINDINGS: No intracranial hemorrhage. No parenchymal contusion. No midline shift or mass effect. Basilar cisterns are patent. No skull base fracture. No fluid in the paranasal sinuses or mastoid air cells. Orbits are normal. Mucosal thickening in the maxillary sinuses. Periventricular white matter hypodensity. Lucent lesion in the occipital bone LEFT of midline is unchanged from 2008.  IMPRESSION: 1. No acute intracranial findings. 2. No evidence trauma. 3. White matter microvascular disease. 4. Sinus inflammation. Electronically Signed   By: Genevive Bi M.D.   On: 05/01/2015 00:29   I have personally reviewed and evaluated these images and lab results as part of my medical decision-making.   EKG Interpretation   Date/Time:  Tuesday April 30 2015 23:43:02 EST Ventricular Rate:  93 PR Interval:  45 QRS Duration: 105 QT Interval:  351 QTC Calculation: 436 R Axis:   -42 Text Interpretation:  Atrial fibrillation Incomplete RBBB and LAFB  Anteroseptal infarct, age indeterminate Lateral leads are also involved No  significant change since last tracing Confirmed by Erroll Luna  [phone]) on 05/01/2015 12:08:01 AM      MDM   Final diagnoses:  None   Patient presents to the ED for AMS.  She has been hallucinating and not knowing where she is which is unusal for her.   Daughter-in-law states this is normally from a UTI. Workup in the emergency ydepartment is negative. I spoke with the hospitalist, Dr. Toniann Fail, is requesting for an MRI to evaluate for stroke. Patient will be admitted to telemetry for further care.   I personally performed the services described in this documentation, which  was scribed in my presence. The recorded information has been reviewed and is accurate.      Tomasita Crumble, MD 05/01/15 [phone]

## 2015-04-30 NOTE — ED Notes (Signed)
Pt brought to ED from home by [facility] for increase confusion and visual hallucination, EMS reported that pt was having hallucination in and out during her way to ED. VSS, Afib on EKG, BP 120/62, HR-92, R 22, O2 99 on 2L, pt is O2 home dependent at 2L.

## 2015-05-01 ENCOUNTER — Emergency Department (HOSPITAL_COMMUNITY): Payer: Medicare Other

## 2015-05-01 ENCOUNTER — Encounter (HOSPITAL_COMMUNITY): Payer: Self-pay | Admitting: Internal Medicine

## 2015-05-01 ENCOUNTER — Inpatient Hospital Stay (HOSPITAL_COMMUNITY): Payer: Medicare Other

## 2015-05-01 ENCOUNTER — Ambulatory Visit: Payer: Medicare Other | Admitting: Acute Care

## 2015-05-01 DIAGNOSIS — Z955 Presence of coronary angioplasty implant and graft: Secondary | ICD-10-CM | POA: Diagnosis not present

## 2015-05-01 DIAGNOSIS — Z7952 Long term (current) use of systemic steroids: Secondary | ICD-10-CM | POA: Diagnosis not present

## 2015-05-01 DIAGNOSIS — G9340 Encephalopathy, unspecified: Secondary | ICD-10-CM | POA: Diagnosis present

## 2015-05-01 DIAGNOSIS — I714 Abdominal aortic aneurysm, without rupture: Secondary | ICD-10-CM | POA: Diagnosis present

## 2015-05-01 DIAGNOSIS — R4182 Altered mental status, unspecified: Secondary | ICD-10-CM | POA: Diagnosis present

## 2015-05-01 DIAGNOSIS — I482 Chronic atrial fibrillation: Secondary | ICD-10-CM | POA: Diagnosis present

## 2015-05-01 DIAGNOSIS — Z7982 Long term (current) use of aspirin: Secondary | ICD-10-CM | POA: Diagnosis not present

## 2015-05-01 DIAGNOSIS — D649 Anemia, unspecified: Secondary | ICD-10-CM | POA: Diagnosis present

## 2015-05-01 DIAGNOSIS — I2510 Atherosclerotic heart disease of native coronary artery without angina pectoris: Secondary | ICD-10-CM | POA: Diagnosis present

## 2015-05-01 DIAGNOSIS — I5032 Chronic diastolic (congestive) heart failure: Secondary | ICD-10-CM | POA: Diagnosis present

## 2015-05-01 DIAGNOSIS — N390 Urinary tract infection, site not specified: Secondary | ICD-10-CM | POA: Diagnosis present

## 2015-05-01 DIAGNOSIS — J440 Chronic obstructive pulmonary disease with acute lower respiratory infection: Secondary | ICD-10-CM | POA: Diagnosis present

## 2015-05-01 DIAGNOSIS — I110 Hypertensive heart disease with heart failure: Secondary | ICD-10-CM | POA: Diagnosis present

## 2015-05-01 DIAGNOSIS — Z87891 Personal history of nicotine dependence: Secondary | ICD-10-CM | POA: Diagnosis not present

## 2015-05-01 DIAGNOSIS — E785 Hyperlipidemia, unspecified: Secondary | ICD-10-CM | POA: Diagnosis present

## 2015-05-01 DIAGNOSIS — Z951 Presence of aortocoronary bypass graft: Secondary | ICD-10-CM | POA: Diagnosis not present

## 2015-05-01 DIAGNOSIS — E119 Type 2 diabetes mellitus without complications: Secondary | ICD-10-CM | POA: Diagnosis present

## 2015-05-01 DIAGNOSIS — Z9981 Dependence on supplemental oxygen: Secondary | ICD-10-CM | POA: Diagnosis not present

## 2015-05-01 DIAGNOSIS — J441 Chronic obstructive pulmonary disease with (acute) exacerbation: Secondary | ICD-10-CM | POA: Diagnosis not present

## 2015-05-01 DIAGNOSIS — R0902 Hypoxemia: Secondary | ICD-10-CM | POA: Diagnosis present

## 2015-05-01 DIAGNOSIS — R441 Visual hallucinations: Secondary | ICD-10-CM | POA: Diagnosis present

## 2015-05-01 DIAGNOSIS — I340 Nonrheumatic mitral (valve) insufficiency: Secondary | ICD-10-CM | POA: Diagnosis present

## 2015-05-01 DIAGNOSIS — Z794 Long term (current) use of insulin: Secondary | ICD-10-CM | POA: Diagnosis not present

## 2015-05-01 DIAGNOSIS — J209 Acute bronchitis, unspecified: Secondary | ICD-10-CM | POA: Diagnosis present

## 2015-05-01 DIAGNOSIS — R001 Bradycardia, unspecified: Secondary | ICD-10-CM | POA: Diagnosis not present

## 2015-05-01 LAB — COMPREHENSIVE METABOLIC PANEL
ALBUMIN: 2.7 g/dL — AB (ref 3.5–5.0)
ALBUMIN: 3 g/dL — AB (ref 3.5–5.0)
ALK PHOS: 77 U/L (ref 38–126)
ALT: 12 U/L — ABNORMAL LOW (ref 14–54)
ALT: 13 U/L — AB (ref 14–54)
ANION GAP: 8 (ref 5–15)
AST: 17 U/L (ref 15–41)
AST: 15 U/L (ref 15–41)
Alkaline Phosphatase: 73 U/L (ref 38–126)
Anion gap: 11 (ref 5–15)
BILIRUBIN TOTAL: 0.8 mg/dL (ref 0.3–1.2)
BUN: 16 mg/dL (ref 6–20)
BUN: 18 mg/dL (ref 6–20)
CALCIUM: 10 mg/dL (ref 8.9–10.3)
CO2: 30 mmol/L (ref 22–32)
CO2: 32 mmol/L (ref 22–32)
CREATININE: 1.42 mg/dL — AB (ref 0.44–1.00)
Calcium: 9.6 mg/dL (ref 8.9–10.3)
Chloride: 96 mmol/L — ABNORMAL LOW (ref 101–111)
Chloride: 99 mmol/L — ABNORMAL LOW (ref 101–111)
Creatinine, Ser: 1.18 mg/dL — ABNORMAL HIGH (ref 0.44–1.00)
GFR calc Af Amer: 38 mL/min — ABNORMAL LOW (ref >60)
GFR calc Af Amer: 48 mL/min — ABNORMAL LOW (ref >60)
GFR calc non Af Amer: 33 mL/min — ABNORMAL LOW (ref >60)
GFR calc non Af Amer: 41 mL/min — ABNORMAL LOW (ref >60)
GLUCOSE: 100 mg/dL — AB (ref 65–99)
GLUCOSE: 106 mg/dL — AB (ref 65–99)
POTASSIUM: 3.6 mmol/L (ref 3.5–5.1)
Potassium: 3.7 mmol/L (ref 3.5–5.1)
SODIUM: 137 mmol/L (ref 135–145)
Sodium: 139 mmol/L (ref 135–145)
TOTAL PROTEIN: 5.2 g/dL — AB (ref 6.5–8.1)
Total Bilirubin: 1.1 mg/dL (ref 0.3–1.2)
Total Protein: 5.7 g/dL — ABNORMAL LOW (ref 6.5–8.1)

## 2015-05-01 LAB — CBC
HEMATOCRIT: 30 % — AB (ref 36.0–46.0)
Hemoglobin: 9.2 g/dL — ABNORMAL LOW (ref 12.0–15.0)
MCH: 27.8 pg (ref 26.0–34.0)
MCHC: 30.7 g/dL (ref 30.0–36.0)
MCV: 90.6 fL (ref 78.0–100.0)
Platelets: 254 10*3/uL (ref 150–400)
RBC: 3.31 MIL/uL — ABNORMAL LOW (ref 3.87–5.11)
RDW: 15.3 % (ref 11.5–15.5)
WBC: 7.5 10*3/uL (ref 4.0–10.5)

## 2015-05-01 LAB — URINALYSIS, ROUTINE W REFLEX MICROSCOPIC
GLUCOSE, UA: NEGATIVE mg/dL
HGB URINE DIPSTICK: NEGATIVE
KETONES UR: 15 mg/dL — AB
NITRITE: NEGATIVE
PH: 5.5 (ref 5.0–8.0)
PROTEIN: NEGATIVE mg/dL
SPECIFIC GRAVITY, URINE: 1.017 (ref 1.005–1.030)

## 2015-05-01 LAB — SALICYLATE LEVEL

## 2015-05-01 LAB — CBC WITH DIFFERENTIAL/PLATELET
BASOS PCT: 0 %
Basophils Absolute: 0 10*3/uL (ref 0.0–0.1)
EOS ABS: 0.1 10*3/uL (ref 0.0–0.7)
Eosinophils Relative: 1 %
HEMATOCRIT: 31.3 % — AB (ref 36.0–46.0)
HEMOGLOBIN: 10 g/dL — AB (ref 12.0–15.0)
LYMPHS ABS: 0.8 10*3/uL (ref 0.7–4.0)
Lymphocytes Relative: 8 %
MCH: 28.9 pg (ref 26.0–34.0)
MCHC: 31.9 g/dL (ref 30.0–36.0)
MCV: 90.5 fL (ref 78.0–100.0)
Monocytes Absolute: 1.2 10*3/uL — ABNORMAL HIGH (ref 0.1–1.0)
Monocytes Relative: 11 %
NEUTROS ABS: 8.6 10*3/uL — AB (ref 1.7–7.7)
NEUTROS PCT: 80 %
Platelets: 283 10*3/uL (ref 150–400)
RBC: 3.46 MIL/uL — AB (ref 3.87–5.11)
RDW: 15.3 % (ref 11.5–15.5)
WBC: 10.7 10*3/uL — AB (ref 4.0–10.5)

## 2015-05-01 LAB — LIPASE, BLOOD: Lipase: 33 U/L (ref 11–51)

## 2015-05-01 LAB — GLUCOSE, CAPILLARY
GLUCOSE-CAPILLARY: 202 mg/dL — AB (ref 65–99)
Glucose-Capillary: 92 mg/dL (ref 65–99)
Glucose-Capillary: 106 mg/dL — ABNORMAL HIGH (ref 65–99)
Glucose-Capillary: 99 mg/dL (ref 65–99)

## 2015-05-01 LAB — URINE MICROSCOPIC-ADD ON

## 2015-05-01 LAB — RAPID URINE DRUG SCREEN, HOSP PERFORMED
AMPHETAMINES: NOT DETECTED
BENZODIAZEPINES: POSITIVE — AB
Barbiturates: NOT DETECTED
Cocaine: NOT DETECTED
OPIATES: NOT DETECTED
Tetrahydrocannabinol: NOT DETECTED

## 2015-05-01 LAB — AMMONIA: AMMONIA: 10 umol/L (ref 9–35)

## 2015-05-01 LAB — DIGOXIN LEVEL: DIGOXIN LVL: 1.1 ng/mL (ref 0.8–2.0)

## 2015-05-01 LAB — PROTIME-INR
INR: 1.02 (ref 0.00–1.49)
PROTHROMBIN TIME: 13.6 s (ref 11.6–15.2)

## 2015-05-01 LAB — ACETAMINOPHEN LEVEL: Acetaminophen (Tylenol), Serum: <10 ug/mL — ABNORMAL LOW (ref 10–30)

## 2015-05-01 LAB — MAGNESIUM: Magnesium: 1.9 mg/dL (ref 1.7–2.4)

## 2015-05-01 LAB — ETHANOL

## 2015-05-01 LAB — I-STAT CG4 LACTIC ACID, ED: Lactic Acid, Venous: 1.59 mmol/L (ref 0.5–2.0)

## 2015-05-01 MED ORDER — ASPIRIN EC 81 MG PO TBEC
81.0000 mg | DELAYED_RELEASE_TABLET | Freq: Every day | ORAL | Status: DC
Start: 2015-05-01 — End: 2015-05-06
  Administered 2015-05-01 – 2015-05-06 (×6): 81 mg via ORAL
  Filled 2015-05-01 (×6): qty 1

## 2015-05-01 MED ORDER — MAGNESIUM OXIDE 400 (241.3 MG) MG PO TABS
400.0000 mg | ORAL_TABLET | Freq: Every day | ORAL | Status: DC
  Administered 2015-05-01 – 2015-05-05 (×5): 400 mg via ORAL
  Filled 2015-05-01 (×5): qty 1

## 2015-05-01 MED ORDER — PANTOPRAZOLE SODIUM 40 MG PO TBEC
40.0000 mg | DELAYED_RELEASE_TABLET | Freq: Every day | ORAL | Status: DC
  Administered 2015-05-01 – 2015-05-06 (×6): 40 mg via ORAL
  Filled 2015-05-01 (×6): qty 1

## 2015-05-01 MED ORDER — ENSURE ENLIVE PO LIQD
237.0000 mL | ORAL | Status: DC
  Administered 2015-05-01 – 2015-05-06 (×5): 237 mL via ORAL

## 2015-05-01 MED ORDER — BUDESONIDE 0.25 MG/2ML IN SUSP
0.2500 mg | Freq: Two times a day (BID) | RESPIRATORY_TRACT | Status: DC
  Administered 2015-05-01 – 2015-05-06 (×11): 0.25 mg via RESPIRATORY_TRACT
  Filled 2015-05-01 (×12): qty 2

## 2015-05-01 MED ORDER — IPRATROPIUM-ALBUTEROL 0.5-2.5 (3) MG/3ML IN SOLN
3.0000 mL | RESPIRATORY_TRACT | Status: DC
  Administered 2015-05-01 – 2015-05-02 (×6): 3 mL via RESPIRATORY_TRACT
  Filled 2015-05-01 (×6): qty 3

## 2015-05-01 MED ORDER — POTASSIUM CHLORIDE CRYS ER 20 MEQ PO TBCR
20.0000 meq | EXTENDED_RELEASE_TABLET | Freq: Every day | ORAL | Status: DC
  Administered 2015-05-01 – 2015-05-04 (×4): 20 meq via ORAL
  Filled 2015-05-01 (×4): qty 1

## 2015-05-01 MED ORDER — CETYLPYRIDINIUM CHLORIDE 0.05 % MT LIQD
7.0000 mL | Freq: Two times a day (BID) | OROMUCOSAL | Status: DC
  Administered 2015-05-01 – 2015-05-06 (×11): 7 mL via OROMUCOSAL

## 2015-05-01 MED ORDER — IPRATROPIUM BROMIDE 0.02 % IN SOLN: 0.5000 mg | RESPIRATORY_TRACT | Status: DC

## 2015-05-01 MED ORDER — NITROGLYCERIN 0.4 MG SL SUBL: 0.4000 mg | SUBLINGUAL_TABLET | SUBLINGUAL | Status: DC | PRN

## 2015-05-01 MED ORDER — METOPROLOL SUCCINATE ER 25 MG PO TB24
25.0000 mg | ORAL_TABLET | Freq: Every day | ORAL | Status: DC
  Administered 2015-05-01 – 2015-05-04 (×4): 25 mg via ORAL
  Filled 2015-05-01 (×4): qty 1

## 2015-05-01 MED ORDER — FUROSEMIDE 40 MG PO TABS
40.0000 mg | ORAL_TABLET | Freq: Every day | ORAL | Status: DC
  Administered 2015-05-01 – 2015-05-06 (×6): 40 mg via ORAL
  Filled 2015-05-01 (×6): qty 1

## 2015-05-01 MED ORDER — CEPHALEXIN 500 MG PO CAPS
500.0000 mg | ORAL_CAPSULE | Freq: Three times a day (TID) | ORAL | Status: DC
  Administered 2015-05-01 – 2015-05-05 (×13): 500 mg via ORAL
  Filled 2015-05-01 (×12): qty 1

## 2015-05-01 MED ORDER — DIGOXIN 125 MCG PO TABS
0.1250 mg | ORAL_TABLET | Freq: Every day | ORAL | Status: DC
  Administered 2015-05-01 – 2015-05-04 (×4): 0.125 mg via ORAL
  Filled 2015-05-01 (×4): qty 1

## 2015-05-01 MED ORDER — ALPRAZOLAM 0.25 MG PO TABS
0.2500 mg | ORAL_TABLET | Freq: Every evening | ORAL | Status: DC | PRN
  Administered 2015-05-05: 0.25 mg via ORAL
  Filled 2015-05-01: qty 1

## 2015-05-01 MED ORDER — INSULIN GLARGINE 100 UNIT/ML ~~LOC~~ SOLN
10.0000 [IU] | Freq: Every day | SUBCUTANEOUS | Status: DC
  Administered 2015-05-01 – 2015-05-05 (×5): 10 [IU] via SUBCUTANEOUS
  Filled 2015-05-01 (×6): qty 0.1

## 2015-05-01 MED ORDER — PRO-STAT SUGAR FREE PO LIQD
30.0000 mL | Freq: Every day | ORAL | Status: DC
  Administered 2015-05-01 – 2015-05-06 (×6): 30 mL via ORAL
  Filled 2015-05-01 (×6): qty 30

## 2015-05-01 MED ORDER — METHYLPREDNISOLONE SODIUM SUCC 125 MG IJ SOLR
60.0000 mg | Freq: Four times a day (QID) | INTRAMUSCULAR | Status: DC
  Administered 2015-05-01 – 2015-05-02 (×5): 60 mg via INTRAVENOUS
  Filled 2015-05-01 (×4): qty 2

## 2015-05-01 MED ORDER — INSULIN ASPART 100 UNIT/ML ~~LOC~~ SOLN
0.0000 [IU] | Freq: Three times a day (TID) | SUBCUTANEOUS | Status: DC
  Administered 2015-05-02: 3 [IU] via SUBCUTANEOUS
  Administered 2015-05-02: 2 [IU] via SUBCUTANEOUS
  Administered 2015-05-02 – 2015-05-03 (×2): 3 [IU] via SUBCUTANEOUS
  Administered 2015-05-03 (×2): 2 [IU] via SUBCUTANEOUS
  Administered 2015-05-04: 3 [IU] via SUBCUTANEOUS
  Administered 2015-05-04: 1 [IU] via SUBCUTANEOUS
  Administered 2015-05-04: 2 [IU] via SUBCUTANEOUS
  Administered 2015-05-05: 3 [IU] via SUBCUTANEOUS
  Administered 2015-05-05: 1 [IU] via SUBCUTANEOUS
  Administered 2015-05-06: 2 [IU] via SUBCUTANEOUS

## 2015-05-01 MED ORDER — ALBUTEROL SULFATE (2.5 MG/3ML) 0.083% IN NEBU: 2.5000 mg | INHALATION_SOLUTION | RESPIRATORY_TRACT | Status: DC | PRN

## 2015-05-01 MED ORDER — ALBUTEROL SULFATE (2.5 MG/3ML) 0.083% IN NEBU: 2.5000 mg | INHALATION_SOLUTION | RESPIRATORY_TRACT | Status: DC

## 2015-05-01 MED ORDER — ATORVASTATIN CALCIUM 20 MG PO TABS
20.0000 mg | ORAL_TABLET | Freq: Every day | ORAL | Status: DC
  Administered 2015-05-01 – 2015-05-05 (×5): 20 mg via ORAL
  Filled 2015-05-01 (×5): qty 1

## 2015-05-01 MED ORDER — SENNA 8.6 MG PO TABS: 8.6000 mg | ORAL_TABLET | Freq: Every day | ORAL | Status: DC | PRN

## 2015-05-01 MED ORDER — FUROSEMIDE 10 MG/ML IJ SOLN
40.0000 mg | Freq: Once | INTRAMUSCULAR | Status: AC
  Administered 2015-05-01: 40 mg via INTRAVENOUS
  Filled 2015-05-01: qty 4

## 2015-05-01 MED ORDER — ONDANSETRON HCL 4 MG PO TABS: 8.0000 mg | ORAL_TABLET | Freq: Three times a day (TID) | ORAL | Status: DC | PRN

## 2015-05-01 MED ORDER — POLYETHYLENE GLYCOL 3350 17 G PO PACK: 17.0000 g | PACK | Freq: Every day | ORAL | Status: DC | PRN

## 2015-05-01 MED ORDER — LEVOFLOXACIN IN D5W 750 MG/150ML IV SOLN
750.0000 mg | INTRAVENOUS | Status: DC
  Administered 2015-05-01: 750 mg via INTRAVENOUS
  Filled 2015-05-01: qty 150

## 2015-05-01 MED ORDER — DILTIAZEM HCL ER COATED BEADS 120 MG PO CP24
240.0000 mg | ORAL_CAPSULE | Freq: Every day | ORAL | Status: DC
  Administered 2015-05-01 – 2015-05-04 (×4): 240 mg via ORAL
  Filled 2015-05-01 (×4): qty 2

## 2015-05-01 MED ORDER — ENOXAPARIN SODIUM 40 MG/0.4ML ~~LOC~~ SOLN
40.0000 mg | SUBCUTANEOUS | Status: DC
  Administered 2015-05-01 – 2015-05-06 (×6): 40 mg via SUBCUTANEOUS
  Filled 2015-05-01 (×6): qty 0.4

## 2015-05-01 NOTE — H&P (Addendum)
[facility] History and Physical  [name] JXB:[id number] DOB: [birth year] DOA: 04/30/2015  Referring physician: Dr.Oni. PCP: Kaleen Mask, MD  Specialists: None.  Chief Complaint: Increasing confusion.  History obtained from patient's grandson.  HPI: [name] is a 82 y.o. female with history of CAD status post CABG, diastolic CHF, COPD, GI bleed, chronic atrial fibrillation was brought to the ER after patient was found to be having increasing confusion and agitation since yesterday morning. In the ER patient is found to be confused but afebrile. CT of the head did not show any acute. As per the family if patient gets UTI patient usually gets confused. UA shows W BCs and leukocyte esterase positive but also some squamous cells. On exam patient does have diffuse wheezing. Patient has been admitted for acute encephalopathy probably from UTI. MRI brain is pending. In addition patient is also having COPD exacerbation.   Review of Systems: As presented in the history of presenting illness, rest negative.  Past Medical History  Diagnosis Date  . CAD (coronary artery disease)     a. s/p CABG x5 (2005) LIMA->LAD, rSVG->diag, seq r SVG -> 1st OM and distal LCX, rSVG->PDA b. s/p BMS-prox SVG-D2 (2014)  . Hypertension   . Hyperlipidemia   . GI bleed 2008    AVMs  . Aneurysm, aortic (HCC)     thoracic aorta, stable at 4.1 cm, chest CT, May, 2012  . Syncope     Nitroglycerin plus a diuretic, April, 2009  . Hyponatremia     Chronic. Felt secondary to SIADH   . COPD (chronic obstructive pulmonary disease) (HCC)     on home oxygen  . Tobacco abuse   . Bradycardia   . Carotid artery disease (HCC)     Hx of endarterectomy. Doppler October, 2011, stable, 0-39% RIC A., 40-59% LICA  . Tuberculosis     Exposures to tuberculosis 1970s, has tested negative by the health Department  . Chronic atrial fibrillation (HCC)     Not felt to be an anticoagulation candidate secondary  to hx of GIB/AVM  . Venous stasis of lower extremity     Chronic  . CHF with left ventricular diastolic dysfunction, NYHA class 2 (HCC)     a. EF 55-60%, echo, April, 2013 b. EF 55-60%, mild biatrial enlargement and PASP 42 mmH  . AAA (abdominal aortic aneurysm) (HCC)   . Complication of anesthesia   . Hx of CABG     CABG 2005  . Hematoma     Right groin hematoma, small, post cath, April, 2014  . Mitral regurgitation     Mild, hospital, March, 2014  . Leg ulcer (HCC)     Ulcerated lesion on the anterior aspect of her left lower leg, June, 2014  . Type 2 diabetes mellitus [facility])    Past Surgical History  Procedure Laterality Date  . Cholecystectomy    . Carotid endartercetomy    . Abdominal hysterectomy    . Coronary artery bypass graft  2005  . Coronary angioplasty with stent placement  07/18/12    severe native coronary artery disease, 99% proximal stenosis of the SVG-D2 status post successful PTCA/PCI with a Veriflex bare-metal stent, widely patent SVGs to both the right PDA sequential OM1 to OM 2, EF 65-70% and 3+ mitral regurgitation  . Left heart catheterization with coronary angiogram N/A 07/18/2012    Procedure: LEFT HEART CATHETERIZATION WITH CORONARY ANGIOGRAM;  Surgeon: Marykay Lex, MD;  Location: [facility] CATH LAB;  Service: Cardiovascular;  Laterality: N/A;  . Percutaneous coronary stent intervention (pci-s)  07/18/2012    Procedure: PERCUTANEOUS CORONARY STENT INTERVENTION (PCI-S);  Surgeon: Marykay Lex, MD;  Location: [facility] CATH LAB;  Service: Cardiovascular;;  . Ercp N/A 11/20/2014    Procedure: ENDOSCOPIC RETROGRADE CHOLANGIOPANCREATOGRAPHY (ERCP);  Surgeon: Meryl Dare, MD;  Location: [facility] ENDOSCOPY;  Service: Endoscopy;  Laterality: N/A;   Social History:  reports that she quit smoking about 2 years ago. Her smoking use included Cigarettes. She has a 29.5 pack-year smoking history. She does not have any smokeless tobacco history on file. She reports that she does not  drink alcohol or use illicit drugs. Where does patient live home. Can patient participate in ADLs? No.  Allergies  Allergen Reactions  . Other Other (See Comments)    TETANUS-can only take 1/2 dose at one time, can take the other 1/2 dose about 3 days later  . Codeine Nausea And Vomiting  . Sulfonamide Derivatives Nausea And Vomiting  . Warfarin     H/o AVM's prechief complaintcluding anticoagulation  . Penicillins Itching, Rash and Other (See Comments)    Nasal itching   . Ranitidine Hcl Itching and Rash    Family History:  Family History  Problem Relation Age of Onset  . Stroke Sister   . Heart failure Mother   . Cancer Sister     Breast and Lung   . Cancer Brother     breast cancer  . Heart attack Neg Hx   . Hypertension Neg Hx       Prior to Admission medications   Medication Sig Start Date End Date Taking? Authorizing Provider  albuterol (PROVENTIL HFA;VENTOLIN HFA) 108 (90 BASE) MCG/ACT inhaler Inhale 2 puffs into the lungs every 4 (four) hours as needed for wheezing or shortness of breath. 07/16/14  Yes Tammy S Parrett, NP  albuterol (PROVENTIL) (2.5 MG/3ML) 0.083% nebulizer solution Take 2.5 mg by nebulization every 6 (six) hours as needed for wheezing.   Yes Historical Provider, MD  ALPRAZolam (XANAX) 0.25 MG tablet Take 1 tablet (0.25 mg total) by mouth at bedtime as needed for anxiety. 11/23/14  Yes Leroy Sea, MD  Amino Acids-Protein Hydrolys (FEEDING SUPPLEMENT, PRO-STAT SUGAR FREE 64,) LIQD Take 30 mLs by mouth daily. 03/26/15  Yes Palma Holter, MD  aspirin EC 81 MG tablet Take 81 mg by mouth daily.   Yes Historical Provider, MD  atorvastatin (LIPITOR) 40 MG tablet Take 0.5 tablets (20 mg total) by mouth daily at 6 PM. 11/07/13  Yes Rosalio Macadamia, NP  digoxin (LANOXIN) 0.125 MG tablet Take 1 tablet (0.125 mg total) by mouth daily. 04/01/15  Yes Lars Masson, MD  diltiazem (CARDIZEM CD) 240 MG 24 hr capsule Take 1 capsule (240 mg total) by mouth  daily. 03/28/15  Yes Palma Holter, MD  feeding supplement, ENSURE ENLIVE, (ENSURE ENLIVE) LIQD Take 237 mLs by mouth daily. 03/26/15  Yes Palma Holter, MD  furosemide (LASIX) 40 MG tablet Take 1 tablet (40 mg total) by mouth daily. 03/28/15  Yes Palma Holter, MD  insulin aspart (NOVOLOG) 100 UNIT/ML injection Before each meal 3 times a day, 140-199 - 2 units, 200-250 - 4 units, 251-299 - 6 units,  300-349 - 8 units,  350 or above 10 units. Dispense syringes and needles as needed, Ok to switch to PEN if approved. Substitute to any brand approved. DX DM2, Code E11.65 11/23/14  Yes Leroy Sea, MD  insulin  glargine (LANTUS) 100 UNIT/ML injection Inject 0.1 mLs (10 Units total) into the skin at bedtime. 03/26/15  Yes Palma Holter, MD  magnesium oxide (MAG-OX) 400 MG tablet Take 400 mg by mouth daily.   Yes Historical Provider, MD  metoprolol succinate (TOPROL-XL) 25 MG 24 hr tablet Take 1 tablet (25 mg total) by mouth daily. 12/26/14  Yes Leone Brand, NP  nitroGLYCERIN (NITROSTAT) 0.4 MG SL tablet Place 0.4 mg under the tongue every 5 (five) minutes as needed for chest pain.   Yes Historical Provider, MD  ondansetron (ZOFRAN) 8 MG tablet Take 1 tablet (8 mg total) by mouth every 8 (eight) hours as needed for nausea or vomiting. 10/28/14  Yes Mancel Bale, MD  pantoprazole (PROTONIX) 40 MG tablet Take 1 tablet (40 mg total) by mouth daily. 12/26/14  Yes Leone Brand, NP  polyethylene glycol (MIRALAX / GLYCOLAX) packet Take 17 g by mouth daily as needed for mild constipation or moderate constipation.    Yes Historical Provider, MD  potassium chloride SA (K-DUR,KLOR-CON) 20 MEQ tablet Take 1 tablet (20 mEq total) by mouth daily. 03/28/15  Yes Palma Holter, MD  senna (SENOKOT) 8.6 MG TABS tablet Take 8.6 mg by mouth daily as needed for mild constipation or moderate constipation.    Yes Historical Provider, MD  SPIRIVA HANDIHALER 18 MCG inhalation capsule INHALE THE CONTENTS  OF 1 CAPSULE DAILY 03/07/15  Yes Oretha Milch, MD  SYMBICORT 160-4.5 MCG/ACT inhaler USE 2 INHALATIONS TWICE A DAY 11/19/14  Yes Oretha Milch, MD  OXYGEN Inhale into the lungs. 2L continously    Historical Provider, MD  predniSONE (DELTASONE) 5 MG tablet Please take 10mg  for 3 days (starting 12/5), then take 5mg  daily for 3 days Patient not taking: Reported on 05/01/2015 04/01/15   Palma Holter, MD    Physical Exam: Filed Vitals:   05/01/15 0230 05/01/15 0245 05/01/15 0300 05/01/15 0337  BP: 132/71 118/66 138/72 145/78  Pulse: 94 88 77 83  Temp:    98.3 F (36.8 C)  TempSrc:    Oral  Resp: 21 22 21 20   Height:    5\' 10"  (1.778 m)  Weight:    76.3 kg (168 lb 3.4 oz)  SpO2: 100% 100% 98% 97%     General:  Moderately built and nourished.  Eyes: Anicteric. No pallor.  ENT: No discharge from the ears eyes nose and mouth.  Neck: No JVD appreciated. No mass felt.  Cardiovascular: S1 and S2 heard.  Respiratory: Bilateral expiratory wheeze and no crepitations.  Abdomen: Soft nontender bowel sounds present.  Skin: No rash.  Musculoskeletal: No edema.  Psychiatric: Alert awake oriented to her name and place.  Neurologic: Alert awake oriented to name place. Moves all extremities.  Labs on Admission:  Basic Metabolic Panel:  Recent Labs Lab 05/01/15 0050  NA 137  K 3.7  CL 96*  CO2 30  GLUCOSE 106*  BUN 18  CREATININE 1.42*  CALCIUM 10.0  MG 1.9   Liver Function Tests:  Recent Labs Lab 05/01/15 0050  AST 17  ALT 13*  ALKPHOS 77  BILITOT 1.1  PROT 5.7*  ALBUMIN 3.0*    Recent Labs Lab 05/01/15 0050  LIPASE 33    Recent Labs Lab 05/01/15 0051  AMMONIA 10   CBC:  Recent Labs Lab 05/01/15 0050  WBC 10.7*  NEUTROABS 8.6*  HGB 10.0*  HCT 31.3*  MCV 90.5  PLT 283   Cardiac Enzymes: No results  for input(s): CKTOTAL, CKMB, CKMBINDEX, TROPONINI in the last 168 hours.  BNP (last 3 results)  Recent Labs  11/17/14 2223 03/18/15 1605   BNP 179.0* 153.9*    ProBNP (last 3 results) No results for input(s): PROBNP in the last 8760 hours.  CBG: No results for input(s): GLUCAP in the last 168 hours.  Radiological Exams on Admission: Dg Chest 2 View  05/01/2015  CLINICAL DATA:  Altered mental status, cough EXAM: CHEST  2 VIEW COMPARISON:  03/24/2015 FINDINGS: Sternotomy wires overlie normal cardiac silhouette. Lungs are hyperinflated. No effusion, infiltrate, pneumothorax. IMPRESSION: Hyperinflated lungs.  No acute findings. Electronically Signed   By: Genevive BiStewart  Edmunds M.D.   On: 05/01/2015 00:25   Ct Head Wo Contrast  05/01/2015  CLINICAL DATA:  Altered mental status.  , fall EXAM: CT HEAD WITHOUT CONTRAST TECHNIQUE: Contiguous axial images were obtained from the base of the skull through the vertex without intravenous contrast. COMPARISON:  Head CT 01/06/2007 FINDINGS: No intracranial hemorrhage. No parenchymal contusion. No midline shift or mass effect. Basilar cisterns are patent. No skull base fracture. No fluid in the paranasal sinuses or mastoid air cells. Orbits are normal. Mucosal thickening in the maxillary sinuses. Periventricular white matter hypodensity. Lucent lesion in the occipital bone LEFT of midline is unchanged from 2008. IMPRESSION: 1. No acute intracranial findings. 2. No evidence trauma. 3. White matter microvascular disease. 4. Sinus inflammation. Electronically Signed   By: Genevive BiStewart  Edmunds M.D.   On: 05/01/2015 00:29    EKG: Independently reviewed. A. fib is rate controlled.  Assessment/Plan Principal Problem:   Acute encephalopathy Active Problems:   Hypertension   Hx of CABG   COPD exacerbation (HCC)   Chronic diastolic congestive heart failure (HCC)   1. Acute encephalopathy - patient is afebrile. Possible UTI. MRI is pending. Check digoxin level. Patient has been empirically placed on Levaquin. Follow urine cultures. 2. COPD exacerbation - on exam patient has diffuse wheezing. Patient has been  placed on Pulmicort and nebulizer. Patient is also on Levaquin. If wheezing continues may need steroids. 3. Chronic atrial fibrillation not on anticoagulation secondary to GI bleed - continue Cardizem metoprolol and digoxin. Digoxin levels are pending. 4. Diastolic CHF - appears compensated continue Lasix. Due to shortness of breath which probably is from COPD I have also ordered 1 dose of Lasix IV. Closely follow intake and output and metabolic panel and daily weights. 5. Diabetes mellitus type 2 - continue insulin with sliding scale coverage. 6. Hypertension - continue home medications. 7. Chronic anemia - follow CBC.   DVT Prophylaxis Lovenox.  Code Status: Full code.  Family Communication: Patient's grandson.  Disposition Plan: Admit to inpatient.    Jlon Betker N. [facility] Pager [phone].  If 7PM-7AM, please contact night-coverage [email or web address] Password [email or web address] 05/01/2015, 6:21 AM

## 2015-05-01 NOTE — ED Notes (Signed)
Patient transported to X-ray 

## 2015-05-01 NOTE — Progress Notes (Signed)
Patient went down for MRI. This RN received call about MRI being unsuccessful due to patient's o2 desaturation and feeling short of breath while laying flat. On call NP notified. RN will continue to monitor.  Veatrice KellsMahmoud,Dois Juarbe I, RN

## 2015-05-01 NOTE — Progress Notes (Signed)
Patient arrived on unit via stretcher with RN. Patient alert and oriented x2. Patient oriented to staff, room and unit. Patient placed on 2L of oxygen, o2 saturation: 97%. Patient place on telemetry, CCMD notified. Skin assessment completed, check flowsheets. Patient denies pain. Patient's IV clean, dry and intact. Call light has been placed within reach and bed alarm has been activated.RN awaiting orders. RN will continue to monitor the patient.   Rivka BarbaraZenab Jashan Cotten BSN, RN  Phone Number: [phone]

## 2015-05-01 NOTE — Clinical Social Work Note (Deleted)
Patient discharging home today and bus pass requested and provided to patient's nurse.    Genelle BalVanessa Tahja Liao, MSW, LCSW Licensed Clinical Social Worker Clinical Social Work Department [facility] [phone]

## 2015-05-01 NOTE — ED Notes (Signed)
Pt back from XR and CT. Phlebotomist at the bedside.

## 2015-05-01 NOTE — Progress Notes (Signed)
ANTIBIOTIC CONSULT NOTE - INITIAL  Pharmacy Consult for Levaquin  Indication: COPD exacerbation  Allergies  Allergen Reactions  . Other Other (See Comments)    TETANUS-can only take 1/2 dose at one time, can take the other 1/2 dose about 3 days later  . Codeine Nausea And Vomiting  . Sulfonamide Derivatives Nausea And Vomiting  . Warfarin     H/o AVM's prechief complaintcluding anticoagulation  . Penicillins Itching, Rash and Other (See Comments)    Nasal itching   . Ranitidine Hcl Itching and Rash    Patient Measurements: Height: 5\' 10"  (177.8 cm) Weight: 168 lb 3.4 oz (76.3 kg) IBW/kg (Calculated) : 68.5 Vital Signs: Temp: 98.3 F (36.8 C) (01/04 0337) Temp Source: Oral (01/04 0337) BP: 145/78 mmHg (01/04 0337) Pulse Rate: 83 (01/04 0337)  Labs:  Recent Labs  05/01/15 0050  WBC 10.7*  HGB 10.0*  PLT 283  CREATININE 1.42*   Estimated Creatinine Clearance: 32.5 mL/min (by C-G formula based on Cr of 1.42).  Medical History: Past Medical History  Diagnosis Date  . CAD (coronary artery disease)     a. s/p CABG x5 (2005) LIMA->LAD, rSVG->diag, seq r SVG -> 1st OM and distal LCX, rSVG->PDA b. s/p BMS-prox SVG-D2 (2014)  . Hypertension   . Hyperlipidemia   . GI bleed 2008    AVMs  . Aneurysm, aortic (HCC)     thoracic aorta, stable at 4.1 cm, chest CT, May, 2012  . Syncope     Nitroglycerin plus a diuretic, April, 2009  . Hyponatremia     Chronic. Felt secondary to SIADH   . COPD (chronic obstructive pulmonary disease) (HCC)     on home oxygen  . Tobacco abuse   . Bradycardia   . Carotid artery disease (HCC)     Hx of endarterectomy. Doppler October, 2011, stable, 0-39% RIC A., 40-59% LICA  . Tuberculosis     Exposures to tuberculosis 1970s, has tested negative by the health Department  . Chronic atrial fibrillation (HCC)     Not felt to be an anticoagulation candidate secondary to hx of GIB/AVM  . Venous stasis of lower extremity     Chronic  . CHF with  left ventricular diastolic dysfunction, NYHA class 2 (HCC)     a. EF 55-60%, echo, April, 2013 b. EF 55-60%, mild biatrial enlargement and PASP 42 mmH  . AAA (abdominal aortic aneurysm) (HCC)   . Complication of anesthesia   . Hx of CABG     CABG 2005  . Hematoma     Right groin hematoma, small, post cath, April, 2014  . Mitral regurgitation     Mild, hospital, March, 2014  . Leg ulcer (HCC)     Ulcerated lesion on the anterior aspect of her left lower leg, June, 2014  . Type 2 diabetes mellitus (HCC)    Assessment: Levaquin for COPD exacerbation, WBC WNL, mild bump in Scr, other labs reviewed.   Plan:  -Levaquin 750 mg IV q48h -F/U infectious work-up  Abran DukeLedford, Zykeem Bauserman 05/01/2015,6:26 AM

## 2015-05-02 DIAGNOSIS — G9340 Encephalopathy, unspecified: Secondary | ICD-10-CM

## 2015-05-02 DIAGNOSIS — Z951 Presence of aortocoronary bypass graft: Secondary | ICD-10-CM

## 2015-05-02 DIAGNOSIS — E118 Type 2 diabetes mellitus with unspecified complications: Secondary | ICD-10-CM

## 2015-05-02 DIAGNOSIS — J441 Chronic obstructive pulmonary disease with (acute) exacerbation: Secondary | ICD-10-CM

## 2015-05-02 DIAGNOSIS — I5032 Chronic diastolic (congestive) heart failure: Secondary | ICD-10-CM

## 2015-05-02 DIAGNOSIS — I10 Essential (primary) hypertension: Secondary | ICD-10-CM

## 2015-05-02 DIAGNOSIS — Z794 Long term (current) use of insulin: Secondary | ICD-10-CM

## 2015-05-02 LAB — CBC
HCT: 30.8 % — ABNORMAL LOW (ref 36.0–46.0)
HEMOGLOBIN: 10.1 g/dL — AB (ref 12.0–15.0)
MCH: 29.9 pg (ref 26.0–34.0)
MCHC: 32.8 g/dL (ref 30.0–36.0)
MCV: 91.1 fL (ref 78.0–100.0)
PLATELETS: 252 10*3/uL (ref 150–400)
RBC: 3.38 MIL/uL — AB (ref 3.87–5.11)
RDW: 15.2 % (ref 11.5–15.5)
WBC: 10.6 10*3/uL — ABNORMAL HIGH (ref 4.0–10.5)

## 2015-05-02 LAB — URINE CULTURE: CULTURE: NO GROWTH

## 2015-05-02 LAB — BASIC METABOLIC PANEL
ANION GAP: 11 (ref 5–15)
BUN: 20 mg/dL (ref 6–20)
CALCIUM: 9.5 mg/dL (ref 8.9–10.3)
CO2: 30 mmol/L (ref 22–32)
CREATININE: 1.16 mg/dL — AB (ref 0.44–1.00)
Chloride: 98 mmol/L — ABNORMAL LOW (ref 101–111)
GFR, EST AFRICAN AMERICAN: 49 mL/min — AB (ref >60)
GFR, EST NON AFRICAN AMERICAN: 42 mL/min — AB (ref >60)
Glucose, Bld: 183 mg/dL — ABNORMAL HIGH (ref 65–99)
Potassium: 4.4 mmol/L (ref 3.5–5.1)
Sodium: 139 mmol/L (ref 135–145)

## 2015-05-02 LAB — GLUCOSE, CAPILLARY
GLUCOSE-CAPILLARY: 182 mg/dL — AB (ref 65–99)
GLUCOSE-CAPILLARY: 206 mg/dL — AB (ref 65–99)
GLUCOSE-CAPILLARY: 160 mg/dL — AB (ref 65–99)
Glucose-Capillary: 235 mg/dL — ABNORMAL HIGH (ref 65–99)

## 2015-05-02 MED ORDER — IPRATROPIUM-ALBUTEROL 0.5-2.5 (3) MG/3ML IN SOLN
3.0000 mL | Freq: Two times a day (BID) | RESPIRATORY_TRACT | Status: DC
  Administered 2015-05-02 – 2015-05-06 (×8): 3 mL via RESPIRATORY_TRACT
  Filled 2015-05-02 (×8): qty 3

## 2015-05-02 MED ORDER — PREDNISONE 50 MG PO TABS
50.0000 mg | ORAL_TABLET | Freq: Every day | ORAL | Status: DC
  Administered 2015-05-03 – 2015-05-06 (×4): 50 mg via ORAL
  Filled 2015-05-02 (×4): qty 1

## 2015-05-02 NOTE — Progress Notes (Signed)
[facility] Progress Note  Patient: [name] ZOX:[id number]   PCP: Kaleen Mask, MD DOB: April 01, 1930   DOA: 04/30/2015   DOS: 05/02/2015   Date of Service: the patient was seen and examined on 05/02/2015  Subjective: The patient mentions her breathing is getting better. Her mentation is also at her baseline. No nausea no vomiting. No chest pain. Nutrition: Able to tolerate oral diet Activity: Ambulating in the room Last BM: 04/30/2015  Assessment and Plan: 1. Acute encephalopathy Patient presented with confusion., CT head was unremarkable. UDS was positive only for benzodiazepine, negative ammonia, negative alcohol, negative Tylenol and salicylate level. Urine did show some pyuria culture pending. Currently the patient is at her baseline. Most likely in the setting of hypoxia. Continue close monitoring. No evidence of focal deficit.  2. COPD extubation. Probable acute bronchitis. Transition her salmeterol to prednisone oral. Continue with Keflex. Continue with DuoNeb's and budesonide nebulizer.  3. Coronary disease, history of CABG. Continue with 81 mg aspirin.  4. History of chronic diastolic dysfunction Chronic afibrillation. Not a candidate for decannulation due to GI bleeding. On 81 mg aspirin. Continue with oral Lasix. Continue Lipitor, digoxin, Cardizem.   5. Diabetes mellitus.  Last lipid and A1c 5.7 in December 2016. Continuing home insulin regimen with sliding scale.  DVT Prophylaxis: subcutaneous Heparin Nutrition: Cardiac and diabetic diet Advance goals of care discussion: Full code  Brief Summary of Hospitalization:  HPI: As per the H and P dictated on admission, "[name] is a 82 y.o. female with history of CAD status post CABG, diastolic CHF, COPD, GI bleed, chronic atrial fibrillation was brought to the ER after patient was found to be having increasing confusion and agitation since yesterday morning. In the ER patient is found to be  confused but afebrile. CT of the head did not show any acute. As per the family if patient gets UTI patient usually gets confused. UA shows W BCs and leukocyte esterase positive but also some squamous cells. On exam patient does have diffuse wheezing. Patient has been admitted for acute encephalopathy probably from UTI. MRI brain is pending. In addition patient is also having COPD exacerbation. " Daily update, Procedures: Admitted on 05/01/2015, Consultants: None Antibiotics: Anti-infectives    Start     Dose/Rate Route Frequency Ordered Stop   05/01/15 1115  cephALEXin (KEFLEX) capsule 500 mg     500 mg Oral Every 8 hours 05/01/15 1058     05/01/15 0630  levofloxacin (LEVAQUIN) IVPB 750 mg  Status:  Discontinued     750 mg 100 mL/hr over 90 Minutes Intravenous Every 48 hours 05/01/15 0628 05/01/15 1058       Family Communication: family was present at bedside, at the time of interview.  Opportunity was given to ask question and all questions were answered satisfactorily.   Disposition:  Expected discharge date: 05/04/2015 Barriers to safe discharge: Improvement in shortness of breath   Intake/Output Summary (Last 24 hours) at 05/02/15 1455 Last data filed at 05/02/15 1448  Gross per 24 hour  Intake   1883 ml  Output      0 ml  Net   1883 ml   Filed Weights   04/30/15 2344 05/01/15 0337 05/01/15 2117  Weight: 74.844 kg (165 lb) 76.3 kg (168 lb 3.4 oz) 78.3 kg (172 lb 9.9 oz)    Objective: Physical Exam: Filed Vitals:   05/01/15 2117 05/01/15 2142 05/02/15 0636 05/02/15 1009  BP: 146/68  120/51 118/53  Pulse: 87  74 73  Temp: 98.3 F (36.8 C)  97.6 F (36.4 C) 97.4 F (36.3 C)  TempSrc: Oral  Oral Oral  Resp: 20  20 18   Height:      Weight: 78.3 kg (172 lb 9.9 oz)     SpO2: 96% 97% 97% 98%     General: Appear in mild distress, no Rash; Oral Mucosa moist. Cardiovascular: S1 and S2 Present, no Murmur, no JVD Respiratory: Bilateral Air entry present and Clear to  Auscultation, no Crackles, occasional wheezes Abdomen: Bowel Sound present, Soft and no tenderness Extremities: no Pedal edema, no calf tenderness Neurology: Grossly no focal neuro deficit.  Data Reviewed: CBC:  Recent Labs Lab 05/01/15 0050 05/01/15 0755 05/02/15 0433  WBC 10.7* 7.5 10.6*  NEUTROABS 8.6*  --   --   HGB 10.0* 9.2* 10.1*  HCT 31.3* 30.0* 30.8*  MCV 90.5 90.6 91.1  PLT 283 254 252   Basic Metabolic Panel:  Recent Labs Lab 05/01/15 0050 05/01/15 0755 05/02/15 0433  NA 137 139 139  K 3.7 3.6 4.4  CL 96* 99* 98*  CO2 30 32 30  GLUCOSE 106* 100* 183*  BUN 18 16 20   CREATININE 1.42* 1.18* 1.16*  CALCIUM 10.0 9.6 9.5  MG 1.9  --   --    Liver Function Tests:  Recent Labs Lab 05/01/15 0050 05/01/15 0755  AST 17 15  ALT 13* 12*  ALKPHOS 77 73  BILITOT 1.1 0.8  PROT 5.7* 5.2*  ALBUMIN 3.0* 2.7*    Recent Labs Lab 05/01/15 0050  LIPASE 33    Recent Labs Lab 05/01/15 0051  AMMONIA 10    Cardiac Enzymes: No results for input(s): CKTOTAL, CKMB, CKMBINDEX, TROPONINI in the last 168 hours.  BNP (last 3 results)  Recent Labs  11/17/14 2223 03/18/15 1605  BNP 179.0* 153.9*    CBG:  Recent Labs Lab 05/01/15 1159 05/01/15 1654 05/01/15 2112 05/02/15 0748 05/02/15 1139  GLUCAP 106* 99 202* 182* 206*    Recent Results (from the past 240 hour(s))  Culture, blood (Routine X 2) w Reflex to ID Panel     Status: None (Preliminary result)   Collection Time: 05/01/15 12:35 AM  Result Value Ref Range Status   Specimen Description BLOOD RIGHT ARM  Final   Special Requests AEROBIC BOTTLE ONLY  Final   Culture NO GROWTH 1 DAY  Final   Report Status PENDING  Incomplete  Culture, blood (Routine X 2) w Reflex to ID Panel     Status: None (Preliminary result)   Collection Time: 05/01/15 12:40 AM  Result Value Ref Range Status   Specimen Description BLOOD RIGHT ARM  Final   Special Requests AEROBIC BOTTLE ONLY  Final   Culture NO  GROWTH 1 DAY  Final   Report Status PENDING  Incomplete  Urine culture     Status: None   Collection Time: 05/01/15  1:04 AM  Result Value Ref Range Status   Specimen Description URINE, CATHETERIZED  Final   Special Requests NONE  Final   Culture NO GROWTH 1 DAY  Final   Report Status 05/02/2015 FINAL  Final     Studies: No results found.   Scheduled Meds: . antiseptic oral rinse  7 mL Mouth Rinse BID  . aspirin EC  81 mg Oral Daily  . atorvastatin  20 mg Oral q1800  . budesonide (PULMICORT) nebulizer solution  0.25 mg Nebulization BID  . cephALEXin  500 mg Oral Q8H  .  digoxin  0.125 mg Oral Daily  . diltiazem  240 mg Oral Daily  . enoxaparin (LOVENOX) injection  40 mg Subcutaneous Q24H  . feeding supplement (ENSURE ENLIVE)  237 mL Oral Q24H  . feeding supplement (PRO-STAT SUGAR FREE 64)  30 mL Oral Daily  . furosemide  40 mg Oral Daily  . insulin aspart  0-9 Units Subcutaneous TID WC  . insulin glargine  10 Units Subcutaneous QHS  . ipratropium-albuterol  3 mL Nebulization BID  . magnesium oxide  400 mg Oral Daily  . metoprolol succinate  25 mg Oral Daily  . pantoprazole  40 mg Oral Daily  . potassium chloride SA  20 mEq Oral Daily  . [START ON 05/03/2015] predniSONE  50 mg Oral Q breakfast   Continuous Infusions:  PRN Meds: albuterol, ALPRAZolam, nitroGLYCERIN, ondansetron, polyethylene glycol, senna  Time spent: 30 minutes  Author: Lynden OxfordPranav Atalie Oros, MD [facility] Pager: [phone] 05/02/2015 2:55 PM  If 7PM-7AM, please contact night-coverage at [email or web address], password [email or web address]

## 2015-05-02 NOTE — Progress Notes (Signed)
[facility] PLAN OF CARE NOTE  Patient: [name]   ZOX:[id number]  PCP: [name],[name], MD DOB: 29-Feb-1930  DOA: 04/30/2015    DOS: 05/01/2015   Patient was admitted by my colleague Toniann Failkakrakandy earlier on 05/01/2015. I have reviewed the H&P as well as assessment and plan and agree with the same, other than Important changes which are listed below.  Plan of care: Changing antibiotic to Keflex, changing steroids to Solu-Medrol  Author: Lynden OxfordPranav Jin Shockley, MD [facility] Pager: [phone] 05/01/2015 12:02 PM    If 7PM-7AM, please contact night-coverage at [email or web address], password [email or web address]

## 2015-05-03 LAB — BASIC METABOLIC PANEL
ANION GAP: 8 (ref 5–15)
BUN: 25 mg/dL — ABNORMAL HIGH (ref 6–20)
CALCIUM: 9.5 mg/dL (ref 8.9–10.3)
CO2: 31 mmol/L (ref 22–32)
CREATININE: 1.35 mg/dL — AB (ref 0.44–1.00)
Chloride: 94 mmol/L — ABNORMAL LOW (ref 101–111)
GFR calc non Af Amer: 35 mL/min — ABNORMAL LOW (ref >60)
GFR, EST AFRICAN AMERICAN: 41 mL/min — AB (ref >60)
Glucose, Bld: 172 mg/dL — ABNORMAL HIGH (ref 65–99)
Potassium: 4.3 mmol/L (ref 3.5–5.1)
SODIUM: 133 mmol/L — AB (ref 135–145)

## 2015-05-03 LAB — GLUCOSE, CAPILLARY
GLUCOSE-CAPILLARY: 160 mg/dL — AB (ref 65–99)
GLUCOSE-CAPILLARY: 158 mg/dL — AB (ref 65–99)
Glucose-Capillary: 239 mg/dL — ABNORMAL HIGH (ref 65–99)
Glucose-Capillary: 183 mg/dL — ABNORMAL HIGH (ref 65–99)

## 2015-05-03 LAB — CBC
HEMATOCRIT: 30.3 % — AB (ref 36.0–46.0)
HEMOGLOBIN: 9.4 g/dL — AB (ref 12.0–15.0)
MCH: 28.1 pg (ref 26.0–34.0)
MCHC: 31 g/dL (ref 30.0–36.0)
MCV: 90.7 fL (ref 78.0–100.0)
Platelets: 257 10*3/uL (ref 150–400)
RBC: 3.34 MIL/uL — ABNORMAL LOW (ref 3.87–5.11)
RDW: 15.2 % (ref 11.5–15.5)
WBC: 15.6 10*3/uL — AB (ref 4.0–10.5)

## 2015-05-03 MED ORDER — PREDNISONE 10 MG PO TABS: ORAL_TABLET | ORAL | Status: DC

## 2015-05-03 MED ORDER — CEPHALEXIN 500 MG PO CAPS: 500.0000 mg | ORAL_CAPSULE | Freq: Three times a day (TID) | ORAL | Status: DC

## 2015-05-03 NOTE — Progress Notes (Signed)
[facility] Progress Note  Patient: [name] ZOX:[id number]   PCP: [name],Karen OLIVER, MD DOB: Nov 13, 1929   DOA: 04/30/2015   DOS: 05/03/2015   Date of Service: the patient was seen and examined on 05/03/2015  Subjective: Patient complains of fatigue and tiredness. Denies having any shortness of breath. No nausea no vomiting. Appetite is poor. Nutrition: Able to tolerate oral diet Activity: Ambulating in the room Last BM: 04/30/2015  Assessment and Plan: 1. Acute encephalopathy Patient presented with confusion., CT head was unremarkable. UDS was positive only for benzodiazepine, negative ammonia, negative alcohol, negative Tylenol and salicylate level. Urine did show some pyuria culture shows no growth. Currently the patient is at her baseline. Most likely in the setting of hypoxia. Continue close monitoring. No evidence of focal deficit.  2. COPD exacerbation. Probable acute bronchitis. Transition her Solu-Medrol to prednisone oral. Continue with Keflex. Continue with DuoNeb's and budesonide nebulizer.  3. Coronary disease, history of CABG. Continue with 81 mg aspirin.  4. History of chronic diastolic dysfunction Chronic atrial fibrillation. Not a candidate for anticoagulation due to GI bleeding. On 81 mg aspirin. Continue with oral Lasix. Continue Lipitor, digoxin, Cardizem.  5. Diabetes mellitus.  Last lipid and A1c 5.7 in December 2016. Continuing home insulin regimen with sliding scale.  6. Generalized fatigue and deconditioning. Patient with recurrent hospitalization with inability to get home health after her recent hospitalization. Currently physical therapy recommends SNF. Will consult social worker.  DVT Prophylaxis: subcutaneous Heparin Nutrition: Cardiac and diabetic diet Advance goals of care discussion: Full code  Brief Summary of Hospitalization:  HPI: As per the H and P dictated on admission, "[name] is a 82 y.o. female with history of  CAD status post CABG, diastolic CHF, COPD, GI bleed, chronic atrial fibrillation was brought to the ER after patient was found to be having increasing confusion and agitation since yesterday morning. In the ER patient is found to be confused but afebrile. CT of the head did not show any acute. As per the family if patient gets UTI patient usually gets confused. UA shows W BCs and leukocyte esterase positive but also some squamous cells. On exam patient does have diffuse wheezing. Patient has been admitted for acute encephalopathy probably from UTI. MRI brain is pending. In addition patient is also having COPD exacerbation. " Daily update, Procedures: Admitted on 05/01/2015, Consultants: None Antibiotics: Anti-infectives    Start     Dose/Rate Route Frequency Ordered Stop   05/03/15 0000  cephALEXin (KEFLEX) 500 MG capsule     500 mg Oral Every 8 hours 05/03/15 1012 05/06/15 2359   05/01/15 1115  cephALEXin (KEFLEX) capsule 500 mg     500 mg Oral Every 8 hours 05/01/15 1058     05/01/15 0630  levofloxacin (LEVAQUIN) IVPB 750 mg  Status:  Discontinued     750 mg 100 mL/hr over 90 Minutes Intravenous Every 48 hours 05/01/15 0628 05/01/15 1058      Family Communication: no family was present at bedside, at the time of interview.   Disposition:  Expected discharge date: 05/04/2015 Barriers to safe discharge: Improvement in shortness of breath   Intake/Output Summary (Last 24 hours) at 05/03/15 1700 Last data filed at 05/03/15 1200  Gross per 24 hour  Intake    460 ml  Output   0.55 ml  Net 459.45 ml   Filed Weights   04/30/15 2344 05/01/15 0337 05/01/15 2117  Weight: 74.844 kg (165 lb) 76.3 kg (168 lb 3.4  oz) 78.3 kg (172 lb 9.9 oz)    Objective: Physical Exam: Filed Vitals:   05/03/15 0753 05/03/15 0814 05/03/15 1510 05/03/15 1512  BP:  130/58 108/55 109/55  Pulse:  95 81 75  Temp:  97.9 F (36.6 C) 97.8 F (36.6 C)   TempSrc:  Oral Oral   Resp:  18 18   Height:      Weight:       SpO2: 98% 95% 97%     General: Appear in mild distress, no Rash; Oral Mucosa moist. Cardiovascular: S1 and S2 Present, no Murmur, no JVD Respiratory: Bilateral Air entry present and Clear to Auscultation, no Crackles, occasional wheezes Abdomen: Bowel Sound present, Soft and no tenderness Extremities: no Pedal edema, no calf tenderness  Data Reviewed: CBC:  Recent Labs Lab 05/01/15 0050 05/01/15 0755 05/02/15 0433 05/03/15 0542  WBC 10.7* 7.5 10.6* 15.6*  NEUTROABS 8.6*  --   --   --   HGB 10.0* 9.2* 10.1* 9.4*  HCT 31.3* 30.0* 30.8* 30.3*  MCV 90.5 90.6 91.1 90.7  PLT 283 254 252 257   Basic Metabolic Panel:  Recent Labs Lab 05/01/15 0050 05/01/15 0755 05/02/15 0433 05/03/15 0542  NA 137 139 139 133*  K 3.7 3.6 4.4 4.3  CL 96* 99* 98* 94*  CO2 30 32 30 31  GLUCOSE 106* 100* 183* 172*  BUN 18 16 20  25*  CREATININE 1.42* 1.18* 1.16* 1.35*  CALCIUM 10.0 9.6 9.5 9.5  MG 1.9  --   --   --    Liver Function Tests:  Recent Labs Lab 05/01/15 0050 05/01/15 0755  AST 17 15  ALT 13* 12*  ALKPHOS 77 73  BILITOT 1.1 0.8  PROT 5.7* 5.2*  ALBUMIN 3.0* 2.7*    Recent Labs Lab 05/01/15 0050  LIPASE 33    Recent Labs Lab 05/01/15 0051  AMMONIA 10    Cardiac Enzymes: No results for input(s): CKTOTAL, CKMB, CKMBINDEX, TROPONINI in the last 168 hours.  BNP (last 3 results)  Recent Labs  11/17/14 2223 03/18/15 1605  BNP 179.0* 153.9*    CBG:  Recent Labs Lab 05/02/15 1636 05/02/15 2111 05/03/15 0755 05/03/15 1149 05/03/15 1625  GLUCAP 235* 160* 160* 158* 239*    Recent Results (from the past 240 hour(s))  Culture, blood (Routine X 2) w Reflex to ID Panel     Status: None (Preliminary result)   Collection Time: 05/01/15 12:35 AM  Result Value Ref Range Status   Specimen Description BLOOD RIGHT ARM  Final   Special Requests AEROBIC BOTTLE ONLY  Final   Culture NO GROWTH 2 DAYS  Final   Report Status PENDING  Incomplete  Culture,  blood (Routine X 2) w Reflex to ID Panel     Status: None (Preliminary result)   Collection Time: 05/01/15 12:40 AM  Result Value Ref Range Status   Specimen Description BLOOD RIGHT ARM  Final   Special Requests AEROBIC BOTTLE ONLY  Final   Culture NO GROWTH 2 DAYS  Final   Report Status PENDING  Incomplete  Urine culture     Status: None   Collection Time: 05/01/15  1:04 AM  Result Value Ref Range Status   Specimen Description URINE, CATHETERIZED  Final   Special Requests NONE  Final   Culture NO GROWTH 1 DAY  Final   Report Status 05/02/2015 FINAL  Final     Studies: No results found.   Scheduled Meds: . antiseptic oral rinse  7 mL Mouth Rinse BID  . aspirin EC  81 mg Oral Daily  . atorvastatin  20 mg Oral q1800  . budesonide (PULMICORT) nebulizer solution  0.25 mg Nebulization BID  . cephALEXin  500 mg Oral Q8H  . digoxin  0.125 mg Oral Daily  . diltiazem  240 mg Oral Daily  . enoxaparin (LOVENOX) injection  40 mg Subcutaneous Q24H  . feeding supplement (ENSURE ENLIVE)  237 mL Oral Q24H  . feeding supplement (PRO-STAT SUGAR FREE 64)  30 mL Oral Daily  . furosemide  40 mg Oral Daily  . insulin aspart  0-9 Units Subcutaneous TID WC  . insulin glargine  10 Units Subcutaneous QHS  . ipratropium-albuterol  3 mL Nebulization BID  . magnesium oxide  400 mg Oral Daily  . metoprolol succinate  25 mg Oral Daily  . pantoprazole  40 mg Oral Daily  . potassium chloride SA  20 mEq Oral Daily  . predniSONE  50 mg Oral Q breakfast   Continuous Infusions:  PRN Meds: albuterol, ALPRAZolam, nitroGLYCERIN, ondansetron, polyethylene glycol, senna  Time spent: 30 minutes  Author: Lynden Oxford, MD [facility] Pager: [phone] 05/03/2015 5:00 PM  If 7PM-7AM, please contact night-coverage at [email or web address], password [facility]

## 2015-05-03 NOTE — Evaluation (Signed)
Physical Therapy Evaluation Patient Details Name: [name] MRN: [id number] DOB: Sep 23, 1929 Today's Date: 05/03/2015   History of Present Illness  82 yo female with onset of acute encephalopathy and UTI, with Hx:  AAA, CABG, CAD, CHF, a-fib,   Clinical Impression  Pt is getting OOB to sit bedside but very poorly tolerating the activity.  Her pulses were 75-86 and sats were fine, at 94-97%.  Her plan is to try to get home but has no consistent help and is not demonstrating enough mobility to validate this.  Her grandson is out at work and will be evening help once there.    Follow Up Recommendations SNF    Equipment Recommendations  None recommended by PT    Recommendations for Other Services       Precautions / Restrictions Precautions Precautions: Fall (telemetry) Restrictions Weight Bearing Restrictions: No      Mobility  Bed Mobility Overal bed mobility: Needs Assistance Bed Mobility: Supine to Sit;Sit to Supine     Supine to sit: Min guard;Min assist Sit to supine: Min guard;Min assist      Transfers                 General transfer comment: declined to attempt  Ambulation/Gait             General Gait Details: declined  Stairs            Wheelchair Mobility    Modified Rankin (Stroke Patients Only)       Balance Overall balance assessment: Needs assistance Sitting-balance support: Feet supported Sitting balance-Leahy Scale: Fair Sitting balance - Comments: fair but supervised due to pt feeling bad in sitting                                     Pertinent Vitals/Pain Pain Assessment: No/denies pain    Home Living Family/patient expects to be discharged to:: Private residence Living Arrangements: Other relatives (grandson in the evening) Available Help at Discharge: Family;Available PRN/intermittently Type of Home: House Home Access: Ramped entrance     Home Layout: One level Home Equipment: Cane - single  point;Hospital bed;Walker - 2 wheels;Walker - standard;Walker - 4 wheels;Bedside commode;Shower seat;Grab bars - tub/shower;Other (comment) Additional Comments: daughter in law does pt errands and cleaning.      Prior Function Level of Independence: Independent with assistive device(s)         Comments: has Rollator and daughter helps with ADL's     Hand Dominance   Dominant Hand: Right    Extremity/Trunk Assessment   Upper Extremity Assessment: Generalized weakness           Lower Extremity Assessment: Generalized weakness      Cervical / Trunk Assessment: Normal  Communication   Communication: No difficulties  Cognition Arousal/Alertness: Awake/alert Behavior During Therapy: WFL for tasks assessed/performed Overall Cognitive Status: Within Functional Limits for tasks assessed                      General Comments General comments (skin integrity, edema, etc.): Pt was checked for sats and pulses with no altered values that were a problem.      Exercises        Assessment/Plan    PT Assessment Patient needs continued PT services  PT Diagnosis Generalized weakness   PT Problem List Decreased strength;Decreased range of motion;Decreased activity tolerance;Decreased balance;Decreased mobility;Decreased coordination;Decreased knowledge  of use of DME;Decreased safety awareness;Cardiopulmonary status limiting activity;Obesity  PT Treatment Interventions DME instruction;Gait training;Functional mobility training;Therapeutic activities;Therapeutic exercise;Balance training;Neuromuscular re-education;Patient/family education   PT Goals (Current goals can be found in the Care Plan section) Acute Rehab PT Goals Patient Stated Goal: to feel better PT Goal Formulation: With patient Time For Goal Achievement: 05/17/15 Potential to Achieve Goals: Good    Frequency Min 3X/week   Barriers to discharge Decreased caregiver support      Co-evaluation                End of Session Equipment Utilized During Treatment: Oxygen Activity Tolerance: Patient limited by fatigue;Patient limited by lethargy Patient left: in bed;with call bell/phone within reach;with bed alarm set Nurse Communication: Mobility status         Time: [id number] PT Time Calculation (min) (ACUTE ONLY): 25 min   Charges:   PT Evaluation $PT Eval Moderate Complexity: 1 Procedure PT Treatments $Therapeutic Activity: 8-22 mins   PT G Codes:        Ivar DrapeStout, Denette Hass E 05/03/2015, 12:36 PM   Samul Dadauth Brooklyn Jeff, PT MS Acute Rehab Dept. Number: [facility] [id number] [phone] and [facility] [phone]

## 2015-05-03 NOTE — Care Management Important Message (Signed)
Important Message  Patient Details  Name: [name] MRN: [id number] Date of Birth: [birth year]   Medicare Important Message Given:  Yes    Ta Fair P [place] 05/03/2015, 2:34 PM

## 2015-05-03 NOTE — Progress Notes (Signed)
Shortness of breathing on sitting at the bedside while trying to eat. 97%@ 2 lpm.

## 2015-05-04 ENCOUNTER — Other Ambulatory Visit: Payer: Self-pay

## 2015-05-04 DIAGNOSIS — R001 Bradycardia, unspecified: Secondary | ICD-10-CM

## 2015-05-04 LAB — CBC WITH DIFFERENTIAL/PLATELET
Basophils Absolute: 0 10*3/uL (ref 0.0–0.1)
Basophils Relative: 0 %
EOS ABS: 0 10*3/uL (ref 0.0–0.7)
Eosinophils Relative: 0 %
HEMATOCRIT: 34.6 % — AB (ref 36.0–46.0)
HEMOGLOBIN: 10.8 g/dL — AB (ref 12.0–15.0)
LYMPHS ABS: 1.2 10*3/uL (ref 0.7–4.0)
LYMPHS PCT: 7 %
MCH: 28.4 pg (ref 26.0–34.0)
MCHC: 31.2 g/dL (ref 30.0–36.0)
MCV: 91.1 fL (ref 78.0–100.0)
MONOS PCT: 4 %
Monocytes Absolute: 0.6 10*3/uL (ref 0.1–1.0)
NEUTROS PCT: 89 %
Neutro Abs: 14.5 10*3/uL — ABNORMAL HIGH (ref 1.7–7.7)
Platelets: 294 10*3/uL (ref 150–400)
RBC: 3.8 MIL/uL — ABNORMAL LOW (ref 3.87–5.11)
RDW: 15.3 % (ref 11.5–15.5)
WBC: 16.2 10*3/uL — ABNORMAL HIGH (ref 4.0–10.5)

## 2015-05-04 LAB — MAGNESIUM: Magnesium: 2.6 mg/dL — ABNORMAL HIGH (ref 1.7–2.4)

## 2015-05-04 LAB — BASIC METABOLIC PANEL
Anion gap: 9 (ref 5–15)
BUN: 28 mg/dL — ABNORMAL HIGH (ref 6–20)
CHLORIDE: 96 mmol/L — AB (ref 101–111)
CO2: 32 mmol/L (ref 22–32)
CREATININE: 1.24 mg/dL — AB (ref 0.44–1.00)
Calcium: 10 mg/dL (ref 8.9–10.3)
GFR calc non Af Amer: 39 mL/min — ABNORMAL LOW (ref >60)
GFR, EST AFRICAN AMERICAN: 45 mL/min — AB (ref >60)
Glucose, Bld: 128 mg/dL — ABNORMAL HIGH (ref 65–99)
POTASSIUM: 5 mmol/L (ref 3.5–5.1)
Sodium: 137 mmol/L (ref 135–145)

## 2015-05-04 LAB — TROPONIN I
TROPONIN I: 0.07 ng/mL — AB (ref <0.031)
TROPONIN I: 0.05 ng/mL — AB (ref <0.031)
TROPONIN I: 0.06 ng/mL — AB (ref <0.031)

## 2015-05-04 LAB — GLUCOSE, CAPILLARY
GLUCOSE-CAPILLARY: 134 mg/dL — AB (ref 65–99)
GLUCOSE-CAPILLARY: 169 mg/dL — AB (ref 65–99)
Glucose-Capillary: 216 mg/dL — ABNORMAL HIGH (ref 65–99)
Glucose-Capillary: 151 mg/dL — ABNORMAL HIGH (ref 65–99)

## 2015-05-04 LAB — DIGOXIN LEVEL: Digoxin Level: 1.6 ng/mL (ref 0.8–2.0)

## 2015-05-04 LAB — LACTIC ACID, PLASMA: Lactic Acid, Venous: 1.7 mmol/L (ref 0.5–2.0)

## 2015-05-04 MED ORDER — ACETAMINOPHEN 325 MG PO TABS: 650.0000 mg | ORAL_TABLET | Freq: Four times a day (QID) | ORAL | Status: DC | PRN

## 2015-05-04 MED ORDER — DIGOXIN 125 MCG PO TABS
0.1250 mg | ORAL_TABLET | Freq: Every day | ORAL | Status: DC
  Administered 2015-05-05 – 2015-05-06 (×2): 0.125 mg via ORAL
  Filled 2015-05-04 (×2): qty 1

## 2015-05-04 NOTE — NC FL2 (Signed)
[place] MEDICAID FL2 LEVEL OF CARE SCREENING TOOL     IDENTIFICATION  Patient Name: [name] Birthdate: 02/12/1930 Sex: female Admission Date (Current Location): 04/30/2015  [facility] [place]  [employer]   Facility and Address:  [facility], [address], [place], [place] [address]      Provider Number: [id number]  Attending Physician Name and Address:  Rolly SalterPranav M Patel, MD  Relative Name and Phone Number:       Current Level of Care: Hospital Recommended Level of Care: Skilled Nursing Facility Prior Approval Number:    Date Approved/Denied:   PASRR Number:  [id number]   Discharge Plan: SNF    Current Diagnoses: Patient Active Problem List   Diagnosis Date Noted  . Altered mental status 05/01/2015  . Acute encephalopathy 05/01/2015  . AKI (acute kidney injury) (HCC)   . Coronary artery disease involving native coronary artery of native heart with angina pectoris (HCC)   . Chronic diastolic congestive heart failure (HCC)   . Insulin dependent type 2 diabetes mellitus (HCC)   . Acute respiratory failure with hypoxia (HCC)   . HCAP (healthcare-associated pneumonia) 03/18/2015  . Bile duct obstruction   . Elevated LFTs   . Elevated troponin 11/19/2014  . SOB (shortness of breath)   . Transaminitis 11/18/2014  . Pancreatic lesion 11/18/2014  . Sepsis (HCC) 05/04/2014  . UTI (lower urinary tract infection) 05/04/2014  . Atrial fibrillation with RVR (HCC) 05/04/2014  . Atrial fibrillation with rapid ventricular response (HCC)   . Pyelonephritis   . Bronchitis, chronic obstructive w acute bronchitis (HCC) 02/02/2014  . COPD exacerbation (HCC) 02/01/2014  . Dyspnea 11/15/2013  . Atrial fibrillation with tachycardic ventricular rate (HCC) 11/15/2013  . Peripheral arterial disease (HCC) 10/17/2013  . Atypical chest pain 02/08/2013  . Cellulitis, leg 09/28/2012  . Leg ulcer (HCC)   . CAD (coronary artery disease)   . Hematoma    . Mitral regurgitation   . Chronic diastolic CHF (congestive heart failure) (HCC) 07/20/2012  . Anemia 07/20/2012  . Intermediate coronary syndrome (HCC) 07/18/2012  . Acute exacerbation of chronic obstructive pulmonary disease (COPD) (HCC) 02/15/2012  . Dehydration 02/12/2012  . Alkalosis, metabolic 02/12/2012  . Generalized weakness 02/11/2012  . Atrial fibrillation (HCC)   . Chronic respiratory failure (HCC) 07/29/2011  . H/O steroid therapy 07/28/2011  . Edema   . Aortic valve sclerosis   . Hypertension   . Hyperlipidemia   . GI bleed   . Aneurysm, aortic (HCC)   . Syncope   . Hyponatremia   . COPD (chronic obstructive pulmonary disease) (HCC)   . Tobacco abuse   . Bradycardia   . Hx of CABG   . Ejection fraction   . Carotid artery disease (HCC)   . Tuberculosis   . OXYGEN-USE OF SUPPLEMENTAL 08/02/2009    Orientation RESPIRATION BLADDER Height & Weight    Self, Situation, Place  O2 (2L) Continent 5\' 10"  (177.8 cm) 172 lbs.  BEHAVIORAL SYMPTOMS/MOOD NEUROLOGICAL BOWEL NUTRITION STATUS      Continent Diet (Carb Modified)  AMBULATORY STATUS COMMUNICATION OF NEEDS Skin   Limited Assist Verbally Normal                       Personal Care Assistance Level of Assistance  Bathing, Feeding, Dressing Bathing Assistance: Limited assistance Feeding assistance: Independent Dressing Assistance: Limited assistance     Functional Limitations Info  Sight, Hearing, Speech Sight Info: Adequate Hearing  Info: Adequate Speech Info: Adequate    SPECIAL CARE FACTORS FREQUENCY  PT (By licensed PT), OT (By licensed OT)     PT Frequency: 3 OT Frequency: 3            Contractures Contractures Info: Not present    Additional Factors Info  Code Status, Allergies, Insulin Sliding Scale Code Status Info: Full Code Allergies Info: Other, Codeine, Sulfonamide Derivatives, Warfarin, Penicillins, Ranitidine Hcl   Insulin Sliding Scale Info: 3 times daily with meals        Current Medications (05/04/2015):  This is the current hospital active medication list Current Facility-Administered Medications  Medication Dose Route Frequency Provider Last Rate Last Dose  . acetaminophen (TYLENOL) tablet 650 mg  650 mg Oral Q6H PRN Rolly Salter, MD      . albuterol (PROVENTIL) (2.5 MG/3ML) 0.083% nebulizer solution 2.5 mg  2.5 mg Nebulization Q2H PRN Eduard Clos, MD      . ALPRAZolam Prudy Feeler) tablet 0.25 mg  0.25 mg Oral QHS PRN Eduard Clos, MD      . antiseptic oral rinse (CPC / CETYLPYRIDINIUM CHLORIDE 0.05%) solution 7 mL  7 mL Mouth Rinse BID Rolly Salter, MD   7 mL at 05/04/15 1111  . aspirin EC tablet 81 mg  81 mg Oral Daily Eduard Clos, MD   81 mg at 05/04/15 1111  . atorvastatin (LIPITOR) tablet 20 mg  20 mg Oral q1800 Eduard Clos, MD   20 mg at 05/03/15 1729  . budesonide (PULMICORT) nebulizer solution 0.25 mg  0.25 mg Nebulization BID Eduard Clos, MD   0.25 mg at 05/04/15 0944  . cephALEXin (KEFLEX) capsule 500 mg  500 mg Oral Q8H Rolly Salter, MD   500 mg at 05/04/15 1610  . digoxin (LANOXIN) tablet 0.125 mg  0.125 mg Oral Daily Eduard Clos, MD   0.125 mg at 05/04/15 1112  . diltiazem (CARDIZEM CD) 24 hr capsule 240 mg  240 mg Oral Daily Eduard Clos, MD   240 mg at 05/04/15 1111  . enoxaparin (LOVENOX) injection 40 mg  40 mg Subcutaneous Q24H Eduard Clos, MD   40 mg at 05/04/15 1110  . feeding supplement (ENSURE ENLIVE) (ENSURE ENLIVE) liquid 237 mL  237 mL Oral Q24H Eduard Clos, MD   237 mL at 05/03/15 1530  . feeding supplement (PRO-STAT SUGAR FREE 64) liquid 30 mL  30 mL Oral Daily Eduard Clos, MD   30 mL at 05/04/15 1113  . furosemide (LASIX) tablet 40 mg  40 mg Oral Daily Eduard Clos, MD   40 mg at 05/04/15 1112  . insulin aspart (novoLOG) injection 0-9 Units  0-9 Units Subcutaneous TID WC Eduard Clos, MD   1 Units at 05/04/15 [phone]  . insulin glargine (LANTUS)  injection 10 Units  10 Units Subcutaneous QHS Eduard Clos, MD   10 Units at 05/03/15 2126  . ipratropium-albuterol (DUONEB) 0.5-2.5 (3) MG/3ML nebulizer solution 3 mL  3 mL Nebulization BID Rolly Salter, MD   3 mL at 05/04/15 0945  . magnesium oxide (MAG-OX) tablet 400 mg  400 mg Oral Daily Eduard Clos, MD   400 mg at 05/04/15 1111  . metoprolol succinate (TOPROL-XL) 24 hr tablet 25 mg  25 mg Oral Daily Eduard Clos, MD   25 mg at 05/04/15 1112  . nitroGLYCERIN (NITROSTAT) SL tablet 0.4 mg  0.4 mg Sublingual Q5 min PRN Georgetta Haber  Demetra Shiner, MD      . ondansetron [facility]) tablet 8 mg  8 mg Oral Q8H PRN Eduard Clos, MD      . pantoprazole (PROTONIX) EC tablet 40 mg  40 mg Oral Daily Eduard Clos, MD   40 mg at 05/04/15 1112  . polyethylene glycol (MIRALAX / GLYCOLAX) packet 17 g  17 g Oral Daily PRN Eduard Clos, MD      . predniSONE (DELTASONE) tablet 50 mg  50 mg Oral Q breakfast Rolly Salter, MD   50 mg at 05/04/15 1610  . senna (SENOKOT) tablet 8.6 mg  8.6 mg Oral Daily PRN Eduard Clos, MD         Discharge Medications: Please see discharge summary for a list of discharge medications.  Relevant Imaging Results:  Relevant Lab Results:   Additional Information    Macario Golds, [place] [phone]

## 2015-05-04 NOTE — Progress Notes (Signed)
[facility] Progress Note  Patient: [name]:[id number]   PCP: Kaleen Mask, MD DOB: 1930/01/08   DOA: 04/30/2015   DOS: 05/04/2015   Date of Service: the patient was seen and examined on 05/04/2015  Subjective: Patient does not see any difference in her breathing, continues to complain of some fatigue. Earlier the patient had slow ventricular response in telemetry, as per my discussion with the telemetry attack the patient has been having these kind of episodes. Nutrition: Able to tolerate oral diet Activity: Ambulating in the room Last BM: 05/03/2015  Assessment and Plan: 1. Acute encephalopathy Patient presented with confusion., CT head was unremarkable. UDS was positive only for benzodiazepine, negative ammonia, negative alcohol, negative Tylenol and salicylate level. Urine did show some pyuria culture shows no growth. Currently the patient is at her baseline. Most likely in the setting of hypoxia. Continue close monitoring. No evidence of focal deficit.  2. COPD exacerbation. Probable acute bronchitis. Transition her Solu-Medrol to prednisone oral. Continue with Keflex. Continue with DuoNeb's and budesonide nebulizer.  3. Coronary disease, history of CABG. Continue with 81 mg aspirin.  4. History of chronic diastolic dysfunction Chronic atrial fibrillation. Slow ventricular response with bradycardia. Discontinued metoprolol, Cardizem, digoxin. Stat digoxin level. EKG shows atrial fibrillation. The bradycardia is most likely secondary to dual AV node blocking. If she does not improve after discontinuation of the medication cardiology will be consulted.  Not a candidate for anticoagulation due to GI bleeding. On 81 mg aspirin. Continue with oral Lasix. Continue Lipitor.  5. Diabetes mellitus.  Last lipid and A1c 5.7 in December 2016. Continuing home insulin regimen with sliding scale.  6. Generalized fatigue and deconditioning. Possibility of  bradycardia causing fatigue cannot be ruled out. Patient with recurrent hospitalization with inability to get home health after her recent hospitalization. Currently physical therapy recommends SNF. Will consult social worker.  DVT Prophylaxis: subcutaneous Heparin Nutrition: Cardiac and diabetic diet Advance goals of care discussion: Full code  Brief Summary of Hospitalization:  HPI: As per the H and P dictated on admission, "[name] is a 82 y.o. female with history of CAD status post CABG, diastolic CHF, COPD, GI bleed, chronic atrial fibrillation was brought to the ER after patient was found to be having increasing confusion and agitation since yesterday morning. In the ER patient is found to be confused but afebrile. CT of the head did not show any acute. As per the family if patient gets UTI patient usually gets confused. UA shows W BCs and leukocyte esterase positive but also some squamous cells. On exam patient does have diffuse wheezing. Patient has been admitted for acute encephalopathy probably from UTI. MRI brain is pending. In addition patient is also having COPD exacerbation. " Daily update, Procedures: Admitted on 05/01/2015, 05/04/2015, 3 second SVR on telemetry due to medication. Consultants: None Antibiotics: Anti-infectives    Start     Dose/Rate Route Frequency Ordered Stop   05/03/15 0000  cephALEXin (KEFLEX) 500 MG capsule     500 mg Oral Every 8 hours 05/03/15 1012 05/06/15 2359   05/01/15 1115  cephALEXin (KEFLEX) capsule 500 mg     500 mg Oral Every 8 hours 05/01/15 1058     05/01/15 0630  levofloxacin (LEVAQUIN) IVPB 750 mg  Status:  Discontinued     750 mg 100 mL/hr over 90 Minutes Intravenous Every 48 hours 05/01/15 0628 05/01/15 1058      Family Communication: no family was present at bedside, at the time  of interview.   Disposition:  Expected discharge date: 05/06/2015 Barriers to safe discharge: Improvement in shortness of breath   Intake/Output  Summary (Last 24 hours) at 05/04/15 1645 Last data filed at 05/04/15 1336  Gross per 24 hour  Intake    840 ml  Output   1550 ml  Net   -710 ml   Filed Weights   04/30/15 2344 05/01/15 0337 05/01/15 2117  Weight: 74.844 kg (165 lb) 76.3 kg (168 lb 3.4 oz) 78.3 kg (172 lb 9.9 oz)    Objective: Physical Exam: Filed Vitals:   05/03/15 1512 05/04/15 0613 05/04/15 0830 05/04/15 0945  BP: 109/55 146/69 135/72   Pulse: 75 59 64   Temp:  98.6 F (37 C) 98.1 F (36.7 C)   TempSrc:  Oral Oral   Resp:  19 20   Height:      Weight:      SpO2:  97% 98% 99%    General: Appear in mild distress, no Rash; Oral Mucosa moist. Cardiovascular: S1 and S2 Present, no Murmur, no JVD Respiratory: Bilateral Air entry present and Clear to Auscultation, no Crackles, occasional wheezes Abdomen: Bowel Sound present, Soft and no tenderness Extremities: no Pedal edema, no calf tenderness  Data Reviewed: CBC:  Recent Labs Lab 05/01/15 0050 05/01/15 0755 05/02/15 0433 05/03/15 0542 05/04/15 0850  WBC 10.7* 7.5 10.6* 15.6* 16.2*  NEUTROABS 8.6*  --   --   --  14.5*  HGB 10.0* 9.2* 10.1* 9.4* 10.8*  HCT 31.3* 30.0* 30.8* 30.3* 34.6*  MCV 90.5 90.6 91.1 90.7 91.1  PLT 283 254 252 257 294   Basic Metabolic Panel:  Recent Labs Lab 05/01/15 0050 05/01/15 0755 05/02/15 0433 05/03/15 0542 05/04/15 0850  NA 137 139 139 133* 137  K 3.7 3.6 4.4 4.3 5.0  CL 96* 99* 98* 94* 96*  CO2 30 32 30 31 32  GLUCOSE 106* 100* 183* 172* 128*  BUN 18 16 20  25* 28*  CREATININE 1.42* 1.18* 1.16* 1.35* 1.24*  CALCIUM 10.0 9.6 9.5 9.5 10.0  MG 1.9  --   --   --  2.6*   Liver Function Tests:  Recent Labs Lab 05/01/15 0050 05/01/15 0755  AST 17 15  ALT 13* 12*  ALKPHOS 77 73  BILITOT 1.1 0.8  PROT 5.7* 5.2*  ALBUMIN 3.0* 2.7*    Recent Labs Lab 05/01/15 0050  LIPASE 33    Recent Labs Lab 05/01/15 0051  AMMONIA 10    Cardiac Enzymes:  Recent Labs Lab 05/04/15 0850  TROPONINI 0.07*      BNP (last 3 results)  Recent Labs  11/17/14 2223 03/18/15 1605  BNP 179.0* 153.9*    CBG:  Recent Labs Lab 05/03/15 1149 05/03/15 1625 05/03/15 2031 05/04/15 0751 05/04/15 1231  GLUCAP 158* 239* 183* 134* 169*    Recent Results (from the past 240 hour(s))  Culture, blood (Routine X 2) w Reflex to ID Panel     Status: None (Preliminary result)   Collection Time: 05/01/15 12:35 AM  Result Value Ref Range Status   Specimen Description BLOOD RIGHT ARM  Final   Special Requests AEROBIC BOTTLE ONLY  Final   Culture NO GROWTH 3 DAYS  Final   Report Status PENDING  Incomplete  Culture, blood (Routine X 2) w Reflex to ID Panel     Status: None (Preliminary result)   Collection Time: 05/01/15 12:40 AM  Result Value Ref Range Status   Specimen Description BLOOD RIGHT  ARM  Final   Special Requests AEROBIC BOTTLE ONLY 5ML  Final   Culture NO GROWTH 3 DAYS  Final   Report Status PENDING  Incomplete  Urine culture     Status: None   Collection Time: 05/01/15  1:04 AM  Result Value Ref Range Status   Specimen Description URINE, CATHETERIZED  Final   Special Requests NONE  Final   Culture NO GROWTH 1 DAY  Final   Report Status 05/02/2015 FINAL  Final     Studies: No results found.   Scheduled Meds: . antiseptic oral rinse  7 mL Mouth Rinse BID  . aspirin EC  81 mg Oral Daily  . atorvastatin  20 mg Oral q1800  . budesonide (PULMICORT) nebulizer solution  0.25 mg Nebulization BID  . cephALEXin  500 mg Oral Q8H  . enoxaparin (LOVENOX) injection  40 mg Subcutaneous Q24H  . feeding supplement (ENSURE ENLIVE)  237 mL Oral Q24H  . feeding supplement (PRO-STAT SUGAR FREE 64)  30 mL Oral Daily  . furosemide  40 mg Oral Daily  . insulin aspart  0-9 Units Subcutaneous TID WC  . insulin glargine  10 Units Subcutaneous QHS  . ipratropium-albuterol  3 mL Nebulization BID  . magnesium oxide  400 mg Oral Daily  . pantoprazole  40 mg Oral Daily  . predniSONE  50 mg Oral Q  breakfast   Continuous Infusions:  PRN Meds: acetaminophen, albuterol, ALPRAZolam, nitroGLYCERIN, ondansetron, polyethylene glycol, senna  Time spent: 30 minutes  Author: Lynden OxfordPranav Lovada Barwick, MD [facility] Pager: [phone] 05/04/2015 4:45 PM  If 7PM-7AM, please contact night-coverage at [email or web address], password [email or web address]

## 2015-05-05 LAB — BASIC METABOLIC PANEL
Anion gap: 4 — ABNORMAL LOW (ref 5–15)
BUN: 27 mg/dL — ABNORMAL HIGH (ref 6–20)
CALCIUM: 9.7 mg/dL (ref 8.9–10.3)
CO2: 37 mmol/L — AB (ref 22–32)
CREATININE: 1.18 mg/dL — AB (ref 0.44–1.00)
Chloride: 98 mmol/L — ABNORMAL LOW (ref 101–111)
GFR calc non Af Amer: 41 mL/min — ABNORMAL LOW (ref >60)
GFR, EST AFRICAN AMERICAN: 48 mL/min — AB (ref >60)
Glucose, Bld: 136 mg/dL — ABNORMAL HIGH (ref 65–99)
Potassium: 4.5 mmol/L (ref 3.5–5.1)
SODIUM: 139 mmol/L (ref 135–145)

## 2015-05-05 LAB — CBC
HEMATOCRIT: 32 % — AB (ref 36.0–46.0)
Hemoglobin: 9.7 g/dL — ABNORMAL LOW (ref 12.0–15.0)
MCH: 28.2 pg (ref 26.0–34.0)
MCHC: 30.3 g/dL (ref 30.0–36.0)
MCV: 93 fL (ref 78.0–100.0)
Platelets: 230 10*3/uL (ref 150–400)
RBC: 3.44 MIL/uL — ABNORMAL LOW (ref 3.87–5.11)
RDW: 15.3 % (ref 11.5–15.5)
WBC: 9.8 10*3/uL (ref 4.0–10.5)

## 2015-05-05 LAB — MAGNESIUM: Magnesium: 2.5 mg/dL — ABNORMAL HIGH (ref 1.7–2.4)

## 2015-05-05 LAB — GLUCOSE, CAPILLARY
GLUCOSE-CAPILLARY: 107 mg/dL — AB (ref 65–99)
Glucose-Capillary: 138 mg/dL — ABNORMAL HIGH (ref 65–99)
Glucose-Capillary: 256 mg/dL — ABNORMAL HIGH (ref 65–99)
Glucose-Capillary: 167 mg/dL — ABNORMAL HIGH (ref 65–99)

## 2015-05-05 MED ORDER — DILTIAZEM HCL 30 MG PO TABS
30.0000 mg | ORAL_TABLET | Freq: Four times a day (QID) | ORAL | Status: DC
  Administered 2015-05-05 (×2): 30 mg via ORAL
  Filled 2015-05-05 (×2): qty 1

## 2015-05-05 MED ORDER — CEPHALEXIN 500 MG PO CAPS
500.0000 mg | ORAL_CAPSULE | Freq: Three times a day (TID) | ORAL | Status: AC
  Administered 2015-05-05 – 2015-05-06 (×2): 500 mg via ORAL
  Filled 2015-05-05 (×2): qty 1

## 2015-05-05 NOTE — Clinical Social Work Placement (Signed)
   CLINICAL SOCIAL WORK PLACEMENT  NOTE  Date:  05/05/2015  Patient Details  Name: [name] MRN: [id number] Date of Birth: January 02, 1930  Clinical Social Work is seeking post-discharge placement for this patient at the Skilled  Nursing Facility level of care (*CSW will initial, date and re-position this form in  chart as items are completed):  Yes   Patient/family provided with [place] Clinical Social Work Department's list of facilities offering this level of care within the geographic area requested by the patient (or if unable, by the patient's family).  Yes   Patient/family informed of their freedom to choose among providers that offer the needed level of care, that participate in Medicare, Medicaid or managed care program needed by the patient, have an available bed and are willing to accept the patient.  Yes   Patient/family informed of [place]'s ownership interest in [facility] and [facility], as well as of the fact that they are under no obligation to receive care at these facilities.  PASRR submitted to EDS on 05/05/15     PASRR number received on 05/05/15     Existing PASRR number confirmed on       FL2 transmitted to all facilities in geographic area requested by pt/family on 05/05/15     FL2 transmitted to all facilities within larger geographic area on       Patient informed that his/her managed care company has contracts with or will negotiate with certain facilities, including the following:            Patient/family informed of bed offers received.  Patient chooses bed at       Physician recommends and patient chooses bed at      Patient to be transferred to   on  .  Patient to be transferred to facility by       Patient family notified on   of transfer.  Name of family member notified:        PHYSICIAN Please sign FL2     Additional Comment:    _______________________________________________ Raye Sorrowoble, Jep Dyas N, LCSW 05/05/2015, 2:11  PM

## 2015-05-05 NOTE — Progress Notes (Addendum)
Patient A/O, no noted distress. Patient was able to answer questions appropriately. Patient ask "how are the roads and what was the temp?" Patient tolerated meds well. She is able to make needs known. Patient slept well throughout the shift. Staff will continue to monitor and meet needs. Patient was educated on medication and potential discharge. Patient has scattered bruises BUE

## 2015-05-05 NOTE — Progress Notes (Signed)
[facility] Progress Note  Patient: [name] ZOX:[id number]   PCP: Kaleen Mask, MD DOB: [birth year]   DOA: 04/30/2015   DOS: 05/05/2015   Date of Service: the patient was seen and examined on 05/05/2015  Subjective: Patient continues to have complaints of shortness of breath, fatigue is also present. No chest pain and abdominal pain. Nutrition: Able to tolerate oral diet Activity: Ambulating in the room Last BM: 05/03/2015  Assessment and Plan: 1. Acute encephalopathy Patient presented with confusion., CT head was unremarkable. UDS was positive only for benzodiazepine, negative ammonia, negative alcohol, negative Tylenol and salicylate level. Urine did show some pyuria culture shows no growth. Currently the patient is at her baseline. Most likely in the setting of hypoxia as well as bradycardia. Continue close monitoring. No evidence of focal deficit.  2. COPD exacerbation. Probable acute bronchitis. Oral prednisone Continue with Keflex. D5 Continue with DuoNeb's and budesonide nebulizer.  3. Coronary disease, history of CABG. Continue with 81 mg aspirin.  4. History of chronic diastolic dysfunction Chronic atrial fibrillation. Slow ventricular response with bradycardia. Discontinued metoprolol,  Digoxin level within therapeutic range, continue digoxin, start Cardizem at low-dose 30 mg every 6 hours The bradycardia is most likely secondary to dual AV node blocking.  Monitor her on telemetry after resumption of the Cardizem and if she has further pause were discussed with cardiology.  Not a candidate for anticoagulation due to GI bleeding. On 81 mg aspirin. Continue with oral Lasix. Continue Lipitor.  5. Diabetes mellitus.  Last lipid and A1c 5.7 in December 2016. Continuing home insulin regimen with sliding scale.  6. Generalized fatigue and deconditioning. Possibility of bradycardia causing fatigue cannot be ruled out. Patient with recurrent  hospitalization with inability to get home health after her recent hospitalization. Currently physical therapy recommends SNF. Will consult social worker.  DVT Prophylaxis: subcutaneous Heparin Nutrition: Cardiac and diabetic diet Advance goals of care discussion: Full code  Brief Summary of Hospitalization:  HPI: As per the H and P dictated on admission, "SADE HOLLON is a 82 y.o. female with history of CAD status post CABG, diastolic CHF, COPD, GI bleed, chronic atrial fibrillation was brought to the ER after patient was found to be having increasing confusion and agitation since yesterday morning. In the ER patient is found to be confused but afebrile. CT of the head did not show any acute. As per the family if patient gets UTI patient usually gets confused. UA shows W BCs and leukocyte esterase positive but also some squamous cells. On exam patient does have diffuse wheezing. Patient has been admitted for acute encephalopathy probably from UTI. MRI brain is pending. In addition patient is also having COPD exacerbation. " Daily update, Procedures: Admitted on 05/01/2015, 05/04/2015, 3 second SVR on telemetry due to medication. Consultants: None Antibiotics: Anti-infectives    Start     Dose/Rate Route Frequency Ordered Stop   05/03/15 0000  cephALEXin (KEFLEX) 500 MG capsule     500 mg Oral Every 8 hours 05/03/15 1012 05/06/15 2359   05/01/15 1115  cephALEXin (KEFLEX) capsule 500 mg     500 mg Oral Every 8 hours 05/01/15 1058     05/01/15 0630  levofloxacin (LEVAQUIN) IVPB 750 mg  Status:  Discontinued     750 mg 100 mL/hr over 90 Minutes Intravenous Every 48 hours 05/01/15 0628 05/01/15 1058      Family Communication: no family was present at bedside, at the time of interview.   Disposition:  Expected  discharge date: 05/06/2015 Barriers to safe discharge: Improvement in shortness of breath   Intake/Output Summary (Last 24 hours) at 05/05/15 1458 Last data filed at 05/05/15 1108   Gross per 24 hour  Intake    480 ml  Output    900 ml  Net   -420 ml   Filed Weights   05/01/15 0337 05/01/15 2117 05/04/15 2118  Weight: 76.3 kg (168 lb 3.4 oz) 78.3 kg (172 lb 9.9 oz) 77.7 kg (171 lb 4.8 oz)    Objective: Physical Exam: Filed Vitals:   05/05/15 0422 05/05/15 0509 05/05/15 0823 05/05/15 1033  BP: 121/77  140/75   Pulse: 75  80 71  Temp:  97.6 F (36.4 C) 97.8 F (36.6 C)   TempSrc:   Oral   Resp: 20  18 16   Height:      Weight:      SpO2: 100%  100% 99%    General: Appear in mild distress, no Rash; Oral Mucosa moist. Cardiovascular: S1 and S2 Present, no Murmur, no JVD Respiratory: Bilateral Air entry present and Clear to Auscultation, no Crackles, occasional wheezes Abdomen: Bowel Sound present, Soft and no tenderness Extremities: no Pedal edema, no calf tenderness  Data Reviewed: CBC:  Recent Labs Lab 05/01/15 0050 05/01/15 0755 05/02/15 0433 05/03/15 0542 05/04/15 0850 05/05/15 0513  WBC 10.7* 7.5 10.6* 15.6* 16.2* 9.8  NEUTROABS 8.6*  --   --   --  14.5*  --   HGB 10.0* 9.2* 10.1* 9.4* 10.8* 9.7*  HCT 31.3* 30.0* 30.8* 30.3* 34.6* 32.0*  MCV 90.5 90.6 91.1 90.7 91.1 93.0  PLT 283 254 252 257 294 230   Basic Metabolic Panel:  Recent Labs Lab 05/01/15 0050 05/01/15 0755 05/02/15 0433 05/03/15 0542 05/04/15 0850 05/05/15 0513  NA 137 139 139 133* 137 139  K 3.7 3.6 4.4 4.3 5.0 4.5  CL 96* 99* 98* 94* 96* 98*  CO2 30 32 30 31 32 37*  GLUCOSE 106* 100* 183* 172* 128* 136*  BUN 18 16 20  25* 28* 27*  CREATININE 1.42* 1.18* 1.16* 1.35* 1.24* 1.18*  CALCIUM 10.0 9.6 9.5 9.5 10.0 9.7  MG 1.9  --   --   --  2.6* 2.5*   Liver Function Tests:  Recent Labs Lab 05/01/15 0050 05/01/15 0755  AST 17 15  ALT 13* 12*  ALKPHOS 77 73  BILITOT 1.1 0.8  PROT 5.7* 5.2*  ALBUMIN 3.0* 2.7*    Recent Labs Lab 05/01/15 0050  LIPASE 33    Recent Labs Lab 05/01/15 0051  AMMONIA 10    Cardiac Enzymes:  Recent Labs Lab  05/04/15 0850 05/04/15 1500 05/04/15 1928  TROPONINI 0.07* 0.05* 0.06*    BNP (last 3 results)  Recent Labs  11/17/14 2223 03/18/15 1605  BNP 179.0* 153.9*    CBG:  Recent Labs Lab 05/04/15 1231 05/04/15 1647 05/04/15 2115 05/05/15 0751 05/05/15 1131  GLUCAP 169* 216* 151* 107* 138*    Recent Results (from the past 240 hour(s))  Culture, blood (Routine X 2) w Reflex to ID Panel     Status: None (Preliminary result)   Collection Time: 05/01/15 12:35 AM  Result Value Ref Range Status   Specimen Description BLOOD RIGHT ARM  Final   Special Requests AEROBIC BOTTLE ONLY  Final   Culture NO GROWTH 4 DAYS  Final   Report Status PENDING  Incomplete  Culture, blood (Routine X 2) w Reflex to ID Panel     Status:  None (Preliminary result)   Collection Time: 05/01/15 12:40 AM  Result Value Ref Range Status   Specimen Description BLOOD RIGHT ARM  Final   Special Requests AEROBIC BOTTLE ONLY 5ML  Final   Culture NO GROWTH 4 DAYS  Final   Report Status PENDING  Incomplete  Urine culture     Status: None   Collection Time: 05/01/15  1:04 AM  Result Value Ref Range Status   Specimen Description URINE, CATHETERIZED  Final   Special Requests NONE  Final   Culture NO GROWTH 1 DAY  Final   Report Status 05/02/2015 FINAL  Final     Studies: No results found.   Scheduled Meds: . antiseptic oral rinse  7 mL Mouth Rinse BID  . aspirin EC  81 mg Oral Daily  . atorvastatin  20 mg Oral q1800  . budesonide (PULMICORT) nebulizer solution  0.25 mg Nebulization BID  . cephALEXin  500 mg Oral Q8H  . digoxin  0.125 mg Oral Daily  . diltiazem  30 mg Oral Q6H  . enoxaparin (LOVENOX) injection  40 mg Subcutaneous Q24H  . feeding supplement (ENSURE ENLIVE)  237 mL Oral Q24H  . feeding supplement (PRO-STAT SUGAR FREE 64)  30 mL Oral Daily  . furosemide  40 mg Oral Daily  . insulin aspart  0-9 Units Subcutaneous TID WC  . insulin glargine  10 Units Subcutaneous QHS  .  ipratropium-albuterol  3 mL Nebulization BID  . magnesium oxide  400 mg Oral Daily  . pantoprazole  40 mg Oral Daily  . predniSONE  50 mg Oral Q breakfast   Continuous Infusions:  PRN Meds: acetaminophen, albuterol, ALPRAZolam, nitroGLYCERIN, ondansetron, polyethylene glycol, senna  Time spent: 30 minutes  Author: Lynden OxfordPranav Patel, MD [facility] Pager: [phone] 05/05/2015 2:58 PM  If 7PM-7AM, please contact night-coverage at [email or web address], password [email or web address]

## 2015-05-05 NOTE — Clinical Social Work Note (Signed)
Clinical Social Work Assessment  Patient Details  Name: [name] MRN: [id number] Date of Birth: 1930/03/03  Date of referral:  05/05/15               Reason for consult:  Facility Placement                Permission sought to share information with:  Case Manager, Magazine features editoracility Contact Representative, Family Supports Permission granted to share information::  Yes, Verbal Permission Granted  Name::        Agency::     Relationship::  Patient's grandson  Contact Information:     Housing/Transportation Living arrangements for the past 2 months:  Skilled Building surveyorursing Facility Source of Information:  Patient, Medical Team Patient Interpreter Needed:  None Criminal Activity/Legal Involvement Pertinent to Current Situation/Hospitalization:  No - Comment as needed Significant Relationships:  Other Family Members Lives with:  Self, Relatives, Other (Comment) (grandson) Do you feel safe going back to the place where you live?  No (need for ST SNF at [place]) Need for family participation in patient care:  Yes (Comment) (if needed, but patient able to make own decisions)  Care giving concerns:  Patient reports she lives with her grandson and at this time agreeable with recommendation for SNF. Patient reports she has been to [facility] in the past for rehab and agreeable to return if bed available.  Reports she is open to going home as well, but feels she will benefit from SNF to get her stronger   Office managerocial Worker assessment / plan:  LCSW completed assessment with patient. Agreeable to SNF thus SNF referral completed for patient. FL2 and passar also completed. Will have weekday CSW to follow up with patient on bed offers and disposition. Patient denies at this time to contact any family.  Employment status:  Retired Database administratornsurance information:  Managed Medicare PT Recommendations:  Skilled Nursing Facility Information / Referral to community resources:  Skilled Nursing Facility  Patient/Family's  Response to care:  Agreeable  Patient/Family's Understanding of and Emotional Response to Diagnosis, Current Treatment, and Prognosis:  Patient voices understanding regarding recommendation and agreeable. She complains of a headache, but reports she is doing fine with no problems.  Emotional Assessment Appearance:  Appears stated age Attitude/Demeanor/Rapport:  Other (Ptient was sleeping, awoke easily and cooperative) Affect (typically observed):  Accepting, Adaptable, Pleasant Orientation:  Oriented to Self, Oriented to Place, Oriented to  Time, Oriented to Situation Alcohol / Substance use:  Not Applicable Psych involvement (Current and /or in the community):  No (Comment)  Discharge Needs  Concerns to be addressed:  Care Coordination Readmission within the last 30 days:    Current discharge risk:  None Barriers to Discharge:  English as a second language teachernsurance Authorization, Continued Medical Work up   Karen SorrowCoble, [name] N, LCSW 05/05/2015, 2:09 PM

## 2015-05-06 LAB — CULTURE, BLOOD (ROUTINE X 2)
CULTURE: NO GROWTH
CULTURE: NO GROWTH

## 2015-05-06 LAB — GLUCOSE, CAPILLARY
GLUCOSE-CAPILLARY: 105 mg/dL — AB (ref 65–99)
Glucose-Capillary: 157 mg/dL — ABNORMAL HIGH (ref 65–99)

## 2015-05-06 LAB — CBC
HCT: 35.5 % — ABNORMAL LOW (ref 36.0–46.0)
Hemoglobin: 10.9 g/dL — ABNORMAL LOW (ref 12.0–15.0)
MCH: 28.4 pg (ref 26.0–34.0)
MCHC: 30.7 g/dL (ref 30.0–36.0)
MCV: 92.4 fL (ref 78.0–100.0)
PLATELETS: 225 10*3/uL (ref 150–400)
RBC: 3.84 MIL/uL — AB (ref 3.87–5.11)
RDW: 15.1 % (ref 11.5–15.5)
WBC: 11.8 10*3/uL — ABNORMAL HIGH (ref 4.0–10.5)

## 2015-05-06 MED ORDER — DILTIAZEM HCL 30 MG PO TABS
30.0000 mg | ORAL_TABLET | Freq: Four times a day (QID) | ORAL | Status: DC
  Administered 2015-05-06 (×2): 30 mg via ORAL
  Filled 2015-05-06 (×2): qty 1

## 2015-05-06 MED ORDER — CEPHALEXIN 500 MG PO CAPS: 500.0000 mg | ORAL_CAPSULE | Freq: Three times a day (TID) | ORAL | Status: AC

## 2015-05-06 MED ORDER — DILTIAZEM HCL ER 120 MG PO CP24
120.0000 mg | ORAL_CAPSULE | Freq: Every day | ORAL | Status: DC
Start: 2015-05-06 — End: 2015-07-01

## 2015-05-06 NOTE — Progress Notes (Signed)
Randa Lynn to be D/C'd Skilled nursing facility per MD order.  Discussed prescriptions and follow up appointments with the patient. Prescriptions given to patient, medication list explained in detail. Pt verbalized understanding.    Medication List    STOP taking these medications        diltiazem 240 MG 24 hr capsule  Commonly known as:  CARDIZEM CD     magnesium oxide 400 MG tablet  Commonly known as:  MAG-OX     metoprolol succinate 25 MG 24 hr tablet  Commonly known as:  TOPROL-XL      TAKE these medications        albuterol 108 (90 Base) MCG/ACT inhaler  Commonly known as:  PROVENTIL HFA;VENTOLIN HFA  Inhale 2 puffs into the lungs every 4 (four) hours as needed for wheezing or shortness of breath.     albuterol (2.5 MG/3ML) 0.083% nebulizer solution  Commonly known as:  PROVENTIL  Take 2.5 mg by nebulization every 6 (six) hours as needed for wheezing.     ALPRAZolam 0.25 MG tablet  Commonly known as:  XANAX  Take 1 tablet (0.25 mg total) by mouth at bedtime as needed for anxiety.     aspirin EC 81 MG tablet  Take 81 mg by mouth daily.     atorvastatin 40 MG tablet  Commonly known as:  LIPITOR  Take 0.5 tablets (20 mg total) by mouth daily at 6 PM.     cephALEXin 500 MG capsule  Commonly known as:  KEFLEX  Take 1 capsule (500 mg total) by mouth every 8 (eight) hours.     digoxin 0.125 MG tablet  Commonly known as:  LANOXIN  Take 1 tablet (0.125 mg total) by mouth daily.     diltiazem 120 MG 24 hr capsule  Commonly known as:  DILACOR XR  Take 1 capsule (120 mg total) by mouth daily.     feeding supplement (ENSURE ENLIVE) Liqd  Take 237 mLs by mouth daily.     feeding supplement (PRO-STAT SUGAR FREE 64) Liqd  Take 30 mLs by mouth daily.     furosemide 40 MG tablet  Commonly known as:  LASIX  Take 1 tablet (40 mg total) by mouth daily.     insulin aspart 100 UNIT/ML injection  Commonly known as:  NOVOLOG  Before each meal 3 times a day, 140-199 - 2  units, 200-250 - 4 units, 251-299 - 6 units,  300-349 - 8 units,  350 or above 10 units. Dispense syringes and needles as needed, Ok to switch to PEN if approved. Substitute to any brand approved. DX DM2, Code E11.65     insulin glargine 100 UNIT/ML injection  Commonly known as:  LANTUS  Inject 0.1 mLs (10 Units total) into the skin at bedtime.     nitroGLYCERIN 0.4 MG SL tablet  Commonly known as:  NITROSTAT  Place 0.4 mg under the tongue every 5 (five) minutes as needed for chest pain.     ondansetron 8 MG tablet  Commonly known as:  ZOFRAN  Take 1 tablet (8 mg total) by mouth every 8 (eight) hours as needed for nausea or vomiting.     OXYGEN  Inhale into the lungs. 2L continously     pantoprazole 40 MG tablet  Commonly known as:  PROTONIX  Take 1 tablet (40 mg total) by mouth daily.     polyethylene glycol packet  Commonly known as:  MIRALAX / GLYCOLAX  Take 17 g by  mouth daily as needed for mild constipation or moderate constipation.     potassium chloride SA 20 MEQ tablet  Commonly known as:  K-DUR,KLOR-CON  Take 1 tablet (20 mEq total) by mouth daily.     predniSONE 10 MG tablet  Commonly known as:  DELTASONE  Take 40 mg daily for 2 days,Take 30 mg daily for 2 days,Take 20 mg daily for 2 days,Take 10 mg daily for 2 days, then stop     senna 8.6 MG Tabs tablet  Commonly known as:  SENOKOT  Take 8.6 mg by mouth daily as needed for mild constipation or moderate constipation.     SPIRIVA HANDIHALER 18 MCG inhalation capsule  Generic drug:  tiotropium  INHALE THE CONTENTS OF 1 CAPSULE DAILY     SYMBICORT 160-4.5 MCG/ACT inhaler  Generic drug:  budesonide-formoterol  USE 2 INHALATIONS TWICE A DAY        Filed Vitals:   05/06/15 0500 05/06/15 0915  BP: 128/84 140/54  Pulse: 89 89  Temp: 97.6 F (36.4 C) 97.6 F (36.4 C)  Resp: 18 16    Skin clean, dry and intact without evidence of skin break down, no evidence of skin tears noted. IV catheter discontinued  intact. Site without signs and symptoms of complications. Dressing and pressure applied. Pt denies pain at this time. No complaints noted.  . Patient escorted via stretcher, and D/C home via private ambulance.  Janeann ForehandLuke Bobbyjoe Pabst BSN, RN

## 2015-05-06 NOTE — Care Management Important Message (Signed)
Important Message  Patient Details  Name: [name] MRN: [id number] Date of Birth: Apr 10, 1930   Medicare Important Message Given:  Yes    Rayvon CharSTUTTS, Peony Barner G 05/06/2015, 2:00 PMImportant Message  Patient Details  Name: [name] MRN: [id number] Date of Birth: Apr 10, 1930   Medicare Important Message Given:  Yes    Atziry Baranski G 05/06/2015, 2:00 PM

## 2015-05-06 NOTE — Discharge Summary (Signed)
[facility] Discharge Summary   Patient: [name] WJX:[id number]   PCP: Kaleen Mask, MD DOB: 06-Sep-1929   Date of admission: 04/30/2015   Date of discharge: 05/06/2015    Discharge Diagnoses:  Principal Problem:   Acute encephalopathy Active Problems:   Hypertension   Hx of CABG   COPD exacerbation (HCC)   Chronic diastolic congestive heart failure (HCC)  Recommendations for Outpatient Follow-up:  1. Follow-up with PCP in one week regarding atrial fibrillation and diabetes adjustment 2. Follow-up with cardiology in 3 weeks regarding adjustment of Lasix and medication for rate control.  3. Discharge at [facility] nursing home.  Follow-up Information    Follow up with Kaleen Mask, MD. Schedule an appointment as soon as possible for a visit in 1 week.   Specialty:  Family Medicine   Contact information:   [address] [place] [address] [phone]       Follow up with [facility] R, NP. Schedule an appointment as soon as possible for a visit in 3 weeks.   Specialties:  Cardiology, Radiology   Why:  atrial fibrillation   Contact information:   [address] [place] [address] [phone]       Diet recommendation: Cardiac and carb modified diet  Activity: The patient is advised to gradually reintroduce usual activities.  Discharge Condition: fair  History of present illness: As per the H and P dictated on admission, "[name] is a 82 y.o. female with history of CAD status post CABG, diastolic CHF, COPD, GI bleed, chronic atrial fibrillation was brought to the ER after patient was found to be having increasing confusion and agitation since yesterday morning. In the ER patient is found to be confused but afebrile. CT of the head did not show any acute. As per the family if patient gets UTI patient usually gets confused. UA shows W BCs and leukocyte esterase positive but also some squamous cells. On exam patient does  have diffuse wheezing. Patient has been admitted for acute encephalopathy probably from UTI. MRI brain is pending. In addition patient is also having COPD exacerbation. "  Hospital Course:  Summary of her active problems in the hospital is as following.  1. Acute encephalopathy Patient presented with confusion., CT head was unremarkable. UDS was positive only for benzodiazepine, negative ammonia, negative alcohol, negative Tylenol and salicylate level. Urine did show some pyuria culture shows no growth. Currently the patient is at her baseline. Most likely in the setting of hypoxia as well as bradycardia. Continue close monitoring. No evidence of focal deficit.  2. COPD exacerbation. Probable acute bronchitis. Oral prednisone tapering Patient will finish Keflex today. Continue with home inhaler  3. Coronary disease, history of CABG. Continue with 81 mg aspirin.  4. History of chronic diastolic dysfunction Chronic atrial fibrillation. Slow ventricular response with bradycardia. Discontinued metoprolol,  Digoxin level within therapeutic range, continue digoxin,  start Cardizem at low-dose 120 mg every 24 hours The bradycardia is most likely secondary to dual AV node blocking. Improved with holding Lopressor and reducing the dose of Cardizem. Patient will need follow-up with cardiology to adjust her medication.  Not a candidate for anticoagulation due to GI bleeding. On 81 mg aspirin. Continue with oral Lasix. Continue Lipitor.  5. Diabetes mellitus. Last lipid and A1c 5.7 in December 2016. Continuing home insulin regimen  6. Generalized fatigue and deconditioning. Possibility of bradycardia causing fatigue cannot be ruled out. Patient with recurrent hospitalization with inability to get home  health after her recent hospitalization. Currently physical therapy recommends SNF.   All other chronic medical condition were stable during the hospitalization.  Patient was seen by  physical therapy, who recommended SNF, which was arranged by Child psychotherapist and case Production designer, theatre/television/film. On the day of the discharge the patient's vitals remained stable, telemetry was not showing any significant pause, and no other acute medical condition were reported by patient. the patient was felt safe to be discharge at [facility] nursing home.  Procedures and Results:  none   Consultations:  none  DISCHARGE MEDICATION: Current Discharge Medication List    START taking these medications   Details  cephALEXin (KEFLEX) 500 MG capsule Take 1 capsule (500 mg total) by mouth every 8 (eight) hours. Qty: 1 capsule, Refills: 0    diltiazem (DILACOR XR) 120 MG 24 hr capsule Take 1 capsule (120 mg total) by mouth daily. Qty: 30 capsule, Refills: 0      CONTINUE these medications which have CHANGED   Details  predniSONE (DELTASONE) 10 MG tablet Take 40 mg daily for 2 days,Take 30 mg daily for 2 days,Take 20 mg daily for 2 days,Take 10 mg daily for 2 days, then stop Qty: 20 tablet, Refills: 0      CONTINUE these medications which have NOT CHANGED   Details  albuterol (PROVENTIL HFA;VENTOLIN HFA) 108 (90 BASE) MCG/ACT inhaler Inhale 2 puffs into the lungs every 4 (four) hours as needed for wheezing or shortness of breath. Qty: 1 Inhaler, Refills: 5    albuterol (PROVENTIL) (2.5 MG/3ML) 0.083% nebulizer solution Take 2.5 mg by nebulization every 6 (six) hours as needed for wheezing.    ALPRAZolam (XANAX) 0.25 MG tablet Take 1 tablet (0.25 mg total) by mouth at bedtime as needed for anxiety. Qty: 10 tablet, Refills: 0    Amino Acids-Protein Hydrolys (FEEDING SUPPLEMENT, PRO-STAT SUGAR FREE 64,) LIQD Take 30 mLs by mouth daily. Qty: 900 mL, Refills: 0    aspirin EC 81 MG tablet Take 81 mg by mouth daily.    atorvastatin (LIPITOR) 40 MG tablet Take 0.5 tablets (20 mg total) by mouth daily at 6 PM. Qty: 90 tablet, Refills: 1    digoxin (LANOXIN) 0.125 MG tablet Take 1 tablet (0.125 mg total) by  mouth daily. Qty: 90 tablet, Refills: 3   Associated Diagnoses: Acute exacerbation of chronic obstructive pulmonary disease (COPD) (HCC); Atrial fibrillation with RVR (HCC)    feeding supplement, ENSURE ENLIVE, (ENSURE ENLIVE) LIQD Take 237 mLs by mouth daily. Qty: 237 mL, Refills: 12    furosemide (LASIX) 40 MG tablet Take 1 tablet (40 mg total) by mouth daily. Qty: 90 tablet, Refills: 0    insulin aspart (NOVOLOG) 100 UNIT/ML injection Before each meal 3 times a day, 140-199 - 2 units, 200-250 - 4 units, 251-299 - 6 units,  300-349 - 8 units,  350 or above 10 units. Dispense syringes and needles as needed, Ok to switch to PEN if approved. Substitute to any brand approved. DX DM2, Code E11.65 Qty: 1 vial, Refills: 12    insulin glargine (LANTUS) 100 UNIT/ML injection Inject 0.1 mLs (10 Units total) into the skin at bedtime. Qty: 10 mL, Refills: 11    nitroGLYCERIN (NITROSTAT) 0.4 MG SL tablet Place 0.4 mg under the tongue every 5 (five) minutes as needed for chest pain.    ondansetron (ZOFRAN) 8 MG tablet Take 1 tablet (8 mg total) by mouth every 8 (eight) hours as needed for nausea or vomiting. Qty: 20 tablet, Refills:  0    pantoprazole (PROTONIX) 40 MG tablet Take 1 tablet (40 mg total) by mouth daily. Qty: 90 tablet, Refills: 3    polyethylene glycol (MIRALAX / GLYCOLAX) packet Take 17 g by mouth daily as needed for mild constipation or moderate constipation.     potassium chloride SA (K-DUR,KLOR-CON) 20 MEQ tablet Take 1 tablet (20 mEq total) by mouth daily. Qty: 30 tablet, Refills: 0    senna (SENOKOT) 8.6 MG TABS tablet Take 8.6 mg by mouth daily as needed for mild constipation or moderate constipation.     SPIRIVA HANDIHALER 18 MCG inhalation capsule INHALE THE CONTENTS OF 1 CAPSULE DAILY Qty: 90 capsule, Refills: 0    SYMBICORT 160-4.5 MCG/ACT inhaler USE 2 INHALATIONS TWICE A DAY Qty: 30.6 g, Refills: 0    OXYGEN Inhale into the lungs. 2L continously      STOP taking  these medications     diltiazem (CARDIZEM CD) 240 MG 24 hr capsule      magnesium oxide (MAG-OX) 400 MG tablet      metoprolol succinate (TOPROL-XL) 25 MG 24 hr tablet        Allergies  Allergen Reactions  . Other Other (See Comments)    TETANUS-can only take 1/2 dose at one time, can take the other 1/2 dose about 3 days later  . Codeine Nausea And Vomiting  . Sulfonamide Derivatives Nausea And Vomiting  . Warfarin     H/o AVM's prechief complaintcluding anticoagulation  . Penicillins Itching, Rash and Other (See Comments)    Nasal itching   . Ranitidine Hcl Itching and Rash   Discharge Instructions    Diet - low sodium heart healthy    Complete by:  As directed      Diet Carb Modified    Complete by:  As directed      Discharge instructions    Complete by:  As directed   It is important that you read following instructions as well as go over your medication list with RN to help you understand your care after this hospitalization.  Please request your primary care physician to go over all Hospital Tests and Procedure/Radiological results at the follow up,  Please get all Hospital records sent to your PCP by signing hospital release before you go home.   You were cared for by a hospitalist during your hospital stay. If you have any questions about your discharge medications or the care you received while you were in the hospital after you are discharged, you can call the unit and ask to speak with the hospitalist on call if the hospitalist that took care of you is not available.  Once you are discharged, your primary care physician will handle any further medical issues. Please note that NO REFILLS for any discharge medications will be authorized once you are discharged, as it is imperative that you return to your primary care physician (or establish a relationship with a primary care physician if you do not have one) for your aftercare needs so that they can reassess your need for  medications and monitor your lab values. You Must read complete instructions/literature along with all the possible adverse reactions/side effects for all the Medicines you take and that have been prescribed to you. Take any new Medicines after you have completely understood and accept all the possible adverse reactions/side effects. Wear Seat belts while driving.     Increase activity slowly    Complete by:  As directed  Discharge Exam: Filed Weights   05/01/15 2117 05/04/15 2118 05/05/15 2207  Weight: 78.3 kg (172 lb 9.9 oz) 77.7 kg (171 lb 4.8 oz) 78.4 kg (172 lb 13.5 oz)   Filed Vitals:   05/06/15 0500 05/06/15 0915  BP: 128/84 140/54  Pulse: 89 89  Temp: 97.6 F (36.4 C) 97.6 F (36.4 C)  Resp: 18 16   General: Appear in mild distress, no Rash; Oral Mucosa moist. Cardiovascular: S1 and S2 Present, no Murmur, no JVD Respiratory: Bilateral Air entry present and Clear to Auscultation, no Crackles, no wheezes Abdomen: Bowel Sound present, Soft and no tenderness Extremities: no Pedal edema, no calf tenderness Neurology: Grossly no focal neuro deficit.  The results of significant diagnostics from this hospitalization (including imaging, microbiology, ancillary and laboratory) are listed below for reference.    Significant Diagnostic Studies: Dg Chest 2 View  05/01/2015  CLINICAL DATA:  Altered mental status, cough EXAM: CHEST  2 VIEW COMPARISON:  03/24/2015 FINDINGS: Sternotomy wires overlie normal cardiac silhouette. Lungs are hyperinflated. No effusion, infiltrate, pneumothorax. IMPRESSION: Hyperinflated lungs.  No acute findings. Electronically Signed   By: Genevive BiStewart  Edmunds M.D.   On: 05/01/2015 00:25   Ct Head Wo Contrast  05/01/2015  CLINICAL DATA:  Altered mental status.  , fall EXAM: CT HEAD WITHOUT CONTRAST TECHNIQUE: Contiguous axial images were obtained from the base of the skull through the vertex without intravenous contrast. COMPARISON:  Head CT 01/06/2007  FINDINGS: No intracranial hemorrhage. No parenchymal contusion. No midline shift or mass effect. Basilar cisterns are patent. No skull base fracture. No fluid in the paranasal sinuses or mastoid air cells. Orbits are normal. Mucosal thickening in the maxillary sinuses. Periventricular white matter hypodensity. Lucent lesion in the occipital bone LEFT of midline is unchanged from 2008. IMPRESSION: 1. No acute intracranial findings. 2. No evidence trauma. 3. White matter microvascular disease. 4. Sinus inflammation. Electronically Signed   By: Genevive BiStewart  Edmunds M.D.   On: 05/01/2015 00:29    Microbiology: Recent Results (from the past 240 hour(s))  Culture, blood (Routine X 2) w Reflex to ID Panel     Status: None (Preliminary result)   Collection Time: 05/01/15 12:35 AM  Result Value Ref Range Status   Specimen Description BLOOD RIGHT ARM  Final   Special Requests AEROBIC BOTTLE ONLY 5ML  Final   Culture NO GROWTH 4 DAYS  Final   Report Status PENDING  Incomplete  Culture, blood (Routine X 2) w Reflex to ID Panel     Status: None (Preliminary result)   Collection Time: 05/01/15 12:40 AM  Result Value Ref Range Status   Specimen Description BLOOD RIGHT ARM  Final   Special Requests AEROBIC BOTTLE ONLY 5ML  Final   Culture NO GROWTH 4 DAYS  Final   Report Status PENDING  Incomplete  Urine culture     Status: None   Collection Time: 05/01/15  1:04 AM  Result Value Ref Range Status   Specimen Description URINE, CATHETERIZED  Final   Special Requests NONE  Final   Culture NO GROWTH 1 DAY  Final   Report Status 05/02/2015 FINAL  Final     Labs: CBC:  Recent Labs Lab 05/01/15 0050 05/01/15 0755 05/02/15 0433 05/03/15 0542 05/04/15 0850 05/05/15 0513  WBC 10.7* 7.5 10.6* 15.6* 16.2* 9.8  NEUTROABS 8.6*  --   --   --  14.5*  --   HGB 10.0* 9.2* 10.1* 9.4* 10.8* 9.7*  HCT 31.3* 30.0* 30.8* 30.3* 34.6* 32.0*  MCV 90.5 90.6 91.1 90.7 91.1 93.0  PLT 283 254 252 257 294 230   Basic  Metabolic Panel:  Recent Labs Lab 05/01/15 0050 05/01/15 0755 05/02/15 0433 05/03/15 0542 05/04/15 0850 05/05/15 0513  NA 137 139 139 133* 137 139  K 3.7 3.6 4.4 4.3 5.0 4.5  CL 96* 99* 98* 94* 96* 98*  CO2 30 32 30 31 32 37*  GLUCOSE 106* 100* 183* 172* 128* 136*  BUN 18 16 20  25* 28* 27*  CREATININE 1.42* 1.18* 1.16* 1.35* 1.24* 1.18*  CALCIUM 10.0 9.6 9.5 9.5 10.0 9.7  MG 1.9  --   --   --  2.6* 2.5*   Liver Function Tests:  Recent Labs Lab 05/01/15 0050 05/01/15 0755  AST 17 15  ALT 13* 12*  ALKPHOS 77 73  BILITOT 1.1 0.8  PROT 5.7* 5.2*  ALBUMIN 3.0* 2.7*    Recent Labs Lab 05/01/15 0050  LIPASE 33    Recent Labs Lab 05/01/15 0051  AMMONIA 10   Cardiac Enzymes:  Recent Labs Lab 05/04/15 0850 05/04/15 1500 05/04/15 1928  TROPONINI 0.07* 0.05* 0.06*   BNP (last 3 results)  Recent Labs  11/17/14 2223 03/18/15 1605  BNP 179.0* 153.9*   CBG:  Recent Labs Lab 05/05/15 1131 05/05/15 1605 05/05/15 2150 05/06/15 0740 05/06/15 1144  GLUCAP 138* 256* 167* 105* 157*   Time spent: 30 minutes  Signed:  Jeffrie Lofstrom  [facility] 05/06/2015, 12:13 PM

## 2015-05-06 NOTE — Clinical Social Work Placement (Signed)
   CLINICAL SOCIAL WORK PLACEMENT  NOTE 05/06/15 - DISCHARGED TO [facility]  Date:  05/06/2015  Patient Details  Name: [name] MRN: [id number] Date of Birth: July 02, 1929  Clinical Social Work is seeking post-discharge placement for this patient at the Skilled  Nursing Facility level of care (*CSW will initial, date and re-position this form in  chart as items are completed):  Yes   Patient/family provided with [facility] Clinical Social Work Department's list of facilities offering this level of care within the geographic area requested by the patient (or if unable, by the patient's family).  Yes   Patient/family informed of their freedom to choose among providers that offer the needed level of care, that participate in Medicare, Medicaid or managed care program needed by the patient, have an available bed and are willing to accept the patient.  Yes   Patient/family informed of [facility] ownership interest [facility] and [facility], as well as of the fact that they are under no obligation to receive care at these facilities.  PASRR submitted to EDS on 05/05/15     PASRR number received on 05/05/15     Existing PASRR number confirmed on       FL2 transmitted to all facilities in geographic area requested by pt/family on 05/05/15     FL2 transmitted to all facilities within larger geographic area on       Patient informed that his/her managed care company has contracts with or will negotiate with certain facilities, including the following:         YES - Patient/family informed of bed offers received.  [facility]     Physician recommends and patient chooses bed at      Patient to be transferred to  [facility] on  05/06/15.  Patient to be transferred to facility by  ambulance     Patient family notified on  05/06/15 of transfer.  Name of family member notified:   Monte FantasiaKim Candelaria - dau. [phone].     PHYSICIAN Please sign FL2     Additional Comment:    _______________________________________________ Cristobal Goldmannrawford, Jaima Janney Bradley, LCSW 05/06/2015, 3:01 PM

## 2015-05-22 ENCOUNTER — Ambulatory Visit (INDEPENDENT_AMBULATORY_CARE_PROVIDER_SITE_OTHER): Payer: Medicare Other | Admitting: Cardiology

## 2015-05-22 ENCOUNTER — Encounter: Payer: Self-pay | Admitting: Cardiology

## 2015-05-22 VITALS — BP 110/60 | HR 80 | Ht 69.0 in | Wt 176.0 lb

## 2015-05-22 DIAGNOSIS — I482 Chronic atrial fibrillation, unspecified: Secondary | ICD-10-CM

## 2015-05-22 DIAGNOSIS — I25810 Atherosclerosis of coronary artery bypass graft(s) without angina pectoris: Secondary | ICD-10-CM | POA: Diagnosis not present

## 2015-05-22 DIAGNOSIS — I4891 Unspecified atrial fibrillation: Secondary | ICD-10-CM | POA: Diagnosis not present

## 2015-05-22 DIAGNOSIS — J441 Chronic obstructive pulmonary disease with (acute) exacerbation: Secondary | ICD-10-CM | POA: Diagnosis not present

## 2015-05-22 DIAGNOSIS — N390 Urinary tract infection, site not specified: Secondary | ICD-10-CM | POA: Diagnosis not present

## 2015-05-22 MED ORDER — DIGOXIN 62.5 MCG PO TABS: 0.0625 mg | ORAL_TABLET | Freq: Every day | ORAL | Status: DC

## 2015-05-22 NOTE — Progress Notes (Signed)
Cardiology Office Note   Date:  05/22/2015   ID:  [name], DOB 18-Nov-1929, MRN [id number]  PCP:  Kaleen Mask, MD  Cardiologist: Dr. Delton See    Chief Complaint  Patient presents with  . Atrial Fibrillation      History of Present Illness: [name] is a 82 y.o. female who presents for post hospital for increasing confusion, probably from UTI. Vs hypoxia.   She has hx in 11/2014 of sepsis,AMS, severe-copd, elevated LFTs and rapid VR of her chronic a fib. Pt recovered and has been in SNF. Now at home her granddaughter stays with her and family members stay on the property. With hypotension in hospital in August her cardizem was changed to 120 every 8 hours. She is not anticoagulation candidate due to GI bleed in the past. Cardiac hx with a fib, chronic , CAD with CABG in 2005 with LIMA->LAD, rSVG->diag, seq r SVG -> 1st OM and distal LCX, rSVG->PDA. Last cath 06/2012:  Severe native coronary disease as described.  Severe proximal stenosis of the SVG-D2: Subtotal, 99% occlusion  Successful PTCA/PCI of the possible SVG-D2 with a Veriflex BMS 3.0 mm x 20 mm, postdilated to 3.4 mm  Widely patent SVGs to both the RPDA and sequential to OM1-OM 2  Well-preserved EF by LV gram however there is evidence of significant MR.  Last echo 10/2014  Study Conclusions - Left ventricle: The cavity size was normal. Wall thickness was increased in a pattern of mild LVH. Systolic function was normal. The estimated ejection fraction was in the range of 55% to 60%. Regional wall motion abnormalities cannot be excluded. - Mitral valve: There was mild regurgitation. - Left atrium: The atrium was moderately dilated. - Right atrium: The atrium was mildly dilated. - Pulmonary arteries: PA peak pressure: 35 mm Hg (S).  She was hospitalized on 03/20/2015 with a COPD exacerbation. Cards consulted on 03/19/2015 for atrial fibrillation w/ RVR. She continued to be tachycardic  she saw the hospitalization and her Cardizem CD was increased to 240 mg daily On last visit with Dr. Delton See 04/01/15 her HR was elevated up to 140.  Visit 04/22/16 with wt loss and lethargy.  She eventually went to ER on the 30th for confusion.  COPD exacerbation and meds adjusted.    She was discharged to [facility] for rehab. It was stated she had bradcardia but HR while do have some pauses her HR was also in the 120s at times.  Her metoprolol was stopped and her cardizem decreased.  Her dig level was 1.6 and dig continued.   Today more alert and denies chest pain or SOB.  Her family stated she may leave [facility] by Monday and family are planning to care for her at home.  They fear she will have another UTI- she has had 3 in last few months that require hospitalization due to confusion.  She also has COPD.    Her A fib is controlled today. No anticoagulation due to GI bleed.      Past Medical History  Diagnosis Date  . CAD (coronary artery disease)     a. s/p CABG x5 (2005) LIMA->LAD, rSVG->diag, seq r SVG -> 1st OM and distal LCX, rSVG->PDA b. s/p BMS-prox SVG-D2 (2014)  . Hypertension   . Hyperlipidemia   . GI bleed 2008    AVMs  . Aneurysm, aortic (HCC)     thoracic aorta, stable at 4.1 cm, chest CT, May, 2012  . Syncope  Nitroglycerin plus a diuretic, April, 2009  . Hyponatremia     Chronic. Felt secondary to SIADH   . COPD (chronic obstructive pulmonary disease) (HCC)     on home oxygen  . Tobacco abuse   . Bradycardia   . Carotid artery disease (HCC)     Hx of endarterectomy. Doppler October, 2011, stable, 0-39% RIC A., 40-59% LICA  . Tuberculosis     Exposures to tuberculosis 1970s, has tested negative by the health Department  . Chronic atrial fibrillation (HCC)     Not felt to be an anticoagulation candidate secondary to hx of GIB/AVM  . Venous stasis of lower extremity     Chronic  . CHF with left ventricular diastolic dysfunction, NYHA class 2 (HCC)      a. EF 55-60%, echo, April, 2013 b. EF 55-60%, mild biatrial enlargement and PASP 42 mmH  . AAA (abdominal aortic aneurysm) (HCC)   . Complication of anesthesia   . Hx of CABG     CABG 2005  . Hematoma     Right groin hematoma, small, post cath, April, 2014  . Mitral regurgitation     Mild, hospital, March, 2014  . Leg ulcer (HCC)     Ulcerated lesion on the anterior aspect of her left lower leg, June, 2014  . Type 2 diabetes mellitus [facility])     Past Surgical History  Procedure Laterality Date  . Cholecystectomy    . Carotid endartercetomy    . Abdominal hysterectomy    . Coronary artery bypass graft  2005  . Coronary angioplasty with stent placement  07/18/12    severe native coronary artery disease, 99% proximal stenosis of the SVG-D2 status post successful PTCA/PCI with a Veriflex bare-metal stent, widely patent SVGs to both the right PDA sequential OM1 to OM 2, EF 65-70% and 3+ mitral regurgitation  . Left heart catheterization with coronary angiogram N/A 07/18/2012    Procedure: LEFT HEART CATHETERIZATION WITH CORONARY ANGIOGRAM;  Surgeon: Marykay Lex, MD;  Location: [facility] CATH LAB;  Service: Cardiovascular;  Laterality: N/A;  . Percutaneous coronary stent intervention (pci-s)  07/18/2012    Procedure: PERCUTANEOUS CORONARY STENT INTERVENTION (PCI-S);  Surgeon: Marykay Lex, MD;  Location: [facility] CATH LAB;  Service: Cardiovascular;;  . Ercp N/A 11/20/2014    Procedure: ENDOSCOPIC RETROGRADE CHOLANGIOPANCREATOGRAPHY (ERCP);  Surgeon: Meryl Dare, MD;  Location: [facility] ENDOSCOPY;  Service: Endoscopy;  Laterality: N/A;     Current Outpatient Prescriptions  Medication Sig Dispense Refill  . albuterol (PROVENTIL HFA;VENTOLIN HFA) 108 (90 BASE) MCG/ACT inhaler Inhale 2 puffs into the lungs every 4 (four) hours as needed for wheezing or shortness of breath. 1 Inhaler 5  . albuterol (PROVENTIL) (2.5 MG/3ML) 0.083% nebulizer solution Take 2.5 mg by nebulization every 6 (six) hours as needed  for wheezing.    Marland Kitchen ALPRAZolam (XANAX) 0.25 MG tablet Take 1 tablet (0.25 mg total) by mouth at bedtime as needed for anxiety. 10 tablet 0  . Amino Acids-Protein Hydrolys (FEEDING SUPPLEMENT, PRO-STAT SUGAR FREE 64,) LIQD Take 30 mLs by mouth daily. 900 mL 0  . aspirin EC 81 MG tablet Take 81 mg by mouth daily.    Marland Kitchen atorvastatin (LIPITOR) 40 MG tablet Take 0.5 tablets (20 mg total) by mouth daily at 6 PM. 90 tablet 1  . digoxin 62.5 MCG TABS Take 0.0625 mg by mouth daily. 90 tablet 3  . diltiazem (DILACOR XR) 120 MG 24 hr capsule Take 1 capsule (120 mg total) by mouth  daily. 30 capsule 0  . feeding supplement, ENSURE ENLIVE, (ENSURE ENLIVE) LIQD Take 237 mLs by mouth daily. 237 mL 12  . furosemide (LASIX) 40 MG tablet Take 1 tablet (40 mg total) by mouth daily. 90 tablet 0  . insulin aspart (NOVOLOG) 100 UNIT/ML injection Before each meal 3 times a day, 140-199 - 2 units, 200-250 - 4 units, 251-299 - 6 units,  300-349 - 8 units,  350 or above 10 units. Dispense syringes and needles as needed, Ok to switch to PEN if approved. Substitute to any brand approved. DX DM2, Code E11.65 1 vial 12  . insulin glargine (LANTUS) 100 UNIT/ML injection Inject 0.1 mLs (10 Units total) into the skin at bedtime. 10 mL 11  . nitroGLYCERIN (NITROSTAT) 0.4 MG SL tablet Place 0.4 mg under the tongue every 5 (five) minutes as needed for chest pain.    Marland Kitchen ondansetron (ZOFRAN) 8 MG tablet Take 1 tablet (8 mg total) by mouth every 8 (eight) hours as needed for nausea or vomiting. 20 tablet 0  . OXYGEN Inhale into the lungs. 2L continously    . pantoprazole (PROTONIX) 40 MG tablet Take 1 tablet (40 mg total) by mouth daily. 90 tablet 3  . polyethylene glycol (MIRALAX / GLYCOLAX) packet Take 17 g by mouth daily as needed for mild constipation or moderate constipation.     . potassium chloride SA (K-DUR,KLOR-CON) 20 MEQ tablet Take 1 tablet (20 mEq total) by mouth daily. 30 tablet 0  . predniSONE (DELTASONE) 10 MG tablet Take 40  mg daily for 2 days,Take 30 mg daily for 2 days,Take 20 mg daily for 2 days,Take 10 mg daily for 2 days, then stop 20 tablet 0  . senna (SENOKOT) 8.6 MG TABS tablet Take 8.6 mg by mouth daily as needed for mild constipation or moderate constipation.     Marland Kitchen SPIRIVA HANDIHALER 18 MCG inhalation capsule INHALE THE CONTENTS OF 1 CAPSULE DAILY 90 capsule 0  . SYMBICORT 160-4.5 MCG/ACT inhaler USE 2 INHALATIONS TWICE A DAY 30.6 g 0   No current facility-administered medications for this visit.    Allergies:   Other; Codeine; Sulfonamide derivatives; Warfarin; Penicillins; and Ranitidine hcl    Social History:  The patient  reports that she quit smoking about 2 years ago. Her smoking use included Cigarettes. She has a 29.5 pack-year smoking history. She does not have any smokeless tobacco history on file. She reports that she does not drink alcohol or use illicit drugs.   Family History:  The patient's family history includes Cancer in her brother and sister; Heart failure in her mother; Stroke in her sister. There is no history of Heart attack or Hypertension.    ROS:  General:no colds or fevers- recent UTI and COPD exacerbation, no weight changes Skin:no rashes or ulcers HEENT:no blurred vision, no congestion CV:see HPI PUL:see HPI GI:no diarrhea constipation or melena, no indigestion GU:no hematuria, no dysuria MS:no joint pain, no claudication Neuro:no syncope, no lightheadedness Endo:+ diabetes, no thyroid disease Wt Readings from Last 3 Encounters:  05/22/15 176 lb (79.833 kg)  05/05/15 172 lb 13.5 oz (78.4 kg)  04/23/15 165 lb (74.844 kg)     PHYSICAL EXAM: VS:  BP 110/60 mmHg  Pulse 80  Ht  (1.753 m)  Wt 176 lb (79.833 kg)  BMI 25.98 kg/m2  SpO2 96% , BMI Body mass index is 25.98 kg/(m^2). General:Pleasant affect, NAD- not very interactive Skin:Warm and dry, brisk capillary refill HEENT:normocephalic, sclera clear, mucus membranes  moist Neck:supple, no JVD, no bruits    Heart:irreg irreg without murmur, gallup, rub or click Lungs:diminished without rales, rhonchi, or wheezes ZOX:WRUE, non tender, + BS, do not palpate liver spleen or masses Ext:no lower ext edema, 2+ pedal pulses, 2+ radial pulses Neuro:alert and oriented, MAE, follows commands, + facial symmetry    EKG:  EKG is ordered today. The ekg ordered today demonstrates a fib rate controlled with LAD, LVH, ST abnormality.   Recent Labs: 11/18/2014: TSH 0.702 03/18/2015: B Natriuretic Peptide 153.9* 05/01/2015: ALT 12* 05/05/2015: BUN 27*; Creatinine, Ser 1.18*; Magnesium 2.5*; Potassium 4.5; Sodium 139 05/06/2015: Hemoglobin 10.9*; Platelets 225    Lipid Panel    Component Value Date/Time   CHOL 134 04/17/2015 0946   TRIG 127 04/17/2015 0946   HDL 56 04/17/2015 0946   CHOLHDL 2.4 04/17/2015 0946   VLDL 25 04/17/2015 0946   LDLCALC 53 04/17/2015 0946       Other studies Reviewed: Additional studies/ records that were reviewed today include: discharge summary, . Previous notes  ASSESSMENT AND PLAN:  1.  Permanent atrial fib rate controlled.  Dig level 1.6 will decrease dig to 0.625 daily.  No anticoagulation due to GI bleed.  If HR increases would increase dilt.  2.  Chronic diastolic HF euvolemic  3.  HTN controlled  4. Severe COPD to see Dr. Vassie Loll 06/12/15  5. Recurrent UTI will arrange Urology appt   keep appt with Dr. Delton See   Current medicines are reviewed with the patient today.  The patient Has no concerns regarding medicines.  The following changes have been made:  See above Labs/ tests ordered today include:see above  Disposition:   FU:  see above  Nyoka Lint, NP  05/22/2015 11:43 PM    [facility] [address] [place], [place], [place]  [address]/ [address] [place], [place] Phone: [phone]; Fax: [phone]  [phone]

## 2015-05-22 NOTE — Patient Instructions (Addendum)
Medication Instructions:  Your physician has recommended you make the following change in your medication:  1-Decrease Digoxin 0.0625 by mouth daily  Labwork: NONE  Testing/Procedures: NONE  Follow-Up: Your physician recommends that keep your follow-up appointment with Dr. Delton See.  You have been referred to urology for frequent UTI.   Please call our office if your heart rate is elevated or you get short of breath. [phone]   If you need a refill on your cardiac medications before your next appointment, please call your pharmacy.

## 2015-06-03 ENCOUNTER — Other Ambulatory Visit: Payer: Self-pay | Admitting: Pulmonary Disease

## 2015-06-12 ENCOUNTER — Ambulatory Visit (INDEPENDENT_AMBULATORY_CARE_PROVIDER_SITE_OTHER): Payer: Medicare Other | Admitting: Pulmonary Disease

## 2015-06-12 ENCOUNTER — Encounter: Payer: Self-pay | Admitting: Pulmonary Disease

## 2015-06-12 VITALS — BP 110/60 | HR 87 | Ht 72.0 in | Wt 161.2 lb

## 2015-06-12 DIAGNOSIS — J9611 Chronic respiratory failure with hypoxia: Secondary | ICD-10-CM

## 2015-06-12 DIAGNOSIS — J441 Chronic obstructive pulmonary disease with (acute) exacerbation: Secondary | ICD-10-CM

## 2015-06-12 NOTE — Progress Notes (Signed)
   Subjective:    Patient ID: [name], female    DOB: November 11, 1929, 82 y.o.   MRN: [id number]  HPI  85/F , smoker presents for FU of gold D COPD, 2-3 admits/ yr.  She has been on home O2 since dec 2010 ([facility]). She smoked 1/2 PPD, about 66 Pyrs - chantix made her sick & she is afraid of cardiac side effects with patches. She sees Dr Myrtis Ser for CAD & has tolerated lisinopril & metoprolol, echo 7/10 showed nml LV fn & non dilated RV. An episode of syncope in 2009 was attributed to NTG & diuretics.       06/12/2015  Chief Complaint  Patient presents with  . Follow-up    CAT score: 28; chest tightness, fatigue, sob   Accompanied by granddaughter [name] who is her daytime caregiver 2 hospital visits in the last 3 months-last one in 04/2015 for UTI followed [facility] O2 sat was low on arrival, oxygen increased to 2.5 L with improvement and sat to 88% Her son [name] his POA, and has insisted on full life support during her hospital admissions She clearly states today that she would not want extended life support  On Spiriva and Symbicort  Flu  And Prevnar utd.  She is now off prednisone She has not had a flare of cough, wheezing or dyspnea with lowering the prednisone.  She denies hemoptysis.   Significant tests/ events  PFTs 08/19/09 >> severe airway obstruction, FEV1 47%, no BD response, air trapping +, severe decrease in diffusion  CT angio 5/12 asc aortic aneurysm 41mm, hepatic cysts  Hosp 06/2011 for New paroxysmal atrial fibrillation with RVR and COPD exacerbation  - not felt to be a coumadin candidate secondary to hx of GIB/AVMs   Hosp adm 07/2012 for stent, 09/2012 for LLE cellulitis & bleeding, ABI (demonstarted moderate Left lower extremity PAD)   Review of Systems No chest pain, orthopnea, edema or fever.   neg for any significant sore throat, dysphagia, itching, sneezing, nasal congestion or excess/ purulent secretions,  fever, chills, sweats, unintended wt loss, pleuritic or exertional cp, hempoptysis, orthopnea pnd or change in chronic leg swelling.  Also denies presyncope, palpitations, heartburn, abdominal pain, nausea, vomiting, diarrhea or change in bowel or urinary habits, dysuria,hematuria, rash, arthralgias, visual complaints, headache, numbness weakness or ataxia.     Objective:   Physical Exam  Gen. Pleasant, well-nourished, in no distress, wheelchair, on nasal cannula ENT - no thrush, no post nasal drip Neck: No JVD, no thyromegaly, no carotid bruits Lungs: no use of accessory muscles, no dullness to percussion, decreased bilateral without rales or rhonchi  Cardiovascular: Rhythm regular, heart sounds  normal, no murmurs or gallops, no peripheral edema Musculoskeletal: No deformities, no cyanosis or clubbing        Assessment & Plan:

## 2015-06-12 NOTE — Assessment & Plan Note (Addendum)
Stay on Symbicort and Spiriva Use albuterol as needed only Arrange for next visit with your son [name], her POA-so that we can discuss advanced directives in some detail Clearly her repeated admissions are a poor prognostic sign

## 2015-06-12 NOTE — Assessment & Plan Note (Signed)
Keep oxygen between 2 and 2.5 L

## 2015-06-12 NOTE — Patient Instructions (Signed)
Keep oxygen between 2 and 2.5 L Stay on Symbicort and Spiriva Use albuterol as needed only Arrange for next visit with your son [name]

## 2015-06-26 ENCOUNTER — Encounter: Payer: Self-pay | Admitting: *Deleted

## 2015-07-01 ENCOUNTER — Encounter: Payer: Self-pay | Admitting: Cardiology

## 2015-07-01 ENCOUNTER — Ambulatory Visit (INDEPENDENT_AMBULATORY_CARE_PROVIDER_SITE_OTHER): Payer: Medicare Other | Admitting: Cardiology

## 2015-07-01 VITALS — BP 140/70 | HR 74 | Ht 72.0 in | Wt 158.0 lb

## 2015-07-01 DIAGNOSIS — J441 Chronic obstructive pulmonary disease with (acute) exacerbation: Secondary | ICD-10-CM | POA: Diagnosis not present

## 2015-07-01 DIAGNOSIS — I5033 Acute on chronic diastolic (congestive) heart failure: Secondary | ICD-10-CM | POA: Diagnosis not present

## 2015-07-01 DIAGNOSIS — I4891 Unspecified atrial fibrillation: Secondary | ICD-10-CM

## 2015-07-01 DIAGNOSIS — I10 Essential (primary) hypertension: Secondary | ICD-10-CM

## 2015-07-01 MED ORDER — DILTIAZEM HCL ER 120 MG PO CP24: 120.0000 mg | ORAL_CAPSULE | Freq: Every day | ORAL | Status: DC

## 2015-07-01 MED ORDER — DIGOXIN 62.5 MCG PO TABS: 0.0625 mg | ORAL_TABLET | Freq: Every day | ORAL | Status: DC

## 2015-07-01 NOTE — Patient Instructions (Signed)
Medication Instructions:   Your physician recommends that you continue on your current medications as directed. Please refer to the Current Medication list given to you today.     Follow-Up:  3 MONTHS WITH DR NELSON       If you need a refill on your cardiac medications before your next appointment, please call your pharmacy.   

## 2015-07-01 NOTE — Progress Notes (Signed)
Patient ID: [name], female   DOB: 05/22/1929, 82 y.o.   MRN: [id number]      Cardiology Office Note  Date:  07/01/2015   ID:  [name], DOB 07/31/1929, MRN [id number]  PCP:  Dois Davenport., MD  Cardiologist: Dr. Delton See    Chief complain: Chronic shortness of breath.   History of Present Illness: [name] is a 82 y.o. female who presents for post hospital for increasing confusion, probably from UTI. Vs hypoxia.   She has hx in 11/2014 of sepsis,AMS, severe-copd, elevated LFTs and rapid VR of her chronic a fib. Pt recovered and has been in SNF. Now at home her granddaughter stays with her and family members stay on the property. With hypotension in hospital in August her cardizem was changed to 120 every 8 hours. She is not anticoagulation candidate due to GI bleed in the past. Cardiac hx with a fib, chronic , CAD with CABG in 2005 with LIMA->LAD, rSVG->diag, seq r SVG -> 1st OM and distal LCX, rSVG->PDA. Last cath 06/2012:  Severe native coronary disease as described.  Severe proximal stenosis of the SVG-D2: Subtotal, 99% occlusion  Successful PTCA/PCI of the possible SVG-D2 with a Veriflex BMS 3.0 mm x 20 mm, postdilated to 3.4 mm  Widely patent SVGs to both the RPDA and sequential to OM1-OM 2  Well-preserved EF by LV gram however there is evidence of significant MR.  Last echo 10/2014  Study Conclusions - Left ventricle: The cavity size was normal. Wall thickness was increased in a pattern of mild LVH. Systolic function was normal. The estimated ejection fraction was in the range of 55% to 60%. Regional wall motion abnormalities cannot be excluded. - Mitral valve: There was mild regurgitation. - Left atrium: The atrium was moderately dilated. - Right atrium: The atrium was mildly dilated. - Pulmonary arteries: PA peak pressure: 35 mm Hg (S).  She was hospitalized on 03/20/2015 with a COPD exacerbation. Cards consulted on 03/19/2015 for atrial  fibrillation w/ RVR. She continued to be tachycardic she saw the hospitalization and her Cardizem CD was increased to 240 mg daily On last visit with Dr. Delton See 04/01/15 her HR was elevated up to 140.  07/02/15 - 6 weeks follow-up, patient states she has been stable from cardiac standpoint she has been experiencing chronic stable shortness of breath and uses oxygen 24/7 at home. She denies any palpitations or syncope. No orthopnea or paroxysmal nocturnal dyspnea. She is using very low-dose of digoxin and her heart rate has been controlled, she is only on aspirin and she has history of GI bleed.  Past Medical History  Diagnosis Date  . CAD (coronary artery disease)     a. s/p CABG x5 (2005) LIMA->LAD, rSVG->diag, seq r SVG -> 1st OM and distal LCX, rSVG->PDA b. s/p BMS-prox SVG-D2 (2014)  . Hypertension   . Hyperlipidemia   . GI bleed 2008    AVMs  . Aneurysm, aortic (HCC)     thoracic aorta, stable at 4.1 cm, chest CT, May, 2012  . Syncope     Nitroglycerin plus a diuretic, April, 2009  . Hyponatremia     Chronic. Felt secondary to SIADH   . COPD (chronic obstructive pulmonary disease) (HCC)     on home oxygen  . Tobacco abuse   . Bradycardia   . Carotid artery disease (HCC)     Hx of endarterectomy. Doppler October, 2011, stable, 0-39% RIC A., 40-59% LICA  . Tuberculosis  Exposures to tuberculosis 1970s, has tested negative by the health Department  . Chronic atrial fibrillation (HCC)     Not felt to be an anticoagulation candidate secondary to hx of GIB/AVM  . Venous stasis of lower extremity     Chronic  . CHF with left ventricular diastolic dysfunction, NYHA class 2 (HCC)     a. EF 55-60%, echo, April, 2013 b. EF 55-60%, mild biatrial enlargement and PASP 42 mmH  . AAA (abdominal aortic aneurysm) (HCC)   . Complication of anesthesia   . Hx of CABG     CABG 2005  . Hematoma     Right groin hematoma, small, post cath, April, 2014  . Mitral regurgitation     Mild, hospital,  March, 2014  . Leg ulcer (HCC)     Ulcerated lesion on the anterior aspect of her left lower leg, June, 2014  . Type 2 diabetes mellitus [facility])    Past Surgical History  Procedure Laterality Date  . Cholecystectomy    . Carotid endartercetomy    . Abdominal hysterectomy    . Coronary artery bypass graft  2005  . Coronary angioplasty with stent placement  07/18/12    severe native coronary artery disease, 99% proximal stenosis of the SVG-D2 status post successful PTCA/PCI with a Veriflex bare-metal stent, widely patent SVGs to both the right PDA sequential OM1 to OM 2, EF 65-70% and 3+ mitral regurgitation  . Left heart catheterization with coronary angiogram N/A 07/18/2012    Procedure: LEFT HEART CATHETERIZATION WITH CORONARY ANGIOGRAM;  Surgeon: Marykay Lex, MD;  Location: [facility] CATH LAB;  Service: Cardiovascular;  Laterality: N/A;  . Percutaneous coronary stent intervention (pci-s)  07/18/2012    Procedure: PERCUTANEOUS CORONARY STENT INTERVENTION (PCI-S);  Surgeon: Marykay Lex, MD;  Location: [facility] CATH LAB;  Service: Cardiovascular;;  . Ercp N/A 11/20/2014    Procedure: ENDOSCOPIC RETROGRADE CHOLANGIOPANCREATOGRAPHY (ERCP);  Surgeon: Meryl Dare, MD;  Location: [facility] ENDOSCOPY;  Service: Endoscopy;  Laterality: N/A;   Current Outpatient Prescriptions  Medication Sig Dispense Refill  . albuterol (PROVENTIL HFA;VENTOLIN HFA) 108 (90 BASE) MCG/ACT inhaler Inhale 2 puffs into the lungs every 4 (four) hours as needed for wheezing or shortness of breath. 1 Inhaler 5  . albuterol (PROVENTIL) (2.5 MG/3ML) 0.083% nebulizer solution Take 2.5 mg by nebulization every 6 (six) hours as needed for wheezing.    Marland Kitchen ALPRAZolam (XANAX) 0.25 MG tablet Take 1 tablet (0.25 mg total) by mouth at bedtime as needed for anxiety. 10 tablet 0  . Amino Acids-Protein Hydrolys (FEEDING SUPPLEMENT, PRO-STAT SUGAR FREE 64,) LIQD Take 30 mLs by mouth daily. 900 mL 0  . aspirin EC 81 MG tablet Take 81 mg by mouth daily.     Marland Kitchen atorvastatin (LIPITOR) 40 MG tablet Take 0.5 tablets (20 mg total) by mouth daily at 6 PM. 90 tablet 1  . digoxin 62.5 MCG TABS Take 0.0625 mg by mouth daily. (Patient taking differently: Take 0.125 mg by mouth daily. Taking 1/2 tablet) 90 tablet 3  . diltiazem (DILACOR XR) 120 MG 24 hr capsule Take 1 capsule (120 mg total) by mouth daily. 30 capsule 0  . feeding supplement, ENSURE ENLIVE, (ENSURE ENLIVE) LIQD Take 237 mLs by mouth daily. 237 mL 12  . furosemide (LASIX) 40 MG tablet Take 1 tablet (40 mg total) by mouth daily. 90 tablet 0  . insulin aspart (NOVOLOG) 100 UNIT/ML injection Before each meal 3 times a day, 140-199 - 2 units, 200-250 - 4  units, 251-299 - 6 units,  300-349 - 8 units,  350 or above 10 units. Dispense syringes and needles as needed, Ok to switch to PEN if approved. Substitute to any brand approved. DX DM2, Code E11.65 1 vial 12  . insulin glargine (LANTUS) 100 UNIT/ML injection Inject 0.1 mLs (10 Units total) into the skin at bedtime. 10 mL 11  . nitroGLYCERIN (NITROSTAT) 0.4 MG SL tablet Place 0.4 mg under the tongue every 5 (five) minutes as needed for chest pain.    Marland Kitchen ondansetron (ZOFRAN) 8 MG tablet Take 1 tablet (8 mg total) by mouth every 8 (eight) hours as needed for nausea or vomiting. 20 tablet 0  . OXYGEN Inhale into the lungs. 2L continously    . pantoprazole (PROTONIX) 40 MG tablet Take 1 tablet (40 mg total) by mouth daily. 90 tablet 3  . polyethylene glycol (MIRALAX / GLYCOLAX) packet Take 17 g by mouth daily as needed for mild constipation or moderate constipation.     . potassium chloride SA (K-DUR,KLOR-CON) 20 MEQ tablet Take 1 tablet (20 mEq total) by mouth daily. 30 tablet 0  . senna (SENOKOT) 8.6 MG TABS tablet Take 8.6 mg by mouth daily as needed for mild constipation or moderate constipation.     Marland Kitchen SPIRIVA HANDIHALER 18 MCG inhalation capsule INHALE THE CONTENTS OF 1 CAPSULE ONCE DAILY 90 capsule 1  . SYMBICORT 160-4.5 MCG/ACT inhaler USE 2  INHALATIONS TWICE A DAY 30.6 g 0   No current facility-administered medications for this visit.   Allergies:   Other; Codeine; Sulfonamide derivatives; Warfarin; Penicillins; and Ranitidine hcl   Social History:  The patient  reports that she quit smoking about 2 years ago. Her smoking use included Cigarettes. She has a 29.5 pack-year smoking history. She does not have any smokeless tobacco history on file. She reports that she does not drink alcohol or use illicit drugs.  Family History:  The patient's family history includes Breast cancer in her brother and sister; Congestive Heart Failure in her mother; Lung cancer in her father and sister; Prostate cancer in her brother; Stroke in her sister. There is no history of Heart attack or Hypertension.   ROS:  General:no colds or fevers- recent UTI and COPD exacerbation, no weight changes Skin:no rashes or ulcers HEENT:no blurred vision, no congestion CV:see HPI PUL:see HPI GI:no diarrhea constipation or melena, no indigestion GU:no hematuria, no dysuria MS:no joint pain, no claudication Neuro:no syncope, no lightheadedness Endo:+ diabetes, no thyroid disease Wt Readings from Last 3 Encounters:  07/01/15 158 lb (71.668 kg)  06/12/15 161 lb 3.2 oz (73.12 kg)  05/22/15 176 lb (79.833 kg)    PHYSICAL EXAM: VS:  BP 140/70 mmHg  Pulse 74  Ht 6' (1.829 m)  Wt 158 lb (71.668 kg)  BMI 21.42 kg/m2 , BMI Body mass index is 21.42 kg/(m^2). General:Pleasant affect, NAD- not very interactive Skin:Warm and dry, brisk capillary refill HEENT:normocephalic, sclera clear, mucus membranes moist Neck:supple, no JVD, no bruits  Heart:irreg irreg without murmur, gallup, rub or click Lungs:diminished without rales, rhonchi, or wheezes JYN:WGNF, non tender, + BS, do not palpate liver spleen or masses Ext:no lower ext edema, 2+ pedal pulses, 2+ radial pulses Neuro:alert and oriented, MAE, follows commands, + facial symmetry  EKG:  EKG is ordered  today. The ekg ordered today demonstrates a fib rate controlled with LAD, LVH, ST abnormality.  Recent Labs: 11/18/2014: TSH 0.702 03/18/2015: B Natriuretic Peptide 153.9* 05/01/2015: ALT 12* 05/05/2015: BUN 27*; Creatinine, Ser 1.18*;  Magnesium 2.5*; Potassium 4.5; Sodium 139 05/06/2015: Hemoglobin 10.9*; Platelets 225   Lipid Panel    Component Value Date/Time   CHOL 134 04/17/2015 0946   TRIG 127 04/17/2015 0946   HDL 56 04/17/2015 0946   CHOLHDL 2.4 04/17/2015 0946   VLDL 25 04/17/2015 0946   LDLCALC 53 04/17/2015 0946     ASSESSMENT AND PLAN:  1.  Permanent atrial fib rate controlled.  Dig level 1.6 in January 2017, digoxin was increased to  0.625 daily, her rate is well controlled.  No anticoagulation due to GI bleed.  If HR increases would increase dilt.  2.  Chronic diastolic CHF - she is euvolemic.  3.  HTN - controlled  4. Severe COPD - severe, followed by Dr. Vassie LollAlva 06/12/15, resting SpO2 in 80'.  Follow up in 3 months.  Signed, Lars MassonNELSON, Vaeda Westall H, MD  07/01/2015 11:37 AM    [facility] [address], [place], [place]  [address]/ [address] [place], [place] Phone: [phone]; Fax: [phone]) [phone]  [phone]

## 2015-07-10 ENCOUNTER — Encounter (HOSPITAL_COMMUNITY): Payer: Self-pay

## 2015-07-10 ENCOUNTER — Emergency Department (HOSPITAL_COMMUNITY)
Admission: EM | Admit: 2015-07-10 | Discharge: 2015-07-10 | Disposition: A | Discharge status: Home / Self Care | Payer: Medicare Other | Attending: Emergency Medicine | Admitting: Emergency Medicine

## 2015-07-10 ENCOUNTER — Emergency Department (HOSPITAL_COMMUNITY): Payer: Medicare Other

## 2015-07-10 DIAGNOSIS — Z8611 Personal history of tuberculosis: Secondary | ICD-10-CM | POA: Insufficient documentation

## 2015-07-10 DIAGNOSIS — I482 Chronic atrial fibrillation, unspecified: Secondary | ICD-10-CM

## 2015-07-10 DIAGNOSIS — I10 Essential (primary) hypertension: Secondary | ICD-10-CM | POA: Diagnosis not present

## 2015-07-10 DIAGNOSIS — Z8701 Personal history of pneumonia (recurrent): Secondary | ICD-10-CM | POA: Diagnosis not present

## 2015-07-10 DIAGNOSIS — Z880 Allergy status to penicillin: Secondary | ICD-10-CM | POA: Diagnosis not present

## 2015-07-10 DIAGNOSIS — F419 Anxiety disorder, unspecified: Secondary | ICD-10-CM | POA: Diagnosis not present

## 2015-07-10 DIAGNOSIS — Z951 Presence of aortocoronary bypass graft: Secondary | ICD-10-CM | POA: Insufficient documentation

## 2015-07-10 DIAGNOSIS — Z9981 Dependence on supplemental oxygen: Secondary | ICD-10-CM | POA: Insufficient documentation

## 2015-07-10 DIAGNOSIS — Z7952 Long term (current) use of systemic steroids: Secondary | ICD-10-CM | POA: Insufficient documentation

## 2015-07-10 DIAGNOSIS — R0602 Shortness of breath: Secondary | ICD-10-CM | POA: Diagnosis present

## 2015-07-10 DIAGNOSIS — Z79899 Other long term (current) drug therapy: Secondary | ICD-10-CM | POA: Diagnosis not present

## 2015-07-10 DIAGNOSIS — Z9861 Coronary angioplasty status: Secondary | ICD-10-CM | POA: Diagnosis not present

## 2015-07-10 DIAGNOSIS — Z9889 Other specified postprocedural states: Secondary | ICD-10-CM | POA: Diagnosis not present

## 2015-07-10 DIAGNOSIS — Z87891 Personal history of nicotine dependence: Secondary | ICD-10-CM | POA: Insufficient documentation

## 2015-07-10 DIAGNOSIS — Z872 Personal history of diseases of the skin and subcutaneous tissue: Secondary | ICD-10-CM | POA: Diagnosis not present

## 2015-07-10 DIAGNOSIS — Z8719 Personal history of other diseases of the digestive system: Secondary | ICD-10-CM | POA: Insufficient documentation

## 2015-07-10 DIAGNOSIS — Z7982 Long term (current) use of aspirin: Secondary | ICD-10-CM | POA: Insufficient documentation

## 2015-07-10 DIAGNOSIS — Z794 Long term (current) use of insulin: Secondary | ICD-10-CM | POA: Insufficient documentation

## 2015-07-10 DIAGNOSIS — E119 Type 2 diabetes mellitus without complications: Secondary | ICD-10-CM | POA: Diagnosis not present

## 2015-07-10 DIAGNOSIS — J440 Chronic obstructive pulmonary disease with acute lower respiratory infection: Secondary | ICD-10-CM | POA: Insufficient documentation

## 2015-07-10 DIAGNOSIS — I2510 Atherosclerotic heart disease of native coronary artery without angina pectoris: Secondary | ICD-10-CM | POA: Diagnosis not present

## 2015-07-10 DIAGNOSIS — E785 Hyperlipidemia, unspecified: Secondary | ICD-10-CM | POA: Diagnosis not present

## 2015-07-10 LAB — COMPREHENSIVE METABOLIC PANEL
ALK PHOS: 62 U/L (ref 38–126)
ALT: 11 U/L — AB (ref 14–54)
AST: 18 U/L (ref 15–41)
Albumin: 3.3 g/dL — ABNORMAL LOW (ref 3.5–5.0)
Anion gap: 11 (ref 5–15)
BUN: 11 mg/dL (ref 6–20)
CALCIUM: 10.3 mg/dL (ref 8.9–10.3)
CHLORIDE: 96 mmol/L — AB (ref 101–111)
CO2: 31 mmol/L (ref 22–32)
CREATININE: 1.19 mg/dL — AB (ref 0.44–1.00)
GFR calc non Af Amer: 41 mL/min — ABNORMAL LOW (ref >60)
GFR, EST AFRICAN AMERICAN: 48 mL/min — AB (ref >60)
Glucose, Bld: 112 mg/dL — ABNORMAL HIGH (ref 65–99)
Potassium: 3.6 mmol/L (ref 3.5–5.1)
SODIUM: 138 mmol/L (ref 135–145)
Total Bilirubin: 0.6 mg/dL (ref 0.3–1.2)
Total Protein: 6 g/dL — ABNORMAL LOW (ref 6.5–8.1)

## 2015-07-10 LAB — CBC WITH DIFFERENTIAL/PLATELET
Basophils Absolute: 0 10*3/uL (ref 0.0–0.1)
Basophils Relative: 0 %
EOS PCT: 1 %
Eosinophils Absolute: 0.1 10*3/uL (ref 0.0–0.7)
HEMATOCRIT: 31.2 % — AB (ref 36.0–46.0)
Hemoglobin: 9.7 g/dL — ABNORMAL LOW (ref 12.0–15.0)
LYMPHS ABS: 0.7 10*3/uL (ref 0.7–4.0)
LYMPHS PCT: 8 %
MCH: 27.5 pg (ref 26.0–34.0)
MCHC: 31.1 g/dL (ref 30.0–36.0)
MCV: 88.4 fL (ref 78.0–100.0)
MONO ABS: 0.6 10*3/uL (ref 0.1–1.0)
MONOS PCT: 7 %
NEUTROS ABS: 6.9 10*3/uL (ref 1.7–7.7)
Neutrophils Relative %: 84 %
PLATELETS: 222 10*3/uL (ref 150–400)
RBC: 3.53 MIL/uL — ABNORMAL LOW (ref 3.87–5.11)
RDW: 14.3 % (ref 11.5–15.5)
WBC: 8.3 10*3/uL (ref 4.0–10.5)

## 2015-07-10 LAB — I-STAT ARTERIAL BLOOD GAS, ED
ACID-BASE EXCESS: 7 mmol/L — AB (ref 0.0–2.0)
BICARBONATE: 31.4 meq/L — AB (ref 20.0–24.0)
O2 SAT: 97 %
PO2 ART: 87 mmHg (ref 80.0–100.0)
TCO2: 33 mmol/L (ref 0–100)
pCO2 arterial: 43.9 mmHg (ref 35.0–45.0)
pH, Arterial: 7.462 — ABNORMAL HIGH (ref 7.350–7.450)

## 2015-07-10 LAB — DIGOXIN LEVEL: Digoxin Level: 0.4 ng/mL — ABNORMAL LOW (ref 0.8–2.0)

## 2015-07-10 LAB — TROPONIN I: TROPONIN I: 0.04 ng/mL — AB (ref <0.031)

## 2015-07-10 LAB — BRAIN NATRIURETIC PEPTIDE: B Natriuretic Peptide: 157.2 pg/mL — ABNORMAL HIGH (ref 0.0–100.0)

## 2015-07-10 MED ORDER — DIGOXIN 125 MCG PO TABS
0.0625 mg | ORAL_TABLET | Freq: Once | ORAL | Status: AC
  Administered 2015-07-10: 0.0625 mg via ORAL
  Filled 2015-07-10: qty 1

## 2015-07-10 MED ORDER — ALPRAZOLAM 0.25 MG PO TABS
0.2500 mg | ORAL_TABLET | Freq: Once | ORAL | Status: AC
  Administered 2015-07-10: 0.25 mg via ORAL
  Filled 2015-07-10: qty 1

## 2015-07-10 MED ORDER — IPRATROPIUM-ALBUTEROL 0.5-2.5 (3) MG/3ML IN SOLN
3.0000 mL | Freq: Once | RESPIRATORY_TRACT | Status: AC
  Administered 2015-07-10: 3 mL via RESPIRATORY_TRACT
  Filled 2015-07-10: qty 3

## 2015-07-10 MED ORDER — DILTIAZEM HCL ER COATED BEADS 120 MG PO CP24
120.0000 mg | ORAL_CAPSULE | Freq: Once | ORAL | Status: AC
  Administered 2015-07-10: 120 mg via ORAL
  Filled 2015-07-10: qty 1

## 2015-07-10 MED ORDER — FUROSEMIDE 20 MG PO TABS
40.0000 mg | ORAL_TABLET | Freq: Once | ORAL | Status: AC
  Administered 2015-07-10: 40 mg via ORAL
  Filled 2015-07-10: qty 2

## 2015-07-10 NOTE — Discharge Instructions (Signed)
Community-Acquired Pneumonia, Adult Complete your antibiotics as prescribed by your doctor. Finish your prednisone and use your albuterol inhaler or nebulizer every 4 hours. See your Lung doctor for recheck this week. Pneumonia is an infection of the lungs. There are different types of pneumonia. One type can develop while a person is in a hospital. A different type, called community-acquired pneumonia, develops in people who are not, or have not recently been, in the hospital or other health care facility.  CAUSES Pneumonia may be caused by bacteria, viruses, or funguses. Community-acquired pneumonia is often caused by Streptococcus pneumonia bacteria. These bacteria are often passed from one person to another by breathing in droplets from the cough or sneeze of an infected person. RISK FACTORS The condition is more likely to develop in:  People who havechronic diseases, such as chronic obstructive pulmonary disease (COPD), asthma, congestive heart failure, cystic fibrosis, diabetes, or kidney disease.  People who haveearly-stage or late-stage HIV.  People who havesickle cell disease.  People who havehad their spleen removed (splenectomy).  People who havepoor Administratordental hygiene.  People who havemedical conditions that increase the risk of breathing in (aspirating) secretions their own mouth and nose.   People who havea weakened immune system (immunocompromised).  People who smoke.  People whotravel to areas where pneumonia-causing germs commonly exist.  People whoare around animal habitats or animals that have pneumonia-causing germs, including birds, bats, rabbits, cats, and farm animals. SYMPTOMS Symptoms of this condition include:  Adry cough.  A wet (productive) cough.  Fever.  Sweating.  Chest pain, especially when breathing deeply or coughing.  Rapid breathing or difficulty breathing.  Shortness of breath.  Shaking chills.  Fatigue.  Muscle  aches. DIAGNOSIS Your health care provider will take a medical history and perform a physical exam. You may also have other tests, including:  Imaging studies of your chest, including X-rays.  Tests to check your blood oxygen level and other blood gases.  Other tests on blood, mucus (sputum), fluid around your lungs (pleural fluid), and urine. If your pneumonia is severe, other tests may be done to identify the specific cause of your illness. TREATMENT The type of treatment that you receive depends on many factors, such as the cause of your pneumonia, the medicines you take, and other medical conditions that you have. For most adults, treatment and recovery from pneumonia may occur at home. In some cases, treatment must happen in a hospital. Treatment may include:  Antibiotic medicines, if the pneumonia was caused by bacteria.  Antiviral medicines, if the pneumonia was caused by a virus.  Medicines that are given by mouth or through an IV tube.  Oxygen.  Respiratory therapy. Although rare, treating severe pneumonia may include:  Mechanical ventilation. This is done if you are not breathing well on your own and you cannot maintain a safe blood oxygen level.  Thoracentesis. This procedureremoves fluid around one lung or both lungs to help you breathe better. HOME CARE INSTRUCTIONS  Take over-the-counter and prescription medicines only as told by your health care provider.  Only takecough medicine if you are losing sleep. Understand that cough medicine can prevent your body's natural ability to remove mucus from your lungs.  If you were prescribed an antibiotic medicine, take it as told by your health care provider. Do not stop taking the antibiotic even if you start to feel better.  Sleep in a semi-upright position at night. Try sleeping in a reclining chair, or place a few pillows under your head.  Do not use tobacco products, including cigarettes, chewing tobacco, and  e-cigarettes. If you need help quitting, ask your health care provider.  Drink enough water to keep your urine clear or pale yellow. This will help to thin out mucus secretions in your lungs. PREVENTION There are ways that you can decrease your risk of developing community-acquired pneumonia. Consider getting a pneumococcal vaccine if:  You are older than 80 years of age.  You are older than 80 years of age and are undergoing cancer treatment, have chronic lung disease, or have other medical conditions that affect your immune system. Ask your health care provider if this applies to you. There are different types and schedules of pneumococcal vaccines. Ask your health care provider which vaccination option is best for you. You may also prevent community-acquired pneumonia if you take these actions:  Get an influenza vaccine every year. Ask your health care provider which type of influenza vaccine is best for you.  Go to the dentist on a regular basis.  Wash your hands often. Use hand sanitizer if soap and water are not available. SEEK MEDICAL CARE IF:  You have a fever.  You are losing sleep because you cannot control your cough with cough medicine. SEEK IMMEDIATE MEDICAL CARE IF:  You have worsening shortness of breath.  You have increased chest pain.  Your sickness becomes worse, especially if you are an older adult or have a weakened immune system.  You cough up blood.   This information is not intended to replace advice given to you by your health care provider. Make sure you discuss any questions you have with your health care provider.   Document Released: 04/13/2005 Document Revised: 01/02/2015 Document Reviewed: 08/08/2014 Elsevier Interactive Patient Education [employer].

## 2015-07-10 NOTE — Care Management Note (Signed)
Case Management Note  Patient Details  Name: [name] Needs MRN: [id number] Date of Birth: 06/28/1929  Subjective/Objective:                  82 yo Patient has multiple chronic underlying conditions of COPD, chronic H of fibrillation and anxiety. She has recent diagnosis of pneumonia on 3 days of Levaquin and prednisone. At this time, her symptoms have improved and she is not in acute respiratory distress. Diagnostic studies do not suggest worsening or advancing pneumonia.   Action/Plan: Follow for disposition needs; resume HH services adding Karen    Expected Discharge Date:       07/10/15           Expected Discharge Plan:  Home w Home Health Services  In-House Referral:  NA  Discharge planning Services  CM Consult  Post Acute Care Choice:  Resumption of Svcs/PTA Provider, Home Health Choice offered to:  NA  DME Arranged:  N/A DME Agency:  NA  HH Arranged:  PT, OT, Karen [facility]  Status of Service:  Completed, signed off  Medicare Important Message Given:    Date Medicare IM Given:    Medicare IM give by:    Date Additional Medicare IM Given:    Additional Medicare Important Message give by:     If discussed at Long Length of Stay Meetings, dates discussed:    Additional Comments: [facility].  Resumption of care with Karen services requested. [name], [name].  No DME needs identified at this time.  [name], Karen Lozada, Karen 07/10/2015, 11:40 AM

## 2015-07-10 NOTE — ED Notes (Signed)
Pt granddaughter called to come pick up pt.

## 2015-07-10 NOTE — ED Notes (Signed)
Pt was diagnosed with PNA on Monday and given Levaquin and Prednisone.  Pt brought in EMS for SOB since Monday and anxiety.  Per EMS pt was having difficulty breathing but once "calmed down" pt VSS and breathing effort improved.  Pt is in NAD and A&Ox4 upon arrival.

## 2015-07-10 NOTE — ED Provider Notes (Signed)
CSN: [id number]     Arrival date & time 07/10/15  0901 History   First MD Initiated Contact with Patient 07/10/15 [phone]     Chief Complaint  Patient presents with  . Shortness of Breath  . Anxiety     (Consider location/radiation/quality/duration/timing/severity/associated sxs/prior Treatment) HPI Patient has had cough for 5 days. She reports that her doctor started her on treatment for pneumonia 3 days ago. She has been taking Levaquin and prednisone. She reports she's had minimal sputum production with her coughing. She reports the cough comes in spells. She denies any associated chest pain. Today she felt more short of breath. EMS reports that upon arrival the patient was extremely anxious and had high heart rate. Per EMS, family members report this has been happening in spells. EMS report is that the patient had adequate air flow with decreased breath sounds at the bases but no active wheezing. They increased her oxygen and the patient began to calm down and heart rate slowed to 90s to low 100s. They reported that point patient had much improved appearance without other intervention. EMS reports they titrated her oxygen back down to her baseline 2 L now. At this time family members are not present. Patient's primary historian. She denies chest pain, abdominal pain, nausea, vomiting, lower extremity swelling or pain. Past Medical History  Diagnosis Date  . CAD (coronary artery disease)     a. s/p CABG x5 (2005) LIMA->LAD, rSVG->diag, seq r SVG -> 1st OM and distal LCX, rSVG->PDA b. s/p BMS-prox SVG-D2 (2014)  . Hypertension   . Hyperlipidemia   . GI bleed 2008    AVMs  . Aneurysm, aortic (HCC)     thoracic aorta, stable at 4.1 cm, chest CT, May, 2012  . Syncope     Nitroglycerin plus a diuretic, April, 2009  . Hyponatremia     Chronic. Felt secondary to SIADH   . COPD (chronic obstructive pulmonary disease) (HCC)     on home oxygen  . Tobacco abuse   . Bradycardia   . Carotid artery  disease (HCC)     Hx of endarterectomy. Doppler October, 2011, stable, 0-39% RIC A., 40-59% LICA  . Tuberculosis     Exposures to tuberculosis 1970s, has tested negative by the health Department  . Chronic atrial fibrillation (HCC)     Not felt to be an anticoagulation candidate secondary to hx of GIB/AVM  . Venous stasis of lower extremity     Chronic  . CHF with left ventricular diastolic dysfunction, NYHA class 2 (HCC)     a. EF 55-60%, echo, April, 2013 b. EF 55-60%, mild biatrial enlargement and PASP 42 mmH  . AAA (abdominal aortic aneurysm) (HCC)   . Complication of anesthesia   . Hx of CABG     CABG 2005  . Hematoma     Right groin hematoma, small, post cath, April, 2014  . Mitral regurgitation     Mild, hospital, March, 2014  . Leg ulcer (HCC)     Ulcerated lesion on the anterior aspect of her left lower leg, June, 2014  . Type 2 diabetes mellitus [facility](HCC)    Past Surgical History  Procedure Laterality Date  . Cholecystectomy    . Carotid endartercetomy    . Abdominal hysterectomy    . Coronary artery bypass graft  2005  . Coronary angioplasty with stent placement  07/18/12    severe native coronary artery disease, 99% proximal stenosis of the SVG-D2 status post successful PTCA/PCI  with a Veriflex bare-metal stent, widely patent SVGs to both the right PDA sequential OM1 to OM 2, EF 65-70% and 3+ mitral regurgitation  . Left heart catheterization with coronary angiogram N/A 07/18/2012    Procedure: LEFT HEART CATHETERIZATION WITH CORONARY ANGIOGRAM;  Surgeon: Marykay Lex, MD;  Location: [facility] CATH LAB;  Service: Cardiovascular;  Laterality: N/A;  . Percutaneous coronary stent intervention (pci-s)  07/18/2012    Procedure: PERCUTANEOUS CORONARY STENT INTERVENTION (PCI-S);  Surgeon: Marykay Lex, MD;  Location: [facility] CATH LAB;  Service: Cardiovascular;;  . Ercp N/A 11/20/2014    Procedure: ENDOSCOPIC RETROGRADE CHOLANGIOPANCREATOGRAPHY (ERCP);  Surgeon: Meryl Dare, MD;   Location: [facility] ENDOSCOPY;  Service: Endoscopy;  Laterality: N/A;   Family History  Problem Relation Age of Onset  . Stroke Sister   . Breast cancer Sister   . Breast cancer Brother   . Heart attack Neg Hx   . Hypertension Neg Hx   . Lung cancer Sister   . Lung cancer Father   . Congestive Heart Failure Mother   . Prostate cancer Brother    Social History  Substance Use Topics  . Smoking status: Former Smoker -- 0.50 packs/day for 59 years    Types: Cigarettes    Quit date: 02/01/2013  . Smokeless tobacco: None  . Alcohol Use: No   OB History    No data available     Review of Systems  10 Systems reviewed and are negative for acute change except as noted in the HPI.   Allergies  Other; Codeine; Sulfonamide derivatives; Warfarin; Penicillins; and Ranitidine hcl  Home Medications   Prior to Admission medications   Medication Sig Start Date End Date Taking? Authorizing Provider  albuterol (PROVENTIL HFA;VENTOLIN HFA) 108 (90 BASE) MCG/ACT inhaler Inhale 2 puffs into the lungs every 4 (four) hours as needed for wheezing or shortness of breath. 07/16/14  Yes Tammy S Parrett, NP  albuterol (PROVENTIL) (2.5 MG/3ML) 0.083% nebulizer solution Take 2.5 mg by nebulization every 6 (six) hours as needed for wheezing.   Yes Historical Provider, MD  ALPRAZolam (XANAX) 0.25 MG tablet Take 1 tablet (0.25 mg total) by mouth at bedtime as needed for anxiety. 11/23/14  Yes Leroy Sea, MD  aspirin EC 81 MG tablet Take 81 mg by mouth daily.   Yes Historical Provider, MD  atorvastatin (LIPITOR) 40 MG tablet Take 0.5 tablets (20 mg total) by mouth daily at 6 PM. 11/07/13  Yes Rosalio Macadamia, NP  diazepam (VALIUM) 2 MG tablet Take 2 mg by mouth every 6 (six) hours as needed for anxiety.   Yes Historical Provider, MD  Digoxin 62.5 MCG TABS Take 0.0625 mg by mouth daily. 07/01/15  Yes Lars Masson, MD  diltiazem (DILACOR XR) 120 MG 24 hr capsule Take 1 capsule (120 mg total) by mouth daily.  07/01/15  Yes Lars Masson, MD  feeding supplement (BOOST / RESOURCE BREEZE) LIQD Take 1 Container by mouth 2 (two) times daily as needed (for supplementatioin).    Yes Historical Provider, MD  furosemide (LASIX) 40 MG tablet Take 1 tablet (40 mg total) by mouth daily. 03/28/15  Yes Palma Holter, MD  insulin glargine (LANTUS) 100 UNIT/ML injection Inject 0.1 mLs (10 Units total) into the skin at bedtime. 03/26/15  Yes Palma Holter, MD  levofloxacin (LEVAQUIN) 500 MG tablet Take 500 mg by mouth daily. 10 day regimen, ending 07-18-15 07/08/15  Yes Historical Provider, MD  nitroGLYCERIN (NITROSTAT) 0.4 MG SL  tablet Place 0.4 mg under the tongue every 5 (five) minutes as needed for chest pain.   Yes Historical Provider, MD  OXYGEN Inhale 2-2.5 L into the lungs continuous. 2L continously   Yes Historical Provider, MD  pantoprazole (PROTONIX) 40 MG tablet Take 1 tablet (40 mg total) by mouth daily. 12/26/14  Yes Leone Brand, NP  polyethylene glycol (MIRALAX / GLYCOLAX) packet Take 17 g by mouth daily as needed for mild constipation or moderate constipation. Reported on 07/10/2015   Yes Historical Provider, MD  potassium chloride SA (K-DUR,KLOR-CON) 20 MEQ tablet Take 1 tablet (20 mEq total) by mouth daily. 03/28/15  Yes Palma Holter, MD  predniSONE (DELTASONE) 20 MG tablet Take 20 mg by mouth daily. 07/08/15  Yes Historical Provider, MD  senna (SENOKOT) 8.6 MG TABS tablet Take 8.6 mg by mouth daily as needed for mild constipation or moderate constipation.    Yes Historical Provider, MD  SPIRIVA HANDIHALER 18 MCG inhalation capsule INHALE THE CONTENTS OF 1 CAPSULE ONCE DAILY 06/03/15  Yes Oretha Milch, MD  SYMBICORT 160-4.5 MCG/ACT inhaler USE 2 INHALATIONS TWICE A DAY 11/19/14  Yes Oretha Milch, MD  Amino Acids-Protein Hydrolys (FEEDING SUPPLEMENT, PRO-STAT SUGAR FREE 64,) LIQD Take 30 mLs by mouth daily. Patient not taking: Reported on 07/10/2015 03/26/15   Palma Holter, MD   feeding supplement, ENSURE ENLIVE, (ENSURE ENLIVE) LIQD Take 237 mLs by mouth daily. Patient not taking: Reported on 07/10/2015 03/26/15   Palma Holter, MD  insulin aspart (NOVOLOG) 100 UNIT/ML injection Before each meal 3 times a day, 140-199 - 2 units, 200-250 - 4 units, 251-299 - 6 units,  300-349 - 8 units,  350 or above 10 units. Dispense syringes and needles as needed, Ok to switch to PEN if approved. Substitute to any brand approved. DX DM2, Code E11.65 11/23/14   Leroy Sea, MD  ondansetron (ZOFRAN) 8 MG tablet Take 1 tablet (8 mg total) by mouth every 8 (eight) hours as needed for nausea or vomiting. Patient not taking: Reported on 07/10/2015 10/28/14   Mancel Bale, MD   BP 167/90 mmHg  Pulse 94  Temp(Src) 97.9 F (36.6 C) (Oral)  Resp 23  Ht 6' (1.829 m)  Wt 150 lb (68.04 kg)  BMI 20.34 kg/m2  SpO2 96% Physical Exam  Constitutional: She is oriented to person, place, and time. She appears well-developed and well-nourished.  Patient is nontoxic and alert. Mild increased work of breathing. MS status is clear.  HENT:  Head: Normocephalic and atraumatic.  Eyes: EOM are normal. Pupils are equal, round, and reactive to light.  Neck: Neck supple.  Cardiovascular: Regular rhythm, normal heart sounds and intact distal pulses.   Tachycardia. Irregularly irregular.  Pulmonary/Chest:  Mild increased work of breathing. Patient is speaking in full sentences. Soft breath sounds at the bases. No gross wheeze rhonchi or rail.  Abdominal: Soft. Bowel sounds are normal. She exhibits no distension. There is no tenderness.  Musculoskeletal: Normal range of motion. She exhibits no edema or tenderness.  Neurological: She is alert and oriented to person, place, and time. She has normal strength. Coordination normal. GCS eye subscore is 4. GCS verbal subscore is 5. GCS motor subscore is 6.  Skin: Skin is warm, dry and intact.  Psychiatric: She has a normal mood and affect.    ED Course   Procedures (including critical care time) Labs Review Labs Reviewed  COMPREHENSIVE METABOLIC PANEL - Abnormal; Notable for the following:  Chloride 96 (*)    Glucose, Bld 112 (*)    Creatinine, Ser 1.19 (*)    Total Protein 6.0 (*)    Albumin 3.3 (*)    ALT 11 (*)    GFR calc non Af Amer 41 (*)    GFR calc Af Amer 48 (*)    All other components within normal limits  BRAIN NATRIURETIC PEPTIDE - Abnormal; Notable for the following:    B Natriuretic Peptide 157.2 (*)    All other components within normal limits  TROPONIN I - Abnormal; Notable for the following:    Troponin I 0.04 (*)    All other components within normal limits  CBC WITH DIFFERENTIAL/PLATELET - Abnormal; Notable for the following:    RBC 3.53 (*)    Hemoglobin 9.7 (*)    HCT 31.2 (*)    All other components within normal limits  DIGOXIN LEVEL - Abnormal; Notable for the following:    Digoxin Level 0.4 (*)    All other components within normal limits  I-STAT ARTERIAL BLOOD GAS, ED - Abnormal; Notable for the following:    pH, Arterial 7.462 (*)    Bicarbonate 31.4 (*)    Acid-Base Excess 7.0 (*)    All other components within normal limits  CULTURE, BLOOD (ROUTINE X 2)  CULTURE, BLOOD (ROUTINE X 2)  BLOOD GAS, ARTERIAL  URINALYSIS, ROUTINE W REFLEX MICROSCOPIC (NOT AT [facility])    Imaging Review Dg Chest 2 View  07/10/2015  CLINICAL DATA:  Worsening shortness of breath, COPD, hypertension, diabetes mellitus, CHF, coronary artery disease EXAM: CHEST  2 VIEW COMPARISON:  04/30/2015 FINDINGS: Enlargement of cardiac silhouette post CABG. Pulmonary vascular congestion. Mediastinal contours normal. Atherosclerotic calcifications aorta. Emphysematous and bronchitic changes consistent with COPD. Scattered parenchymal scarring. Increased LEFT lower lobe opacification question atelectasis versus consolidation. Small bibasilar effusions, new. Remaining lungs clear. No pneumothorax. Bones demineralized. IMPRESSION:  Enlargement of cardiac silhouette with pulmonary vascular congestion post CABG. COPD changes with bibasilar effusions and LEFT lower lobe atelectasis versus consolidation. Electronically Signed   By: Ulyses Southward M.D.   On: 07/10/2015 09:35   I have personally reviewed and evaluated these images and lab results as part of my medical decision-making.   EKG Interpretation   Date/Time:  Wednesday July 10 2015 09:10:25 EDT Ventricular Rate:  99 PR Interval:  131 QRS Duration: 114 QT Interval:  354 QTC Calculation: 454 R Axis:   -44 Text Interpretation:  Unknown rhythm, irregular rate Abnormal R-wave  progression, early transition LVH with secondary repolarization  abnormality afib. no stemi. no sig change from old Confirmed by Donnald Garre,  MD, Lebron Conners [phone]) on 07/10/2015 9:34:42 AM     11:00 patient while in appearance. No respiratory distress. Heart rate in the 90s. Patient has completed a DuoNeb. Reports feeling much better. MDM   Final diagnoses:  Chronic obstructive pulmonary disease with acute lower respiratory infection (HCC)  Anxiety  Chronic atrial fibrillation (HCC)   Patient has multiple chronic underlying conditions of COPD, chronic H of fibrillation and anxiety. She has recent diagnosis of pneumonia on 3 days of Levaquin and prednisone. At this time, her symptoms have improved and she is not in acute respiratory distress. Diagnostic studies do not suggest worsening or advancing pneumonia. Symptoms are consistent with COPD exacerbation with a previously diagnosed underlying pneumonia. I suspect symptoms are more a combination of COPD and anxiety then worsening of acute pulmonary infection. Patient will be instructed to complete her Levaquin and prednisone as prescribed  by her provider. She is also advised to use her inhaler or nebulizer every 4 hours. Patient was uncertain about whether or not she was taking digoxin. Her level is low. Most recent cardiology visit this month suggest  the patient was to be continuing the digoxin. Patient may need assistance with her medication administration. Care management will be consult at to make sure she has adequate understanding or assistance with medication administration Instructions are to follow-up this week with her pulmonologist.    Arby Barrette, MD 07/10/15 [phone]

## 2015-07-15 LAB — CULTURE, BLOOD (ROUTINE X 2)
Culture: NO GROWTH
Culture: NO GROWTH

## 2015-09-09 ENCOUNTER — Encounter: Payer: Self-pay | Admitting: Pulmonary Disease

## 2015-09-09 ENCOUNTER — Ambulatory Visit (INDEPENDENT_AMBULATORY_CARE_PROVIDER_SITE_OTHER): Payer: Medicare Other | Admitting: Pulmonary Disease

## 2015-09-09 VITALS — BP 112/62 | HR 85 | Ht 70.0 in | Wt 153.8 lb

## 2015-09-09 DIAGNOSIS — J449 Chronic obstructive pulmonary disease, unspecified: Secondary | ICD-10-CM

## 2015-09-09 DIAGNOSIS — L609 Nail disorder, unspecified: Secondary | ICD-10-CM | POA: Diagnosis not present

## 2015-09-09 DIAGNOSIS — Z66 Do not resuscitate: Secondary | ICD-10-CM | POA: Diagnosis not present

## 2015-09-09 DIAGNOSIS — J9611 Chronic respiratory failure with hypoxia: Secondary | ICD-10-CM

## 2015-09-09 NOTE — Patient Instructions (Signed)
Referral to podiatry Stay on Symbicort and Spiriva We discussed and confirmed DO NOT RESUSCITATE order

## 2015-09-09 NOTE — Assessment & Plan Note (Addendum)
We had an extended discussion about advanced directives of life support. We discussed CPR and mechanical ventilation. Her son Loraine LericheMark was present DO NOT RESUSCITATE form was signed and a copy placed in the chart She would be receptive to all forms of medical care

## 2015-09-09 NOTE — Progress Notes (Signed)
Subjective:    Patient ID: [name], female    DOB: 09-10-1929, 82 y.o.   MRN: [id number]  HPI  86/F , smoker presents for FU of gold D COPD, 2-3 admits/ yr.  She has been on home O2 since dec 2010 ([employer]). She smoked 1/2 PPD, about 56 Pyrs - chantix made her sick & she is afraid of cardiac side effects with patches. She sees [name] for CAD & has tolerated lisinopril & metoprolol, echo 7/10 showed nml LV fn & non dilated RV. An episode of syncope in 2009 was attributed to NTG & diuretics.    09/09/2015  Chief Complaint  Patient presents with  . Follow-up    breathing not doing well.  chest tightness, but no pain.  discuss DNR.  CAT: 33     Accompanied by granddaughter [name] who is her daytime caregiver And son [name] Last hospital visit was in 04/2015 for UTI followed by extended stay at  nursing home for 3 weeks She does not do much and keeps lying in bed, she has numerous abrasions over her skin. Her nails are getting too long and [name] is unable to trim these  We had a detailed discussion about life support  On Spiriva and Symbicort  She is now off prednisone She has not had a flare of cough, wheezing or dyspnea with lowering the prednisone.  She denies hemoptysis.   Significant tests/ events  PFTs 08/19/09 >> severe airway obstruction, FEV1 47%, no BD response, air trapping +, severe decrease in diffusion  CT angio 5/12 asc aortic aneurysm 41mm, hepatic cysts  Hosp 06/2011 for New paroxysmal atrial fibrillation with RVR and COPD exacerbation  - not felt to be a coumadin candidate secondary to hx of GIB/AVMs   Hosp adm 07/2012 for stent, 09/2012 for LLE cellulitis & bleeding, ABI (demonstarted moderate Left lower extremity PAD)     Past Medical History  Diagnosis Date  . CAD (coronary artery disease)     a. s/p CABG x5 (2005) LIMA->LAD, rSVG->diag, seq r SVG -> 1st OM and distal LCX, rSVG->PDA b. s/p BMS-prox SVG-D2 (2014)  . Hypertension   . Hyperlipidemia    . GI bleed 2008    AVMs  . Aneurysm, aortic (HCC)     thoracic aorta, stable at 4.1 cm, chest CT, May, 2012  . Syncope     Nitroglycerin plus a diuretic, April, 2009  . Hyponatremia     Chronic. Felt secondary to SIADH   . COPD (chronic obstructive pulmonary disease) (HCC)     on home oxygen  . Tobacco abuse   . Bradycardia   . Carotid artery disease (HCC)     Hx of endarterectomy. Doppler October, 2011, stable, 0-39% RIC A., 40-59% LICA  . Tuberculosis     Exposures to tuberculosis 1970s, has tested negative by the health Department  . Chronic atrial fibrillation (HCC)     Not felt to be an anticoagulation candidate secondary to hx of GIB/AVM  . Venous stasis of lower extremity     Chronic  . CHF with left ventricular diastolic dysfunction, NYHA class 2 (HCC)     a. EF 55-60%, echo, April, 2013 b. EF 55-60%, mild biatrial enlargement and PASP 42 mmH  . AAA (abdominal aortic aneurysm) (HCC)   . Complication of anesthesia   . Hx of CABG     CABG 2005  . Hematoma     Right groin hematoma, small, post cath, April, 2014  .  Mitral regurgitation     Mild, hospital, March, 2014  . Leg ulcer (HCC)     Ulcerated lesion on the anterior aspect of her left lower leg, June, 2014  . Type 2 diabetes mellitus (HCC)     Review of Systems Patient denies significant dyspnea,cough, hemoptysis,  chest pain, palpitations, pedal edema, orthopnea, paroxysmal nocturnal dyspnea, lightheadedness, nausea, vomiting, abdominal or  leg pains      Objective:   Physical Exam  Gen. Pleasant, well-nourished, in no distress ENT - no lesions, no post nasal drip Neck: No JVD, no thyromegaly, no carotid bruits Lungs: no use of accessory muscles, no dullness to percussion, Decreased breath sounds bilateral without rales or rhonchi  Cardiovascular: Rhythm regular, heart sounds  normal, no murmurs or gallops, no peripheral edema Musculoskeletal: No deformities, no cyanosis or clubbing  Skin-friable with  numerous abrasions       Assessment & Plan:

## 2015-09-09 NOTE — Assessment & Plan Note (Signed)
Continue Symbicort and Spiriva 

## 2015-09-20 ENCOUNTER — Ambulatory Visit: Payer: Medicare Other | Admitting: Podiatry

## 2015-10-16 ENCOUNTER — Encounter: Payer: Self-pay | Admitting: Cardiology

## 2015-10-16 ENCOUNTER — Ambulatory Visit (INDEPENDENT_AMBULATORY_CARE_PROVIDER_SITE_OTHER): Payer: Medicare Other | Admitting: Cardiology

## 2015-10-16 VITALS — BP 124/56 | HR 64 | Ht 70.0 in | Wt 143.0 lb

## 2015-10-16 DIAGNOSIS — I5032 Chronic diastolic (congestive) heart failure: Secondary | ICD-10-CM

## 2015-10-16 DIAGNOSIS — E785 Hyperlipidemia, unspecified: Secondary | ICD-10-CM

## 2015-10-16 DIAGNOSIS — I25119 Atherosclerotic heart disease of native coronary artery with unspecified angina pectoris: Secondary | ICD-10-CM

## 2015-10-16 DIAGNOSIS — I4891 Unspecified atrial fibrillation: Secondary | ICD-10-CM

## 2015-10-16 DIAGNOSIS — I10 Essential (primary) hypertension: Secondary | ICD-10-CM

## 2015-10-16 DIAGNOSIS — I739 Peripheral vascular disease, unspecified: Secondary | ICD-10-CM

## 2015-10-16 DIAGNOSIS — J441 Chronic obstructive pulmonary disease with (acute) exacerbation: Secondary | ICD-10-CM

## 2015-10-16 MED ORDER — FUROSEMIDE 40 MG PO TABS: 40.0000 mg | ORAL_TABLET | Freq: Every day | ORAL | Status: AC | PRN

## 2015-10-16 MED ORDER — DIGOXIN 62.5 MCG PO TABS: 0.0625 mg | ORAL_TABLET | Freq: Every day | ORAL | Status: DC

## 2015-10-16 MED ORDER — DILTIAZEM HCL ER 120 MG PO CP24: 120.0000 mg | ORAL_CAPSULE | Freq: Every day | ORAL | Status: AC

## 2015-10-16 NOTE — Patient Instructions (Signed)
Medication Instructions:   TAKE LASIX 40 MG ONCE DAILY ONLY AS NEEDED FOR LOWER EXTREMITY EDEMA OR FLUID RETENTION    Labwork:  PRIOR TOO YOUR 6 MONTH FOLLOW-UP APPOINTMENT WITH DR NELSON TO CHECK--CMET, CBC W DIFF, LIPIDS, AND DIGOXIN LEVEL---PLEASE COME FASTING TO THIS LAB APPOINTMENT    Follow-Up:  Your physician wants you to follow-up in: 6 MONTHS WITH DR Johnell ComingsNELSON You will receive a reminder letter in the mail two months in advance. If you don't receive a letter, please call our office to schedule the follow-up appointment.  PLEASE HAVE YOUR LABS DONE PRIOR TO THIS FOLLOW-UP APPOINTMENT, AS MENTIONED ABOVE       If you need a refill on your cardiac medications before your next appointment, please call your pharmacy.

## 2015-10-16 NOTE — Progress Notes (Signed)
Patient ID: [name], female   DOB: [birth year], 82 y.o.   MRN: [id number]      Cardiology Office Note  Date:  10/16/2015   ID:  [name], DOB Dec 01, 1929, MRN [id number]  PCP:  Dois Davenport., MD  Cardiologist: Dr. Delton See    Chief complain: Chronic shortness of breath.   History of Present Illness: [name] is a 82 y.o. female who presents for post hospital for increasing confusion, probably from UTI. Vs hypoxia.   She has hx in 11/2014 of sepsis,AMS, severe-copd, elevated LFTs and rapid VR of her chronic a fib. Pt recovered and has been in SNF. Now at home her granddaughter stays with her and family members stay on the property. With hypotension in hospital in August her cardizem was changed to 120 every 8 hours. She is not anticoagulation candidate due to GI bleed in the past. Cardiac hx with a fib, chronic , CAD with CABG in 2005 with LIMA->LAD, rSVG->diag, seq r SVG -> 1st OM and distal LCX, rSVG->PDA. Last cath 06/2012:  Severe native coronary disease as described.  Severe proximal stenosis of the SVG-D2: Subtotal, 99% occlusion  Successful PTCA/PCI of the possible SVG-D2 with a Veriflex BMS 3.0 mm x 20 mm, postdilated to 3.4 mm  Widely patent SVGs to both the RPDA and sequential to OM1-OM 2  Well-preserved EF by LV gram however there is evidence of significant MR.  Last echo 10/2014  Study Conclusions - Left ventricle: The cavity size was normal. Wall thickness was increased in a pattern of mild LVH. Systolic function was normal. The estimated ejection fraction was in the range of 55% to 60%. Regional wall motion abnormalities cannot be excluded. - Mitral valve: There was mild regurgitation. - Left atrium: The atrium was moderately dilated. - Right atrium: The atrium was mildly dilated. - Pulmonary arteries: PA peak pressure: 35 mm Hg (S).  She was hospitalized on 03/20/2015 with a COPD exacerbation. Cards consulted on 03/19/2015 for atrial  fibrillation w/ RVR. She continued to be tachycardic she saw the hospitalization and her Cardizem CD was increased to 240 mg daily On last visit with Dr. Delton See 04/01/15 her HR was elevated up to 140.  10/16/2015 - this is a 3 months follow-up the patient continues to use 2 L of oxygen 24 7 for her advanced COPD. She otherwise denies chest pain, her shortness of breath has been stable, she is no lower extremity edema no orthopnea no paroxysmal nocturnal dyspnea no palpitations or syncope. She complains of very dry skin and significant itching. She is using very low-dose of digoxin and her heart rate has been controlled, she is only on aspirin and she has history of GI bleed.  Past Medical History  Diagnosis Date  . CAD (coronary artery disease)     a. s/p CABG x5 (2005) LIMA->LAD, rSVG->diag, seq r SVG -> 1st OM and distal LCX, rSVG->PDA b. s/p BMS-prox SVG-D2 (2014)  . Hypertension   . Hyperlipidemia   . GI bleed 2008    AVMs  . Aneurysm, aortic (HCC)     thoracic aorta, stable at 4.1 cm, chest CT, May, 2012  . Syncope     Nitroglycerin plus a diuretic, April, 2009  . Hyponatremia     Chronic. Felt secondary to SIADH   . COPD (chronic obstructive pulmonary disease) (HCC)     on home oxygen  . Tobacco abuse   . Bradycardia   . Carotid artery disease (HCC)  Hx of endarterectomy. Doppler October, 2011, stable, 0-39% RIC A., 40-59% LICA  . Tuberculosis     Exposures to tuberculosis 1970s, has tested negative by the health Department  . Chronic atrial fibrillation (HCC)     Not felt to be an anticoagulation candidate secondary to hx of GIB/AVM  . Venous stasis of lower extremity     Chronic  . CHF with left ventricular diastolic dysfunction, NYHA class 2 (HCC)     a. EF 55-60%, echo, April, 2013 b. EF 55-60%, mild biatrial enlargement and PASP 42 mmH  . AAA (abdominal aortic aneurysm) (HCC)   . Complication of anesthesia   . Hx of CABG     CABG 2005  . Hematoma     Right groin  hematoma, small, post cath, April, 2014  . Mitral regurgitation     Mild, hospital, March, 2014  . Leg ulcer (HCC)     Ulcerated lesion on the anterior aspect of her left lower leg, June, 2014  . Type 2 diabetes mellitus [facility](HCC)    Past Surgical History  Procedure Laterality Date  . Cholecystectomy    . Carotid endartercetomy    . Abdominal hysterectomy    . Coronary artery bypass graft  2005  . Coronary angioplasty with stent placement  07/18/12    severe native coronary artery disease, 99% proximal stenosis of the SVG-D2 status post successful PTCA/PCI with a Veriflex bare-metal stent, widely patent SVGs to both the right PDA sequential OM1 to OM 2, EF 65-70% and 3+ mitral regurgitation  . Left heart catheterization with coronary angiogram N/A 07/18/2012    Procedure: LEFT HEART CATHETERIZATION WITH CORONARY ANGIOGRAM;  Surgeon: Marykay Lexavid W Harding, MD;  [facility];  Service: Cardiovascular;  Laterality: N/A;  . Percutaneous coronary stent intervention (pci-s)  07/18/2012    Procedure: PERCUTANEOUS CORONARY STENT INTERVENTION (PCI-S);  Surgeon: Marykay Lexavid W Harding, MD;  [facility];  Service: Cardiovascular;;  . Ercp N/A 11/20/2014    Procedure: ENDOSCOPIC RETROGRADE CHOLANGIOPANCREATOGRAPHY (ERCP);  Surgeon: Meryl DareMalcolm T Stark, MD;  [facility] ENDOSCOPY;  Service: Endoscopy;  Laterality: N/A;   Current Outpatient Prescriptions  Medication Sig Dispense Refill  . albuterol (PROVENTIL HFA;VENTOLIN HFA) 108 (90 BASE) MCG/ACT inhaler Inhale 2 puffs into the lungs every 4 (four) hours as needed for wheezing or shortness of breath. 1 Inhaler 5  . albuterol (PROVENTIL) (2.5 MG/3ML) 0.083% nebulizer solution Take 2.5 mg by nebulization every 6 (six) hours as needed for wheezing.    Marland Kitchen. ALPRAZolam (XANAX) 0.25 MG tablet Take 1 tablet (0.25 mg total) by mouth at bedtime as needed for anxiety. 10 tablet 0  . Amino Acids-Protein Hydrolys (FEEDING SUPPLEMENT, PRO-STAT SUGAR FREE 64,) LIQD Take 30  mLs by mouth daily. 900 mL 0  . aspirin EC 81 MG tablet Take 81 mg by mouth daily.    Marland Kitchen. atorvastatin (LIPITOR) 40 MG tablet Take 0.5 tablets (20 mg total) by mouth daily at 6 PM. 90 tablet 1  . diazepam (VALIUM) 2 MG tablet Take 2 mg by mouth every 6 (six) hours as needed for anxiety.    . Digoxin 62.5 MCG TABS Take 0.0625 mg by mouth daily. 90 tablet 3  . diltiazem (DILACOR XR) 120 MG 24 hr capsule Take 1 capsule (120 mg total) by mouth daily. 90 capsule 3  . feeding supplement (BOOST / RESOURCE BREEZE) LIQD Take 1 Container by mouth 2 (two) times daily as needed (for supplementatioin).     . feeding supplement, ENSURE  ENLIVE, (ENSURE ENLIVE) LIQD Take 237 mLs by mouth daily. 237 mL 12  . furosemide (LASIX) 40 MG tablet Take 1 tablet (40 mg total) by mouth daily. 90 tablet 0  . insulin aspart (NOVOLOG) 100 UNIT/ML injection Before each meal 3 times a day, 140-199 - 2 units, 200-250 - 4 units, 251-299 - 6 units,  300-349 - 8 units,  350 or above 10 units. Dispense syringes and needles as needed, Ok to switch to PEN if approved. Substitute to any brand approved. DX DM2, Code E11.65 1 vial 12  . insulin glargine (LANTUS) 100 UNIT/ML injection Inject 0.1 mLs (10 Units total) into the skin at bedtime. 10 mL 11  . nitroGLYCERIN (NITROSTAT) 0.4 MG SL tablet Place 0.4 mg under the tongue every 5 (five) minutes as needed for chest pain.    Marland Kitchen ondansetron (ZOFRAN) 8 MG tablet Take 1 tablet (8 mg total) by mouth every 8 (eight) hours as needed for nausea or vomiting. 20 tablet 0  . OXYGEN Inhale 2-2.5 L into the lungs continuous. 2L continously    . pantoprazole (PROTONIX) 40 MG tablet Take 1 tablet (40 mg total) by mouth daily. 90 tablet 3  . polyethylene glycol (MIRALAX / GLYCOLAX) packet Take 17 g by mouth daily as needed for mild constipation or moderate constipation. Reported on 07/10/2015    . potassium chloride SA (K-DUR,KLOR-CON) 20 MEQ tablet Take 1 tablet (20 mEq total) by mouth daily. 30 tablet 0  .  senna (SENOKOT) 8.6 MG TABS tablet Take 8.6 mg by mouth daily as needed for mild constipation or moderate constipation.     Marland Kitchen SPIRIVA HANDIHALER 18 MCG inhalation capsule INHALE THE CONTENTS OF 1 CAPSULE ONCE DAILY 90 capsule 1  . SYMBICORT 160-4.5 MCG/ACT inhaler USE 2 INHALATIONS TWICE A DAY 30.6 g 0   No current facility-administered medications for this visit.   Allergies:   Other; Codeine; Sulfonamide derivatives; Warfarin; Penicillins; and Ranitidine hcl   Social History:  The patient  reports that she quit smoking about 2 years ago. Her smoking use included Cigarettes. She has a 29.5 pack-year smoking history. She does not have any smokeless tobacco history on file. She reports that she does not drink alcohol or use illicit drugs.  Family History:  The patient's family history includes Breast cancer in her brother and sister; Congestive Heart Failure in her mother; Lung cancer in her father and sister; Prostate cancer in her brother; Stroke in her sister. There is no history of Heart attack or Hypertension.   ROS:  General:no colds or fevers- recent UTI and COPD exacerbation, no weight changes Skin:no rashes or ulcers HEENT:no blurred vision, no congestion CV:see HPI PUL:see HPI GI:no diarrhea constipation or melena, no indigestion GU:no hematuria, no dysuria MS:no joint pain, no claudication Neuro:no syncope, no lightheadedness Endo:+ diabetes, no thyroid disease Wt Readings from Last 3 Encounters:  10/16/15 143 lb (64.864 kg)  09/09/15 153 lb 12.8 oz (69.763 kg)  07/10/15 150 lb (68.04 kg)    PHYSICAL EXAM: VS:  BP 124/56 mmHg  Pulse 64  Ht 5\' 10"  (1.778 m)  Wt 143 lb (64.864 kg)  BMI 20.52 kg/m2  SpO2 92% , BMI Body mass index is 20.52 kg/(m^2). General:Pleasant affect, NAD- not very interactive Skin:Warm and dry, brisk capillary refill HEENT:normocephalic, sclera clear, mucus membranes moist Neck:supple, no JVD, no bruits  Heart:irreg irreg without murmur, gallup, rub  or click Lungs:diminished without rales, rhonchi, or wheezes ZOX:WRUE, non tender, + BS, do not palpate liver  spleen or masses Ext:no lower ext edema, 2+ pedal pulses, 2+ radial pulses Neuro:alert and oriented, MAE, follows commands, + facial symmetry  EKG:  EKG is ordered today. The ekg ordered today demonstrates a fib rate controlled with LAD, LVH, ST abnormality.  Recent Labs: 11/18/2014: TSH 0.702 05/05/2015: Magnesium 2.5* 07/10/2015: ALT 11*; B Natriuretic Peptide 157.2*; BUN 11; Creatinine, Ser 1.19*; Hemoglobin 9.7*; Platelets 222; Potassium 3.6; Sodium 138   Lipid Panel    Component Value Date/Time   CHOL 134 04/17/2015 0946   TRIG 127 04/17/2015 0946   HDL 56 04/17/2015 0946   CHOLHDL 2.4 04/17/2015 0946   VLDL 25 04/17/2015 0946   LDLCALC 53 04/17/2015 0946    ASSESSMENT AND PLAN:  1.  Permanent atrial fib rate controlled.  Dig level 1.6 in January 2017, digoxin was increased to  0.625 daily, her rate is well controlled.  No anticoagulation due to GI bleed.  She is currently rate control and I would continue the same management.  2.  Chronic diastolic CHF - she appears dry, I would discontinue her Lasix 40 mg daily and started only as when necessary she is explained daily weights control and using Lasix as needed.   3.  HTN - controlled, continue the same management.  4. Severe COPD - severe, followed by Dr. Vassie Loll 06/12/15, resting SpO2 in 80'.  Follow up in 6 months. We will check CMP, CBC, lipids and the Jean Rosenthal level prior to the next appointment.  Signed, Tobias Alexander, MD  10/16/2015 12:36 PM    [facility] [address] [place], [place], [place]  [address]/ [address] [place], [place] Phone: [phone]; Fax: [phone]  [phone]

## 2015-10-22 ENCOUNTER — Other Ambulatory Visit: Payer: Self-pay | Admitting: *Deleted

## 2015-10-22 DIAGNOSIS — I739 Peripheral vascular disease, unspecified: Secondary | ICD-10-CM

## 2015-10-22 DIAGNOSIS — E785 Hyperlipidemia, unspecified: Secondary | ICD-10-CM

## 2015-10-22 DIAGNOSIS — J441 Chronic obstructive pulmonary disease with (acute) exacerbation: Secondary | ICD-10-CM

## 2015-10-22 DIAGNOSIS — I5032 Chronic diastolic (congestive) heart failure: Secondary | ICD-10-CM

## 2015-10-22 DIAGNOSIS — I4891 Unspecified atrial fibrillation: Secondary | ICD-10-CM

## 2015-10-22 DIAGNOSIS — I10 Essential (primary) hypertension: Secondary | ICD-10-CM

## 2015-10-22 DIAGNOSIS — I25119 Atherosclerotic heart disease of native coronary artery with unspecified angina pectoris: Secondary | ICD-10-CM

## 2015-10-22 MED ORDER — DIGOXIN 62.5 MCG PO TABS: 0.0625 mg | ORAL_TABLET | Freq: Every day | ORAL | Status: DC

## 2015-10-22 NOTE — Telephone Encounter (Signed)
Patient called about medication refill costs and switching pharmacies.

## 2015-12-12 ENCOUNTER — Encounter: Payer: Self-pay | Admitting: Adult Health

## 2015-12-12 ENCOUNTER — Ambulatory Visit (INDEPENDENT_AMBULATORY_CARE_PROVIDER_SITE_OTHER): Payer: Medicare Other | Admitting: Adult Health

## 2015-12-12 DIAGNOSIS — J441 Chronic obstructive pulmonary disease with (acute) exacerbation: Secondary | ICD-10-CM

## 2015-12-12 DIAGNOSIS — J9611 Chronic respiratory failure with hypoxia: Secondary | ICD-10-CM

## 2015-12-12 MED ORDER — PREDNISONE 10 MG PO TABS: ORAL_TABLET | ORAL | 0 refills | Status: DC

## 2015-12-12 NOTE — Progress Notes (Signed)
Subjective:    Patient ID: [name], female    DOB: Nov 05, 1929, 82 y.o.   MRN: [id number]  HPI  85/F , smoker presents for FU of gold D COPD, 2-3 admits/ yr.  She has been on home O2 since dec 2010 ([facility]). She smoked 1/2 PPD, about 6340 Pyrs - chantix made her sick & she is afraid of cardiac side effects with patches. She sees Dr Myrtis SerKatz for CAD & has tolerated lisinopril & metoprolol, echo 7/10 showed nml LV fn & non dilated RV. An episode of syncope in 2009 was attributed to NTG & diuretics.   Significant tests/ events  PFTs 08/19/09 >> severe airway obstruction, FEV1 47%, no BD response, air trapping +, severe decrease in diffusion  CT angio 5/12 asc aortic aneurysm 41mm, hepatic cysts  Hosp 06/2011 for New paroxysmal atrial fibrillation with RVR and COPD exacerbation  - not felt to be a coumadin candidate secondary to hx of GIB/AVMs   Hosp adm 07/2012 for stent, 09/2012 for LLE cellulitis & bleeding, ABI (demonstarted moderate Left lower extremity PAD)  LE dopplers (no DVT)      12/12/2015 Follow up : COPD/Resp Failure on O2  Pt returns for 3  month follow up .  Says her breathing is not as good last few days. Very humid and hot outside.  Gets winded with minimal activity.  Family says she is not very active. Most days stays in bed most of the time.  When she goes out it wears her out.  Get winded easily. On O2 2 l/m 24/7 .  No chest pain, orthopnea, edema or fever, hemoptysis, discolored mucus.  On Spiriva and Symbicort  Flu  And Prevnar utd.   Recently dx with psorasis . Using a cream.    Past Medical History:  Diagnosis Date  . AAA (abdominal aortic aneurysm) (HCC)   . Aneurysm, aortic (HCC)    thoracic aorta, stable at 4.1 cm, chest CT, May, 2012  . Bradycardia   . CAD (coronary artery disease)    a. s/p CABG x5 (2005) LIMA->LAD, rSVG->diag, seq r SVG -> 1st OM and distal LCX, rSVG->PDA b. s/p BMS-prox SVG-D2 (2014)  . Carotid artery disease (HCC)    Hx of  endarterectomy. Doppler October, 2011, stable, 0-39% RIC A., 40-59% LICA  . CHF with left ventricular diastolic dysfunction, NYHA class 2 (HCC)    a. EF 55-60%, echo, April, 2013 b. EF 55-60%, mild biatrial enlargement and PASP 42 mmH  . Chronic atrial fibrillation (HCC)    Not felt to be an anticoagulation candidate secondary to hx of GIB/AVM  . Complication of anesthesia   . COPD (chronic obstructive pulmonary disease) (HCC)    on home oxygen  . GI bleed 2008   AVMs  . Hematoma    Right groin hematoma, small, post cath, April, 2014  . Hx of CABG    CABG 2005  . Hyperlipidemia   . Hypertension   . Hyponatremia    Chronic. Felt secondary to SIADH   . Leg ulcer (HCC)    Ulcerated lesion on the anterior aspect of her left lower leg, June, 2014  . Mitral regurgitation    Mild, hospital, March, 2014  . Syncope    Nitroglycerin plus a diuretic, April, 2009  . Tobacco abuse   . Tuberculosis    Exposures to tuberculosis 1970s, has tested negative by the health Department  . Type 2 diabetes mellitus (HCC)   . Venous stasis of lower  extremity    Chronic   Current Outpatient Prescriptions on File Prior to Visit  Medication Sig Dispense Refill  . albuterol (PROVENTIL HFA;VENTOLIN HFA) 108 (90 BASE) MCG/ACT inhaler Inhale 2 puffs into the lungs every 4 (four) hours as needed for wheezing or shortness of breath. 1 Inhaler 5  . albuterol (PROVENTIL) (2.5 MG/3ML) 0.083% nebulizer solution Take 2.5 mg by nebulization every 6 (six) hours as needed for wheezing.    Marland Kitchen ALPRAZolam (XANAX) 0.25 MG tablet Take 1 tablet (0.25 mg total) by mouth at bedtime as needed for anxiety. 10 tablet 0  . Amino Acids-Protein Hydrolys (FEEDING SUPPLEMENT, PRO-STAT SUGAR FREE 64,) LIQD Take 30 mLs by mouth daily. 900 mL 0  . aspirin EC 81 MG tablet Take 81 mg by mouth daily.    Marland Kitchen atorvastatin (LIPITOR) 40 MG tablet Take 0.5 tablets (20 mg total) by mouth daily at 6 PM. 90 tablet 1  . diazepam (VALIUM) 2 MG tablet  Take 2 mg by mouth every 6 (six) hours as needed for anxiety.    . Digoxin 62.5 MCG TABS Take 0.0625 mg by mouth daily. 90 tablet 2  . diltiazem (DILACOR XR) 120 MG 24 hr capsule Take 1 capsule (120 mg total) by mouth daily. 90 capsule 6  . feeding supplement (BOOST / RESOURCE BREEZE) LIQD Take 1 Container by mouth 2 (two) times daily as needed (for supplementatioin).     . feeding supplement, ENSURE ENLIVE, (ENSURE ENLIVE) LIQD Take 237 mLs by mouth daily. 237 mL 12  . furosemide (LASIX) 40 MG tablet Take 1 tablet (40 mg total) by mouth daily as needed for fluid or edema. 90 tablet 6  . insulin aspart (NOVOLOG) 100 UNIT/ML injection Before each meal 3 times a day, 140-199 - 2 units, 200-250 - 4 units, 251-299 - 6 units,  300-349 - 8 units,  350 or above 10 units. Dispense syringes and needles as needed, Ok to switch to PEN if approved. Substitute to any brand approved. DX DM2, Code E11.65 1 vial 12  . insulin glargine (LANTUS) 100 UNIT/ML injection Inject 0.1 mLs (10 Units total) into the skin at bedtime. 10 mL 11  . nitroGLYCERIN (NITROSTAT) 0.4 MG SL tablet Place 0.4 mg under the tongue every 5 (five) minutes as needed for chest pain.    Marland Kitchen ondansetron (ZOFRAN) 8 MG tablet Take 1 tablet (8 mg total) by mouth every 8 (eight) hours as needed for nausea or vomiting. 20 tablet 0  . OXYGEN Inhale 2-2.5 L into the lungs continuous. 2L continously    . pantoprazole (PROTONIX) 40 MG tablet Take 1 tablet (40 mg total) by mouth daily. 90 tablet 3  . polyethylene glycol (MIRALAX / GLYCOLAX) packet Take 17 g by mouth daily as needed for mild constipation or moderate constipation. Reported on 07/10/2015    . potassium chloride SA (K-DUR,KLOR-CON) 20 MEQ tablet Take 1 tablet (20 mEq total) by mouth daily. 30 tablet 0  . senna (SENOKOT) 8.6 MG TABS tablet Take 8.6 mg by mouth daily as needed for mild constipation or moderate constipation.     Marland Kitchen SPIRIVA HANDIHALER 18 MCG inhalation capsule INHALE THE CONTENTS OF 1  CAPSULE ONCE DAILY 90 capsule 1  . SYMBICORT 160-4.5 MCG/ACT inhaler USE 2 INHALATIONS TWICE A DAY 30.6 g 0   No current facility-administered medications on file prior to visit.      Review of Systems neg for any significant sore throat, dysphagia, itching, sneezing, nasal congestion or excess/ purulent secretions, fever,  chills, sweats, unintended wt loss, pleuritic or exertional cp, hempoptysis, orthopnea pnd or change in chronic leg swelling. Also denies presyncope, palpitations, heartburn, abdominal pain, nausea, vomiting, diarrhea or change in bowel or urinary habits, dysuria,hematuria, rash, arthralgias, visual complaints, headache, numbness weakness or ataxia.      Objective:   Physical Exam Vitals:   12/12/15 1407  BP: 128/74  Pulse: 91  Temp: 97.8 F (36.6 C)  TempSrc: Oral  SpO2: 94%  Weight: 163 lb (73.9 kg)  Height: 6' (1.829 m)    Gen. Pleasant, chronically ill,in no distress, in wc on o2  VS reviewed  ENT - no lesions, no post nasal drip Neck: No JVD, no thyromegaly, no carotid bruits Lungs: no use of accessory muscles, no dullness to percussion, decreased without rales or rhonchi  Cardiovascular: Rhythm regular, heart sounds  normal, no murmurs or gallops, no peripheral edema Musculoskeletal: No deformities, no cyanosis or clubbing  Skin: thin skin with scattered ecchymosis / skin tears.    Darianny Momon NP-C  [facility]  12/12/2015

## 2015-12-12 NOTE — Patient Instructions (Addendum)
Prednisone taper over next week.  Mucinex DM Twice daily  As needed  Cough/congestion .  Continue on Symbicort and Spiriva .  Continue on Oxygen 2l/m  Follow up Dr. Vassie LollAlva  In 4-6  months and As needed   Please contact office for sooner follow up if symptoms do not improve or worsen or seek emergency care

## 2015-12-12 NOTE — Assessment & Plan Note (Signed)
Exacerbation   Patient Instructions  Prednisone taper over next week.  Mucinex DM Twice daily  As needed  Cough/congestion .  Continue on Symbicort and Spiriva .  Continue on Oxygen 2l/m  Follow up Dr. Vassie LollAlva  In 4-6  months and As needed   Please contact office for sooner follow up if symptoms do not improve or worsen or seek emergency care

## 2015-12-12 NOTE — Assessment & Plan Note (Signed)
Cont on O2 .  

## 2015-12-16 ENCOUNTER — Inpatient Hospital Stay (HOSPITAL_COMMUNITY): Payer: Medicare Other

## 2015-12-16 ENCOUNTER — Emergency Department (HOSPITAL_COMMUNITY): Payer: Medicare Other

## 2015-12-16 ENCOUNTER — Inpatient Hospital Stay (HOSPITAL_COMMUNITY)
Admission: EM | Admit: 2015-12-16 | Discharge: 2015-12-21 | DRG: 481 | Disposition: A | Discharge status: Skilled Nursing Facility | Payer: Medicare Other | Attending: Internal Medicine | Admitting: Internal Medicine

## 2015-12-16 ENCOUNTER — Encounter (HOSPITAL_COMMUNITY): Payer: Self-pay | Admitting: Emergency Medicine

## 2015-12-16 DIAGNOSIS — I480 Paroxysmal atrial fibrillation: Secondary | ICD-10-CM | POA: Diagnosis present

## 2015-12-16 DIAGNOSIS — Z7952 Long term (current) use of systemic steroids: Secondary | ICD-10-CM

## 2015-12-16 DIAGNOSIS — E785 Hyperlipidemia, unspecified: Secondary | ICD-10-CM | POA: Diagnosis present

## 2015-12-16 DIAGNOSIS — S72141A Displaced intertrochanteric fracture of right femur, initial encounter for closed fracture: Principal | ICD-10-CM | POA: Diagnosis present

## 2015-12-16 DIAGNOSIS — Z7982 Long term (current) use of aspirin: Secondary | ICD-10-CM

## 2015-12-16 DIAGNOSIS — Y92000 Kitchen of unspecified non-institutional (private) residence as  the place of occurrence of the external cause: Secondary | ICD-10-CM | POA: Diagnosis not present

## 2015-12-16 DIAGNOSIS — I110 Hypertensive heart disease with heart failure: Secondary | ICD-10-CM | POA: Diagnosis present

## 2015-12-16 DIAGNOSIS — E119 Type 2 diabetes mellitus without complications: Secondary | ICD-10-CM | POA: Diagnosis not present

## 2015-12-16 DIAGNOSIS — Z9181 History of falling: Secondary | ICD-10-CM

## 2015-12-16 DIAGNOSIS — I25119 Atherosclerotic heart disease of native coronary artery with unspecified angina pectoris: Secondary | ICD-10-CM | POA: Diagnosis present

## 2015-12-16 DIAGNOSIS — M25551 Pain in right hip: Secondary | ICD-10-CM | POA: Diagnosis present

## 2015-12-16 DIAGNOSIS — Z79899 Other long term (current) drug therapy: Secondary | ICD-10-CM

## 2015-12-16 DIAGNOSIS — L8990 Pressure ulcer of unspecified site, unspecified stage: Secondary | ICD-10-CM | POA: Insufficient documentation

## 2015-12-16 DIAGNOSIS — J9610 Chronic respiratory failure, unspecified whether with hypoxia or hypercapnia: Secondary | ICD-10-CM | POA: Diagnosis present

## 2015-12-16 DIAGNOSIS — I10 Essential (primary) hypertension: Secondary | ICD-10-CM | POA: Diagnosis not present

## 2015-12-16 DIAGNOSIS — W010XXA Fall on same level from slipping, tripping and stumbling without subsequent striking against object, initial encounter: Secondary | ICD-10-CM | POA: Diagnosis present

## 2015-12-16 DIAGNOSIS — I4891 Unspecified atrial fibrillation: Secondary | ICD-10-CM | POA: Diagnosis present

## 2015-12-16 DIAGNOSIS — Z419 Encounter for procedure for purposes other than remedying health state, unspecified: Secondary | ICD-10-CM

## 2015-12-16 DIAGNOSIS — Z66 Do not resuscitate: Secondary | ICD-10-CM | POA: Diagnosis present

## 2015-12-16 DIAGNOSIS — D62 Acute posthemorrhagic anemia: Secondary | ICD-10-CM | POA: Diagnosis not present

## 2015-12-16 DIAGNOSIS — I719 Aortic aneurysm of unspecified site, without rupture: Secondary | ICD-10-CM | POA: Diagnosis present

## 2015-12-16 DIAGNOSIS — J441 Chronic obstructive pulmonary disease with (acute) exacerbation: Secondary | ICD-10-CM | POA: Diagnosis present

## 2015-12-16 DIAGNOSIS — I714 Abdominal aortic aneurysm, without rupture, unspecified: Secondary | ICD-10-CM

## 2015-12-16 DIAGNOSIS — Z87891 Personal history of nicotine dependence: Secondary | ICD-10-CM | POA: Diagnosis not present

## 2015-12-16 DIAGNOSIS — S72143A Displaced intertrochanteric fracture of unspecified femur, initial encounter for closed fracture: Secondary | ICD-10-CM | POA: Diagnosis present

## 2015-12-16 DIAGNOSIS — I5032 Chronic diastolic (congestive) heart failure: Secondary | ICD-10-CM | POA: Diagnosis present

## 2015-12-16 DIAGNOSIS — Z951 Presence of aortocoronary bypass graft: Secondary | ICD-10-CM

## 2015-12-16 DIAGNOSIS — I779 Disorder of arteries and arterioles, unspecified: Secondary | ICD-10-CM | POA: Diagnosis not present

## 2015-12-16 DIAGNOSIS — I482 Chronic atrial fibrillation: Secondary | ICD-10-CM | POA: Diagnosis not present

## 2015-12-16 DIAGNOSIS — S72001A Fracture of unspecified part of neck of right femur, initial encounter for closed fracture: Secondary | ICD-10-CM

## 2015-12-16 DIAGNOSIS — Z955 Presence of coronary angioplasty implant and graft: Secondary | ICD-10-CM

## 2015-12-16 DIAGNOSIS — Z9889 Other specified postprocedural states: Secondary | ICD-10-CM

## 2015-12-16 DIAGNOSIS — I481 Persistent atrial fibrillation: Secondary | ICD-10-CM

## 2015-12-16 DIAGNOSIS — Z794 Long term (current) use of insulin: Secondary | ICD-10-CM | POA: Diagnosis not present

## 2015-12-16 DIAGNOSIS — E1151 Type 2 diabetes mellitus with diabetic peripheral angiopathy without gangrene: Secondary | ICD-10-CM | POA: Diagnosis present

## 2015-12-16 DIAGNOSIS — I340 Nonrheumatic mitral (valve) insufficiency: Secondary | ICD-10-CM | POA: Diagnosis present

## 2015-12-16 DIAGNOSIS — I739 Peripheral vascular disease, unspecified: Secondary | ICD-10-CM

## 2015-12-16 DIAGNOSIS — Z01810 Encounter for preprocedural cardiovascular examination: Secondary | ICD-10-CM

## 2015-12-16 DIAGNOSIS — W1839XA Other fall on same level, initial encounter: Secondary | ICD-10-CM | POA: Diagnosis not present

## 2015-12-16 DIAGNOSIS — R55 Syncope and collapse: Secondary | ICD-10-CM | POA: Diagnosis present

## 2015-12-16 DIAGNOSIS — Z8781 Personal history of (healed) traumatic fracture: Secondary | ICD-10-CM

## 2015-12-16 DIAGNOSIS — S72141D Displaced intertrochanteric fracture of right femur, subsequent encounter for closed fracture with routine healing: Secondary | ICD-10-CM | POA: Diagnosis not present

## 2015-12-16 DIAGNOSIS — Z9981 Dependence on supplemental oxygen: Secondary | ICD-10-CM | POA: Diagnosis not present

## 2015-12-16 DIAGNOSIS — T1490XA Injury, unspecified, initial encounter: Secondary | ICD-10-CM

## 2015-12-16 DIAGNOSIS — R0602 Shortness of breath: Secondary | ICD-10-CM

## 2015-12-16 DIAGNOSIS — I272 Other secondary pulmonary hypertension: Secondary | ICD-10-CM | POA: Diagnosis present

## 2015-12-16 HISTORY — DX: Displaced intertrochanteric fracture of right femur, initial encounter for closed fracture: S72.141A

## 2015-12-16 LAB — BASIC METABOLIC PANEL
ANION GAP: 7 (ref 5–15)
BUN: 23 mg/dL — ABNORMAL HIGH (ref 6–20)
CHLORIDE: 105 mmol/L (ref 101–111)
CO2: 25 mmol/L (ref 22–32)
Calcium: 9.9 mg/dL (ref 8.9–10.3)
Creatinine, Ser: 0.85 mg/dL (ref 0.44–1.00)
GFR calc non Af Amer: >60 mL/min (ref >60)
Glucose, Bld: 189 mg/dL — ABNORMAL HIGH (ref 65–99)
Potassium: 4.1 mmol/L (ref 3.5–5.1)
Sodium: 137 mmol/L (ref 135–145)

## 2015-12-16 LAB — GLUCOSE, CAPILLARY: GLUCOSE-CAPILLARY: 185 mg/dL — AB (ref 65–99)

## 2015-12-16 LAB — CBC
HCT: 34.9 % — ABNORMAL LOW (ref 36.0–46.0)
Hemoglobin: 10.7 g/dL — ABNORMAL LOW (ref 12.0–15.0)
MCH: 27 pg (ref 26.0–34.0)
MCHC: 30.7 g/dL (ref 30.0–36.0)
MCV: 87.9 fL (ref 78.0–100.0)
PLATELETS: 202 10*3/uL (ref 150–400)
RBC: 3.97 MIL/uL (ref 3.87–5.11)
RDW: 14.3 % (ref 11.5–15.5)
WBC: 14.3 10*3/uL — AB (ref 4.0–10.5)

## 2015-12-16 LAB — HEPATIC FUNCTION PANEL
ALK PHOS: 48 U/L (ref 38–126)
ALT: 14 U/L (ref 14–54)
AST: 15 U/L (ref 15–41)
Albumin: 3.4 g/dL — ABNORMAL LOW (ref 3.5–5.0)
BILIRUBIN DIRECT: 0.1 mg/dL (ref 0.1–0.5)
BILIRUBIN TOTAL: 0.7 mg/dL (ref 0.3–1.2)
Indirect Bilirubin: 0.6 mg/dL (ref 0.3–0.9)
Total Protein: 5.7 g/dL — ABNORMAL LOW (ref 6.5–8.1)

## 2015-12-16 LAB — CBC WITH DIFFERENTIAL/PLATELET
Basophils Absolute: 0 10*3/uL (ref 0.0–0.1)
Basophils Relative: 0 %
Eosinophils Absolute: 0 10*3/uL (ref 0.0–0.7)
Eosinophils Relative: 0 %
HEMATOCRIT: 34.5 % — AB (ref 36.0–46.0)
HEMOGLOBIN: 10.5 g/dL — AB (ref 12.0–15.0)
LYMPHS ABS: 0.3 10*3/uL — AB (ref 0.7–4.0)
Lymphocytes Relative: 3 %
MCH: 26.9 pg (ref 26.0–34.0)
MCHC: 30.4 g/dL (ref 30.0–36.0)
MCV: 88.5 fL (ref 78.0–100.0)
MONOS PCT: 3 %
Monocytes Absolute: 0.4 10*3/uL (ref 0.1–1.0)
NEUTROS ABS: 10.8 10*3/uL — AB (ref 1.7–7.7)
NEUTROS PCT: 94 %
Platelets: 185 10*3/uL (ref 150–400)
RBC: 3.9 MIL/uL (ref 3.87–5.11)
RDW: 14.6 % (ref 11.5–15.5)
WBC: 11.6 10*3/uL — ABNORMAL HIGH (ref 4.0–10.5)

## 2015-12-16 LAB — CREATININE, SERUM: CREATININE: 0.86 mg/dL (ref 0.44–1.00)

## 2015-12-16 LAB — TROPONIN I: Troponin I: <0.03 ng/mL (ref <0.03)

## 2015-12-16 LAB — ABO/RH: ABO/RH(D): AB POS

## 2015-12-16 LAB — DIGOXIN LEVEL: Digoxin Level: 0.2 ng/mL — ABNORMAL LOW (ref 0.8–2.0)

## 2015-12-16 MED ORDER — FENTANYL CITRATE (PF) 100 MCG/2ML IJ SOLN
50.0000 ug | Freq: Once | INTRAMUSCULAR | Status: AC
  Administered 2015-12-16: 50 ug via INTRAVENOUS
  Filled 2015-12-16: qty 2

## 2015-12-16 MED ORDER — HYDROCORTISONE NA SUCCINATE PF 100 MG IJ SOLR
50.0000 mg | Freq: Two times a day (BID) | INTRAMUSCULAR | Status: DC
  Administered 2015-12-16 – 2015-12-17 (×2): 50 mg via INTRAVENOUS
  Filled 2015-12-16 (×2): qty 2

## 2015-12-16 MED ORDER — HYDROCODONE-ACETAMINOPHEN 5-325 MG PO TABS: 1.0000 | ORAL_TABLET | Freq: Four times a day (QID) | ORAL | Status: DC | PRN

## 2015-12-16 MED ORDER — CHLORHEXIDINE GLUCONATE 4 % EX LIQD: 60.0000 mL | Freq: Once | CUTANEOUS | Status: DC

## 2015-12-16 MED ORDER — MOMETASONE FURO-FORMOTEROL FUM 200-5 MCG/ACT IN AERO
2.0000 | INHALATION_SPRAY | Freq: Two times a day (BID) | RESPIRATORY_TRACT | Status: DC
  Administered 2015-12-16 – 2015-12-21 (×8): 2 via RESPIRATORY_TRACT
  Filled 2015-12-16: qty 8.8

## 2015-12-16 MED ORDER — POVIDONE-IODINE 10 % EX SWAB: 2.0000 "application " | Freq: Once | CUTANEOUS | Status: DC

## 2015-12-16 MED ORDER — ASPIRIN EC 81 MG PO TBEC: 81.0000 mg | DELAYED_RELEASE_TABLET | Freq: Every day | ORAL | Status: DC

## 2015-12-16 MED ORDER — SORBITOL 70 % SOLN
30.0000 mL | Freq: Every day | Status: DC | PRN
  Filled 2015-12-16: qty 30

## 2015-12-16 MED ORDER — ALBUTEROL SULFATE (2.5 MG/3ML) 0.083% IN NEBU: 3.0000 mL | INHALATION_SOLUTION | RESPIRATORY_TRACT | Status: DC | PRN

## 2015-12-16 MED ORDER — MAGNESIUM OXIDE 400 (241.3 MG) MG PO TABS: 400.0000 mg | ORAL_TABLET | Freq: Every day | ORAL | Status: DC

## 2015-12-16 MED ORDER — DIAZEPAM 2 MG PO TABS: 2.0000 mg | ORAL_TABLET | Freq: Four times a day (QID) | ORAL | Status: DC | PRN

## 2015-12-16 MED ORDER — POTASSIUM CHLORIDE CRYS ER 20 MEQ PO TBCR
20.0000 meq | EXTENDED_RELEASE_TABLET | Freq: Every day | ORAL | Status: DC
  Administered 2015-12-18 – 2015-12-20 (×3): 20 meq via ORAL
  Filled 2015-12-16 (×3): qty 1

## 2015-12-16 MED ORDER — ENOXAPARIN SODIUM 40 MG/0.4ML ~~LOC~~ SOLN
40.0000 mg | SUBCUTANEOUS | Status: DC
  Administered 2015-12-16: 40 mg via SUBCUTANEOUS
  Filled 2015-12-16: qty 0.4

## 2015-12-16 MED ORDER — SENNA 8.6 MG PO TABS
1.0000 | ORAL_TABLET | Freq: Two times a day (BID) | ORAL | Status: DC
  Administered 2015-12-16: 8.6 mg via ORAL
  Filled 2015-12-16: qty 1

## 2015-12-16 MED ORDER — ONDANSETRON HCL 4 MG PO TABS: 8.0000 mg | ORAL_TABLET | Freq: Three times a day (TID) | ORAL | Status: DC | PRN

## 2015-12-16 MED ORDER — CEFAZOLIN SODIUM-DEXTROSE 2-4 GM/100ML-% IV SOLN
2.0000 g | INTRAVENOUS | Status: AC
  Administered 2015-12-17: 2 g via INTRAVENOUS
  Filled 2015-12-16 (×2): qty 100

## 2015-12-16 MED ORDER — ATORVASTATIN CALCIUM 20 MG PO TABS
20.0000 mg | ORAL_TABLET | Freq: Every day | ORAL | Status: DC
  Administered 2015-12-16 – 2015-12-20 (×5): 20 mg via ORAL
  Filled 2015-12-16 (×5): qty 1

## 2015-12-16 MED ORDER — IOPAMIDOL (ISOVUE-370) INJECTION 76%
INTRAVENOUS | Status: AC
  Filled 2015-12-16: qty 100

## 2015-12-16 MED ORDER — TIOTROPIUM BROMIDE MONOHYDRATE 18 MCG IN CAPS
1.0000 | ORAL_CAPSULE | Freq: Every day | RESPIRATORY_TRACT | Status: DC
  Administered 2015-12-18 – 2015-12-21 (×4): 18 ug via RESPIRATORY_TRACT
  Filled 2015-12-16: qty 5

## 2015-12-16 MED ORDER — PANTOPRAZOLE SODIUM 40 MG PO TBEC
40.0000 mg | DELAYED_RELEASE_TABLET | Freq: Every day | ORAL | Status: DC
  Administered 2015-12-18 – 2015-12-21 (×4): 40 mg via ORAL
  Filled 2015-12-16 (×4): qty 1

## 2015-12-16 MED ORDER — MAGNESIUM OXIDE 400 (241.3 MG) MG PO TABS
400.0000 mg | ORAL_TABLET | Freq: Every day | ORAL | Status: DC
  Administered 2015-12-18 – 2015-12-21 (×4): 400 mg via ORAL
  Filled 2015-12-16 (×4): qty 1

## 2015-12-16 MED ORDER — FUROSEMIDE 40 MG PO TABS: 40.0000 mg | ORAL_TABLET | Freq: Every day | ORAL | Status: DC | PRN

## 2015-12-16 MED ORDER — POVIDONE-IODINE 10 % EX SWAB
2.0000 | Freq: Once | CUTANEOUS | Status: DC
Start: 2015-12-17 — End: 2015-12-17

## 2015-12-16 MED ORDER — INSULIN GLARGINE 100 UNIT/ML ~~LOC~~ SOLN
10.0000 [IU] | Freq: Every day | SUBCUTANEOUS | Status: DC
Start: 2015-12-16 — End: 2015-12-20
  Administered 2015-12-16 – 2015-12-19 (×4): 10 [IU] via SUBCUTANEOUS
  Filled 2015-12-16 (×5): qty 0.1

## 2015-12-16 MED ORDER — MORPHINE SULFATE (PF) 2 MG/ML IV SOLN
2.0000 mg | INTRAVENOUS | Status: DC | PRN
  Administered 2015-12-16 – 2015-12-17 (×2): 2 mg via INTRAVENOUS
  Filled 2015-12-16 (×2): qty 1

## 2015-12-16 MED ORDER — INSULIN ASPART 100 UNIT/ML ~~LOC~~ SOLN
0.0000 [IU] | Freq: Three times a day (TID) | SUBCUTANEOUS | Status: DC
  Administered 2015-12-17 – 2015-12-18 (×2): 1 [IU] via SUBCUTANEOUS
  Administered 2015-12-18: 2 [IU] via SUBCUTANEOUS
  Administered 2015-12-18: 1 [IU] via SUBCUTANEOUS
  Administered 2015-12-19: 2 [IU] via SUBCUTANEOUS
  Administered 2015-12-19: 3 [IU] via SUBCUTANEOUS
  Administered 2015-12-20 – 2015-12-21 (×2): 1 [IU] via SUBCUTANEOUS
  Administered 2015-12-21: 2 [IU] via SUBCUTANEOUS

## 2015-12-16 MED ORDER — CEFAZOLIN SODIUM-DEXTROSE 2-4 GM/100ML-% IV SOLN: 2.0000 g | INTRAVENOUS | Status: DC

## 2015-12-16 MED ORDER — DILTIAZEM HCL ER COATED BEADS 120 MG PO CP24
120.0000 mg | ORAL_CAPSULE | Freq: Every day | ORAL | Status: DC
  Administered 2015-12-18 – 2015-12-21 (×4): 120 mg via ORAL
  Filled 2015-12-16 (×6): qty 1

## 2015-12-16 MED ORDER — MORPHINE SULFATE (PF) 2 MG/ML IV SOLN: 0.5000 mg | INTRAVENOUS | Status: DC | PRN

## 2015-12-16 MED ORDER — POLYETHYLENE GLYCOL 3350 17 G PO PACK: 17.0000 g | PACK | Freq: Every day | ORAL | Status: DC | PRN

## 2015-12-16 MED ORDER — DIGOXIN 0.0625 MG HALF TABLET
0.0625 mg | ORAL_TABLET | Freq: Every day | ORAL | Status: DC
Start: 2015-12-17 — End: 2015-12-21
  Administered 2015-12-18 – 2015-12-21 (×4): 0.0625 mg via ORAL
  Filled 2015-12-16 (×5): qty 1

## 2015-12-16 MED ORDER — CHLORHEXIDINE GLUCONATE 4 % EX LIQD
60.0000 mL | Freq: Once | CUTANEOUS | Status: AC
  Administered 2015-12-17: 4 via TOPICAL
  Filled 2015-12-16: qty 60

## 2015-12-16 NOTE — ED Notes (Signed)
Phlebotomy called. Notified pt back in room

## 2015-12-16 NOTE — ED Notes (Signed)
RN attempt to obtain blood RN unsuccessful/.

## 2015-12-16 NOTE — ED Provider Notes (Signed)
[facility]-EMERGENCY DEPT Provider Note   CSN: [id number] Arrival date & time: 12/16/15  1345     History   Chief Complaint Chief Complaint  Patient presents with  . Fall    HPI [name] is a 82 y.o. female.  [name] is a 82 y.o. female with history of AAA, a.fib not on anticoagulation secondary to GI bleed, CAD s/p CABG, diastolic CHF last EF 55-60% (10/2014), COPD on home O2, T2DM, and h/o GI bleed presents to ED with complaint of fall. Patient reports she fell today in her kitchen on her right side. Unclear mechanism of fall. She reports hitting her head, but no LOC. Reports headache at right temple, site of where she hit her head. She has right hip pain, 10/10, unable to ambulate secondary to pain. She also endorses a small wound on right elbow from fall. She was given fentanyl for pain by EMS. No numbness or weakness. She endorses shortness of breath that is improving. No fever, chest pain, abdominal pain, nausea, dysuria, or hematuria. She denies being on anti-coagulation. Patient on prednisone.       Past Medical History:  Diagnosis Date  . AAA (abdominal aortic aneurysm) (HCC)   . Aneurysm, aortic (HCC)    thoracic aorta, stable at 4.1 cm, chest CT, May, 2012  . Bradycardia   . CAD (coronary artery disease)    a. s/p CABG x5 (2005) LIMA->LAD, rSVG->diag, seq r SVG -> 1st OM and distal LCX, rSVG->PDA b. s/p BMS-prox SVG-D2 (2014)  . Carotid artery disease (HCC)    Hx of endarterectomy. Doppler October, 2011, stable, 0-39% RIC A., 40-59% LICA  . CHF with left ventricular diastolic dysfunction, NYHA class 2 (HCC)    a. EF 55-60%, echo, April, 2013 b. EF 55-60%, mild biatrial enlargement and PASP 42 mmH  . Chronic atrial fibrillation (HCC)    Not felt to be an anticoagulation candidate secondary to hx of GIB/AVM  . Complication of anesthesia   . COPD (chronic obstructive pulmonary disease) (HCC)    on home oxygen  . GI bleed 2008   AVMs  . Hematoma    Right groin hematoma, small, post cath, April, 2014  . Hx of CABG    CABG 2005  . Hyperlipidemia   . Hypertension   . Hyponatremia    Chronic. Felt secondary to SIADH   . Leg ulcer (HCC)    Ulcerated lesion on the anterior aspect of her left lower leg, June, 2014  . Mitral regurgitation    Mild, hospital, March, 2014  . Syncope    Nitroglycerin plus a diuretic, April, 2009  . Tobacco abuse   . Tuberculosis    Exposures to tuberculosis 1970s, has tested negative by the health Department  . Type 2 diabetes mellitus (HCC)   . Venous stasis of lower extremity    Chronic    Patient Active Problem List   Diagnosis Date Noted  . DNR (do not resuscitate) 09/09/2015  . Altered mental status 05/01/2015  . AKI (acute kidney injury) (HCC)   . Coronary artery disease involving native coronary artery of native heart with angina pectoris (HCC)   . Chronic diastolic congestive heart failure (HCC)   . Insulin dependent type 2 diabetes mellitus (HCC)   . HCAP (healthcare-associated pneumonia) 03/18/2015  . Bile duct obstruction   . Elevated LFTs   . Elevated troponin 11/19/2014  . SOB (shortness of breath)   . Transaminitis 11/18/2014  . Pancreatic lesion 11/18/2014  .  Sepsis (HCC) 05/04/2014  . UTI (lower urinary tract infection) 05/04/2014  . Atrial fibrillation with RVR (HCC) 05/04/2014  . Atrial fibrillation with rapid ventricular response (HCC)   . Pyelonephritis   . Bronchitis, chronic obstructive w acute bronchitis (HCC) 02/02/2014  . COPD exacerbation (HCC) 02/01/2014  . Dyspnea 11/15/2013  . Atrial fibrillation with tachycardic ventricular rate (HCC) 11/15/2013  . Peripheral arterial disease (HCC) 10/17/2013  . Atypical chest pain 02/08/2013  . Cellulitis, leg 09/28/2012  . Leg ulcer (HCC)   . CAD (coronary artery disease)   . Hematoma   . Mitral regurgitation   . Chronic diastolic CHF (congestive heart failure) (HCC) 07/20/2012  . Anemia 07/20/2012  . Intermediate  coronary syndrome (HCC) 07/18/2012  . Acute exacerbation of chronic obstructive pulmonary disease (COPD) (HCC) 02/15/2012  . Dehydration 02/12/2012  . Alkalosis, metabolic 02/12/2012  . Generalized weakness 02/11/2012  . Atrial fibrillation (HCC)   . Chronic respiratory failure (HCC) 07/29/2011  . H/O steroid therapy 07/28/2011  . Edema   . Aortic valve sclerosis   . Hypertension   . Hyperlipidemia   . GI bleed   . Aneurysm, aortic (HCC)   . Syncope   . Hyponatremia   . COPD (chronic obstructive pulmonary disease) (HCC)   . Tobacco abuse   . Bradycardia   . Hx of CABG   . Ejection fraction   . Carotid artery disease (HCC)   . Tuberculosis   . OXYGEN-USE OF SUPPLEMENTAL 08/02/2009    Past Surgical History:  Procedure Laterality Date  . ABDOMINAL HYSTERECTOMY    . carotid endartercetomy    . CHOLECYSTECTOMY    . CORONARY ANGIOPLASTY WITH STENT PLACEMENT  07/18/12   severe native coronary artery disease, 99% proximal stenosis of the SVG-D2 status post successful PTCA/PCI with a Veriflex bare-metal stent, widely patent SVGs to both the right PDA sequential OM1 to OM 2, EF 65-70% and 3+ mitral regurgitation  . CORONARY ARTERY BYPASS GRAFT  2005  . ERCP N/A 11/20/2014   Procedure: ENDOSCOPIC RETROGRADE CHOLANGIOPANCREATOGRAPHY (ERCP);  Surgeon: Meryl Dare, MD;  Location: [facility] ENDOSCOPY;  Service: Endoscopy;  Laterality: N/A;  . LEFT HEART CATHETERIZATION WITH CORONARY ANGIOGRAM N/A 07/18/2012   Procedure: LEFT HEART CATHETERIZATION WITH CORONARY ANGIOGRAM;  Surgeon: Marykay Lex, MD;  Location[facility] CATH LAB;  Service: Cardiovascular;  Laterality: N/A;  . PERCUTANEOUS CORONARY STENT INTERVENTION (PCI-S)  07/18/2012   Procedure: PERCUTANEOUS CORONARY STENT INTERVENTION (PCI-S);  Surgeon: Marykay Lex, MD;  Location: [facility] CATH LAB;  Service: Cardiovascular;;    OB History    No data available       Home Medications    Prior to Admission medications   Medication Sig Start  Date End Date Taking? Authorizing Provider  albuterol (PROVENTIL HFA;VENTOLIN HFA) 108 (90 BASE) MCG/ACT inhaler Inhale 2 puffs into the lungs every 4 (four) hours as needed for wheezing or shortness of breath. 07/16/14  Yes Tammy S Parrett, NP  albuterol (PROVENTIL) (2.5 MG/3ML) 0.083% nebulizer solution Take 2.5 mg by nebulization every 6 (six) hours as needed for wheezing.   Yes Historical Provider, MD  ALPRAZolam (XANAX) 0.25 MG tablet Take 1 tablet (0.25 mg total) by mouth at bedtime as needed for anxiety. 11/23/14  Yes Leroy Sea, MD  aspirin EC 81 MG tablet Take 81 mg by mouth daily.   Yes Historical Provider, MD  atorvastatin (LIPITOR) 40 MG tablet Take 0.5 tablets (20 mg total) by mouth daily at 6 PM. 11/07/13  Yes Lawson Fiscal  Temple Pacini Gerhardt, NP  diazepam (VALIUM) 2 MG tablet Take 2 mg by mouth every 6 (six) hours as needed for anxiety.   Yes Historical Provider, MD  Digoxin 62.5 MCG TABS Take 0.0625 mg by mouth daily. 10/22/15  Yes Lars MassonKatarina H Nelson, MD  diltiazem (DILACOR XR) 120 MG 24 hr capsule Take 1 capsule (120 mg total) by mouth daily. 10/16/15  Yes Lars MassonKatarina H Nelson, MD  feeding supplement (BOOST / RESOURCE BREEZE) LIQD Take 1 Container by mouth 2 (two) times daily as needed (for supplementatioin).    Yes Historical Provider, MD  furosemide (LASIX) 40 MG tablet Take 1 tablet (40 mg total) by mouth daily as needed for fluid or edema. 10/16/15  Yes Lars MassonKatarina H Nelson, MD  insulin aspart (NOVOLOG) 100 UNIT/ML injection Before each meal 3 times a day, 140-199 - 2 units, 200-250 - 4 units, 251-299 - 6 units,  300-349 - 8 units,  350 or above 10 units. Dispense syringes and needles as needed, Ok to switch to PEN if approved. Substitute to any brand approved. DX DM2, Code E11.65 11/23/14  Yes Leroy SeaPrashant K Singh, MD  insulin glargine (LANTUS) 100 UNIT/ML injection Inject 0.1 mLs (10 Units total) into the skin at bedtime. 03/26/15  Yes Palma HolterKanishka G Gunadasa, MD  magnesium oxide (MAG-OX) 400 MG tablet Take 400  mg by mouth daily.   Yes Historical Provider, MD  nitroGLYCERIN (NITROSTAT) 0.4 MG SL tablet Place 0.4 mg under the tongue every 5 (five) minutes as needed for chest pain.   Yes Historical Provider, MD  ondansetron (ZOFRAN) 8 MG tablet Take 1 tablet (8 mg total) by mouth every 8 (eight) hours as needed for nausea or vomiting. 10/28/14  Yes Mancel BaleElliott Wentz, MD  OXYGEN Inhale 2-2.5 L into the lungs continuous. 2L continously   Yes Historical Provider, MD  pantoprazole (PROTONIX) 40 MG tablet Take 1 tablet (40 mg total) by mouth daily. 12/26/14  Yes Leone BrandLaura R Ingold, NP  polyethylene glycol (MIRALAX / GLYCOLAX) packet Take 17 g by mouth daily as needed for mild constipation or moderate constipation. Reported on 07/10/2015   Yes Historical Provider, MD  potassium chloride SA (K-DUR,KLOR-CON) 20 MEQ tablet Take 1 tablet (20 mEq total) by mouth daily. 03/28/15  Yes Palma HolterKanishka G Gunadasa, MD  predniSONE (DELTASONE) 20 MG tablet Take 60 mg by mouth daily with breakfast.   Yes Historical Provider, MD  senna (SENOKOT) 8.6 MG TABS tablet Take 8.6 mg by mouth daily as needed for mild constipation or moderate constipation.    Yes Historical Provider, MD  SYMBICORT 160-4.5 MCG/ACT inhaler USE 2 INHALATIONS TWICE A DAY 11/19/14  Yes Oretha Milchakesh V Alva, MD  Tiotropium Bromide Monohydrate (SPIRIVA RESPIMAT) 2.5 MCG/ACT AERS Inhale 2 puffs into the lungs daily.   Yes Historical Provider, MD  Amino Acids-Protein Hydrolys (FEEDING SUPPLEMENT, PRO-STAT SUGAR FREE 64,) LIQD Take 30 mLs by mouth daily. 03/26/15   Palma HolterKanishka G Gunadasa, MD  feeding supplement, ENSURE ENLIVE, (ENSURE ENLIVE) LIQD Take 237 mLs by mouth daily. Patient not taking: Reported on 12/16/2015 03/26/15   Palma HolterKanishka G Gunadasa, MD  SPIRIVA HANDIHALER 18 MCG inhalation capsule INHALE THE CONTENTS OF 1 CAPSULE ONCE DAILY Patient not taking: Reported on 12/16/2015 06/03/15   Oretha Milchakesh V Alva, MD    Family History Family History  Problem Relation Age of Onset  . Congestive Heart  Failure Mother   . Lung cancer Father   . Stroke Sister   . Breast cancer Sister   . Breast cancer Brother   .  Lung cancer Sister   . Prostate cancer Brother   . Heart attack Neg Hx   . Hypertension Neg Hx     Social History Social History  Substance Use Topics  . Smoking status: Former Smoker    Packs/day: 0.50    Years: 59.00    Types: Cigarettes    Quit date: 02/01/2013  . Smokeless tobacco: Not on file  . Alcohol use No     Allergies   Other; Codeine; Sulfonamide derivatives; Warfarin; Penicillins; and Ranitidine hcl   Review of Systems Review of Systems  Constitutional: Negative for chills, diaphoresis and fever.  HENT: Negative for trouble swallowing.        C/o URI like sxs  Eyes: Negative for visual disturbance.  Respiratory: Positive for shortness of breath.   Cardiovascular: Negative for chest pain.  Gastrointestinal: Negative for abdominal pain, constipation, diarrhea, nausea and vomiting.  Genitourinary: Negative for dysuria and hematuria.  Musculoskeletal: Positive for arthralgias ( right hip, right elbow) and gait problem ( secondary to right hip ). Negative for neck pain.  Skin: Negative for rash.  Neurological: Positive for headaches. Negative for syncope and numbness.     Physical Exam Updated Vital Signs BP 136/66 (BP Location: Right Arm)   Pulse 69   Temp 97.8 F (36.6 C)   Resp 24   SpO2 95%   Physical Exam  Constitutional: She appears cachectic. She appears ill (chronically). No distress.  HENT:  Head: Normocephalic. Head is without raccoon's eyes and without Battle's sign.  Mouth/Throat: Uvula is midline. Mucous membranes are dry. No trismus in the jaw. No uvula swelling. No oropharyngeal exudate.  Eyes: Conjunctivae and EOM are normal. Pupils are equal, round, and reactive to light. Right eye exhibits no discharge. Left eye exhibits no discharge. No scleral icterus.  Neck: Normal range of motion and phonation normal. Neck supple. No  spinous process tenderness and no muscular tenderness present. No neck rigidity. Normal range of motion present.  Cardiovascular: Normal rate, regular rhythm, normal heart sounds and intact distal pulses.   No murmur heard. Pulmonary/Chest: No stridor. Tachypnea noted. No respiratory distress. She has decreased breath sounds ( diffuse).  Patient on [place] O2.   Abdominal: Soft. Bowel sounds are normal. There is no tenderness. There is no rigidity, no rebound and no guarding.  Musculoskeletal: Normal range of motion.  Patient has superficial abrasion noted to right elbow with surround contusion. ROM intact; however, patient reports pain. Minimal TTP of bony aspects, mild TTP around abrasion. Sensation intact. 2+ radial pulses.  Right lower extremity is externally rotated and shortened.  Sensation intact. Patient able to move toes. DP 1+ pulses b/l.   Lymphadenopathy:    She has no cervical adenopathy.  Neurological: She is alert. She is not disoriented. No sensory deficit. Coordination normal. GCS eye subscore is 4. GCS verbal subscore is 5. GCS motor subscore is 6.  Skin: Skin is warm and dry. She is not diaphoretic.  Psychiatric: She has a normal mood and affect.     ED Treatments / Results  Labs (all labs ordered are listed, but only abnormal results are displayed) Labs Reviewed  BASIC METABOLIC PANEL - Abnormal; Notable for the following:       Result Value   Glucose, Bld 189 (*)    BUN 23 (*)    All other components within normal limits  CBC WITH DIFFERENTIAL/PLATELET - Abnormal; Notable for the following:    WBC 11.6 (*)    Hemoglobin 10.5 (*)  HCT 34.5 (*)    Neutro Abs 10.8 (*)    Lymphs Abs 0.3 (*)    All other components within normal limits  URINALYSIS, ROUTINE W REFLEX MICROSCOPIC (NOT AT [facility])  URINALYSIS, ROUTINE W REFLEX MICROSCOPIC (NOT AT [facility])    EKG  EKG Interpretation  Date/Time:  Monday December 16 2015 15:20:42 EDT Ventricular Rate:  77 PR Interval:    QRS  Duration: 101 QT Interval:  363 QTC Calculation: 411 R Axis:   -61 Text Interpretation:  Atrial fibrillation Left axis deviation Left anterior fascicular block No significant change since last tracing Confirmed by Denton Lank  MD, Caryn Bee ([id number]) on 12/16/2015 3:23:03 PM       Radiology Dg Chest 2 View  Result Date: 12/16/2015 CLINICAL DATA:  Patient comes from home, states got up to the restroom and fell. Right leg is externally rotated. Pt having right hip pain and right elbow pain, pt has small laceration to posterior elbow. Hx HTN, TB, CABG, COPD, afib, CHF, CAD EXAM: CHEST  2 VIEW COMPARISON:  07/10/2015 FINDINGS: Status post median sternotomy and CABG. The heart is normal in size. There are bilateral pleural effusions and bibasilar opacities consistent atelectasis or early infiltrates. There is no pneumothorax. Patient is kyphotic with wedge compression fractures at numerous thoracic levels, likely chronic. IMPRESSION: 1. Bilateral pleural effusions and bibasilar atelectasis or early infiltrates. 2. No acute fracture. Electronically Signed   By: Norva Pavlov M.D.   On: 12/16/2015 15:13   Dg Elbow Complete Right  Result Date: 12/16/2015 CLINICAL DATA:  Patient comes from home, states got up to the restroom and fell. Right leg is externally rotated. Pt having right hip pain and right elbow pain, pt has small laceration to posterior elbow. Hx HTN, TB, CABG, COPD, afib, CHF, CAD EXAM: RIGHT ELBOW - COMPLETE 3+ VIEW COMPARISON:  None. FINDINGS: There is no evidence of fracture, dislocation, or joint effusion. There is no evidence of arthropathy or other focal bone abnormality. Soft tissues are unremarkable. IMPRESSION: Negative. Electronically Signed   By: Norva Pavlov M.D.   On: 12/16/2015 15:06   Ct Head Wo Contrast  Result Date: 12/16/2015 CLINICAL DATA:  Fall in bathroom at home.  Right scalp hematoma EXAM: CT HEAD WITHOUT CONTRAST CT CERVICAL SPINE WITHOUT CONTRAST TECHNIQUE: Multidetector  CT imaging of the head and cervical spine was performed following the standard protocol without intravenous contrast. Multiplanar CT image reconstructions of the cervical spine were also generated. COMPARISON:  05/01/2015 FINDINGS: CT HEAD FINDINGS There is atrophy and chronic small vessel disease changes. No acute intracranial abnormality. Specifically, no hemorrhage, hydrocephalus, mass lesion, acute infarction, or significant intracranial injury. Mild soft tissue swelling in the right temporal region. Visualized paranasal sinuses and mastoids clear. Orbital soft tissues unremarkable. CT CERVICAL SPINE FINDINGS Normal alignment. Degenerative disc and facet disease throughout the cervical spine. Prevertebral soft tissues are normal. No fracture. No epidural or paraspinal hematoma. Airspace opacities noted in the apices bilaterally, right greater than left. This likely reflects scarring, but elective chest CT would be helpful to completely exclude mass, particularly in the right apex. IMPRESSION: No acute intracranial abnormality.Atrophy, chronic microvascular disease. Cervical spondylosis.  No acute bony abnormality. Densities in the apices bilaterally, likely scarring. However, elective chest CT would be helpful to completely exclude mass in the right upper lobe. Electronically Signed   By: Charlett Nose M.D.   On: 12/16/2015 15:30   Ct Cervical Spine Wo Contrast  Result Date: 12/16/2015 CLINICAL DATA:  Fall in bathroom  at home.  Right scalp hematoma EXAM: CT HEAD WITHOUT CONTRAST CT CERVICAL SPINE WITHOUT CONTRAST TECHNIQUE: Multidetector CT imaging of the head and cervical spine was performed following the standard protocol without intravenous contrast. Multiplanar CT image reconstructions of the cervical spine were also generated. COMPARISON:  05/01/2015 FINDINGS: CT HEAD FINDINGS There is atrophy and chronic small vessel disease changes. No acute intracranial abnormality. Specifically, no hemorrhage,  hydrocephalus, mass lesion, acute infarction, or significant intracranial injury. Mild soft tissue swelling in the right temporal region. Visualized paranasal sinuses and mastoids clear. Orbital soft tissues unremarkable. CT CERVICAL SPINE FINDINGS Normal alignment. Degenerative disc and facet disease throughout the cervical spine. Prevertebral soft tissues are normal. No fracture. No epidural or paraspinal hematoma. Airspace opacities noted in the apices bilaterally, right greater than left. This likely reflects scarring, but elective chest CT would be helpful to completely exclude mass, particularly in the right apex. IMPRESSION: No acute intracranial abnormality.Atrophy, chronic microvascular disease. Cervical spondylosis.  No acute bony abnormality. Densities in the apices bilaterally, likely scarring. However, elective chest CT would be helpful to completely exclude mass in the right upper lobe. Electronically Signed   By: Charlett NoseKevin  Dover M.D.   On: 12/16/2015 15:30   Dg Hip Unilat With Pelvis 2-3 Views Right  Result Date: 12/16/2015 CLINICAL DATA:  Fall.  Right hip pain. EXAM: DG HIP (WITH OR WITHOUT PELVIS) 2-3V RIGHT COMPARISON:  None. FINDINGS: Changes of prior left hip replacement. There is the comminuted right femoral intertrochanteric fracture with varus angulation. No subluxation or dislocation. IMPRESSION: Right intertrochanteric fracture with varus angulation. Electronically Signed   By: Charlett NoseKevin  Dover M.D.   On: 12/16/2015 15:09    Procedures Procedures (including critical care time)  Medications Ordered in ED Medications  fentaNYL (SUBLIMAZE) injection 50 mcg (50 mcg Intravenous Given 12/16/15 1431)  fentaNYL (SUBLIMAZE) injection 50 mcg (50 mcg Intravenous Given 12/16/15 1640)     Initial Impression / Assessment and Plan / ED Course  I have reviewed the triage vital signs and the nursing notes.  Pertinent labs & imaging results that were available during my care of the patient were  reviewed by me and considered in my medical decision making (see chart for details).  Clinical Course  Value Comment By Time  CT Head Wo Contrast Reviewed Lona KettleAshley Laurel Courteney Alderete, PA-C 08/21 1700  CT Cervical Spine Wo Contrast Reviewed Lona KettleAshley Laurel Ayssa Bentivegna, PA-C 08/21 1701  DG Hip Unilat With Pelvis 2-3 Views Right Fracture noted at right greater trochanter Lona KettleAshley Laurel Kosei Rhodes, PA-C 08/21 1701  DG Elbow Complete Right No obvious fracture or dislocation Lona Kettleshley Laurel Halimah Bewick, PA-C 08/21 1701  CT Head Wo Contrast Reviewed Lona KettleAshley Laurel Zael Shuman, PA-C 08/21 1702  CT Cervical Spine Wo Contrast Reviewed Lona KettleAshley Laurel Leannah Guse, PA-C 08/21 1702  DG Chest 2 View Reviewed Lona Kettleshley Laurel Eldora Napp, [place]-C 08/21 1704    Patient is afebrile and non-toxic appearing. She is chronically ill appearing. Vital signs remarkable for tachypnea. She is on [place] O2. Physical exam remarkable for externally rotated and shortened right lower extremity. Superficial abrasion and contusion noted to right elbow with mild pain. Dry mucous membranes. Concern for possible hip fracture - will x-ray. Given fall and abrasion on right elbow - will x-ray. Check basic labs. Check CXR for possibility of needing surgery if broken hip. CT head/neck - patient reports hitting head, no LOC, knot noted to right temple. Pain medication given.   Hyperglycemia noted without AG. Mild elevation in WBC. Hemoglobin at 10.5, stable compared to  previous. EKG remarkable for a.fib, no acute changes. Right elbow x-ray negative for acute fracture or dislocation. CT head and neck negative for acute abnormality. CXR remarkable for b/l pleural effusions and atelectasis vs. Early infiltrates - patient is afebrile, denies cough, suspect atelectasis. Hip/pelvis x-ray remarkable for right intertrochanteric fracture with varus angulation. Will consult Orthopedic Surgery.     4:18 PM: Spoke with Dr. Osie Bond of orthopedics, greatly appreciated his time and input. Request admission to  medicine. Patient will need cardiac clearance. Plan for surgery at the earliest possibly tomorrow. Will consult hospitalist for admission.   5:06 PM: Spoke with Dr. Susie Cassette of [facility], greatly appreciated her time and input, agrees to admit patient,   Final Clinical Impressions(s) / ED Diagnoses   Final diagnoses:  Closed intertrochanteric fracture of hip, right, initial encounter [facility])  Fall from slip, trip, or stumble, initial encounter    New Prescriptions New Prescriptions   No medications on file     Lona Kettle, PA-C 12/16/15 1707    Cathren Laine, MD 12/19/15 1002

## 2015-12-16 NOTE — ED Triage Notes (Signed)
Patient comes from home states got up to the restroom and fell. Patient complains of right hip pain 9/10 patient given 100 mcg of fentanyl . Right leg noted to be externally rotated. Patient alert and oriented x4 on arrival. Skin tear noted to the right elbow.

## 2015-12-16 NOTE — ED Notes (Addendum)
Social Work at bedside 

## 2015-12-16 NOTE — ED Notes (Signed)
Attempted report x 2 

## 2015-12-16 NOTE — ED Notes (Signed)
Report given to katie

## 2015-12-16 NOTE — H&P (Addendum)
[facility] History and Physical  Karen Lynntta S Herne YQM:[id number]:[id number] DOB: 29-Nov-1929 DOA: 12/16/2015  Referring physician:   PCP: Dois DavenportICHTER,KAREN L., MD   Chief Complaint: Fall   HPI:  [name] is a 82 y.o. 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 She reports hitting her head, but no LOC. Reports headache at right temple, site of where she hit her head. She has right hip pain, 10/10, unable to ambulate secondary to pain. She also endorses a small wound on right elbow from fall. She is not anticoagulation candidate due to GI bleed in the past. Cardiac hx with a fib, chronic , CAD with CABG in 2005  . Last cath 06/2012:Severe native coronary disease as described.  Last echo 10/2014  The estimated ejection fraction was in the range of 55% to 60%. - Pulmonary arteries: PA peak pressure: 35 mm Hg (S).  In the ER the patient had the following workup-Right elbow x-ray negative for acute fracture or dislocation. CT head and neck negative for acute abnormality. CXR remarkable for b/l pleural effusions and atelectasis vs. Early infiltrates - patient is afebrile, denies cough, suspect atelectasis. Hip/pelvis x-ray remarkable for right intertrochanteric fracture with varus angulation.Dr. Osie BondLandeau of orthopedics requested preop clearance due to multiple cardiovascular risk factors    Review of Systems: negative for the following  Constitutional: Denies fever, chills, diaphoresis, appetite change and fatigue.  HEENT: Denies photophobia, eye pain, redness, hearing loss, ear pain, congestion, sore throat, rhinorrhea, sneezing, mouth sores, trouble swallowing, neck pain, neck stiffness and tinnitus.  Respiratory:  Chronic SOB, DOE, cough, chest tightness, and wheezing.  Cardiovascular: Denies chest pain, palpitations and leg swelling.  Gastrointestinal: Denies nausea, vomiting, abdominal pain, diarrhea, constipation, blood in stool and abdominal distention.  Genitourinary: Denies dysuria, urgency, frequency, hematuria, flank pain and difficulty urinating.  Musculoskeletal:  Positive for headache, right ear pain, right shin pain, Skin: Denies pallor, rash and wound.  Neurological: Denies dizziness, seizures, syncope, weakness, light-headedness, numbness and headaches.  Hematological: Denies adenopathy. Easy bruising, personal or family bleeding history  Psychiatric/Behavioral: Denies suicidal ideation, mood changes, confusion, nervousness, sleep disturbance and agitation       Past Medical History:  Diagnosis Date  . AAA (abdominal aortic aneurysm) (HCC)   . Aneurysm, aortic (HCC)    thoracic aorta, stable at 4.1 cm, chest CT, May, 2012  . Bradycardia   . CAD (coronary artery disease)    a. s/p CABG x5 (2005) LIMA->LAD, rSVG->diag, seq r SVG -> 1st OM and distal LCX, rSVG->PDA b. s/p BMS-prox SVG-D2 (2014)  . Carotid artery disease (HCC)    Hx of endarterectomy. Doppler October, 2011, stable, 0-39% RIC A., 40-59% LICA  . CHF with left ventricular diastolic dysfunction, NYHA class 2 (HCC)    a. EF 55-60%, echo, April, 2013 b. EF 55-60%, mild biatrial enlargement and PASP 42 mmH  . Chronic atrial fibrillation (HCC)    Not felt to be an anticoagulation candidate secondary to hx of GIB/AVM  . Complication of anesthesia   . COPD (chronic obstructive pulmonary disease) (HCC)    on home oxygen  . GI bleed 2008   AVMs  . Hematoma    Right groin hematoma, small, post cath, April, 2014  . Hx of CABG  CABG 2005  . Hyperlipidemia   . Hypertension   . Hyponatremia    Chronic. Felt secondary to SIADH   . Leg ulcer (HCC)    Ulcerated lesion on the anterior aspect of her left lower leg, June, 2014   . Mitral regurgitation    Mild, hospital, March, 2014  . Syncope    Nitroglycerin plus a diuretic, April, 2009  . Tobacco abuse   . Tuberculosis    Exposures to tuberculosis 1970s, has tested negative by the health Department  . Type 2 diabetes mellitus (HCC)   . Venous stasis of lower extremity    Chronic     Past Surgical History:  Procedure Laterality Date  . ABDOMINAL HYSTERECTOMY    . carotid endartercetomy    . CHOLECYSTECTOMY    . CORONARY ANGIOPLASTY WITH STENT PLACEMENT  07/18/12   severe native coronary artery disease, 99% proximal stenosis of the SVG-D2 status post successful PTCA/PCI with a Veriflex bare-metal stent, widely patent SVGs to both the right PDA sequential OM1 to OM 2, EF 65-70% and 3+ mitral regurgitation  . CORONARY ARTERY BYPASS GRAFT  2005  . ERCP N/A 11/20/2014   Procedure: ENDOSCOPIC RETROGRADE CHOLANGIOPANCREATOGRAPHY (ERCP);  Surgeon: Meryl Dare, MD;  Location: [facility] ENDOSCOPY;  Service: Endoscopy;  Laterality: N/A;  . LEFT HEART CATHETERIZATION WITH CORONARY ANGIOGRAM N/A 07/18/2012   Procedure: LEFT HEART CATHETERIZATION WITH CORONARY ANGIOGRAM;  Surgeon: Marykay Lex, MD;  Location: [facility] CATH LAB;  Service: Cardiovascular;  Laterality: N/A;  . PERCUTANEOUS CORONARY STENT INTERVENTION (PCI-S)  07/18/2012   Procedure: PERCUTANEOUS CORONARY STENT INTERVENTION (PCI-S);  Surgeon: Marykay Lex, MD;  Location: [facility] CATH LAB;  Service: Cardiovascular;;      Social History:  reports that she quit smoking about 2 years ago. Her smoking use included Cigarettes. She has a 29.50 pack-year smoking history. She does not have any smokeless tobacco history on file. She reports that she does not drink alcohol or use drugs.    Allergies  Allergen Reactions  . Other Other (See Comments)    TETANUS-can only take 1/2 dose at one time, can take the other 1/2 dose about 3 days later  . Codeine Nausea And Vomiting  . Sulfonamide Derivatives Nausea And Vomiting  .  Warfarin     H/o AVM's prechief complaintcluding anticoagulation  . Penicillins Itching, Rash and Other (See Comments)    Nasal itching   . Ranitidine Hcl Itching and Rash    Family History  Problem Relation Age of Onset  . Congestive Heart Failure Mother   . Lung cancer Father   . Stroke Sister   . Breast cancer Sister   . Breast cancer Brother   . Lung cancer Sister   . Prostate cancer Brother   . Heart attack Neg Hx   . Hypertension Neg Hx          Prior to Admission medications   Medication Sig Start Date End Date Taking? Authorizing Provider  albuterol (PROVENTIL HFA;VENTOLIN HFA) 108 (90 BASE) MCG/ACT inhaler Inhale 2 puffs into the lungs every 4 (four) hours as needed for wheezing or shortness of breath. 07/16/14  Yes Tammy S Parrett, NP  albuterol (PROVENTIL) (2.5 MG/3ML) 0.083% nebulizer solution Take 2.5 mg by nebulization every 6 (six) hours as needed for wheezing.   Yes Historical Provider, MD  ALPRAZolam (XANAX) 0.25 MG tablet Take 1 tablet (0.25 mg total) by mouth at bedtime as needed for anxiety. 11/23/14  Yes Leroy Sea, MD  aspirin EC 81 MG tablet Take 81 mg by mouth daily.   Yes Historical Provider, MD  atorvastatin (LIPITOR) 40 MG tablet Take 0.5 tablets (20 mg total) by mouth daily at 6 PM. 11/07/13  Yes Rosalio Macadamia, NP  diazepam (VALIUM) 2 MG tablet Take 2 mg by mouth every 6 (six) hours as needed for anxiety.   Yes Historical Provider, MD  Digoxin 62.5 MCG TABS Take 0.0625 mg by mouth daily. 10/22/15  Yes Lars Masson, MD  diltiazem (DILACOR XR) 120 MG 24 hr capsule Take 1 capsule (120 mg total) by mouth daily. 10/16/15  Yes Lars Masson, MD  feeding supplement (BOOST / RESOURCE BREEZE) LIQD Take 1 Container by mouth 2 (two) times daily as needed (for supplementatioin).    Yes Historical Provider, MD  furosemide (LASIX) 40 MG tablet Take 1 tablet (40 mg total) by mouth daily as needed for fluid or edema. 10/16/15  Yes Lars Masson, MD   insulin aspart (NOVOLOG) 100 UNIT/ML injection Before each meal 3 times a day, 140-199 - 2 units, 200-250 - 4 units, 251-299 - 6 units,  300-349 - 8 units,  350 or above 10 units. Dispense syringes and needles as needed, Ok to switch to PEN if approved. Substitute to any brand approved. DX DM2, Code E11.65 11/23/14  Yes Leroy Sea, MD  insulin glargine (LANTUS) 100 UNIT/ML injection Inject 0.1 mLs (10 Units total) into the skin at bedtime. 03/26/15  Yes Palma Holter, MD  magnesium oxide (MAG-OX) 400 MG tablet Take 400 mg by mouth daily.   Yes Historical Provider, MD  nitroGLYCERIN (NITROSTAT) 0.4 MG SL tablet Place 0.4 mg under the tongue every 5 (five) minutes as needed for chest pain.   Yes Historical Provider, MD  ondansetron (ZOFRAN) 8 MG tablet Take 1 tablet (8 mg total) by mouth every 8 (eight) hours as needed for nausea or vomiting. 10/28/14  Yes Mancel Bale, MD  OXYGEN Inhale 2-2.5 L into the lungs continuous. 2L continously   Yes Historical Provider, MD  pantoprazole (PROTONIX) 40 MG tablet Take 1 tablet (40 mg total) by mouth daily. 12/26/14  Yes Leone Brand, NP  polyethylene glycol (MIRALAX / GLYCOLAX) packet Take 17 g by mouth daily as needed for mild constipation or moderate constipation. Reported on 07/10/2015   Yes Historical Provider, MD  potassium chloride SA (K-DUR,KLOR-CON) 20 MEQ tablet Take 1 tablet (20 mEq total) by mouth daily. 03/28/15  Yes Palma Holter, MD  predniSONE (DELTASONE) 20 MG tablet Take 60 mg by mouth daily with breakfast.   Yes Historical Provider, MD  senna (SENOKOT) 8.6 MG TABS tablet Take 8.6 mg by mouth daily as needed for mild constipation or moderate constipation.    Yes Historical Provider, MD  SYMBICORT 160-4.5 MCG/ACT inhaler USE 2 INHALATIONS TWICE A DAY 11/19/14  Yes Oretha Milch, MD  Tiotropium Bromide Monohydrate (SPIRIVA RESPIMAT) 2.5 MCG/ACT AERS Inhale 2 puffs into the lungs daily.   Yes Historical Provider, MD  Amino  Acids-Protein Hydrolys (FEEDING SUPPLEMENT, PRO-STAT SUGAR FREE 64,) LIQD Take 30 mLs by mouth daily. 03/26/15   Palma Holter, MD  feeding supplement, ENSURE ENLIVE, (ENSURE ENLIVE) LIQD Take 237 mLs by mouth daily. Patient not taking: Reported on 12/16/2015 03/26/15   Palma Holter, MD  SPIRIVA HANDIHALER 18 MCG inhalation capsule INHALE THE CONTENTS OF 1 CAPSULE ONCE DAILY Patient not taking: Reported on 12/16/2015 06/03/15   Oretha Milch, MD  Physical Exam: Vitals:   12/16/15 1351 12/16/15 1530 12/16/15 1545  BP: 136/66 146/79 140/72  Pulse: 69 84   Resp: 24 21 15   Temp: 97.8 F (36.6 C)    SpO2: 95% 95% 97%      Constitutional: Cachectic, chronically ill-appearing Vitals:   12/16/15 1351 12/16/15 1530 12/16/15 1545  BP: 136/66 146/79 140/72  Pulse: 69 84   Resp: 24 21 15   Temp: 97.8 F (36.6 C)    SpO2: 95% 95% 97%   Eyes: PERRL, lids and conjunctivae normal ENMT: Mucous membranes are moist. Posterior pharynx clear of any exudate or lesions.Normal dentition.  Neck: normal, supple, no masses, no thyromegaly Cardiovascular: Regular rate and rhythm, no murmurs / rubs / gallops. No extremity edema. 2+ pedal pulses. No carotid bruits.  Abdomen: no tenderness, no masses palpated. No hepatosplenomegaly. Bowel sounds positive.  Pulmonary/Chest: No stridor. Tachypnea noted. No respiratory distress. She has decreased breath sounds ( diffuse).  Patient on [place] O2.   Abdominal: Soft. Bowel sounds are normal. There is no tenderness. There is no rigidity, no rebound and no guarding.  Musculoskeletal: Normal range of motion.  Patient has superficial abrasion noted to right elbow with surround contusion. ROM intact; however, patient reports pain. Minimal TTP of bony aspects, mild TTP around abrasion. Sensation intact. 2+ radial pulses.  Right lower extremity is externally rotated and shortened.  Sensation intact. Patient able to move toes. DP 1+ pulses b/l.  Skin: no rashes,  lesions, ulcers. No induration Neurologic: CN 2-12 grossly intact. Sensation intact, DTR normal. Strength 5/5 in all 4.  Psychiatric: Normal judgment and insight. Alert and oriented x 3. Normal mood.     Labs on Admission: I have personally reviewed following labs and imaging studies  CBC:  Recent Labs Lab 12/16/15 1548  WBC 11.6*  NEUTROABS 10.8*  HGB 10.5*  HCT 34.5*  MCV 88.5  PLT 185    Basic Metabolic Panel:  Recent Labs Lab 12/16/15 1548  NA 137  K 4.1  CL 105  CO2 25  GLUCOSE 189*  BUN 23*  CREATININE 0.85  CALCIUM 9.9    GFR: Estimated Creatinine Clearance: 56.9 mL/min (by C-G formula based on SCr of 0.85 mg/dL).  Liver Function Tests: No results for input(s): AST, ALT, ALKPHOS, BILITOT, PROT, ALBUMIN in the last 168 hours. No results for input(s): LIPASE, AMYLASE in the last 168 hours. No results for input(s): AMMONIA in the last 168 hours.  Coagulation Profile: No results for input(s): INR, PROTIME in the last 168 hours. No results for input(s): DDIMER in the last 72 hours.  Cardiac Enzymes: No results for input(s): CKTOTAL, CKMB, CKMBINDEX, TROPONINI in the last 168 hours.  BNP (last 3 results) No results for input(s): PROBNP in the last 8760 hours.  HbA1C: No results for input(s): HGBA1C in the last 72 hours. Lab Results  Component Value Date   HGBA1C 5.7 (H) 04/17/2015   HGBA1C 5.5 11/18/2014   HGBA1C 5.8 (H) 02/02/2014     CBG: No results for input(s): GLUCAP in the last 168 hours.  Lipid Profile: No results for input(s): CHOL, HDL, LDLCALC, TRIG, CHOLHDL, LDLDIRECT in the last 72 hours.  Thyroid Function Tests: No results for input(s): TSH, T4TOTAL, FREET4, T3FREE, THYROIDAB in the last 72 hours.  Anemia Panel: No results for input(s): VITAMINB12, FOLATE, FERRITIN, TIBC, IRON, RETICCTPCT in the last 72 hours.  Urine analysis:    Component Value Date/Time   COLORURINE YELLOW 05/01/2015 0104   APPEARANCEUR CLEAR 05/01/2015  0104   LABSPEC 1.017 05/01/2015 0104   PHURINE 5.5 05/01/2015 0104   GLUCOSEU NEGATIVE 05/01/2015 0104   HGBUR NEGATIVE 05/01/2015 0104   BILIRUBINUR MODERATE (A) 05/01/2015 0104   KETONESUR 15 (A) 05/01/2015 0104   PROTEINUR NEGATIVE 05/01/2015 0104   UROBILINOGEN 1.0 11/18/2014 1541   NITRITE NEGATIVE 05/01/2015 0104   LEUKOCYTESUR MODERATE (A) 05/01/2015 0104    Sepsis Labs: @LABRCNTIP (procalcitonin:4,lacticidven:4) )No results found for this or any previous visit (from the past 240 hour(s)).       Radiological Exams on Admission: Dg Chest 2 View  Result Date: 12/16/2015 CLINICAL DATA:  Patient comes from home, states got up to the restroom and fell. Right leg is externally rotated. Pt having right hip pain and right elbow pain, pt has small laceration to posterior elbow. Hx HTN, TB, CABG, COPD, afib, CHF, CAD EXAM: CHEST  2 VIEW COMPARISON:  07/10/2015 FINDINGS: Status post median sternotomy and CABG. The heart is normal in size. There are bilateral pleural effusions and bibasilar opacities consistent atelectasis or early infiltrates. There is no pneumothorax. Patient is kyphotic with wedge compression fractures at numerous thoracic levels, likely chronic. IMPRESSION: 1. Bilateral pleural effusions and bibasilar atelectasis or early infiltrates. 2. No acute fracture. Electronically Signed   By: Norva PavlovElizabeth  Brown M.D.   On: 12/16/2015 15:13   Dg Elbow Complete Right  Result Date: 12/16/2015 CLINICAL DATA:  Patient comes from home, states got up to the restroom and fell. Right leg is externally rotated. Pt having right hip pain and right elbow pain, pt has small laceration to posterior elbow. Hx HTN, TB, CABG, COPD, afib, CHF, CAD EXAM: RIGHT ELBOW - COMPLETE 3+ VIEW COMPARISON:  None. FINDINGS: There is no evidence of fracture, dislocation, or joint effusion. There is no evidence of arthropathy or other focal bone abnormality. Soft tissues are unremarkable. IMPRESSION: Negative.  Electronically Signed   By: Norva PavlovElizabeth  Brown M.D.   On: 12/16/2015 15:06   Ct Head Wo Contrast  Result Date: 12/16/2015 CLINICAL DATA:  Fall in bathroom at home.  Right scalp hematoma EXAM: CT HEAD WITHOUT CONTRAST CT CERVICAL SPINE WITHOUT CONTRAST TECHNIQUE: Multidetector CT imaging of the head and cervical spine was performed following the standard protocol without intravenous contrast. Multiplanar CT image reconstructions of the cervical spine were also generated. COMPARISON:  05/01/2015 FINDINGS: CT HEAD FINDINGS There is atrophy and chronic small vessel disease changes. No acute intracranial abnormality. Specifically, no hemorrhage, hydrocephalus, mass lesion, acute infarction, or significant intracranial injury. Mild soft tissue swelling in the right temporal region. Visualized paranasal sinuses and mastoids clear. Orbital soft tissues unremarkable. CT CERVICAL SPINE FINDINGS Normal alignment. Degenerative disc and facet disease throughout the cervical spine. Prevertebral soft tissues are normal. No fracture. No epidural or paraspinal hematoma. Airspace opacities noted in the apices bilaterally, right greater than left. This likely reflects scarring, but elective chest CT would be helpful to completely exclude mass, particularly in the right apex. IMPRESSION: No acute intracranial abnormality.Atrophy, chronic microvascular disease. Cervical spondylosis.  No acute bony abnormality. Densities in the apices bilaterally, likely scarring. However, elective chest CT would be helpful to completely exclude mass in the right upper lobe. Electronically Signed   By: Charlett NoseKevin  Dover M.D.   On: 12/16/2015 15:30   Ct Cervical Spine Wo Contrast  Result Date: 12/16/2015 CLINICAL DATA:  Fall in bathroom at home.  Right scalp hematoma EXAM: CT HEAD WITHOUT CONTRAST CT CERVICAL SPINE WITHOUT CONTRAST TECHNIQUE: Multidetector CT imaging of the head and cervical spine was  performed following the standard protocol without  intravenous contrast. Multiplanar CT image reconstructions of the cervical spine were also generated. COMPARISON:  05/01/2015 FINDINGS: CT HEAD FINDINGS There is atrophy and chronic small vessel disease changes. No acute intracranial abnormality. Specifically, no hemorrhage, hydrocephalus, mass lesion, acute infarction, or significant intracranial injury. Mild soft tissue swelling in the right temporal region. Visualized paranasal sinuses and mastoids clear. Orbital soft tissues unremarkable. CT CERVICAL SPINE FINDINGS Normal alignment. Degenerative disc and facet disease throughout the cervical spine. Prevertebral soft tissues are normal. No fracture. No epidural or paraspinal hematoma. Airspace opacities noted in the apices bilaterally, right greater than left. This likely reflects scarring, but elective chest CT would be helpful to completely exclude mass, particularly in the right apex. IMPRESSION: No acute intracranial abnormality.Atrophy, chronic microvascular disease. Cervical spondylosis.  No acute bony abnormality. Densities in the apices bilaterally, likely scarring. However, elective chest CT would be helpful to completely exclude mass in the right upper lobe. Electronically Signed   By: Charlett Nose M.D.   On: 12/16/2015 15:30   Dg Hip Unilat With Pelvis 2-3 Views Right  Result Date: 12/16/2015 CLINICAL DATA:  Fall.  Right hip pain. EXAM: DG HIP (WITH OR WITHOUT PELVIS) 2-3V RIGHT COMPARISON:  None. FINDINGS: Changes of prior left hip replacement. There is the comminuted right femoral intertrochanteric fracture with varus angulation. No subluxation or dislocation. IMPRESSION: Right intertrochanteric fracture with varus angulation. Electronically Signed   By: Charlett Nose M.D.   On: 12/16/2015 15:09   Dg Chest 2 View  Result Date: 12/16/2015 CLINICAL DATA:  Patient comes from home, states got up to the restroom and fell. Right leg is externally rotated. Pt having right hip pain and right elbow  pain, pt has small laceration to posterior elbow. Hx HTN, TB, CABG, COPD, afib, CHF, CAD EXAM: CHEST  2 VIEW COMPARISON:  07/10/2015 FINDINGS: Status post median sternotomy and CABG. The heart is normal in size. There are bilateral pleural effusions and bibasilar opacities consistent atelectasis or early infiltrates. There is no pneumothorax. Patient is kyphotic with wedge compression fractures at numerous thoracic levels, likely chronic. IMPRESSION: 1. Bilateral pleural effusions and bibasilar atelectasis or early infiltrates. 2. No acute fracture. Electronically Signed   By: Norva Pavlov M.D.   On: 12/16/2015 15:13   Dg Elbow Complete Right  Result Date: 12/16/2015 CLINICAL DATA:  Patient comes from home, states got up to the restroom and fell. Right leg is externally rotated. Pt having right hip pain and right elbow pain, pt has small laceration to posterior elbow. Hx HTN, TB, CABG, COPD, afib, CHF, CAD EXAM: RIGHT ELBOW - COMPLETE 3+ VIEW COMPARISON:  None. FINDINGS: There is no evidence of fracture, dislocation, or joint effusion. There is no evidence of arthropathy or other focal bone abnormality. Soft tissues are unremarkable. IMPRESSION: Negative. Electronically Signed   By: Norva Pavlov M.D.   On: 12/16/2015 15:06   Ct Head Wo Contrast  Result Date: 12/16/2015 CLINICAL DATA:  Fall in bathroom at home.  Right scalp hematoma EXAM: CT HEAD WITHOUT CONTRAST CT CERVICAL SPINE WITHOUT CONTRAST TECHNIQUE: Multidetector CT imaging of the head and cervical spine was performed following the standard protocol without intravenous contrast. Multiplanar CT image reconstructions of the cervical spine were also generated. COMPARISON:  05/01/2015 FINDINGS: CT HEAD FINDINGS There is atrophy and chronic small vessel disease changes. No acute intracranial abnormality. Specifically, no hemorrhage, hydrocephalus, mass lesion, acute infarction, or significant intracranial injury. Mild soft tissue swelling in the  right temporal region. Visualized paranasal sinuses and mastoids clear. Orbital soft tissues unremarkable. CT CERVICAL SPINE FINDINGS Normal alignment. Degenerative disc and facet disease throughout the cervical spine. Prevertebral soft tissues are normal. No fracture. No epidural or paraspinal hematoma. Airspace opacities noted in the apices bilaterally, right greater than left. This likely reflects scarring, but elective chest CT would be helpful to completely exclude mass, particularly in the right apex. IMPRESSION: No acute intracranial abnormality.Atrophy, chronic microvascular disease. Cervical spondylosis.  No acute bony abnormality. Densities in the apices bilaterally, likely scarring. However, elective chest CT would be helpful to completely exclude mass in the right upper lobe. Electronically Signed   By: Charlett Nose M.D.   On: 12/16/2015 15:30   Ct Cervical Spine Wo Contrast  Result Date: 12/16/2015 CLINICAL DATA:  Fall in bathroom at home.  Right scalp hematoma EXAM: CT HEAD WITHOUT CONTRAST CT CERVICAL SPINE WITHOUT CONTRAST TECHNIQUE: Multidetector CT imaging of the head and cervical spine was performed following the standard protocol without intravenous contrast. Multiplanar CT image reconstructions of the cervical spine were also generated. COMPARISON:  05/01/2015 FINDINGS: CT HEAD FINDINGS There is atrophy and chronic small vessel disease changes. No acute intracranial abnormality. Specifically, no hemorrhage, hydrocephalus, mass lesion, acute infarction, or significant intracranial injury. Mild soft tissue swelling in the right temporal region. Visualized paranasal sinuses and mastoids clear. Orbital soft tissues unremarkable. CT CERVICAL SPINE FINDINGS Normal alignment. Degenerative disc and facet disease throughout the cervical spine. Prevertebral soft tissues are normal. No fracture. No epidural or paraspinal hematoma. Airspace opacities noted in the apices bilaterally, right greater than  left. This likely reflects scarring, but elective chest CT would be helpful to completely exclude mass, particularly in the right apex. IMPRESSION: No acute intracranial abnormality.Atrophy, chronic microvascular disease. Cervical spondylosis.  No acute bony abnormality. Densities in the apices bilaterally, likely scarring. However, elective chest CT would be helpful to completely exclude mass in the right upper lobe. Electronically Signed   By: Charlett Nose M.D.   On: 12/16/2015 15:30   Dg Hip Unilat With Pelvis 2-3 Views Right  Result Date: 12/16/2015 CLINICAL DATA:  Fall.  Right hip pain. EXAM: DG HIP (WITH OR WITHOUT PELVIS) 2-3V RIGHT COMPARISON:  None. FINDINGS: Changes of prior left hip replacement. There is the comminuted right femoral intertrochanteric fracture with varus angulation. No subluxation or dislocation. IMPRESSION: Right intertrochanteric fracture with varus angulation. Electronically Signed   By: Charlett Nose M.D.   On: 12/16/2015 15:09       EKG Interpretation  Date/Time:                                      Monday December 16 2015 15:20:42 EDT Ventricular Rate:             77 PR Interval:                                            QRS Duration:                  101 QT Interval:  363 QTC Calculation:              411 R Axis:                                                       -61 Text Interpretation:            Atrial fibrillation Left axis deviation Left anterior fascicular block No significant change     Assessment/Plan Right intertrochanteric fracture with varus angulation Patient also has bruising of right shin area with possible fracture there, have ordered x-rays of the right shin, CT of the head negative for intracranial bleed Dr Dion Saucier has been consulted, he has requested preoperative clearance, Obtain CT chest abdomen pelvis to evaluate the size of the patient's aneurysm, preop echo, cardiology consultation  requested Lovenox for DVT prophylaxis    Hypertension-Continue home meds    Aneurysm, aortic (HCC)-CT of the chest to evaluate size of her intrathoracic as well as distal aortic aneurysm      Carotid artery disease (HCC)-Hx of endarterectomy. Doppler October, 2011, stable, 0-39% RIC A., 40-59% LICA    Atrial fibrillation (HCC)-Rate controlled on digoxin and Cardizem, check digoxin level    Insulin dependent type 2 diabetes mellitus (HCC)-Continue Lantus and sliding scale insulin, check hemoglobin A1c   COPD exacerbation - on exam patient has diffuse wheezing. Continue outpatient nebulizers, appears to be chronically on prednisone. Will start patient on stress dose steroids  Diastolic CHF - appears compensated continue Lasix.    Chronic anemia - follow CBC   DVT prophylaxis:  Lovenox     Code Status Orders full code       Family Communication: discussed with patient . By the bedside   Disposition Plan:  Anticipate discharge in 2-3 days pending further workup and clinical progress  Consults called: Cardiology, orthopedics   Admission status: Inpatient     time spent 55 minutes.Greater than 50% of this time was spent in counseling, explanation of diagnosis, planning of further management, and coordination of care  [facility] MD [facility] Pager [phone]  If 7PM-7AM, please contact night-coverage [email or web address] Password [facility]  12/16/2015, 5:49 PM

## 2015-12-16 NOTE — ED Notes (Signed)
Attempted report x1. 

## 2015-12-16 NOTE — Consult Note (Signed)
Admit date: 12/16/2015 Referring Physician  Dr. Susie [name] Primary Physician [name]., MD Primary Cardiologist  Dr. Delton [name] Reason for Consultation  preop cardiovascular evaluation  HPI: 82 year old female with history of coronary artery disease status post CABG in 2005, hypertension, both ascending and descending aortic aneurysm, steroid and oxygen dependent COPD with severe airway obstruction, paroxysmal atrial fibrillation not on Coumadin because of poor candidacy, diabetic, peripheral vascular disease/carotid artery disease on fortunately came to the [facility] emergency department after a fall. She has a right intratrochanteric fracture. Orthopedic surgery, Dr. Osie [name] has seen her and requested a consultation given her vast cardiovascular history.  Echocardiogram on 10/2014 demonstrated normal ejection fraction of 60% with mild pulmonary hypertension, 35 mmHg.  Cardiac catheterization in 2014 resulted in bare-metal stent to the proximal SVG to diagonal graft. Her other grafts are LIMA to LAD, SVG to obtuse marginal, SVG to PDA.   In review of last office note from Dr. Delton [name] on 10/16/15 her shortness of breath had been fairly stable and she was not complaining of any chest pain/angina. She has had episodes of tachycardia/RVR in the past and had needed her diltiazem/Cardizem increased to 240 mg a day. She is only on aspirin because she has a history of GI bleed. We are also using very low-dose digoxin to help her with her heart rate control.   PMH:   Past Medical History:  Diagnosis Date  . AAA (abdominal aortic aneurysm) (HCC)   . Aneurysm, aortic (HCC)    thoracic aorta, stable at 4.1 cm, chest CT, May, 2012  . Bradycardia   . CAD (coronary artery disease)    a. s/p CABG x5 (2005) LIMA->LAD, rSVG->diag, seq r SVG -> 1st OM and distal LCX, rSVG->PDA b. s/p BMS-prox SVG-D2 (2014)  . Carotid artery disease (HCC)    Hx of endarterectomy. Doppler October, 2011, stable, 0-39% RIC A.,  40-59% LICA  . CHF with left ventricular diastolic dysfunction, NYHA class 2 (HCC)    a. EF 55-60%, echo, April, 2013 b. EF 55-60%, mild biatrial enlargement and PASP 42 mmH  . Chronic atrial fibrillation (HCC)    Not felt to be an anticoagulation candidate secondary to hx of GIB/AVM  . Complication of anesthesia   . COPD (chronic obstructive pulmonary disease) (HCC)    on home oxygen  . GI bleed 2008   AVMs  . Hematoma    Right groin hematoma, small, post cath, April, 2014  . Hx of CABG    CABG 2005  . Hyperlipidemia   . Hypertension   . Hyponatremia    Chronic. Felt secondary to SIADH   . Leg ulcer (HCC)    Ulcerated lesion on the anterior aspect of her left lower leg, June, 2014  . Mitral regurgitation    Mild, hospital, March, 2014  . Syncope    Nitroglycerin plus a diuretic, April, 2009  . Tobacco abuse   . Tuberculosis    Exposures to tuberculosis 1970s, has tested negative by the health Department  . Type 2 diabetes mellitus (HCC)   . Venous stasis of lower extremity    Chronic    PSH:   Past Surgical History:  Procedure Laterality Date  . ABDOMINAL HYSTERECTOMY    . carotid endartercetomy    . CHOLECYSTECTOMY    . CORONARY ANGIOPLASTY WITH STENT PLACEMENT  07/18/12   severe native coronary artery disease, 99% proximal stenosis of the SVG-D2 status post successful PTCA/PCI with a Veriflex bare-metal stent, widely patent SVGs to  both the right PDA sequential OM1 to OM 2, EF 65-70% and 3+ mitral regurgitation  . CORONARY ARTERY BYPASS GRAFT  2005  . ERCP N/A 11/20/2014   Procedure: ENDOSCOPIC RETROGRADE CHOLANGIOPANCREATOGRAPHY (ERCP);  Surgeon: Karen DareMalcolm T Stark, MD;  Location[facility] ENDOSCOPY;  Service: Endoscopy;  Laterality: N/A;  . LEFT HEART CATHETERIZATION WITH CORONARY ANGIOGRAM N/A 07/18/2012   Procedure: LEFT HEART CATHETERIZATION WITH CORONARY ANGIOGRAM;  Surgeon: Karen Lexavid W Harding, MD;  [facility];  Service: Cardiovascular;  Laterality: N/A;  .  PERCUTANEOUS CORONARY STENT INTERVENTION (PCI-S)  07/18/2012   Procedure: PERCUTANEOUS CORONARY STENT INTERVENTION (PCI-S);  Surgeon: Karen Lexavid W Harding, MD;  [facility];  Service: Cardiovascular;;   Allergies:  Other; Codeine; Sulfonamide derivatives; Warfarin; Penicillins; and Ranitidine hcl Prior to Admit Meds:   Prior to Admission medications   Medication Sig Start Date End Date Taking? Authorizing Provider  albuterol (PROVENTIL HFA;VENTOLIN HFA) 108 (90 BASE) MCG/ACT inhaler Inhale 2 puffs into the lungs every 4 (four) hours as needed for wheezing or shortness of breath. 07/16/14  Yes Karen S Parrett, NP  albuterol (PROVENTIL) (2.5 MG/3ML) 0.083% nebulizer solution Take 2.5 mg by nebulization every 6 (six) hours as needed for wheezing.   Yes Historical Provider, MD  ALPRAZolam (XANAX) 0.25 MG tablet Take 1 tablet (0.25 mg total) by mouth at bedtime as needed for anxiety. 11/23/14  Yes Karen SeaPrashant K Singh, MD  aspirin EC 81 MG tablet Take 81 mg by mouth daily.   Yes Historical Provider, MD  atorvastatin (LIPITOR) 40 MG tablet Take 0.5 tablets (20 mg total) by mouth daily at 6 PM. 11/07/13  Yes Karen MacadamiaLori C Gerhardt, NP  diazepam (VALIUM) 2 MG tablet Take 2 mg by mouth every 6 (six) hours as needed for anxiety.   Yes Historical Provider, MD  Digoxin 62.5 MCG TABS Take 0.0625 mg by mouth daily. 10/22/15  Yes Karen MassonKatarina H Nelson, MD  diltiazem (DILACOR XR) 120 MG 24 hr capsule Take 1 capsule (120 mg total) by mouth daily. 10/16/15  Yes Karen MassonKatarina H Nelson, MD  feeding supplement (BOOST / RESOURCE BREEZE) LIQD Take 1 Container by mouth 2 (two) times daily as needed (for supplementatioin).    Yes Historical Provider, MD  furosemide (LASIX) 40 MG tablet Take 1 tablet (40 mg total) by mouth daily as needed for fluid or edema. 10/16/15  Yes Karen MassonKatarina H Nelson, MD  insulin aspart (NOVOLOG) 100 UNIT/ML injection Before each meal 3 times a day, 140-199 - 2 units, 200-250 - 4 units, 251-299 - 6 units,  300-349 - 8  units,  350 or above 10 units. Dispense syringes and needles as needed, Ok to switch to PEN if approved. Substitute to any brand approved. DX DM2, Code E11.65 11/23/14  Yes Karen SeaPrashant K Singh, MD  insulin glargine (LANTUS) 100 UNIT/ML injection Inject 0.1 mLs (10 Units total) into the skin at bedtime. 03/26/15  Yes Palma HolterKanishka G Gunadasa, MD  magnesium oxide (MAG-OX) 400 MG tablet Take 400 mg by mouth daily.   Yes Historical Provider, MD  nitroGLYCERIN (NITROSTAT) 0.4 MG SL tablet Place 0.4 mg under the tongue every 5 (five) minutes as needed for chest pain.   Yes Historical Provider, MD  ondansetron (ZOFRAN) 8 MG tablet Take 1 tablet (8 mg total) by mouth every 8 (eight) hours as needed for nausea or vomiting. 10/28/14  Yes Mancel BaleElliott Wentz, MD  OXYGEN Inhale 2-2.5 L into the lungs continuous. 2L continously   Yes Historical Provider, MD  pantoprazole (PROTONIX) 40 MG  tablet Take 1 tablet (40 mg total) by mouth daily. 12/26/14  Yes Leone BrandLaura R Ingold, NP  polyethylene glycol (MIRALAX / GLYCOLAX) packet Take 17 g by mouth daily as needed for mild constipation or moderate constipation. Reported on 07/10/2015   Yes Historical Provider, MD  potassium chloride SA (K-DUR,KLOR-CON) 20 MEQ tablet Take 1 tablet (20 mEq total) by mouth daily. 03/28/15  Yes Palma HolterKanishka G Gunadasa, MD  predniSONE (DELTASONE) 20 MG tablet Take 60 mg by mouth daily with breakfast.   Yes Historical Provider, MD  senna (SENOKOT) 8.6 MG TABS tablet Take 8.6 mg by mouth daily as needed for mild constipation or moderate constipation.    Yes Historical Provider, MD  SYMBICORT 160-4.5 MCG/ACT inhaler USE 2 INHALATIONS TWICE A DAY 11/19/14  Yes Oretha Milchakesh V Alva, MD  Tiotropium Bromide Monohydrate (SPIRIVA RESPIMAT) 2.5 MCG/ACT AERS Inhale 2 puffs into the lungs daily.   Yes Historical Provider, MD  Amino Acids-Protein Hydrolys (FEEDING SUPPLEMENT, PRO-STAT SUGAR FREE 64,) LIQD Take 30 mLs by mouth daily. 03/26/15   Palma HolterKanishka G Gunadasa, MD  feeding supplement,  ENSURE ENLIVE, (ENSURE ENLIVE) LIQD Take 237 mLs by mouth daily. Patient not taking: Reported on 12/16/2015 03/26/15   Palma HolterKanishka G Gunadasa, MD  SPIRIVA HANDIHALER 18 MCG inhalation capsule INHALE THE CONTENTS OF 1 CAPSULE ONCE DAILY Patient not taking: Reported on 12/16/2015 06/03/15   Oretha Milchakesh V Alva, MD   Fam HX:    Family History  Problem Relation Age of Onset  . Congestive Heart Failure Mother   . Lung cancer Father   . Stroke Sister   . Breast cancer Sister   . Breast cancer Brother   . Lung cancer Sister   . Prostate cancer Brother   . Heart attack Neg Hx   . Hypertension Neg Hx    Social HX:    Social History   Social History  . Marital status: Widowed    Spouse name: N/A  . Number of children: 1  . Years of education: N/A   Occupational History  . retired     [employer]   Social History Main Topics  . Smoking status: Former Smoker    Packs/day: 0.50    Years: 59.00    Types: Cigarettes    Quit date: 02/01/2013  . Smokeless tobacco: Not on file  . Alcohol use No  . Drug use: No  . Sexual activity: Not on file   Other Topics Concern  . Not on file   Social History Narrative  . No narrative on file     ROS:  All 11 ROS were addressed and are negative except what is stated in the HPI   Physical Exam: Blood pressure 131/77, pulse 89, temperature 97.8 F (36.6 C), resp. rate 17, height 6' (1.829 m), weight 170 lb (77.1 kg), SpO2 96 %.   General: Frail appearing, in no acute distress Head: Eyes PERRLA, No xanthomas.   Normal cephalic and atramatic  Lungs:   Poor air movement, scattered wheeze.  Heart:   HRRR S1 S2 Pulses are 2+ & equal. No murmur, rubs, gallops.  No carotid bruit. No JVD.  No abdominal bruits.  Abdomen: Bowel sounds are positive, abdomen soft and non-tender without masses. No hepatosplenomegaly. Msk:  Back normal. Normal strength and tone for age. Extremities:  Abrasion, right leg deformity.  Neuro: Alert and oriented X 3, non-focal, MAE x 4 GU:  Deferred Rectal: Deferred Psych:  Good affect, responds appropriately      Labs: Lab Results  Component Value Date   WBC 14.3 (H) 12/16/2015   HGB 10.7 (L) 12/16/2015   HCT 34.9 (L) 12/16/2015   MCV 87.9 12/16/2015   PLT 202 12/16/2015     Recent Labs Lab 12/16/15 1548  NA 137  K 4.1  CL 105  CO2 25  BUN 23*  CREATININE 0.85  CALCIUM 9.9  GLUCOSE 189*   No results for input(s): CKTOTAL, CKMB, TROPONINI in the last 72 hours. Lab Results  Component Value Date   CHOL 134 04/17/2015   HDL 56 04/17/2015   LDLCALC 53 04/17/2015   TRIG 127 04/17/2015   Lab Results  Component Value Date   DDIMER  01/23/2007    0.29        AT THE INHOUSE ESTABLISHED CUTOFF VALUE OF 0.48 ug/mL FEU, THIS ASSAY HAS BEEN DOCUMENTED IN THE LITERATURE TO HAVE     Radiology:  Dg Chest 2 View  Result Date: 12/16/2015 CLINICAL DATA:  Patient comes from home, states got up to the restroom and fell. Right leg is externally rotated. Pt having right hip pain and right elbow pain, pt has small laceration to posterior elbow. Hx HTN, TB, CABG, COPD, afib, CHF, CAD EXAM: CHEST  2 VIEW COMPARISON:  07/10/2015 FINDINGS: Status post median sternotomy and CABG. The heart is normal in size. There are bilateral pleural effusions and bibasilar opacities consistent atelectasis or early infiltrates. There is no pneumothorax. Patient is kyphotic with wedge compression fractures at numerous thoracic levels, likely chronic. IMPRESSION: 1. Bilateral pleural effusions and bibasilar atelectasis or early infiltrates. 2. No acute fracture. Electronically Signed   By: Norva Pavlov M.D.   On: 12/16/2015 15:13   Dg Elbow Complete Right  Result Date: 12/16/2015 CLINICAL DATA:  Patient comes from home, states got up to the restroom and fell. Right leg is externally rotated. Pt having right hip pain and right elbow pain, pt has small laceration to posterior elbow. Hx HTN, TB, CABG, COPD, afib, CHF, CAD EXAM: RIGHT ELBOW -  COMPLETE 3+ VIEW COMPARISON:  None. FINDINGS: There is no evidence of fracture, dislocation, or joint effusion. There is no evidence of arthropathy or other focal bone abnormality. Soft tissues are unremarkable. IMPRESSION: Negative. Electronically Signed   By: Norva Pavlov M.D.   On: 12/16/2015 15:06   Ct Head Wo Contrast  Result Date: 12/16/2015 CLINICAL DATA:  Fall in bathroom at home.  Right scalp hematoma EXAM: CT HEAD WITHOUT CONTRAST CT CERVICAL SPINE WITHOUT CONTRAST TECHNIQUE: Multidetector CT imaging of the head and cervical spine was performed following the standard protocol without intravenous contrast. Multiplanar CT image reconstructions of the cervical spine were also generated. COMPARISON:  05/01/2015 FINDINGS: CT HEAD FINDINGS There is atrophy and chronic small vessel disease changes. No acute intracranial abnormality. Specifically, no hemorrhage, hydrocephalus, mass lesion, acute infarction, or significant intracranial injury. Mild soft tissue swelling in the right temporal region. Visualized paranasal sinuses and mastoids clear. Orbital soft tissues unremarkable. CT CERVICAL SPINE FINDINGS Normal alignment. Degenerative disc and facet disease throughout the cervical spine. Prevertebral soft tissues are normal. No fracture. No epidural or paraspinal hematoma. Airspace opacities noted in the apices bilaterally, right greater than left. This likely reflects scarring, but elective chest CT would be helpful to completely exclude mass, particularly in the right apex. IMPRESSION: No acute intracranial abnormality.Atrophy, chronic microvascular disease. Cervical spondylosis.  No acute bony abnormality. Densities in the apices bilaterally, likely scarring. However, elective chest CT would be helpful to completely exclude mass in the right  upper lobe. Electronically Signed   By: Charlett Nose M.D.   On: 12/16/2015 15:30   Ct Cervical Spine Wo Contrast  Result Date: 12/16/2015 CLINICAL DATA:  Fall  in bathroom at home.  Right scalp hematoma EXAM: CT HEAD WITHOUT CONTRAST CT CERVICAL SPINE WITHOUT CONTRAST TECHNIQUE: Multidetector CT imaging of the head and cervical spine was performed following the standard protocol without intravenous contrast. Multiplanar CT image reconstructions of the cervical spine were also generated. COMPARISON:  05/01/2015 FINDINGS: CT HEAD FINDINGS There is atrophy and chronic small vessel disease changes. No acute intracranial abnormality. Specifically, no hemorrhage, hydrocephalus, mass lesion, acute infarction, or significant intracranial injury. Mild soft tissue swelling in the right temporal region. Visualized paranasal sinuses and mastoids clear. Orbital soft tissues unremarkable. CT CERVICAL SPINE FINDINGS Normal alignment. Degenerative disc and facet disease throughout the cervical spine. Prevertebral soft tissues are normal. No fracture. No epidural or paraspinal hematoma. Airspace opacities noted in the apices bilaterally, right greater than left. This likely reflects scarring, but elective chest CT would be helpful to completely exclude mass, particularly in the right apex. IMPRESSION: No acute intracranial abnormality.Atrophy, chronic microvascular disease. Cervical spondylosis.  No acute bony abnormality. Densities in the apices bilaterally, likely scarring. However, elective chest CT would be helpful to completely exclude mass in the right upper lobe. Electronically Signed   By: Charlett Nose M.D.   On: 12/16/2015 15:30   Dg Hip Unilat With Pelvis 2-3 Views Right  Result Date: 12/16/2015 CLINICAL DATA:  Fall.  Right hip pain. EXAM: DG HIP (WITH OR WITHOUT PELVIS) 2-3V RIGHT COMPARISON:  None. FINDINGS: Changes of prior left hip replacement. There is the comminuted right femoral intertrochanteric fracture with varus angulation. No subluxation or dislocation. IMPRESSION: Right intertrochanteric fracture with varus angulation. Electronically Signed   By: Charlett Nose  M.D.   On: 12/16/2015 15:09   Personally viewed.  EKG:  12/16/15-atrial fibrillation heart rate 77 bpm, left anterior fascicular block, poor R-wave progression-no ischemic changes. Personally viewed.  ECHO 11/19/14: - Left ventricle: The cavity size was normal. Wall thickness was  increased in a pattern of mild LVH. Systolic function was normal.   The estimated ejection fraction was in the range of 55% to 60%.   Regional wall motion abnormalities cannot be excluded. - Mitral valve: There was mild regurgitation. - Left atrium: The atrium was moderately dilated. - Right atrium: The atrium was mildly dilated. - Pulmonary arteries: PA peak pressure: 35 mm Hg (S).   ASSESSMENT/PLAN:    Preoperative cardiovascular examination  - Based upon her extensive coronary artery disease history, prior bypass and most recent stent placement SVG to diagonal, she is of moderate risk from a cardiovascular perspective for orthopedic surgery/general anesthesia. I would be more concerned about her pulmonary risk given her severe oxygen dependent COPD with resting oxygen saturation in the 80s.  - She may proceed with surgery with caution. Without intramedullary nail, she would likely be fairly disabled and without movement, her mortality would increase dramatically.  Severe COPD  - At risk for prolonged ventilatory status.  Chronic diastolic heart failure  - Does not appear to be volume overloaded.  - Be careful with fluid administration. Lasix as needed.  Atrial fibrillation  - Has had bouts of rapid ventricular response.  - Digoxin level was 1.6 in January 2017.  - No anticoagulation because of GI bleed.  - Diltiazem, continue.   Donato Schultz, MD  12/16/2015  7:07 PM

## 2015-12-16 NOTE — ED Notes (Signed)
Pt back from CT

## 2015-12-16 NOTE — Progress Notes (Signed)
Reviewed films, called by ED.  Right intertroch fracture, complex.  Will require IM nailing.  Plan surgery tomorrow if optimized, high risk based on medical history.   Full consult to follow.   Eulas PostLANDAU,Shazia Mitchener P, MD

## 2015-12-17 ENCOUNTER — Inpatient Hospital Stay (HOSPITAL_COMMUNITY): Payer: Medicare Other | Admitting: Certified Registered Nurse Anesthetist

## 2015-12-17 ENCOUNTER — Inpatient Hospital Stay (HOSPITAL_COMMUNITY): Payer: Medicare Other

## 2015-12-17 ENCOUNTER — Other Ambulatory Visit (HOSPITAL_COMMUNITY): Payer: Medicare Other

## 2015-12-17 ENCOUNTER — Encounter (HOSPITAL_COMMUNITY)
Admission: EM | Disposition: A | Discharge status: Skilled Nursing Facility | Payer: Self-pay | Source: Home / Self Care | Attending: Internal Medicine

## 2015-12-17 ENCOUNTER — Encounter (HOSPITAL_COMMUNITY): Payer: Self-pay | Admitting: Orthopedic Surgery

## 2015-12-17 DIAGNOSIS — L8990 Pressure ulcer of unspecified site, unspecified stage: Secondary | ICD-10-CM | POA: Insufficient documentation

## 2015-12-17 HISTORY — PX: FEMUR IM NAIL: SHX1597

## 2015-12-17 LAB — GLUCOSE, CAPILLARY
GLUCOSE-CAPILLARY: 118 mg/dL — AB (ref 65–99)
GLUCOSE-CAPILLARY: 113 mg/dL — AB (ref 65–99)
GLUCOSE-CAPILLARY: 165 mg/dL — AB (ref 65–99)
Glucose-Capillary: 136 mg/dL — ABNORMAL HIGH (ref 65–99)
Glucose-Capillary: 117 mg/dL — ABNORMAL HIGH (ref 65–99)

## 2015-12-17 LAB — PROTIME-INR
INR: 1.02
PROTHROMBIN TIME: 13.4 s (ref 11.4–15.2)

## 2015-12-17 LAB — URINALYSIS, ROUTINE W REFLEX MICROSCOPIC
BILIRUBIN URINE: NEGATIVE
GLUCOSE, UA: 100 mg/dL — AB
Ketones, ur: NEGATIVE mg/dL
Nitrite: NEGATIVE
PROTEIN: NEGATIVE mg/dL
Specific Gravity, Urine: 1.042 — ABNORMAL HIGH (ref 1.005–1.030)
pH: 7 (ref 5.0–8.0)

## 2015-12-17 LAB — BASIC METABOLIC PANEL
ANION GAP: 6 (ref 5–15)
BUN: 23 mg/dL — AB (ref 6–20)
CO2: 29 mmol/L (ref 22–32)
Calcium: 9.9 mg/dL (ref 8.9–10.3)
Chloride: 101 mmol/L (ref 101–111)
Creatinine, Ser: 0.88 mg/dL (ref 0.44–1.00)
GFR calc Af Amer: >60 mL/min (ref >60)
GFR calc non Af Amer: 59 mL/min — ABNORMAL LOW (ref >60)
GLUCOSE: 144 mg/dL — AB (ref 65–99)
POTASSIUM: 4.4 mmol/L (ref 3.5–5.1)
Sodium: 136 mmol/L (ref 135–145)

## 2015-12-17 LAB — HEMOGLOBIN A1C
Hgb A1c MFr Bld: 5 % (ref 4.8–5.6)
Mean Plasma Glucose: 97 mg/dL

## 2015-12-17 LAB — SURGICAL PCR SCREEN
MRSA, PCR: NEGATIVE
STAPHYLOCOCCUS AUREUS: NEGATIVE

## 2015-12-17 LAB — CBC
HEMATOCRIT: 31.8 % — AB (ref 36.0–46.0)
Hemoglobin: 9.8 g/dL — ABNORMAL LOW (ref 12.0–15.0)
MCH: 27 pg (ref 26.0–34.0)
MCHC: 30.8 g/dL (ref 30.0–36.0)
MCV: 87.6 fL (ref 78.0–100.0)
PLATELETS: 205 10*3/uL (ref 150–400)
RBC: 3.63 MIL/uL — AB (ref 3.87–5.11)
RDW: 14.4 % (ref 11.5–15.5)
WBC: 11.8 10*3/uL — AB (ref 4.0–10.5)

## 2015-12-17 LAB — TSH: TSH: 1.32 u[IU]/mL (ref 0.350–4.500)

## 2015-12-17 LAB — VITAMIN D 25 HYDROXY (VIT D DEFICIENCY, FRACTURES): Vit D, 25-Hydroxy: 27.1 ng/mL — ABNORMAL LOW (ref 30.0–100.0)

## 2015-12-17 LAB — TROPONIN I

## 2015-12-17 LAB — APTT: aPTT: 29 seconds (ref 24–36)

## 2015-12-17 LAB — URINE MICROSCOPIC-ADD ON

## 2015-12-17 SURGERY — INSERTION, INTRAMEDULLARY ROD, FEMUR
Anesthesia: Monitor Anesthesia Care | Laterality: Right

## 2015-12-17 MED ORDER — DEXMEDETOMIDINE HCL IN NACL 200 MCG/50ML IV SOLN
INTRAVENOUS | Status: DC | PRN
  Administered 2015-12-17: .5 ug/kg/h via INTRAVENOUS

## 2015-12-17 MED ORDER — ACETAMINOPHEN 650 MG RE SUPP: 650.0000 mg | Freq: Four times a day (QID) | RECTAL | Status: DC | PRN

## 2015-12-17 MED ORDER — ONDANSETRON HCL 4 MG/2ML IJ SOLN
4.0000 mg | Freq: Four times a day (QID) | INTRAMUSCULAR | Status: DC | PRN
  Administered 2015-12-17 – 2015-12-18 (×2): 4 mg via INTRAVENOUS
  Filled 2015-12-17 (×2): qty 2

## 2015-12-17 MED ORDER — KETAMINE HCL 100 MG/ML IJ SOLN
INTRAMUSCULAR | Status: AC
  Filled 2015-12-17: qty 1

## 2015-12-17 MED ORDER — NITROGLYCERIN 0.4 MG SL SUBL: 0.4000 mg | SUBLINGUAL_TABLET | SUBLINGUAL | Status: DC | PRN

## 2015-12-17 MED ORDER — MAGNESIUM CITRATE PO SOLN: 1.0000 | Freq: Once | ORAL | Status: DC | PRN

## 2015-12-17 MED ORDER — ACETAMINOPHEN 325 MG PO TABS
650.0000 mg | ORAL_TABLET | Freq: Four times a day (QID) | ORAL | Status: DC | PRN
  Administered 2015-12-20: 650 mg via ORAL
  Filled 2015-12-17: qty 2

## 2015-12-17 MED ORDER — FENTANYL CITRATE (PF) 100 MCG/2ML IJ SOLN
INTRAMUSCULAR | Status: AC
  Filled 2015-12-17: qty 2

## 2015-12-17 MED ORDER — PROPOFOL 500 MG/50ML IV EMUL
INTRAVENOUS | Status: DC | PRN
  Administered 2015-12-17: 10 ug/kg/min via INTRAVENOUS

## 2015-12-17 MED ORDER — PROPOFOL 1000 MG/100ML IV EMUL
INTRAVENOUS | Status: AC
  Filled 2015-12-17: qty 100

## 2015-12-17 MED ORDER — 0.9 % SODIUM CHLORIDE (POUR BTL) OPTIME
TOPICAL | Status: DC | PRN
  Administered 2015-12-17: 1000 mL

## 2015-12-17 MED ORDER — MORPHINE SULFATE (PF) 2 MG/ML IV SOLN
2.0000 mg | INTRAVENOUS | Status: DC | PRN
  Administered 2015-12-17 – 2015-12-18 (×2): 2 mg via INTRAVENOUS
  Filled 2015-12-17 (×2): qty 1

## 2015-12-17 MED ORDER — BUPIVACAINE HCL (PF) 0.75 % IJ SOLN
INTRAMUSCULAR | Status: DC | PRN
  Administered 2015-12-17: 12 mg via INTRATHECAL

## 2015-12-17 MED ORDER — PRO-STAT SUGAR FREE PO LIQD
30.0000 mL | Freq: Every day | ORAL | Status: DC
  Administered 2015-12-17 – 2015-12-21 (×5): 30 mL via ORAL
  Filled 2015-12-17 (×5): qty 30

## 2015-12-17 MED ORDER — HYDROCODONE-ACETAMINOPHEN 5-325 MG PO TABS
1.0000 | ORAL_TABLET | Freq: Four times a day (QID) | ORAL | Status: DC | PRN
  Administered 2015-12-17 – 2015-12-19 (×2): 2 via ORAL
  Administered 2015-12-20 – 2015-12-21 (×2): 1 via ORAL
  Filled 2015-12-17: qty 1
  Filled 2015-12-17 (×2): qty 2
  Filled 2015-12-17: qty 1

## 2015-12-17 MED ORDER — SENNA-DOCUSATE SODIUM 8.6-50 MG PO TABS: 2.0000 | ORAL_TABLET | Freq: Every day | ORAL | 1 refills | Status: DC

## 2015-12-17 MED ORDER — POLYETHYLENE GLYCOL 3350 17 G PO PACK: 17.0000 g | PACK | Freq: Every day | ORAL | Status: DC | PRN

## 2015-12-17 MED ORDER — ENSURE ENLIVE PO LIQD
237.0000 mL | ORAL | Status: DC
  Administered 2015-12-17 – 2015-12-20 (×3): 237 mL via ORAL

## 2015-12-17 MED ORDER — MENTHOL 3 MG MT LOZG
1.0000 | LOZENGE | OROMUCOSAL | Status: DC | PRN
  Filled 2015-12-17: qty 9

## 2015-12-17 MED ORDER — HYDROCODONE-ACETAMINOPHEN 5-325 MG PO TABS: 1.0000 | ORAL_TABLET | Freq: Four times a day (QID) | ORAL | 0 refills | Status: AC | PRN

## 2015-12-17 MED ORDER — BISACODYL 10 MG RE SUPP: 10.0000 mg | Freq: Every day | RECTAL | Status: DC | PRN

## 2015-12-17 MED ORDER — ENOXAPARIN SODIUM 40 MG/0.4ML ~~LOC~~ SOLN
40.0000 mg | SUBCUTANEOUS | Status: DC
  Administered 2015-12-18 – 2015-12-21 (×4): 40 mg via SUBCUTANEOUS
  Filled 2015-12-17 (×4): qty 0.4

## 2015-12-17 MED ORDER — FENTANYL CITRATE (PF) 100 MCG/2ML IJ SOLN
INTRAMUSCULAR | Status: AC
  Filled 2015-12-17: qty 4

## 2015-12-17 MED ORDER — PHENYLEPHRINE HCL 10 MG/ML IJ SOLN
INTRAMUSCULAR | Status: DC | PRN
  Administered 2015-12-17 (×2): 120 ug via INTRAVENOUS

## 2015-12-17 MED ORDER — HYDROCORTISONE NA SUCCINATE PF 100 MG IJ SOLR
50.0000 mg | Freq: Three times a day (TID) | INTRAMUSCULAR | Status: DC
  Administered 2015-12-17 – 2015-12-19 (×6): 50 mg via INTRAVENOUS
  Filled 2015-12-17 (×6): qty 2

## 2015-12-17 MED ORDER — FENTANYL CITRATE (PF) 100 MCG/2ML IJ SOLN
INTRAMUSCULAR | Status: DC | PRN
  Administered 2015-12-17: 50 ug via INTRAVENOUS

## 2015-12-17 MED ORDER — BOOST / RESOURCE BREEZE PO LIQD: 1.0000 | Freq: Two times a day (BID) | ORAL | Status: DC | PRN

## 2015-12-17 MED ORDER — FENTANYL CITRATE (PF) 100 MCG/2ML IJ SOLN: 25.0000 ug | INTRAMUSCULAR | Status: DC | PRN

## 2015-12-17 MED ORDER — FERROUS SULFATE 325 (65 FE) MG PO TABS
325.0000 mg | ORAL_TABLET | Freq: Three times a day (TID) | ORAL | Status: DC
  Administered 2015-12-17 – 2015-12-21 (×12): 325 mg via ORAL
  Filled 2015-12-17 (×13): qty 1

## 2015-12-17 MED ORDER — ALBUTEROL SULFATE (2.5 MG/3ML) 0.083% IN NEBU: 2.5000 mg | INHALATION_SOLUTION | Freq: Four times a day (QID) | RESPIRATORY_TRACT | Status: DC | PRN

## 2015-12-17 MED ORDER — PHENYLEPHRINE HCL 10 MG/ML IJ SOLN
INTRAVENOUS | Status: DC | PRN
  Administered 2015-12-17: 10 ug/min via INTRAVENOUS

## 2015-12-17 MED ORDER — DOCUSATE SODIUM 100 MG PO CAPS
100.0000 mg | ORAL_CAPSULE | Freq: Two times a day (BID) | ORAL | Status: DC
  Administered 2015-12-17 – 2015-12-21 (×8): 100 mg via ORAL
  Filled 2015-12-17 (×10): qty 1

## 2015-12-17 MED ORDER — ALUM & MAG HYDROXIDE-SIMETH 200-200-20 MG/5ML PO SUSP: 30.0000 mL | ORAL | Status: DC | PRN

## 2015-12-17 MED ORDER — SENNA 8.6 MG PO TABS
1.0000 | ORAL_TABLET | Freq: Two times a day (BID) | ORAL | Status: DC
  Administered 2015-12-17 – 2015-12-21 (×8): 8.6 mg via ORAL
  Filled 2015-12-17 (×8): qty 1

## 2015-12-17 MED ORDER — LACTATED RINGERS IV SOLN
INTRAVENOUS | Status: DC
  Administered 2015-12-17 (×3): via INTRAVENOUS

## 2015-12-17 MED ORDER — CEFAZOLIN SODIUM-DEXTROSE 2-4 GM/100ML-% IV SOLN
2.0000 g | Freq: Four times a day (QID) | INTRAVENOUS | Status: AC
  Administered 2015-12-17 (×2): 2 g via INTRAVENOUS
  Filled 2015-12-17 (×2): qty 100

## 2015-12-17 MED ORDER — PHENOL 1.4 % MT LIQD
1.0000 | OROMUCOSAL | Status: DC | PRN
Start: 2015-12-17 — End: 2015-12-21

## 2015-12-17 MED ORDER — KETAMINE HCL 10 MG/ML IJ SOLN
INTRAMUSCULAR | Status: DC | PRN
  Administered 2015-12-17 (×3): 10 mg via INTRAVENOUS

## 2015-12-17 MED ORDER — PROPOFOL 10 MG/ML IV BOLUS
INTRAVENOUS | Status: AC
  Filled 2015-12-17: qty 20

## 2015-12-17 MED ORDER — SODIUM CHLORIDE 0.9 % IV SOLN
75.0000 mL/h | INTRAVENOUS | Status: DC
  Administered 2015-12-17: 75 mL/h via INTRAVENOUS

## 2015-12-17 MED ORDER — ALBUMIN HUMAN 5 % IV SOLN
INTRAVENOUS | Status: DC | PRN
  Administered 2015-12-17: 13:00:00 via INTRAVENOUS

## 2015-12-17 MED ORDER — SODIUM CHLORIDE 0.9 % IV BOLUS (SEPSIS)
250.0000 mL | Freq: Once | INTRAVENOUS | Status: AC
Start: 2015-12-17 — End: 2015-12-17
  Administered 2015-12-17: 250 mL via INTRAVENOUS

## 2015-12-17 MED ORDER — ONDANSETRON HCL 4 MG PO TABS
4.0000 mg | ORAL_TABLET | Freq: Four times a day (QID) | ORAL | Status: DC | PRN
  Filled 2015-12-17: qty 1

## 2015-12-17 MED ORDER — ENOXAPARIN SODIUM 40 MG/0.4ML ~~LOC~~ SOLN: 40.0000 mg | SUBCUTANEOUS | 0 refills | Status: DC

## 2015-12-17 MED ORDER — ONDANSETRON HCL 4 MG/2ML IJ SOLN
INTRAMUSCULAR | Status: DC | PRN
  Administered 2015-12-17: 4 mg via INTRAVENOUS

## 2015-12-17 SURGICAL SUPPLY — 50 items
APL SKNCLS STERI-STRIP NONHPOA (GAUZE/BANDAGES/DRESSINGS) ×1
BENZOIN TINCTURE PRP APPL 2/3 (GAUZE/BANDAGES/DRESSINGS) ×3 IMPLANT
BIT DRILL 4.2 (DRILL) IMPLANT
BLADE TFNA HELICAL 100 STRL (MISCELLANEOUS) ×2 IMPLANT
BOOTCOVER CLEANROOM LRG (PROTECTIVE WEAR) ×6 IMPLANT
CLOSURE STERI-STRIP 1/2X4 (GAUZE/BANDAGES/DRESSINGS) ×2
CLSR STERI-STRIP ANTIMIC 1/2X4 (GAUZE/BANDAGES/DRESSINGS) ×3 IMPLANT
COVER MAYO STAND STRL (DRAPES) ×2 IMPLANT
COVER PERINEAL POST (MISCELLANEOUS) ×3 IMPLANT
COVER SURGICAL LIGHT HANDLE (MISCELLANEOUS) ×3 IMPLANT
DRAPE INCISE IOBAN 66X45 STRL (DRAPES) ×2 IMPLANT
DRAPE STERI IOBAN 125X83 (DRAPES) ×3 IMPLANT
DRESSING ALLEVYN LIFE SACRUM (GAUZE/BANDAGES/DRESSINGS) ×2 IMPLANT
DRILL 4.2 (DRILL) ×3
DRSG MEPILEX BORDER 4X4 (GAUZE/BANDAGES/DRESSINGS) ×7 IMPLANT
DRSG MEPILEX BORDER 4X8 (GAUZE/BANDAGES/DRESSINGS) ×2 IMPLANT
DURAPREP 26ML APPLICATOR (WOUND CARE) ×3 IMPLANT
ELECT CAUTERY BLADE 6.4 (BLADE) ×3 IMPLANT
ELECT REM PT RETURN 9FT ADLT (ELECTROSURGICAL) ×3
ELECTRODE REM PT RTRN 9FT ADLT (ELECTROSURGICAL) ×1 IMPLANT
EVACUATOR 1/8 PVC DRAIN (DRAIN) IMPLANT
FACESHIELD WRAPAROUND (MASK) ×6 IMPLANT
FACESHIELD WRAPAROUND OR TEAM (MASK) ×2 IMPLANT
GAUZE XEROFORM 5X9 LF (GAUZE/BANDAGES/DRESSINGS) ×3 IMPLANT
GLOVE BIOGEL PI ORTHO PRO SZ8 (GLOVE) ×4
GLOVE ORTHO TXT STRL SZ7.5 (GLOVE) ×8 IMPLANT
GLOVE PI ORTHO PRO STRL SZ8 (GLOVE) ×2 IMPLANT
GLOVE SURG ORTHO 8.0 STRL STRW (GLOVE) ×6 IMPLANT
GOWN STRL REUS W/ TWL XL LVL3 (GOWN DISPOSABLE) ×1 IMPLANT
GOWN STRL REUS W/TWL 2XL LVL3 (GOWN DISPOSABLE) IMPLANT
GOWN STRL REUS W/TWL XL LVL3 (GOWN DISPOSABLE) ×3
GUIDEWIRE 3.2X400 (WIRE) ×2 IMPLANT
KIT BASIN OR (CUSTOM PROCEDURE TRAY) ×2 IMPLANT
KIT ROOM TURNOVER OR (KITS) ×3 IMPLANT
LINER BOOT UNIVERSAL DISP (MISCELLANEOUS) ×3 IMPLANT
MANIFOLD NEPTUNE II (INSTRUMENTS) ×3 IMPLANT
NAIL TROCH FIX LNG 11X420RT (Nail) ×2 IMPLANT
NS IRRIG 1000ML POUR BTL (IV SOLUTION) ×3 IMPLANT
PACK GENERAL/GYN (CUSTOM PROCEDURE TRAY) ×3 IMPLANT
PAD ARMBOARD 7.5X6 YLW CONV (MISCELLANEOUS) ×6 IMPLANT
SCREW LOCK 5.0MMX52NN (Screw) ×2 IMPLANT
STAPLER VISISTAT 35W (STAPLE) ×3 IMPLANT
SUT VIC AB 0 CTB1 27 (SUTURE) ×3 IMPLANT
SUT VIC AB 2-0 FS1 27 (SUTURE) ×3 IMPLANT
SUT VIC AB 2-0 SH 27 (SUTURE)
SUT VIC AB 2-0 SH 27XBRD (SUTURE) IMPLANT
SUT VIC AB 3-0 SH 8-18 (SUTURE) ×5 IMPLANT
TOWEL OR 17X24 6PK STRL BLUE (TOWEL DISPOSABLE) ×3 IMPLANT
TOWEL OR 17X26 10 PK STRL BLUE (TOWEL DISPOSABLE) ×3 IMPLANT
WATER STERILE IRR 1000ML POUR (IV SOLUTION) ×3 IMPLANT

## 2015-12-17 NOTE — Progress Notes (Signed)
Pharmacy note: prednisone  Pharmacy consulted to clarify the PTA prednisone dose. -Records here state she was taking 60mg  po dally (this was per family earlier today) -Family is not currently here to confirm and the patient can not supply further information  I called [facility] and they filled a prednisone prescription on 8/17 which was Prednisone (10mg  tabs) prescribed as -40mg  po daily x2 days -30mg  po daily x2 days -20mg  po daily x2 days -10mg  po daily x2 days -Off prednisone  Plan -Will leave a note regarding the prescibed dosing in the medication reconciliation -please contact pharmacy with any further needs  Harland Germanndrew Mallika Sanmiguel, Pharm D 12/17/2015 4:58 PM

## 2015-12-17 NOTE — Anesthesia Preprocedure Evaluation (Addendum)
Anesthesia Evaluation  Patient identified by MRN, date of birth, ID band Patient awake    Reviewed: Allergy & Precautions, H&P , NPO status , Patient's Chart, lab work & pertinent test results  Airway Mallampati: II  TM Distance: >3 FB Neck ROM: Full    Dental no notable dental hx. (+) Poor Dentition, Dental Advisory Given   Pulmonary COPD,  oxygen dependent, former smoker,    Pulmonary exam normal breath sounds clear to auscultation       Cardiovascular hypertension, + CAD, + CABG, + Peripheral Vascular Disease and +CHF  + dysrhythmias Atrial Fibrillation  Rhythm:Regular Rate:Normal     Neuro/Psych negative neurological ROS  negative psych ROS   GI/Hepatic negative GI ROS, Neg liver ROS,   Endo/Other  diabetes, Insulin Dependent  Renal/GU negative Renal ROS  negative genitourinary   Musculoskeletal   Abdominal   Peds  Hematology negative hematology ROS (+) anemia ,   Anesthesia Other Findings   Reproductive/Obstetrics negative OB ROS                            Anesthesia Physical Anesthesia Plan  ASA: IV  Anesthesia Plan: Spinal and MAC   Post-op Pain Management:    Induction: Intravenous  Airway Management Planned: Simple Face Mask  Additional Equipment:   Intra-op Plan:   Post-operative Plan:   Informed Consent: I have reviewed the patients History and Physical, chart, labs and discussed the procedure including the risks, benefits and alternatives for the proposed anesthesia with the patient or authorized representative who has indicated his/her understanding and acceptance.   Dental advisory given  Plan Discussed with: CRNA  Anesthesia Plan Comments:         Anesthesia Quick Evaluation

## 2015-12-17 NOTE — Discharge Instructions (Signed)

## 2015-12-17 NOTE — Op Note (Signed)
DATE OF SURGERY:  12/17/2015  TIME: 12:59 PM  PATIENT NAME:  [name]  AGE: 82 y.o.  PRE-OPERATIVE DIAGNOSIS:  RIGHT 4 part reverse obliquity intertrochanteric hip fracture  POST-OPERATIVE DIAGNOSIS:  SAME  PROCEDURE:  INTRAMEDULLARY (IM) NAIL FEMORAL  SURGEON:  Yussuf Sawyers P  ASSISTANT:  Janace LittenBrandon Parry, OPA-C, present and scrubbed throughout the case, critical for assistance with exposure, retraction, instrumentation, and closure.  OPERATIVE IMPLANTS: Synthes trochanteric femoral nail with interlocking helical blade into the femoral head with a distal interlocking bolt for rotational control.  UNIQUE ASPECTS OF THE CASE:  Extremely poor bone quality bone, with complex proximal femur fracture involving a split in the greater trochanter and a transverse reverse obliquity pattern.  PREOPERATIVE INDICATIONS:  [name] is a 82 y.o. year old who fell and suffered a hip fracture. She was brought into the ER and then admitted and optimized and then elected for surgical intervention.  She is a nonambulator at baseline, but was trying to walk out of bed when she fell.  The risks benefits and alternatives were discussed with the patient and her family including but not limited to the risks of nonoperative treatment, versus surgical intervention including infection, bleeding, nerve injury, malunion, nonunion, hardware prominence, hardware failure, need for hardware removal, blood clots, cardiopulmonary complications, morbidity, mortality, among others, and they were willing to proceed.    OPERATIVE PROCEDURE:  The patient was brought to the operating room and placed in the supine position. General anesthesia was administered, with a foley. She was placed on the fracture table.  Closed reduction was performed under C-arm guidance. The length of the femur was also measured using fluoroscopy. Time out was then performed after sterile prep and drape. She received preoperative  antibiotics.  Incision was made proximal to the greater trochanter. A guidewire was placed in the appropriate position. Confirmation was made on AP and lateral views. The above-named nail was opened. I opened the proximal femur with a reamer. I then placed the nail by hand easily down. I did not need to ream the femur.  Once the nail was completely seated, I placed a guidepin into the femoral head into the center center position. I measured the length, and then reamed the lateral cortex and up into the head. I then placed the helical blade. Slight compression was applied. Anatomic fixation achieved. Bone quality was mediocre.  I then secured the proximal interlocking bolt, and took off a half a turn, and then removed the instruments, and took final C-arm pictures AP and lateral the entire length of the leg. I secured the distal femur with a interlocking bolt in order to minimize rotational movement, and also to control vertical compression. He did place it with slight capacity for dynamization if necessary.   Anatomic reconstruction was achieved, and the wounds were irrigated copiously and closed with Vicryl followed by Steri-Strips and sterile gauze for the skin. The patient was awakened and returned to PACU in stable and satisfactory condition. There no complications and the patient tolerated the procedure well.  She will be weightbearing as tolerated, and will be on Lovenox  for a period of three weeks after discharge.   [name], M.D.

## 2015-12-17 NOTE — Consult Note (Signed)
ORTHOPAEDIC CONSULTATION  REQUESTING PHYSICIAN: Richarda OverlieNayana Abrol, MD  Chief Complaint: Right hip pain  HPI: [name] is a 82 y.o. female who complains of  right hip pain after a fall yesterday. She is unable to provide much history. The daughter is at the bedside. She is normally a nonambulator, but has had extreme difficulty with mobility within the bed since the fall. Pain is severe with motion, better with rest.   Past Medical History:  Diagnosis Date  . AAA (abdominal aortic aneurysm) (HCC)   . Aneurysm, aortic (HCC)    thoracic aorta, stable at 4.1 cm, chest CT, May, 2012  . Bradycardia   . CAD (coronary artery disease)    a. s/p CABG x5 (2005) LIMA->LAD, rSVG->diag, seq r SVG -> 1st OM and distal LCX, rSVG->PDA b. s/p BMS-prox SVG-D2 (2014)  . Carotid artery disease (HCC)    Hx of endarterectomy. Doppler October, 2011, stable, 0-39% RIC A., 40-59% LICA  . CHF with left ventricular diastolic dysfunction, NYHA class 2 (HCC)    a. EF 55-60%, echo, April, 2013 b. EF 55-60%, mild biatrial enlargement and PASP 42 mmH  . Chronic atrial fibrillation (HCC)    Not felt to be an anticoagulation candidate secondary to hx of GIB/AVM  . Complication of anesthesia   . COPD (chronic obstructive pulmonary disease) (HCC)    on home oxygen  . GI bleed 2008   AVMs  . Hematoma    Right groin hematoma, small, post cath, April, 2014  . Hx of CABG    CABG 2005  . Hyperlipidemia   . Hypertension   . Hyponatremia    Chronic. Felt secondary to SIADH   . Leg ulcer (HCC)    Ulcerated lesion on the anterior aspect of her left lower leg, June, 2014  . Mitral regurgitation    Mild, hospital, March, 2014  . Syncope    Nitroglycerin plus a diuretic, April, 2009  . Tobacco abuse   . Tuberculosis    Exposures to tuberculosis 1970s, has tested negative by the health Department  . Type 2 diabetes mellitus (HCC)   . Venous stasis of lower extremity    Chronic   Past Surgical History:   Procedure Laterality Date  . ABDOMINAL HYSTERECTOMY    . carotid endartercetomy    . CHOLECYSTECTOMY    . CORONARY ANGIOPLASTY WITH STENT PLACEMENT  07/18/12   severe native coronary artery disease, 99% proximal stenosis of the SVG-D2 status post successful PTCA/PCI with a Veriflex bare-metal stent, widely patent SVGs to both the right PDA sequential OM1 to OM 2, EF 65-70% and 3+ mitral regurgitation  . CORONARY ARTERY BYPASS GRAFT  2005  . ERCP N/A 11/20/2014   Procedure: ENDOSCOPIC RETROGRADE CHOLANGIOPANCREATOGRAPHY (ERCP);  Surgeon: Meryl DareMalcolm T Stark, MD;  [facility];  Service: Endoscopy;  Laterality: N/A;  . LEFT HEART CATHETERIZATION WITH CORONARY ANGIOGRAM N/A 07/18/2012   Procedure: LEFT HEART CATHETERIZATION WITH CORONARY ANGIOGRAM;  Surgeon: Marykay Lexavid W Harding, MD;  [facility];  Service: Cardiovascular;  Laterality: N/A;  . PERCUTANEOUS CORONARY STENT INTERVENTION (PCI-S)  07/18/2012   Procedure: PERCUTANEOUS CORONARY STENT INTERVENTION (PCI-S);  Surgeon: Marykay Lexavid W Harding, MD;  [facility];  Service: Cardiovascular;;   Social History   Social History  . Marital status: Widowed    Spouse name: N/A  . Number of children: 1  . Years of education: N/A   Occupational History  . retired     [employer]   Social History  Main Topics  . Smoking status: Former Smoker    Packs/day: 0.50    Years: 59.00    Types: Cigarettes    Quit date: 02/01/2013  . Smokeless tobacco: None  . Alcohol use No  . Drug use: No  . Sexual activity: Not Asked   Other Topics Concern  . None   Social History Narrative  . None   Family History  Problem Relation Age of Onset  . Congestive Heart Failure Mother   . Lung cancer Father   . Stroke Sister   . Breast cancer Sister   . Breast cancer Brother   . Lung cancer Sister   . Prostate cancer Brother   . Heart attack Neg Hx   . Hypertension Neg Hx    Allergies  Allergen Reactions  . Other Other (See Comments)     TETANUS-can only take 1/2 dose at one time, can take the other 1/2 dose about 3 days later  . Codeine Nausea And Vomiting  . Sulfonamide Derivatives Nausea And Vomiting  . Warfarin     H/o AVM's prechief complaintcluding anticoagulation  . Penicillins Itching, Rash and Other (See Comments)    Nasal itching   . Ranitidine Hcl Itching and Rash     Positive ROS: All other systems have been reviewed and were otherwise negative with the exception of those mentioned in the HPI and as above.  Physical Exam: General: Patient is awake, and interactive, but does not have normal mental function and does not really answer questions reliably. Cardiovascular: Mild pedal edema on the right side, also on the left. Respiratory: No cyanosis, no use of accessory musculature GI: No organomegaly, abdomen is soft and non-tender Skin: No lesions in the area of chief complaint, she does have a large ecchymotic area over the proximal medial tibia on the right side. Neurologic: Sensation intact distally Psychiatric: Patient is not competent for consent, daughter is at the bedside Lymphatic: No axillary or cervical lymphadenopathy  MUSCULOSKELETAL: Right leg has positive pain with log roll, EHL and FHL are intact. Unable to do a straight leg raise. Severe pain to palpation over the right hip.  Assessment: Active Problems:   Hypertension   Aneurysm, aortic (HCC)   Syncope   Carotid artery disease (HCC)   Atrial fibrillation (HCC)   Insulin dependent type 2 diabetes mellitus (HCC)   Hip fracture (HCC)   Complex reverse obliquity right intertrochanteric hip fracture, history of left hip fracture in a non-ambulating 82 year old female with high risk coexisting comorbidities.  Plan: This is an acute severe injury, I discussed the options of nonoperative, palliative care with the family versus surgical intervention. They have elected for palliative surgical intervention in order to optimize her bed mobility  and capacity to sit.  The risks benefits and alternatives were discussed with the patient including but not limited to the risks of nonoperative treatment, versus surgical intervention including infection, bleeding, nerve injury, malunion, nonunion, the need for revision surgery, hardware prominence, hardware failure, the need for hardware removal, blood clots, cardiopulmonary complications, morbidity, mortality, among others, and they were willing to proceed.    We will plan for surgery later today as long she has been optimized.   Eulas PostLANDAU,Niveah Boerner P, MD Cell [phone]   12/17/2015 7:44 AM

## 2015-12-17 NOTE — Progress Notes (Signed)
Patient daughter -in-law [name] is POA. States patient is forgetful and not eating and has not walk in 2 years. [name] is coming early this a.m. To sign permit for patient.

## 2015-12-17 NOTE — Anesthesia Postprocedure Evaluation (Signed)
Anesthesia Post Note  Patient: [name]  Procedure(s) Performed: Procedure(s) (LRB): INTRAMEDULLARY (IM) NAIL FEMORAL (Right)  Patient location during evaluation: PACU Anesthesia Type: Spinal Level of consciousness: oriented and awake and alert Pain management: pain level controlled Vital Signs Assessment: post-procedure vital signs reviewed and stable Respiratory status: spontaneous breathing, respiratory function stable and patient connected to nasal cannula oxygen Cardiovascular status: blood pressure returned to baseline and stable Postop Assessment: no headache and no backache Anesthetic complications: no    Last Vitals:  Vitals:   12/17/15 1500 12/17/15 1515  BP: 103/65 (!) 113/58  Pulse: 74   Resp: 14   Temp: 36.8 C     Last Pain:  Vitals:   12/17/15 1500  TempSrc:   PainSc: 0-No pain                 Tresean Mattix S

## 2015-12-17 NOTE — Anesthesia Procedure Notes (Signed)
Spinal  Patient location during procedure: OR Start time: 12/17/2015 11:33 AM End time: 12/17/2015 11:43 AM Staffing Anesthesiologist: Gaynelle AduFITZGERALD, Miliyah Luper Performed: anesthesiologist  Preanesthetic Checklist Completed: patient identified, surgical consent, pre-op evaluation, timeout performed, IV checked, risks and benefits discussed and monitors and equipment checked Spinal Block Patient position: sitting Prep: Betadine Patient monitoring: cardiac monitor, continuous pulse ox and blood pressure Approach: midline Location: L4-5 Injection technique: single-shot Needle Needle type: Quincke  Needle gauge: 25 G Needle length: 9 cm Assessment Sensory level: T6 Additional Notes Functioning IV was confirmed and monitors were applied. Sterile prep and drape, including hand hygiene and sterile gloves were used. The patient was positioned and the spine was prepped. The skin was anesthetized with lidocaine.  Free flow of clear CSF was obtained prior to injecting local anesthetic into the CSF.  The spinal needle aspirated freely following injection.  The needle was carefully withdrawn.  The patient tolerated the procedure well.

## 2015-12-17 NOTE — Progress Notes (Signed)
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 She has a right intratrochanteric fracture. Orthopedic surgery, Dr. Osie BondLandeau has seen her and requested a consultation given her vast cardiovascular history \ Assessment/Plan Right intertrochanteric fracture with varus angulation Patient also has bruising of right shin area with possible fracture there,  x-rays of the right shin negative for fracture , CT of the head negative for intracranial bleed Dr Dion SaucierLandau has been consulted, he   requested preoperative clearance,  CT chest abdomen pelvis to evaluate the size of the patient's aneurysm does not show any aneurysm in the chest , preop echo, cardiology consulted, per Dr Anne FuSkains  may proceed with surgery with caution. Without intramedullary nail, she would likely be fairly disabled and without movement, her mortality would increase dramatically. Lovenox for DVT prophylaxis S/p  INTRAMEDULLARY (IM) NAIL FEMORAL    Hypertension-Continue cardizem , dc lasix due to low BP in the 60's , continue stress dose steroids    Aneurysm, aortic (HCC)-CT of the chest negative for  intrathoracic   aortic aneurysm      Carotid  artery disease (HCC)-Hx of endarterectomy. Doppler October, 2011, stable, 0-39% RIC A., 40-59% LICA, cardiac enzymes negative     Atrial fibrillation (HCC)-Rate controlled on digoxin and Cardizem, check digoxin level 0.2, will need to clarify home meds again    Insulin dependent type 2 diabetes mellitus (HCC)-Continue Lantus and sliding scale insulin,   hemoglobin A1c 5.0   COPD exacerbation - on exam patient has diffuse wheezing. Continue outpatient nebulizers, appears to be chronically on prednisone. Increase stress dose steroids as BP soft  Diastolic CHF - appears compensated discontinue Lasix due to low BP .    Chronic anemia - follow CBC   DVT prophylaxsis lovenox  Code Status:      Code Status Orders full code        Start     Ordered   12/16/15 1700  Full code  Continuous     12/16/15 1701    Code Status History      Family Communication: Discussed in detail with the patient, all imaging results, lab results explained to the patient   Disposition Plan: will need SNF     *  Consultants:  Ortho  cards  Procedures:  INTRAMEDULLARY (IM) NAIL FEMORAL   Antibiotics: Anti-infectives    Start     Dose/Rate Route Frequency Ordered Stop   12/17/15 1800  ceFAZolin (ANCEF) IVPB 2g/100 mL premix     2 g 200 mL/hr over 30 Minutes Intravenous Every 6 hours 12/17/15 1526 12/18/15 0559   12/17/15 0800  ceFAZolin (ANCEF) IVPB 2g/100 mL premix     2 g 200 mL/hr over 30 Minutes Intravenous To [facility] 12/16/15 2254 12/17/15 1209  12/17/15 0600  ceFAZolin (ANCEF) IVPB 2g/100 mL premix  Status:  Discontinued     2 g 200 mL/hr over 30 Minutes Intravenous On call to O.R. 12/16/15 2254 12/16/15 2259         HPI/Subjective:  bp soft,tele shows a fib     Objective: Vitals:   12/17/15 1430 12/17/15 1445 12/17/15 1500 12/17/15 1515  BP: 116/62 105/66 103/65 (!) 113/58  Pulse: 86 98 74 81  Resp: (!) 21 (!) 25 14 16   Temp:   98.2 F (36.8 C)    TempSrc:      SpO2: 96% 94% 93% 93%  Weight:      Height:        Intake/Output Summary (Last 24 hours) at 12/17/15 1548 Last data filed at 12/17/15 1500  Gross per 24 hour  Intake             1490 ml  Output             1110 ml  Net              380 ml    Exam:  Examination:  General exam: Appears calm and comfortable  Respiratory system: Clear to auscultation. Respiratory effort normal. Cardiovascular system: S1 & S2 heard, RRR. No JVD, murmurs, rubs, gallops or clicks. No pedal edema. Gastrointestinal system: Abdomen is nondistended, soft and nontender. No organomegaly or masses felt. Normal bowel sounds heard. Central nervous system: Alert and oriented. No focal neurological deficits. Extremities: Symmetric 5 x 5 power. Skin: No rashes, lesions or ulcers Psychiatry: Judgement and insight appear normal. Mood & affect appropriate.     Data Reviewed: I have personally reviewed following labs and imaging studies  Micro Results Recent Results (from the past 240 hour(s))  Surgical pcr screen     Status: None   Collection Time: 12/16/15 10:47 PM  Result Value Ref Range Status   MRSA, PCR NEGATIVE NEGATIVE Final   Staphylococcus aureus NEGATIVE NEGATIVE Final    Comment:        The Xpert SA Assay (FDA approved for NASAL specimens in patients over 45 years of age), is one component of a comprehensive surveillance program.  Test performance has been validated by [facility] for patients greater than or equal to 65 year old. It is not intended to diagnose infection nor to guide or monitor treatment.     Radiology Reports Dg Chest 1 View  Result Date: 12/16/2015 CLINICAL DATA:  Right hip fracture.  Fell today. EXAM: CHEST 1 VIEW COMPARISON:  12/16/2015.  Rash that earlier today. FINDINGS: Enlarged cardiac silhouette with post CABG changes. The pulmonary vasculature is prominent. Continued evidence of bilateral pleural fluid, greater on the right compared to the left.  Probable calcified granuloma in the right lower lung zone. Diffuse osteopenia. Atheromatous arterial calcifications, including the thoracic aorta. IMPRESSION: 1. Pulmonary vascular congestion and mild cardiomegaly. 2. Small bilateral pleural effusions. 3. Aortic atherosclerosis. Electronically Signed   By: Beckie Salts M.D.   On: 12/16/2015 20:12   Dg Chest 2 View  Result Date: 12/16/2015 CLINICAL DATA:  Patient comes from home, states got up to the restroom and fell. Right leg is externally rotated. Pt having right hip pain and right elbow pain, pt has small laceration to posterior elbow. Hx HTN, TB, CABG, COPD, afib, CHF, CAD EXAM: CHEST  2 VIEW COMPARISON:  07/10/2015 FINDINGS: Status post median sternotomy and CABG. The heart is normal in size. There are bilateral pleural effusions and bibasilar  opacities consistent atelectasis or early infiltrates. There is no pneumothorax. Patient is kyphotic with wedge compression fractures at numerous thoracic levels, likely chronic. IMPRESSION: 1. Bilateral pleural effusions and bibasilar atelectasis or early infiltrates. 2. No acute fracture. Electronically Signed   By: Norva Pavlov M.D.   On: 12/16/2015 15:13   Dg Elbow Complete Right  Result Date: 12/16/2015 CLINICAL DATA:  Patient comes from home, states got up to the restroom and fell. Right leg is externally rotated. Pt having right hip pain and right elbow pain, pt has small laceration to posterior elbow. Hx HTN, TB, CABG, COPD, afib, CHF, CAD EXAM: RIGHT ELBOW - COMPLETE 3+ VIEW COMPARISON:  None. FINDINGS: There is no evidence of fracture, dislocation, or joint effusion. There is no evidence of arthropathy or other focal bone abnormality. Soft tissues are unremarkable. IMPRESSION: Negative. Electronically Signed   By: Norva Pavlov M.D.   On: 12/16/2015 15:06   Dg Tibia/fibula Right  Result Date: 12/16/2015 CLINICAL DATA:  Large right lower leg hematoma superior and medial to the midportion of  the lower leg after a fall today. Right hip fracture. EXAM: RIGHT TIBIA AND FIBULA - 2 VIEW COMPARISON:  Right hip radiographs obtained earlier today. FINDINGS: The frontal view is oblique and does not include the distal portion of the lower leg. The lateral view does not include the proximal portion of the lower leg. Diffuse osteopenia is demonstrated as well as atheromatous arterial calcifications. No fracture or dislocation is seen on these views. IMPRESSION: Limited examination with no fracture or dislocation seen. A routine two-view examination is recommended when possible. Electronically Signed   By: Beckie Salts M.D.   On: 12/16/2015 20:10   Ct Head Wo Contrast  Result Date: 12/16/2015 CLINICAL DATA:  Fall in bathroom at home.  Right scalp hematoma EXAM: CT HEAD WITHOUT CONTRAST CT CERVICAL SPINE WITHOUT CONTRAST TECHNIQUE: Multidetector CT imaging of the head and cervical spine was performed following the standard protocol without intravenous contrast. Multiplanar CT image reconstructions of the cervical spine were also generated. COMPARISON:  05/01/2015 FINDINGS: CT HEAD FINDINGS There is atrophy and chronic small vessel disease changes. No acute intracranial abnormality. Specifically, no hemorrhage, hydrocephalus, mass lesion, acute infarction, or significant intracranial injury. Mild soft tissue swelling in the right temporal region. Visualized paranasal sinuses and mastoids clear. Orbital soft tissues unremarkable. CT CERVICAL SPINE FINDINGS Normal alignment. Degenerative disc and facet disease throughout the cervical spine. Prevertebral soft tissues are normal. No fracture. No epidural or paraspinal hematoma. Airspace opacities noted in the apices bilaterally, right greater than left. This likely reflects scarring, but elective chest CT would be helpful to completely exclude mass, particularly in the right apex. IMPRESSION: No acute intracranial abnormality.Atrophy, chronic microvascular disease.  Cervical spondylosis.  No acute bony abnormality. Densities in the apices bilaterally, likely scarring. However, elective chest CT would be helpful to completely exclude mass in the right upper lobe. Electronically Signed   By: Charlett Nose M.D.   On: 12/16/2015 15:30   Ct Cervical Spine Wo Contrast  Result Date: 12/16/2015 CLINICAL DATA:  Fall in bathroom at home.  Right scalp hematoma EXAM: CT HEAD WITHOUT CONTRAST CT CERVICAL SPINE WITHOUT CONTRAST TECHNIQUE: Multidetector CT imaging of the head and cervical spine was performed following the standard protocol without intravenous contrast. Multiplanar CT image reconstructions of the cervical spine were also generated. COMPARISON:  05/01/2015 FINDINGS: CT HEAD FINDINGS There is atrophy and chronic small vessel disease changes. No acute intracranial abnormality. Specifically, no hemorrhage, hydrocephalus,  mass lesion, acute infarction, or significant intracranial injury. Mild soft tissue swelling in the right temporal region. Visualized paranasal sinuses and mastoids clear. Orbital soft tissues unremarkable. CT CERVICAL SPINE FINDINGS Normal alignment. Degenerative disc and facet disease throughout the cervical spine. Prevertebral soft tissues are normal. No fracture. No epidural or paraspinal hematoma. Airspace opacities noted in the apices bilaterally, right greater than left. This likely reflects scarring, but elective chest CT would be helpful to completely exclude mass, particularly in the right apex. IMPRESSION: No acute intracranial abnormality.Atrophy, chronic microvascular disease. Cervical spondylosis.  No acute bony abnormality. Densities in the apices bilaterally, likely scarring. However, elective chest CT would be helpful to completely exclude mass in the right upper lobe. Electronically Signed   By: Charlett Nose M.D.   On: 12/16/2015 15:30   Pelvis Portable  Result Date: 12/17/2015 CLINICAL DATA:  Postop day 0 ORIF intertrochanteric right  femoral neck fracture with intramedullary nail. EXAM: PORTABLE PELVIS 1-2 VIEWS 1:57 p.m.: COMPARISON:  Intraoperative right femur x-rays earlier today at 12:45 p.m. Right hip x-rays 12/16/2015. FINDINGS: ORIF of the intertrochanteric right femoral neck fracture with an intramedullary nail. Alignment appears near anatomic in the AP projection. Generalized osseous demineralization. Prior left hip arthroplasty with anatomic alignment. IMPRESSION: Near anatomic alignment in the AP projection post ORIF of the intertrochanteric right femoral neck fracture. Electronically Signed   By: Hulan Saas M.D.   On: 12/17/2015 14:13   Ct Angio Chest/abd/pel For Dissection W And/or W/wo  Result Date: 12/16/2015 CLINICAL DATA:  Larey Seat today shortness of breath. Tachypnea. Diffusely decreased breath sounds. History of abdominal aortic aneurysm and COPD. Clinical concern for aortic dissection. EXAM: CT ANGIOGRAPHY CHEST, ABDOMEN AND PELVIS TECHNIQUE: Multidetector CT imaging through the chest, abdomen and pelvis was performed using the standard protocol during bolus administration of intravenous contrast. Multiplanar reconstructed images and MIPs were obtained and reviewed to evaluate the vascular anatomy. CONTRAST:  100 cc Isovue 370 COMPARISON:  Abdomen and pelvis CT examinations dated 11/18/2014 and 05/02/2014. Chest radiographs dated 12/16/2015. FINDINGS: CTA CHEST FINDINGS Cardiovascular: Extensive atheromatous calcifications, including the thoracic aorta and coronary arteries. No aneurysm or dissection seen. Mediastinum:  No enlarged lymph nodes. Lungs and pleura: Small to moderate-sized right pleural effusion and small left pleural effusion. Associated mild right lower lobe atelectasis and minimal left lower lobe atelectasis. Diffuse bullous changes throughout both lungs. Biapical pleural and parenchymal scarring. 4 mm right upper lobe nodule on image number 32 of series 506. Musculoskeletal:  Thoracic spine degenerative  changes. Review of the MIP images confirms the above findings. CTA ABDOMEN AND PELVIS FINDINGS Vascular: Atheromatous arterial calcifications. Ectasia of the infrarenal abdominal aorta with a maximum diameter of the 2.9 cm. This previously measured 3.2 cm. No dissection. Hepatobiliary: Cholecystectomy clips. Interval biliary air. No significant change in a small probable cyst in the anterior aspect of the left lobe of the liver, measuring 4 mm on image number 143 of series 501. Pancreas:  Air in the common duct.  Mild diffuse pancreatic atrophy. Spleen:  Small cysts. Adrenal/ urinary tract: Normal appearing adrenal glands. Interval small cortical indentation or angiomyolipoma in the lateral aspect of the lower pole of the left kidney. Unremarkable ureters and urinary bladder. Gastrointestinal: Multiple colonic diverticula. No gastric or small bowel abnormalities. Normal appearing appendix. Lymph nodes:  No enlarged lymph nodes. Other:  Tiny umbilical hernia containing fat. Musculoskeletal: Left hip prosthesis with associated streak artifacts. Comminuted right intertrochanteric fracture with varus angulation and posterior angulation of the distal fragment.  Mild anterior displacement of the distal fragment approximately 15% L1 vertebral compression deformity with minimal bony retropulsion. No acute fracture lines. Review of the MIP images confirms the above findings. IMPRESSION: 1. No aortic dissection. 2. Ectatic infrarenal abdominal aorta with a maximum diameter of 2.9 cm. 3. Aortic atherosclerosis and coronary artery atherosclerosis. 4. Small to moderate-sized right pleural effusion and small left pleural effusion. 5. 4 mm right upper lobe nodule. 6. COPD. 7. Interval biliary air. This could be due to recent passage of a common duct stone. 8. Extensive colonic diverticulosis. 9. Comminuted right intertrochanteric fracture with varus and posterior angulation of the distal fragment. 10. Old 50% L1 vertebral  compression fracture. Electronically Signed   By: Beckie SaltsSteven  Reid M.D.   On: 12/16/2015 19:53   Dg Hip Unilat With Pelvis 2-3 Views Right  Result Date: 12/16/2015 CLINICAL DATA:  Fall.  Right hip pain. EXAM: DG HIP (WITH OR WITHOUT PELVIS) 2-3V RIGHT COMPARISON:  None. FINDINGS: Changes of prior left hip replacement. There is the comminuted right femoral intertrochanteric fracture with varus angulation. No subluxation or dislocation. IMPRESSION: Right intertrochanteric fracture with varus angulation. Electronically Signed   By: Charlett NoseKevin  Dover M.D.   On: 12/16/2015 15:09   Dg Femur, Min 2 Views Right  Result Date: 12/17/2015 CLINICAL DATA:  Right femoral fracture EXAM: RIGHT FEMUR 2 VIEWS COMPARISON:  None. FLUOROSCOPY TIME:  Radiation Exposure Index (as provided by the fluoroscopic device): Not available If the device does not provide the exposure index: Fluoroscopy Time:  1 minutes 31 seconds Number of Acquired Images:  4 FINDINGS: Medullary rod is noted in the right femur. Proximal and distal fixation screws are noted. The intratrochanteric fracture is in near anatomic alignment. IMPRESSION: Status post ORIF of proximal right femoral fracture Electronically Signed   By: Alcide CleverMark  Lukens M.D.   On: 12/17/2015 13:40     CBC  Recent Labs Lab 12/16/15 1548 12/16/15 1835 12/17/15 0537  WBC 11.6* 14.3* 11.8*  HGB 10.5* 10.7* 9.8*  HCT 34.5* 34.9* 31.8*  PLT 185 202 205  MCV 88.5 87.9 87.6  MCH 26.9 27.0 27.0  MCHC 30.4 30.7 30.8  RDW 14.6 14.3 14.4  LYMPHSABS 0.3*  --   --   MONOABS 0.4  --   --   EOSABS 0.0  --   --   BASOSABS 0.0  --   --     Chemistries   Recent Labs Lab 12/16/15 1548 12/16/15 1835 12/17/15 0537  NA 137  --  136  K 4.1  --  4.4  CL 105  --  101  CO2 25  --  29  GLUCOSE 189*  --  144*  BUN 23*  --  23*  CREATININE 0.85 0.86 0.88  CALCIUM 9.9  --  9.9  AST  --  15  --   ALT  --  14  --   ALKPHOS  --  48  --   BILITOT  --  0.7  --     ------------------------------------------------------------------------------------------------------------------ estimated creatinine clearance is 48.6 mL/min (by C-G formula based on SCr of 0.88 mg/dL). ------------------------------------------------------------------------------------------------------------------  Recent Labs  12/16/15 1854  HGBA1C 5.0   ------------------------------------------------------------------------------------------------------------------ No results for input(s): CHOL, HDL, LDLCALC, TRIG, CHOLHDL, LDLDIRECT in the last 72 hours. ------------------------------------------------------------------------------------------------------------------  Recent Labs  12/17/15 0009  TSH 1.320   ------------------------------------------------------------------------------------------------------------------ No results for input(s): VITAMINB12, FOLATE, FERRITIN, TIBC, IRON, RETICCTPCT in the last 72 hours.  Coagulation profile  Recent Labs Lab 12/17/15 0537  INR 1.02  No results for input(s): DDIMER in the last 72 hours.  Cardiac Enzymes  Recent Labs Lab 12/16/15 1835 12/17/15 0009 12/17/15 0537  TROPONINI <0.03 <0.03 <0.03   ------------------------------------------------------------------------------------------------------------------ Invalid input(s): POCBNP   CBG:  Recent Labs Lab 12/16/15 2119 12/17/15 0632 12/17/15 0945 12/17/15 1334  GLUCAP 185* 136* 118* 117*       Studies: Dg Chest 1 View  Result Date: 12/16/2015 CLINICAL DATA:  Right hip fracture.  Fell today. EXAM: CHEST 1 VIEW COMPARISON:  12/16/2015.  Rash that earlier today. FINDINGS: Enlarged cardiac silhouette with post CABG changes. The pulmonary vasculature is prominent. Continued evidence of bilateral pleural fluid, greater on the right compared to the left. Probable calcified granuloma in the right lower lung zone. Diffuse osteopenia. Atheromatous arterial  calcifications, including the thoracic aorta. IMPRESSION: 1. Pulmonary vascular congestion and mild cardiomegaly. 2. Small bilateral pleural effusions. 3. Aortic atherosclerosis. Electronically Signed   By: Beckie Salts M.D.   On: 12/16/2015 20:12   Dg Chest 2 View  Result Date: 12/16/2015 CLINICAL DATA:  Patient comes from home, states got up to the restroom and fell. Right leg is externally rotated. Pt having right hip pain and right elbow pain, pt has small laceration to posterior elbow. Hx HTN, TB, CABG, COPD, afib, CHF, CAD EXAM: CHEST  2 VIEW COMPARISON:  07/10/2015 FINDINGS: Status post median sternotomy and CABG. The heart is normal in size. There are bilateral pleural effusions and bibasilar opacities consistent atelectasis or early infiltrates. There is no pneumothorax. Patient is kyphotic with wedge compression fractures at numerous thoracic levels, likely chronic. IMPRESSION: 1. Bilateral pleural effusions and bibasilar atelectasis or early infiltrates. 2. No acute fracture. Electronically Signed   By: Norva Pavlov M.D.   On: 12/16/2015 15:13   Dg Elbow Complete Right  Result Date: 12/16/2015 CLINICAL DATA:  Patient comes from home, states got up to the restroom and fell. Right leg is externally rotated. Pt having right hip pain and right elbow pain, pt has small laceration to posterior elbow. Hx HTN, TB, CABG, COPD, afib, CHF, CAD EXAM: RIGHT ELBOW - COMPLETE 3+ VIEW COMPARISON:  None. FINDINGS: There is no evidence of fracture, dislocation, or joint effusion. There is no evidence of arthropathy or other focal bone abnormality. Soft tissues are unremarkable. IMPRESSION: Negative. Electronically Signed   By: Norva Pavlov M.D.   On: 12/16/2015 15:06   Dg Tibia/fibula Right  Result Date: 12/16/2015 CLINICAL DATA:  Large right lower leg hematoma superior and medial to the midportion of the lower leg after a fall today. Right hip fracture. EXAM: RIGHT TIBIA AND FIBULA - 2 VIEW COMPARISON:   Right hip radiographs obtained earlier today. FINDINGS: The frontal view is oblique and does not include the distal portion of the lower leg. The lateral view does not include the proximal portion of the lower leg. Diffuse osteopenia is demonstrated as well as atheromatous arterial calcifications. No fracture or dislocation is seen on these views. IMPRESSION: Limited examination with no fracture or dislocation seen. A routine two-view examination is recommended when possible. Electronically Signed   By: Beckie Salts M.D.   On: 12/16/2015 20:10   Ct Head Wo Contrast  Result Date: 12/16/2015 CLINICAL DATA:  Fall in bathroom at home.  Right scalp hematoma EXAM: CT HEAD WITHOUT CONTRAST CT CERVICAL SPINE WITHOUT CONTRAST TECHNIQUE: Multidetector CT imaging of the head and cervical spine was performed following the standard protocol without intravenous contrast. Multiplanar CT image reconstructions of the cervical spine were also generated.  COMPARISON:  05/01/2015 FINDINGS: CT HEAD FINDINGS There is atrophy and chronic small vessel disease changes. No acute intracranial abnormality. Specifically, no hemorrhage, hydrocephalus, mass lesion, acute infarction, or significant intracranial injury. Mild soft tissue swelling in the right temporal region. Visualized paranasal sinuses and mastoids clear. Orbital soft tissues unremarkable. CT CERVICAL SPINE FINDINGS Normal alignment. Degenerative disc and facet disease throughout the cervical spine. Prevertebral soft tissues are normal. No fracture. No epidural or paraspinal hematoma. Airspace opacities noted in the apices bilaterally, right greater than left. This likely reflects scarring, but elective chest CT would be helpful to completely exclude mass, particularly in the right apex. IMPRESSION: No acute intracranial abnormality.Atrophy, chronic microvascular disease. Cervical spondylosis.  No acute bony abnormality. Densities in the apices bilaterally, likely scarring.  However, elective chest CT would be helpful to completely exclude mass in the right upper lobe. Electronically Signed   By: Charlett Nose M.D.   On: 12/16/2015 15:30   Ct Cervical Spine Wo Contrast  Result Date: 12/16/2015 CLINICAL DATA:  Fall in bathroom at home.  Right scalp hematoma EXAM: CT HEAD WITHOUT CONTRAST CT CERVICAL SPINE WITHOUT CONTRAST TECHNIQUE: Multidetector CT imaging of the head and cervical spine was performed following the standard protocol without intravenous contrast. Multiplanar CT image reconstructions of the cervical spine were also generated. COMPARISON:  05/01/2015 FINDINGS: CT HEAD FINDINGS There is atrophy and chronic small vessel disease changes. No acute intracranial abnormality. Specifically, no hemorrhage, hydrocephalus, mass lesion, acute infarction, or significant intracranial injury. Mild soft tissue swelling in the right temporal region. Visualized paranasal sinuses and mastoids clear. Orbital soft tissues unremarkable. CT CERVICAL SPINE FINDINGS Normal alignment. Degenerative disc and facet disease throughout the cervical spine. Prevertebral soft tissues are normal. No fracture. No epidural or paraspinal hematoma. Airspace opacities noted in the apices bilaterally, right greater than left. This likely reflects scarring, but elective chest CT would be helpful to completely exclude mass, particularly in the right apex. IMPRESSION: No acute intracranial abnormality.Atrophy, chronic microvascular disease. Cervical spondylosis.  No acute bony abnormality. Densities in the apices bilaterally, likely scarring. However, elective chest CT would be helpful to completely exclude mass in the right upper lobe. Electronically Signed   By: Charlett Nose M.D.   On: 12/16/2015 15:30   Pelvis Portable  Result Date: 12/17/2015 CLINICAL DATA:  Postop day 0 ORIF intertrochanteric right femoral neck fracture with intramedullary nail. EXAM: PORTABLE PELVIS 1-2 VIEWS 1:57 p.m.: COMPARISON:   Intraoperative right femur x-rays earlier today at 12:45 p.m. Right hip x-rays 12/16/2015. FINDINGS: ORIF of the intertrochanteric right femoral neck fracture with an intramedullary nail. Alignment appears near anatomic in the AP projection. Generalized osseous demineralization. Prior left hip arthroplasty with anatomic alignment. IMPRESSION: Near anatomic alignment in the AP projection post ORIF of the intertrochanteric right femoral neck fracture. Electronically Signed   By: Hulan Saas M.D.   On: 12/17/2015 14:13   Ct Angio Chest/abd/pel For Dissection W And/or W/wo  Result Date: 12/16/2015 CLINICAL DATA:  Larey Seat today shortness of breath. Tachypnea. Diffusely decreased breath sounds. History of abdominal aortic aneurysm and COPD. Clinical concern for aortic dissection. EXAM: CT ANGIOGRAPHY CHEST, ABDOMEN AND PELVIS TECHNIQUE: Multidetector CT imaging through the chest, abdomen and pelvis was performed using the standard protocol during bolus administration of intravenous contrast. Multiplanar reconstructed images and MIPs were obtained and reviewed to evaluate the vascular anatomy. CONTRAST:  100 cc Isovue 370 COMPARISON:  Abdomen and pelvis CT examinations dated 11/18/2014 and 05/02/2014. Chest radiographs dated 12/16/2015. FINDINGS: CTA CHEST  FINDINGS Cardiovascular: Extensive atheromatous calcifications, including the thoracic aorta and coronary arteries. No aneurysm or dissection seen. Mediastinum:  No enlarged lymph nodes. Lungs and pleura: Small to moderate-sized right pleural effusion and small left pleural effusion. Associated mild right lower lobe atelectasis and minimal left lower lobe atelectasis. Diffuse bullous changes throughout both lungs. Biapical pleural and parenchymal scarring. 4 mm right upper lobe nodule on image number 32 of series 506. Musculoskeletal:  Thoracic spine degenerative changes. Review of the MIP images confirms the above findings. CTA ABDOMEN AND PELVIS FINDINGS  Vascular: Atheromatous arterial calcifications. Ectasia of the infrarenal abdominal aorta with a maximum diameter of the 2.9 cm. This previously measured 3.2 cm. No dissection. Hepatobiliary: Cholecystectomy clips. Interval biliary air. No significant change in a small probable cyst in the anterior aspect of the left lobe of the liver, measuring 4 mm on image number 143 of series 501. Pancreas:  Air in the common duct.  Mild diffuse pancreatic atrophy. Spleen:  Small cysts. Adrenal/ urinary tract: Normal appearing adrenal glands. Interval small cortical indentation or angiomyolipoma in the lateral aspect of the lower pole of the left kidney. Unremarkable ureters and urinary bladder. Gastrointestinal: Multiple colonic diverticula. No gastric or small bowel abnormalities. Normal appearing appendix. Lymph nodes:  No enlarged lymph nodes. Other:  Tiny umbilical hernia containing fat. Musculoskeletal: Left hip prosthesis with associated streak artifacts. Comminuted right intertrochanteric fracture with varus angulation and posterior angulation of the distal fragment. Mild anterior displacement of the distal fragment approximately 15% L1 vertebral compression deformity with minimal bony retropulsion. No acute fracture lines. Review of the MIP images confirms the above findings. IMPRESSION: 1. No aortic dissection. 2. Ectatic infrarenal abdominal aorta with a maximum diameter of 2.9 cm. 3. Aortic atherosclerosis and coronary artery atherosclerosis. 4. Small to moderate-sized right pleural effusion and small left pleural effusion. 5. 4 mm right upper lobe nodule. 6. COPD. 7. Interval biliary air. This could be due to recent passage of a common duct stone. 8. Extensive colonic diverticulosis. 9. Comminuted right intertrochanteric fracture with varus and posterior angulation of the distal fragment. 10. Old 50% L1 vertebral compression fracture. Electronically Signed   By: Beckie Salts M.D.   On: 12/16/2015 19:53   Dg Hip  Unilat With Pelvis 2-3 Views Right  Result Date: 12/16/2015 CLINICAL DATA:  Fall.  Right hip pain. EXAM: DG HIP (WITH OR WITHOUT PELVIS) 2-3V RIGHT COMPARISON:  None. FINDINGS: Changes of prior left hip replacement. There is the comminuted right femoral intertrochanteric fracture with varus angulation. No subluxation or dislocation. IMPRESSION: Right intertrochanteric fracture with varus angulation. Electronically Signed   By: Charlett Nose M.D.   On: 12/16/2015 15:09   Dg Femur, Min 2 Views Right  Result Date: 12/17/2015 CLINICAL DATA:  Right femoral fracture EXAM: RIGHT FEMUR 2 VIEWS COMPARISON:  None. FLUOROSCOPY TIME:  Radiation Exposure Index (as provided by the fluoroscopic device): Not available If the device does not provide the exposure index: Fluoroscopy Time:  1 minutes 31 seconds Number of Acquired Images:  4 FINDINGS: Medullary rod is noted in the right femur. Proximal and distal fixation screws are noted. The intratrochanteric fracture is in near anatomic alignment. IMPRESSION: Status post ORIF of proximal right femoral fracture Electronically Signed   By: Alcide Clever M.D.   On: 12/17/2015 13:40      Lab Results  Component Value Date   HGBA1C 5.0 12/16/2015   HGBA1C 5.7 (H) 04/17/2015   HGBA1C 5.5 11/18/2014   Lab Results  Component Value  Date   LDLCALC 53 04/17/2015   CREATININE 0.88 12/17/2015       Scheduled Meds: . atorvastatin  20 mg Oral q1800  .  ceFAZolin (ANCEF) IV  2 g Intravenous Q6H  . digoxin  0.0625 mg Oral Daily  . diltiazem  120 mg Oral Daily  . docusate sodium  100 mg Oral BID  . [START ON 12/18/2015] enoxaparin (LOVENOX) injection  40 mg Subcutaneous Q24H  . feeding supplement (ENSURE ENLIVE)  237 mL Oral Q24H  . feeding supplement (PRO-STAT SUGAR FREE 64)  30 mL Oral Daily  . ferrous sulfate  325 mg Oral TID PC  . hydrocortisone sod succinate (SOLU-CORTEF) inj  50 mg Intravenous Q12H  . insulin aspart  0-9 Units Subcutaneous TID WC  . insulin  glargine  10 Units Subcutaneous QHS  . magnesium oxide  400 mg Oral Daily  . mometasone-formoterol  2 puff Inhalation BID  . pantoprazole  40 mg Oral Daily  . potassium chloride SA  20 mEq Oral Daily  . senna  1 tablet Oral BID  . tiotropium  1 capsule Inhalation Daily   Continuous Infusions: . sodium chloride       LOS: 1 day    Time spent: >30 MINS    [facility]  [facility] Pager [phone]. If 7PM-7AM, please contact night-coverage at [email or web address], password [facility] 12/17/2015, 3:48 PM  LOS: 1 day

## 2015-12-17 NOTE — Transfer of Care (Signed)
Immediate Anesthesia Transfer of Care Note  Patient: [name]  Procedure(s) Performed: Procedure(s): INTRAMEDULLARY (IM) NAIL FEMORAL (Right)  Patient Location: PACU  Anesthesia Type:MAC and Regional  Level of Consciousness: awake, alert , oriented and patient cooperative  Airway & Oxygen Therapy: Patient Spontanous Breathing and Patient connected to nasal cannula oxygen  Post-op Assessment: Report given to RN and Post -op Vital signs reviewed and stable  Post vital signs: Reviewed and stable  Last Vitals:  Vitals:   12/17/15 0554 12/17/15 1331  BP: (!) 154/77   Pulse: (!) 104   Resp: 18   Temp: 36.5 C (P) 36.6 C    Last Pain:  Vitals:   12/17/15 1331  TempSrc:   PainSc: (P) 0-No pain      Patients Stated Pain Goal: 3 (12/17/15 [id number])  Complications: No apparent anesthesia complications

## 2015-12-17 NOTE — Progress Notes (Signed)
Spoke with dr Denton ArHoderiene update given on bp o2 sats and spinal level ok to return to room

## 2015-12-17 NOTE — Anesthesia Procedure Notes (Signed)
Procedure Name: MAC Date/Time: 12/17/2015 11:28 AM Performed by: Tillman AbideHAWKINS, Cristopher Ciccarelli B Pre-anesthesia Checklist: Patient identified, Emergency Drugs available, Suction available and Patient being monitored Patient Re-evaluated:Patient Re-evaluated prior to inductionOxygen Delivery Method: Simple face mask

## 2015-12-18 ENCOUNTER — Inpatient Hospital Stay (HOSPITAL_COMMUNITY): Payer: Medicare Other

## 2015-12-18 ENCOUNTER — Encounter (HOSPITAL_COMMUNITY): Payer: Self-pay | Admitting: Orthopedic Surgery

## 2015-12-18 ENCOUNTER — Other Ambulatory Visit (HOSPITAL_COMMUNITY): Payer: Medicare Other

## 2015-12-18 DIAGNOSIS — I714 Abdominal aortic aneurysm, without rupture, unspecified: Secondary | ICD-10-CM

## 2015-12-18 DIAGNOSIS — D62 Acute posthemorrhagic anemia: Secondary | ICD-10-CM

## 2015-12-18 DIAGNOSIS — E119 Type 2 diabetes mellitus without complications: Secondary | ICD-10-CM

## 2015-12-18 DIAGNOSIS — S72141D Displaced intertrochanteric fracture of right femur, subsequent encounter for closed fracture with routine healing: Secondary | ICD-10-CM

## 2015-12-18 DIAGNOSIS — I10 Essential (primary) hypertension: Secondary | ICD-10-CM

## 2015-12-18 DIAGNOSIS — L8990 Pressure ulcer of unspecified site, unspecified stage: Secondary | ICD-10-CM

## 2015-12-18 DIAGNOSIS — Z794 Long term (current) use of insulin: Secondary | ICD-10-CM

## 2015-12-18 DIAGNOSIS — S72141A Displaced intertrochanteric fracture of right femur, initial encounter for closed fracture: Principal | ICD-10-CM

## 2015-12-18 LAB — GLUCOSE, CAPILLARY
GLUCOSE-CAPILLARY: 150 mg/dL — AB (ref 65–99)
GLUCOSE-CAPILLARY: 156 mg/dL — AB (ref 65–99)
GLUCOSE-CAPILLARY: 112 mg/dL — AB (ref 65–99)
Glucose-Capillary: 177 mg/dL — ABNORMAL HIGH (ref 65–99)
Glucose-Capillary: 150 mg/dL — ABNORMAL HIGH (ref 65–99)

## 2015-12-18 LAB — HEMOGLOBIN AND HEMATOCRIT, BLOOD
HEMATOCRIT: 29.3 % — AB (ref 36.0–46.0)
Hemoglobin: 9 g/dL — ABNORMAL LOW (ref 12.0–15.0)

## 2015-12-18 LAB — COMPREHENSIVE METABOLIC PANEL
ALK PHOS: 37 U/L — AB (ref 38–126)
ALT: 11 U/L — ABNORMAL LOW (ref 14–54)
ANION GAP: 4 — AB (ref 5–15)
AST: 11 U/L — ABNORMAL LOW (ref 15–41)
Albumin: 2.7 g/dL — ABNORMAL LOW (ref 3.5–5.0)
BILIRUBIN TOTAL: 0.3 mg/dL (ref 0.3–1.2)
BUN: 27 mg/dL — ABNORMAL HIGH (ref 6–20)
CALCIUM: 8.5 mg/dL — AB (ref 8.9–10.3)
CO2: 29 mmol/L (ref 22–32)
Chloride: 104 mmol/L (ref 101–111)
Creatinine, Ser: 1.04 mg/dL — ABNORMAL HIGH (ref 0.44–1.00)
GFR calc Af Amer: 56 mL/min — ABNORMAL LOW (ref >60)
GFR, EST NON AFRICAN AMERICAN: 48 mL/min — AB (ref >60)
Glucose, Bld: 167 mg/dL — ABNORMAL HIGH (ref 65–99)
POTASSIUM: 4.5 mmol/L (ref 3.5–5.1)
Sodium: 137 mmol/L (ref 135–145)
TOTAL PROTEIN: 4.2 g/dL — AB (ref 6.5–8.1)

## 2015-12-18 LAB — BLOOD GAS, ARTERIAL
Acid-Base Excess: 2.8 mmol/L — ABNORMAL HIGH (ref 0.0–2.0)
BICARBONATE: 27.3 meq/L — AB (ref 20.0–24.0)
DRAWN BY: 419771
FIO2: 40
O2 SAT: 98.7 %
Patient temperature: 98.6
TCO2: 28.7 mmol/L (ref 0–100)
pCO2 arterial: 45.5 mmHg — ABNORMAL HIGH (ref 35.0–45.0)
pH, Arterial: 7.395 (ref 7.350–7.450)
pO2, Arterial: 117 mmHg — ABNORMAL HIGH (ref 80.0–100.0)

## 2015-12-18 LAB — CBC
HEMATOCRIT: 21.5 % — AB (ref 36.0–46.0)
HEMOGLOBIN: 6.6 g/dL — AB (ref 12.0–15.0)
MCH: 26.9 pg (ref 26.0–34.0)
MCHC: 30.7 g/dL (ref 30.0–36.0)
MCV: 87.8 fL (ref 78.0–100.0)
Platelets: 148 10*3/uL — ABNORMAL LOW (ref 150–400)
RBC: 2.45 MIL/uL — ABNORMAL LOW (ref 3.87–5.11)
RDW: 14.5 % (ref 11.5–15.5)
WBC: 10.1 10*3/uL (ref 4.0–10.5)

## 2015-12-18 LAB — PREPARE RBC (CROSSMATCH)

## 2015-12-18 MED ORDER — FUROSEMIDE 10 MG/ML IJ SOLN
INTRAMUSCULAR | Status: AC
  Filled 2015-12-18: qty 4

## 2015-12-18 MED ORDER — SODIUM CHLORIDE 0.9 % IV SOLN
INTRAVENOUS | Status: AC
  Administered 2015-12-18: 03:00:00 via INTRAVENOUS

## 2015-12-18 MED ORDER — SODIUM CHLORIDE 0.9 % IV SOLN
Freq: Once | INTRAVENOUS | Status: AC
  Administered 2015-12-18: 05:00:00 via INTRAVENOUS

## 2015-12-18 MED ORDER — FUROSEMIDE 10 MG/ML IJ SOLN
20.0000 mg | Freq: Once | INTRAMUSCULAR | Status: AC
  Administered 2015-12-18: 20 mg via INTRAVENOUS

## 2015-12-18 MED ORDER — SODIUM CHLORIDE 0.9 % IV BOLUS (SEPSIS)
500.0000 mL | Freq: Once | INTRAVENOUS | Status: AC
  Administered 2015-12-18: 500 mL via INTRAVENOUS

## 2015-12-18 MED ORDER — IOPAMIDOL (ISOVUE-370) INJECTION 76%
100.0000 mL | Freq: Once | INTRAVENOUS | Status: AC | PRN
  Administered 2015-12-16: 100 mL via INTRAVENOUS

## 2015-12-18 MED ORDER — ACETAMINOPHEN 500 MG PO TABS
500.0000 mg | ORAL_TABLET | Freq: Three times a day (TID) | ORAL | Status: DC
  Administered 2015-12-18 – 2015-12-20 (×7): 500 mg via ORAL
  Filled 2015-12-18 (×7): qty 1

## 2015-12-18 NOTE — Progress Notes (Addendum)
Patient c/o of having pain in her surgical site 2mg  of morphine was given, afterwards patient complained of being SOB, Vitals signs were obtained O2 sat  on 2LO2 was in the 80s' patient was instructed to do pursed lip breathing, head of the bed elevated oxygen increased to 4L/min, still no improvement, rapid response was called and interventions were provided. Patient now resting quietly in bed, vital signs stable, continuous pulse ox in place, 02 4L/min via nasal cannula being delivered will continue to monitor.

## 2015-12-18 NOTE — Progress Notes (Signed)
CRITICAL VALUE ALERT  Critical value received:  Hemoglobin 6.6  Date of notification:  12/18/2015  Time of notification:  0335  Critical value read back: yes  Nurse who received alert: Mitzi DavenportFola RN  MD notified (1st page):   Donnamarie PoagKirby K MD  Time of first page:   [phone] MD notified (2nd page):  Time of second page:  Responding MD:    Time MD responded:

## 2015-12-18 NOTE — Progress Notes (Signed)
Patient ID: Randa Lynntta S Chahal, female   DOB: 10-16-1929, 82 y.o.   MRN: [id number]     Subjective:  Patient reports pain as mild.  Patient reports great improvement in her pain.  She denies any CP or SOB  Objective:   VITALS:   Vitals:   12/18/15 0419 12/18/15 0445 12/18/15 0728 12/18/15 0929  BP: (!) 120/53 (!) 118/48 121/70 117/76  Pulse: 76 95 69 71  Resp: 18 18 18 19   Temp: 97.9 F (36.6 C) 97.9 F (36.6 C) 97.5 F (36.4 C) 97.9 F (36.6 C)  TempSrc: Axillary Axillary Oral Oral  SpO2: 100%  100% 100%  Weight:      Height:        ABD soft Sensation intact distally Dorsiflexion/Plantar flexion intact Incision: dressing C/D/I and moderate drainage Dressing over wrapped will plan change tomorrow  Lab Results  Component Value Date   WBC 10.1 12/18/2015   HGB 6.6 (LL) 12/18/2015   HCT 21.5 (L) 12/18/2015   MCV 87.8 12/18/2015   PLT 148 (L) 12/18/2015   BMET    Component Value Date/Time   NA 137 12/18/2015 0303   K 4.5 12/18/2015 0303   CL 104 12/18/2015 0303   CO2 29 12/18/2015 0303   GLUCOSE 167 (H) 12/18/2015 0303   BUN 27 (H) 12/18/2015 0303   CREATININE 1.04 (H) 12/18/2015 0303   CREATININE 0.99 (H) 04/17/2015 0937   CALCIUM 8.5 (L) 12/18/2015 0303   GFRNONAA 48 (L) 12/18/2015 0303   GFRAA 56 (L) 12/18/2015 0303     Assessment/Plan: 1 Day Post-Op   Principal Problem:   Intertrochanteric fracture of right hip (HCC) Active Problems:   Hypertension   Aneurysm, aortic (HCC)   Syncope   Carotid artery disease (HCC)   Atrial fibrillation (HCC)   Insulin dependent type 2 diabetes mellitus (HCC)   Pressure ulcer   Advance diet Up with therapy WBAT Continue plan per medicine Dy dressing PRN   Haskel KhanDOUGLAS PARRY, BRANDON 12/18/2015, 9:46 AM  Discussed and agree with above.  ABLA will transfuse.    Teryl LucyJoshua Akita Maxim, MD Cell [phone]

## 2015-12-18 NOTE — Progress Notes (Signed)
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 She has a right intratrochanteric fracture. Orthopedic surgery, Dr. Osie Bond has seen her and requested a consultation given her vast cardiovascular history \    Assessment & Plan:   Principal Problem:   Intertrochanteric fracture of right hip (HCC) Active Problems:   Postoperative anemia due to acute blood loss   Hypertension   Aneurysm, aortic (HCC)   Syncope   Carotid artery disease (HCC)   Atrial fibrillation (HCC)   Insulin dependent type 2 diabetes mellitus (HCC)   Pressure ulcer  #1 intertrochanteric fracture of the right hip Secondary to mechanical fall. Status post I am nail to right femoral per Dr. Dion Saucier orthopedics. PT/OT. Place on scheduled Tylenol. Patient likely needs skilled nursing facility placement once medically stable. Per orthopedics.  #2 postop anemia due to acute blood loss Hemoglobin currently at 6.6 this morning. Patient with no overt bleeding. Patient status post 2 units packed red blood cells hemoglobin currently at 9.0. Anemia panel pending. Transfusion threshold hemoglobin less than 7. Follow H&H.  #3 hypertension Patient's blood pressure has been running low and a such diuretics were held. Currently on Cardizem and dig oxygen. Follow.  #4 insulin-dependent type 2 diabetes Hemoglobin A1c was 5.0. CBGs have ranged from 150 -277. Continue current regimen of Lantus, sliding scale insulin.  #5 aortic root  aneurysm CT angiogram chest negative for intrathoracic aortic aneurysm. Follow.  #6 carotid artery disease status post endarterectomy,  Doppler, 2011 stable, 0-39% R ICA, 40-59% LICA cardiac enzymes negative.  #7 history of atrial fibrillation Continue digoxin and Cardizem for rate control. Patient on anticoagulation candidate due to increased risk of falls.  # 8 COPD exacerbation  Some clinical improvement. Patient with no wheezing noted on anterior lung examination. Patient with no increased work of breathing. COPD exacerbation improving. Continue stress dose steroids IV Solu-Cortef and taper, continue PPI,Dulera, Spiriva. Nebs as needed.  #9 diastolic CHF Compensated. Lasix on hold due to low blood pressure. Follow closely.        DVT prophylaxis: Lovenox Code Status: Full Family Communication: Updated patient. No family at bedside. Disposition Plan: Likely skilled nursing facility when medically stable.   Consultants:   Cardiology: Dr. Anne Fu 12/16/2015  Orthopedics: Dr. Dion Saucier 12/17/2015  Procedures:   CT head /CT C-spine 12/16/2015  Chest x-ray 12/18/2015, 12/16/2015  CT angiogram chest/abdomen/pelvis 12/16/2015  Plain films right femur 12/17/2015  Plain films right hip and pelvis 12/16/2015  Intramedullary nail right femoral per Dr. Dion Saucier 12/17/2015  2 units packed red blood cells 12/18/2015  Antimicrobials:   None   Subjective: Patient complaining of right hip pain however hesitant to take any pain medications that she states she doesn't want to get addicted to pain medications. Patient denies any chest pain. No shortness of breath.  Objective: Vitals:   12/18/15 0946 12/18/15 1215 12/18/15 1247 12/18/15 1355  BP: 110/64 (!) 145/77 123/61 (!) 109/53  Pulse: 91 (!) 105 80 73  Resp: 19  14 18   Temp: 97.5 F (36.4 C)  97.9 F (36.6 C) 97.9 F (36.6 C)  TempSrc: Oral  Oral Oral  SpO2:  96% 97% 95%  Weight:      Height:        Intake/Output  Summary (Last 24 hours) at 12/18/15 1727 Last data filed at 12/18/15 1247  Gross per 24 hour  Intake          1099.67 ml  Output              125 ml  Net           974.67 ml   Filed Weights   12/16/15 1804 12/16/15 2114  Weight: 77.1 kg (170 lb) 64.7 kg (142 lb 11.2 oz)    Examination:  General exam: Appears calm and comfortable  Respiratory system: Clear to auscultation anterior lung fields. Respiratory effort normal. Cardiovascular system: S1 & S2 heard, RRR. No JVD, murmurs, rubs, gallops or clicks. No pedal edema. Gastrointestinal system: Abdomen is nondistended, soft and nontender. No organomegaly or masses felt. Normal bowel sounds heard. Central nervous system: Alert and oriented. No focal neurological deficits. Extremities: Symmetric 5 x 5 power. Skin: No rashes, lesions or ulcers Psychiatry: Judgement and insight appear normal. Mood & affect appropriate.     Data Reviewed: I have personally reviewed following labs and imaging studies  CBC:  Recent Labs Lab 12/16/15 1548 12/16/15 1835 12/17/15 0537 12/18/15 0303 12/18/15 1355  WBC 11.6* 14.3* 11.8* 10.1  --   NEUTROABS 10.8*  --   --   --   --   HGB 10.5* 10.7* 9.8* 6.6* 9.0*  HCT 34.5* 34.9* 31.8* 21.5* 29.3*  MCV 88.5 87.9 87.6 87.8  --   PLT 185 202 205 148*  --    Basic Metabolic Panel:  Recent Labs Lab 12/16/15 1548 12/16/15 1835 12/17/15 0537 12/18/15 0303  NA 137  --  136 137  K 4.1  --  4.4 4.5  CL 105  --  101 104  CO2 25  --  29 29  GLUCOSE 189*  --  144* 167*  BUN 23*  --  23* 27*  CREATININE 0.85 0.86 0.88 1.04*  CALCIUM 9.9  --  9.9 8.5*   GFR: Estimated Creatinine Clearance: 41.1 mL/min (by C-G formula based on SCr of 1.04 mg/dL). Liver Function Tests:  Recent Labs Lab 12/16/15 1835 12/18/15 0303  AST 15 11*  ALT 14 11*  ALKPHOS 48 37*  BILITOT 0.7 0.3  PROT 5.7* 4.2*  ALBUMIN 3.4* 2.7*   No results for input(s): LIPASE, AMYLASE in the last 168 hours. No results for  input(s): AMMONIA in the last 168 hours. Coagulation Profile:  Recent Labs Lab 12/17/15 0537  INR 1.02   Cardiac Enzymes:  Recent Labs Lab 12/16/15 1835 12/17/15 0009 12/17/15 0537  TROPONINI <0.03 <0.03 <0.03   BNP (last 3 results) No results for input(s): PROBNP in the last 8760 hours. HbA1C:  Recent Labs  12/16/15 1854  HGBA1C 5.0   CBG:  Recent Labs Lab 12/17/15 2124 12/18/15 0140 12/18/15 0620 12/18/15 1108 12/18/15 1624  GLUCAP 165* 177* 150* 150* 156*   Lipid Profile: No results for input(s): CHOL, HDL, LDLCALC, TRIG, CHOLHDL, LDLDIRECT in the last 72 hours. Thyroid Function Tests:  Recent Labs  12/17/15 0009  TSH 1.320   Anemia Panel: No results for input(s): VITAMINB12, FOLATE, FERRITIN, TIBC, IRON, RETICCTPCT in the last 72 hours. Sepsis Labs: No results for input(s): PROCALCITON, LATICACIDVEN in the last 168 hours.  Recent Results (from the past 240 hour(s))  Surgical pcr screen  Status: None   Collection Time: 12/16/15 10:47 PM  Result Value Ref Range Status   MRSA, PCR NEGATIVE NEGATIVE Final   Staphylococcus aureus NEGATIVE NEGATIVE Final    Comment:        The Xpert SA Assay (FDA approved for NASAL specimens in patients over 27 years of age), is one component of a comprehensive surveillance program.  Test performance has been validated by [facility] for patients greater than or equal to 37 year old. It is not intended to diagnose infection nor to guide or monitor treatment.          Radiology Studies: Dg Chest 1 View  Result Date: 12/16/2015 CLINICAL DATA:  Right hip fracture.  Fell today. EXAM: CHEST 1 VIEW COMPARISON:  12/16/2015.  Rash that earlier today. FINDINGS: Enlarged cardiac silhouette with post CABG changes. The pulmonary vasculature is prominent. Continued evidence of bilateral pleural fluid, greater on the right compared to the left. Probable calcified granuloma in the right lower lung zone. Diffuse  osteopenia. Atheromatous arterial calcifications, including the thoracic aorta. IMPRESSION: 1. Pulmonary vascular congestion and mild cardiomegaly. 2. Small bilateral pleural effusions. 3. Aortic atherosclerosis. Electronically Signed   By: Beckie Salts M.D.   On: 12/16/2015 20:12   Dg Tibia/fibula Right  Result Date: 12/16/2015 CLINICAL DATA:  Large right lower leg hematoma superior and medial to the midportion of the lower leg after a fall today. Right hip fracture. EXAM: RIGHT TIBIA AND FIBULA - 2 VIEW COMPARISON:  Right hip radiographs obtained earlier today. FINDINGS: The frontal view is oblique and does not include the distal portion of the lower leg. The lateral view does not include the proximal portion of the lower leg. Diffuse osteopenia is demonstrated as well as atheromatous arterial calcifications. No fracture or dislocation is seen on these views. IMPRESSION: Limited examination with no fracture or dislocation seen. A routine two-view examination is recommended when possible. Electronically Signed   By: Beckie Salts M.D.   On: 12/16/2015 20:10   Pelvis Portable  Result Date: 12/17/2015 CLINICAL DATA:  Postop day 0 ORIF intertrochanteric right femoral neck fracture with intramedullary nail. EXAM: PORTABLE PELVIS 1-2 VIEWS 1:57 p.m.: COMPARISON:  Intraoperative right femur x-rays earlier today at 12:45 p.m. Right hip x-rays 12/16/2015. FINDINGS: ORIF of the intertrochanteric right femoral neck fracture with an intramedullary nail. Alignment appears near anatomic in the AP projection. Generalized osseous demineralization. Prior left hip arthroplasty with anatomic alignment. IMPRESSION: Near anatomic alignment in the AP projection post ORIF of the intertrochanteric right femoral neck fracture. Electronically Signed   By: Hulan Saas M.D.   On: 12/17/2015 14:13   Dg Chest Port 1 View  Result Date: 12/18/2015 CLINICAL DATA:  Shortness of breath tonight. EXAM: PORTABLE CHEST 1 VIEW  COMPARISON:  Chest radiographs 12/16/2015. FINDINGS: Patient is post median sternotomy. Cardiomegaly with tortuous atherosclerotic thoracic aorta, unchanged. Vascular congestion and cephalization of pulmonary vasculature, again seen. Bilateral pleural effusions, possible increase on the right. Associated hazy opacity at the lung bases likely atelectasis. Right upper lobe calcified granulomas versus scarring. No new move IMPRESSION: Bilateral pleural effusions, with increased pleural fluid on the right. Mild cardiomegaly and vascular congestion, stable. Electronically Signed   By: Rubye Oaks M.D.   On: 12/18/2015 02:25   Ct Angio Chest/abd/pel For Dissection W And/or W/wo  Result Date: 12/16/2015 CLINICAL DATA:  Larey Seat today shortness of breath. Tachypnea. Diffusely decreased breath sounds. History of abdominal aortic aneurysm and COPD. Clinical concern for aortic dissection. EXAM: CT ANGIOGRAPHY  CHEST, ABDOMEN AND PELVIS TECHNIQUE: Multidetector CT imaging through the chest, abdomen and pelvis was performed using the standard protocol during bolus administration of intravenous contrast. Multiplanar reconstructed images and MIPs were obtained and reviewed to evaluate the vascular anatomy. CONTRAST:  100 cc Isovue 370 COMPARISON:  Abdomen and pelvis CT examinations dated 11/18/2014 and 05/02/2014. Chest radiographs dated 12/16/2015. FINDINGS: CTA CHEST FINDINGS Cardiovascular: Extensive atheromatous calcifications, including the thoracic aorta and coronary arteries. No aneurysm or dissection seen. Mediastinum:  No enlarged lymph nodes. Lungs and pleura: Small to moderate-sized right pleural effusion and small left pleural effusion. Associated mild right lower lobe atelectasis and minimal left lower lobe atelectasis. Diffuse bullous changes throughout both lungs. Biapical pleural and parenchymal scarring. 4 mm right upper lobe nodule on image number 32 of series 506. Musculoskeletal:  Thoracic spine  degenerative changes. Review of the MIP images confirms the above findings. CTA ABDOMEN AND PELVIS FINDINGS Vascular: Atheromatous arterial calcifications. Ectasia of the infrarenal abdominal aorta with a maximum diameter of the 2.9 cm. This previously measured 3.2 cm. No dissection. Hepatobiliary: Cholecystectomy clips. Interval biliary air. No significant change in a small probable cyst in the anterior aspect of the left lobe of the liver, measuring 4 mm on image number 143 of series 501. Pancreas:  Air in the common duct.  Mild diffuse pancreatic atrophy. Spleen:  Small cysts. Adrenal/ urinary tract: Normal appearing adrenal glands. Interval small cortical indentation or angiomyolipoma in the lateral aspect of the lower pole of the left kidney. Unremarkable ureters and urinary bladder. Gastrointestinal: Multiple colonic diverticula. No gastric or small bowel abnormalities. Normal appearing appendix. Lymph nodes:  No enlarged lymph nodes. Other:  Tiny umbilical hernia containing fat. Musculoskeletal: Left hip prosthesis with associated streak artifacts. Comminuted right intertrochanteric fracture with varus angulation and posterior angulation of the distal fragment. Mild anterior displacement of the distal fragment approximately 15% L1 vertebral compression deformity with minimal bony retropulsion. No acute fracture lines. Review of the MIP images confirms the above findings. IMPRESSION: 1. No aortic dissection. 2. Ectatic infrarenal abdominal aorta with a maximum diameter of 2.9 cm. 3. Aortic atherosclerosis and coronary artery atherosclerosis. 4. Small to moderate-sized right pleural effusion and small left pleural effusion. 5. 4 mm right upper lobe nodule. 6. COPD. 7. Interval biliary air. This could be due to recent passage of a common duct stone. 8. Extensive colonic diverticulosis. 9. Comminuted right intertrochanteric fracture with varus and posterior angulation of the distal fragment. 10. Old 50% L1  vertebral compression fracture. Electronically Signed   By: Beckie SaltsSteven  Reid M.D.   On: 12/16/2015 19:53   Dg Femur, Min 2 Views Right  Result Date: 12/17/2015 CLINICAL DATA:  Right femoral fracture EXAM: RIGHT FEMUR 2 VIEWS COMPARISON:  None. FLUOROSCOPY TIME:  Radiation Exposure Index (as provided by the fluoroscopic device): Not available If the device does not provide the exposure index: Fluoroscopy Time:  1 minutes 31 seconds Number of Acquired Images:  4 FINDINGS: Medullary rod is noted in the right femur. Proximal and distal fixation screws are noted. The intratrochanteric fracture is in near anatomic alignment. IMPRESSION: Status post ORIF of proximal right femoral fracture Electronically Signed   By: Alcide CleverMark  Lukens M.D.   On: 12/17/2015 13:40        Scheduled Meds: . acetaminophen  500 mg Oral TID  . atorvastatin  20 mg Oral q1800  . digoxin  0.0625 mg Oral Daily  . diltiazem  120 mg Oral Daily  . docusate sodium  100 mg  Oral BID  . enoxaparin (LOVENOX) injection  40 mg Subcutaneous Q24H  . feeding supplement (ENSURE ENLIVE)  237 mL Oral Q24H  . feeding supplement (PRO-STAT SUGAR FREE 64)  30 mL Oral Daily  . ferrous sulfate  325 mg Oral TID PC  . furosemide      . hydrocortisone sod succinate (SOLU-CORTEF) inj  50 mg Intravenous Q8H  . insulin aspart  0-9 Units Subcutaneous TID WC  . insulin glargine  10 Units Subcutaneous QHS  . magnesium oxide  400 mg Oral Daily  . mometasone-formoterol  2 puff Inhalation BID  . pantoprazole  40 mg Oral Daily  . potassium chloride SA  20 mEq Oral Daily  . senna  1 tablet Oral BID  . tiotropium  1 capsule Inhalation Daily   Continuous Infusions:    LOS: 2 days    Time spent: 35 minutes    Dejanira Pamintuan, MD [facility] Pager [phone]  If 7PM-7AM, please contact night-coverage [email or web address] Password [facility] 12/18/2015, 5:27 PM

## 2015-12-18 NOTE — Progress Notes (Signed)
Critical lab value received for hemoglobin 6.6, Kirby MD notified, orders received to transmit blood will continue to monitor

## 2015-12-18 NOTE — Evaluation (Signed)
Physical Therapy Evaluation Patient Details Name: Randa Lynntta S Hoskinson MRN: [id number] DOB: 09-08-1929 Today's Date: 12/18/2015   History of Present Illness  82 yo admitted after fall with Right femur fx s/p IM nail . PMHx: CABG, HTN, COPD, aFib, CHF, DM, CAD  Clinical Impression  Pt pleasant but unaware of fall, surgery, or date with decreased memory and carry over. Pt with sats 100% at rest on 4L and able to maintain 96% on 2L.With transition to sitting pt sats dropped to 80% requiring 8L venturi to recover to 94% with cues for pursed lip breathing and HR up to 140 with sitting. Pt able to recover sats and decrease HR to 105 with seated rest. Performed 2 standing trials on venturi with HR again elevated to 135 and sats 85% with exertion. Returned pt to bed due to cardiopulmonary status with pt 98% on 3L end of session and HR 108. BP 117/77 EOB.  Pt with decreased activity tolerance, strength, mobility, transfers and function who will benefit from acute therapy to maximize mobility, function and independence to decrease burden of care. RN aware of all activity and pt returned to bed.     Follow Up Recommendations Supervision/Assistance - 24 hour;SNF    Equipment Recommendations  None recommended by PT    Recommendations for Other Services       Precautions / Restrictions Precautions Precautions: Fall Precaution Comments: watch HR and O2 Restrictions Weight Bearing Restrictions: Yes RLE Weight Bearing: Weight bearing as tolerated      Mobility  Bed Mobility Overal bed mobility: Needs Assistance;+2 for physical assistance Bed Mobility: Supine to Sit;Sit to Supine     Supine to sit: Min assist;HOB elevated Sit to supine: Mod assist;+2 for physical assistance   General bed mobility comments: min assist for RLE to pivot to EOB with cues for sequence, use of rail and HOB elevated to pivot to EOB. With return to bed 2 person mod assist to pivot pt with max +2 to sc[facility]  Transfers Overall transfer level: Needs assistance   Transfers: Sit to/from Stand Sit to Stand: Max assist;+2 physical assistance;From elevated surface         General transfer comment: bil knees blocked with cues for hand placement and anterior translation with use of belt and pad for pt to stand. Attempted x 2 trials with and without RW, without pt able to have more assist and achieve fully upright for grossly 1 min. Did not perform transfer to chair at this time due to pt cardiopulmonary status  Ambulation/Gait                Stairs            Wheelchair Mobility    Modified Rankin (Stroke Patients Only)       Balance Overall balance assessment: Needs assistance   Sitting balance-Leahy Scale: Fair       Standing balance-Leahy Scale: Poor                               Pertinent Vitals/Pain Pain Location: right hip, pt unable to rate, reports pain with activity and minimal at rest Pain Intervention(s): Limited activity within patient's tolerance;Repositioned    Home Living Family/patient expects to be discharged to:: Private residence Living Arrangements: Alone Available Help at Discharge: Family;Available PRN/intermittently Type of Home: House Home Access: Ramped entrance     Home Layout: One level Home Equipment: Cane - single point;Walker -  2 wheels;Bedside commode;Shower seat Additional Comments: family assists with meals and cleaning    Prior Function Level of Independence: Independent with assistive device(s)               Hand Dominance        Extremity/Trunk Assessment   Upper Extremity Assessment: Defer to OT evaluation           Lower Extremity Assessment: RLE deficits/detail RLE Deficits / Details: pt required assist to move RLE throughout session post op       Communication   Communication: No difficulties  Cognition Arousal/Alertness: Awake/alert Behavior During Therapy: WFL for tasks  assessed/performed Overall Cognitive Status: No family/caregiver present to determine baseline cognitive functioning       Memory: Decreased short-term memory (Pt disoriented to situation, year, month, decreased STM and problem solving)              General Comments      Exercises        Assessment/Plan    PT Assessment Patient needs continued PT services  PT Diagnosis Difficulty walking;Generalized weakness;Acute pain;Altered mental status   PT Problem List Decreased strength;Decreased mobility;Decreased safety awareness;Decreased activity tolerance;Decreased range of motion;Pain;Decreased balance;Decreased knowledge of use of DME  PT Treatment Interventions DME instruction;Gait training;Functional mobility training;Balance training;Therapeutic exercise;Therapeutic activities;Patient/family education   PT Goals (Current goals can be found in the Care Plan section) Acute Rehab PT Goals Patient Stated Goal: be able to walk and return home PT Goal Formulation: With patient Time For Goal Achievement: 01/01/16 Potential to Achieve Goals: Fair    Frequency Min 3X/week   Barriers to discharge Decreased caregiver support      Co-evaluation PT/OT/SLP Co-Evaluation/Treatment: Yes Reason for Co-Treatment: Complexity of the patient's impairments (multi-system involvement);For patient/therapist safety PT goals addressed during session: Mobility/safety with mobility;Balance;Proper use of DME         End of Session Equipment Utilized During Treatment: Gait belt;Oxygen Activity Tolerance: Patient tolerated treatment well Patient left: in bed;with call bell/phone within reach;with bed alarm set Nurse Communication: Mobility status;Precautions;Weight bearing status         Time: [id number] PT Time Calculation (min) (ACUTE ONLY): 30 min   Charges:   PT Evaluation $PT Eval Moderate Complexity: 1 Procedure     PT G CodesDelorse Lek:        Tabor, Sandy Blouch Beth 12/18/2015, 12:21  PM Delaney MeigsMaija Tabor Pilar Corrales, PT [phone]

## 2015-12-18 NOTE — [place] FL2 (Signed)
[place] MEDICAID FL2 LEVEL OF CARE SCREENING TOOL     IDENTIFICATION  Patient Name: [name] Birthdate: 08/20/1929 Sex: female Admission Date (Current Location): 12/16/2015  [facility] and [place] Number:  [employer] and Address:  [facility] [place], [address] [place], [place] [address]      Provider Number: [id number]  Attending Physician Name and Address:  Rodolph Bong, MD  Relative Name and Phone Number:       Current Level of Care: Hospital Recommended Level of Care: Skilled Nursing Facility Prior Approval Number:    Date Approved/Denied:   PASRR Number: [id number]  Discharge Plan: SNF    Current Diagnoses: Patient Active Problem List   Diagnosis Date Noted  . Pressure ulcer 12/17/2015  . Intertrochanteric fracture of right hip (HCC) 12/16/2015  . DNR (do not resuscitate) 09/09/2015  . Altered mental status 05/01/2015  . AKI (acute kidney injury) (HCC)   . Coronary artery disease involving native coronary artery of native heart with angina pectoris (HCC)   . Chronic diastolic congestive heart failure (HCC)   . Insulin dependent type 2 diabetes mellitus (HCC)   . HCAP (healthcare-associated pneumonia) 03/18/2015  . Bile duct obstruction   . Elevated LFTs   . Elevated troponin 11/19/2014  . SOB (shortness of breath)   . Transaminitis 11/18/2014  . Pancreatic lesion 11/18/2014  . Sepsis (HCC) 05/04/2014  . UTI (lower urinary tract infection) 05/04/2014  . Atrial fibrillation with RVR (HCC) 05/04/2014  . Atrial fibrillation with rapid ventricular response (HCC)   . Pyelonephritis   . Bronchitis, chronic obstructive w acute bronchitis (HCC) 02/02/2014  . COPD exacerbation (HCC) 02/01/2014  . Dyspnea 11/15/2013  . Atrial fibrillation with tachycardic ventricular rate (HCC) 11/15/2013  . Peripheral arterial disease (HCC) 10/17/2013  . Atypical chest pain 02/08/2013  . Cellulitis, leg 09/28/2012  . Leg ulcer (HCC)    . CAD (coronary artery disease)   . Hematoma   . Mitral regurgitation   . Chronic diastolic CHF (congestive heart failure) (HCC) 07/20/2012  . Anemia 07/20/2012  . Intermediate coronary syndrome (HCC) 07/18/2012  . Acute exacerbation of chronic obstructive pulmonary disease (COPD) (HCC) 02/15/2012  . Dehydration 02/12/2012  . Alkalosis, metabolic 02/12/2012  . Generalized weakness 02/11/2012  . Atrial fibrillation (HCC)   . Chronic respiratory failure (HCC) 07/29/2011  . H/O steroid therapy 07/28/2011  . Edema   . Aortic valve sclerosis   . Hypertension   . Hyperlipidemia   . GI bleed   . Aneurysm, aortic (HCC)   . Syncope   . Hyponatremia   . COPD (chronic obstructive pulmonary disease) (HCC)   . Tobacco abuse   . Bradycardia   . Hx of CABG   . Ejection fraction   . Carotid artery disease (HCC)   . Tuberculosis   . OXYGEN-USE OF SUPPLEMENTAL 08/02/2009    Orientation RESPIRATION BLADDER Height & Weight     Self, Place  O2 (3 L/min) Indwelling catheter Weight: 142 lb 11.2 oz (64.7 kg) (weighed on zeroed out bed) Height:  6' (182.9 cm)  BEHAVIORAL SYMPTOMS/MOOD NEUROLOGICAL BOWEL NUTRITION STATUS     (none) Incontinent Diet (Clear liquid)  AMBULATORY STATUS COMMUNICATION OF NEEDS Skin   Extensive Assist Verbally PU Stage and Appropriate Care, Surgical wounds (Incision Closed: RT Thigh, RT Hip. Skin tear RT Elbow. RT temporal Hematoma. )  Personal Care Assistance Level of Assistance  Bathing, Feeding, Dressing Bathing Assistance: Limited assistance Feeding assistance: Limited assistance Dressing Assistance: Limited assistance     Functional Limitations Info  Sight, Hearing, Speech Sight Info: Adequate Hearing Info: Adequate Speech Info: Adequate    SPECIAL CARE FACTORS FREQUENCY  PT (By licensed PT), OT (By licensed OT)     PT Frequency: 5/ week OT Frequency: 5/ week            Contractures Contractures Info: Not present     Additional Factors Info  Code Status, Allergies, Insulin Sliding Scale Code Status Info: Full Allergies Info: Other, Codeine, Sulfonamide Derivatives, Warfarin, Penicillins, Ranitidine Hcl   Insulin Sliding Scale Info: insulin aspart (novoLOG) injection 0-9 Units Dose: 0-9 Units Freq: 3 times daily with meals Route: [place]       Current Medications (12/18/2015):  This is the current hospital active medication list Current Facility-Administered Medications  Medication Dose Route Frequency Provider Last Rate Last Dose  . acetaminophen (TYLENOL) tablet 650 mg  650 mg Oral Q6H PRN Teryl LucyJoshua Parsa Rickett, MD       Or  . acetaminophen (TYLENOL) suppository 650 mg  650 mg Rectal Q6H PRN Teryl LucyJoshua Kazuto Sevey, MD      . acetaminophen (TYLENOL) tablet 500 mg  500 mg Oral TID Rodolph Bonganiel V Thompson, MD   500 mg at 12/18/15 1549  . albuterol (PROVENTIL) (2.5 MG/3ML) 0.083% nebulizer solution 2.5 mg  2.5 mg Nebulization Q6H PRN Teryl LucyJoshua Jisell Majer, MD      . albuterol (PROVENTIL) (2.5 MG/3ML) 0.083% nebulizer solution 3 mL  3 mL Inhalation Q4H PRN Richarda OverlieNayana Abrol, MD      . alum & mag hydroxide-simeth (MAALOX/MYLANTA) 200-200-20 MG/5ML suspension 30 mL  30 mL Oral Q4H PRN Teryl LucyJoshua Willette Mudry, MD      . atorvastatin (LIPITOR) tablet 20 mg  20 mg Oral q1800 Richarda OverlieNayana Abrol, MD   20 mg at 12/17/15 1731  . bisacodyl (DULCOLAX) suppository 10 mg  10 mg Rectal Daily PRN Teryl LucyJoshua Desarai Barrack, MD      . diazepam (VALIUM) tablet 2 mg  2 mg Oral Q6H PRN Richarda OverlieNayana Abrol, MD      . digoxin (LANOXIN) tablet 0.0625 mg  0.0625 mg Oral Daily Richarda OverlieNayana Abrol, MD   0.0625 mg at 12/18/15 0946  . diltiazem (CARDIZEM CD) 24 hr capsule 120 mg  120 mg Oral Daily Richarda OverlieNayana Abrol, MD   120 mg at 12/18/15 0843  . docusate sodium (COLACE) capsule 100 mg  100 mg Oral BID Teryl LucyJoshua Ysmael Hires, MD   100 mg at 12/18/15 [id number]  . enoxaparin (LOVENOX) injection 40 mg  40 mg Subcutaneous Q24H Teryl LucyJoshua Ajanee Buren, MD   40 mg at 12/18/15 0844  . feeding supplement (BOOST / RESOURCE BREEZE) liquid 1  Container  1 Container Oral BID PRN Teryl LucyJoshua Janeisha Ryle, MD      . feeding supplement (ENSURE ENLIVE) (ENSURE ENLIVE) liquid 237 mL  237 mL Oral Q24H Teryl LucyJoshua Saria Haran, MD   237 mL at 12/18/15 1530  . feeding supplement (PRO-STAT SUGAR FREE 64) liquid 30 mL  30 mL Oral Daily Teryl LucyJoshua Delia Sitar, MD   30 mL at 12/18/15 0844  . ferrous sulfate tablet 325 mg  325 mg Oral TID PC Teryl LucyJoshua Anaisabel Pederson, MD   325 mg at 12/18/15 1209  . furosemide (LASIX) 10 MG/ML injection           . HYDROcodone-acetaminophen (NORCO/VICODIN) 5-325 MG per tablet 1-2 tablet  1-2 tablet Oral Q6H PRN Teryl LucyJoshua Milen Lengacher, MD   2 tablet  at 12/17/15 1807  . hydrocortisone sodium succinate (SOLU-CORTEF) 100 MG injection 50 mg  50 mg Intravenous Q8H Richarda OverlieNayana Abrol, MD   50 mg at 12/18/15 1549  . insulin aspart (novoLOG) injection 0-9 Units  0-9 Units Subcutaneous TID WC Richarda OverlieNayana Abrol, MD   1 Units at 12/18/15 1208  . insulin glargine (LANTUS) injection 10 Units  10 Units Subcutaneous QHS Richarda OverlieNayana Abrol, MD   10 Units at 12/17/15 2144  . magnesium citrate solution 1 Bottle  1 Bottle Oral Once PRN Teryl LucyJoshua Peachie Barkalow, MD      . magnesium oxide (MAG-OX) tablet 400 mg  400 mg Oral Daily Richarda OverlieNayana Abrol, MD   400 mg at 12/18/15 0843  . menthol-cetylpyridinium (CEPACOL) lozenge 3 mg  1 lozenge Oral PRN Teryl LucyJoshua Sula Fetterly, MD       Or  . phenol (CHLORASEPTIC) mouth spray 1 spray  1 spray Mouth/Throat PRN Teryl LucyJoshua Tully Burgo, MD      . mometasone-formoterol [facility](DULERA) 200-5 MCG/ACT inhaler 2 puff  2 puff Inhalation BID Richarda OverlieNayana Abrol, MD   2 puff at 12/18/15 0734  . morphine 2 MG/ML injection 2 mg  2 mg Intravenous Q2H PRN Teryl LucyJoshua Miko Sirico, MD   2 mg at 12/18/15 0124  . nitroGLYCERIN (NITROSTAT) SL tablet 0.4 mg  0.4 mg Sublingual Q5 min PRN Teryl LucyJoshua Seena Face, MD      . ondansetron [facility](ZOFRAN) tablet 4 mg  4 mg Oral Q6H PRN Teryl LucyJoshua Asami Lambright, MD       Or  . ondansetron [facility](ZOFRAN) injection 4 mg  4 mg Intravenous Q6H PRN Teryl LucyJoshua Priest Lockridge, MD   4 mg at 12/18/15 0150  . pantoprazole (PROTONIX) EC tablet 40 mg  40  mg Oral Daily Richarda OverlieNayana Abrol, MD   40 mg at 12/18/15 0843  . polyethylene glycol (MIRALAX / GLYCOLAX) packet 17 g  17 g Oral Daily PRN Teryl LucyJoshua Landen Breeland, MD      . potassium chloride SA (K-DUR,KLOR-CON) CR tablet 20 mEq  20 mEq Oral Daily Richarda OverlieNayana Abrol, MD   20 mEq at 12/18/15 0843  . senna (SENOKOT) tablet 8.6 mg  1 tablet Oral BID Teryl LucyJoshua Aleila Syverson, MD   8.6 mg at 12/18/15 0843  . sorbitol 70 % solution 30 mL  30 mL Oral Daily PRN Richarda OverlieNayana Abrol, MD      . tiotropium (SPIRIVA) inhalation capsule 18 mcg  1 capsule Inhalation Daily Richarda OverlieNayana Abrol, MD   18 mcg at 12/18/15 [id number]     Discharge Medications: Please see discharge summary for a list of discharge medications.  Relevant Imaging Results:  Relevant Lab Results:   Additional Information SSN: [id number]  Reggy EyeLaShonda A Aycock, LCSW

## 2015-12-18 NOTE — Progress Notes (Signed)
Spoke with Dion SaucierLandau MD about a second order for 2 additional units of blood.  H e said not to give he just wanted the first 2 units transfuse which wone [facility] and completed this morning before my shift report, and I hung another one at 0930 that was completed at 1300.

## 2015-12-18 NOTE — Significant Event (Addendum)
Rapid Response Event Note  Overview: Time Called: 0130 Arrival Time: 0132    Initial Focused Assessment:  Called by Mitzi DavenportFola, RN for pt s/p right  hip surgery on 12/17/2015 now with O2 sats 82% on 3 l [place], c/o lightheadedness, decreased urine output.  On arrival to room pt lying in bed, responds to name, oriented to self and place.  Skin warm and dry, Offers no c/o pain , received MsO4 2 mg at 0124 for right hip pain.  Pt c/o SOB with bilateral breath sounds diminished throughout.  Jamie, RT at bedside placed pt on Venti mask 40%  With 02 sats  Improved to 95%. Cardiac monitor indicates Aftrial fib rate 110. Palpable radial and pedal pulses.   Pt's daily digoxin and cardizem were held perioperatively Pt c/o nausea.  Foley in place with 125cc/8 hrs amber urine. Right hip hydrocolloid dressing saturated with bloody drainage and reinforced by Mitzi DavenportFola, RN  Interventions: 12 lead EKG shows pt in atrial fib with RVR rate 102,  ABG results: 7.395 PCO2 45.5, PO2 117   PCXR: pending.  NS 500cc bolus given per Devonne DoughtyKaren Kirby,NP Zofran 4 mg IV given for nausea,  CBG obtained  177.  Continuous pulse ox applied  0230 repeat vital signs post IV fluid bolus:  116/50  HR 89 atrial fib  RR 18 with pulse ox 100% on 40% Venti mask..  Decreased O2 to 4l [place] and IV fluids increased to 100 cc hr per Maren ReamerKaren Kirby         Plan of Care (if not transferred): Hand off report to [facility], RN to be diligent in monitoring pulse ox and heart rate.  Weaned O2 as tolerated to maintain O2 sats >93%  Event Summary: Name of Physician Notified: Maren ReamerKaren Kirby, NP at 0200    at    Outcome: Stayed in room and stabalized       Railee Bonillas, [place]

## 2015-12-18 NOTE — Evaluation (Signed)
Occupational Therapy Evaluation Patient Details Name: [name] MRN: [id number] DOB: 03-Oct-1929 Today's Date: 12/18/2015    History of Present Illness 82 yo admitted after fall with Right femur fx s/p IM nail . PMHx: CABG, HTN, COPD, aFib, CHF, DM, CAD   Clinical Impression   Unsure of pt's PLOF or home information as pt demonstrated some cognitive deficits including short-term memory. Pt unaware of fall, surgery, or date and unable to retain information despite multiple reminders. Pt required +2 assist for bed mobility and to attempt standing, but was unable to perform stand-pivot transfer to chair due to cardiopulmonary status. See PT note from same date for details on SpO2, HR, and BP during session. Recommend SNF for post-acute rehab stay to maximize independence and safety with ADLs and mobility. Will continue to follow acutely to address OT needs and goals.    Follow Up Recommendations  SNF;Supervision/Assistance - 24 hour    Equipment Recommendations  Other (comment) (TBD at next venue of care)    Recommendations for Other Services       Precautions / Restrictions Precautions Precautions: Fall Precaution Comments: watch HR and O2 Restrictions Weight Bearing Restrictions: Yes RLE Weight Bearing: Weight bearing as tolerated      Mobility Bed Mobility Overal bed mobility: Needs Assistance;+2 for physical assistance Bed Mobility: Supine to Sit;Sit to Supine     Supine to sit: Min assist;HOB elevated Sit to supine: Mod assist;+2 for physical assistance   General bed mobility comments: min assist for RLE to pivot to EOB with cues for sequence, use of rail and HOB elevated to pivot to EOB. With return to bed 2 person mod assist to pivot pt with max +2 to scoot to [facility]  Transfers Overall transfer level: Needs assistance Equipment used: Rolling walker (2 wheeled);2 person hand held assist Transfers: Sit to/from [employer]Stand;Stand Pivot Transfers Sit to Stand: Max assist;+2  physical assistance;From elevated surface Stand pivot transfers: Max assist;+2 physical assistance       General transfer comment: bil knees blocked with cues for hand placement and anterior translation with use of belt and pad for pt to stand. Attempted x 2 trials with and without RW, without pt able to have more assist and achieve fully upright for grossly 1 min. Did not perform transfer to chair at this time due to pt cardiopulmonary status    Balance Overall balance assessment: Needs assistance Sitting-balance support: Bilateral upper extremity supported;Feet supported Sitting balance-Leahy Scale: Fair     Standing balance support: Bilateral upper extremity supported;During functional activity Standing balance-Leahy Scale: Zero Standing balance comment: requires +2 assist to achieve upright position                            ADL Overall ADL's : Needs assistance/impaired Eating/Feeding: Set up;Sitting   Grooming: Wash/dry hands;Wash/dry face;Set up;Bed level   Upper Body Bathing: Minimal assitance;Bed level   Lower Body Bathing: Maximal assistance;+2 for physical assistance;Bed level   Upper Body Dressing : Minimal assistance;Bed level   Lower Body Dressing: Maximal assistance;+2 for physical assistance;Bed level   Toilet Transfer: Maximal assistance;+2 for physical assistance;Stand-pivot;BSC   Toileting- Clothing Manipulation and Hygiene: Maximal assistance;+2 for physical assistance;Sit to/from stand       Functional mobility during ADLs: Maximal assistance;+2 for physical assistance       Vision Vision Assessment?: No apparent visual deficits   Perception     Praxis      Pertinent Vitals/Pain Pain Assessment:  Faces Faces Pain Scale: Hurts even more Pain Location: RLE with movement Pain Descriptors / Indicators: Grimacing;Moaning Pain Intervention(s): Limited activity within patient's tolerance;Monitored during session;Repositioned     Hand  Dominance Right   Extremity/Trunk Assessment Upper Extremity Assessment Upper Extremity Assessment: Generalized weakness   Lower Extremity Assessment Lower Extremity Assessment: RLE deficits/detail RLE Deficits / Details: pt required assist to move RLE throughout session post op RLE: Unable to fully assess due to pain   Cervical / Trunk Assessment Cervical / Trunk Assessment: Kyphotic   Communication Communication Communication: No difficulties   Cognition Arousal/Alertness: Awake/alert Behavior During Therapy: WFL for tasks assessed/performed Overall Cognitive Status: Impaired/Different from baseline Area of Impairment: Orientation;Memory;Following commands;Problem solving;Awareness;Safety/judgement Orientation Level: Disoriented to;Place;Time;Situation   Memory: Decreased short-term memory Following Commands: Follows one step commands with increased time Safety/Judgement: Decreased awareness of safety;Decreased awareness of deficits Awareness: Intellectual Problem Solving: Slow processing;Decreased initiation;Requires verbal cues General Comments: Pt would continually state "My hip doesn't feel right and it hurts" and required multiple reminders that she had fallen at home and broken her hip. Pt responded "2012 or 2013?" to the year and was stated "4" to what month it currently is.    General Comments       Exercises       Shoulder Instructions      Home Living Family/patient expects to be discharged to:: Private residence Living Arrangements: Alone Available Help at Discharge: Family;Available PRN/intermittently Type of Home: House Home Access: Ramped entrance     Home Layout: One level     Bathroom Shower/Tub: Chief Strategy Officer: Standard     Home Equipment: Cane - single point;Walker - 2 wheels;Bedside commode;Shower seat   Additional Comments: family assists with meals and cleaning      Prior Functioning/Environment Level of  Independence: Needs assistance  Gait / Transfers Assistance Needed: Uses RW ADL's / Homemaking Assistance Needed: Per pt report, family assists with meals and cleaning. No family present to confirm   Comments: On home O2    OT Diagnosis: Generalized weakness;Cognitive deficits;Acute pain   OT Problem List: Decreased strength;Decreased range of motion;Decreased activity tolerance;Impaired balance (sitting and/or standing);Decreased knowledge of use of DME or AE;Decreased knowledge of precautions;Decreased cognition;Decreased safety awareness;Pain   OT Treatment/Interventions: Self-care/ADL training;Therapeutic exercise;Energy conservation;DME and/or AE instruction;Therapeutic activities;Patient/family education;Balance training    OT Goals(Current goals can be found in the care plan section) Acute Rehab OT Goals Patient Stated Goal: be able to walk and return home OT Goal Formulation: With patient Time For Goal Achievement: 01/01/16 Potential to Achieve Goals: Good ADL Goals Pt Will Perform Grooming: with min assist;standing Pt Will Perform Upper Body Bathing: with set-up;sitting Pt Will Perform Lower Body Bathing: with min assist;sit to/from stand Pt Will Transfer to Toilet: with min assist;stand pivot transfer;ambulating;bedside commode Pt Will Perform Toileting - Clothing Manipulation and hygiene: with min assist;sit to/from stand  OT Frequency: Min 2X/week   Barriers to D/C: Decreased caregiver support  Unsure if pt has 24/7 assistance available at home       Co-evaluation PT/OT/SLP Co-Evaluation/Treatment: Yes Reason for Co-Treatment: Complexity of the patient's impairments (multi-system involvement);For patient/therapist safety PT goals addressed during session: Mobility/safety with mobility;Balance;Proper use of DME OT goals addressed during session: ADL's and self-care;Strengthening/ROM      End of Session Equipment Utilized During Treatment: Gait belt;Rolling  walker;Oxygen Nurse Communication: Mobility status;Other (comment) (O2 sats while mobilizing)  Activity Tolerance: Patient limited by pain Patient left: in bed;with call bell/phone within reach;with bed alarm  set;with SCD's reapplied   Time: [id number] OT Time Calculation (min): 30 min Charges:  OT General Charges $OT Visit: 1 Procedure OT Evaluation $OT Eval Moderate Complexity: 1 Procedure G-Codes:    Nils PyleJulia Larri Yehle, OTR/L Pager: [phone]: [phone] 12/18/2015, 1:38 PM

## 2015-12-19 ENCOUNTER — Inpatient Hospital Stay (HOSPITAL_COMMUNITY): Payer: Medicare Other

## 2015-12-19 DIAGNOSIS — I482 Chronic atrial fibrillation: Secondary | ICD-10-CM

## 2015-12-19 LAB — BASIC METABOLIC PANEL
Anion gap: 3 — ABNORMAL LOW (ref 5–15)
BUN: 25 mg/dL — AB (ref 6–20)
CALCIUM: 8.9 mg/dL (ref 8.9–10.3)
CO2: 29 mmol/L (ref 22–32)
CREATININE: 0.96 mg/dL (ref 0.44–1.00)
Chloride: 103 mmol/L (ref 101–111)
GFR calc Af Amer: >60 mL/min (ref >60)
GFR, EST NON AFRICAN AMERICAN: 53 mL/min — AB (ref >60)
GLUCOSE: 119 mg/dL — AB (ref 65–99)
Potassium: 4.3 mmol/L (ref 3.5–5.1)
SODIUM: 135 mmol/L (ref 135–145)

## 2015-12-19 LAB — GLUCOSE, CAPILLARY
GLUCOSE-CAPILLARY: 120 mg/dL — AB (ref 65–99)
GLUCOSE-CAPILLARY: 221 mg/dL — AB (ref 65–99)
GLUCOSE-CAPILLARY: 177 mg/dL — AB (ref 65–99)
Glucose-Capillary: 157 mg/dL — ABNORMAL HIGH (ref 65–99)

## 2015-12-19 LAB — CBC
HCT: 28 % — ABNORMAL LOW (ref 36.0–46.0)
Hemoglobin: 8.6 g/dL — ABNORMAL LOW (ref 12.0–15.0)
MCH: 26.7 pg (ref 26.0–34.0)
MCHC: 30.7 g/dL (ref 30.0–36.0)
MCV: 87 fL (ref 78.0–100.0)
PLATELETS: 134 10*3/uL — AB (ref 150–400)
RBC: 3.22 MIL/uL — AB (ref 3.87–5.11)
RDW: 14.8 % (ref 11.5–15.5)
WBC: 11.7 10*3/uL — AB (ref 4.0–10.5)

## 2015-12-19 LAB — ECHOCARDIOGRAM COMPLETE
Height: 72 in
Weight: 2283.2 oz

## 2015-12-19 MED ORDER — HYDROCORTISONE NA SUCCINATE PF 100 MG IJ SOLR
50.0000 mg | Freq: Two times a day (BID) | INTRAMUSCULAR | Status: DC
  Administered 2015-12-19: 50 mg via INTRAVENOUS
  Filled 2015-12-19 (×2): qty 2

## 2015-12-19 NOTE — Progress Notes (Signed)
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 She has a right intratrochanteric fracture. Orthopedic surgery, Dr. Osie Bond has seen her and requested a consultation given her vast cardiovascular history \    Assessment & Plan:   Principal Problem:   Closed intertrochanteric fracture of hip (HCC) Active Problems:   Postoperative anemia due to acute blood loss   Hypertension   Aneurysm, aortic (HCC)   Syncope   Carotid artery disease (HCC)   Atrial fibrillation (HCC)   Insulin dependent type 2 diabetes mellitus (HCC)   Pressure ulcer   Aneurysm of abdominal aorta (HCC)  #1 intertrochanteric fracture of the right hip Secondary to mechanical fall. Status post IM nail to right femoral per Dr. Dion Saucier orthopedics. PT/OT. Placed on scheduled Tylenol. Patient likely needs skilled nursing facility placement once medically stable. Continue pain management. Per orthopedics.  #2 postop anemia due to acute blood loss Hemoglobin currently at 8.6 from 6.6 on 12/18/2015. Patient with no overt bleeding. Patient status post 2 units packed red blood cells. Anemia panel pending. Transfusion threshold hemoglobin less than 7. Follow H&H.  #3 hypertension Patient's blood pressure has been running low and a such diuretics were held. Currently on Cardizem and digoxin. Follow.  #4 insulin-dependent type 2 diabetes Hemoglobin A1c was 5.0. CBGs have ranged from 120 -221. Continue current regimen of Lantus, sliding  scale insulin.  #5 aortic root aneurysm CT angiogram chest negative for intrathoracic aortic aneurysm. Follow.  #6 carotid artery disease status post endarterectomy,  Doppler, 2011 stable, 0-39% R ICA, 40-59% LICA cardiac enzymes negative.  #7 history of atrial fibrillation Continue digoxin and Cardizem for rate control. Patient not an anticoagulation candidate due to increased risk of falls.  # 8 COPD exacerbation  Some clinical improvement. Patient with no wheezing noted on anterior lung examination. Patient with no increased work of breathing. COPD exacerbation improving. Taper IV steroids, continue PPI,Dulera, Spiriva. Nebs as needed.  #9 diastolic CHF Compensated. Lasix on hold due to low blood pressure. Follow closely.        DVT prophylaxis: Lovenox Code Status: Full Family Communication: Updated patient. No family at bedside. Disposition Plan: Likely skilled nursing facility when medically stable.   Consultants:   Cardiology: Dr. Anne Fu 12/16/2015  Orthopedics: Dr. Dion Saucier 12/17/2015  Procedures:   CT head /CT C-spine 12/16/2015  Chest x-ray 12/18/2015, 12/16/2015  CT angiogram chest/abdomen/pelvis 12/16/2015  Plain films right femur 12/17/2015  Plain films right hip and pelvis 12/16/2015  Intramedullary nail right femoral per Dr. Dion Saucier 12/17/2015  2 units packed red blood cells 12/18/2015  2-D echo pending 12/19/2015  Antimicrobials:   None   Subjective: Patient denies any chest pain. No shortness of breath. Patient states as long as she doesn't move is not in significant pain.  Objective: Vitals:   12/18/15 2135 12/19/15 0221 12/19/15 0612 12/19/15 1415  BP:   (!) 126/59 (!) 105/50  Pulse:   96 87  Resp:   18 18  Temp:   97.5 F (36.4 C) 98.3 F (36.8 C)  TempSrc:   Oral Oral  SpO2: 95% 94%  96% 90%  Weight:      Height:        Intake/Output Summary (Last 24 hours) at 12/19/15 1820 Last data filed at 12/19/15 0737  Gross per 24  hour  Intake              360 ml  Output              500 ml  Net             -140 ml   Filed Weights   12/16/15 1804 12/16/15 2114  Weight: 77.1 kg (170 lb) 64.7 kg (142 lb 11.2 oz)    Examination:  General exam: Appears calm and comfortable  Respiratory system: Clear to auscultation anterior lung fields. Respiratory effort normal. Cardiovascular system: S1 & S2 heard, RRR. No JVD, murmurs, rubs, gallops or clicks. No pedal edema. Gastrointestinal system: Abdomen is nondistended, soft and nontender. No organomegaly or masses felt. Normal bowel sounds heard. Central nervous system: Alert and oriented. No focal neurological deficits. Extremities: Symmetric 5 x 5 power. Skin: No rashes, lesions or ulcers Psychiatry: Judgement and insight appear normal. Mood & affect appropriate.     Data Reviewed: I have personally reviewed following labs and imaging studies  CBC:  Recent Labs Lab 12/16/15 1548 12/16/15 1835 12/17/15 0537 12/18/15 0303 12/18/15 1355 12/19/15 0422  WBC 11.6* 14.3* 11.8* 10.1  --  11.7*  NEUTROABS 10.8*  --   --   --   --   --   HGB 10.5* 10.7* 9.8* 6.6* 9.0* 8.6*  HCT 34.5* 34.9* 31.8* 21.5* 29.3* 28.0*  MCV 88.5 87.9 87.6 87.8  --  87.0  PLT 185 202 205 148*  --  134*   Basic Metabolic Panel:  Recent Labs Lab 12/16/15 1548 12/16/15 1835 12/17/15 0537 12/18/15 0303 12/19/15 0422  NA 137  --  136 137 135  K 4.1  --  4.4 4.5 4.3  CL 105  --  101 104 103  CO2 25  --  29 29 29   GLUCOSE 189*  --  144* 167* 119*  BUN 23*  --  23* 27* 25*  CREATININE 0.85 0.86 0.88 1.04* 0.96  CALCIUM 9.9  --  9.9 8.5* 8.9   GFR: Estimated Creatinine Clearance: 44.6 mL/min (by C-G formula based on SCr of 0.96 mg/dL). Liver Function Tests:  Recent Labs Lab 12/16/15 1835 12/18/15 0303  AST 15 11*  ALT 14 11*  ALKPHOS 48 37*  BILITOT 0.7 0.3  PROT 5.7* 4.2*  ALBUMIN 3.4* 2.7*   No results for input(s): LIPASE, AMYLASE in the last 168 hours. No results  for input(s): AMMONIA in the last 168 hours. Coagulation Profile:  Recent Labs Lab 12/17/15 0537  INR 1.02   Cardiac Enzymes:  Recent Labs Lab 12/16/15 1835 12/17/15 0009 12/17/15 0537  TROPONINI <0.03 <0.03 <0.03   BNP (last 3 results) No results for input(s): PROBNP in the last 8760 hours. HbA1C:  Recent Labs  12/16/15 1854  HGBA1C 5.0   CBG:  Recent Labs Lab 12/18/15 1624 12/18/15 2037 12/19/15 0603 12/19/15 1132 12/19/15 1558  GLUCAP 156* 112* 120* 221* 177*   Lipid Profile: No results for input(s): CHOL, HDL, LDLCALC, TRIG, CHOLHDL, LDLDIRECT in the last 72 hours. Thyroid Function Tests:  Recent Labs  12/17/15 0009  TSH 1.320   Anemia Panel: No results for input(s): VITAMINB12, FOLATE, FERRITIN, TIBC, IRON, RETICCTPCT in the last 72 hours. Sepsis Labs: No results for input(s): PROCALCITON, LATICACIDVEN in the  last 168 hours.  Recent Results (from the past 240 hour(s))  Surgical pcr screen     Status: None   Collection Time: 12/16/15 10:47 PM  Result Value Ref Range Status   MRSA, PCR NEGATIVE NEGATIVE Final   Staphylococcus aureus NEGATIVE NEGATIVE Final    Comment:        The Xpert SA Assay (FDA approved for NASAL specimens in patients over 221 years of age), is one component of a comprehensive surveillance program.  Test performance has been validated by [facility] for patients greater than or equal to 80 year old. It is not intended to diagnose infection nor to guide or monitor treatment.          Radiology Studies: Dg Chest Port 1 View  Result Date: 12/18/2015 CLINICAL DATA:  Shortness of breath tonight. EXAM: PORTABLE CHEST 1 VIEW COMPARISON:  Chest radiographs 12/16/2015. FINDINGS: Patient is post median sternotomy. Cardiomegaly with tortuous atherosclerotic thoracic aorta, unchanged. Vascular congestion and cephalization of pulmonary vasculature, again seen. Bilateral pleural effusions, possible increase on the right.  Associated hazy opacity at the lung bases likely atelectasis. Right upper lobe calcified granulomas versus scarring. No new move IMPRESSION: Bilateral pleural effusions, with increased pleural fluid on the right. Mild cardiomegaly and vascular congestion, stable. Electronically Signed   By: Rubye OaksMelanie  Ehinger M.D.   On: 12/18/2015 02:25        Scheduled Meds: . acetaminophen  500 mg Oral TID  . atorvastatin  20 mg Oral q1800  . digoxin  0.0625 mg Oral Daily  . diltiazem  120 mg Oral Daily  . docusate sodium  100 mg Oral BID  . enoxaparin (LOVENOX) injection  40 mg Subcutaneous Q24H  . feeding supplement (ENSURE ENLIVE)  237 mL Oral Q24H  . feeding supplement (PRO-STAT SUGAR FREE 64)  30 mL Oral Daily  . ferrous sulfate  325 mg Oral TID PC  . hydrocortisone sod succinate (SOLU-CORTEF) inj  50 mg Intravenous Q12H  . insulin aspart  0-9 Units Subcutaneous TID WC  . insulin glargine  10 Units Subcutaneous QHS  . magnesium oxide  400 mg Oral Daily  . mometasone-formoterol  2 puff Inhalation BID  . pantoprazole  40 mg Oral Daily  . potassium chloride SA  20 mEq Oral Daily  . senna  1 tablet Oral BID  . tiotropium  1 capsule Inhalation Daily   Continuous Infusions:    LOS: 3 days    Time spent: 35 minutes    Lashanna Angelo, MD [facility] Pager [phone]  If 7PM-7AM, please contact night-coverage [email or web address] Password [email or web address] 12/19/2015, 6:20 PM

## 2015-12-19 NOTE — Clinical Social Work Placement (Signed)
   CLINICAL SOCIAL WORK PLACEMENT  NOTE  Date:  12/19/2015  Patient Details  Name: Randa Lynntta S Hey MRN: [id number] Date of Birth: 1929/10/23  Clinical Social Work is seeking post-discharge placement for this patient at the Skilled  Nursing Facility level of care (*CSW will initial, date and re-position this form in  chart as items are completed):  Yes   Patient/family provided with [facility] Clinical Social Work Department's list of facilities offering this level of care within the geographic area requested by the patient (or if unable, by the patient's family).  Yes   Patient/family informed of their freedom to choose among providers that offer the needed level of care, that participate in Medicare, Medicaid or managed care program needed by the patient, have an available bed and are willing to accept the patient.  Yes   Patient/family informed of [facility] ownership interest in [facility] and [facility], as well as of the fact that they are under no obligation to receive care at these facilities.  PASRR submitted to EDS on       PASRR number received on       Existing PASRR number confirmed on 12/18/15     FL2 transmitted to all facilities in geographic area requested by pt/family on 12/18/15     FL2 transmitted to all facilities within larger geographic area on       Patient informed that his/her managed care company has contracts with or will negotiate with certain facilities, including the following:        Yes   Patient/family informed of bed offers received.  Patient chooses bed at [facility] [place]     Physician recommends and patient chooses bed at      Patient to be transferred to   on  .  Patient to be transferred to facility by       Patient family notified on   of transfer.  Name of family member notified:        PHYSICIAN Please prepare priority discharge summary, including medications, Please sign FL2, Please prepare prescriptions      Additional Comment:    _______________________________________________ Reggy EyeLaShonda A Mickle Campton, LCSW 12/19/2015, 10:46 AM

## 2015-12-19 NOTE — Progress Notes (Signed)
Foley cath DC as ordered

## 2015-12-19 NOTE — Clinical Social Work Note (Signed)
CSW called patient's daughter and presented bed offers. Patient's daughter has preference for [facility] [place]. CS[facility] [place]. She reported she has bed available for the patient when medically stable.   Valero EnergyShonny Hanz [place]ter, LCSW [phone]

## 2015-12-19 NOTE — Care Management Note (Signed)
Case Management Note Donn PieriniKristi Momen Ham RN, BSN Unit 2W-Case Manager [phone]  Patient Details  Name: Randa Lynntta S Gaspari MRN: [id number] Date of Birth: 05-May-1929  Subjective/Objective:     Admitted s/p fall at home with hip fx               Action/Plan: PTA pt lived at home with family- anticipate d/c to SNF- PT/OT evals- CSW following for placement needs  Expected Discharge Date:                  Expected Discharge Plan:  Skilled Nursing Facility  In-House Referral:  Clinical Social Work  Discharge planning Services  CM Consult  Post Acute Care Choice:    Choice offered to:     DME Arranged:    DME Agency:     HH Arranged:    HH Agency:     Status of Service:  In process, will continue to follow  If discussed at Long Length of Stay Meetings, dates discussed:    Additional Comments:  Darrold SpanWebster, Ciarrah Rae Hall, RN 12/19/2015, 10:07 AM

## 2015-12-19 NOTE — Care Management Important Message (Signed)
Important Message  Patient Details  Name: [name] MRN: [id number] Date of Birth: 11-15-1929   Medicare Important Message Given:  Yes    Kyla BalzarineShealy, Jane Birkel Abena 12/19/2015, 11:29 AM

## 2015-12-19 NOTE — Clinical Social Work Note (Signed)
Clinical Social Work Assessment  Patient Details  Name: [name] MRN: [id number] Date of Birth: 1929-08-28  Date of referral:  12/19/15               Reason for consult:  Discharge Planning                Permission sought to share information with:  Magazine features editoracility Contact Representative, Multimedia programmersychiatrist Permission granted to share information::  No  Name::     Armed forces operational officereynolds,Karen  Agency::  SNFs  Relationship::  Daughter  Contact Information:     Housing/Transportation Living arrangements for the past 2 months:  Single Family Home Source of Information:  Adult Children Patient Interpreter Needed:  None Criminal Activity/Legal Involvement Pertinent to Current Situation/Hospitalization:  No - Comment as needed Significant Relationships:  Adult Children Lives with:  Adult Children Do you feel safe going back to the place where you live?  Yes Need for family participation in patient care:  Yes (Comment)  Care giving concerns:  CSW spoke with the patient's daughter [name]. She reported she would like the patient to have short term rehab before returning home.   Social Worker assessment / plan:[name]. CSW called patient's daughter. CSW explained PT recommendation for SNF placement. CSW explained SNF search and placement process to the patient's daughter and answered her questions. Patient's daughter reported preference with [facility].  CSW will follow up with bed offers. Employment status:  Retired Database administratornsurance information:  Managed Medicare PT Recommendations:  Skilled Nursing Facility Information / Referral to community resources:  Skilled Nursing Facility  Patient/Family's Response to care: The patient's daughter is happy with the care the patient has received.   Patient/Family's Understanding of and Emotional Response to Diagnosis, Current Treatment, and Prognosis:  The patient's daughter has a good understanding of why the patient was admitted. She understands the care plan and what the patient will  need post discharge.  Emotional Assessment Appearance:  Appears stated age Attitude/Demeanor/Rapport:  Unable to Assess Affect (typically observed):  Unable to Assess Orientation:  Oriented to Self, Oriented to Place, Oriented to Situation Alcohol / Substance use:  Not Applicable Psych involvement (Current and /or in the community):  No (Comment)  Discharge Needs  Concerns to be addressed:  Discharge Planning Concerns Readmission within the last 30 days:  No Current discharge risk:  None Barriers to Discharge:  Continued Medical Work up   Electronic Data SystemsLaShonda A Creston Klas, LCSW 12/19/2015, 10:40 AM

## 2015-12-19 NOTE — Progress Notes (Signed)
Patient ID: Randa Lynntta S Vohra, female   DOB: 1930/01/31, 82 y.o.   MRN: [id number]     Subjective:  Patient reports pain as mild.  Patient in bed resting well.  Denies any CP or SOB  Objective:   VITALS:   Vitals:   12/18/15 1930 12/18/15 2135 12/19/15 0221 12/19/15 0612  BP: 129/71   (!) 126/59  Pulse: 84   96  Resp: 16   18  Temp: 98 F (36.7 C)   97.5 F (36.4 C)  TempSrc: Oral   Oral  SpO2: 97% 95% 94% 96%  Weight:      Height:        ABD soft Sensation intact distally Dorsiflexion/Plantar flexion intact Incision: dressing C/D/I Dressing peeled back and wounds are good no sign of infection   Lab Results  Component Value Date   WBC 11.7 (H) 12/19/2015   HGB 8.6 (L) 12/19/2015   HCT 28.0 (L) 12/19/2015   MCV 87.0 12/19/2015   PLT 134 (L) 12/19/2015   BMET    Component Value Date/Time   NA 135 12/19/2015 0422   K 4.3 12/19/2015 0422   CL 103 12/19/2015 0422   CO2 29 12/19/2015 0422   GLUCOSE 119 (H) 12/19/2015 0422   BUN 25 (H) 12/19/2015 0422   CREATININE 0.96 12/19/2015 0422   CREATININE 0.99 (H) 04/17/2015 0937   CALCIUM 8.9 12/19/2015 0422   GFRNONAA 53 (L) 12/19/2015 0422   GFRAA >60 12/19/2015 0422     Assessment/Plan: 2 Days Post-Op   Principal Problem:   Closed intertrochanteric fracture of hip (HCC) Active Problems:   Hypertension   Aneurysm, aortic (HCC)   Syncope   Carotid artery disease (HCC)   Atrial fibrillation (HCC)   Insulin dependent type 2 diabetes mellitus (HCC)   Pressure ulcer   Postoperative anemia due to acute blood loss   Aneurysm of abdominal aorta (HCC)   Advance diet Up with therapy  ABLA continue to monitor improved after 2 units yesterday Continue plan per medicine WBAT Dry dressing PRN Okay for dressing change    DOUGLAS PARRY, BRANDON 12/19/2015, 7:11 AM  Discussed and agree with above.   Teryl LucyJoshua Cobie Marcoux, MD Cell [phone]

## 2015-12-19 NOTE — Progress Notes (Signed)
  Echocardiogram 2D Echocardiogram has been performed.  Arvil ChacoFoster, Talli Kimmer 12/19/2015, 12:32 PM

## 2015-12-20 DIAGNOSIS — R0602 Shortness of breath: Secondary | ICD-10-CM

## 2015-12-20 LAB — GLUCOSE, CAPILLARY
GLUCOSE-CAPILLARY: 112 mg/dL — AB (ref 65–99)
GLUCOSE-CAPILLARY: 102 mg/dL — AB (ref 65–99)
GLUCOSE-CAPILLARY: 127 mg/dL — AB (ref 65–99)
GLUCOSE-CAPILLARY: 191 mg/dL — AB (ref 65–99)

## 2015-12-20 LAB — CBC
HEMATOCRIT: 28 % — AB (ref 36.0–46.0)
HEMOGLOBIN: 8.8 g/dL — AB (ref 12.0–15.0)
MCH: 27.2 pg (ref 26.0–34.0)
MCHC: 31.4 g/dL (ref 30.0–36.0)
MCV: 86.4 fL (ref 78.0–100.0)
Platelets: 161 10*3/uL (ref 150–400)
RBC: 3.24 MIL/uL — ABNORMAL LOW (ref 3.87–5.11)
RDW: 14.8 % (ref 11.5–15.5)
WBC: 13.3 10*3/uL — AB (ref 4.0–10.5)

## 2015-12-20 LAB — BASIC METABOLIC PANEL
ANION GAP: 4 — AB (ref 5–15)
BUN: 24 mg/dL — ABNORMAL HIGH (ref 6–20)
CALCIUM: 9 mg/dL (ref 8.9–10.3)
CHLORIDE: 100 mmol/L — AB (ref 101–111)
CO2: 28 mmol/L (ref 22–32)
CREATININE: 0.87 mg/dL (ref 0.44–1.00)
GFR calc non Af Amer: 59 mL/min — ABNORMAL LOW (ref >60)
Glucose, Bld: 143 mg/dL — ABNORMAL HIGH (ref 65–99)
Potassium: 4.6 mmol/L (ref 3.5–5.1)
SODIUM: 132 mmol/L — AB (ref 135–145)

## 2015-12-20 LAB — TYPE AND SCREEN
ABO/RH(D): AB POS
Antibody Screen: NEGATIVE
UNIT DIVISION: 0
Unit division: 0
Unit division: 0
Unit division: 0
Unit division: 0

## 2015-12-20 MED ORDER — PREDNISONE 20 MG PO TABS
60.0000 mg | ORAL_TABLET | Freq: Every day | ORAL | Status: DC
  Administered 2015-12-20 – 2015-12-21 (×2): 60 mg via ORAL
  Filled 2015-12-20 (×2): qty 3

## 2015-12-20 NOTE — Progress Notes (Addendum)
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 She has a right intratrochanteric fracture. Orthopedic surgery, Dr. Osie Bond has seen her and requested a consultation given her vast cardiovascular history \    Assessment & Plan:   Principal Problem:   Closed intertrochanteric fracture of hip (HCC) Active Problems:   Postoperative anemia due to acute blood loss   Hypertension   Aneurysm, aortic (HCC)   Syncope   Carotid artery disease (HCC)   Atrial fibrillation (HCC)   Insulin dependent type 2 diabetes mellitus (HCC)   Pressure ulcer   Aneurysm of abdominal aorta (HCC)  #1 intertrochanteric fracture of the right hip Secondary to mechanical fall. Status post IM nail to right femoral per Dr. Dion Saucier orthopedics. PT/OT. Placed on scheduled Tylenol. Patient likely needs skilled nursing facility placement once medically stable. Continue pain management. Per orthopedics.  #2 postop anemia due to acute blood loss Hemoglobin currently at 8.8 from 8.6 from 6.6 on 12/18/2015. Patient with no overt bleeding. Patient status post 2 units packed red blood cells. Anemia panel pending. Transfusion threshold hemoglobin less than 7. Follow H&H.  #3 hypertension Patient's blood pressure has been running low and as such diuretics were held. Blood pressure has improved. Currently on Cardizem and digoxin. Follow.  #4 insulin-dependent type 2 diabetes Hemoglobin A1c was 5.0. CBGs have ranged from 102 - 157.  Discontinue Lantus. Continue sliding scale insulin.  #5 aortic root aneurysm CT angiogram chest negative for intrathoracic aortic aneurysm. Follow.  #6 carotid artery disease status post endarterectomy,  Doppler, 2011 stable, 0-39% R ICA, 40-59% LICA cardiac enzymes negative.  #7 history of atrial fibrillation Continue digoxin and Cardizem for rate control. Patient not an anticoagulation candidate due to increased risk of falls.  # 8 COPD exacerbation  Some clinical improvement. Patient with no wheezing noted on anterior lung examination. Patient with no increased work of breathing. COPD exacerbation improving. Transition to oral prednisone taper; continue PPI,Dulera, Spiriva. Nebs as needed.  #9 diastolic CHF Compensated. Lasix on hold due to low blood pressure. Blood pressure improving likely resume Lasix on discharge. Follow closely.        DVT prophylaxis: Lovenox Code Status: Full Family Communication: Updated patient. No family at bedside. Disposition Plan: Likely skilled nursing facility when medically stable.   Consultants:   Cardiology: Dr. Anne Fu 12/16/2015  Orthopedics: Dr. Dion Saucier 12/17/2015  Procedures:   CT head /CT C-spine 12/16/2015  Chest x-ray 12/18/2015, 12/16/2015  CT angiogram chest/abdomen/pelvis 12/16/2015  Plain films right femur 12/17/2015  Plain films right hip and pelvis 12/16/2015  Intramedullary nail right femoral per Dr. Dion Saucier 12/17/2015  2 units packed red blood cells 12/18/2015  2-D echo 12/19/2015  Antimicrobials:   None   Subjective: Patient denies any chest pain. No shortness of breath.   Objective: Vitals:   12/20/15 0934 12/20/15 1005 12/20/15 1007 12/20/15 1420  BP: (!) 158/87   (!) 145/73  Pulse: 97   71  Resp:    17  Temp:    98.2 F (36.8 C)  TempSrc:    Oral  SpO2:  100% 100%  99%  Weight:      Height:        Intake/Output Summary (Last 24 hours) at 12/20/15 1939 Last data filed at 12/20/15 1700   Gross per 24 hour  Intake              240 ml  Output              600 ml  Net             -360 ml   Filed Weights   12/16/15 1804 12/16/15 2114  Weight: 77.1 kg (170 lb) 64.7 kg (142 lb 11.2 oz)    Examination:  General exam: Appears calm and comfortable  Respiratory system: Clear to auscultation anterior lung fields. Respiratory effort normal. Cardiovascular system: S1 & S2 heard, RRR. No JVD, murmurs, rubs, gallops or clicks. No pedal edema. Gastrointestinal system: Abdomen is nondistended, soft and nontender. No organomegaly or masses felt. Normal bowel sounds heard. Central nervous system: Alert and oriented. No focal neurological deficits. Extremities: Symmetric 5 x 5 power. Skin: No rashes, lesions or ulcers Psychiatry: Judgement and insight appear normal. Mood & affect appropriate.     Data Reviewed: I have personally reviewed following labs and imaging studies  CBC:  Recent Labs Lab 12/16/15 1548 12/16/15 1835 12/17/15 0537 12/18/15 0303 12/18/15 1355 12/19/15 0422 12/20/15 0252  WBC 11.6* 14.3* 11.8* 10.1  --  11.7* 13.3*  NEUTROABS 10.8*  --   --   --   --   --   --   HGB 10.5* 10.7* 9.8* 6.6* 9.0* 8.6* 8.8*  HCT 34.5* 34.9* 31.8* 21.5* 29.3* 28.0* 28.0*  MCV 88.5 87.9 87.6 87.8  --  87.0 86.4  PLT 185 202 205 148*  --  134* 161   Basic Metabolic Panel:  Recent Labs Lab 12/16/15 1548 12/16/15 1835 12/17/15 0537 12/18/15 0303 12/19/15 0422 12/20/15 0252  NA 137  --  136 137 135 132*  K 4.1  --  4.4 4.5 4.3 4.6  CL 105  --  101 104 103 100*  CO2 25  --  29 29 29 28   GLUCOSE 189*  --  144* 167* 119* 143*  BUN 23*  --  23* 27* 25* 24*  CREATININE 0.85 0.86 0.88 1.04* 0.96 0.87  CALCIUM 9.9  --  9.9 8.5* 8.9 9.0   GFR: Estimated Creatinine Clearance: 49.2 mL/min (by C-G formula based on SCr of 0.87 mg/dL). Liver Function Tests:  Recent Labs Lab 12/16/15 1835 12/18/15 0303  AST 15 11*  ALT 14 11*  ALKPHOS 48 37*  BILITOT 0.7 0.3  PROT  5.7* 4.2*  ALBUMIN 3.4* 2.7*   No results for input(s): LIPASE, AMYLASE in the last 168 hours. No results for input(s): AMMONIA in the last 168 hours. Coagulation Profile:  Recent Labs Lab 12/17/15 0537  INR 1.02   Cardiac Enzymes:  Recent Labs Lab 12/16/15 1835 12/17/15 0009 12/17/15 0537  TROPONINI <0.03 <0.03 <0.03   BNP (last 3 results) No results for input(s): PROBNP in the last 8760 hours. HbA1C: No results for input(s): HGBA1C in the last 72 hours. CBG:  Recent Labs Lab 12/19/15 1558 12/19/15 2010 12/20/15 0731 12/20/15 1057 12/20/15 1702  GLUCAP 177* 157* 112* 102* 127*   Lipid Profile: No results for input(s): CHOL, HDL, LDLCALC, TRIG, CHOLHDL, LDLDIRECT in the last 72 hours. Thyroid Function Tests: No results for input(s): TSH, T4TOTAL, FREET4, T3FREE, THYROIDAB in the last 72 hours. Anemia Panel: No results for input(s):  VITAMINB12, FOLATE, FERRITIN, TIBC, IRON, RETICCTPCT in the last 72 hours. Sepsis Labs: No results for input(s): PROCALCITON, LATICACIDVEN in the last 168 hours.  Recent Results (from the past 240 hour(s))  Surgical pcr screen     Status: None   Collection Time: 12/16/15 10:47 PM  Result Value Ref Range Status   MRSA, PCR NEGATIVE NEGATIVE Final   Staphylococcus aureus NEGATIVE NEGATIVE Final    Comment:        The Xpert SA Assay (FDA approved for NASAL specimens in patients over 51 years of age), is one component of a comprehensive surveillance program.  Test performance has been validated by [facility] for patients greater than or equal to 54 year old. It is not intended to diagnose infection nor to guide or monitor treatment.          Radiology Studies: No results found.      Scheduled Meds: . acetaminophen  500 mg Oral TID  . atorvastatin  20 mg Oral q1800  . digoxin  0.0625 mg Oral Daily  . diltiazem  120 mg Oral Daily  . docusate sodium  100 mg Oral BID  . enoxaparin (LOVENOX) injection  40 mg  Subcutaneous Q24H  . feeding supplement (ENSURE ENLIVE)  237 mL Oral Q24H  . feeding supplement (PRO-STAT SUGAR FREE 64)  30 mL Oral Daily  . ferrous sulfate  325 mg Oral TID PC  . insulin aspart  0-9 Units Subcutaneous TID WC  . insulin glargine  10 Units Subcutaneous QHS  . magnesium oxide  400 mg Oral Daily  . mometasone-formoterol  2 puff Inhalation BID  . pantoprazole  40 mg Oral Daily  . potassium chloride SA  20 mEq Oral Daily  . predniSONE  60 mg Oral QAC breakfast  . senna  1 tablet Oral BID  . tiotropium  1 capsule Inhalation Daily   Continuous Infusions:    LOS: 4 days    Time spent: 35 minutes    Brigit Doke, MD [facility] Pager [phone]  If 7PM-7AM, please contact night-coverage [email or web address] Password [facility] 12/20/2015, 7:39 PM

## 2015-12-20 NOTE — Progress Notes (Signed)
Physical Therapy Treatment Patient Details Name: [name] MRN: [id number] DOB: April 17, 1930 Today's Date: 12/20/2015    History of Present Illness 82 yo admitted after fall with Right femur fx s/p IM nail . PMHx: CABG, HTN, COPD, aFib, CHF, DM, CAD    PT Comments    Pt needs encouragement for participation and repeatedly needs re-orienting to situation and task.  While sitting EOB pt indicates feeling dizzy.  BP found to be 187/81 and HR nonsustained from 140s to 170.  Pt returned to supine and RN made aware.  After being supine for ~543mins BP 160/84 and HR ranged 70's to 120s.  Will continue to follow.    Follow Up Recommendations  SNF     Equipment Recommendations  None recommended by PT    Recommendations for Other Services       Precautions / Restrictions Precautions Precautions: Fall Precaution Comments: watch HR and O2 Restrictions Weight Bearing Restrictions: Yes RLE Weight Bearing: Weight bearing as tolerated    Mobility  Bed Mobility Overal bed mobility: Needs Assistance;+2 for physical assistance Bed Mobility: Supine to Sit;Sit to Supine     Supine to sit: Mod assist;HOB elevated Sit to supine: Max assist;+2 for physical assistance   General bed mobility comments: pt does participate with coming to sitting.  Multimodal cues needed and A for bringing trunk up to sitting and scooting hips closer to EOB.    Transfers                    Ambulation/Gait                 Stairs            Wheelchair Mobility    Modified Rankin (Stroke Patients Only)       Balance Overall balance assessment: Needs assistance;History of Falls Sitting-balance support: Bilateral upper extremity supported;Feet supported Sitting balance-Leahy Scale: Poor                              Cognition Arousal/Alertness: Awake/alert Behavior During Therapy: WFL for tasks assessed/performed Overall Cognitive Status: No family/caregiver present to  determine baseline cognitive functioning                 General Comments: pt repeatedly asking "Did I brake my hip?".  pt needing re-orientation multiple times throughout session, but was aware she was in the hospital, just not which one.      Exercises      General Comments        Pertinent Vitals/Pain Pain Assessment: Faces Faces Pain Scale: Hurts even more Pain Location: R hip during mobility. Pain Descriptors / Indicators: Grimacing;Guarding;Moaning Pain Intervention(s): Monitored during session;Premedicated before session;Repositioned    Home Living                      Prior Function            PT Goals (current goals can now be found in the care plan section) Acute Rehab PT Goals Patient Stated Goal: be able to walk and return home PT Goal Formulation: With patient Time For Goal Achievement: 01/01/16 Potential to Achieve Goals: Fair Progress towards PT goals: Not progressing toward goals - comment (Increased HR and BP)    Frequency  Min 3X/week    PT Plan Current plan remains appropriate    Co-evaluation  End of Session Equipment Utilized During Treatment: Oxygen Activity Tolerance: Treatment limited secondary to medical complications (Comment) (Increased HR and BP) Patient left: in bed;with call bell/phone within reach;with bed alarm set     Time: 0930-1003 PT Time Calculation (min) (ACUTE ONLY): 33 min  Charges:  $Therapeutic Activity: 23-37 mins                    G CodesSunny Schlein, [place] [phone] 12/20/2015, 11:18 AM

## 2015-12-20 NOTE — Progress Notes (Signed)
Patient ID: [name], female   DOB: 1929-12-23, 82 y.o.   MRN: [id number]     Subjective:  Patient reports pain as mild.  Patient is alert and sitting up in bed.  She denies any CP or SOB  Objective:   VITALS:   Vitals:   12/19/15 0612 12/19/15 1415 12/19/15 2006 12/20/15 0525  BP: (!) 126/59 (!) 105/50 (!) 147/71 (!) 152/64  Pulse: 96 87 86 72  Resp: 18 18 18 18   Temp: 97.5 F (36.4 C) 98.3 F (36.8 C) 98.2 F (36.8 C) 97.7 F (36.5 C)  TempSrc: Oral Oral Oral Oral  SpO2: 96% 90% 98% 99%  Weight:      Height:        ABD soft Sensation intact distally Dorsiflexion/Plantar flexion intact Incision: dressing C/D/I and no drainage Clean dressing applied  Lab Results  Component Value Date   WBC 13.3 (H) 12/20/2015   HGB 8.8 (L) 12/20/2015   HCT 28.0 (L) 12/20/2015   MCV 86.4 12/20/2015   PLT 161 12/20/2015   BMET    Component Value Date/Time   NA 132 (L) 12/20/2015 0252   K 4.6 12/20/2015 0252   CL 100 (L) 12/20/2015 0252   CO2 28 12/20/2015 0252   GLUCOSE 143 (H) 12/20/2015 0252   BUN 24 (H) 12/20/2015 0252   CREATININE 0.87 12/20/2015 0252   CREATININE 0.99 (H) 04/17/2015 0937   CALCIUM 9.0 12/20/2015 0252   GFRNONAA 59 (L) 12/20/2015 0252   GFRAA >60 12/20/2015 0252     Assessment/Plan: 3 Days Post-Op   Principal Problem:   Closed intertrochanteric fracture of hip (HCC) Active Problems:   Hypertension   Aneurysm, aortic (HCC)   Syncope   Carotid artery disease (HCC)   Atrial fibrillation (HCC)   Insulin dependent type 2 diabetes mellitus (HCC)   Pressure ulcer   Postoperative anemia due to acute blood loss   Aneurysm of abdominal aorta (HCC)   Advance diet Up with therapy Continue plan per medicine wbat Dry dressing PRN   Haskel KhanDOUGLAS Enoch Moffa 12/20/2015, 8:06 AM   Teryl LucyJoshua Landau, MD Cell [phone]

## 2015-12-20 NOTE — Progress Notes (Signed)
Occupational Therapy Treatment Patient Details Name: [name] MRN: [id number] DOB: 1929/06/20 Today's Date: 12/20/2015    History of present illness 82 yo admitted after fall with Right femur fx s/p IM nail . PMHx: CABG, HTN, COPD, aFib, CHF, DM, CAD   OT comments  RN states okay to work with pt. Pt. Able to complete bed mobility and stand pivot to recliner with mod a.  Requires max encouragement for initiation and participation.  Per granddaughter that was present, this is "normal" for her.   Follow Up Recommendations  SNF;Supervision/Assistance - 24 hour    Equipment Recommendations       Recommendations for Other Services      Precautions / Restrictions Precautions Precautions: Fall Precaution Comments: watch HR and O2 Restrictions Weight Bearing Restrictions: Yes RLE Weight Bearing: Weight bearing as tolerated       Mobility Bed Mobility Overal bed mobility: Needs Assistance Bed Mobility: Supine to Sit     Supine to sit: Mod assist;HOB elevated    General bed mobility comments: pt. was able to move b les with instructional cues and scoot towards eob.  inititated moving her hips towards eob with LUE support on bed rail  Transfers Overall transfer level: Needs assistance Equipment used: Rolling walker (2 wheeled) Transfers: Sit to/from UGI CorporationStand;Stand Pivot Transfers Sit to Stand: Mod assist Stand pivot transfers: Mod assist       General transfer comment: pt. appeared to do better (with g.dtrs instruction) to tell pt. what to do vs. ask, "lets go sit over there" and pt. was able to manage o2 tubing and initiate pivotal steps    Balance Overall balance assessment: Needs assistance;History of Falls Sitting-balance support: Bilateral upper extremity supported;Feet supported Sitting balance-Leahy Scale: Poor                             ADL Overall ADL's : Needs assistance/impaired                         Toilet Transfer: Minimal  assistance;RW;Stand-pivot Toilet Transfer Details (indicate cue type and reason): simulated during functional mobility in room eob to recliner Toileting- Clothing Manipulation and Hygiene: Maximal assistance;Sit to/from stand Toileting - Clothing Manipulation Details (indicate cue type and reason): simulated during mobility in room       General ADL Comments: pts. g.dtr present and reports pt. always requires max encouragement for particiaption.       Vision                     Perception     Praxis      Cognition   Behavior During Therapy: [facility]on Memorial HospitalWFL for tasks assessed/performed Overall Cognitive Status: No family/caregiver present to determine baseline cognitive functioning       Memory: Decreased short-term memory  Following Commands: Follows one step commands with increased time Safety/Judgement: Decreased awareness of safety;Decreased awareness of deficits Awareness: Intellectual Problem Solving: Slow processing;Decreased initiation;Requires verbal cues General Comments: pt repeatedly asking "Did I brake my hip?".  pt needing re-orientation multiple times throughout session, but was aware she was in the hospital, just not which one.      Extremity/Trunk Assessment               Exercises     Shoulder Instructions       General Comments      Pertinent Vitals/ Pain  Pain Assessment:  (did not rate but grimacing with positional changes) Faces Pain Scale: Hurts even more Pain Location: R hip during bed mobility Pain Descriptors / Indicators: Grimacing;Guarding;Moaning Pain Intervention(s): Repositioned;Monitored during session;Premedicated before session  Home Living                                          Prior Functioning/Environment              Frequency Min 2X/week     Progress Toward Goals  OT Goals(current goals can now be found in the care plan section)  Progress towards OT goals: Progressing toward  goals  Acute Rehab OT Goals Patient Stated Goal: be able to walk and return home  Plan Discharge plan remains appropriate    Co-evaluation                 End of Session Equipment Utilized During Treatment: Gait belt;Rolling walker;Oxygen   Activity Tolerance Patient tolerated treatment well   Patient Left in chair;with call bell/phone within reach;with family/visitor present   Nurse Communication Other (comment) (rn indicated it was okay to work with pt., reviewed transfer to chair with rn at end of session for inst. for back to bed)        Time: 1610-9604 OT Time Calculation (min): 15 min  Charges: OT General Charges $OT Visit: 1 Procedure OT Treatments $Self Care/Home Management : 8-22 mins  Robet Leu, COTA/L 12/20/2015, 11:40 AM

## 2015-12-21 DIAGNOSIS — W1839XA Other fall on same level, initial encounter: Secondary | ICD-10-CM

## 2015-12-21 DIAGNOSIS — W010XXA Fall on same level from slipping, tripping and stumbling without subsequent striking against object, initial encounter: Secondary | ICD-10-CM

## 2015-12-21 DIAGNOSIS — J441 Chronic obstructive pulmonary disease with (acute) exacerbation: Secondary | ICD-10-CM

## 2015-12-21 LAB — BASIC METABOLIC PANEL
ANION GAP: 8 (ref 5–15)
BUN: 27 mg/dL — ABNORMAL HIGH (ref 6–20)
CHLORIDE: 99 mmol/L — AB (ref 101–111)
CO2: 26 mmol/L (ref 22–32)
Calcium: 9.4 mg/dL (ref 8.9–10.3)
Creatinine, Ser: 0.9 mg/dL (ref 0.44–1.00)
GFR, EST NON AFRICAN AMERICAN: 57 mL/min — AB (ref >60)
GLUCOSE: 137 mg/dL — AB (ref 65–99)
Potassium: 5.2 mmol/L — ABNORMAL HIGH (ref 3.5–5.1)
Sodium: 133 mmol/L — ABNORMAL LOW (ref 135–145)

## 2015-12-21 LAB — GLUCOSE, CAPILLARY: GLUCOSE-CAPILLARY: 167 mg/dL — AB (ref 65–99)

## 2015-12-21 LAB — POTASSIUM: POTASSIUM: 4.8 mmol/L (ref 3.5–5.1)

## 2015-12-21 MED ORDER — HYDROCODONE-ACETAMINOPHEN 5-325 MG PO TABS: 1.0000 | ORAL_TABLET | Freq: Four times a day (QID) | ORAL | 0 refills | Status: DC | PRN

## 2015-12-21 MED ORDER — BISACODYL 10 MG RE SUPP: 10.0000 mg | Freq: Every day | RECTAL | 0 refills | Status: DC | PRN

## 2015-12-21 MED ORDER — PREDNISONE 20 MG PO TABS: 20.0000 mg | ORAL_TABLET | Freq: Every day | ORAL | 0 refills | Status: DC

## 2015-12-21 MED ORDER — ACETAMINOPHEN 500 MG PO TABS: 500.0000 mg | ORAL_TABLET | Freq: Three times a day (TID) | ORAL | 0 refills | Status: DC

## 2015-12-21 MED ORDER — DIAZEPAM 2 MG PO TABS: 2.0000 mg | ORAL_TABLET | Freq: Four times a day (QID) | ORAL | 0 refills | Status: AC | PRN

## 2015-12-21 MED ORDER — ALPRAZOLAM 0.25 MG PO TABS: 0.2500 mg | ORAL_TABLET | Freq: Every evening | ORAL | 0 refills | Status: AC | PRN

## 2015-12-21 MED ORDER — FERROUS SULFATE 325 (65 FE) MG PO TABS: 325.0000 mg | ORAL_TABLET | Freq: Three times a day (TID) | ORAL | 3 refills | Status: DC

## 2015-12-21 NOTE — Discharge Summary (Signed)
Physician Discharge Summary  [name] GNF:[id number] DOB: Sep 02, 1929 DOA: 12/16/2015  PCP: Dois Davenport., MD  Admit date: 12/16/2015 Discharge date: 12/21/2015  Time spent: 65 minutes  Recommendations for Outpatient Follow-up:  1. Patient be discharged to skilled nursing facility at collapse. Patient will follow-up with M.D. at skilled nursing facility. Patient will need a basic metabolic profile and a CBC done in 1 week to follow-up on electrolytes renal function and hemoglobin. Patient's Lantus was discontinued on discharge. Follow CBGs and if significantly elevated may consider resuming patient's home regimen of Lantus 10 units daily. 2. Follow-up with Dr. Dion Saucier of orthopedics in 2 weeks.   Discharge Diagnoses:  Principal Problem:   Closed intertrochanteric fracture of hip (HCC) Active Problems:   Postoperative anemia due to acute blood loss   Hypertension   Aneurysm, aortic (HCC)   Syncope   Carotid artery disease (HCC)   Chronic respiratory failure (HCC)   Atrial fibrillation (HCC)   COPD exacerbation (HCC)   Coronary artery disease involving native coronary artery of native heart with angina pectoris (HCC)   Insulin dependent type 2 diabetes mellitus (HCC)   DNR (do not resuscitate)   Pressure ulcer   Aneurysm of abdominal aorta (HCC)   Fall from slip, trip, or stumble   Discharge Condition: Stable and improved  Diet recommendation: Heart healthy  Filed Weights   12/16/15 1804 12/16/15 2114  Weight: 77.1 kg (170 lb) 64.7 kg (142 lb 11.2 oz)    History of present illness:  Per Dr Susie Cassette [place] Muta Reynoldsis a 82 y.o.female with a history of CABG, hypertension, ascending and descending aortic aneurysm, 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 She reported  hitting her head, but no LOC. Reported headache at right temple, site of where she hit her head. She had right hip pain, 10/10, unable to ambulate secondary to pain. She also endorsed a small wound on right elbow from fall. She is not anticoagulation candidate due to GI bleed in the past. Cardiac hx with a fib, chronic , CAD with CABG in 2005  . Last cath 06/2012:Severe native coronary disease as described.  Last echo 10/2014  The estimated ejection fraction was in the range of 55% to 60%. - Pulmonary arteries: PA peak pressure: 35 mm Hg (S).  In the ER the patient had the following workup-Right elbow x-ray negative for acute fracture or dislocation. CT head and neck negative for acute abnormality. CXR remarkable for b/l pleural effusions and atelectasis vs. Early infiltrates - patient is afebrile, denies cough, suspect atelectasis. Hip/pelvis x-ray remarkable for right intertrochanteric fracture with varus angulation.Dr. Osie Bond of orthopedics requested preop clearance due to multiple cardiovascular risk factors  Hospital Course:  #1 intertrochanteric fracture of the right hip Secondary to mechanical fall. Orthopedics was consulted as well as cardiology for preop clearance. Patient Status post IM nail to right femoral per Dr. Dion Saucier orthopedics on 12/17/2015. Patient tolerated procedure well. Patient was hesitant to take narcotic pain medications and a such was placed on scheduled Tylenol as well as Norco as needed. Patient was seen by physical therapy and patient will be discharged to a skilled nursing facility. Patient will follow-up with Dr. Dion Saucier orthopedics in 2 weeks.  #2 postop anemia due to acute blood loss Hemoglobin currently at 8.8 from 8.6 from 6.6 on 12/18/2015. Patient with  no overt bleeding. Patient status post 2 units packed red blood cells. Anemia panel pending. Transfusion threshold hemoglobin less than 7. Patient was placed on oral iron supplementation.  #3  hypertension Patient's blood pressure has been running low and as such diuretics were held. Blood pressure improved.   #4 insulin-dependent type 2 diabetes Hemoglobin A1c was 5.0. CBGs have ranged from 102 - 157. Patient Lantus was discontinued during the hospitalization. Patient was maintained on sliding scale insulin. On discharge patient's Lantus will be discontinued and patient maintained on sliding scale insulin. If CBGs increase may consider resuming patient's Lantus.   #5 aortic root aneurysm CT angiogram chest negative for intrathoracic aortic aneurysm.   #6 carotid artery disease status post endarterectomy,  Doppler, 2011 stable, 0-39% R ICA, 40-59% LICA cardiac enzymes negative.  #7 history of atrial fibrillation Patient was maintained on digoxin and Cardizem for rate control. Patient not an anticoagulation candidate due to increased risk of falls. Patient be discharged on aspirin.  # 8 COPD exacerbation  During the hospitalization patient was noted to have some wheezing on examination and a such was placed on IV steroids, scheduled nebulizers, Dulera, Spiriva. Patient improved clinically during the hospitalization IV steroids were transitioned to oral prednisone. Patient be discharged on a prednisone taper. Patient be discharged in stable and improved condition. Patient is chronically on home O2.  #9 diastolic CHF Compensated. Lasix was held secondary to low blood pressure. Patient's blood pressure improved. Patient's Lasix will be resumed on discharge. Patient is taking Lasix on a when necessary basis.     Procedures:  CT head /CT C-spine 12/16/2015  Chest x-ray 12/18/2015, 12/16/2015  CT angiogram chest/abdomen/pelvis 12/16/2015  Plain films right femur 12/17/2015  Plain films right hip and pelvis 12/16/2015  Intramedullary nail right femoral per Dr. Dion SaucierLandau 12/17/2015  2 units packed red blood cells 12/18/2015  2-D echo  12/19/2015  Consultations:  Cardiology: Dr. Anne FuSkains 12/16/2015  Orthopedics: Dr. Dion SaucierLandau 12/17/2015   Discharge Exam: Vitals:   12/21/15 0411 12/21/15 0931  BP: (!) 146/84 136/67  Pulse: 94 86  Resp: 18   Temp: 97.5 F (36.4 C)     General: NAD Cardiovascular: RRR Respiratory: CTAB  Discharge Instructions   Discharge Instructions    Diet - low sodium heart healthy    Complete by:  As directed   Weight bearing as tolerated    Complete by:  As directed     Current Discharge Medication List    START taking these medications   Details  acetaminophen (TYLENOL) 500 MG tablet Take 1 tablet (500 mg total) by mouth 3 (three) times daily. Qty: 30 tablet, Refills: 0    bisacodyl (DULCOLAX) 10 MG suppository Place 1 suppository (10 mg total) rectally daily as needed for moderate constipation. Qty: 12 suppository, Refills: 0    enoxaparin (LOVENOX) 40 MG/0.4ML injection Inject 0.4 mLs (40 mg total) into the skin daily. Qty: 21 Syringe, Refills: 0    ferrous sulfate 325 (65 FE) MG tablet Take 1 tablet (325 mg total) by mouth 3 (three) times daily after meals. Refills: 3    HYDROcodone-acetaminophen (NORCO) 5-325 MG tablet Take 1-2 tablets by mouth every 6 (six) hours as needed for moderate pain. MAXIMUM TOTAL ACETAMINOPHEN DOSE IS 4000 MG PER DAY Qty: 50 tablet, Refills: 0    sennosides-docusate sodium (SENOKOT-S) 8.6-50 MG tablet Take 2 tablets by mouth daily. Qty: 30 tablet, Refills: 1      CONTINUE these medications which have CHANGED   Details  ALPRAZolam (XANAX) 0.25 MG tablet Take 1 tablet (0.25 mg total) by mouth at bedtime as needed for anxiety. Qty: 10 tablet, Refills: 0    diazepam (VALIUM) 2 MG tablet Take 1 tablet (2 mg total) by mouth every 6 (six) hours as needed for anxiety. Qty: 10 tablet, Refills: 0    predniSONE (DELTASONE) 20 MG tablet Take 1-3 tablets (20-60 mg total) by mouth daily before breakfast. Take 3 tablets (60mg ) daily x 1 day, then 2  tablets (40mg ) daily x 3 days, then 1 tablet (20mg ) daily x 3 days then stop. Qty: 12 tablet, Refills: 0      CONTINUE these medications which have NOT CHANGED   Details  albuterol (PROVENTIL HFA;VENTOLIN HFA) 108 (90 BASE) MCG/ACT inhaler Inhale 2 puffs into the lungs every 4 (four) hours as needed for wheezing or shortness of breath. Qty: 1 Inhaler, Refills: 5    albuterol (PROVENTIL) (2.5 MG/3ML) 0.083% nebulizer solution Take 2.5 mg by nebulization every 6 (six) hours as needed for wheezing.    aspirin EC 81 MG tablet Take 81 mg by mouth daily.    atorvastatin (LIPITOR) 40 MG tablet Take 0.5 tablets (20 mg total) by mouth daily at 6 PM. Qty: 90 tablet, Refills: 1    Digoxin 62.5 MCG TABS Take 0.0625 mg by mouth daily. Qty: 90 tablet, Refills: 2   Associated Diagnoses: Acute exacerbation of chronic obstructive pulmonary disease (COPD) (HCC); Atrial fibrillation with RVR (HCC); Essential hypertension; Chronic diastolic congestive heart failure (HCC); Hyperlipidemia; Chronic diastolic CHF (congestive heart failure) (HCC); Peripheral arterial disease (HCC); Coronary artery disease involving native coronary artery of native heart with angina pectoris (HCC)    diltiazem (DILACOR XR) 120 MG 24 hr capsule Take 1 capsule (120 mg total) by mouth daily. Qty: 90 capsule, Refills: 6   Associated Diagnoses: Acute exacerbation of chronic obstructive pulmonary disease (COPD) (HCC); Atrial fibrillation with RVR (HCC); Essential hypertension; Chronic diastolic congestive heart failure (HCC); Hyperlipidemia; Chronic diastolic CHF (congestive heart failure) (HCC); Peripheral arterial disease (HCC); Coronary artery disease involving native coronary artery of native heart with angina pectoris [facility])    !! feeding supplement (BOOST / RESOURCE BREEZE) LIQD Take 1 Container by mouth 2 (two) times daily as needed (for supplementatioin).     furosemide (LASIX) 40 MG tablet Take 1 tablet (40 mg total) by mouth  daily as needed for fluid or edema. Qty: 90 tablet, Refills: 6   Associated Diagnoses: Acute exacerbation of chronic obstructive pulmonary disease (COPD) (HCC); Atrial fibrillation with RVR (HCC); Essential hypertension; Chronic diastolic congestive heart failure (HCC); Hyperlipidemia; Chronic diastolic CHF (congestive heart failure) (HCC); Peripheral arterial disease (HCC); Coronary artery disease involving native coronary artery of native heart with angina pectoris (HCC)    insulin aspart (NOVOLOG) 100 UNIT/ML injection Before each meal 3 times a day, 140-199 - 2 units, 200-250 - 4 units, 251-299 - 6 units,  300-349 - 8 units,  350 or above 10 units. Dispense syringes and needles as needed, Ok to switch to PEN if approved. Substitute to any brand approved. DX DM2, Code E11.65 Qty: 1 vial, Refills: 12    magnesium oxide (MAG-OX) 400 MG tablet Take 400 mg by mouth daily.    nitroGLYCERIN (NITROSTAT) 0.4 MG SL tablet Place 0.4 mg under the tongue every 5 (five) minutes as needed for chest pain.    ondansetron (ZOFRAN) 8 MG tablet Take 1 tablet (8 mg total) by mouth every 8 (eight) hours as needed for nausea or vomiting. Qty: 20  tablet, Refills: 0    OXYGEN Inhale 2-2.5 L into the lungs continuous. 2L continously    pantoprazole (PROTONIX) 40 MG tablet Take 1 tablet (40 mg total) by mouth daily. Qty: 90 tablet, Refills: 3    polyethylene glycol (MIRALAX / GLYCOLAX) packet Take 17 g by mouth daily as needed for mild constipation or moderate constipation. Reported on 07/10/2015    SYMBICORT 160-4.5 MCG/ACT inhaler USE 2 INHALATIONS TWICE A DAY Qty: 30.6 g, Refills: 0    Tiotropium Bromide Monohydrate (SPIRIVA RESPIMAT) 2.5 MCG/ACT AERS Inhale 2 puffs into the lungs daily.    Amino Acids-Protein Hydrolys (FEEDING SUPPLEMENT, PRO-STAT SUGAR FREE 64,) LIQD Take 30 mLs by mouth daily. Qty: 900 mL, Refills: 0    !! feeding supplement, ENSURE ENLIVE, (ENSURE ENLIVE) LIQD Take 237 mLs by mouth  daily. Qty: 237 mL, Refills: 12     !! - Potential duplicate medications found. Please discuss with provider.    STOP taking these medications     insulin glargine (LANTUS) 100 UNIT/ML injection      potassium chloride SA (K-DUR,KLOR-CON) 20 MEQ tablet      senna (SENOKOT) 8.6 MG TABS tablet      SPIRIVA HANDIHALER 18 MCG inhalation capsule        Allergies  Allergen Reactions  . Other Other (See Comments)    TETANUS-can only take 1/2 dose at one time, can take the other 1/2 dose about 3 days later  . Codeine Nausea And Vomiting  . Sulfonamide Derivatives Nausea And Vomiting  . Warfarin     H/o AVM's prechief complaintcluding anticoagulation  . Penicillins Itching, Rash and Other (See Comments)    Nasal itching   . Ranitidine Hcl Itching and Rash   Follow-up Information    LANDAU,JOSHUA P, MD. Schedule an appointment as soon as possible for a visit in 2 week(s).   Specialty:  Orthopedic Surgery Contact information: [address] [place] [address] [phone]        MD AT [facility]F .   Why:  F/U WITH MD AT SNF           The results of significant diagnostics from this hospitalization (including imaging, microbiology, ancillary and laboratory) are listed below for reference.    Significant Diagnostic Studies: Dg Chest 1 View  Result Date: 12/16/2015 CLINICAL DATA:  Right hip fracture.  Fell today. EXAM: CHEST 1 VIEW COMPARISON:  12/16/2015.  Rash that earlier today. FINDINGS: Enlarged cardiac silhouette with post CABG changes. The pulmonary vasculature is prominent. Continued evidence of bilateral pleural fluid, greater on the right compared to the left. Probable calcified granuloma in the right lower lung zone. Diffuse osteopenia. Atheromatous arterial calcifications, including the thoracic aorta. IMPRESSION: 1. Pulmonary vascular congestion and mild cardiomegaly. 2. Small bilateral pleural effusions. 3. Aortic atherosclerosis. Electronically  Signed   By: Beckie Salts M.D.   On: 12/16/2015 20:12   Dg Chest 2 View  Result Date: 12/16/2015 CLINICAL DATA:  Patient comes from home, states got up to the restroom and fell. Right leg is externally rotated. Pt having right hip pain and right elbow pain, pt has small laceration to posterior elbow. Hx HTN, TB, CABG, COPD, afib, CHF, CAD EXAM: CHEST  2 VIEW COMPARISON:  07/10/2015 FINDINGS: Status post median sternotomy and CABG. The heart is normal in size. There are bilateral pleural effusions and bibasilar opacities consistent atelectasis or early infiltrates. There is no pneumothorax. Patient is kyphotic with wedge compression fractures at numerous thoracic  levels, likely chronic. IMPRESSION: 1. Bilateral pleural effusions and bibasilar atelectasis or early infiltrates. 2. No acute fracture. Electronically Signed   By: Norva Pavlov M.D.   On: 12/16/2015 15:13   Dg Elbow Complete Right  Result Date: 12/16/2015 CLINICAL DATA:  Patient comes from home, states got up to the restroom and fell. Right leg is externally rotated. Pt having right hip pain and right elbow pain, pt has small laceration to posterior elbow. Hx HTN, TB, CABG, COPD, afib, CHF, CAD EXAM: RIGHT ELBOW - COMPLETE 3+ VIEW COMPARISON:  None. FINDINGS: There is no evidence of fracture, dislocation, or joint effusion. There is no evidence of arthropathy or other focal bone abnormality. Soft tissues are unremarkable. IMPRESSION: Negative. Electronically Signed   By: Norva Pavlov M.D.   On: 12/16/2015 15:06   Dg Tibia/fibula Right  Result Date: 12/16/2015 CLINICAL DATA:  Large right lower leg hematoma superior and medial to the midportion of the lower leg after a fall today. Right hip fracture. EXAM: RIGHT TIBIA AND FIBULA - 2 VIEW COMPARISON:  Right hip radiographs obtained earlier today. FINDINGS: The frontal view is oblique and does not include the distal portion of the lower leg. The lateral view does not include the proximal  portion of the lower leg. Diffuse osteopenia is demonstrated as well as atheromatous arterial calcifications. No fracture or dislocation is seen on these views. IMPRESSION: Limited examination with no fracture or dislocation seen. A routine two-view examination is recommended when possible. Electronically Signed   By: Beckie Salts M.D.   On: 12/16/2015 20:10   Ct Head Wo Contrast  Result Date: 12/16/2015 CLINICAL DATA:  Fall in bathroom at home.  Right scalp hematoma EXAM: CT HEAD WITHOUT CONTRAST CT CERVICAL SPINE WITHOUT CONTRAST TECHNIQUE: Multidetector CT imaging of the head and cervical spine was performed following the standard protocol without intravenous contrast. Multiplanar CT image reconstructions of the cervical spine were also generated. COMPARISON:  05/01/2015 FINDINGS: CT HEAD FINDINGS There is atrophy and chronic small vessel disease changes. No acute intracranial abnormality. Specifically, no hemorrhage, hydrocephalus, mass lesion, acute infarction, or significant intracranial injury. Mild soft tissue swelling in the right temporal region. Visualized paranasal sinuses and mastoids clear. Orbital soft tissues unremarkable. CT CERVICAL SPINE FINDINGS Normal alignment. Degenerative disc and facet disease throughout the cervical spine. Prevertebral soft tissues are normal. No fracture. No epidural or paraspinal hematoma. Airspace opacities noted in the apices bilaterally, right greater than left. This likely reflects scarring, but elective chest CT would be helpful to completely exclude mass, particularly in the right apex. IMPRESSION: No acute intracranial abnormality.Atrophy, chronic microvascular disease. Cervical spondylosis.  No acute bony abnormality. Densities in the apices bilaterally, likely scarring. However, elective chest CT would be helpful to completely exclude mass in the right upper lobe. Electronically Signed   By: Charlett Nose M.D.   On: 12/16/2015 15:30   Ct Cervical Spine Wo  Contrast  Result Date: 12/16/2015 CLINICAL DATA:  Fall in bathroom at home.  Right scalp hematoma EXAM: CT HEAD WITHOUT CONTRAST CT CERVICAL SPINE WITHOUT CONTRAST TECHNIQUE: Multidetector CT imaging of the head and cervical spine was performed following the standard protocol without intravenous contrast. Multiplanar CT image reconstructions of the cervical spine were also generated. COMPARISON:  05/01/2015 FINDINGS: CT HEAD FINDINGS There is atrophy and chronic small vessel disease changes. No acute intracranial abnormality. Specifically, no hemorrhage, hydrocephalus, mass lesion, acute infarction, or significant intracranial injury. Mild soft tissue swelling in the right temporal region. Visualized paranasal sinuses  and mastoids clear. Orbital soft tissues unremarkable. CT CERVICAL SPINE FINDINGS Normal alignment. Degenerative disc and facet disease throughout the cervical spine. Prevertebral soft tissues are normal. No fracture. No epidural or paraspinal hematoma. Airspace opacities noted in the apices bilaterally, right greater than left. This likely reflects scarring, but elective chest CT would be helpful to completely exclude mass, particularly in the right apex. IMPRESSION: No acute intracranial abnormality.Atrophy, chronic microvascular disease. Cervical spondylosis.  No acute bony abnormality. Densities in the apices bilaterally, likely scarring. However, elective chest CT would be helpful to completely exclude mass in the right upper lobe. Electronically Signed   By: Charlett Nose M.D.   On: 12/16/2015 15:30   Pelvis Portable  Result Date: 12/17/2015 CLINICAL DATA:  Postop day 0 ORIF intertrochanteric right femoral neck fracture with intramedullary nail. EXAM: PORTABLE PELVIS 1-2 VIEWS 1:57 p.m.: COMPARISON:  Intraoperative right femur x-rays earlier today at 12:45 p.m. Right hip x-rays 12/16/2015. FINDINGS: ORIF of the intertrochanteric right femoral neck fracture with an intramedullary nail.  Alignment appears near anatomic in the AP projection. Generalized osseous demineralization. Prior left hip arthroplasty with anatomic alignment. IMPRESSION: Near anatomic alignment in the AP projection post ORIF of the intertrochanteric right femoral neck fracture. Electronically Signed   By: Hulan Saas M.D.   On: 12/17/2015 14:13   Dg Chest Port 1 View  Result Date: 12/18/2015 CLINICAL DATA:  Shortness of breath tonight. EXAM: PORTABLE CHEST 1 VIEW COMPARISON:  Chest radiographs 12/16/2015. FINDINGS: Patient is post median sternotomy. Cardiomegaly with tortuous atherosclerotic thoracic aorta, unchanged. Vascular congestion and cephalization of pulmonary vasculature, again seen. Bilateral pleural effusions, possible increase on the right. Associated hazy opacity at the lung bases likely atelectasis. Right upper lobe calcified granulomas versus scarring. No new move IMPRESSION: Bilateral pleural effusions, with increased pleural fluid on the right. Mild cardiomegaly and vascular congestion, stable. Electronically Signed   By: Rubye Oaks M.D.   On: 12/18/2015 02:25   Ct Angio Chest/abd/pel For Dissection W And/or W/wo  Result Date: 12/16/2015 CLINICAL DATA:  Larey Seat today shortness of breath. Tachypnea. Diffusely decreased breath sounds. History of abdominal aortic aneurysm and COPD. Clinical concern for aortic dissection. EXAM: CT ANGIOGRAPHY CHEST, ABDOMEN AND PELVIS TECHNIQUE: Multidetector CT imaging through the chest, abdomen and pelvis was performed using the standard protocol during bolus administration of intravenous contrast. Multiplanar reconstructed images and MIPs were obtained and reviewed to evaluate the vascular anatomy. CONTRAST:  100 cc Isovue 370 COMPARISON:  Abdomen and pelvis CT examinations dated 11/18/2014 and 05/02/2014. Chest radiographs dated 12/16/2015. FINDINGS: CTA CHEST FINDINGS Cardiovascular: Extensive atheromatous calcifications, including the thoracic aorta and  coronary arteries. No aneurysm or dissection seen. Mediastinum:  No enlarged lymph nodes. Lungs and pleura: Small to moderate-sized right pleural effusion and small left pleural effusion. Associated mild right lower lobe atelectasis and minimal left lower lobe atelectasis. Diffuse bullous changes throughout both lungs. Biapical pleural and parenchymal scarring. 4 mm right upper lobe nodule on image number 32 of series 506. Musculoskeletal:  Thoracic spine degenerative changes. Review of the MIP images confirms the above findings. CTA ABDOMEN AND PELVIS FINDINGS Vascular: Atheromatous arterial calcifications. Ectasia of the infrarenal abdominal aorta with a maximum diameter of the 2.9 cm. This previously measured 3.2 cm. No dissection. Hepatobiliary: Cholecystectomy clips. Interval biliary air. No significant change in a small probable cyst in the anterior aspect of the left lobe of the liver, measuring 4 mm on image number 143 of series 501. Pancreas:  Air in the common duct.  Mild diffuse pancreatic atrophy. Spleen:  Small cysts. Adrenal/ urinary tract: Normal appearing adrenal glands. Interval small cortical indentation or angiomyolipoma in the lateral aspect of the lower pole of the left kidney. Unremarkable ureters and urinary bladder. Gastrointestinal: Multiple colonic diverticula. No gastric or small bowel abnormalities. Normal appearing appendix. Lymph nodes:  No enlarged lymph nodes. Other:  Tiny umbilical hernia containing fat. Musculoskeletal: Left hip prosthesis with associated streak artifacts. Comminuted right intertrochanteric fracture with varus angulation and posterior angulation of the distal fragment. Mild anterior displacement of the distal fragment approximately 15% L1 vertebral compression deformity with minimal bony retropulsion. No acute fracture lines. Review of the MIP images confirms the above findings. IMPRESSION: 1. No aortic dissection. 2. Ectatic infrarenal abdominal aorta with a  maximum diameter of 2.9 cm. 3. Aortic atherosclerosis and coronary artery atherosclerosis. 4. Small to moderate-sized right pleural effusion and small left pleural effusion. 5. 4 mm right upper lobe nodule. 6. COPD. 7. Interval biliary air. This could be due to recent passage of a common duct stone. 8. Extensive colonic diverticulosis. 9. Comminuted right intertrochanteric fracture with varus and posterior angulation of the distal fragment. 10. Old 50% L1 vertebral compression fracture. Electronically Signed   By: Beckie Salts M.D.   On: 12/16/2015 19:53   Dg Hip Unilat With Pelvis 2-3 Views Right  Result Date: 12/16/2015 CLINICAL DATA:  Fall.  Right hip pain. EXAM: DG HIP (WITH OR WITHOUT PELVIS) 2-3V RIGHT COMPARISON:  None. FINDINGS: Changes of prior left hip replacement. There is the comminuted right femoral intertrochanteric fracture with varus angulation. No subluxation or dislocation. IMPRESSION: Right intertrochanteric fracture with varus angulation. Electronically Signed   By: Charlett Nose M.D.   On: 12/16/2015 15:09   Dg Femur, Min 2 Views Right  Result Date: 12/17/2015 CLINICAL DATA:  Right femoral fracture EXAM: RIGHT FEMUR 2 VIEWS COMPARISON:  None. FLUOROSCOPY TIME:  Radiation Exposure Index (as provided by the fluoroscopic device): Not available If the device does not provide the exposure index: Fluoroscopy Time:  1 minutes 31 seconds Number of Acquired Images:  4 FINDINGS: Medullary rod is noted in the right femur. Proximal and distal fixation screws are noted. The intratrochanteric fracture is in near anatomic alignment. IMPRESSION: Status post ORIF of proximal right femoral fracture Electronically Signed   By: Alcide Clever M.D.   On: 12/17/2015 13:40    Microbiology: Recent Results (from the past 240 hour(s))  Surgical pcr screen     Status: None   Collection Time: 12/16/15 10:47 PM  Result Value Ref Range Status   MRSA, PCR NEGATIVE NEGATIVE Final   Staphylococcus aureus NEGATIVE  NEGATIVE Final    Comment:        The Xpert SA Assay (FDA approved for NASAL specimens in patients over 16 years of age), is one component of a comprehensive surveillance program.  Test performance has been validated by [facility] for patients greater than or equal to 89 year old. It is not intended to diagnose infection nor to guide or monitor treatment.      Labs: Basic Metabolic Panel:  Recent Labs Lab 12/17/15 0537 12/18/15 0303 12/19/15 0422 12/20/15 0252 12/21/15 0304 12/21/15 0838  NA 136 137 135 132* 133*  --   K 4.4 4.5 4.3 4.6 5.2* 4.8  CL 101 104 103 100* 99*  --   CO2 29 29 29 28 26   --   GLUCOSE 144* 167* 119* 143* 137*  --   BUN 23* 27* 25* 24* 27*  --  CREATININE 0.88 1.04* 0.96 0.87 0.90  --   CALCIUM 9.9 8.5* 8.9 9.0 9.4  --    Liver Function Tests:  Recent Labs Lab 12/16/15 1835 12/18/15 0303  AST 15 11*  ALT 14 11*  ALKPHOS 48 37*  BILITOT 0.7 0.3  PROT 5.7* 4.2*  ALBUMIN 3.4* 2.7*   No results for input(s): LIPASE, AMYLASE in the last 168 hours. No results for input(s): AMMONIA in the last 168 hours. CBC:  Recent Labs Lab 12/16/15 1548 12/16/15 1835 12/17/15 0537 12/18/15 0303 12/18/15 1355 12/19/15 0422 12/20/15 0252  WBC 11.6* 14.3* 11.8* 10.1  --  11.7* 13.3*  NEUTROABS 10.8*  --   --   --   --   --   --   HGB 10.5* 10.7* 9.8* 6.6* 9.0* 8.6* 8.8*  HCT 34.5* 34.9* 31.8* 21.5* 29.3* 28.0* 28.0*  MCV 88.5 87.9 87.6 87.8  --  87.0 86.4  PLT 185 202 205 148*  --  134* 161   Cardiac Enzymes:  Recent Labs Lab 12/16/15 1835 12/17/15 0009 12/17/15 0537  TROPONINI <0.03 <0.03 <0.03   BNP: BNP (last 3 results)  Recent Labs  03/18/15 1605 07/10/15 0945  BNP 153.9* 157.2*    ProBNP (last 3 results) No results for input(s): PROBNP in the last 8760 hours.  CBG:  Recent Labs Lab 12/19/15 2010 12/20/15 0731 12/20/15 1057 12/20/15 1702 12/20/15 2047  GLUCAP 157* 112* 102* 127* 191*        Signed:  THOMPSON,DANIEL MD.  [facility] 12/21/2015, 10:27 AM

## 2015-12-21 NOTE — Progress Notes (Signed)
Attempted report to [facility] x1 but they were unavailable. Will try again.

## 2015-12-21 NOTE — Progress Notes (Signed)
SPORTS MEDICINE AND JOINT REPLACEMENT  Georgena Spurling, MD    Laurier Nancy, PA-C [address] [place], [place], [place]  [address]                             [phone]   PROGRESS NOTE  Subjective:  negative for Chest Pain  negative for Shortness of Breath  negative for Nausea/Vomiting   negative for Calf Pain  negative for Bowel Movement   Tolerating Diet: yes         Patient reports pain as 4 on 0-10 scale.    Objective: Vital signs in last 24 hours:   Patient Vitals for the past 24 hrs:  BP Temp Temp src Pulse Resp SpO2  12/21/15 0931 136/67 - - 86 - -  12/21/15 0411 (!) 146/84 97.5 F (36.4 C) Oral 94 18 100 %  12/20/15 2041 (!) 154/87 99.3 F (37.4 C) Oral (!) 122 18 100 %  12/20/15 1420 (!) 145/73 98.2 F (36.8 C) Oral 71 17 99 %    @flow {1959:LAST@   Intake/Output from previous day:   08/25 0701 - 08/26 0700 In: 240 [P.O.:240] Out: 650 [Urine:650]   Intake/Output this shift:   No intake/output data recorded.   Intake/Output      08/25 0701 - 08/26 0700 08/26 0701 - 08/27 0700   P.O. 240    Total Intake(mL/kg) 240 (3.7)    Urine (mL/kg/hr) 650 (0.4)    Total Output 650     Net -410             LABORATORY DATA:  Recent Labs  12/16/15 1548 12/16/15 1835 12/17/15 0537 12/18/15 0303 12/18/15 1355 12/19/15 0422 12/20/15 0252  WBC 11.6* 14.3* 11.8* 10.1  --  11.7* 13.3*  HGB 10.5* 10.7* 9.8* 6.6* 9.0* 8.6* 8.8*  HCT 34.5* 34.9* 31.8* 21.5* 29.3* 28.0* 28.0*  PLT 185 202 205 148*  --  134* 161    Recent Labs  12/16/15 1548 12/16/15 1835 12/17/15 0537 12/18/15 0303 12/19/15 0422 12/20/15 0252 12/21/15 0304 12/21/15 0838  NA 137  --  136 137 135 132* 133*  --   K 4.1  --  4.4 4.5 4.3 4.6 5.2* 4.8  CL 105  --  101 104 103 100* 99*  --   CO2 25  --  29 29 29 28 26   --   BUN 23*  --  23* 27* 25* 24* 27*  --   CREATININE 0.85 0.86 0.88 1.04* 0.96 0.87 0.90  --   GLUCOSE 189*  --  144* 167* 119* 143* 137*  --   CALCIUM 9.9  --  9.9 8.5*  8.9 9.0 9.4  --    Lab Results  Component Value Date   INR 1.02 12/17/2015   INR 1.02 05/01/2015   INR 1.32 11/18/2014    Examination:  General appearance: alert, cooperative and no distress Extremities: extremities normal, atraumatic, no cyanosis or edema  Wound Exam: clean, dry, intact   Drainage:  None: wound tissue dry  Motor Exam: Quadriceps and Hamstrings Intact  Sensory Exam: Superficial Peroneal, Deep Peroneal and Tibial normal   Assessment:    4 Days Post-Op  Procedure(s) (LRB): INTRAMEDULLARY (IM) NAIL FEMORAL (Right)  ADDITIONAL DIAGNOSIS:  Principal Problem:   Closed intertrochanteric fracture of hip (HCC) Active Problems:   Hypertension   Aneurysm, aortic (HCC)   Syncope   Carotid artery disease (HCC)   Chronic respiratory failure (HCC)  Atrial fibrillation (HCC)   COPD exacerbation (HCC)   Coronary artery disease involving native coronary artery of native heart with angina pectoris (HCC)   Insulin dependent type 2 diabetes mellitus (HCC)   DNR (do not resuscitate)   Pressure ulcer   Postoperative anemia due to acute blood loss   Aneurysm of abdominal aorta (HCC)   Fall from slip, trip, or stumble  Acute Blood Loss Anemia   Plan: Physical Therapy as ordered Weight Bearing as Tolerated (WBAT)  DVT Prophylaxis:  Lovenox  DISCHARGE PLAN: Skilled Nursing Facility/Rehab  DISCHARGE NEEDS: HHPT   Patient will be discharged to SNF, is cleared by orthopedics. Will follow up with Dr Dion SaucierLandau in 2 weeks.          Guy SandiferColby Alan Misha Vanoverbeke 12/21/2015, 11:27 AM

## 2015-12-21 NOTE — Progress Notes (Signed)
Pt continues to refuse to be repositioned. When pt finally allows repositioning pt repositions self back to supine. Pt continues to have poor memory recall. Will continue to follow.

## 2015-12-21 NOTE — Clinical Social Work Placement (Signed)
   CLINICAL SOCIAL WORK PLACEMENT  NOTE  Date:  12/21/2015  Patient Details  Name: [name] MRN: [id number] Date of Birth: 23-Oct-1929  Clinical Social Work is seeking post-discharge placement for this patient at the Skilled  Nursing Facility level of care (*CSW will initial, date and re-position this form in  chart as items are completed):  Yes   Patient/family provided with [facility] Work Department's list of facilities offering this level of care within the geographic area requested by the patient (or if unable, by the patient's family).  Yes   Patient/family informed of their freedom to choose among providers that offer the needed level of care, that participate in Medicare, Medicaid or managed care program needed by the patient, have an available bed and are willing to accept the patient.  Yes   Patient/family informed of [facility]'s ownership interest in [facility] and [facility], as well as of the fact that they are under no obligation to receive care at these facilities.  PASRR submitted to EDS on       PASRR number received on       Existing PASRR number confirmed on 12/18/15     FL2 transmitted to all facilities in geographic area requested by pt/family on 12/18/15     FL2 transmitted to all facilities within larger geographic area on       Patient informed that his/her managed care company has contracts with or will negotiate with certain facilities, including the following:        Yes   Patient/family informed of bed offers received.  Patient chooses bed at [facility] [place]     Physician recommends and patient chooses bed at      Patient to be transferred to [facility] [place] on 12/21/15.  Patient to be transferred to facility by Ambulance     Patient family notified on 12/21/15 of transfer.  Name of family member notified:  [name]      PHYSICIAN Please prepare priority discharge summary, including medications,  Please sign FL2, Please prepare prescriptions     Additional Comment:   Per MD patient ready for DC to [facility]. RN, patient, patient's family, and facility notified of DC. RN given number for report. DC packet on chart. Ambulance transport requested for patient for 1;30PM pickup. CSW informed RN that the patient's pain medication needs to be signed. CSW looked for prescription on unit and the patient's chart but prescription for pain medication was not available. RN will assist with getting this signed by ortho PA or MD. CSW signing off.  _______________________________________________ Venita Lickampbell, Sofie Schendel B, LCSW 12/21/2015, 12:00 PM

## 2015-12-23 LAB — GLUCOSE, CAPILLARY: GLUCOSE-CAPILLARY: 150 mg/dL — AB (ref 65–99)

## 2016-02-20 ENCOUNTER — Ambulatory Visit
Admission: RE | Admit: 2016-02-20 | Discharge: 2016-02-20 | Disposition: A | Discharge status: Home / Self Care | Payer: Medicare Other | Source: Ambulatory Visit | Attending: Family Medicine | Admitting: Family Medicine

## 2016-02-20 ENCOUNTER — Other Ambulatory Visit: Payer: Self-pay | Admitting: Student

## 2016-02-20 DIAGNOSIS — J690 Pneumonitis due to inhalation of food and vomit: Secondary | ICD-10-CM

## 2016-02-24 ENCOUNTER — Encounter (HOSPITAL_COMMUNITY): Payer: Self-pay

## 2016-02-24 ENCOUNTER — Observation Stay (HOSPITAL_COMMUNITY): Payer: Medicare Other

## 2016-02-24 ENCOUNTER — Inpatient Hospital Stay (HOSPITAL_COMMUNITY)
Admission: EM | Admit: 2016-02-24 | Discharge: 2016-02-28 | DRG: 193 | Disposition: A | Discharge status: Home Health | Payer: Medicare Other | Attending: Family Medicine | Admitting: Family Medicine

## 2016-02-24 ENCOUNTER — Emergency Department (HOSPITAL_COMMUNITY): Payer: Medicare Other

## 2016-02-24 DIAGNOSIS — E785 Hyperlipidemia, unspecified: Secondary | ICD-10-CM | POA: Diagnosis present

## 2016-02-24 DIAGNOSIS — Z951 Presence of aortocoronary bypass graft: Secondary | ICD-10-CM

## 2016-02-24 DIAGNOSIS — S72143D Displaced intertrochanteric fracture of unspecified femur, subsequent encounter for closed fracture with routine healing: Secondary | ICD-10-CM

## 2016-02-24 DIAGNOSIS — I482 Chronic atrial fibrillation: Secondary | ICD-10-CM | POA: Diagnosis present

## 2016-02-24 DIAGNOSIS — Y95 Nosocomial condition: Secondary | ICD-10-CM | POA: Diagnosis present

## 2016-02-24 DIAGNOSIS — F419 Anxiety disorder, unspecified: Secondary | ICD-10-CM | POA: Diagnosis present

## 2016-02-24 DIAGNOSIS — Z955 Presence of coronary angioplasty implant and graft: Secondary | ICD-10-CM

## 2016-02-24 DIAGNOSIS — J189 Pneumonia, unspecified organism: Secondary | ICD-10-CM | POA: Diagnosis not present

## 2016-02-24 DIAGNOSIS — Z794 Long term (current) use of insulin: Secondary | ICD-10-CM

## 2016-02-24 DIAGNOSIS — R0602 Shortness of breath: Secondary | ICD-10-CM | POA: Diagnosis not present

## 2016-02-24 DIAGNOSIS — I739 Peripheral vascular disease, unspecified: Secondary | ICD-10-CM

## 2016-02-24 DIAGNOSIS — E870 Hyperosmolality and hypernatremia: Secondary | ICD-10-CM | POA: Diagnosis present

## 2016-02-24 DIAGNOSIS — E1165 Type 2 diabetes mellitus with hyperglycemia: Secondary | ICD-10-CM | POA: Diagnosis not present

## 2016-02-24 DIAGNOSIS — E119 Type 2 diabetes mellitus without complications: Secondary | ICD-10-CM

## 2016-02-24 DIAGNOSIS — I2510 Atherosclerotic heart disease of native coronary artery without angina pectoris: Secondary | ICD-10-CM | POA: Diagnosis present

## 2016-02-24 DIAGNOSIS — T380X5A Adverse effect of glucocorticoids and synthetic analogues, initial encounter: Secondary | ICD-10-CM | POA: Diagnosis not present

## 2016-02-24 DIAGNOSIS — Z9981 Dependence on supplemental oxygen: Secondary | ICD-10-CM

## 2016-02-24 DIAGNOSIS — I480 Paroxysmal atrial fibrillation: Secondary | ICD-10-CM | POA: Diagnosis present

## 2016-02-24 DIAGNOSIS — J440 Chronic obstructive pulmonary disease with acute lower respiratory infection: Secondary | ICD-10-CM | POA: Diagnosis present

## 2016-02-24 DIAGNOSIS — L8990 Pressure ulcer of unspecified site, unspecified stage: Secondary | ICD-10-CM | POA: Diagnosis present

## 2016-02-24 DIAGNOSIS — I4891 Unspecified atrial fibrillation: Secondary | ICD-10-CM | POA: Diagnosis present

## 2016-02-24 DIAGNOSIS — Z7982 Long term (current) use of aspirin: Secondary | ICD-10-CM

## 2016-02-24 DIAGNOSIS — I779 Disorder of arteries and arterioles, unspecified: Secondary | ICD-10-CM | POA: Diagnosis present

## 2016-02-24 DIAGNOSIS — J9621 Acute and chronic respiratory failure with hypoxia: Secondary | ICD-10-CM | POA: Diagnosis present

## 2016-02-24 DIAGNOSIS — L89152 Pressure ulcer of sacral region, stage 2: Secondary | ICD-10-CM | POA: Diagnosis present

## 2016-02-24 DIAGNOSIS — I5032 Chronic diastolic (congestive) heart failure: Secondary | ICD-10-CM | POA: Diagnosis present

## 2016-02-24 DIAGNOSIS — I110 Hypertensive heart disease with heart failure: Secondary | ICD-10-CM | POA: Diagnosis present

## 2016-02-24 DIAGNOSIS — R0902 Hypoxemia: Secondary | ICD-10-CM

## 2016-02-24 DIAGNOSIS — Z87891 Personal history of nicotine dependence: Secondary | ICD-10-CM

## 2016-02-24 DIAGNOSIS — Z66 Do not resuscitate: Secondary | ICD-10-CM | POA: Diagnosis present

## 2016-02-24 DIAGNOSIS — J441 Chronic obstructive pulmonary disease with (acute) exacerbation: Secondary | ICD-10-CM | POA: Diagnosis present

## 2016-02-24 DIAGNOSIS — Z880 Allergy status to penicillin: Secondary | ICD-10-CM

## 2016-02-24 DIAGNOSIS — K838 Other specified diseases of biliary tract: Secondary | ICD-10-CM

## 2016-02-24 LAB — CBC WITH DIFFERENTIAL/PLATELET
BASOS PCT: 0 %
Basophils Absolute: 0 10*3/uL (ref 0.0–0.1)
EOS ABS: 0 10*3/uL (ref 0.0–0.7)
Eosinophils Relative: 0 %
HEMATOCRIT: 36.5 % (ref 36.0–46.0)
HEMOGLOBIN: 11.4 g/dL — AB (ref 12.0–15.0)
LYMPHS ABS: 0.5 10*3/uL — AB (ref 0.7–4.0)
Lymphocytes Relative: 5 %
MCH: 28.9 pg (ref 26.0–34.0)
MCHC: 31.2 g/dL (ref 30.0–36.0)
MCV: 92.6 fL (ref 78.0–100.0)
MONOS PCT: 7 %
Monocytes Absolute: 0.7 10*3/uL (ref 0.1–1.0)
NEUTROS ABS: 8.3 10*3/uL — AB (ref 1.7–7.7)
NEUTROS PCT: 88 %
Platelets: 272 10*3/uL (ref 150–400)
RBC: 3.94 MIL/uL (ref 3.87–5.11)
RDW: 14.7 % (ref 11.5–15.5)
WBC: 9.4 10*3/uL (ref 4.0–10.5)

## 2016-02-24 LAB — COMPREHENSIVE METABOLIC PANEL
ALBUMIN: 2.8 g/dL — AB (ref 3.5–5.0)
ALT: 6 U/L — ABNORMAL LOW (ref 14–54)
ANION GAP: 7 (ref 5–15)
AST: 14 U/L — ABNORMAL LOW (ref 15–41)
Alkaline Phosphatase: 69 U/L (ref 38–126)
BUN: 17 mg/dL (ref 6–20)
CALCIUM: 10.4 mg/dL — AB (ref 8.9–10.3)
CO2: 30 mmol/L (ref 22–32)
Chloride: 98 mmol/L — ABNORMAL LOW (ref 101–111)
Creatinine, Ser: 0.72 mg/dL (ref 0.44–1.00)
GFR calc non Af Amer: >60 mL/min (ref >60)
GLUCOSE: 112 mg/dL — AB (ref 65–99)
POTASSIUM: 4.8 mmol/L (ref 3.5–5.1)
SODIUM: 135 mmol/L (ref 135–145)
TOTAL PROTEIN: 6.1 g/dL — AB (ref 6.5–8.1)
Total Bilirubin: 0.6 mg/dL (ref 0.3–1.2)

## 2016-02-24 LAB — I-STAT TROPONIN, ED: TROPONIN I, POC: 0.02 ng/mL (ref 0.00–0.08)

## 2016-02-24 LAB — GLUCOSE, CAPILLARY
Glucose-Capillary: 122 mg/dL — ABNORMAL HIGH (ref 65–99)
Glucose-Capillary: 151 mg/dL — ABNORMAL HIGH (ref 65–99)

## 2016-02-24 LAB — DIGOXIN LEVEL: DIGOXIN LVL: 0.2 ng/mL — AB (ref 0.8–2.0)

## 2016-02-24 LAB — D-DIMER, QUANTITATIVE (NOT AT ARMC): D DIMER QUANT: 1.46 ug{FEU}/mL — AB (ref 0.00–0.50)

## 2016-02-24 LAB — BRAIN NATRIURETIC PEPTIDE: B Natriuretic Peptide: 98.4 pg/mL (ref 0.0–100.0)

## 2016-02-24 MED ORDER — POLYETHYLENE GLYCOL 3350 17 G PO PACK
17.0000 g | PACK | Freq: Every day | ORAL | Status: DC | PRN
Start: 2016-02-24 — End: 2016-02-28
  Administered 2016-02-27 – 2016-02-28 (×2): 17 g via ORAL
  Filled 2016-02-24 (×2): qty 1

## 2016-02-24 MED ORDER — PANTOPRAZOLE SODIUM 40 MG PO TBEC
40.0000 mg | DELAYED_RELEASE_TABLET | Freq: Every day | ORAL | Status: DC
  Administered 2016-02-25 – 2016-02-28 (×4): 40 mg via ORAL
  Filled 2016-02-24 (×4): qty 1

## 2016-02-24 MED ORDER — ALBUTEROL SULFATE (2.5 MG/3ML) 0.083% IN NEBU
2.5000 mg | INHALATION_SOLUTION | RESPIRATORY_TRACT | Status: DC | PRN
  Administered 2016-02-25: 2.5 mg via RESPIRATORY_TRACT
  Filled 2016-02-24: qty 3

## 2016-02-24 MED ORDER — IOPAMIDOL (ISOVUE-300) INJECTION 61%
INTRAVENOUS | Status: AC
  Administered 2016-02-24: 100 mL
  Filled 2016-02-24: qty 30

## 2016-02-24 MED ORDER — DILTIAZEM HCL ER COATED BEADS 120 MG PO CP24
120.0000 mg | ORAL_CAPSULE | Freq: Once | ORAL | Status: AC
  Administered 2016-02-24: 120 mg via ORAL
  Filled 2016-02-24: qty 1

## 2016-02-24 MED ORDER — DILTIAZEM HCL ER COATED BEADS 120 MG PO CP24
120.0000 mg | ORAL_CAPSULE | Freq: Every day | ORAL | Status: DC
  Administered 2016-02-25 – 2016-02-28 (×4): 120 mg via ORAL
  Filled 2016-02-24 (×6): qty 1

## 2016-02-24 MED ORDER — ONDANSETRON HCL 4 MG PO TABS: 4.0000 mg | ORAL_TABLET | Freq: Four times a day (QID) | ORAL | Status: DC | PRN

## 2016-02-24 MED ORDER — PRO-STAT SUGAR FREE PO LIQD
30.0000 mL | Freq: Every day | ORAL | Status: DC
  Administered 2016-02-25 – 2016-02-28 (×3): 30 mL via ORAL
  Filled 2016-02-24 (×4): qty 30

## 2016-02-24 MED ORDER — TIOTROPIUM BROMIDE MONOHYDRATE 2.5 MCG/ACT IN AERS: 2.0000 | INHALATION_SPRAY | Freq: Every day | RESPIRATORY_TRACT | Status: DC

## 2016-02-24 MED ORDER — IOPAMIDOL (ISOVUE-300) INJECTION 61%
INTRAVENOUS | Status: AC
  Filled 2016-02-24: qty 100

## 2016-02-24 MED ORDER — HYDROCODONE-ACETAMINOPHEN 5-325 MG PO TABS
1.0000 | ORAL_TABLET | Freq: Four times a day (QID) | ORAL | Status: DC | PRN
  Administered 2016-02-25: 2 via ORAL
  Filled 2016-02-24: qty 2

## 2016-02-24 MED ORDER — SODIUM CHLORIDE 0.9% FLUSH
3.0000 mL | Freq: Two times a day (BID) | INTRAVENOUS | Status: DC
  Administered 2016-02-25 – 2016-02-28 (×5): 3 mL via INTRAVENOUS

## 2016-02-24 MED ORDER — DIGOXIN 125 MCG PO TABS
0.0625 mg | ORAL_TABLET | Freq: Every day | ORAL | Status: DC
  Administered 2016-02-25 – 2016-02-28 (×4): 0.0625 mg via ORAL
  Filled 2016-02-24 (×4): qty 1

## 2016-02-24 MED ORDER — DIGOXIN 62.5 MCG PO TABS: 0.0625 mg | ORAL_TABLET | Freq: Every day | ORAL | Status: DC

## 2016-02-24 MED ORDER — SODIUM CHLORIDE 0.9% FLUSH
3.0000 mL | Freq: Two times a day (BID) | INTRAVENOUS | Status: DC
  Administered 2016-02-25 – 2016-02-27 (×4): 3 mL via INTRAVENOUS

## 2016-02-24 MED ORDER — ALPRAZOLAM 0.25 MG PO TABS
0.2500 mg | ORAL_TABLET | Freq: Every evening | ORAL | Status: DC | PRN
  Administered 2016-02-25: 0.25 mg via ORAL
  Filled 2016-02-24: qty 1

## 2016-02-24 MED ORDER — SODIUM CHLORIDE 0.9% FLUSH: 3.0000 mL | INTRAVENOUS | Status: DC | PRN

## 2016-02-24 MED ORDER — IPRATROPIUM-ALBUTEROL 0.5-2.5 (3) MG/3ML IN SOLN
3.0000 mL | Freq: Four times a day (QID) | RESPIRATORY_TRACT | Status: DC
  Administered 2016-02-24 – 2016-02-28 (×14): 3 mL via RESPIRATORY_TRACT
  Filled 2016-02-24 (×16): qty 3

## 2016-02-24 MED ORDER — IOPAMIDOL (ISOVUE-370) INJECTION 76%
INTRAVENOUS | Status: AC
  Administered 2016-02-24: 100 mL
  Filled 2016-02-24: qty 100

## 2016-02-24 MED ORDER — DEXTROSE 5 % IV SOLN
500.0000 mg | INTRAVENOUS | Status: DC
  Administered 2016-02-24: 500 mg via INTRAVENOUS
  Filled 2016-02-24 (×2): qty 500

## 2016-02-24 MED ORDER — HEPARIN SODIUM (PORCINE) 5000 UNIT/ML IJ SOLN
5000.0000 [IU] | Freq: Three times a day (TID) | INTRAMUSCULAR | Status: DC
  Administered 2016-02-25 – 2016-02-28 (×11): 5000 [IU] via SUBCUTANEOUS
  Filled 2016-02-24 (×12): qty 1

## 2016-02-24 MED ORDER — DIAZEPAM 2 MG PO TABS
2.0000 mg | ORAL_TABLET | Freq: Four times a day (QID) | ORAL | Status: DC | PRN
Start: 2016-02-24 — End: 2016-02-28

## 2016-02-24 MED ORDER — METHYLPREDNISOLONE SODIUM SUCC 125 MG IJ SOLR
80.0000 mg | Freq: Four times a day (QID) | INTRAMUSCULAR | Status: DC
  Administered 2016-02-25 – 2016-02-27 (×10): 80 mg via INTRAVENOUS
  Filled 2016-02-24 (×10): qty 2

## 2016-02-24 MED ORDER — BOOST / RESOURCE BREEZE PO LIQD: 1.0000 | Freq: Two times a day (BID) | ORAL | Status: DC | PRN

## 2016-02-24 MED ORDER — SODIUM CHLORIDE 0.9 % IV SOLN: 250.0000 mL | INTRAVENOUS | Status: DC | PRN

## 2016-02-24 MED ORDER — ONDANSETRON HCL 4 MG/2ML IJ SOLN: 4.0000 mg | Freq: Four times a day (QID) | INTRAMUSCULAR | Status: DC | PRN

## 2016-02-24 MED ORDER — TIOTROPIUM BROMIDE MONOHYDRATE 18 MCG IN CAPS
18.0000 ug | ORAL_CAPSULE | Freq: Every day | RESPIRATORY_TRACT | Status: DC
  Administered 2016-02-28: 18 ug via RESPIRATORY_TRACT
  Filled 2016-02-24: qty 5

## 2016-02-24 MED ORDER — MOMETASONE FURO-FORMOTEROL FUM 200-5 MCG/ACT IN AERO
2.0000 | INHALATION_SPRAY | Freq: Two times a day (BID) | RESPIRATORY_TRACT | Status: DC
  Administered 2016-02-26 – 2016-02-28 (×4): 2 via RESPIRATORY_TRACT
  Filled 2016-02-24: qty 8.8

## 2016-02-24 MED ORDER — INSULIN ASPART 100 UNIT/ML ~~LOC~~ SOLN
0.0000 [IU] | Freq: Three times a day (TID) | SUBCUTANEOUS | Status: DC
Start: 2016-02-25 — End: 2016-02-27
  Administered 2016-02-25: 1 [IU] via SUBCUTANEOUS
  Administered 2016-02-25: 5 [IU] via SUBCUTANEOUS
  Administered 2016-02-25: 2 [IU] via SUBCUTANEOUS
  Administered 2016-02-26: 3 [IU] via SUBCUTANEOUS
  Administered 2016-02-26: 2 [IU] via SUBCUTANEOUS
  Administered 2016-02-26 – 2016-02-27 (×2): 3 [IU] via SUBCUTANEOUS

## 2016-02-24 MED ORDER — ASPIRIN EC 81 MG PO TBEC
81.0000 mg | DELAYED_RELEASE_TABLET | Freq: Every day | ORAL | Status: DC
  Administered 2016-02-25 – 2016-02-28 (×4): 81 mg via ORAL
  Filled 2016-02-24 (×4): qty 1

## 2016-02-24 NOTE — H&P (Signed)
History and Physical    [name] ZOX:[id number] DOB: 27-Feb-1930 DOA: 02/24/2016  PCP: Dois Davenport., MD  Patient coming from: home   Chief Complaint: Shortness of breath  HPI: [name] is a 82 y.o. 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 She tells me that she has had shortness of breath for a long time, but worsening in the past few weeks. She was seen by primary care physician on Thursday and was started on Levaquin without any improvement. She denies any fevers, chills, chest pain. She does admit to a cough that is sometimes productive with yellow sputum and sometimes nonproductive. She states that she always has a cough at baseline and is unable to tell me if this is any worse from baseline or not. She denies any nausea, vomiting, abdominal pain, diarrhea, dysuria, or worsening peripheral edema. She did have her right hip fracture which was repaired in August 2017. She was discharged to a skilled nursing facility afterward and subsequently went home with family.  ED Course: D-dimer was elevated and patient underwent CTA which is pending at the time of admission.  Review of Systems: As per HPI otherwise 10 point review of systems negative.   Past Medical History:  Diagnosis Date  . AAA (abdominal aortic aneurysm) (HCC)   . Aneurysm, aortic (HCC)    thoracic aorta, stable at 4.1 cm, chest CT, May, 2012  . Bradycardia   . CAD (coronary artery disease)    a. s/p CABG x5 (2005) LIMA->LAD, rSVG->diag, seq r SVG -> 1st OM and distal LCX, rSVG->PDA b. s/p BMS-prox SVG-D2 (2014)  . Carotid artery disease (HCC)    Hx of endarterectomy. Doppler October, 2011, stable, 0-39% RIC A., 40-59% LICA  . CHF  with left ventricular diastolic dysfunction, NYHA class 2 (HCC)    a. EF 55-60%, echo, April, 2013 b. EF 55-60%, mild biatrial enlargement and PASP 42 mmH  . Chronic atrial fibrillation (HCC)    Not felt to be an anticoagulation candidate secondary to hx of GIB/AVM  . Complication of anesthesia   . COPD (chronic obstructive pulmonary disease) (HCC)    on home oxygen  . GI bleed 2008   AVMs  . Hematoma    Right groin hematoma, small, post cath, April, 2014  . Hx of CABG    CABG 2005  . Hyperlipidemia   . Hypertension   . Hyponatremia    Chronic. Felt secondary to SIADH   . Intertrochanteric fracture of right hip (HCC) 12/16/2015  . Leg ulcer (HCC)    Ulcerated lesion on the anterior aspect of her left lower leg, June, 2014  . Mitral regurgitation    Mild, hospital, March, 2014  . Syncope    Nitroglycerin plus a diuretic, April, 2009  . Tobacco abuse   . Tuberculosis    Exposures to tuberculosis 1970s, has tested negative by the health Department  . Type 2 diabetes mellitus (HCC)   . Venous stasis of lower extremity    Chronic    Past Surgical History:  Procedure Laterality Date  . ABDOMINAL HYSTERECTOMY    . carotid endartercetomy    . CHOLECYSTECTOMY    . CORONARY ANGIOPLASTY WITH STENT PLACEMENT  07/18/12   severe native coronary artery disease, 99% proximal stenosis  of the SVG-D2 status post successful PTCA/PCI with a Veriflex bare-metal stent, widely patent SVGs to both the right PDA sequential OM1 to OM 2, EF 65-70% and 3+ mitral regurgitation  . CORONARY ARTERY BYPASS GRAFT  2005  . ERCP N/A 11/20/2014   Procedure: ENDOSCOPIC RETROGRADE CHOLANGIOPANCREATOGRAPHY (ERCP);  Surgeon: Meryl Dare, MD;  Location: [facility] ENDOSCOPY;  Service: Endoscopy;  Laterality: N/A;  . FEMUR IM NAIL Right 12/17/2015   Procedure: INTRAMEDULLARY (IM) NAIL FEMORAL;  Surgeon: Teryl Lucy, MD;  Location: [facility] OR;  Service: Orthopedics;  Laterality: Right;  . LEFT HEART CATHETERIZATION WITH  CORONARY ANGIOGRAM N/A 07/18/2012   Procedure: LEFT HEART CATHETERIZATION WITH CORONARY ANGIOGRAM;  Surgeon: Marykay Lex, MD;  Location: [facility] CATH LAB;  Service: Cardiovascular;  Laterality: N/A;  . PERCUTANEOUS CORONARY STENT INTERVENTION (PCI-S)  07/18/2012   Procedure: PERCUTANEOUS CORONARY STENT INTERVENTION (PCI-S);  Surgeon: Marykay Lex, MD;  Location: [facility] CATH LAB;  Service: Cardiovascular;;     reports that she quit smoking about 3 years ago. Her smoking use included Cigarettes. She has a 29.50 pack-year smoking history. She has never used smokeless tobacco. She reports that she does not drink alcohol or use drugs.  Allergies  Allergen Reactions  . Other Other (See Comments)    TETANUS-can only take 1/2 dose at one time, can take the other 1/2 dose about 3 days later  . Codeine Nausea And Vomiting  . Sulfonamide Derivatives Nausea And Vomiting  . Warfarin     H/o AVM's prechief complaintcluding anticoagulation  . Penicillins Itching, Rash and Other (See Comments)    Nasal itching   . Ranitidine Hcl Itching and Rash    Family History  Problem Relation Age of Onset  . Congestive Heart Failure Mother   . Lung cancer Father   . Stroke Sister   . Breast cancer Sister   . Breast cancer Brother   . Lung cancer Sister   . Prostate cancer Brother   . Heart attack Neg Hx   . Hypertension Neg Hx    Prior to Admission medications   Medication Sig Start Date End Date Taking? Authorizing Provider  acetaminophen (TYLENOL) 500 MG tablet Take 1 tablet (500 mg total) by mouth 3 (three) times daily. 12/21/15  Yes Rodolph Bong, MD  albuterol (PROVENTIL HFA;VENTOLIN HFA) 108 (90 BASE) MCG/ACT inhaler Inhale 2 puffs into the lungs every 4 (four) hours as needed for wheezing or shortness of breath. 07/16/14  Yes Tammy S Parrett, NP  albuterol (PROVENTIL) (2.5 MG/3ML) 0.083% nebulizer solution Take 2.5 mg by nebulization every 6 (six) hours as needed for wheezing.   Yes Historical  Provider, MD  ALPRAZolam (XANAX) 0.25 MG tablet Take 1 tablet (0.25 mg total) by mouth at bedtime as needed for anxiety. 12/21/15  Yes Rodolph Bong, MD  Amino Acids-Protein Hydrolys (FEEDING SUPPLEMENT, PRO-STAT SUGAR FREE 64,) LIQD Take 30 mLs by mouth daily. 03/26/15  Yes Palma Holter, MD  aspirin EC 81 MG tablet Take 81 mg by mouth daily.   Yes Historical Provider, MD  diazepam (VALIUM) 2 MG tablet Take 1 tablet (2 mg total) by mouth every 6 (six) hours as needed for anxiety. 12/21/15  Yes Rodolph Bong, MD  Digoxin 62.5 MCG TABS Take 0.0625 mg by mouth daily. 10/22/15  Yes Lars Masson, MD  diltiazem (DILACOR XR) 120 MG 24 hr capsule Take 1 capsule (120 mg total) by mouth daily. 10/16/15  Yes Lars Masson, MD  feeding supplement (  BOOST / RESOURCE BREEZE) LIQD Take 1 Container by mouth 2 (two) times daily as needed (for supplementatioin).    Yes Historical Provider, MD  furosemide (LASIX) 40 MG tablet Take 1 tablet (40 mg total) by mouth daily as needed for fluid or edema. 10/16/15  Yes Lars Masson, MD  HUMALOG KWIKPEN 100 UNIT/ML KiwkPen Inject 2-10 Units into the skin See admin instructions. Per sliding scale 01/17/16  Yes Historical Provider, MD  HYDROcodone-acetaminophen (NORCO) 5-325 MG tablet Take 1-2 tablets by mouth every 6 (six) hours as needed for moderate pain. MAXIMUM TOTAL ACETAMINOPHEN DOSE IS 4000 MG PER DAY 12/17/15  Yes Teryl Lucy, MD  levofloxacin (LEVAQUIN) 500 MG tablet Take 500 mg by mouth daily. 02/20/16  Yes Historical Provider, MD  magnesium oxide (MAG-OX) 400 MG tablet Take 400 mg by mouth daily.   Yes Historical Provider, MD  nitroGLYCERIN (NITROSTAT) 0.4 MG SL tablet Place 0.4 mg under the tongue every 5 (five) minutes as needed for chest pain.   Yes Historical Provider, MD  ondansetron (ZOFRAN) 8 MG tablet Take 1 tablet (8 mg total) by mouth every 8 (eight) hours as needed for nausea or vomiting. 10/28/14  Yes Mancel Bale, MD  OXYGEN Inhale  2-2.5 L into the lungs continuous. 2L continously   Yes Historical Provider, MD  pantoprazole (PROTONIX) 40 MG tablet Take 1 tablet (40 mg total) by mouth daily. 12/26/14  Yes Leone Brand, NP  polyethylene glycol (MIRALAX / GLYCOLAX) packet Take 17 g by mouth daily as needed for mild constipation or moderate constipation. Reported on 07/10/2015   Yes Historical Provider, MD  SYMBICORT 160-4.5 MCG/ACT inhaler USE 2 INHALATIONS TWICE A DAY 11/19/14  Yes Oretha Milch, MD  Tiotropium Bromide Monohydrate (SPIRIVA RESPIMAT) 2.5 MCG/ACT AERS Inhale 2 puffs into the lungs daily.   Yes Historical Provider, MD  atorvastatin (LIPITOR) 40 MG tablet Take 0.5 tablets (20 mg total) by mouth daily at 6 PM. Patient not taking: Reported on 02/24/2016 11/07/13   Rosalio Macadamia, NP  bisacodyl (DULCOLAX) 10 MG suppository Place 1 suppository (10 mg total) rectally daily as needed for moderate constipation. Patient not taking: Reported on 02/24/2016 12/21/15   Rodolph Bong, MD  enoxaparin (LOVENOX) 40 MG/0.4ML injection Inject 0.4 mLs (40 mg total) into the skin daily. Patient not taking: Reported on 02/24/2016 12/17/15   Teryl Lucy, MD  feeding supplement, ENSURE ENLIVE, (ENSURE ENLIVE) LIQD Take 237 mLs by mouth daily. Patient not taking: Reported on 02/24/2016 03/26/15   Palma Holter, MD  ferrous sulfate 325 (65 FE) MG tablet Take 1 tablet (325 mg total) by mouth 3 (three) times daily after meals. Patient not taking: Reported on 02/24/2016 12/21/15   Rodolph Bong, MD  HYDROcodone-acetaminophen (NORCO/VICODIN) 5-325 MG tablet Take 1-2 tablets by mouth every 6 (six) hours as needed for moderate pain. Patient not taking: Reported on 02/24/2016 12/21/15   Guy Sandifer, PA  insulin aspart (NOVOLOG) 100 UNIT/ML injection Before each meal 3 times a day, 140-199 - 2 units, 200-250 - 4 units, 251-299 - 6 units,  300-349 - 8 units,  350 or above 10 units. Dispense syringes and needles as needed, Ok to  switch to PEN if approved. Substitute to any brand approved. DX DM2, Code E11.65 Patient not taking: Reported on 02/24/2016 11/23/14   Leroy Sea, MD  predniSONE (DELTASONE) 20 MG tablet Take 1-3 tablets (20-60 mg total) by mouth daily before breakfast. Take 3 tablets (60mg ) daily x 1  day, then 2 tablets (40mg ) daily x 3 days, then 1 tablet (20mg ) daily x 3 days then stop. Patient not taking: Reported on 02/24/2016 12/22/15   Rodolph Bonganiel V Thompson, MD  sennosides-docusate sodium (SENOKOT-S) 8.6-50 MG tablet Take 2 tablets by mouth daily. Patient not taking: Reported on 02/24/2016 12/17/15   Teryl LucyJoshua Landau, MD    Physical Exam: Vitals:   02/24/16 1315 02/24/16 1445 02/24/16 1500 02/24/16 1530  BP: (!) 156/145 174/77 189/91 162/86  Pulse: 119 116 115 104  Resp: 25 23 (!) 28 25  SpO2: 99% 99% 99% 98%    Constitutional: NAD, calm, comfortable Eyes: PERRL, lids and conjunctivae normal ENMT: Mucous membranes are dry. Posterior pharynx clear of any exudate or lesions.Normal dentition.  Neck: normal, supple, no masses, no thyromegaly Respiratory: Wheezing bilaterally, diminished breath sounds in the bases, on nasal cannula, no accessory muscle use, + dry cough during examination  Cardiovascular: Irregular rhythm, tachycardic in the low 100s, no murmurs / rubs / gallops. No extremity edema. 2+ pedal pulses.  Abdomen: no tenderness, no masses palpated. No hepatosplenomegaly. Bowel sounds positive.  Musculoskeletal: no clubbing / cyanosis. No joint deformity upper and lower extremities. Good ROM, no contractures. Normal muscle tone.  Skin: + Stage II pressure ulcer in low back/sacrum  Neurologic: CN 2-12 grossly intact. Sensation intact, Strength 5/5 in all 4.  Psychiatric: Normal judgment and insight. Alert and oriented x 3.   Labs on Admission: I have personally reviewed following labs and imaging studies  CBC:  Recent Labs Lab 02/24/16 1412  WBC 9.4  NEUTROABS 8.3*  HGB 11.4*  HCT 36.5    MCV 92.6  PLT 272   Basic Metabolic Panel:  Recent Labs Lab 02/24/16 1412  NA 135  K 4.8  CL 98*  CO2 30  GLUCOSE 112*  BUN 17  CREATININE 0.72  CALCIUM 10.4*   GFR: CrCl cannot be calculated (Unknown ideal weight.). Liver Function Tests:  Recent Labs Lab 02/24/16 1412  AST 14*  ALT 6*  ALKPHOS 69  BILITOT 0.6  PROT 6.1*  ALBUMIN 2.8*   No results for input(s): LIPASE, AMYLASE in the last 168 hours. No results for input(s): AMMONIA in the last 168 hours. Coagulation Profile: No results for input(s): INR, PROTIME in the last 168 hours. Cardiac Enzymes: No results for input(s): CKTOTAL, CKMB, CKMBINDEX, TROPONINI in the last 168 hours. BNP (last 3 results) No results for input(s): PROBNP in the last 8760 hours. HbA1C: No results for input(s): HGBA1C in the last 72 hours. CBG: No results for input(s): GLUCAP in the last 168 hours. Lipid Profile: No results for input(s): CHOL, HDL, LDLCALC, TRIG, CHOLHDL, LDLDIRECT in the last 72 hours. Thyroid Function Tests: No results for input(s): TSH, T4TOTAL, FREET4, T3FREE, THYROIDAB in the last 72 hours. Anemia Panel: No results for input(s): VITAMINB12, FOLATE, FERRITIN, TIBC, IRON, RETICCTPCT in the last 72 hours. Urine analysis:    Component Value Date/Time   COLORURINE YELLOW 12/17/2015 0332   APPEARANCEUR CLEAR 12/17/2015 0332   LABSPEC 1.042 (H) 12/17/2015 0332   PHURINE 7.0 12/17/2015 0332   GLUCOSEU 100 (A) 12/17/2015 0332   HGBUR TRACE (A) 12/17/2015 0332   BILIRUBINUR NEGATIVE 12/17/2015 0332   KETONESUR NEGATIVE 12/17/2015 0332   PROTEINUR NEGATIVE 12/17/2015 0332   UROBILINOGEN 1.0 11/18/2014 1541   NITRITE NEGATIVE 12/17/2015 0332   LEUKOCYTESUR SMALL (A) 12/17/2015 0332   Sepsis Labs: !!!!!!!!!!!!!!!!!!!!!!!!!!!!!!!!!!!!!!!!!!!! @LABRCNTIP (procalcitonin:4,lacticidven:4) )No results found for this or any previous visit (from the past 240 hour(s)).  Radiological Exams on Admission: Dg Chest 2  View  Result Date: 02/24/2016 CLINICAL DATA:  82 year old female with congestion shortness breath for the past week. Hypertension. Subsequent encounter. EXAM: CHEST  2 VIEW COMPARISON:  02/20/2016. FINDINGS: Asymmetric mild pulmonary edema superimposed upon chronic changes. Small pleural effusions have increased in size since prior exam. Post CABG.  Heart size within normal limits. Calcified mildly tortuous aorta. Thoracic kyphosis without focal compression fracture. Prominent nipple shadows. IMPRESSION: Asymmetric mild pulmonary edema superimposed upon chronic changes. Small pleural effusions have increased size since prior exam. Aortic atherosclerosis. Electronically Signed   By: Lacy DuverneySteven  Olson M.D.   On: 02/24/2016 13:42   CXR reviewed independently. Hyperinflated lungs with bilateral pleural effusions. No focal consolidation.   EKG: Independently reviewed. A Fib, left fascicular block.   Assessment/Plan Principal Problem:   Acute on chronic respiratory failure with hypoxemia (HCC) Active Problems:   Hx of CABG   Carotid artery disease (HCC)   Atrial fibrillation (HCC)   CAD (coronary artery disease)   COPD exacerbation (HCC)   Chronic diastolic congestive heart failure (HCC)   DM type 2 (diabetes mellitus, type 2) (HCC)   Pressure ulcer   Anxiety   Acute on chronic hypoxemic respiratory failure -Requires 2 L of oxygen at baseline, now on 4 L, wean as able to maintain sat 88-92%  -Differential includes COPD (most likely), PE (CTA chest pending), pneumonia (although no fever, no leukocytosis, levaquin at home without improvement), heart failure (although BNP < 100 and does not appear fluid overloaded on exam), pleural effusions  Acute excerbation of COPD -Follows [facility]  -IV Steroids -Azithromax -Continue Dulera, Spiriva -Duonebs QID and albuterol q2h prn   Chronic diastolic heart failure  -Echo on 12/19/2015 with EF 55-60% -Only takes lasix at home as needed for  edema, will hold as patient does not appear fluid overloaded   CAD s/p CABG -Continue aspirin   Paroxysmal A Fib -CHADSVASc 7 -Not a good candidate for coumadin due to GI bleed in the past  -Continue digoxin, diltiazem, aspirin  -Tele   DM type 2, well controlled -Previously on insulin but discontinued in setting of good control and advanced age -SSI while in hospital   Anxiety. -Continue Xanax, Valium  Pressure ulcer stage 2 of low back, sacrum, POA  -Wound care -Consult wound RN  -Turn patient q2h   Recent right hip surgery after fall  -S/p intramedullary nail to right femoral on 12/17/2015    DVT prophylaxis: heparin subq Code Status: DNR, confirmed multiple times with patient  Family Communication: no family at bedside  Disposition Plan: Pending further work up and improvement, back home with family  Consults called: None  Admission status: Observation   Noralee StainJennifer Dhanush Jokerst, DO [facility] [email or web address] Password [facility] 02/24/2016, 5:37 PM

## 2016-02-24 NOTE — ED Notes (Signed)
Pt transported to CT ?

## 2016-02-24 NOTE — ED Provider Notes (Signed)
Emergency Department Provider Note   I have reviewed the triage vital signs and the nursing notes.   HISTORY  Chief Complaint Shortness of Breath   HPI [name] is a 82 y.o. female with PMH of AAA, CAD, a-fib, COPD on home O2, HTN, HLD, and DM presents to the emergency department for evaluation of worsening dyspnea. Patient states that she intermittent has difficulty like this but cannot recall exactly when these symptoms got significantly more severe. She denies any difficulty laying flat. Denies fever or chills. She denies associated chest pain or heart palpitations. She feels she's been compliant with her medications but has difficulty listing them. She lives at home with family. Review of Epic shows hip fracture and repair in August. She spent a brief period of time in a rehabilitation facility.    Past Medical History:  Diagnosis Date  . AAA (abdominal aortic aneurysm) (HCC)   . Aneurysm, aortic (HCC)    thoracic aorta, stable at 4.1 cm, chest CT, May, 2012  . Bradycardia   . CAD (coronary artery disease)    a. s/p CABG x5 (2005) LIMA->LAD, rSVG->diag, seq r SVG -> 1st OM and distal LCX, rSVG->PDA b. s/p BMS-prox SVG-D2 (2014)  . Carotid artery disease (HCC)    Hx of endarterectomy. Doppler October, 2011, stable, 0-39% RIC A., 40-59% LICA  . CHF with left ventricular diastolic dysfunction, NYHA class 2 (HCC)    a. EF 55-60%, echo, April, 2013 b. EF 55-60%, mild biatrial enlargement and PASP 42 mmH  . Chronic atrial fibrillation (HCC)    Not felt to be an anticoagulation candidate secondary to hx of GIB/AVM  . Complication of anesthesia   . COPD (chronic obstructive pulmonary disease) (HCC)    on home oxygen  . GI bleed 2008   AVMs  . Hematoma    Right groin hematoma, small, post cath, April, 2014  . Hx of CABG    CABG 2005  . Hyperlipidemia   . Hypertension   . Hyponatremia    Chronic. Felt secondary to SIADH   . Intertrochanteric fracture of right hip (HCC)  12/16/2015  . Leg ulcer (HCC)    Ulcerated lesion on the anterior aspect of her left lower leg, June, 2014  . Mitral regurgitation    Mild, hospital, March, 2014  . Syncope    Nitroglycerin plus a diuretic, April, 2009  . Tobacco abuse   . Tuberculosis    Exposures to tuberculosis 1970s, has tested negative by the health Department  . Type 2 diabetes mellitus (HCC)   . Venous stasis of lower extremity    Chronic    Patient Active Problem List   Diagnosis Date Noted  . Fall from slip, trip, or stumble   . Postoperative anemia due to acute blood loss 12/18/2015  . Aneurysm of abdominal aorta (HCC)   . Pressure ulcer 12/17/2015  . Closed intertrochanteric fracture of hip (HCC) 12/16/2015  . DNR (do not resuscitate) 09/09/2015  . Altered mental status 05/01/2015  . AKI (acute kidney injury) (HCC)   . Coronary artery disease involving native coronary artery of native heart with angina pectoris (HCC)   . Chronic diastolic congestive heart failure (HCC)   . Insulin dependent type 2 diabetes mellitus (HCC)   . HCAP (healthcare-associated pneumonia) 03/18/2015  . Bile duct obstruction   . Elevated LFTs   . Elevated troponin 11/19/2014  . SOB (shortness of breath)   . Transaminitis 11/18/2014  . Pancreatic lesion 11/18/2014  .  Sepsis (HCC) 05/04/2014  . UTI (lower urinary tract infection) 05/04/2014  . Atrial fibrillation with RVR (HCC) 05/04/2014  . Atrial fibrillation with rapid ventricular response (HCC)   . Pyelonephritis   . Bronchitis, chronic obstructive w acute bronchitis (HCC) 02/02/2014  . COPD exacerbation (HCC) 02/01/2014  . Dyspnea 11/15/2013  . Atrial fibrillation with tachycardic ventricular rate (HCC) 11/15/2013  . Peripheral arterial disease (HCC) 10/17/2013  . Atypical chest pain 02/08/2013  . Cellulitis, leg 09/28/2012  . Leg ulcer (HCC)   . CAD (coronary artery disease)   . Hematoma   . Mitral regurgitation   . Chronic diastolic CHF (congestive heart  failure) (HCC) 07/20/2012  . Anemia 07/20/2012  . Intermediate coronary syndrome (HCC) 07/18/2012  . Acute exacerbation of chronic obstructive pulmonary disease (COPD) (HCC) 02/15/2012  . Dehydration 02/12/2012  . Alkalosis, metabolic 02/12/2012  . Generalized weakness 02/11/2012  . Atrial fibrillation (HCC)   . Chronic respiratory failure (HCC) 07/29/2011  . H/O steroid therapy 07/28/2011  . Edema   . Aortic valve sclerosis   . Hypertension   . Hyperlipidemia   . GI bleed   . Aneurysm, aortic (HCC)   . Syncope   . Hyponatremia   . COPD (chronic obstructive pulmonary disease) (HCC)   . Tobacco abuse   . Bradycardia   . Hx of CABG   . Ejection fraction   . Carotid artery disease (HCC)   . Tuberculosis   . OXYGEN-USE OF SUPPLEMENTAL 08/02/2009    Past Surgical History:  Procedure Laterality Date  . ABDOMINAL HYSTERECTOMY    . carotid endartercetomy    . CHOLECYSTECTOMY    . CORONARY ANGIOPLASTY WITH STENT PLACEMENT  07/18/12   severe native coronary artery disease, 99% proximal stenosis of the SVG-D2 status post successful PTCA/PCI with a Veriflex bare-metal stent, widely patent SVGs to both the right PDA sequential OM1 to OM 2, EF 65-70% and 3+ mitral regurgitation  . CORONARY ARTERY BYPASS GRAFT  2005  . ERCP N/A 11/20/2014   Procedure: ENDOSCOPIC RETROGRADE CHOLANGIOPANCREATOGRAPHY (ERCP);  Surgeon: Meryl Dare, MD;  Location[facility] ENDOSCOPY;  Service: Endoscopy;  Laterality: N/A;  . FEMUR IM NAIL Right 12/17/2015   Procedure: INTRAMEDULLARY (IM) NAIL FEMORAL;  Surgeon: Teryl Lucy, MD;  Location: [facility] OR;  Service: Orthopedics;  Laterality: Right;  . LEFT HEART CATHETERIZATION WITH CORONARY ANGIOGRAM N/A 07/18/2012   Procedure: LEFT HEART CATHETERIZATION WITH CORONARY ANGIOGRAM;  Surgeon: Marykay Lex, MD;  Location[facility] CATH LAB;  Service: Cardiovascular;  Laterality: N/A;  . PERCUTANEOUS CORONARY STENT INTERVENTION (PCI-S)  07/18/2012   Procedure: PERCUTANEOUS CORONARY  STENT INTERVENTION (PCI-S);  Surgeon: Marykay Lex, MD;  Location: [facility] CATH LAB;  Service: Cardiovascular;;    Current Outpatient Rx  . Order #: [id number] Class: No Print  . Order #: [id number] Class: Normal  . Order #: [id number] Class: Historical Med  . Order #: [id number] Class: Print  . Order #: [id number] Class: Normal  . Order #: [id number] Class: Historical Med  . Order #: [id number] Class: Print  . Order #: [id number] Class: Normal  . Order #: [id number] Class: Normal  . Order #: [id number] Class: Historical Med  . Order #: [id number] Class: Normal  . Order #: [id number] Class: Historical Med  . Order #: [id number] Class: Print  . Order #: [id number] Class: Historical Med  . Order #: [id number] Class: Historical Med  . Order #: [id number] Class: Historical Med  . Order #: [id number] Class: Print  . Order #: [id number] Class: Historical Med  . Order #: [id number] Class: Normal  . Order #: [id number] Class: Historical Med  .  Order #: [id number] Class: Normal  . Order #: [id number] Class: Historical Med  . Order #: [id number] Class: No Print  . Order #: [id number] Class: Print  . Order #: [id number] Class: Print  . Order #: [id number] Class: Normal  . Order #: [id number] Class: No Print  . Order #: [id number] Class: Print  . Order #: [id number] Class: No Print  . Order #: [id number] Class: Print  . Order #: [id number] Class: Normal    Allergies Other; Codeine; Sulfonamide derivatives; Warfarin; Penicillins; and Ranitidine hcl  Family History  Problem Relation Age of Onset  . Congestive Heart Failure Mother   . Lung cancer Father   . Stroke Sister   . Breast cancer Sister   . Breast cancer Brother   . Lung cancer Sister   . Prostate cancer Brother   . Heart attack Neg Hx   . Hypertension Neg Hx     Social History Social History  Substance Use Topics  . Smoking status: Former Smoker    Packs/day: 0.50    Years: 59.00    Types: Cigarettes    Quit date: 02/01/2013  . Smokeless tobacco: Never Used  . Alcohol use No      Review of Systems  Constitutional: No fever/chills Eyes: No visual changes. ENT: No sore throat. Cardiovascular: Denies chest pain. Respiratory: Positive shortness of breath. Gastrointestinal: No abdominal pain.  No nausea, no vomiting.  No diarrhea.  No constipation. Genitourinary: Negative for dysuria. Musculoskeletal: Negative for back pain. Skin: Negative for rash. Neurological: Negative for headaches, focal weakness or numbness.  10-point ROS otherwise negative.  ____________________________________________   PHYSICAL EXAM:  VITAL SIGNS: ED Triage Vitals  Enc Vitals Group     BP 02/24/16 1245 128/82     Pulse Rate 02/24/16 1245 105     Resp 02/24/16 1245 20     Pain Score 02/24/16 1246 0   Constitutional: Alert with mild confusion. Moderate tachypnea. Eyes: Conjunctivae are normal. Head: Atraumatic. Nose: No congestion/rhinnorhea. Mouth/Throat: Mucous membranes are dry. Oropharynx non-erythematous. Neck: No stridor.   Cardiovascular: Irregularly irregular tachycardia. Good peripheral circulation. Grossly normal heart sounds. Respiratory: Normal respiratory effort.  No retractions. Lungs are diminished at the bases bilaterally. Gastrointestinal: Soft and nontender. No distention.  Musculoskeletal: No lower extremity tenderness nor edema. No gross deformities of extremities. Neurologic:  Normal speech and language. No gross focal neurologic deficits are appreciated.  Skin:  Skin is warm, dry and intact. No rash noted.  ____________________________________________   LABS (all labs ordered are listed, but only abnormal results are displayed)  Labs Reviewed  COMPREHENSIVE METABOLIC PANEL - Abnormal; Notable for the following:       Result Value   Chloride 98 (*)    Glucose, Bld 112 (*)    Calcium 10.4 (*)    Total Protein 6.1 (*)    Albumin 2.8 (*)    AST 14 (*)    ALT 6 (*)    All other components within normal limits  CBC WITH DIFFERENTIAL/PLATELET -  Abnormal; Notable for the following:    Hemoglobin 11.4 (*)    Neutro Abs 8.3 (*)    Lymphs Abs 0.5 (*)    All other components within normal limits  DIGOXIN LEVEL - Abnormal; Notable for the following:    Digoxin Level 0.2 (*)    All other components within normal limits  D-DIMER, QUANTITATIVE (NOT AT [facility]) - Abnormal; Notable for the following:    D-Dimer, Quant 1.46 (*)    All other components within normal limits  BRAIN NATRIURETIC PEPTIDE  URINALYSIS, ROUTINE W REFLEX MICROSCOPIC (NOT AT [facility])  I-STAT TROPOININ, ED   ____________________________________________  EKG   EKG Interpretation  Date/Time:  Monday February 24 2016 12:49:49 EDT Ventricular Rate:  107 PR Interval:    QRS Duration: 90 QT Interval:  312 QTC Calculation: 417 R Axis:   -67 Text Interpretation:  Atrial fibrillation Left anterior fascicular block Probable LVH with secondary repol abnrm Anterior Q waves, possibly due to LVH No STEMI. Similar to prior.  Confirmed by LONG MD, JOSHUA [phone]([id number]) on 02/24/2016 1:26:27 PM       ____________________________________________  RADIOLOGY  Dg Chest 2 View  Result Date: 02/24/2016 CLINICAL DATA:  82 year old female with congestion shortness breath for the past week. Hypertension. Subsequent encounter. EXAM: CHEST  2 VIEW COMPARISON:  02/20/2016. FINDINGS: Asymmetric mild pulmonary edema superimposed upon chronic changes. Small pleural effusions have increased in size since prior exam. Post CABG.  Heart size within normal limits. Calcified mildly tortuous aorta. Thoracic kyphosis without focal compression fracture. Prominent nipple shadows. IMPRESSION: Asymmetric mild pulmonary edema superimposed upon chronic changes. Small pleural effusions have increased size since prior exam. Aortic atherosclerosis. Electronically Signed   By: Lacy DuverneySteven  Olson M.D.   On: 02/24/2016 13:42    ____________________________________________   PROCEDURES  Procedure(s) performed:    Procedures  None ____________________________________________   INITIAL IMPRESSION / ASSESSMENT AND PLAN / ED COURSE  Pertinent labs & imaging results that were available during my care of the patient were reviewed by me and considered in my medical decision making (see chart for details).  Patient resents to the emergency room for evaluation of dyspnea. Her history and review of systems are somewhat limited by mild dementia. Patient is on home oxygen of 2 L but here in the emergency department is requiring 4 L with some tachypnea. He had a hip surgery in August of this year. Exam notable for tachypnea, mild wheezing, diminished sounds at the bases. Differential is broad as patient has multiple cardiac vascular comorbidities, risk factors for pulmonary emboli, coronary artery disease, and baseline COPD. Plan for x-ray and labs. EKG shows A. fib with mild tachycardia.   03:41 PM Grand daughter now at bedside able to provide more history. She states that she was noticing drops and her oxygen at home without obvious provocation. She seemed to be breathing more and had a cough. There went to her primary care physician on Thursday where the patient was started on Levaquin. The granddaughter states no pneumonia was found on chest x-ray. He is been compliant with all of her home medications but not improving and so they presented here today. Labs are largely unremarkable with the exception of a slightly elevated d-dimer. Given her recent surgery I plan to obtain a CT scan of the chest to rule out pulmonary embolus and admit given increased tachypnea and O2 requirement.   Discussed patient's case with hospitalist, Dr. Teresa Coombshoi's team.  Recommend admission to obs, telemetry bed.  I will place holding orders per their request. Patient and family (if present) updated with plan. Care transferred to hospitalist service. Patient will receive CTA of the chest prior to leaving the ED. Discussed with nursing.   I  reviewed all nursing notes, vitals, pertinent old records, EKGs, labs, imaging (as available).  ____________________________________________  FINAL CLINICAL IMPRESSION(S) / ED DIAGNOSES  Final diagnoses:  Shortness of breath  Hypoxemia     MEDICATIONS GIVEN DURING THIS VISIT:  Medications  iopamidol (ISOVUE-370) 76 % injection (not administered)  diltiazem (CARDIZEM CD) 24  hr capsule 120 mg (120 mg Oral Given 02/24/16 1629)     NEW OUTPATIENT MEDICATIONS STARTED DURING THIS VISIT:  None   Note:  This document was prepared using Dragon voice recognition software and may include unintentional dictation errors.  Alona Bene, MD Emergency Medicine   Maia Plan, MD 02/24/16 [phone]

## 2016-02-24 NOTE — ED Notes (Signed)
MD at bedside. 

## 2016-02-24 NOTE — ED Triage Notes (Signed)
Per EMS: Pt with Hx of Dementia, Oxygen at home, c/o SOB for few days. Supposed to have an appointment with the doctor, but felt worse. IV was placed, noted LLL rhonchi.

## 2016-02-25 DIAGNOSIS — K838 Other specified diseases of biliary tract: Secondary | ICD-10-CM | POA: Diagnosis present

## 2016-02-25 DIAGNOSIS — F419 Anxiety disorder, unspecified: Secondary | ICD-10-CM | POA: Diagnosis present

## 2016-02-25 DIAGNOSIS — J189 Pneumonia, unspecified organism: Secondary | ICD-10-CM | POA: Diagnosis present

## 2016-02-25 DIAGNOSIS — Y95 Nosocomial condition: Secondary | ICD-10-CM | POA: Diagnosis present

## 2016-02-25 DIAGNOSIS — J441 Chronic obstructive pulmonary disease with (acute) exacerbation: Secondary | ICD-10-CM | POA: Diagnosis present

## 2016-02-25 DIAGNOSIS — Z9981 Dependence on supplemental oxygen: Secondary | ICD-10-CM | POA: Diagnosis not present

## 2016-02-25 DIAGNOSIS — R0602 Shortness of breath: Secondary | ICD-10-CM | POA: Diagnosis present

## 2016-02-25 DIAGNOSIS — E1165 Type 2 diabetes mellitus with hyperglycemia: Secondary | ICD-10-CM | POA: Diagnosis not present

## 2016-02-25 DIAGNOSIS — I110 Hypertensive heart disease with heart failure: Secondary | ICD-10-CM | POA: Diagnosis present

## 2016-02-25 DIAGNOSIS — Z66 Do not resuscitate: Secondary | ICD-10-CM | POA: Diagnosis present

## 2016-02-25 DIAGNOSIS — I2510 Atherosclerotic heart disease of native coronary artery without angina pectoris: Secondary | ICD-10-CM | POA: Diagnosis present

## 2016-02-25 DIAGNOSIS — E870 Hyperosmolality and hypernatremia: Secondary | ICD-10-CM | POA: Diagnosis present

## 2016-02-25 DIAGNOSIS — I480 Paroxysmal atrial fibrillation: Secondary | ICD-10-CM | POA: Diagnosis present

## 2016-02-25 DIAGNOSIS — L89152 Pressure ulcer of sacral region, stage 2: Secondary | ICD-10-CM | POA: Diagnosis present

## 2016-02-25 DIAGNOSIS — J440 Chronic obstructive pulmonary disease with acute lower respiratory infection: Secondary | ICD-10-CM | POA: Diagnosis present

## 2016-02-25 DIAGNOSIS — I5032 Chronic diastolic (congestive) heart failure: Secondary | ICD-10-CM | POA: Diagnosis present

## 2016-02-25 DIAGNOSIS — I482 Chronic atrial fibrillation: Secondary | ICD-10-CM | POA: Diagnosis present

## 2016-02-25 DIAGNOSIS — T380X5A Adverse effect of glucocorticoids and synthetic analogues, initial encounter: Secondary | ICD-10-CM | POA: Diagnosis not present

## 2016-02-25 DIAGNOSIS — J9621 Acute and chronic respiratory failure with hypoxia: Secondary | ICD-10-CM | POA: Diagnosis not present

## 2016-02-25 DIAGNOSIS — E785 Hyperlipidemia, unspecified: Secondary | ICD-10-CM | POA: Diagnosis present

## 2016-02-25 DIAGNOSIS — S72143D Displaced intertrochanteric fracture of unspecified femur, subsequent encounter for closed fracture with routine healing: Secondary | ICD-10-CM | POA: Diagnosis not present

## 2016-02-25 LAB — GLUCOSE, CAPILLARY
GLUCOSE-CAPILLARY: 131 mg/dL — AB (ref 65–99)
GLUCOSE-CAPILLARY: 247 mg/dL — AB (ref 65–99)
Glucose-Capillary: 251 mg/dL — ABNORMAL HIGH (ref 65–99)
Glucose-Capillary: 199 mg/dL — ABNORMAL HIGH (ref 65–99)

## 2016-02-25 LAB — CBC
HEMATOCRIT: 35.2 % — AB (ref 36.0–46.0)
HEMOGLOBIN: 11 g/dL — AB (ref 12.0–15.0)
MCH: 28.7 pg (ref 26.0–34.0)
MCHC: 31.3 g/dL (ref 30.0–36.0)
MCV: 91.9 fL (ref 78.0–100.0)
Platelets: 262 10*3/uL (ref 150–400)
RBC: 3.83 MIL/uL — AB (ref 3.87–5.11)
RDW: 14.8 % (ref 11.5–15.5)
WBC: 9.6 10*3/uL (ref 4.0–10.5)

## 2016-02-25 LAB — URINE MICROSCOPIC-ADD ON

## 2016-02-25 LAB — BASIC METABOLIC PANEL
ANION GAP: 8 (ref 5–15)
BUN: 13 mg/dL (ref 6–20)
CHLORIDE: 93 mmol/L — AB (ref 101–111)
CO2: 29 mmol/L (ref 22–32)
Calcium: 10.1 mg/dL (ref 8.9–10.3)
Creatinine, Ser: 0.68 mg/dL (ref 0.44–1.00)
GFR calc non Af Amer: >60 mL/min (ref >60)
Glucose, Bld: 116 mg/dL — ABNORMAL HIGH (ref 65–99)
Potassium: 4.3 mmol/L (ref 3.5–5.1)
Sodium: 130 mmol/L — ABNORMAL LOW (ref 135–145)

## 2016-02-25 LAB — URINALYSIS, ROUTINE W REFLEX MICROSCOPIC
GLUCOSE, UA: NEGATIVE mg/dL
Hgb urine dipstick: NEGATIVE
KETONES UR: NEGATIVE mg/dL
Leukocytes, UA: NEGATIVE
Nitrite: NEGATIVE
PH: 6 (ref 5.0–8.0)
PROTEIN: 100 mg/dL — AB
Specific Gravity, Urine: >1.046 — ABNORMAL HIGH (ref 1.005–1.030)

## 2016-02-25 MED ORDER — VANCOMYCIN HCL IN DEXTROSE 1-5 GM/200ML-% IV SOLN
1000.0000 mg | Freq: Once | INTRAVENOUS | Status: AC
  Administered 2016-02-25: 1000 mg via INTRAVENOUS
  Filled 2016-02-25: qty 200

## 2016-02-25 MED ORDER — PIPERACILLIN-TAZOBACTAM 3.375 G IVPB
3.3750 g | Freq: Three times a day (TID) | INTRAVENOUS | Status: DC
  Administered 2016-02-25 – 2016-02-27 (×6): 3.375 g via INTRAVENOUS
  Filled 2016-02-25 (×7): qty 50

## 2016-02-25 MED ORDER — VANCOMYCIN HCL IN DEXTROSE 750-5 MG/150ML-% IV SOLN
750.0000 mg | INTRAVENOUS | Status: DC
  Administered 2016-02-26: 750 mg via INTRAVENOUS
  Filled 2016-02-25 (×2): qty 150

## 2016-02-25 MED ORDER — ORAL CARE MOUTH RINSE
15.0000 mL | Freq: Two times a day (BID) | OROMUCOSAL | Status: DC
  Administered 2016-02-25 – 2016-02-28 (×7): 15 mL via OROMUCOSAL

## 2016-02-25 NOTE — Consult Note (Signed)
WOC Nurse wound consult note Reason for Consult: Consult requested for buttock and sacrum. Wound type: Pt has red patchy areas of partial thickness skin loss; affected areas include posterior waistline, sacrum, bilat buttocks, and outer upper hips and thighs.  Appearance is consistent with moisture associated skin damage and locations correspond with where a brief wound be located. Pressure Ulcer POA: These sites were present on admission and are NOT pressure injuries. Wound bed: multiple pink moist partial thickness wounds Drainage (amount, consistency, odor) small amt yellow drainage, no odor Dressing procedure/placement/frequency: Allow airflow to promote drying and healing.  Barrier cream and antifungal powder to protect and repel moisture. Please re-consult if further assistance is needed.  Thank-you,  Cammie Mcgeeawn Aurelio Mccamy MSN, RN, CWOCN, [place], CNS [phone]

## 2016-02-25 NOTE — Evaluation (Signed)
Clinical/Bedside Swallow Evaluation Patient Details  Name: [name] MRN: [id number] Date of Birth: 1930/03/03  Today's Date: 02/25/2016 Time: SLP Start Time (ACUTE ONLY): 0908 SLP Stop Time (ACUTE ONLY): 0928 SLP Time Calculation (min) (ACUTE ONLY): 20 min  Past Medical History:  Past Medical History:  Diagnosis Date  . AAA (abdominal aortic aneurysm) (HCC)   . Aneurysm, aortic (HCC)    thoracic aorta, stable at 4.1 cm, chest CT, May, 2012  . Bradycardia   . CAD (coronary artery disease)    a. s/p CABG x5 (2005) LIMA->LAD, rSVG->diag, seq r SVG -> 1st OM and distal LCX, rSVG->PDA b. s/p BMS-prox SVG-D2 (2014)  . Carotid artery disease (HCC)    Hx of endarterectomy. Doppler October, 2011, stable, 0-39% RIC A., 40-59% LICA  . CHF with left ventricular diastolic dysfunction, NYHA class 2 (HCC)    a. EF 55-60%, echo, April, 2013 b. EF 55-60%, mild biatrial enlargement and PASP 42 mmH  . Chronic atrial fibrillation (HCC)    Not felt to be an anticoagulation candidate secondary to hx of GIB/AVM  . Complication of anesthesia   . COPD (chronic obstructive pulmonary disease) (HCC)    on home oxygen  . GI bleed 2008   AVMs  . Hematoma    Right groin hematoma, small, post cath, April, 2014  . Hx of CABG    CABG 2005  . Hyperlipidemia   . Hypertension   . Hyponatremia    Chronic. Felt secondary to SIADH   . Intertrochanteric fracture of right hip (HCC) 12/16/2015  . Leg ulcer (HCC)    Ulcerated lesion on the anterior aspect of her left lower leg, June, 2014  . Mitral regurgitation    Mild, hospital, March, 2014  . Syncope    Nitroglycerin plus a diuretic, April, 2009  . Tobacco abuse   . Tuberculosis    Exposures to tuberculosis 1970s, has tested negative by the health Department  . Type 2 diabetes mellitus (HCC)   . Venous stasis of lower extremity    Chronic   Past Surgical History:  Past Surgical History:  Procedure Laterality Date  . ABDOMINAL HYSTERECTOMY    .  carotid endartercetomy    . CHOLECYSTECTOMY    . CORONARY ANGIOPLASTY WITH STENT PLACEMENT  07/18/12   severe native coronary artery disease, 99% proximal stenosis of the SVG-D2 status post successful PTCA/PCI with a Veriflex bare-metal stent, widely patent SVGs to both the right PDA sequential OM1 to OM 2, EF 65-70% and 3+ mitral regurgitation  . CORONARY ARTERY BYPASS GRAFT  2005  . ERCP N/A 11/20/2014   Procedure: ENDOSCOPIC RETROGRADE CHOLANGIOPANCREATOGRAPHY (ERCP);  Surgeon: Meryl DareMalcolm T Stark, MD;  [facility];  Service: Endoscopy;  Laterality: N/A;  . FEMUR IM NAIL Right 12/17/2015   Procedure: INTRAMEDULLARY (IM) NAIL FEMORAL;  Surgeon: Teryl LucyJoshua Landau, MD;  Location: [facility] OR;  Service: Orthopedics;  Laterality: Right;  . LEFT HEART CATHETERIZATION WITH CORONARY ANGIOGRAM N/A 07/18/2012   Procedure: LEFT HEART CATHETERIZATION WITH CORONARY ANGIOGRAM;  Surgeon: Marykay Lexavid W Harding, MD;  [facility];  Service: Cardiovascular;  Laterality: N/A;  . PERCUTANEOUS CORONARY STENT INTERVENTION (PCI-S)  07/18/2012   Procedure: PERCUTANEOUS CORONARY STENT INTERVENTION (PCI-S);  Surgeon: Marykay Lexavid W Harding, MD;  [facility];  Service: Cardiovascular;;   HPI:  Karen Speaktta S Reynoldsis a 82 y.o.femalewith medical history significant of history of CABG, hypertension, ascending and descending aortic aneurysm, oxygen dependent COPD with severe airway obstruction, paroxysmal atrial fibrillation, not felt  to be a candidate for Coumadin, carotid artery disease, type 2 diabetes who presents today with shortness of breath. She tells me that she has had shortness of breath for a long time, but worsening in the past few weeks. She was seen by primary care physician on Thursday and was started on Levaquin without any improvement. She denies any fevers, chills, chest pain. She does admit to a cough that is sometimes productive with yellow sputum and sometimes nonproductive. She states that she always has a  cough at baseline and is unable to tell me if this is any worse from baseline or not. She denies any nausea, vomiting, abdominal pain, diarrhea, dysuria, or worsening peripheral edema. She did have her right hip fracture which was repaired in August 2017. She was discharged to a skilled nursing facility afterward and subsequently went home with family. Chest x-ray reveals small pleural effusions have increased size since prior exam.   Assessment / Plan / Recommendation Clinical Impression  Pt appears at reduce risk for aspiration when consuming regular diet with thin liquids. ST provided skilled observation of regular breakfast with thin liquids. Pt consumed without s/s of dysphagia or aspiration. Pt appeared drowsy and benefited from verbal reminders to stay awake and eat. Skilled ST not indicated at this time and ST to sign off. Education provided to nursing.     Aspiration Risk  Mild aspiration risk    Diet Recommendation Regular;Thin liquid   Liquid Administration via: Spoon;Straw Medication Administration: Whole meds with liquid Supervision: Patient able to self feed;Intermittent supervision to cue for compensatory strategies (to maintain alertness) Postural Changes: Seated upright at 90 degrees    Other  Recommendations Oral Care Recommendations: Oral care BID   Follow up Recommendations None        Swallow Study   General Date of Onset: 02/24/16 HPI: Karen Wenz Reynoldsis a 82 y.o.femalewith medical history significant of history of CABG, hypertension, ascending and descending aortic aneurysm, oxygen dependent COPD with severe airway obstruction, paroxysmal atrial fibrillation, not felt to be a candidate for Coumadin, carotid artery disease, type 2 diabetes who presents today with shortness of breath. She tells me that she has had shortness of breath for a long time, but worsening in the past few weeks. She was seen by primary care physician on Thursday and was started on Levaquin  without any improvement. She denies any fevers, chills, chest pain. She does admit to a cough that is sometimes productive with yellow sputum and sometimes nonproductive. She states that she always has a cough at baseline and is unable to tell me if this is any worse from baseline or not. She denies any nausea, vomiting, abdominal pain, diarrhea, dysuria, or worsening peripheral edema. She did have her right hip fracture which was repaired in August 2017. She was discharged to a skilled nursing facility afterward and subsequently went home with family. Chest x-ray reveals small pleural effusions have increased size since prior exam. Type of Study: Bedside Swallow Evaluation Previous Swallow Assessment: N/A Diet Prior to this Study: Regular;Thin liquids Temperature Spikes Noted: No Respiratory Status: Nasal cannula (4liters) History of Recent Intubation: No Behavior/Cognition: Lethargic/Drowsy Oral Cavity Assessment: Within Functional Limits Oral Care Completed by SLP: No Oral Cavity - Dentition: Adequate natural dentition Vision: Functional for self-feeding Self-Feeding Abilities: Able to feed self Patient Positioning: Upright in bed Baseline Vocal Quality: Normal Volitional Cough: Strong Volitional Swallow: Able to elicit    Oral/Motor/Sensory Function Overall Oral Motor/Sensory Function: Within functional limits   Ice  Chips Ice chips: Not tested   Thin Liquid Thin Liquid: Within functional limits Presentation: Straw;Self Fed    Nectar Thick Nectar Thick Liquid: Not tested   Honey Thick Honey Thick Liquid: Not tested   Puree Puree: Within functional limits Presentation: Self Fed;Spoon   Solid   GO   Solid: Within functional limits Presentation: Self Fed;Spoon    Functional Assessment Tool Used: Skilled Clinical Judgement Functional Limitations: Swallowing Swallow Goal Status ([id number]): At least 1 percent but less than 20 percent impaired, limited or restricted Swallow Discharge  Status [phone]([id number]): At least 1 percent but less than 20 percent impaired, limited or restricted  [name] B. Dreama Saaverton, M.S., CCC-SLP Speech-Language Pathologist  Annaliz Aven 02/25/2016,9:28 AM

## 2016-02-25 NOTE — Progress Notes (Signed)
PROGRESS NOTE    [name]  [name]:[id number] DOB: Nov 11, 1929 DOA: 02/24/2016 PCP: Dois Davenport., MD     Brief Narrative:  [name] is a 82 y.o. 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 She tells me that she has had shortness of breath for a long time, but worsening in the past few weeks. She was seen by primary care physician on Thursday and was started on Levaquin without any improvement. She denies any fevers, chills, chest pain. She does admit to a cough that is sometimes productive with yellow sputum and sometimes nonproductive. She states that she always has a cough at baseline and is unable to tell me if this is any worse from baseline or not. She denies any nausea, vomiting, abdominal pain, diarrhea, dysuria, or worsening peripheral edema. She did have her right hip fracture which was repaired in August 2017. She was discharged to a skilled nursing facility afterward and subsequently went home with family.   Assessment & Plan:   Principal Problem:   Acute on chronic respiratory failure with hypoxemia (HCC) Active Problems:   Hx of CABG   Carotid artery disease (HCC)   Atrial fibrillation (HCC)   CAD (coronary artery disease)   COPD exacerbation (HCC)   Chronic diastolic congestive heart failure (HCC)   DM type 2 (diabetes mellitus, type 2) (HCC)   Pressure ulcer   Anxiety   Acute on chronic hypoxemic respiratory failure -Requires 2 L of oxygen at baseline, now on 4 L, wean as able to maintain sat 88-92% -Secondary to COPD exacerbation, HCAP. CTA negative for PE. Less likely acute CHF as patient does not appear fluid overloaded on exam.  Acute excerbation of COPD -Follows [facility]  -IV Steroids -Continue Dulera, Spiriva -Duonebs QID and albuterol q2h prn   HCAP (recent hospitalization and SNF in Aug 2017) -Will broaden abx to Vanco and zosyn given bilateral effusions (R>L), bilateral upper lobe consolidations (R>L)  -SLP for aspiration evaluation    Chronic diastolic heart failure  -Echo on 12/19/2015 with EF 55-60% -Only takes lasix at home as needed for edema, will hold as patient does not appear fluid overloaded   Pneumobilia -Seen on CTA chest and further evaluated with CT abd/pelvis -Most recent instrumentation in the area was 10/2014 with ERCP  -Spoke with general surgery over the phone, this is likely secondary to instrumentation. Patient currently without abdominal symptoms, unremarkable LFT.  -Spoke with GI, agree with pneumobilia due to instrumentation   CAD s/p CABG -Continue aspirin   Paroxysmal A Fib -CHADSVASc 7 -Not a good candidate for coumadin due to GI bleed in the past  -Continue digoxin, diltiazem, aspirin  -Tele   DM type 2, well controlled -Previously on insulin but discontinued in setting of good control and advanced age -SSI while in hospital   Anxiety. -Continue Xanax, Valium  Pressure ulcer stage 2 of low back, sacrum, POA  -Wound care -Consult wound RN  -Turn patient q2h   Recent right hip surgery after fall  -S/p intramedullary nail to right femoral on 12/17/2015    DVT prophylaxis: subq hep  Code Status: DNR Family Communication: spoke with granddaughter  Disposition Plan: pending further improvement and plan    Consultants:   None  Procedures:  None  Antimicrobials:  Anti-infectives    Start     Dose/Rate Route Frequency Ordered Stop   02/26/16 1000  vancomycin (VANCOCIN) IVPB 750 mg/150 ml premix     750 mg 150 mL/hr over 60 Minutes Intravenous Every 24 hours 02/25/16 0829     02/25/16 0900  piperacillin-tazobactam (ZOSYN) IVPB 3.375 g     3.375 g 12.5 mL/hr over 240 Minutes  Intravenous Every 8 hours 02/25/16 0829     02/25/16 0900  vancomycin (VANCOCIN) IVPB 1000 mg/200 mL premix     1,000 mg 200 mL/hr over 60 Minutes Intravenous  Once 02/25/16 0829 02/25/16 1045   02/24/16 1900  azithromycin (ZITHROMAX) 500 mg in dextrose 5 % 250 mL IVPB  Status:  Discontinued     500 mg 250 mL/hr over 60 Minutes Intravenous Every 24 hours 02/24/16 1839 02/25/16 0813       Subjective: Patient quite sleepy today, states she did not sleep overnight. No new complaints. Does not know if her breathing is better or worse. No chest pain, abdominal pain, nausea, vomiting.    Objective: Vitals:   02/25/16 0653 02/25/16 0811 02/25/16 1132 02/25/16 1145  BP: 131/72  (!) 118/57   Pulse: 84  78   Resp: (!) 22  16   Temp: 97.5 F (36.4 C)  98.2 F (36.8 C)   TempSrc: Oral  Oral   SpO2: 94% 100% 98% 95%  Weight: 57.9 kg (127 lb 9.6 oz)     Height:        Intake/Output Summary (Last 24 hours) at 02/25/16 1223 Last data filed at 02/25/16 1131  Gross per 24 hour  Intake              850 ml  Output              500 ml  Net              350 ml   Filed Weights   02/24/16 1831 02/25/16 0653  Weight: 57 kg (125 lb 11.2 oz) 57.9 kg (127 lb 9.6 oz)    Examination:  Constitutional: NAD, calm, comfortable Eyes: PERRL, lids and conjunctivae normal ENMT: Mucous membranes are dry. Posterior pharynx clear of any exudate or lesions.Normal dentition.  Neck: normal, supple, no masses, no thyromegaly Respiratory: Wheezing bilaterally improved from yesterday, diminished breath sounds in the bases, on nasal cannula, no accessory muscle use Cardiovascular: Irregular rhythm, regular rate. no murmurs / rubs / gallops. No extremity edema. 2+ pedal pulses.  Abdomen: no tenderness, no masses palpated. No hepatosplenomegaly. Bowel sounds positive.  Musculoskeletal: no clubbing / cyanosis. No joint deformity upper and lower extremities. Good ROM, no contractures. Normal muscle tone.  Skin: +  Stage II pressure ulcer in low back/sacrum  Neurologic: CN 2-12 grossly intact. Sensation intact, Strength 5/5 in all 4.  Psychiatric: Normal judgment and insight. Alert and oriented x 3.    Data Reviewed: I have personally reviewed following labs and imaging studies  CBC:  Recent Labs Lab 02/24/16 1412 02/25/16 0318  WBC 9.4 9.6  NEUTROABS 8.3*  --   HGB 11.4* 11.0*  HCT 36.5 35.2*  MCV 92.6 91.9  PLT 272 262   Basic Metabolic Panel:  Recent Labs Lab 02/24/16 1412 02/25/16 0318  NA 135 130*  K 4.8 4.3  CL 98* 93*  CO2 30 29  GLUCOSE 112* 116*  BUN 17 13  CREATININE 0.72 0.68  CALCIUM 10.4* 10.1   GFR: Estimated Creatinine Clearance: 47.8  mL/min (by C-G formula based on SCr of 0.68 mg/dL). Liver Function Tests:  Recent Labs Lab 02/24/16 1412  AST 14*  ALT 6*  ALKPHOS 69  BILITOT 0.6  PROT 6.1*  ALBUMIN 2.8*   No results for input(s): LIPASE, AMYLASE in the last 168 hours. No results for input(s): AMMONIA in the last 168 hours. Coagulation Profile: No results for input(s): INR, PROTIME in the last 168 hours. Cardiac Enzymes: No results for input(s): CKTOTAL, CKMB, CKMBINDEX, TROPONINI in the last 168 hours. BNP (last 3 results) No results for input(s): PROBNP in the last 8760 hours. HbA1C: No results for input(s): HGBA1C in the last 72 hours. CBG:  Recent Labs Lab 02/24/16 1847 02/24/16 2209 02/25/16 0627 02/25/16 1130  GLUCAP 122* 151* 131* 251*   Lipid Profile: No results for input(s): CHOL, HDL, LDLCALC, TRIG, CHOLHDL, LDLDIRECT in the last 72 hours. Thyroid Function Tests: No results for input(s): TSH, T4TOTAL, FREET4, T3FREE, THYROIDAB in the last 72 hours. Anemia Panel: No results for input(s): VITAMINB12, FOLATE, FERRITIN, TIBC, IRON, RETICCTPCT in the last 72 hours. Sepsis Labs: No results for input(s): PROCALCITON, LATICACIDVEN in the last 168 hours.  No results found for this or any previous visit (from the past 240 hour(s)).      Radiology Studies: Dg Chest 2 View  Result Date: 02/24/2016 CLINICAL DATA:  82 year old female with congestion shortness breath for the past week. Hypertension. Subsequent encounter. EXAM: CHEST  2 VIEW COMPARISON:  02/20/2016. FINDINGS: Asymmetric mild pulmonary edema superimposed upon chronic changes. Small pleural effusions have increased in size since prior exam. Post CABG.  Heart size within normal limits. Calcified mildly tortuous aorta. Thoracic kyphosis without focal compression fracture. Prominent nipple shadows. IMPRESSION: Asymmetric mild pulmonary edema superimposed upon chronic changes. Small pleural effusions have increased size since prior exam. Aortic atherosclerosis. Electronically Signed   By: Lacy Duverney M.D.   On: 02/24/2016 13:42   Ct Angio Chest Pe W And/or Wo Contrast  Result Date: 02/24/2016 CLINICAL DATA:  Shortness of breath, hard to breathe lot of congestion EXAM: CT ANGIOGRAPHY CHEST WITH CONTRAST TECHNIQUE: Multidetector CT imaging of the chest was performed using the standard protocol during bolus administration of intravenous contrast. Multiplanar CT image reconstructions and MIPs were obtained to evaluate the vascular anatomy. CONTRAST:  100 cc of Isovue 370 intravenous COMPARISON:  None. FINDINGS: Cardiovascular: There is no evidence for filling defect within the central or segmental pulmonary arteries to suggest the presence of an acute embolus. There is ectasia of the ascending segment of the aorta, measuring up to 4 cm. There are postsurgical changes of the mediastinum. Coronary artery calcifications. No large pericardial effusion. Mediastinum/Nodes: Mildly prominent precarinal lymph nodes. Measuring up to 1 cm. Trachea and mainstem bronchi grossly unremarkable. Esophagus is unremarkable. Imaged thyroid within normal limits. Carotid artery calcifications. Lungs/Pleura: There are moderate bilateral right greater than left pleural effusions. There is hazy  atelectasis or infiltrate in the right lower lobe. Irregular areas of consolidation are present within the bilateral right greater than left upper lobes. There is bronchiectasis within the right upper lobe. Bilateral emphysematous disease. Upper Abdomen: Upper abdominal images demonstrate atherosclerosis. Incompletely visualized liver demonstrates air density branching structures in the left hepatic lobe. Incompletely visualized. Musculoskeletal: Kyphosis of the spine with minimal wedging of mid to lower thoracic vertebra. Multilevel vacuum discs and degenerative changes. Review of the MIP images confirms the above findings. IMPRESSION: 1. No CT evidence for acute pulmonary embolus. 2. Stable ectasia of the ascending segment of the  aorta up to 4 cm with atherosclerosis. 3. Moderate bilateral right greater than left pleural effusions. Hazy atelectasis or infiltrate right lower lobe with irregular areas of bilateral right greater than left upper lobe consolidations. Bronchiectasis in the right upper lobe. Bilateral emphysematous disease. 4. Partially visualized liver demonstrates linear branching air density structures in the left hepatic lobe, probably representing pneumobilia. Correlation with surgical history is necessary, otherwise further evaluation with dedicated abdominal pelvic CT may be obtained. Electronically Signed   By: Jasmine PangKim  Fujinaga M.D.   On: 02/24/2016 17:55   Ct Abdomen Pelvis W Contrast  Result Date: 02/25/2016 CLINICAL DATA:  82 y/o  F; pneumobilia and abdominal pain. EXAM: CT ABDOMEN AND PELVIS WITH CONTRAST TECHNIQUE: Multidetector CT imaging of the abdomen and pelvis was performed using the standard protocol following bolus administration of intravenous contrast. CONTRAST:  100mL ISOVUE-300 IOPAMIDOL (ISOVUE-300) INJECTION 61% COMPARISON:  11/18/2014 CT abdomen and pelvis. Pelvic radiograph 12/17/2015. FINDINGS: Lower chest: Moderate right greater than left bilateral pleural effusions.  Coronary artery calcifications. Hepatobiliary: Pneumobilia within the left lobe of the liver. Cholecystectomy. Stable lucency within segment IVa near the IVC, probably a cyst. No significant intra or extrahepatic biliary ductal dilatation. Pancreas: Unremarkable. No pancreatic ductal dilatation or surrounding inflammatory changes. Spleen: Normal in size without focal abnormality. Adrenals/Urinary Tract: Normal adrenal glands. No obstructive uropathy or urinary stone disease. Several small foci of cortical thinning compatible with scars. Otherwise no focal renal abnormality. Bladder is unremarkable. Stomach/Bowel: Stomach is within normal limits. Appendix appears normal. No evidence of bowel wall thickening, distention, or inflammatory changes. Extensive sigmoid diverticulosis without evidence for diverticulitis. Small focus of enteroenteric intussusception of proximal duodenum of unlikely clinical significance. Vascular/Lymphatic: Extensive aortic atherosclerosis with concentric calcifications extending into the bilateral iliofemoral arteries. There is near occlusion of the left common iliac artery. Infrarenal abdominal aortic aneurysm measuring up to 3.0 cm. No enlarged abdominal or pelvic lymph nodes. Reproductive: Status post hysterectomy. No adnexal masses. Other: No abdominal wall hernia or abnormality. No abdominopelvic ascites. Musculoskeletal: Left total hip prosthesis and right femoral female and proximal compression screw with a comminuted intertrochanteric fracture similar in comparison with prior pelvic radiographs given differences in technique. IMPRESSION: 1. Pneumobilia within the left lobe of the liver imaging common bile duct without intra or extrahepatic biliary ductal dilatation, possibly related to sphincter of Oddi dysfunction. Correlation with liver enzymes is recommended. 2. Sigmoid diverticulosis without evidence of diverticulitis. 3. Extensive aortic atherosclerosis with near occlusion of left  common iliac artery. 4. Infrarenal abdominal aorta measuring up to 3 cm. Recommend followup by ultrasound in 3 years. This recommendation follows ACR consensus guidelines: White Paper of the ACR Incidental Findings Committee II on Vascular Findings. J Am Coll Radiol 2013; 10:789-794. 5. Comminuted right proximal femur intertrochanteric fracture similar in comparison with prior pelvic radiographs given differences in technique Electronically Signed   By: Mitzi HansenLance  Furusawa-Stratton M.D.   On: 02/25/2016 02:07      Scheduled Meds: . aspirin EC  81 mg Oral Daily  . digoxin  0.0625 mg Oral Daily  . diltiazem  120 mg Oral Daily  . feeding supplement (PRO-STAT SUGAR FREE 64)  30 mL Oral Daily  . heparin  5,000 Units Subcutaneous Q8H  . insulin aspart  0-9 Units Subcutaneous TID WC  . ipratropium-albuterol  3 mL Nebulization QID  . mouth rinse  15 mL Mouth Rinse BID  . methylPREDNISolone (SOLU-MEDROL) injection  80 mg Intravenous Q6H  . mometasone-formoterol  2 puff Inhalation BID  .  pantoprazole  40 mg Oral Daily  . piperacillin-tazobactam (ZOSYN)  IV  3.375 g Intravenous Q8H  . sodium chloride flush  3 mL Intravenous Q12H  . sodium chloride flush  3 mL Intravenous Q12H  . tiotropium  18 mcg Inhalation Daily  . [START ON 02/26/2016] vancomycin  750 mg Intravenous Q24H   Continuous Infusions:    LOS: 0 days    Time spent: 30 minutes   Noralee Stain, DO [facility] [email or web address] Password [email or web address] 02/25/2016, 12:23 PM

## 2016-02-25 NOTE — Progress Notes (Signed)
Pharmacy Antibiotic Note  [name] is a 82 y.o. female admitted on 02/24/2016 with pneumonia.  Pharmacy has been consulted for Zosyn and vancomycin dosing.  Day #1 of abx for HCAP. Does have allergy to PCNs but it is nasal itching. Will be ok to trial Zosyn. Afebrile, WBC wnl.   Plan: Start Zosyn 3.375 gm IV q8h (4 hour infusion) Give vancomycin 1g IV x 1, then start vancomycin  750mg  IV Q24 Monitor clinical picture, renal function, VT prn F/U C&S, abx deescalation / LOT   Height: 6' (182.9 cm) Weight: 127 lb 9.6 oz (57.9 kg) (bedscale) IBW/kg (Calculated) : 73.1  Temp (24hrs), Avg:98 F (36.7 C), Min:97.5 F (36.4 C), Max:98.4 F (36.9 C)   Recent Labs Lab 02/24/16 1412 02/25/16 0318  WBC 9.4 9.6  CREATININE 0.72 0.68    Estimated Creatinine Clearance: 47.8 mL/min (by C-G formula based on SCr of 0.68 mg/dL).    Allergies  Allergen Reactions  . Other Other (See Comments)    TETANUS-can only take 1/2 dose at one time, can take the other 1/2 dose about 3 days later  . Codeine Nausea And Vomiting  . Sulfonamide Derivatives Nausea And Vomiting  . Warfarin     H/o AVM's prechief complaintcluding anticoagulation  . Penicillins Itching, Rash and Other (See Comments)    Nasal itching   . Ranitidine Hcl Itching and Rash    Antimicrobials this admission: 10/30 Azithromycin x 1 10/31 Zosyn >> 10/31 Vancomycin >>   Dose adjustments this admission: n/a  Microbiology results: n/a  Thank you for allowing pharmacy to be a part of this patient's care.  Enzo BiNathan Jie Stickels, PharmD, BCPS Clinical Pharmacist Pager [phone] 02/25/2016 8:31 AM

## 2016-02-25 NOTE — Progress Notes (Signed)
Pt had SOB after using the bedpan, also c/o headache (per pt " I have migraine") , VS taken, breathing treatment given by the RT. Will continue to monitor pt

## 2016-02-26 DIAGNOSIS — J9621 Acute and chronic respiratory failure with hypoxia: Secondary | ICD-10-CM

## 2016-02-26 LAB — GLUCOSE, CAPILLARY
GLUCOSE-CAPILLARY: 198 mg/dL — AB (ref 65–99)
GLUCOSE-CAPILLARY: 231 mg/dL — AB (ref 65–99)
GLUCOSE-CAPILLARY: 263 mg/dL — AB (ref 65–99)
Glucose-Capillary: 213 mg/dL — ABNORMAL HIGH (ref 65–99)

## 2016-02-26 LAB — CBC
HEMATOCRIT: 32.7 % — AB (ref 36.0–46.0)
HEMOGLOBIN: 10 g/dL — AB (ref 12.0–15.0)
MCH: 28.2 pg (ref 26.0–34.0)
MCHC: 30.6 g/dL (ref 30.0–36.0)
MCV: 92.4 fL (ref 78.0–100.0)
Platelets: 241 10*3/uL (ref 150–400)
RBC: 3.54 MIL/uL — AB (ref 3.87–5.11)
RDW: 14.7 % (ref 11.5–15.5)
WBC: 7.2 10*3/uL (ref 4.0–10.5)

## 2016-02-26 LAB — BASIC METABOLIC PANEL
Anion gap: 8 (ref 5–15)
BUN: 26 mg/dL — AB (ref 6–20)
CHLORIDE: 93 mmol/L — AB (ref 101–111)
CO2: 30 mmol/L (ref 22–32)
Calcium: 9.9 mg/dL (ref 8.9–10.3)
Creatinine, Ser: 1.14 mg/dL — ABNORMAL HIGH (ref 0.44–1.00)
GFR calc Af Amer: 50 mL/min — ABNORMAL LOW (ref >60)
GFR calc non Af Amer: 43 mL/min — ABNORMAL LOW (ref >60)
GLUCOSE: 230 mg/dL — AB (ref 65–99)
POTASSIUM: 5.2 mmol/L — AB (ref 3.5–5.1)
Sodium: 131 mmol/L — ABNORMAL LOW (ref 135–145)

## 2016-02-26 NOTE — Progress Notes (Signed)
Inpatient Diabetes Program Recommendations  AACE/ADA: New Consensus Statement on Inpatient Glycemic Control (2015)  Target Ranges:  Prepandial:   less than 140 mg/dL      Peak postprandial:   less than 180 mg/dL (1-2 hours)      Critically ill patients:  140 - 180 mg/dL   Results for [name], [name] (MRN [id number]) as of 02/26/2016 10:57  Ref. Range 02/25/2016 06:27 02/25/2016 11:30 02/25/2016 16:33 02/25/2016 23:11 02/26/2016 05:45  Glucose-Capillary Latest Ref Range: 65 - 99 mg/dL 409131 (H) 811251 (H) 914199 (H) 247 (H) 198 (H)  Results for [name], [name] (MRN [id number]) as of 02/26/2016 10:57  Ref. Range 12/16/2015 18:54  Hemoglobin A1C Latest Ref Range: 4.8 - 5.6 % 5.0   Review of Glycemic Control  Diabetes history: DM2, well controlled, steroids ordered, normal kidney function Outpatient Diabetes medications: Humalog 2-10 units SSI Current orders for Inpatient glycemic control: Nov[facility]  Inpatient Diabetes Program Recommendations: Please consider Novolog 2 units meal coverage if patient eats >50% TIDAC and 0-5 units QHS correction to cover high postprandial CBG'[name] while on steroids.  Thank you,  Kristine LineaKaren Mouhamadou Gittleman, RN, BSN Diabetes Coordinator Inpatient Diabetes Program [phone] (Team Pager)

## 2016-02-26 NOTE — Progress Notes (Signed)
Patient's capillary blood glucose was 263 and has no night time coverage. Physician notified. Physician will get an order for in the morning.

## 2016-02-26 NOTE — Progress Notes (Signed)
PROGRESS NOTE    Karen Lynntta S Scardino  ZHY:[id number] DOB: Mar 10, 1930 DOA: 02/24/2016 PCP: Dois DavenportICHTER,KAREN L., MD     Brief Narrative:  [name] is a 82 y.o. female with medical history significant of history of CABG, HTN, ascending and descending aortic aneurysm, oxygen-dependent COPD , PAF not a candidate for anticoagulation, carotid artery disease,and T2DM presenting with shortness of breath. She was seen by her PCP for cough and started on levaquin without improvement PTA. on Thursday and was started on Levaquin without any improvement. She denies any fevers, chills, chest pain. She did have her right hip fracture which was repaired in August 2017. She was discharged to a skilled nursing facility afterward and subsequently went home with family. DDimer was elevated but CTA was negative for PE. With IV steroids and antiviotics for COPD exacerbation due to HCAP, breathing has improved. She has returned to 2L O2 by nasal cannula.   Assessment & Plan:   Principal Problem:   Acute on chronic respiratory failure with hypoxemia (HCC) Active Problems:   Hx of CABG   Carotid artery disease (HCC)   Atrial fibrillation (HCC)   CAD (coronary artery disease)   COPD exacerbation (HCC)   HCAP (healthcare-associated pneumonia)   Chronic diastolic congestive heart failure (HCC)   DM type 2 (diabetes mellitus, type 2) (HCC)   Pressure ulcer   Anxiety   Pneumobilia  Acute on chronic hypoxemic respiratory failure: Secondary to COPD exacerbation, HCAP. CTA negative for PE. Less likely acute CHF as patient does not appear fluid overloaded on exam. - Requires 2 L of oxygen at baseline, 4L at admission, now back to baseline. Wean as able to maintain sat 88-92%  Acute excerbation of COPD: Follows [facility]  - IV Steroids - Continue Dulera, Spiriva - Duonebs QID and albuterol q2h prn   HCAP (recent hospitalization and SNF in Aug 2017):  - continue vanc/zosyn given bilateral effusions (R>L),  bilateral upper lobe consolidations (R>L). Consider transition to po abx 11/2 - Follow cultures - SLP for aspiration evaluation    Chronic diastolic heart failure  -Echo on 12/19/2015 with EF 55-60% - Only takes lasix at home as needed for edema, will hold as patient does not appear fluid overloaded   Pneumobilia -Seen on CTA chest and further evaluated with CT abd/pelvis -Most recent instrumentation in the area was 10/2014 with ERCP  -Spoke with general surgery over the phone, this is likely secondary to instrumentation. Patient currently without abdominal symptoms, unremarkable LFT.  -Spoke with GI, agree with pneumobilia due to instrumentation   CAD s/p CABG -Continue aspirin   Paroxysmal A Fib: CHADSVASc 7 - Not a good candidate for coumadin due to GI bleed in the past  - Continue digoxin, diltiazem, aspirin  - Telemetry monitoring   DM type 2, well controlled: Previously on insulin but discontinued in setting of good control and advanced age -SSI while in hospital, hyperglycemia due to steroids.    Anxiety. -Continue xanax and valium to avoid withdrawal. STRONGLY consider discontinuing one of these 2 benzo's in this 82yo female with chronic respiratory failure.  Pressure ulcer stage 2 of low back, sacrum, POA  - Wound care per [facility]OC RN - Turn patient q2h   Recent right hip surgery after fall: S/p intramedullary nail to right femoral on 12/17/2015  - Fall precautions  DVT prophylaxis: subq hep  Code Status: DNR Family Communication: spoke with granddaughter  Disposition Plan: pending further improvement, D/C in 24-48 hrs.  Consultants:   None  Procedures:   None  Antimicrobials:  Anti-infectives    Start     Dose/Rate Route Frequency Ordered Stop   02/26/16 1000  vancomycin (VANCOCIN) IVPB 750 mg/150 ml premix     750 mg 150 mL/hr over 60 Minutes Intravenous Every 24 hours 02/25/16 0829     02/25/16 0900  piperacillin-tazobactam (ZOSYN) IVPB 3.375 g      3.375 g 12.5 mL/hr over 240 Minutes Intravenous Every 8 hours 02/25/16 0829     02/25/16 0900  vancomycin (VANCOCIN) IVPB 1000 mg/200 mL premix     1,000 mg 200 mL/hr over 60 Minutes Intravenous  Once 02/25/16 0829 02/25/16 1045   02/24/16 1900  azithromycin (ZITHROMAX) 500 mg in dextrose 5 % 250 mL IVPB  Status:  Discontinued     500 mg 250 mL/hr over 60 Minutes Intravenous Every 24 hours 02/24/16 1839 02/25/16 0813       Subjective: Patient has no new complaints. Says breathing is a bit better than arrival. No chest pain, fever, cough or uncontrolled pain/anxiety.  Objective: Vitals:   02/26/16 1027 02/26/16 1150 02/26/16 1158 02/26/16 1552  BP:  137/63    Pulse:  77    Resp:  18    Temp:  97.7 F (36.5 C)    TempSrc:  Oral    SpO2: 96% 96% 97% 96%  Weight:      Height:        Intake/Output Summary (Last 24 hours) at 02/26/16 1904 Last data filed at 02/26/16 1437  Gross per 24 hour  Intake              550 ml  Output                0 ml  Net              550 ml   Filed Weights   02/24/16 1831 02/25/16 0653 02/26/16 0552  Weight: 57 kg (125 lb 11.2 oz) 57.9 kg (127 lb 9.6 oz) 58.3 kg (128 lb 8.3 oz)    Examination:  Constitutional: NAD, calm, comfortable Eyes: PERRL, lids and conjunctivae normal ENMT: Mucous membranes are dry. Posterior pharynx clear of any exudate or lesions.Normal dentition.  Neck: normal, supple, no masses, no thyromegaly Respiratory: Nonlabored on 4L O2 this AM with globally diminished breath sounds, most in the bases. No crackles. + scattered wheezes. Cardiovascular: Irregular rhythm, regular rate. no murmurs / rubs / gallops. No extremity edema. 2+ pedal pulses.  Abdomen: no tenderness, no masses palpated. No hepatosplenomegaly. Bowel sounds positive.  Musculoskeletal: no clubbing / cyanosis. No joint deformity upper and lower extremities. Good ROM, no contractures. Normal muscle tone.  Skin: + Stage II pressure ulcer in low back/sacrum  covered dressing c/d/i Neurologic: CN 2-12 grossly intact. Sensation intact, Strength 5/5 in all 4.  Psychiatric: Normal judgment and insight. Alert and oriented x 3.    Data Reviewed: I have personally reviewed following labs and imaging studies  CBC:  Recent Labs Lab 02/24/16 1412 02/25/16 0318 02/26/16 0306  WBC 9.4 9.6 7.2  NEUTROABS 8.3*  --   --   HGB 11.4* 11.0* 10.0*  HCT 36.5 35.2* 32.7*  MCV 92.6 91.9 92.4  PLT 272 262 241   Basic Metabolic Panel:  Recent Labs Lab 02/24/16 1412 02/25/16 0318 02/26/16 0306  NA 135 130* 131*  K 4.8 4.3 5.2*  CL 98* 93* 93*  CO2 30 29 30   GLUCOSE 112* 116* 230*  BUN 17 13 26*  CREATININE 0.72 0.68 1.14*  CALCIUM 10.4* 10.1 9.9   GFR: Estimated Creatinine Clearance: 33.8 mL/min (by C-G formula based on SCr of 1.14 mg/dL (H)). Liver Function Tests:  Recent Labs Lab 02/24/16 1412  AST 14*  ALT 6*  ALKPHOS 69  BILITOT 0.6  PROT 6.1*  ALBUMIN 2.8*   No results for input(s): LIPASE, AMYLASE in the last 168 hours. No results for input(s): AMMONIA in the last 168 hours. Coagulation Profile: No results for input(s): INR, PROTIME in the last 168 hours. Cardiac Enzymes: No results for input(s): CKTOTAL, CKMB, CKMBINDEX, TROPONINI in the last 168 hours. BNP (last 3 results) No results for input(s): PROBNP in the last 8760 hours. HbA1C: No results for input(s): HGBA1C in the last 72 hours. CBG:  Recent Labs Lab 02/25/16 1633 02/25/16 2311 02/26/16 0545 02/26/16 1148 02/26/16 1734  GLUCAP 199* 247* 198* 231* 213*   Lipid Profile: No results for input(s): CHOL, HDL, LDLCALC, TRIG, CHOLHDL, LDLDIRECT in the last 72 hours. Thyroid Function Tests: No results for input(s): TSH, T4TOTAL, FREET4, T3FREE, THYROIDAB in the last 72 hours. Anemia Panel: No results for input(s): VITAMINB12, FOLATE, FERRITIN, TIBC, IRON, RETICCTPCT in the last 72 hours. Sepsis Labs: No results for input(s): PROCALCITON, LATICACIDVEN in the  last 168 hours.  No results found for this or any previous visit (from the past 240 hour(s)).     Radiology Studies: Ct Abdomen Pelvis W Contrast  Result Date: 02/25/2016 CLINICAL DATA:  82 y/o  F; pneumobilia and abdominal pain. EXAM: CT ABDOMEN AND PELVIS WITH CONTRAST TECHNIQUE: Multidetector CT imaging of the abdomen and pelvis was performed using the standard protocol following bolus administration of intravenous contrast. CONTRAST:  100mL ISOVUE-300 IOPAMIDOL (ISOVUE-300) INJECTION 61% COMPARISON:  11/18/2014 CT abdomen and pelvis. Pelvic radiograph 12/17/2015. FINDINGS: Lower chest: Moderate right greater than left bilateral pleural effusions. Coronary artery calcifications. Hepatobiliary: Pneumobilia within the left lobe of the liver. Cholecystectomy. Stable lucency within segment IVa near the IVC, probably a cyst. No significant intra or extrahepatic biliary ductal dilatation. Pancreas: Unremarkable. No pancreatic ductal dilatation or surrounding inflammatory changes. Spleen: Normal in size without focal abnormality. Adrenals/Urinary Tract: Normal adrenal glands. No obstructive uropathy or urinary stone disease. Several small foci of cortical thinning compatible with scars. Otherwise no focal renal abnormality. Bladder is unremarkable. Stomach/Bowel: Stomach is within normal limits. Appendix appears normal. No evidence of bowel wall thickening, distention, or inflammatory changes. Extensive sigmoid diverticulosis without evidence for diverticulitis. Small focus of enteroenteric intussusception of proximal duodenum of unlikely clinical significance. Vascular/Lymphatic: Extensive aortic atherosclerosis with concentric calcifications extending into the bilateral iliofemoral arteries. There is near occlusion of the left common iliac artery. Infrarenal abdominal aortic aneurysm measuring up to 3.0 cm. No enlarged abdominal or pelvic lymph nodes. Reproductive: Status post hysterectomy. No adnexal  masses. Other: No abdominal wall hernia or abnormality. No abdominopelvic ascites. Musculoskeletal: Left total hip prosthesis and right femoral female and proximal compression screw with a comminuted intertrochanteric fracture similar in comparison with prior pelvic radiographs given differences in technique. IMPRESSION: 1. Pneumobilia within the left lobe of the liver imaging common bile duct without intra or extrahepatic biliary ductal dilatation, possibly related to sphincter of Oddi dysfunction. Correlation with liver enzymes is recommended. 2. Sigmoid diverticulosis without evidence of diverticulitis. 3. Extensive aortic atherosclerosis with near occlusion of left common iliac artery. 4. Infrarenal abdominal aorta measuring up to 3 cm. Recommend followup by ultrasound in 3 years. This recommendation follows ACR consensus guidelines: White Paper of the ACR Incidental  Findings Committee II on Vascular Findings. J Am Coll Radiol 2013; 10:789-794. 5. Comminuted right proximal femur intertrochanteric fracture similar in comparison with prior pelvic radiographs given differences in technique Electronically Signed   By: Mitzi Hansen M.D.   On: 02/25/2016 02:07      Scheduled Meds: . aspirin EC  81 mg Oral Daily  . digoxin  0.0625 mg Oral Daily  . diltiazem  120 mg Oral Daily  . feeding supplement (PRO-STAT SUGAR FREE 64)  30 mL Oral Daily  . heparin  5,000 Units Subcutaneous Q8H  . insulin aspart  0-9 Units Subcutaneous TID WC  . ipratropium-albuterol  3 mL Nebulization QID  . mouth rinse  15 mL Mouth Rinse BID  . methylPREDNISolone (SOLU-MEDROL) injection  80 mg Intravenous Q6H  . mometasone-formoterol  2 puff Inhalation BID  . pantoprazole  40 mg Oral Daily  . piperacillin-tazobactam (ZOSYN)  IV  3.375 g Intravenous Q8H  . sodium chloride flush  3 mL Intravenous Q12H  . sodium chloride flush  3 mL Intravenous Q12H  . tiotropium  18 mcg Inhalation Daily  . vancomycin  750 mg Intravenous  Q24H   Continuous Infusions:    LOS: 1 day   Time spent: 25 minutes   Hazeline Junker, MD [facility] Pager [phone]  [email or web address] Password [email or web address] 02/26/2016, 7:04 PM

## 2016-02-27 LAB — GLUCOSE, CAPILLARY
GLUCOSE-CAPILLARY: 235 mg/dL — AB (ref 65–99)
Glucose-Capillary: 201 mg/dL — ABNORMAL HIGH (ref 65–99)
Glucose-Capillary: 129 mg/dL — ABNORMAL HIGH (ref 65–99)
Glucose-Capillary: 183 mg/dL — ABNORMAL HIGH (ref 65–99)

## 2016-02-27 LAB — BASIC METABOLIC PANEL
ANION GAP: 6 (ref 5–15)
BUN: 31 mg/dL — ABNORMAL HIGH (ref 6–20)
CHLORIDE: 95 mmol/L — AB (ref 101–111)
CO2: 30 mmol/L (ref 22–32)
Calcium: 9.7 mg/dL (ref 8.9–10.3)
Creatinine, Ser: 1.05 mg/dL — ABNORMAL HIGH (ref 0.44–1.00)
GFR calc Af Amer: 55 mL/min — ABNORMAL LOW (ref >60)
GFR calc non Af Amer: 47 mL/min — ABNORMAL LOW (ref >60)
GLUCOSE: 204 mg/dL — AB (ref 65–99)
POTASSIUM: 4.9 mmol/L (ref 3.5–5.1)
Sodium: 131 mmol/L — ABNORMAL LOW (ref 135–145)

## 2016-02-27 MED ORDER — INSULIN ASPART 100 UNIT/ML ~~LOC~~ SOLN
0.0000 [IU] | Freq: Three times a day (TID) | SUBCUTANEOUS | Status: DC
  Administered 2016-02-27: 3 [IU] via SUBCUTANEOUS
  Administered 2016-02-28: 2 [IU] via SUBCUTANEOUS
  Administered 2016-02-28: 1 [IU] via SUBCUTANEOUS

## 2016-02-27 MED ORDER — PREDNISONE 20 MG PO TABS
20.0000 mg | ORAL_TABLET | Freq: Every day | ORAL | Status: DC
  Administered 2016-02-28: 20 mg via ORAL
  Filled 2016-02-27: qty 1

## 2016-02-27 MED ORDER — LEVOFLOXACIN 750 MG PO TABS
750.0000 mg | ORAL_TABLET | ORAL | Status: DC
  Administered 2016-02-27: 750 mg via ORAL
  Filled 2016-02-27: qty 1

## 2016-02-27 MED ORDER — LEVOFLOXACIN IN D5W 750 MG/150ML IV SOLN: 750.0000 mg | INTRAVENOUS | Status: DC

## 2016-02-27 MED ORDER — INSULIN GLARGINE 100 UNIT/ML ~~LOC~~ SOLN
5.0000 [IU] | Freq: Every day | SUBCUTANEOUS | Status: DC
  Administered 2016-02-27 – 2016-02-28 (×2): 5 [IU] via SUBCUTANEOUS
  Filled 2016-02-27 (×2): qty 0.05

## 2016-02-27 MED ORDER — INSULIN ASPART 100 UNIT/ML ~~LOC~~ SOLN: 0.0000 [IU] | Freq: Every day | SUBCUTANEOUS | Status: DC

## 2016-02-27 NOTE — Evaluation (Signed)
Physical Therapy Evaluation Patient Details Name: [name] MRN: [id number] DOB: 1930-03-26 Today's Date: 02/27/2016   History of Present Illness  Pt adm with acute on chronic respiratory failure and copd exacerbation. PMH - Rt femur fx 11/2014, cabg, htn, copd, dm, chf, afib,   Clinical Impression  Pt admitted with above diagnosis and presents to PT with functional limitations due to deficits listed below (See PT problem list). Pt needs skilled PT to maximize independence and safety to allow discharge to [facility]NF. Pt's weakness and confusion will limit pt's ability to return home currently. Doesn't appear to have 24 hour assist. Feel she can improve independence and function with SNF.     Follow Up Recommendations SNF    Equipment Recommendations  None recommended by PT    Recommendations for Other Services       Precautions / Restrictions Precautions Precautions: Fall Restrictions Weight Bearing Restrictions: No      Mobility  Bed Mobility Overal bed mobility: Needs Assistance Bed Mobility: Supine to Sit     Supine to sit: Min assist     General bed mobility comments: Assist to complete elevation of trunk into sitting. Pt able to initiate elevating trunk but unable to complete without assist  Transfers Overall transfer level: Needs assistance Equipment used: Rolling walker (2 wheeled) Transfers: Sit to/from Stand Sit to Stand: Min assist         General transfer comment: Assist to bring hips up and for balance. Initially pt with posterior loss of balance. Pt c/o feeling swimmy headed - BP 147/85 in sitting and SpO2 96% on 2L  Ambulation/Gait Ambulation/Gait assistance: Min assist Ambulation Distance (Feet): 10 Feet Assistive device: Rolling walker (2 wheeled) Gait Pattern/deviations: Step-through pattern;Decreased step length - right;Decreased step length - left;Shuffle;Trunk flexed Gait velocity: decr Gait velocity interpretation: <1.8 ft/sec, indicative  of risk for recurrent falls General Gait Details: Verbal encouragement to keep going. Assist for balance and support. HR to 122 with amb and SpO2 93% on 2L.  Stairs            Wheelchair Mobility    Modified Rankin (Stroke Patients Only)       Balance Overall balance assessment: Needs assistance Sitting-balance support: No upper extremity supported;Feet supported Sitting balance-Leahy Scale: Fair     Standing balance support: Bilateral upper extremity supported Standing balance-Leahy Scale: Poor Standing balance comment: walker and min A for static standing                             Pertinent Vitals/Pain Pain Assessment: Faces Faces Pain Scale: Hurts little more Pain Location: head Pain Descriptors / Indicators: Aching Pain Intervention(s): Limited activity within patient's tolerance;Monitored during session    Home Living Family/patient expects to be discharged to:: Skilled nursing facility Living Arrangements: Alone Available Help at Discharge: Family;Available PRN/intermittently Type of Home: House Home Access: Ramped entrance     Home Layout: One level Home Equipment: Cane - single point;Walker - 2 wheels;Bedside commode;Shower seat      Prior Function Level of Independence: Needs assistance   Gait / Transfers Assistance Needed: Pt unable to answer this. Just states she doesn't know and later states she walks without holding anything. Prior to hip fx in August she was using a walker.           Hand Dominance   Dominant Hand: Right    Extremity/Trunk Assessment   Upper Extremity Assessment: Defer to OT evaluation  Lower Extremity Assessment: Generalized weakness         Communication      Cognition Arousal/Alertness: Awake/alert   Overall Cognitive Status: Impaired/Different from baseline Area of Impairment: Orientation;Memory;Safety/judgement;Following commands;Problem solving;Awareness;Attention Orientation  Level: Time;Situation   Memory: Decreased short-term memory Following Commands: Follows one step commands consistently Safety/Judgement: Decreased awareness of deficits;Decreased awareness of safety Awareness:  (None) Problem Solving: Slow processing;Requires verbal cues;Requires tactile cues General Comments: Pt doesn't remember her recent hip fx or her SNF rehab stay    General Comments      Exercises     Assessment/Plan    PT Assessment Patient needs continued PT services  PT Problem List Decreased strength;Decreased activity tolerance;Decreased balance;Decreased mobility;Decreased knowledge of use of DME;Decreased cognition          PT Treatment Interventions DME instruction;Gait training;Functional mobility training;Therapeutic activities;Therapeutic exercise;Balance training;Patient/family education    PT Goals (Current goals can be found in the Care Plan section)  Acute Rehab PT Goals Patient Stated Goal: Not stated PT Goal Formulation: With patient Time For Goal Achievement: 03/12/16 Potential to Achieve Goals: Good    Frequency Min 3X/week   Barriers to discharge Decreased caregiver support Does not appear to have 24 hour assist    Co-evaluation               End of Session Equipment Utilized During Treatment: Gait belt;Oxygen Activity Tolerance: Patient limited by fatigue Patient left: in chair;with call bell/phone within reach;with chair alarm set Nurse Communication: Mobility status         Time: [id number] PT Time Calculation (min) (ACUTE ONLY): 27 min   Charges:   PT Evaluation $PT Eval Moderate Complexity: 1 Procedure     PT G Codes:        Mina Carlisi 02/27/2016, 9:50 AM [facility] [phone]

## 2016-02-27 NOTE — Progress Notes (Signed)
PROGRESS NOTE    [name]  ONG:[id number] DOB: [birth year] DOA: 02/24/2016 PCP: Dois DavenportICHTER,KAREN L., MD    Brief Narrative:  [name] is a 82 y.o. female with medical history significant of history of CABG, HTN, ascending and descending aortic aneurysm, oxygen-dependent COPD , PAF not a candidate for anticoagulation, carotid artery disease,and T2DM presenting with shortness of breath. She was seen by her PCP for cough and started on levaquin without improvement PTA. on Thursday and was started on Levaquin without any improvement. She denies any fevers, chills, chest pain. She did have her right hip fracture which was repaired in August 2017. She was discharged to a skilled nursing facility afterward and subsequently went home with family. DDimer was elevated but CTA was negative for PE. With IV steroids and antiviotics for COPD exacerbation due to HCAP, breathing has improved. She has returned to 2L O2 by nasal cannula.   Assessment & Plan:   Principal Problem:   Acute on chronic respiratory failure with hypoxemia (HCC) Active Problems:   Hx of CABG   Carotid artery disease (HCC)   Atrial fibrillation (HCC)   CAD (coronary artery disease)   COPD exacerbation (HCC)   HCAP (healthcare-associated pneumonia)   Chronic diastolic congestive heart failure (HCC)   DM type 2 (diabetes mellitus, type 2) (HCC)   Pressure ulcer   Anxiety   Pneumobilia  Acute on chronic hypoxemic respiratory failure: Secondary to COPD exacerbation, HCAP. CTA negative for PE.  - Requires 2 L of oxygen at baseline, 4L at admission, now back to baseline.  - Stable for discharge  Acute excerbation of COPD: Follows [name]  - Steroids 10/31 >> 11/4, transitioning 11/2 to po - Continue Dulera, Spiriva - Duonebs QID and albuterol q2h prn   DM type 2, well controlled: Previously on insulin but discontinued in setting of good control and advanced age - SSI while in hospital, hyperglycemia due to  steroids which have been de-escalated.   - Added lantus 5u + HS correction. Anticipate she can be discharged without insulin  HCAP (recent hospitalization and SNF in Aug 2017): bilateral effusions (R>L), bilateral upper lobe consolidations (R>L) - Vanc/zosyn >> po levaquin.   - Follow cultures - SLP for aspiration evaluation    Chronic diastolic heart failure: Without acute exacerbation - Echo on 12/19/2015 with EF 55-60% - Only takes lasix at home as needed for edema, will hold as patient does not appear fluid overloaded   Pneumobilia: Seen on CTA chest and further evaluated with CT abd/pelvis, related to recent instrumentation in the area was 10/2014 with ERCP.  - GI and general surgery curb-sided without additional recommendations.   CAD s/p CABG - Continue aspirin   Paroxysmal A Fib: CHADSVASc 7 - Not a good candidate for coumadin due to GI bleed in the past  - Continue digoxin, diltiazem, aspirin  - Telemetry monitoring   Anxiety. -Continue xanax and valium to avoid withdrawal. STRONGLY consider discontinuing one of these 2 benzo's in this 82yo female with chronic respiratory failure.  Pressure ulcer stage 2 of low back, sacrum, POA  - Wound care per WOC RN - Turn patient q2h   Recent right hip surgery after fall: S/p intramedullary nail to right femoral on 12/17/2015  - Fall precautions  DVT prophylaxis: subq hep  Code Status: DNR Family Communication: spoke with granddaughter  Disposition Plan: Able to be discharged in next 24 hours.  Consultants:   None  Procedures:   None  Antimicrobials:  Anti-infectives  Start     Dose/Rate Route Frequency Ordered Stop   02/27/16 1000  levofloxacin (LEVAQUIN) IVPB 750 mg  Status:  Discontinued    Comments:  Levaquin 750 mg IV q48h for CrCl < 50 mL/min   750 mg 100 mL/hr over 90 Minutes Intravenous Every 48 hours 02/27/16 0747 02/27/16 0908   02/27/16 1000  levofloxacin (LEVAQUIN) tablet 750 mg     750 mg Oral  Every 48 hours 02/27/16 0908     02/26/16 1000  vancomycin (VANCOCIN) IVPB 750 mg/150 ml premix  Status:  Discontinued     750 mg 150 mL/hr over 60 Minutes Intravenous Every 24 hours 02/25/16 0829 02/27/16 0736   02/25/16 0900  piperacillin-tazobactam (ZOSYN) IVPB 3.375 g  Status:  Discontinued     3.375 g 12.5 mL/hr over 240 Minutes Intravenous Every 8 hours 02/25/16 0829 02/27/16 0736   02/25/16 0900  vancomycin (VANCOCIN) IVPB 1000 mg/200 mL premix     1,000 mg 200 mL/hr over 60 Minutes Intravenous  Once 02/25/16 0829 02/25/16 1045   02/24/16 1900  azithromycin (ZITHROMAX) 500 mg in dextrose 5 % 250 mL IVPB  Status:  Discontinued     500 mg 250 mL/hr over 60 Minutes Intravenous Every 24 hours 02/24/16 1839 02/25/16 0813       Subjective: Patient has no new complaints. Says breathing is better. No chest pain, fever, cough or uncontrolled pain/anxiety.  Objective: Vitals:   02/27/16 0835 02/27/16 0913 02/27/16 0949 02/27/16 1130  BP:  (!) 147/85  136/76  Pulse:  83 (!) 101 76  Resp:    18  Temp:    97.8 F (36.6 C)  TempSrc:    Oral  SpO2: 98% 96%  97%  Weight:      Height:        Intake/Output Summary (Last 24 hours) at 02/27/16 1407 Last data filed at 02/27/16 0925  Gross per 24 hour  Intake              393 ml  Output                0 ml  Net              393 ml   Filed Weights   02/25/16 0653 02/26/16 0552 02/27/16 0601  Weight: 57.9 kg (127 lb 9.6 oz) 58.3 kg (128 lb 8.3 oz) 59.6 kg (131 lb 6.4 oz)    Examination:  Constitutional: NAD, calm, comfortable Eyes: PERRL, lids and conjunctivae normal ENMT: Mucous membranes are dry. Posterior pharynx clear of any exudate or lesions.Normal dentition.  Neck: normal, supple, no masses, no thyromegaly Respiratory: Nonlabored on 2L O2 this AM with globally diminished breath sounds, most in the bases. No crackles. + scattered wheezes. Cardiovascular: Irregular rhythm, regular rate. no murmurs / rubs / gallops. No  extremity edema. 2+ pedal pulses.  Abdomen: no tenderness, no masses palpated. No hepatosplenomegaly. Bowel sounds positive.  Musculoskeletal: no clubbing / cyanosis. No joint deformity upper and lower extremities. Good ROM, no contractures. Normal muscle tone.  Skin: + Stage II pressure ulcer in low back/sacrum covered dressing c/d/i Neurologic: CN 2-12 grossly intact. Sensation intact, Strength 5/5 in all 4.  Psychiatric: Normal judgment and insight. Alert and oriented x 3.    Data Reviewed: I have personally reviewed following labs and imaging studies  CBC:  Recent Labs Lab 02/24/16 1412 02/25/16 0318 02/26/16 0306  WBC 9.4 9.6 7.2  NEUTROABS 8.3*  --   --  HGB 11.4* 11.0* 10.0*  HCT 36.5 35.2* 32.7*  MCV 92.6 91.9 92.4  PLT 272 262 241   Basic Metabolic Panel:  Recent Labs Lab 02/24/16 1412 02/25/16 0318 02/26/16 0306 02/27/16 0436  NA 135 130* 131* 131*  K 4.8 4.3 5.2* 4.9  CL 98* 93* 93* 95*  CO2 30 29 30 30   GLUCOSE 112* 116* 230* 204*  BUN 17 13 26* 31*  CREATININE 0.72 0.68 1.14* 1.05*  CALCIUM 10.4* 10.1 9.9 9.7   GFR: Estimated Creatinine Clearance: 37.5 mL/min (by C-G formula based on SCr of 1.05 mg/dL (H)). Liver Function Tests:  Recent Labs Lab 02/24/16 1412  AST 14*  ALT 6*  ALKPHOS 69  BILITOT 0.6  PROT 6.1*  ALBUMIN 2.8*   No results for input(s): LIPASE, AMYLASE in the last 168 hours. No results for input(s): AMMONIA in the last 168 hours. Coagulation Profile: No results for input(s): INR, PROTIME in the last 168 hours. Cardiac Enzymes: No results for input(s): CKTOTAL, CKMB, CKMBINDEX, TROPONINI in the last 168 hours. BNP (last 3 results) No results for input(s): PROBNP in the last 8760 hours. HbA1C: No results for input(s): HGBA1C in the last 72 hours. CBG:  Recent Labs Lab 02/26/16 1148 02/26/16 1734 02/26/16 2317 02/27/16 0621 02/27/16 1123  GLUCAP 231* 213* 263* 201* 235*   Lipid Profile: No results for input(s):  CHOL, HDL, LDLCALC, TRIG, CHOLHDL, LDLDIRECT in the last 72 hours. Thyroid Function Tests: No results for input(s): TSH, T4TOTAL, FREET4, T3FREE, THYROIDAB in the last 72 hours. Anemia Panel: No results for input(s): VITAMINB12, FOLATE, FERRITIN, TIBC, IRON, RETICCTPCT in the last 72 hours. Sepsis Labs: No results for input(s): PROCALCITON, LATICACIDVEN in the last 168 hours.  No results found for this or any previous visit (from the past 240 hour(s)).     Radiology Studies: No results found.    Scheduled Meds: . aspirin EC  81 mg Oral Daily  . digoxin  0.0625 mg Oral Daily  . diltiazem  120 mg Oral Daily  . feeding supplement (PRO-STAT SUGAR FREE 64)  30 mL Oral Daily  . heparin  5,000 Units Subcutaneous Q8H  . insulin aspart  0-5 Units Subcutaneous QHS  . insulin aspart  0-9 Units Subcutaneous TID WC  . insulin glargine  5 Units Subcutaneous Daily  . ipratropium-albuterol  3 mL Nebulization QID  . levofloxacin  750 mg Oral Q48H  . mouth rinse  15 mL Mouth Rinse BID  . methylPREDNISolone (SOLU-MEDROL) injection  80 mg Intravenous Q6H  . mometasone-formoterol  2 puff Inhalation BID  . pantoprazole  40 mg Oral Daily  . sodium chloride flush  3 mL Intravenous Q12H  . sodium chloride flush  3 mL Intravenous Q12H  . tiotropium  18 mcg Inhalation Daily   Continuous Infusions:    LOS: 2 days   Time spent: 25 minutes   Hazeline Junkeryan Clinton Wahlberg, MD [facility] Pager [phone]  [email or web address] Password [email or web address] 02/27/2016, 2:07 PM

## 2016-02-27 NOTE — Progress Notes (Signed)
[facility] to [facility]; Alexis GoodellB Dessa Ledee RN,MHA,BSN [phone]

## 2016-02-27 NOTE — Progress Notes (Signed)
Inpatient Diabetes Program Recommendations  AACE/ADA: New Consensus Statement on Inpatient Glycemic Control (2015)  Target Ranges:  Prepandial:   less than 140 mg/dL      Peak postprandial:   less than 180 mg/dL (1-2 hours)      Critically ill patients:  140 - 180 mg/dL   Results for [name], [name] (MRN [id number]) as of 02/27/2016 08:48  Ref. Range 02/26/2016 05:45 02/26/2016 11:48 02/26/2016 17:34 02/26/2016 23:17 02/27/2016 06:21  Glucose-Capillary Latest Ref Range: 65 - 99 mg/dL 956198 (H) 213231 (H) 086213 (H) 263 (H) 201 (H)    Review of Glycemic Control  Current orders for Inpatient glycemic control: Novolog 0-9 units TID with meals  Inpatient Diabetes Program Recommendations: Insulin - Basal: Please consider ordering Lantus 5 units QHS.  Correction (SSI): Please consider ordering Novolog bedtime correction scale. Insulin - Meal Coverage: If steroids are continued and patient is eating at least 50% of meals, please consider ordering Novolog 2 units TID with meals for meal coverage.  Thanks, Orlando PennerMarie Jarriel Papillion, RN, MSN, CDE Diabetes Coordinator Inpatient Diabetes Program [phone] (Team Pager from 8am to 5pm)

## 2016-02-27 NOTE — Evaluation (Signed)
Occupational Therapy Evaluation Patient Details Name: [name] MRN: [id number] DOB: [birth year] Today's Date: 02/27/2016    History of Present Illness Pt adm with acute on chronic respiratory failure and copd exacerbation. PMH - Rt femur fx 11/2014, cabg, htn, copd, dm, chf, afib,    Clinical Impression   Pt is not able to report her PLOF due to impaired cognition. Home information gathered from previous admission. Pt presents with poor activity tolerance with elevated HR ( to upper 120s) with minimal activity. Pt demonstrates poor balance and high fall risk.  Recommending SNF. Will follow acutely.    Follow Up Recommendations  SNF;Supervision/Assistance - 24 hour    Equipment Recommendations       Recommendations for Other Services       Precautions / Restrictions Precautions Precautions: Fall Restrictions Weight Bearing Restrictions: No      Mobility Bed Mobility Overal bed mobility: Needs Assistance Bed Mobility: Supine to Sit     Supine to sit: Min assist     General bed mobility comments: Assist to complete elevation of trunk into sitting. Pt able to initiate elevating trunk but unable to complete without assist  Transfers Overall transfer level: Needs assistance Equipment used: Rolling walker (2 wheeled) Transfers: Sit to/from Stand Sit to Stand: Min assist         General transfer comment: Assist to bring hips up and for balance. Initially pt with posterior loss of balance. Pt c/o feeling swimmy headed - BP 147/85 in sitting and SpO2 96% on 2L    Balance Overall balance assessment: Needs assistance Sitting-balance support: No upper extremity supported;Feet supported Sitting balance-Leahy Scale: Fair     Standing balance support: Bilateral upper extremity supported Standing balance-Leahy Scale: Poor Standing balance comment: walker and min A for static standing                            ADL Overall ADL's : Needs  assistance/impaired Eating/Feeding: Independent;Bed level   Grooming: Wash/dry hands;Wash/dry face;Sitting;Supervision/safety   Upper Body Bathing: Moderate assistance;Sitting   Lower Body Bathing: Total assistance;Sit to/from stand   Upper Body Dressing : Minimal assistance;Sitting   Lower Body Dressing: Total assistance;Sit to/from stand   Toilet Transfer: Minimal assistance;Ambulation;RW           Functional mobility during ADLs: Minimal assistance;Rolling walker       Vision     Perception     Praxis      Pertinent Vitals/Pain Pain Assessment: Faces Faces Pain Scale: Hurts little more Pain Location: head Pain Descriptors / Indicators: Aching Pain Intervention(s): Monitored during session;Repositioned     Hand Dominance Right   Extremity/Trunk Assessment Upper Extremity Assessment Upper Extremity Assessment: Generalized weakness   Lower Extremity Assessment Lower Extremity Assessment: Defer to PT evaluation   Cervical / Trunk Assessment Cervical / Trunk Assessment: Kyphotic   Communication Communication Communication: No difficulties   Cognition Arousal/Alertness: Awake/alert Behavior During Therapy: WFL for tasks assessed/performed Overall Cognitive Status: Impaired/Different from baseline Area of Impairment: Orientation;Memory;Safety/judgement;Following commands;Problem solving;Awareness;Attention Orientation Level: Time;Situation   Memory: Decreased short-term memory Following Commands: Follows one step commands consistently Safety/Judgement: Decreased awareness of deficits;Decreased awareness of safety Awareness:  (None) Problem Solving: Slow processing;Requires verbal cues;Requires tactile cues General Comments: Pt doesn't remember her recent hip fx or her SNF rehab stay   General Comments       Exercises       Shoulder Instructions      Home  Living Family/patient expects to be discharged to:: Skilled nursing facility Living  Arrangements: Alone Available Help at Discharge: Family;Available PRN/intermittently Type of Home: House Home Access: Ramped entrance     Home Layout: One level     Bathroom Shower/Tub: Chief Strategy OfficerTub/shower unit   Bathroom Toilet: Standard Bathroom Accessibility: Yes How Accessible: Accessible via walker Home Equipment: Cane - single point;Walker - 2 wheels;Bedside commode;Shower seat          Prior Functioning/Environment Level of Independence: Needs assistance  Gait / Transfers Assistance Needed: Pt unable to answer this. Just states she doesn't know and later states she walks without holding anything. Prior to hip fx in August she was using a walker.     Comments: On home O2 2L        OT Problem List: Decreased activity tolerance;Impaired balance (sitting and/or standing);Decreased knowledge of use of DME or AE;Pain;Cardiopulmonary status limiting activity;Decreased cognition;Decreased strength   OT Treatment/Interventions: Self-care/ADL training;DME and/or AE instruction;Cognitive remediation/compensation;Therapeutic activities;Patient/family education;Balance training    OT Goals(Current goals can be found in the care plan section) Acute Rehab OT Goals Patient Stated Goal: Not stated OT Goal Formulation: Patient unable to participate in goal setting Time For Goal Achievement: 03/12/16 Potential to Achieve Goals: Good ADL Goals Pt Will Perform Grooming: with supervision;standing Pt Will Perform Upper Body Bathing: with supervision;sitting Pt Will Perform Lower Body Bathing: with supervision;sit to/from stand Pt Will Transfer to Toilet: with supervision;ambulating;bedside commode Pt Will Perform Toileting - Clothing Manipulation and hygiene: with supervision;sit to/from stand  OT Frequency: Min 2X/week   Barriers to D/C: Decreased caregiver support          Co-evaluation              End of Session Equipment Utilized During Treatment: Gait belt;Rolling  walker;Oxygen  Activity Tolerance: Patient limited by fatigue (HR to 140 with ambulation) Patient left: in chair;with call bell/phone within reach;with chair alarm set   Time: [id number] OT Time Calculation (min): 26 min Charges:  OT General Charges $OT Visit: 1 Procedure OT Evaluation $OT Eval Moderate Complexity: 1 Procedure G-Codes:    Evern BioMayberry, [place] Mckeough Lynn 02/27/2016, 10:11 AM  [phone]

## 2016-02-27 NOTE — Clinical Social Work Note (Addendum)
CSW called and left message with patient's granddaughter for patient's son to discuss PT recommendations. Per patient's granddaughter, patient is getting HHPT. Will await call back from patient's son to discuss SNF.  Karen CourtSarah Aaleeyah [name], CSW [phone]  2:18 pm Patient's granddaughter, Karen JunkerMarsha [phone]), called CSW back. CSW explained PT recommendation for SNF. Per granddaughter, patient has been to [facility] multiple times after hospitalizations due to fractures, etc. This last time patient only stayed up until day 21 or 22 and was discharged because she refused to participate in therapies. Patient's granddaughter attributed this to her mental status. Patient has been recently getting HHPT/OT and RN with [facility] and it has been working well because family is there to reinforce the importance of therapies. CSW asked that patient's granddaughter have patient's son call CSW to confirm interest in HHPT instead of SNF. RNCM notified.  Karen CourtSarah Edgel [name], CSW [phone]

## 2016-02-28 LAB — BASIC METABOLIC PANEL
Anion gap: 5 (ref 5–15)
BUN: 38 mg/dL — AB (ref 6–20)
CHLORIDE: 98 mmol/L — AB (ref 101–111)
CO2: 29 mmol/L (ref 22–32)
Calcium: 10.1 mg/dL (ref 8.9–10.3)
Creatinine, Ser: 0.94 mg/dL (ref 0.44–1.00)
GFR calc Af Amer: >60 mL/min (ref >60)
GFR calc non Af Amer: 54 mL/min — ABNORMAL LOW (ref >60)
GLUCOSE: 127 mg/dL — AB (ref 65–99)
POTASSIUM: 4.3 mmol/L (ref 3.5–5.1)
Sodium: 132 mmol/L — ABNORMAL LOW (ref 135–145)

## 2016-02-28 LAB — GLUCOSE, CAPILLARY
Glucose-Capillary: 164 mg/dL — ABNORMAL HIGH (ref 65–99)
Glucose-Capillary: 150 mg/dL — ABNORMAL HIGH (ref 65–99)

## 2016-02-28 MED ORDER — LEVOFLOXACIN 750 MG PO TABS: 750.0000 mg | ORAL_TABLET | ORAL | 0 refills | Status: AC

## 2016-02-28 MED ORDER — PREDNISONE 20 MG PO TABS: 20.0000 mg | ORAL_TABLET | Freq: Every day | ORAL | 0 refills | Status: AC

## 2016-02-28 NOTE — Care Management Note (Signed)
Case Management Note  Patient Details  Name: [name] MRN: [id number] Date of Birth: 1929/11/05  Subjective/Objective:                    Action/Plan: Patient will be going home rather than SNF, Case manager spoke with her son Loraine LericheMark, who asked that I call his daughter Mindi JunkerMarsha to discuss. CM spoke with Mindi JunkerMarsha551 [phone], she is aware of patient's discharge and will transport her home this afternoon. Patient has no DME needs. CM contacted Anibal Hendersonim Henderson, [facility] and informed him of patient's discharge and need for resumption of Home Health.  Expected Discharge Date:  02/28/16               Expected Discharge Plan:  Home w Home Health Services  In-House Referral:     Discharge planning Services  CM Consult  Post Acute Care Choice:  Home Health Choice offered to:  Patient  DME Arranged:  N/A DME Agency:     HH Arranged:  [facility]  Status of Service:  Completed, signed off  If discussed at Long Length of Stay Meetings, dates discussed:    Additional Comments:  Durenda GuthrieBrady, Suleyma Wafer Naomi, RN 02/28/2016, 11:44 AM

## 2016-02-28 NOTE — Clinical Social Work Note (Signed)
CSW received call from patient's son, [name] [phone]). He restated what his daughter, [name], said yesterday about patient doing better in therapy while at home. Mr. [name] prefers to continue home healthcare with [facility]. Patient is living with her son and granddaughter as of right now.  CSW signing off. Consult again if any social work needs arise.  Charlynn CourtSarah Alesia Oshields, CSW [phone]

## 2016-02-28 NOTE — Care Management Important Message (Signed)
Important Message  Patient Details  Name: [name] MRN: [id number] Date of Birth: Sep 25, 1929   Medicare Important Message Given:  Yes    Gage Treiber 02/28/2016, 11:07 AM

## 2016-02-28 NOTE — Discharge Summary (Signed)
Physician Discharge Summary  [name] ZOX:[id number] DOB: October 27, 1929 DOA: 02/24/2016  PCP: Dois Davenport., [name]  Admit date: 02/24/2016 Discharge date: 02/28/2016  Admitted From: Home Disposition: Home   Recommendations for Outpatient Follow-up:  1. Follow up with PCP as scheduled 2. Monitor respiratory status: Discharged on preadmission home oxygen requirement of 2 LPM. Finishing antibiotics for HCAP and steroids for COPD exacerbation the day after discharge. 3. Monitor blood glucose. Steroids caused significant hyperglycemia which was treated with sliding scale insulin in the hospital. With the steroid taper finishing the day after discharge, starting insulin was thought to have more risks than benefits.  4. Monitor functional status: Pt with recent fracture, discharged to SNF but refused to participate in PT. SNF again recommended for disposition but family reports compliance with therapy is improved when at home where family can reinforce need for participation.  Home Health: PT, OT, RN resumed at discharge Equipment/Devices: 2LPM home oxygen, no new DME  Discharge Condition: Stable CODE STATUS: DNR Diet recommendation: Heart healthy, carbohydrate-limited.  Brief/Interim Summary: Karen Shampine Reynoldsis a 82 y.o.femalewith medical history significant of history of CABG, HTN, ascending and descending aortic aneurysm, oxygen-dependent COPD , PAF not a candidate for anticoagulation, carotid artery disease,and T2DM presenting with shortness of breath. She was seen by her PCP for cough and started on levaquin without improvement PTA. on Thursday and was started on Levaquin without any improvement. She denies any fevers, chills, chest pain. She did have her right hip fracture which was repaired in August 2017. She was discharged to a skilled nursing facility afterward and subsequently went home with family. DDimer was elevated but CTA was negative for PE. With IV steroids and antiviotics for  COPD exacerbation due to HCAP, breathing has improved. She has returned to 2L O2 by nasal cannula.   Discharge Diagnoses:  Principal Problem:   Acute on chronic respiratory failure with hypoxemia (HCC) Active Problems:   Hx of CABG   Carotid artery disease (HCC)   Atrial fibrillation (HCC)   CAD (coronary artery disease)   COPD exacerbation (HCC)   HCAP (healthcare-associated pneumonia)   Chronic diastolic congestive heart failure (HCC)   DM type 2 (diabetes mellitus, type 2) (HCC)   Pressure ulcer   Anxiety   Pneumobilia  Acute on chronic hypoxemic respiratory failure: Secondary to COPD exacerbation, HCAP. CTA negative for PE.  - Requires 2 L of oxygen at baseline, 4L at admission, now back to baseline.  - Stable for discharge  Acute excerbation of COPD: Follows [facility]  - Steroids 10/31 >> 11/4, transitioning 11/2 to po - Continue Dulera, Spiriva - Duonebs QID and albuterol q2h prn   DM type 2, well controlled: Previously on insulin but discontinued in setting of good control and advanced age - SSI while in hospital, hyperglycemia due to steroids which have been de-escalated with improvement in glucose. No evidence of DKA/HHNK.   - Anticipate she can be discharged without insulin  HCAP (recent hospitalization and SNF in Aug 2017): bilateral effusions (R>L), bilateral upper lobe consolidations (R>L) - Azithromycin 10/30 >> Vanc/zosyn 10/31 >> po levaquin 11/2 q48h for impaired renal function, last dose 11/4.    - Follow cultures - Consider follow up imaging to verify clearance of consolidations/effusions.   Chronic diastolic heart failure: Without acute exacerbation - Echo on 12/19/2015 with EF 55-60% - Only takes lasix at home as needed for edema, this was held and pt is euvolemic at discharge.  Pneumobilia: Seen on CTA chest and further evaluated  with CT abd/pelvis, related to recent instrumentation in the area was 10/2014 with ERCP.  - GI and general  surgery curb-sided without additional recommendations.   CAD s/p CABG - Continue aspirin   Paroxysmal A Fib: CHADSVASc 7 - Not a good candidate for coumadin due to GI bleed in the past  - Continue digoxin, diltiazem, aspirin  - Telemetry monitoring: Rate-controlled   Anxiety. -Continue xanax and valium to avoid withdrawal. STRONGLY consider discontinuing one of these 2 benzo's in this 82yo female with chronic respiratory failure.  Pressure ulcer stage 2 of low back, sacrum, POA  - Wound care per [facility] RN - Turn patient q2h   Recent right hip surgery after fall: S/p intramedullary nail to right femoral on 12/17/2015  - Fall precautions  Discharge Instructions Discharge Instructions    Diet - low sodium heart healthy    Complete by:  As directed    Discharge instructions    Complete by:  As directed    You were admitted with trouble breathing due to pneumonia and a COPD flare up. You have returned to your regular state of 2 liters of oxygen and can be discharged with the following recommendations:  - Finish steroids by taking PREDNISONE 1 tablet tomorrow morning (11/4) - Finish antibiotics by taking LEVAQUIN 1 tablet tomorrow morning (11/4) - Follow up with you primary care doctor. This will be scheduled for you prior to discharge.  - Continue using oxygen and seek medical care if your symptoms return. - Your medication list includes valium and xanax - Either of these alone is a high risk medication for you and together the risk of falling, breathing problems and even death is very significant. It is recommended that you minimize these medications and do not take them both at the same time until you follow up with your doctor.   Increase activity slowly    Complete by:  As directed        Medication List    TAKE these medications   albuterol 108 (90 Base) MCG/ACT inhaler Commonly known as:  PROVENTIL HFA;VENTOLIN HFA Inhale 2 puffs into the lungs every 4 (four) hours as  needed for wheezing or shortness of breath.   albuterol (2.5 MG/3ML) 0.083% nebulizer solution Commonly known as:  PROVENTIL Take 2.5 mg by nebulization every 6 (six) hours as needed for wheezing.   ALPRAZolam 0.25 MG tablet Commonly known as:  XANAX Take 1 tablet (0.25 mg total) by mouth at bedtime as needed for anxiety.   aspirin EC 81 MG tablet Take 81 mg by mouth daily.   diazepam 2 MG tablet Commonly known as:  VALIUM Take 1 tablet (2 mg total) by mouth every 6 (six) hours as needed for anxiety.   Digoxin 62.5 MCG Tabs Take 0.0625 mg by mouth daily.   diltiazem 120 MG 24 hr capsule Commonly known as:  DILACOR XR Take 1 capsule (120 mg total) by mouth daily.   feeding supplement (PRO-STAT SUGAR FREE 64) Liqd Take 30 mLs by mouth daily.   feeding supplement Liqd Take 1 Container by mouth 2 (two) times daily as needed (for supplementatioin).   furosemide 40 MG tablet Commonly known as:  LASIX Take 1 tablet (40 mg total) by mouth daily as needed for fluid or edema.   HYDROcodone-acetaminophen 5-325 MG tablet Commonly known as:  NORCO Take 1-2 tablets by mouth every 6 (six) hours as needed for moderate pain. MAXIMUM TOTAL ACETAMINOPHEN DOSE IS 4000 MG PER DAY   levofloxacin  750 MG tablet Commonly known as:  LEVAQUIN Take 1 tablet (750 mg total) by mouth every other day. Start taking on:  02/29/2016   magnesium oxide 400 MG tablet Commonly known as:  MAG-OX Take 400 mg by mouth daily.   nitroGLYCERIN 0.4 MG SL tablet Commonly known as:  NITROSTAT Place 0.4 mg under the tongue every 5 (five) minutes as needed for chest pain.   ondansetron 8 MG tablet Commonly known as:  ZOFRAN Take 1 tablet (8 mg total) by mouth every 8 (eight) hours as needed for nausea or vomiting.   OXYGEN Inhale 2-2.5 L into the lungs continuous. 2L continously   pantoprazole 40 MG tablet Commonly known as:  PROTONIX Take 1 tablet (40 mg total) by mouth daily.   polyethylene glycol  packet Commonly known as:  MIRALAX / GLYCOLAX Take 17 g by mouth daily as needed for mild constipation or moderate constipation. Reported on 07/10/2015   predniSONE 20 MG tablet Commonly known as:  DELTASONE Take 1 tablet (20 mg total) by mouth daily with breakfast. Start taking on:  02/29/2016   SPIRIVA RESPIMAT 2.5 MCG/ACT Aers Generic drug:  Tiotropium Bromide Monohydrate Inhale 2 puffs into the lungs daily.   SYMBICORT 160-4.5 MCG/ACT inhaler Generic drug:  budesonide-formoterol USE 2 INHALATIONS TWICE A DAY      Follow-up Information    [facility]   Why:  [facility] will continue to do your home health care at your home Contact information: [address] [place] [address] [phone]        Dois DavenportICHTER,Karen L., [name].   Specialty:  Family Medicine Contact information: [address] [place] [address] [phone]          Allergies  Allergen Reactions  . Other Other (See Comments)    TETANUS-can only take 1/2 dose at one time, can take the other 1/2 dose about 3 days later  . Codeine Nausea And Vomiting  . Sulfonamide Derivatives Nausea And Vomiting  . Warfarin     H/o AVM's prechief complaintcluding anticoagulation  . Penicillins Itching, Rash and Other (See Comments)    Nasal itching   . Ranitidine Hcl Itching and Rash    Consultations:  None  Procedures/Studies: Dg Chest 2 View  Result Date: 02/24/2016 CLINICAL DATA:  82 year old female with congestion shortness breath for the past week. Hypertension. Subsequent encounter. EXAM: CHEST  2 VIEW COMPARISON:  02/20/2016. FINDINGS: Asymmetric mild pulmonary edema superimposed upon chronic changes. Small pleural effusions have increased in size since prior exam. Post CABG.  Heart size within normal limits. Calcified mildly tortuous aorta. Thoracic kyphosis without focal compression fracture. Prominent nipple shadows. IMPRESSION: Asymmetric mild pulmonary edema  superimposed upon chronic changes. Small pleural effusions have increased size since prior exam. Aortic atherosclerosis. Electronically Signed   By: Lacy DuverneySteven  Olson M.D.   On: 02/24/2016 13:42   Dg Chest 2 View  Result Date: 02/20/2016 CLINICAL DATA:  Shortness of breath and productive cough for 2 weeks EXAM: CHEST  2 VIEW COMPARISON:  12/16/2015 FINDINGS: Cardiac shadow is stable. Postsurgical changes are again seen. Small bilateral pleural effusions are noted. The lungs are hyperinflated consistent with COPD. Chronic apical scarring is noted. IMPRESSION: COPD and bilateral pleural effusions. Chronic changes are noted similar to that seen on prior CT examination. Electronically Signed   By: Alcide CleverMark  Lukens M.D.   On: 02/20/2016 17:25   Ct Angio Chest Pe W And/or Wo Contrast  Result Date: 02/24/2016 CLINICAL  DATA:  Shortness of breath, hard to breathe lot of congestion EXAM: CT ANGIOGRAPHY CHEST WITH CONTRAST TECHNIQUE: Multidetector CT imaging of the chest was performed using the standard protocol during bolus administration of intravenous contrast. Multiplanar CT image reconstructions and MIPs were obtained to evaluate the vascular anatomy. CONTRAST:  100 cc of Isovue 370 intravenous COMPARISON:  None. FINDINGS: Cardiovascular: There is no evidence for filling defect within the central or segmental pulmonary arteries to suggest the presence of an acute embolus. There is ectasia of the ascending segment of the aorta, measuring up to 4 cm. There are postsurgical changes of the mediastinum. Coronary artery calcifications. No large pericardial effusion. Mediastinum/Nodes: Mildly prominent precarinal lymph nodes. Measuring up to 1 cm. Trachea and mainstem bronchi grossly unremarkable. Esophagus is unremarkable. Imaged thyroid within normal limits. Carotid artery calcifications. Lungs/Pleura: There are moderate bilateral right greater than left pleural effusions. There is hazy atelectasis or infiltrate in the right  lower lobe. Irregular areas of consolidation are present within the bilateral right greater than left upper lobes. There is bronchiectasis within the right upper lobe. Bilateral emphysematous disease. Upper Abdomen: Upper abdominal images demonstrate atherosclerosis. Incompletely visualized liver demonstrates air density branching structures in the left hepatic lobe. Incompletely visualized. Musculoskeletal: Kyphosis of the spine with minimal wedging of mid to lower thoracic vertebra. Multilevel vacuum discs and degenerative changes. Review of the MIP images confirms the above findings. IMPRESSION: 1. No CT evidence for acute pulmonary embolus. 2. Stable ectasia of the ascending segment of the aorta up to 4 cm with atherosclerosis. 3. Moderate bilateral right greater than left pleural effusions. Hazy atelectasis or infiltrate right lower lobe with irregular areas of bilateral right greater than left upper lobe consolidations. Bronchiectasis in the right upper lobe. Bilateral emphysematous disease. 4. Partially visualized liver demonstrates linear branching air density structures in the left hepatic lobe, probably representing pneumobilia. Correlation with surgical history is necessary, otherwise further evaluation with dedicated abdominal pelvic CT may be obtained. Electronically Signed   By: Jasmine Pang M.D.   On: 02/24/2016 17:55   Ct Abdomen Pelvis W Contrast  Result Date: 02/25/2016 CLINICAL DATA:  82 y/o  F; pneumobilia and abdominal pain. EXAM: CT ABDOMEN AND PELVIS WITH CONTRAST TECHNIQUE: Multidetector CT imaging of the abdomen and pelvis was performed using the standard protocol following bolus administration of intravenous contrast. CONTRAST:  ISOVUE-300 IOPAMIDOL (ISOVUE-300) INJECTION 61% COMPARISON:  11/18/2014 CT abdomen and pelvis. Pelvic radiograph 12/17/2015. FINDINGS: Lower chest: Moderate right greater than left bilateral pleural effusions. Coronary artery calcifications.  Hepatobiliary: Pneumobilia within the left lobe of the liver. Cholecystectomy. Stable lucency within segment IVa near the IVC, probably a cyst. No significant intra or extrahepatic biliary ductal dilatation. Pancreas: Unremarkable. No pancreatic ductal dilatation or surrounding inflammatory changes. Spleen: Normal in size without focal abnormality. Adrenals/Urinary Tract: Normal adrenal glands. No obstructive uropathy or urinary stone disease. Several small foci of cortical thinning compatible with scars. Otherwise no focal renal abnormality. Bladder is unremarkable. Stomach/Bowel: Stomach is within normal limits. Appendix appears normal. No evidence of bowel wall thickening, distention, or inflammatory changes. Extensive sigmoid diverticulosis without evidence for diverticulitis. Small focus of enteroenteric intussusception of proximal duodenum of unlikely clinical significance. Vascular/Lymphatic: Extensive aortic atherosclerosis with concentric calcifications extending into the bilateral iliofemoral arteries. There is near occlusion of the left common iliac artery. Infrarenal abdominal aortic aneurysm measuring up to 3.0 cm. No enlarged abdominal or pelvic lymph nodes. Reproductive: Status post hysterectomy. No adnexal masses. Other: No abdominal wall hernia or  abnormality. No abdominopelvic ascites. Musculoskeletal: Left total hip prosthesis and right femoral female and proximal compression screw with a comminuted intertrochanteric fracture similar in comparison with prior pelvic radiographs given differences in technique. IMPRESSION: 1. Pneumobilia within the left lobe of the liver imaging common bile duct without intra or extrahepatic biliary ductal dilatation, possibly related to sphincter of Oddi dysfunction. Correlation with liver enzymes is recommended. 2. Sigmoid diverticulosis without evidence of diverticulitis. 3. Extensive aortic atherosclerosis with near occlusion of left common iliac artery. 4.  Infrarenal abdominal aorta measuring up to 3 cm. Recommend followup by ultrasound in 3 years. This recommendation follows ACR consensus guidelines: White Paper of the ACR Incidental Findings Committee II on Vascular Findings. J Am Coll Radiol 2013; 10:789-794. 5. Comminuted right proximal femur intertrochanteric fracture similar in comparison with prior pelvic radiographs given differences in technique Electronically Signed   By: Mitzi Hansen M.D.   On: 02/25/2016 02:07     Subjective: Patient at her baseline state of confusion. No dyspnea, chest pain, or other complaints. Granddaughter at bedside without concerns, ready to take her home.  Discharge Exam: Vitals:   02/28/16 0358 02/28/16 0932  BP: 123/63 127/66  Pulse: (!) 101 88  Resp: 18 18  Temp: 97.7 F (36.5 C) 97.7 F (36.5 C)   Vitals:   02/27/16 2007 02/28/16 0358 02/28/16 0811 02/28/16 0932  BP: 131/78 123/63  127/66  Pulse: 95 (!) 101  88  Resp: 16 18  18   Temp: 97.5 F (36.4 C) 97.7 F (36.5 C)  97.7 F (36.5 C)  TempSrc: Oral Oral  Oral  SpO2: 99% 100% 97% 100%  Weight:  59.6 kg (131 lb 8 oz)    Height:       General: Pt is alert, awake, not in acute distress Cardiovascular: Irregualr rhythm, regular rate, S1/S2 +, no rubs, no gallops Respiratory: Nonlabored on 2L O2 this AM with globally diminished breath sounds, most in the bases. No crackles or wheezes. Abdominal: Soft, NT, ND, bowel sounds + Extremities: no edema, no cyanosis  The results of significant diagnostics from this hospitalization (including imaging, microbiology, ancillary and laboratory) are listed below for reference.    Microbiology: No results found for this or any previous visit (from the past 240 hour(s)).   Labs: BNP (last 3 results)  Recent Labs  03/18/15 1605 07/10/15 0945 02/24/16 1412  BNP 153.9* 157.2* 98.4   Basic Metabolic Panel:  Recent Labs Lab 02/24/16 1412 02/25/16 0318 02/26/16 0306 02/27/16 0436  02/28/16 0802  NA 135 130* 131* 131* 132*  K 4.8 4.3 5.2* 4.9 4.3  CL 98* 93* 93* 95* 98*  CO2 30 29 30 30 29   GLUCOSE 112* 116* 230* 204* 127*  BUN 17 13 26* 31* 38*  CREATININE 0.72 0.68 1.14* 1.05* 0.94  CALCIUM 10.4* 10.1 9.9 9.7 10.1   Liver Function Tests:  Recent Labs Lab 02/24/16 1412  AST 14*  ALT 6*  ALKPHOS 69  BILITOT 0.6  PROT 6.1*  ALBUMIN 2.8*   No results for input(s): LIPASE, AMYLASE in the last 168 hours. No results for input(s): AMMONIA in the last 168 hours. CBC:  Recent Labs Lab 02/24/16 1412 02/25/16 0318 02/26/16 0306  WBC 9.4 9.6 7.2  NEUTROABS 8.3*  --   --   HGB 11.4* 11.0* 10.0*  HCT 36.5 35.2* 32.7*  MCV 92.6 91.9 92.4  PLT 272 262 241   Cardiac Enzymes: No results for input(s): CKTOTAL, CKMB, CKMBINDEX, TROPONINI in the last 168  hours. BNP: Invalid input(s): POCBNP CBG:  Recent Labs Lab 02/27/16 0621 02/27/16 1123 02/27/16 1717 02/27/16 2059 02/28/16 0625  GLUCAP 201* 235* 129* 183* 164*   D-Dimer No results for input(s): DDIMER in the last 72 hours. Hgb A1c No results for input(s): HGBA1C in the last 72 hours. Lipid Profile No results for input(s): CHOL, HDL, LDLCALC, TRIG, CHOLHDL, LDLDIRECT in the last 72 hours. Thyroid function studies No results for input(s): TSH, T4TOTAL, T3FREE, THYROIDAB in the last 72 hours.  Invalid input(s): FREET3 Anemia work up No results for input(s): VITAMINB12, FOLATE, FERRITIN, TIBC, IRON, RETICCTPCT in the last 72 hours. Urinalysis    Component Value Date/Time   COLORURINE YELLOW 02/24/2016 1649   APPEARANCEUR HAZY (A) 02/24/2016 1649   LABSPEC >1.046 (H) 02/24/2016 1649   PHURINE 6.0 02/24/2016 1649   GLUCOSEU NEGATIVE 02/24/2016 1649   HGBUR NEGATIVE 02/24/2016 1649   BILIRUBINUR SMALL (A) 02/24/2016 1649   KETONESUR NEGATIVE 02/24/2016 1649   PROTEINUR 100 (A) 02/24/2016 1649   UROBILINOGEN 1.0 11/18/2014 1541   NITRITE NEGATIVE 02/24/2016 1649   LEUKOCYTESUR NEGATIVE  02/24/2016 1649   Sepsis Labs Invalid input(s): PROCALCITONIN,  WBC,  LACTICIDVEN Microbiology No results found for this or any previous visit (from the past 240 hour(s)).  Time coordinating discharge: Over 30 minutes  Karen Junkeryan Raiden Yearwood, [name]  [facility] 02/28/2016, 10:05 AM Pager [phone]  If 7PM-7AM, please contact night-coverage [email or web address] Password [email or web address]

## 2016-04-16 ENCOUNTER — Other Ambulatory Visit: Payer: Medicare Other

## 2016-04-21 ENCOUNTER — Telehealth: Payer: Self-pay | Admitting: Cardiology

## 2016-04-21 DIAGNOSIS — I10 Essential (primary) hypertension: Secondary | ICD-10-CM

## 2016-04-21 DIAGNOSIS — I4891 Unspecified atrial fibrillation: Secondary | ICD-10-CM

## 2016-04-21 DIAGNOSIS — I739 Peripheral vascular disease, unspecified: Secondary | ICD-10-CM

## 2016-04-21 DIAGNOSIS — I5032 Chronic diastolic (congestive) heart failure: Secondary | ICD-10-CM

## 2016-04-21 DIAGNOSIS — J441 Chronic obstructive pulmonary disease with (acute) exacerbation: Secondary | ICD-10-CM

## 2016-04-21 DIAGNOSIS — I25119 Atherosclerotic heart disease of native coronary artery with unspecified angina pectoris: Secondary | ICD-10-CM

## 2016-04-21 NOTE — Telephone Encounter (Signed)
Routing to Dr. Delton SeeNelson for advice

## 2016-04-21 NOTE — Telephone Encounter (Signed)
Karen ShengKeisha [name] [place] in reference to the medication Digoxin (0.5) that Ms. Mckenny currently takes, wants to know if Dr. Delton SeeNelson wants to increase the dosage or keep the same. If Dr. Delton SeeNelson keeps it the same, patient also needs a refill. Pharmacy: [facility] [phone]). Please contact Karen at [phone]). Thanks.

## 2016-04-22 MED ORDER — DIGOXIN 62.5 MCG PO TABS
0.0625 mg | ORAL_TABLET | Freq: Every day | ORAL | 3 refills | Status: AC
Start: 2016-04-22 — End: ?

## 2016-04-22 NOTE — Telephone Encounter (Signed)
She is supposed to be taking half of 0.125 mg = 0.0625 mg daily. Tobias AlexanderKatarina Journe Hallmark

## 2016-04-22 NOTE — Telephone Encounter (Signed)
Notified Keisha from [facility] [place], and informed her of Dr Lindaann SloughNelson's recommendations for the pt to continue taking Digoxin 0.0625 mg po daily.  [facility] month supply with 3 refills to be sent to the pts pharmacy, [facility].  Informed Deanna ArtisKeisha that I will send this now and will be available for pick-up today.  Did clarify with Deanna ArtisKeisha that per Dr Delton SeeNelson, the pt should be taking 1/2 of 0.125 mg of dig, to = 0.0625 mg po daily.  Deanna ArtisKeisha verbalized understanding and agrees with this plan.

## 2016-05-28 DEATH — deceased

## 2016-06-27 IMAGING — MR MR ABDOMEN W/O CM
8 of 12 series · 20 of 48 positions shown · non-contrast
Comparison: CT abdomen/pelvis 11/18/2014 and 05/04/2014, chest CT
with incomplete upper abdominal imaging 09/25/2010

CLINICAL DATA: Elevated liver function tests. Remote history of
breast cancer. Question of duodenal or pancreatic mass on prior
exam.

EXAM:
MRI ABDOMEN WITHOUT CONTRAST
TECHNIQUE: Multiplanar multisequence MR imaging was performed without the
administration of intravenous contrast.

[Series 3: T2 · axial · 5.0mm · 0.78mm/px · z∈[-87,+128]mm · 3 of 44 slices shown (1 of 2)]
[im 1/44]
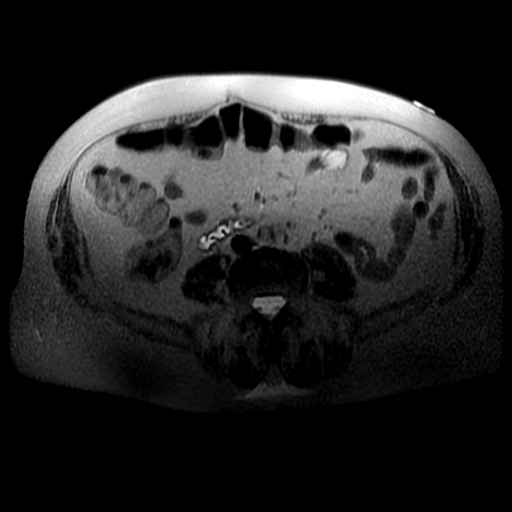
[im 22/44]
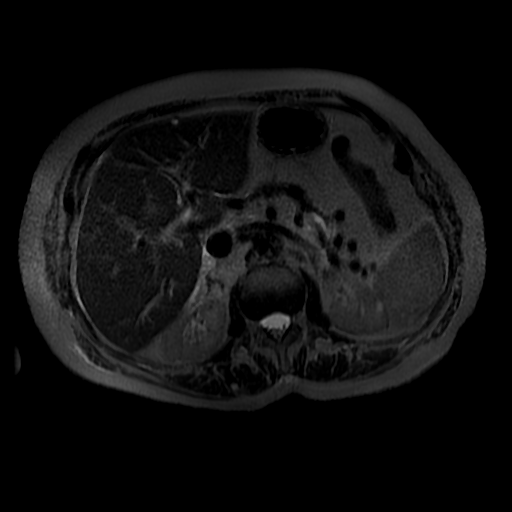
[im 44/44]
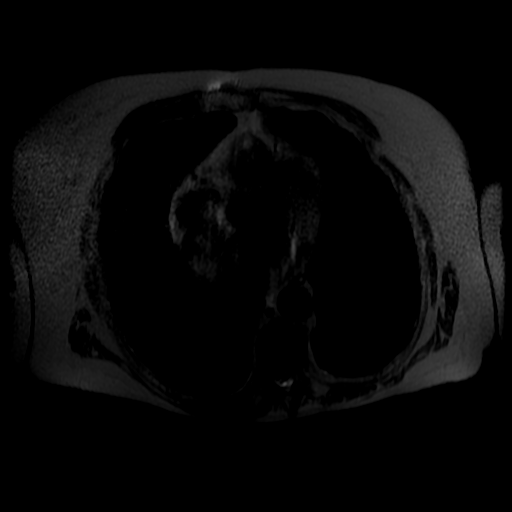

[Series 4: T2 · coronal · 5.0mm · 0.78mm/px · 2 of 43 slices shown (2 of 2)]
[im 1/43]
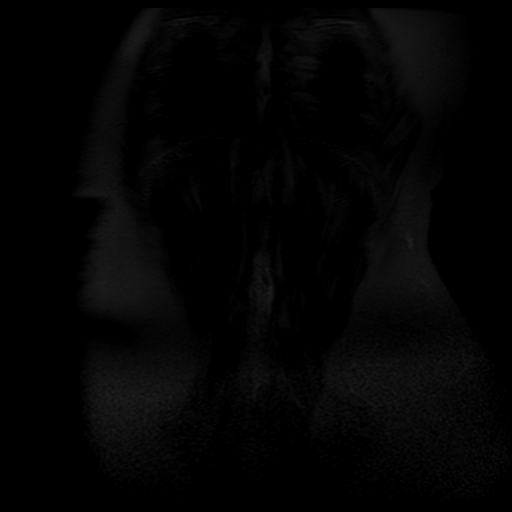
[im 43/43]
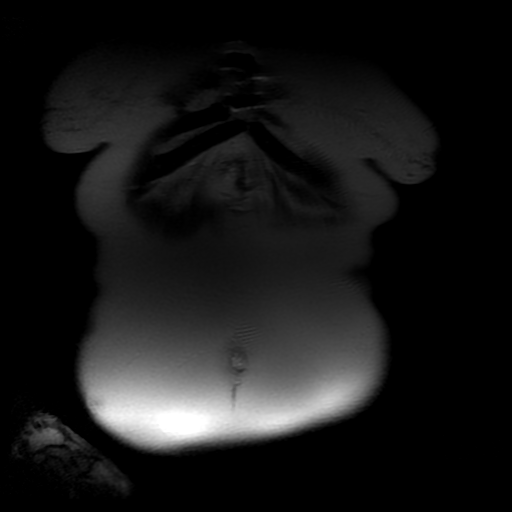

[Series 5: T2 fat-sat · axial · 5.0mm · 0.78mm/px · z∈[-82,+123]mm · 2 of 42 slices shown (1 of 2)]
[im 1/42]
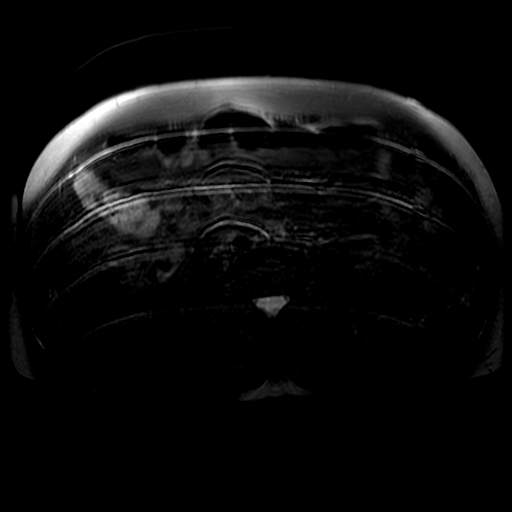
[im 42/42]
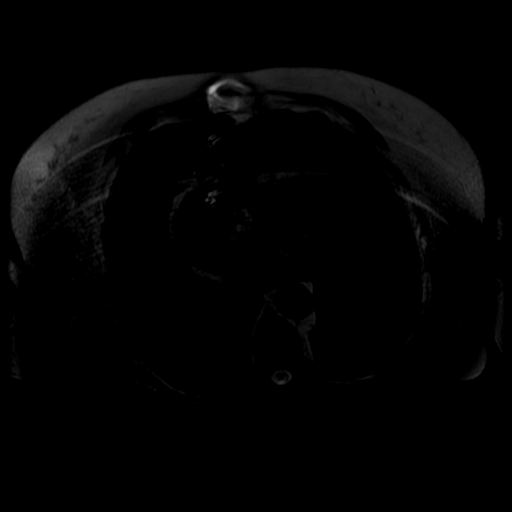

[Series 6: DWI b500 · axial · 6.0mm · 1.56mm/px · z∈[-109,+133]mm · 3 of 63 slices shown]
[im 1/63]
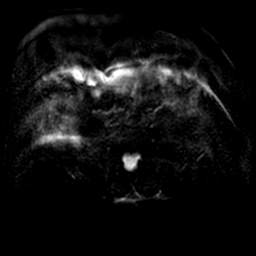
[im 32/63]
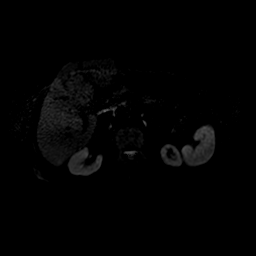
[im 63/63]
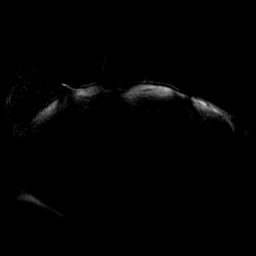

[Series 7: T2 fat-sat · axial · 5.0mm · 0.78mm/px · 1 of 22 slices shown (2 of 2)]
[im 1/22]
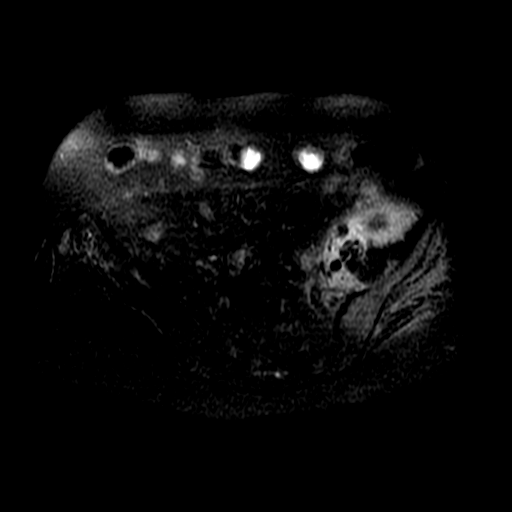

[Series 9: bSSFP · axial · 6.0mm · 0.86mm/px · z∈[-106,+125]mm · 2 of 34 slices shown]
[im 1/34]
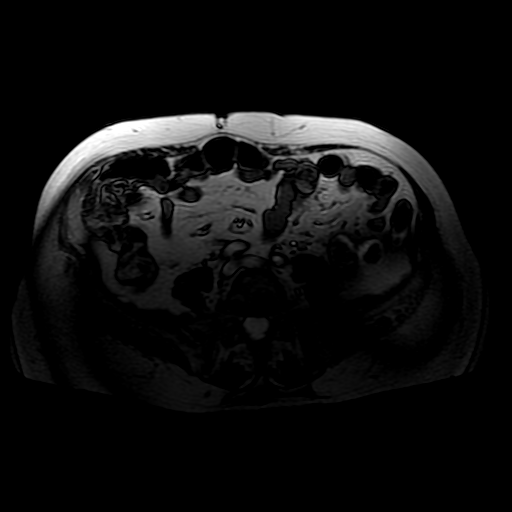
[im 34/34]
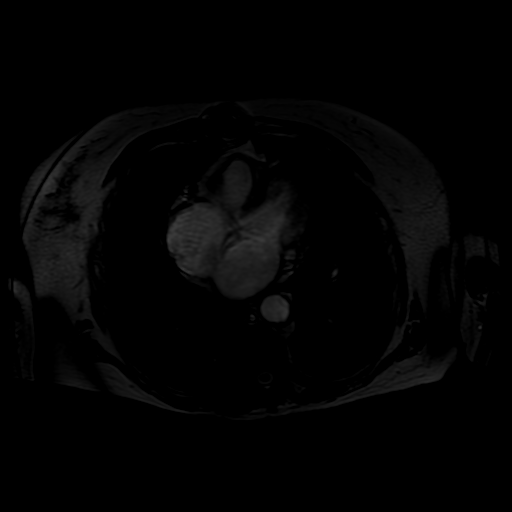

[Series 13: ax dualecho · axial · 5.0mm · 0.82mm/px · z∈[-101,+114]mm · 4 of 88 slices shown]
[im 1/88]
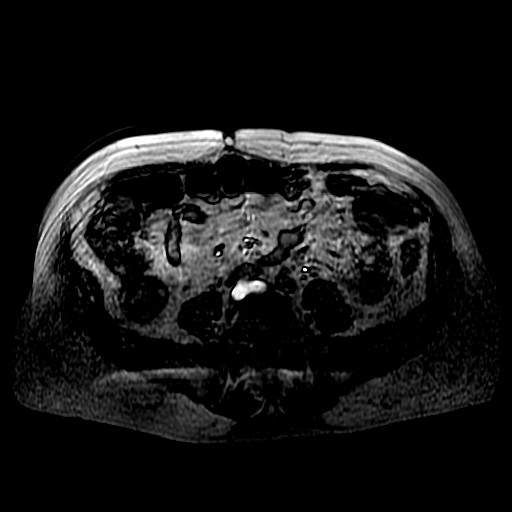
[im 30/88]
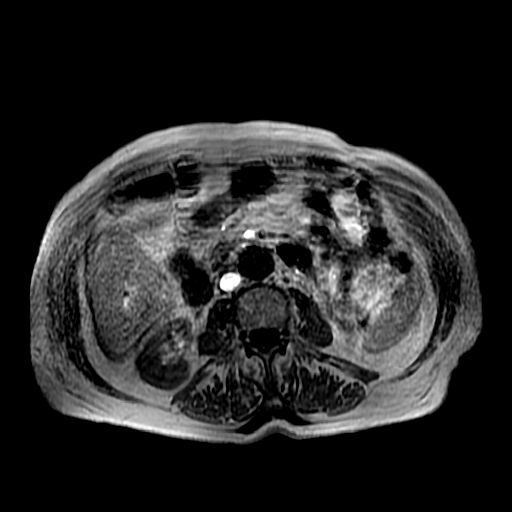
[im 59/88]
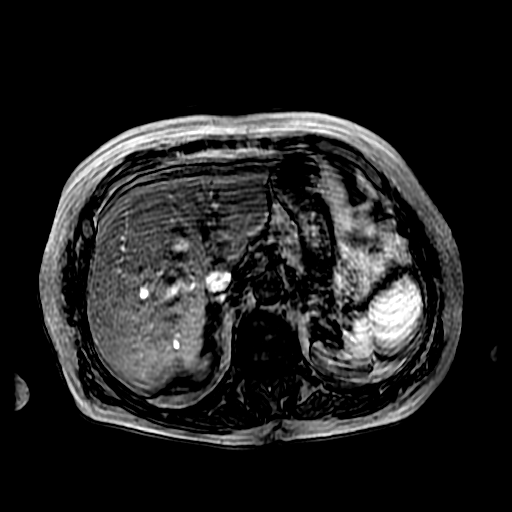
[im 88/88]
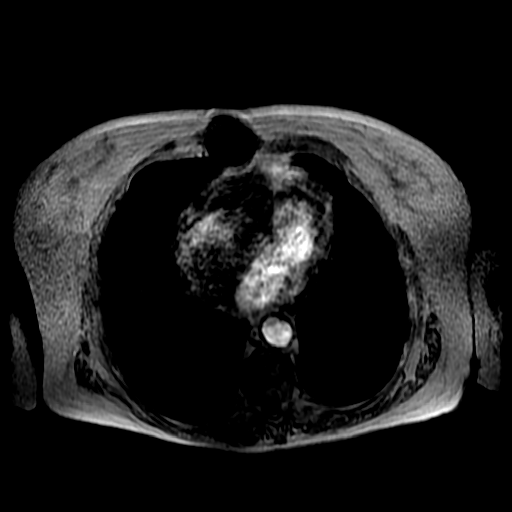

[Series 803: processed images · axial · 0.6mm · 0.62mm/px · z∈[-57,-23]mm · 3 of 147 slices shown]
[im 1/147]
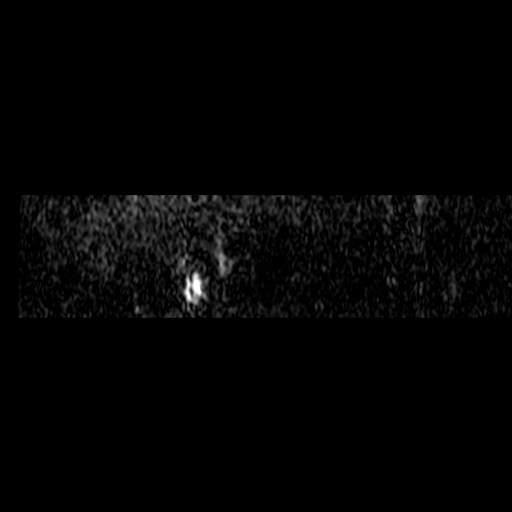
[im 25/147]
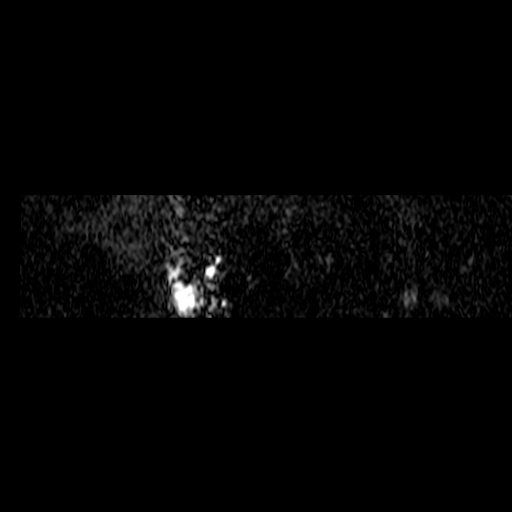
[im 49/147]
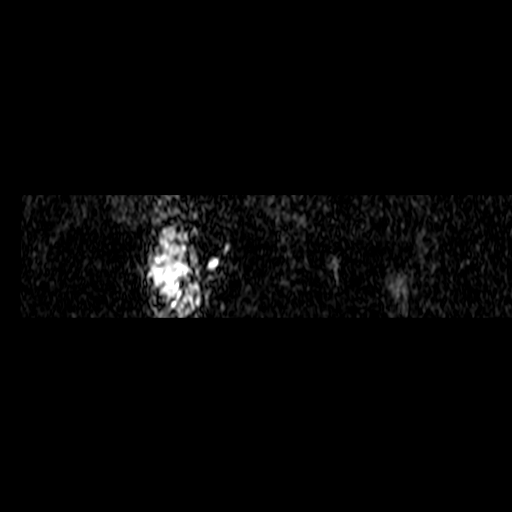

[20 of 48 positions shown; findings below may reference images not displayed]

FINDINGS: Contrast was not administered because the patient was unable to
remain still and requested that the examination be terminated. There
is significant motion artifact on multiple imaging sequences.

There is mild central intrahepatic ductal dilatation and mild
periportal edema. Several too small to characterize hepatic T2
hyperintense lesions are most likely cysts. There is trace porta
hepatis stranding/fluid but no definable collection is identified.
Again noted is suggestion of irregular soft tissue at the porta
hepatis just inferior to the portal vein but this cannot be measured
with accuracy due to motion artifact on the current exam.

The common duct is normal in caliber and no filling defect is
identified.

The pancreas is unremarkable without pancreatic ductal dilatation.

Adrenal glands are normal. Probable subcentimeter left renal
cortical cyst. No hydronephrosis.

No measurable lymphadenopathy or aortic aneurysm. Artifact from
sternal wires partly visualized.
IMPRESSION: Minimal periportal edema and central mild intrahepatic ductal
dilatation. No hepatic mass is identified. Evaluation is limited by
patient motion and lack of ability to administer contrast. There is
a persisting question of soft tissue mass within the porta hepatis
inferior to the portal vein which could be a lymph node or mass
producing minimal central obstruction. Other etiologies could
include stricture, occult stone, or bile duct mass not visualized.
Groove pancreatitis is felt less likely because the patient
reportedly has normal pancreatic enzymes at recent lab values.

No gross evidence for pancreatic mass.

Findings discussed in exam reviewed with Dr. Zwariowana by Dr. Pelyte on

## 2016-06-28 IMAGING — CR DG CHEST 1V PORT
2 series · 2 of 2 positions shown · non-contrast
Comparison: Portable chest x-ray November 17, 2014

CLINICAL DATA: Shortness of breath, history of COPD on home oxygen,
history of CHF and CABG, previous tobacco use.

EXAM:
PORTABLE CHEST - 1 VIEW

[AP (1 of 2)]
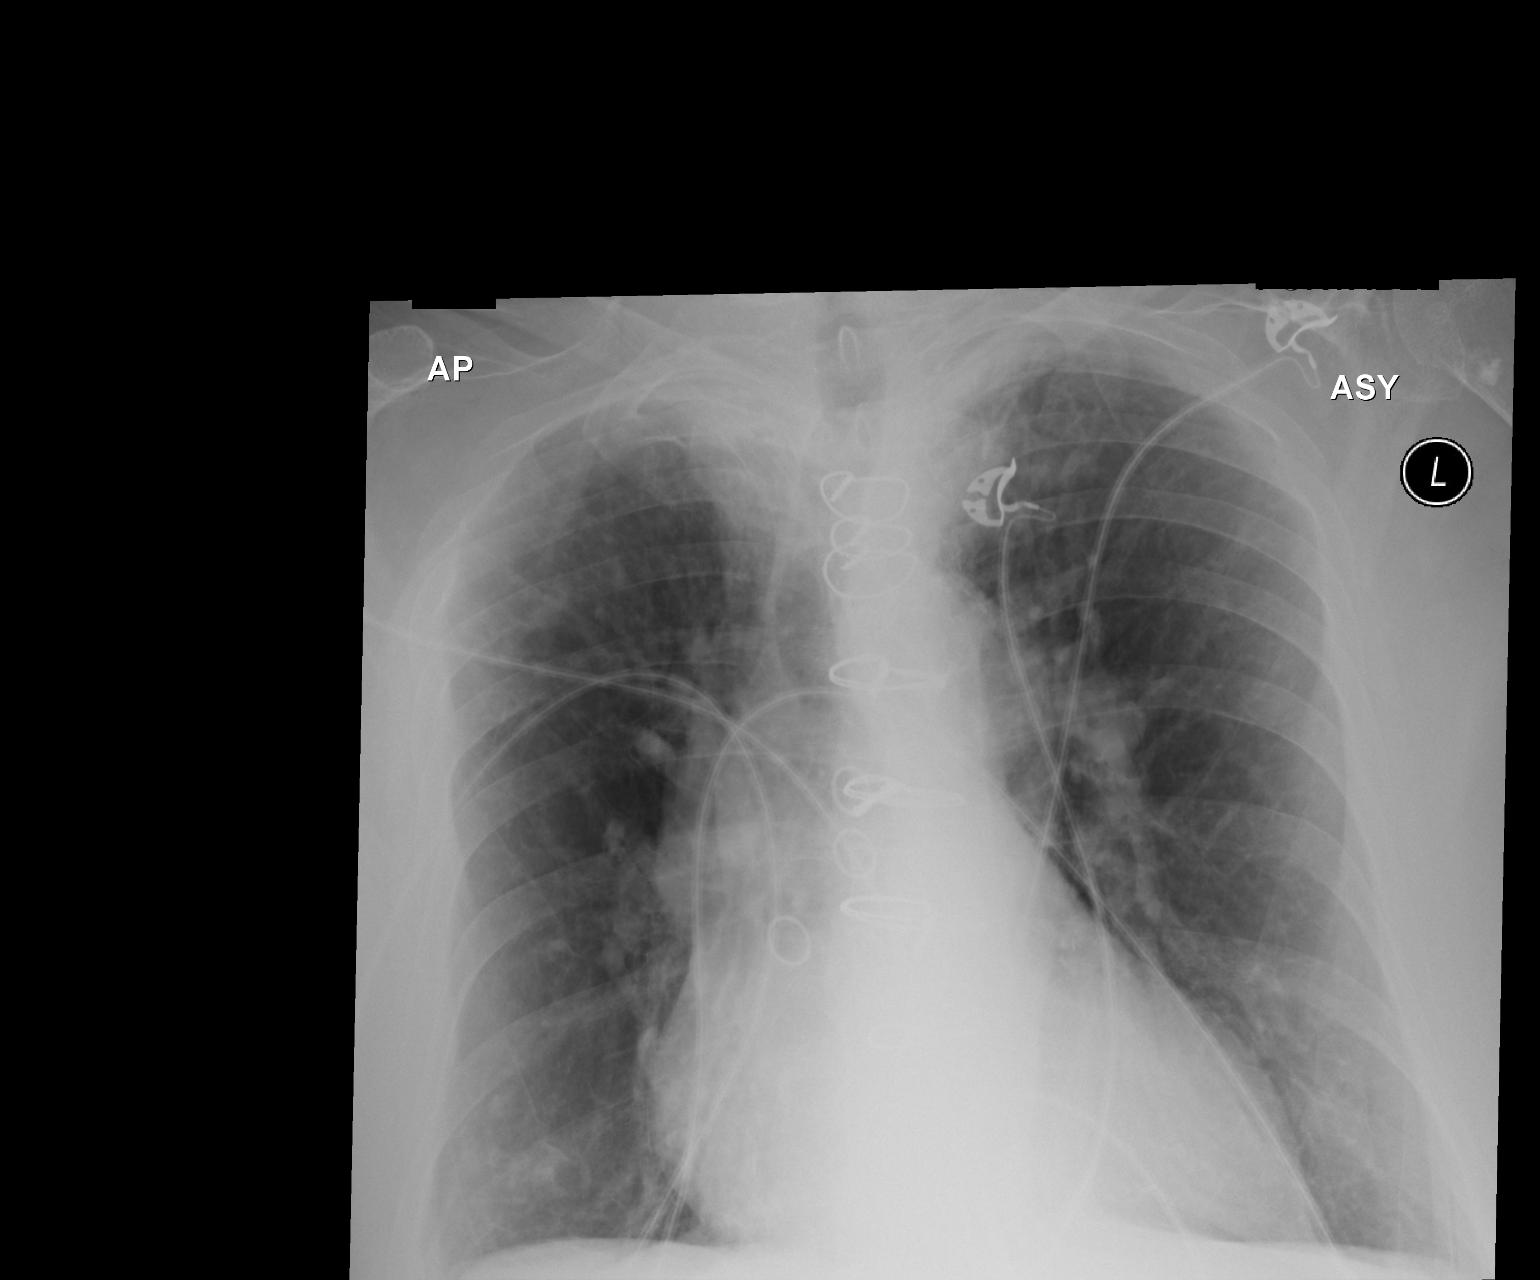

[AP (2 of 2)]
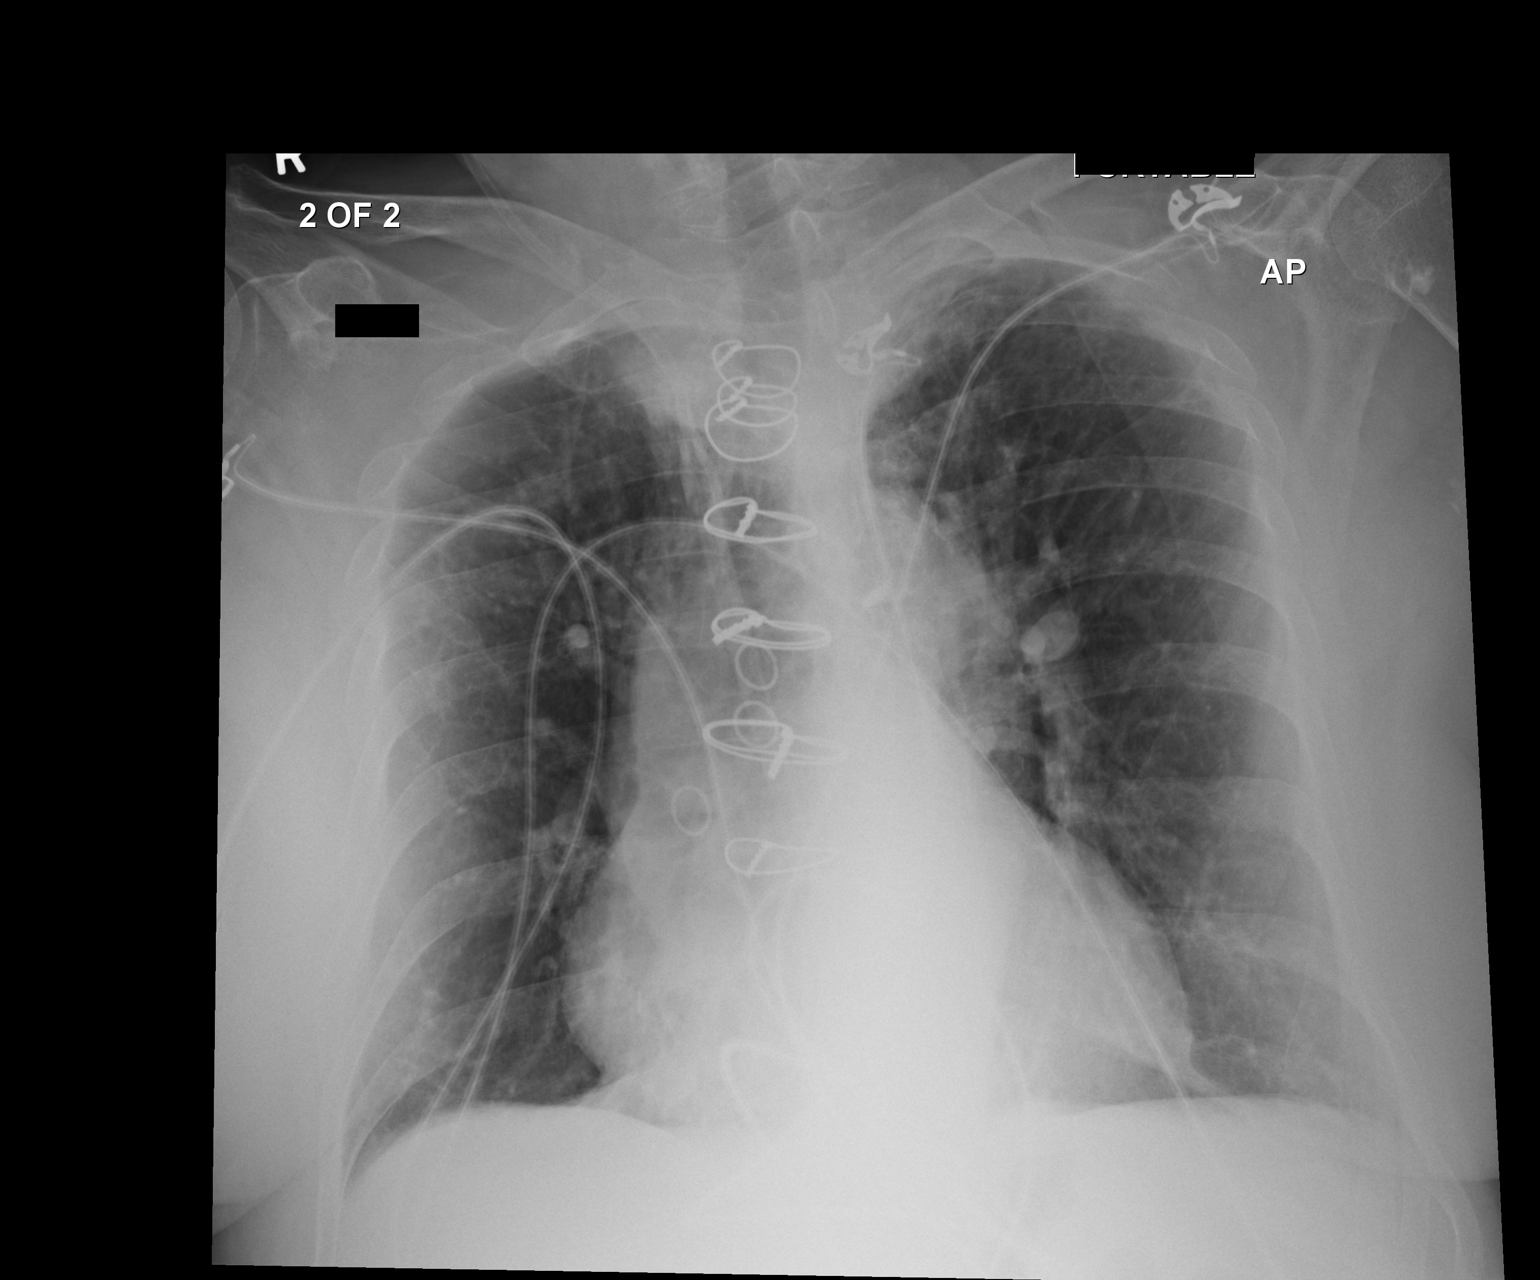

[2 of 2 positions shown; findings below may reference images not displayed]

FINDINGS: The lungs remain hyperinflated. There is no focal infiltrate. The
cardiac silhouette remains enlarged. The pulmonary vascularity is
prominent centrally. There is no pulmonary interstitial edema. There
are post CABG changes. There is no pleural effusion. The bony thorax
exhibits no acute abnormality.
IMPRESSION: COPD with low-grade compensated CHF. There is no pulmonary edema or
pneumonia.

## 2017-07-27 IMAGING — CR DG CHEST 1V PORT
1 series · 1 of 1 positions shown · non-contrast
Comparison: Chest radiographs 12/16/2015.

CLINICAL DATA: Shortness of breath tonight.

EXAM:
PORTABLE CHEST 1 VIEW

[AP]
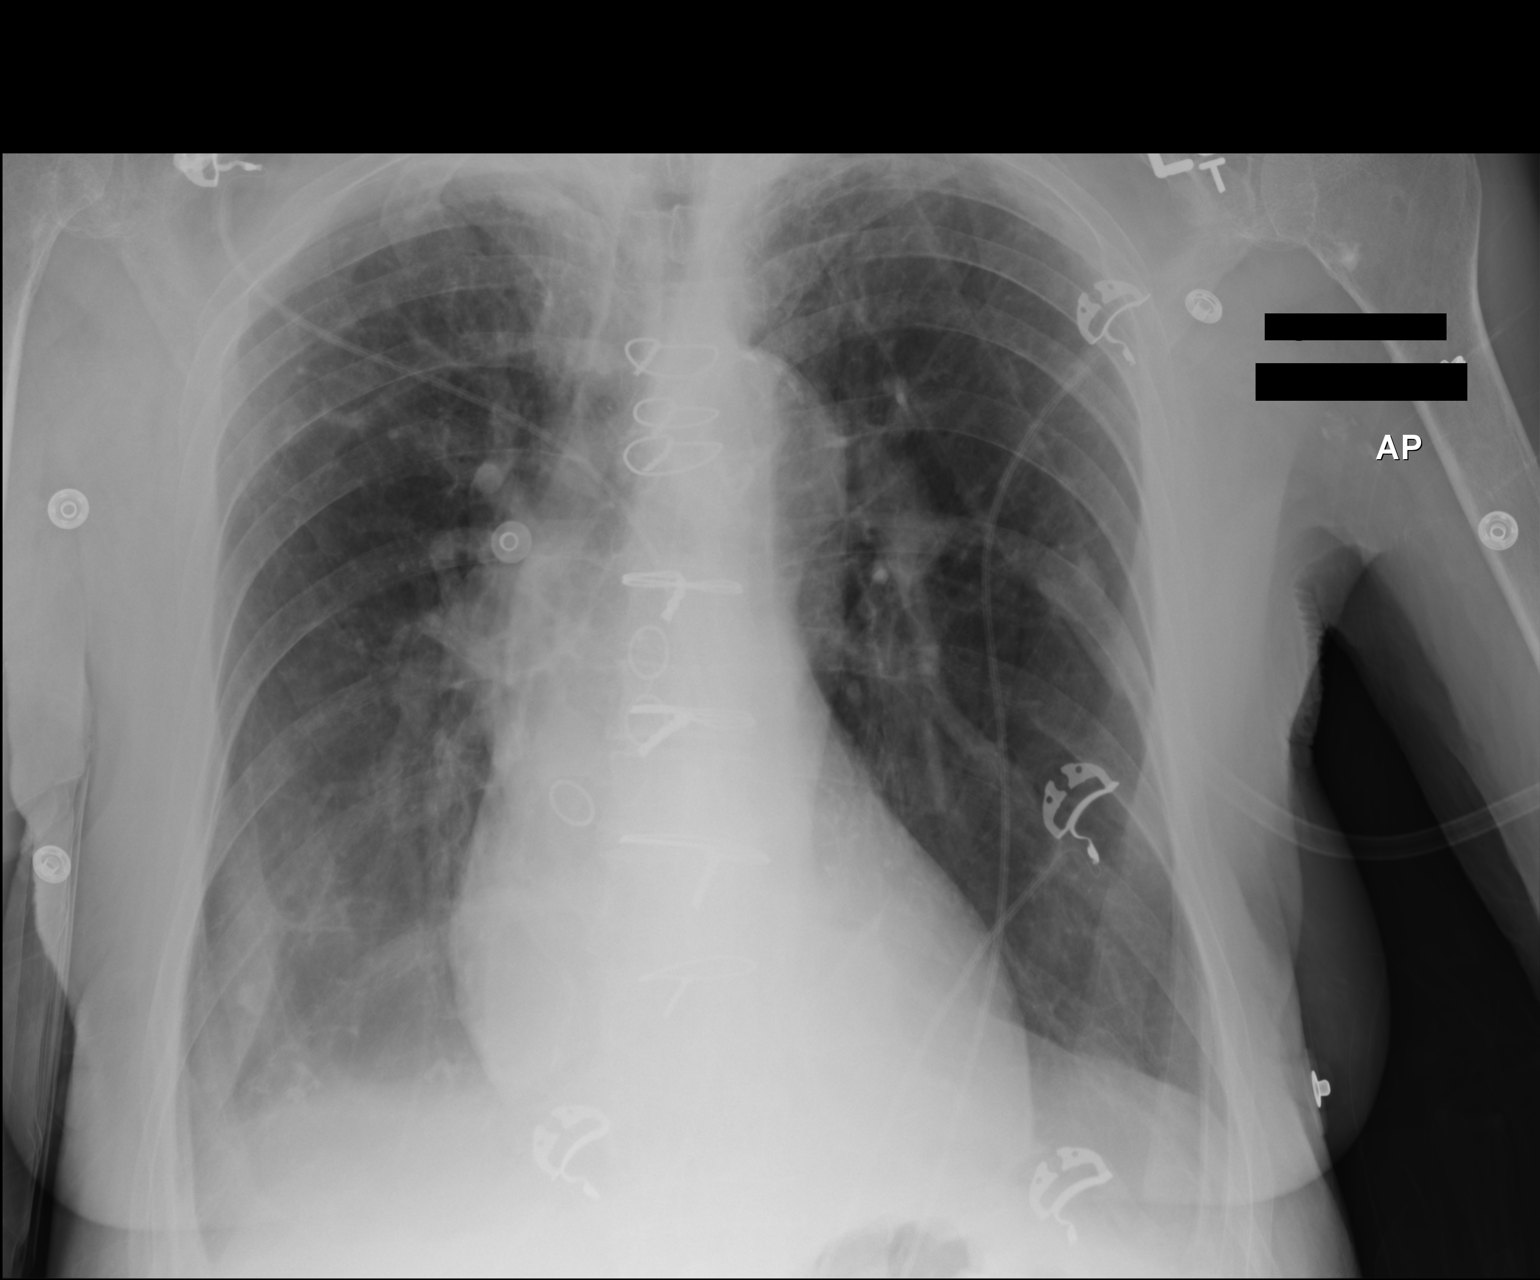

[1 of 1 positions shown; findings below may reference images not displayed]

FINDINGS: Patient is post median sternotomy. Cardiomegaly with tortuous
atherosclerotic thoracic aorta, unchanged. Vascular congestion and
cephalization of pulmonary vasculature, again seen. Bilateral
pleural effusions, possible increase on the right. Associated hazy
opacity at the lung bases likely atelectasis. Right upper lobe
calcified granulomas versus scarring. No new move
IMPRESSION: Bilateral pleural effusions, with increased pleural fluid on the
right.

Mild cardiomegaly and vascular congestion, stable.
